# Patient Record
Sex: Female | Born: 1960
Health system: Southern US, Community
[De-identification: ages and names within clinical notes are randomized; demographics above are authoritative.]

## PROBLEM LIST (undated history)

## (undated) DIAGNOSIS — M1990 Unspecified osteoarthritis, unspecified site: Secondary | ICD-10-CM

## (undated) DIAGNOSIS — Z9889 Other specified postprocedural states: Secondary | ICD-10-CM

## (undated) DIAGNOSIS — F329 Major depressive disorder, single episode, unspecified: Secondary | ICD-10-CM

## (undated) DIAGNOSIS — T50912A Poisoning by multiple unspecified drugs, medicaments and biological substances, intentional self-harm, initial encounter: Secondary | ICD-10-CM

## (undated) DIAGNOSIS — R112 Nausea with vomiting, unspecified: Secondary | ICD-10-CM

## (undated) DIAGNOSIS — J449 Chronic obstructive pulmonary disease, unspecified: Secondary | ICD-10-CM

## (undated) DIAGNOSIS — F32A Depression, unspecified: Secondary | ICD-10-CM

## (undated) DIAGNOSIS — T4145XA Adverse effect of unspecified anesthetic, initial encounter: Secondary | ICD-10-CM

## (undated) DIAGNOSIS — T8859XA Other complications of anesthesia, initial encounter: Secondary | ICD-10-CM

## (undated) DIAGNOSIS — F1011 Alcohol abuse, in remission: Secondary | ICD-10-CM

## (undated) DIAGNOSIS — F419 Anxiety disorder, unspecified: Secondary | ICD-10-CM

## (undated) DIAGNOSIS — K219 Gastro-esophageal reflux disease without esophagitis: Secondary | ICD-10-CM

## (undated) DIAGNOSIS — IMO0001 Reserved for inherently not codable concepts without codable children: Secondary | ICD-10-CM

## (undated) DIAGNOSIS — G610 Guillain-Barre syndrome: Secondary | ICD-10-CM

## (undated) DIAGNOSIS — I10 Essential (primary) hypertension: Secondary | ICD-10-CM

## (undated) HISTORY — PX: TUBAL LIGATION: SHX77

## (undated) HISTORY — PX: TONSILLECTOMY: SUR1361

## (undated) HISTORY — PX: LAPAROSCOPY: SHX197

## (undated) HISTORY — DX: Essential (primary) hypertension: I10

## (undated) HISTORY — DX: Unspecified osteoarthritis, unspecified site: M19.90

## (undated) HISTORY — DX: Chronic obstructive pulmonary disease, unspecified: J44.9

## (undated) HISTORY — DX: Gastro-esophageal reflux disease without esophagitis: K21.9

## (undated) HISTORY — PX: OTHER SURGICAL HISTORY: SHX169

## (undated) HISTORY — DX: Reserved for inherently not codable concepts without codable children: IMO0001

---

## 1997-09-30 ENCOUNTER — Ambulatory Visit (HOSPITAL_BASED_OUTPATIENT_CLINIC_OR_DEPARTMENT_OTHER): Admission: RE | Admit: 1997-09-30 | Discharge: 1997-09-30 | Payer: Self-pay | Admitting: Surgery

## 1998-05-23 ENCOUNTER — Encounter: Admission: RE | Admit: 1998-05-23 | Discharge: 1998-06-19 | Payer: Self-pay | Admitting: Family Medicine

## 1999-02-02 ENCOUNTER — Emergency Department (HOSPITAL_COMMUNITY): Admission: EM | Admit: 1999-02-02 | Discharge: 1999-02-02 | Payer: Self-pay | Admitting: Emergency Medicine

## 2004-03-05 ENCOUNTER — Encounter: Admission: RE | Admit: 2004-03-05 | Discharge: 2004-03-05 | Payer: Self-pay | Admitting: Family Medicine

## 2008-03-06 ENCOUNTER — Encounter: Admission: RE | Admit: 2008-03-06 | Discharge: 2008-03-06 | Payer: Self-pay | Admitting: Internal Medicine

## 2009-02-24 ENCOUNTER — Emergency Department (HOSPITAL_COMMUNITY): Admission: EM | Admit: 2009-02-24 | Discharge: 2009-02-24 | Payer: Self-pay | Admitting: Family Medicine

## 2009-09-28 ENCOUNTER — Emergency Department (HOSPITAL_COMMUNITY): Admission: EM | Admit: 2009-09-28 | Discharge: 2009-09-29 | Payer: Self-pay | Admitting: Emergency Medicine

## 2009-09-29 ENCOUNTER — Ambulatory Visit: Payer: Self-pay | Admitting: Psychiatry

## 2009-09-29 ENCOUNTER — Inpatient Hospital Stay (HOSPITAL_COMMUNITY): Admission: RE | Admit: 2009-09-29 | Discharge: 2009-10-05 | Payer: Self-pay | Admitting: Psychiatry

## 2010-04-02 ENCOUNTER — Emergency Department (HOSPITAL_COMMUNITY): Admission: EM | Admit: 2010-04-02 | Discharge: 2010-04-02 | Payer: Self-pay | Admitting: Emergency Medicine

## 2010-06-07 ENCOUNTER — Emergency Department (HOSPITAL_COMMUNITY)
Admission: EM | Admit: 2010-06-07 | Discharge: 2010-06-07 | Payer: Self-pay | Source: Home / Self Care | Admitting: Emergency Medicine

## 2010-06-16 ENCOUNTER — Emergency Department (HOSPITAL_COMMUNITY)
Admission: EM | Admit: 2010-06-16 | Discharge: 2010-06-16 | Payer: Self-pay | Source: Home / Self Care | Admitting: Emergency Medicine

## 2010-06-20 ENCOUNTER — Emergency Department (HOSPITAL_COMMUNITY)
Admission: EM | Admit: 2010-06-20 | Discharge: 2010-06-20 | Payer: Self-pay | Source: Home / Self Care | Admitting: Emergency Medicine

## 2010-09-09 LAB — DIFFERENTIAL
Basophils Relative: 1 % (ref 0–1)
Lymphs Abs: 2 10*3/uL (ref 0.7–4.0)
Monocytes Absolute: 0.8 10*3/uL (ref 0.1–1.0)
Monocytes Relative: 11 % (ref 3–12)
Neutro Abs: 3.7 10*3/uL (ref 1.7–7.7)
Neutrophils Relative %: 56 % (ref 43–77)

## 2010-09-09 LAB — CBC
MCHC: 33.8 g/dL (ref 30.0–36.0)
Platelets: 247 10*3/uL (ref 150–400)
RDW: 12.5 % (ref 11.5–15.5)

## 2010-09-09 LAB — RAPID URINE DRUG SCREEN, HOSP PERFORMED
Amphetamines: NOT DETECTED
Benzodiazepines: POSITIVE — AB
Cocaine: POSITIVE — AB

## 2010-09-09 LAB — URINALYSIS, ROUTINE W REFLEX MICROSCOPIC
Glucose, UA: NEGATIVE mg/dL
Hgb urine dipstick: NEGATIVE
Protein, ur: NEGATIVE mg/dL
pH: 6 (ref 5.0–8.0)

## 2010-09-09 LAB — COMPREHENSIVE METABOLIC PANEL
ALT: 15 U/L (ref 0–35)
Albumin: 3.9 g/dL (ref 3.5–5.2)
Alkaline Phosphatase: 88 U/L (ref 39–117)
BUN: 3 mg/dL — ABNORMAL LOW (ref 6–23)
Calcium: 8.9 mg/dL (ref 8.4–10.5)
Potassium: 3.6 mEq/L (ref 3.5–5.1)
Sodium: 135 mEq/L (ref 135–145)
Total Protein: 7.3 g/dL (ref 6.0–8.3)

## 2010-09-09 LAB — ETHANOL: Alcohol, Ethyl (B): 140 mg/dL — ABNORMAL HIGH (ref 0–10)

## 2010-09-09 LAB — URINE MICROSCOPIC-ADD ON

## 2010-09-09 LAB — ACETAMINOPHEN LEVEL: Acetaminophen (Tylenol), Serum: <10 ug/mL — ABNORMAL LOW (ref 10–30)

## 2011-02-19 ENCOUNTER — Other Ambulatory Visit: Payer: Self-pay | Admitting: Obstetrics & Gynecology

## 2011-02-19 DIAGNOSIS — Z1231 Encounter for screening mammogram for malignant neoplasm of breast: Secondary | ICD-10-CM

## 2011-02-23 ENCOUNTER — Other Ambulatory Visit: Payer: Self-pay | Admitting: Obstetrics and Gynecology

## 2011-02-23 ENCOUNTER — Ambulatory Visit (INDEPENDENT_AMBULATORY_CARE_PROVIDER_SITE_OTHER): Payer: Self-pay | Admitting: *Deleted

## 2011-02-23 ENCOUNTER — Ambulatory Visit (HOSPITAL_COMMUNITY): Payer: Self-pay

## 2011-02-23 ENCOUNTER — Encounter (HOSPITAL_COMMUNITY): Payer: Self-pay

## 2011-02-23 VITALS — BP 130/80 | HR 92 | Temp 97.9°F | Resp 20 | Ht 67.0 in | Wt 194.1 lb

## 2011-02-23 DIAGNOSIS — N63 Unspecified lump in unspecified breast: Secondary | ICD-10-CM

## 2011-02-23 DIAGNOSIS — Z01419 Encounter for gynecological examination (general) (routine) without abnormal findings: Secondary | ICD-10-CM

## 2011-02-23 DIAGNOSIS — N6323 Unspecified lump in the left breast, lower outer quadrant: Secondary | ICD-10-CM

## 2011-02-23 DIAGNOSIS — N949 Unspecified condition associated with female genital organs and menstrual cycle: Secondary | ICD-10-CM

## 2011-02-23 DIAGNOSIS — R350 Frequency of micturition: Secondary | ICD-10-CM

## 2011-02-23 DIAGNOSIS — N644 Mastodynia: Secondary | ICD-10-CM

## 2011-02-23 DIAGNOSIS — R1031 Right lower quadrant pain: Secondary | ICD-10-CM

## 2011-02-23 DIAGNOSIS — R102 Pelvic and perineal pain: Secondary | ICD-10-CM

## 2011-02-23 DIAGNOSIS — Z8742 Personal history of other diseases of the female genital tract: Secondary | ICD-10-CM

## 2011-02-23 DIAGNOSIS — N6313 Unspecified lump in the right breast, lower outer quadrant: Secondary | ICD-10-CM

## 2011-02-23 LAB — POCT URINALYSIS DIP (DEVICE)
Bilirubin Urine: NEGATIVE
Glucose, UA: NEGATIVE mg/dL
Hgb urine dipstick: NEGATIVE
Nitrite: NEGATIVE
Specific Gravity, Urine: 1.02 (ref 1.005–1.030)

## 2011-02-23 NOTE — Progress Notes (Signed)
Patient c/o RLQ abdominal pain and tenderness. Patient c/o feeling lump in right breast and pain. Patient c/o of pain on urination and increase in frequency of urination. Patient c/o pain during intercourse and hasn't had intercourse in 1 year.  Pap Smear:    Pap Smear Completed today.  Last Pap in 2008. History of abnormal Pap Smear in past that required a repeat Pap Smear.   Physical exam: Breasts      Patients breast appeared to be symmetric. Left breast slightly larger which is normal per patient. No skin abnormalities and no nipple retraction seen on exam. No lymphadenopathy noted on exam. No nipple discharge on exam or per patient. Did palpate a small pea sized area around 4 o'clock in left breast and felt small slightly smaller than a pea in right breast around 7 o'clock. Pain and tenderness was stated when palpating right breast. Patient has a family history of breast cancer. Mother was diagnosed in her 42's and aunt in her 67's. [facility], [facility] for 3:20pm to follow up. Patient has never had a mammogram and only does BSE occasionally.      Pelvic/Bimanual   Ext Genitalia No lesions, No swelling and No discharge observed on exam.        Vagina      Vaginal color pink and of normal texture. No lesions or discharge observed on exam.    Cervix Cervix present and pink. Cervix normal texture.          Uterus Uterus present and within normal size on palpation.         Adnexae      Ovaries Present and tenderness noted on right side on palpation. Patient c/o history of right ovarian cysts. Patient states she has shooting pains on the RLQ. Ovaries not palpated. Ultrasound is scheduled for Friday, February 26, 2011 at 10:30am for follow up. Will schedule patient follow up appointment with one of the physicians from [facility] to follow up.    Rectovaginal  Noted hemorrhoids on exam. No rectal exam  completed.  U/A completed and trace of leukocytes and trace of ketones seen. Result shown to Dr. Penne Lash. No further follow up is needed for urinary complaints.

## 2011-02-23 NOTE — Patient Instructions (Signed)
Education material given on how to do self breast exam and about Pap results.  Patient scheduled for diagnostic mammogram at the [facility] for February 25, 2011 for 3:20pm related to small lump found in right and left breast. Ultrasound will be done if needed.   U/A completed on patient related to c/o burning when urinating and frequent urination. Patient given result WNL.  U/S scheduled for Friday, February 26, 2011 at 10:30am to follow up for c/o RLQ pain and history of ovarian cysts.  Patient given appointments and all above information and verbalized understanding.

## 2011-02-25 ENCOUNTER — Ambulatory Visit
Admission: RE | Admit: 2011-02-25 | Discharge: 2011-02-25 | Disposition: A | Discharge status: Home / Self Care | Payer: No Typology Code available for payment source | Source: Ambulatory Visit | Attending: Obstetrics and Gynecology | Admitting: Obstetrics and Gynecology

## 2011-02-25 DIAGNOSIS — N644 Mastodynia: Secondary | ICD-10-CM

## 2011-02-25 DIAGNOSIS — N63 Unspecified lump in unspecified breast: Secondary | ICD-10-CM

## 2011-02-26 ENCOUNTER — Ambulatory Visit (HOSPITAL_COMMUNITY): Admission: RE | Admit: 2011-02-26 | Payer: Self-pay | Source: Ambulatory Visit

## 2011-03-01 ENCOUNTER — Ambulatory Visit (HOSPITAL_COMMUNITY)
Admission: RE | Admit: 2011-03-01 | Discharge: 2011-03-01 | Disposition: A | Discharge status: Home / Self Care | Payer: Self-pay | Source: Ambulatory Visit | Attending: Obstetrics and Gynecology | Admitting: Obstetrics and Gynecology

## 2011-03-01 DIAGNOSIS — Z8742 Personal history of other diseases of the female genital tract: Secondary | ICD-10-CM

## 2011-03-01 DIAGNOSIS — R1031 Right lower quadrant pain: Secondary | ICD-10-CM | POA: Insufficient documentation

## 2011-03-03 ENCOUNTER — Telehealth: Payer: Self-pay | Admitting: *Deleted

## 2011-03-05 NOTE — Telephone Encounter (Signed)
Called patient 03/03/11 at 12:15pm and gave patient results from her ultrasound, mammogram and pap smear. Told patient both her mammogram and pap smear were normal. The lumps palpated on breasts were fatty areas and are normal per the diagnostic mammogram. Told her she will need her next mammogram in 1 year and her next pap smear in 3 years. Per the state guidelines she is good for 3 years. Told patient since she has had an abnormal pap smear in the past she can come to one of our free pap smear screenings to have a pap smear prior to 3 years if she would like to get a pap smear before 3 years. Told her we offer our free pap smear screenings throughout the year at different locations. Talked to patient about her ultrasound result. Her result was normal. Told her I had spoken with Dr. Jolayne Panther and that no further follow up is needed since nothing showed on the ultrasound. Told patient to follow up with her primary care doctor that something other than a GYN problem may be causing the pain. Patient stated she has a history of ovarian cysts and told her she could have had one that ruptured and resolved itself prior to the ultrasound. Advised her that if the pain gets worse or continues that she will need to follow up. If she doesn't have a physician or if the pain gets worse then she can go to the emergency room to follow up. Let patient know if she is still eligible for [facility] to call to schedule an appointment in 1 year. Told patient if she has any questions to feel free to call me. Patient verbalized understanding. Will send patient a letter with results.

## 2011-03-23 ENCOUNTER — Encounter: Payer: Self-pay | Admitting: *Deleted

## 2011-04-02 ENCOUNTER — Encounter: Payer: Self-pay | Admitting: Obstetrics and Gynecology

## 2011-10-31 ENCOUNTER — Emergency Department (HOSPITAL_COMMUNITY): Payer: Self-pay

## 2011-10-31 ENCOUNTER — Emergency Department (HOSPITAL_COMMUNITY)
Admission: EM | Admit: 2011-10-31 | Discharge: 2011-10-31 | Disposition: A | Discharge status: Home / Self Care | Payer: Self-pay | Attending: Emergency Medicine | Admitting: Emergency Medicine

## 2011-10-31 ENCOUNTER — Encounter (HOSPITAL_COMMUNITY): Payer: Self-pay

## 2011-10-31 DIAGNOSIS — F1010 Alcohol abuse, uncomplicated: Secondary | ICD-10-CM | POA: Insufficient documentation

## 2011-10-31 DIAGNOSIS — S8000XA Contusion of unspecified knee, initial encounter: Secondary | ICD-10-CM | POA: Insufficient documentation

## 2011-10-31 DIAGNOSIS — Z79899 Other long term (current) drug therapy: Secondary | ICD-10-CM | POA: Insufficient documentation

## 2011-10-31 DIAGNOSIS — S8002XA Contusion of left knee, initial encounter: Secondary | ICD-10-CM

## 2011-10-31 DIAGNOSIS — F341 Dysthymic disorder: Secondary | ICD-10-CM | POA: Insufficient documentation

## 2011-10-31 DIAGNOSIS — W19XXXA Unspecified fall, initial encounter: Secondary | ICD-10-CM | POA: Insufficient documentation

## 2011-10-31 HISTORY — DX: Depression, unspecified: F32.A

## 2011-10-31 HISTORY — DX: Major depressive disorder, single episode, unspecified: F32.9

## 2011-10-31 HISTORY — DX: Anxiety disorder, unspecified: F41.9

## 2011-10-31 NOTE — ED Notes (Signed)
Per PT[facility]- pt presents with ETOH-  Admits to falling and c/o of left knee pain- denies neck and back pain

## 2011-10-31 NOTE — Discharge Instructions (Signed)
Your x-rays of your knee did not show any broken bones or dislocations. Use rest, ice, compression and elevation to help with swelling and pain your knee. It is recommended that you reduce your alcohol intake. If you feel you need help with your alcohol abuse you may return at any time or use the resource guide below for additional help.   Alcohol Problems Most adults who drink alcohol drink in moderation (not a lot) are at low risk for developing problems related to their drinking. However, all drinkers, including low-risk drinkers, should know about the health risks connected with drinking alcohol. RECOMMENDATIONS FOR LOW-RISK DRINKING  Drink in moderation. Moderate drinking is defined as follows:   Men - no more than 2 drinks per day.   Nonpregnant women - no more than 1 drink per day.   Over age 30 - no more than 1 drink per day.  A standard drink is 12 grams of pure alcohol, which is equal to a 12 ounce bottle of beer or wine cooler, a 5 ounce glass of wine, or 1.5 ounces of distilled spirits (such as whiskey, brandy, vodka, or rum).  ABSTAIN FROM (DO NOT DRINK) ALCOHOL:  When pregnant or considering pregnancy.   When taking a medication that interacts with alcohol.   If you are alcohol dependent.   A medical condition that prohibits drinking alcohol (such as ulcer, liver disease, or heart disease).  DISCUSS WITH YOUR CAREGIVER:  If you are at risk for coronary heart disease, discuss the potential benefits and risks of alcohol use: Light to moderate drinking is associated with lower rates of coronary heart disease in certain populations (for example, men over age 75 and postmenopausal women). Infrequent or nondrinkers are advised not to begin light to moderate drinking to reduce the risk of coronary heart disease so as to avoid creating an alcohol-related problem. Similar protective effects can likely be gained through proper diet and exercise.   Women and the elderly have smaller  amounts of body water than men. As a result women and the elderly achieve a higher blood alcohol concentration after drinking the same amount of alcohol.   Exposing a fetus to alcohol can cause a broad range of birth defects referred to as Fetal Alcohol Syndrome (FAS) or Alcohol-Related Birth Defects (ARBD). Although FAS/ARBD is connected with excessive alcohol consumption during pregnancy, studies also have reported neurobehavioral problems in infants born to mothers reporting drinking an average of 1 drink per day during pregnancy.   Heavier drinking (the consumption of more than 4 drinks per occasion by men and more than 3 drinks per occasion by women) impairs learning (cognitive) and psychomotor functions and increases the risk of alcohol-related problems, including accidents and injuries.  CAGE QUESTIONS:   Have you ever felt that you should Cut down on your drinking?   Have people Annoyed you by criticizing your drinking?   Have you ever felt bad or Guilty about your drinking?   Have you ever had a drink first thing in the morning to steady your nerves or get rid of a hangover (Eye opener)?  If you answered positively to any of these questions: You may be at risk for alcohol-related problems if alcohol consumption is:   Men: Greater than 14 drinks per week or more than 4 drinks per occasion.   Women: Greater than 7 drinks per week or more than 3 drinks per occasion.  Do you or your family have a medical history of alcohol-related problems, such as:  Blackouts.   Sexual dysfunction.   Depression.   Trauma.   Liver dysfunction.   Sleep disorders.   Hypertension.   Chronic abdominal pain.   Has your drinking ever caused you problems, such as problems with your family, problems with your work (or school) performance, or accidents/injuries?   Do you have a compulsion to drink or a preoccupation with drinking?   Do you have poor control or are you unable to stop drinking once  you have started?   Do you have to drink to avoid withdrawal symptoms?   Do you have problems with withdrawal such as tremors, nausea, sweats, or mood disturbances?   Does it take more alcohol than in the past to get you high?   Do you feel a strong urge to drink?   Do you change your plans so that you can have a drink?   Do you ever drink in the morning to relieve the shakes or a hangover?  If you have answered a number of the previous questions positively, it may be time for you to talk to your caregivers, family, and friends and see if they think you have a problem. Alcoholism is a chemical dependency that keeps getting worse and will eventually destroy your health and relationships. Many alcoholics end up dead, impoverished, or in prison. This is often the end result of all chemical dependency.  Do not be discouraged if you are not ready to take action immediately.   Decisions to change behavior often involve up and down desires to change and feeling like you cannot decide.   Try to think more seriously about your drinking behavior.   Think of the reasons to quit.  WHERE TO GO FOR ADDITIONAL INFORMATION   The National Institute on Alcohol Abuse and Alcoholism (NIAAA)www.niaaa.nih.gov   [employer] on Alcoholism and Drug Dependence (NCADD)www.ncadd.org   American Society of Addiction Medicine (ASAM)www.https://anderson-johnson.com/  Document Released: 06/07/2005 Document Revised: 05/27/2011 Document Reviewed: 01/24/2008 [facility] Patient Information 2012 [place], [place].  Contusion A contusion is a deep bruise. Contusions are the result of an injury that caused bleeding under the skin. The contusion may turn blue, purple, or yellow. Minor injuries will give you a painless contusion, but more severe contusions may stay painful and swollen for a few weeks.  CAUSES  A contusion is usually caused by a blow, trauma, or direct force to an area of the body. SYMPTOMS   Swelling and redness of the  injured area.   Bruising of the injured area.   Tenderness and soreness of the injured area.   Pain.  DIAGNOSIS  The diagnosis can be made by taking a history and physical exam. An X-ray, CT scan, or MRI may be needed to determine if there were any associated injuries, such as fractures. TREATMENT  Specific treatment will depend on what area of the body was injured. In general, the best treatment for a contusion is resting, icing, elevating, and applying cold compresses to the injured area. Over-the-counter medicines may also be recommended for pain control. Ask your caregiver what the best treatment is for your contusion. HOME CARE INSTRUCTIONS   Put ice on the injured area.   Put ice in a plastic bag.   Place a towel between your skin and the bag.   Leave the ice on for 15 to 20 minutes, 3 to 4 times a day.   Only take over-the-counter or prescription medicines for pain, discomfort, or fever as directed by your caregiver. Your caregiver may  recommend avoiding anti-inflammatory medicines (aspirin, ibuprofen, and naproxen) for 48 hours because these medicines may increase bruising.   Rest the injured area.   If possible, elevate the injured area to reduce swelling.  SEEK IMMEDIATE MEDICAL CARE IF:   You have increased bruising or swelling.   You have pain that is getting worse.   Your swelling or pain is not relieved with medicines.  MAKE SURE YOU:   Understand these instructions.   Will watch your condition.   Will get help right away if you are not doing well or get worse.  Document Released: 03/17/2005 Document Revised: 05/27/2011 Document Reviewed: 04/12/2011 [facility] Patient Information 2012 [place], [place].     RESOURCE GUIDE  Dental Problems  Patients with Medicaid: [facility] [phone]                                           [phone] Phone:  [phone]                                                   Phone:  [phone]  If unable to pay or uninsured, contact:  [facility] or [facility]. to become qualified for the adult dental clinic.  Chronic Pain Problems Contact [facility]  [phone] Patients need to be referred by their primary care doctor.  Insufficient Money for Medicine Contact [employer] Way:  call "211" or [facility] [phone].  No Primary Care Doctor Call [facility]  [phone] Other agencies that provide inexpensive medical care    [facility]  [phone]    [facility]  [phone]    [facility]  [phone]    [facility]  [phone]    [facility]  [phone]    [facility]  [phone]  Psychological Services [facility]  [phone] [facility]  [phone] [facility] Mental Health   [phone] (emergency services ([phone])  Substance Abuse Resources [facility]  [phone] [facility] [phone] [facility] [phone] Floydene Flock [phone] Residential & Outpatient Substance Abuse Program  ([phone]  Abuse/Neglect [facility] Child Abuse Hotline [phone] [facility] Child Abuse Hotline (574)654-4790 (After Hours)  Emergency Shelter [facility] [phone]  M[facility] ([phone] [facility] [phone]  MRSA Hotline #:   (380)675-2256    [facility] Resources  [facility]     [employer]                          [facility] Dept. [address] [place]                       [address]      [address]  [address]  Cristobal Goldmann Phone:  [phone]                                   Phone:  [phone]                 Phone:  [phone]  [facility] Mental  Health Phone:  [phone]  [facility] Child Abuse Hotline 8596999291 [phone] (After Hours)

## 2011-10-31 NOTE — ED Provider Notes (Signed)
History     CSN: [id number]  Arrival date & time 10/31/11  0105   First MD Initiated Contact with Patient 10/31/11 0325      Chief Complaint  Patient presents with  . Alcohol Intoxication  . Fall  . Knee Pain    HPI  History provided by the patient. History is limited given alcohol intoxication. Patient is a 51 year old female with history of anxiety depression reports heavy alcohol use last evening. Patient presents complaining of persistent left knee pains. She reports falling 2-3 days ago directly on her knee. Since that time she complained of waxing and waning pain to the knee and swelling. Pain swelling is worse with increased use and movements. Patient denies having any additional injury. She denies any other aggravating or alleviating factors. She denies any associated symptoms.     Past Medical History  Diagnosis Date  . Depression   . Anxiety     No past surgical history on file.  No family history on file.  History  Substance Use Topics  . Smoking status: Current Everyday Smoker  . Smokeless tobacco: Not on file  . Alcohol Use: Yes    OB History    Grav Para Term Preterm Abortions TAB SAB Ect Mult Living                  Review of Systems  Unable to perform ROS: Other  HENT: Negative for neck pain.   Respiratory: Negative for cough and shortness of breath.   Musculoskeletal: Positive for joint swelling.  Neurological: Negative for weakness, numbness and headaches.    Allergies  Review of patient's allergies indicates no known allergies.  Home Medications   Current Outpatient Rx  Name Route Sig Dispense Refill  . AMLODIPINE BESYLATE 5 MG PO TABS Oral Take 5 mg by mouth daily.    . BUSPIRONE HCL 15 MG PO TABS Oral Take 15 mg by mouth 2 (two) times daily.    Marland Kitchen FLUOXETINE HCL 20 MG PO CAPS Oral Take 20 mg by mouth daily.    Marland Kitchen GABAPENTIN 400 MG PO CAPS Oral Take 400 mg by mouth 3 (three) times daily.    . TRAZODONE HCL 100 MG PO TABS Oral Take 250  mg by mouth at bedtime.    Marland Kitchen ZOLPIDEM TARTRATE 10 MG PO TABS Oral Take 10 mg by mouth at bedtime as needed.      BP 125/74  Pulse 87  Temp(Src) 97.5 F (36.4 C) (Oral)  Resp 22  Wt 190 lb (86.183 kg)  SpO2 100%  Physical Exam  Nursing note and vitals reviewed. Constitutional: She is oriented to person, place, and time. She appears well-developed and well-nourished. No distress.  HENT:  Head: Normocephalic and atraumatic.       Breath smells of alcohol  Cardiovascular: Normal rate and regular rhythm.   Pulmonary/Chest: Effort normal and breath sounds normal.  Abdominal: Soft. She exhibits no distension. There is no tenderness.  Musculoskeletal:       Mild swelling to left knee. Pain with range of motion but otherwise full. Normal distal sensations, dorsal pedal pulses and movement toes and ankle. Skin without erythema. Old and well healing abrasions over the surface of the knee.  Neurological: She is alert and oriented to person, place, and time.  Skin: Skin is warm and dry. No rash noted.  Psychiatric: She has a normal mood and affect. Her behavior is normal.    ED Course  Procedures  Dg Knee Complete  4 Views Left  10/31/2011  *RADIOLOGY REPORT*  Clinical Data: Anterior knee pain and abrasions after fall.  LEFT KNEE - COMPLETE 4+ VIEW  Comparison: None.  Findings: The left knee appears intact.  No evidence of acute fracture or subluxation.  No significant effusion.  Benign- appearing sclerosis in the proximal tibia.  No focal bone destruction.  Bone cortex and trabecular architecture appear intact.  IMPRESSION: No acute bony abnormalities.  Original Report Authenticated By: Marlon Pel, M.D.     1. Alcohol abuse   2. Contusion of left knee       MDM  Patient fell one to 2 days ago. Has old appearing abrasions to left knee. Complains of pain to left knee. Admits to heavy alcohol use. Has history of same.        Angus Seller, [place] 11/01/11 [phone]

## 2011-10-31 NOTE — ED Notes (Signed)
Pt states she fell on the floor at home, sustained abrasions to BLE, knee. She states she slipped from the moisture on flip flops. Alert and oriented x 4.

## 2011-10-31 NOTE — ED Notes (Signed)
[id number]<AV> Expected date:<BR> Expected time:<BR> Means of arrival:<BR> Comments:<BR> Ems, etoh

## 2011-11-01 NOTE — ED Provider Notes (Signed)
Medical screening examination/treatment/procedure(s) were performed by non-physician practitioner and as supervising physician I was immediately available for consultation/collaboration.   Hanley Seamen, MD 11/01/11 [phone]

## 2011-12-31 ENCOUNTER — Emergency Department (HOSPITAL_COMMUNITY)
Admission: EM | Admit: 2011-12-31 | Discharge: 2011-12-31 | Disposition: A | Discharge status: Home / Self Care | Payer: Self-pay | Attending: Emergency Medicine | Admitting: Emergency Medicine

## 2011-12-31 ENCOUNTER — Encounter (HOSPITAL_COMMUNITY): Payer: Self-pay

## 2011-12-31 ENCOUNTER — Emergency Department (HOSPITAL_COMMUNITY): Payer: Self-pay

## 2011-12-31 DIAGNOSIS — Z79899 Other long term (current) drug therapy: Secondary | ICD-10-CM | POA: Insufficient documentation

## 2011-12-31 DIAGNOSIS — J209 Acute bronchitis, unspecified: Secondary | ICD-10-CM | POA: Insufficient documentation

## 2011-12-31 DIAGNOSIS — R509 Fever, unspecified: Secondary | ICD-10-CM | POA: Insufficient documentation

## 2011-12-31 DIAGNOSIS — J440 Chronic obstructive pulmonary disease with acute lower respiratory infection: Secondary | ICD-10-CM | POA: Insufficient documentation

## 2011-12-31 DIAGNOSIS — F3289 Other specified depressive episodes: Secondary | ICD-10-CM | POA: Insufficient documentation

## 2011-12-31 DIAGNOSIS — F17200 Nicotine dependence, unspecified, uncomplicated: Secondary | ICD-10-CM | POA: Insufficient documentation

## 2011-12-31 DIAGNOSIS — R0602 Shortness of breath: Secondary | ICD-10-CM | POA: Insufficient documentation

## 2011-12-31 DIAGNOSIS — R05 Cough: Secondary | ICD-10-CM | POA: Insufficient documentation

## 2011-12-31 DIAGNOSIS — R5381 Other malaise: Secondary | ICD-10-CM | POA: Insufficient documentation

## 2011-12-31 DIAGNOSIS — F329 Major depressive disorder, single episode, unspecified: Secondary | ICD-10-CM | POA: Insufficient documentation

## 2011-12-31 DIAGNOSIS — R059 Cough, unspecified: Secondary | ICD-10-CM | POA: Insufficient documentation

## 2011-12-31 DIAGNOSIS — R062 Wheezing: Secondary | ICD-10-CM | POA: Insufficient documentation

## 2011-12-31 MED ORDER — BENZONATATE 200 MG PO CAPS: 200.0000 mg | ORAL_CAPSULE | Freq: Three times a day (TID) | ORAL | Status: AC | PRN

## 2011-12-31 MED ORDER — AZITHROMYCIN 250 MG PO TABS
500.0000 mg | ORAL_TABLET | Freq: Once | ORAL | Status: AC
  Administered 2011-12-31: 500 mg via ORAL
  Filled 2011-12-31: qty 2

## 2011-12-31 MED ORDER — PREDNISONE 20 MG PO TABS
60.0000 mg | ORAL_TABLET | Freq: Once | ORAL | Status: AC
  Administered 2011-12-31: 60 mg via ORAL
  Filled 2011-12-31: qty 3

## 2011-12-31 MED ORDER — PREDNISONE 20 MG PO TABS: 60.0000 mg | ORAL_TABLET | Freq: Every day | ORAL | Status: DC

## 2011-12-31 MED ORDER — AZITHROMYCIN 250 MG PO TABS: ORAL_TABLET | ORAL | Status: AC

## 2011-12-31 MED ORDER — ALBUTEROL SULFATE HFA 108 (90 BASE) MCG/ACT IN AERS
2.0000 | INHALATION_SPRAY | Freq: Once | RESPIRATORY_TRACT | Status: AC
  Administered 2011-12-31: 2 via RESPIRATORY_TRACT
  Filled 2011-12-31: qty 6.7

## 2011-12-31 NOTE — ED Notes (Signed)
Pt reports productive cough with "gray" colored sputum.  Reports has had low grade fever.

## 2011-12-31 NOTE — ED Notes (Signed)
Pt drowsy and appears a little pale.  PT says is drowsy from the neurontin she takes.

## 2012-01-01 NOTE — ED Provider Notes (Signed)
Medical screening examination/treatment/procedure(s) were performed by non-physician practitioner and as supervising physician I was immediately available for consultation/collaboration. Aletheia Tangredi, MD, FACEP   Latorie Montesano L Aedan Geimer, MD 01/01/12 0210 

## 2012-01-01 NOTE — ED Provider Notes (Signed)
History     CSN: [id number]  Arrival date & time 12/31/11  1516   First MD Initiated Contact with Patient 12/31/11 1558      Chief Complaint  Patient presents with  . Cough  . Fever    (Consider location/radiation/quality/duration/timing/severity/associated sxs/prior treatment) HPI Comments: Catherine Butler presents with a one week history of cough which is productive of gray colored sputum along with intermittent episodes of wheezing and fever to 100.5  She does have a history of chronic bronchitis and continues to smoke cigarettes 1 ppd.  She has used her albuterol inhaler,  Most recently just before arrival,  But believes it is empty and is not working.  She denies chest pain,  Peripheral edema,  Headache,  Nausea or vomiting.  She denies hemoptysis.  She reports she is fatigued as she was up all night last night coughing.  Patient is a 51 y.o. female presenting with fever. The history is provided by the patient.  Fever Primary symptoms of the febrile illness include fever, cough, wheezing and shortness of breath. Primary symptoms do not include headaches, abdominal pain, nausea, arthralgias or rash.    Past Medical History  Diagnosis Date  . Depression   . Anxiety     Past Surgical History  Procedure Date  . Tonsillectomy   . Laparoscopy     No family history on file.  History  Substance Use Topics  . Smoking status: Current Everyday Smoker  . Smokeless tobacco: Not on file  . Alcohol Use: No    OB History    Grav Para Term Preterm Abortions TAB SAB Ect Mult Living                  Review of Systems  Constitutional: Positive for fever.  HENT: Negative for congestion, sore throat and neck pain.   Eyes: Negative.   Respiratory: Positive for cough, shortness of breath and wheezing. Negative for chest tightness.   Cardiovascular: Negative for chest pain, palpitations and leg swelling.  Gastrointestinal: Negative for nausea and abdominal pain.  Genitourinary:  Negative.   Musculoskeletal: Negative for joint swelling and arthralgias.  Skin: Negative.  Negative for rash and wound.  Neurological: Negative for dizziness, weakness, light-headedness, numbness and headaches.  Hematological: Negative.   Psychiatric/Behavioral: Negative.     Allergies  Review of patient's allergies indicates no known allergies.  Home Medications   Current Outpatient Rx  Name Route Sig Dispense Refill  . AMLODIPINE BESYLATE 5 MG PO TABS Oral Take 5 mg by mouth daily.    . AZITHROMYCIN 250 MG PO TABS  One tablet daily for 4 days 4 each 0  . BENZONATATE 200 MG PO CAPS Oral Take 1 capsule (200 mg total) by mouth 3 (three) times daily as needed for cough. 20 capsule 0  . BUSPIRONE HCL 15 MG PO TABS Oral Take 15 mg by mouth 2 (two) times daily.    Marland Kitchen FLUOXETINE HCL 20 MG PO CAPS Oral Take 20 mg by mouth daily.    Marland Kitchen GABAPENTIN 400 MG PO CAPS Oral Take 400 mg by mouth 3 (three) times daily.    Marland Kitchen PREDNISONE 20 MG PO TABS Oral Take 3 tablets (60 mg total) by mouth daily. 12 tablet 0  . TRAZODONE HCL 100 MG PO TABS Oral Take 250 mg by mouth at bedtime.    Marland Kitchen ZOLPIDEM TARTRATE 10 MG PO TABS Oral Take 10 mg by mouth at bedtime as needed.      BP  119/76  Pulse 106  Temp 97.3 F (36.3 C) (Oral)  Resp 20  Ht 5\' 6"  (1.676 m)  Wt 200 lb (90.719 kg)  BMI 32.28 kg/m2  SpO2 100%  Physical Exam  Nursing note and vitals reviewed. Constitutional: She appears well-developed and well-nourished.  HENT:  Head: Normocephalic and atraumatic.  Eyes: Conjunctivae are normal.  Neck: Normal range of motion.  Cardiovascular: Normal rate, regular rhythm, normal heart sounds and intact distal pulses.   Pulmonary/Chest: Effort normal. She has wheezes.       Heard best upper anterior bilateral chest wall.  Abdominal: Soft. Bowel sounds are normal. There is no tenderness.  Musculoskeletal: Normal range of motion. She exhibits no edema and no tenderness.  Neurological: She is alert.  Skin:  Skin is warm and dry.  Psychiatric: She has a normal mood and affect.    ED Course  Procedures (including critical care time)  Labs Reviewed - No data to display Dg Chest 2 View  12/31/2011  *RADIOLOGY REPORT*  Clinical Data: Cough and fever.  CHEST - 2 VIEW  Comparison: PA and lateral chest 06/20/2010.  Findings: Lungs are clear.  No pneumothorax or pleural fluid. Heart size is normal.  No focal bony abnormality.  IMPRESSION: Negative chest.  Original Report Authenticated By: Bernadene Bell. D'ALESSIO, M.D.     1. COPD (chronic obstructive pulmonary disease) with acute bronchitis       MDM  Pt given albuterol mdi,  2 puffs in ed with improvement in wheezing.  Prednisone 60 mg po given, zithromax 500 mg po.  Prescription for remaining zithromax,  4 additional days of prednisone pulse dose, also prescribed tessalon for assistance with cough control. Pt encouraged to f/u with pcp for a recheck of sx this week (sees [facility]).  Return here for recheck sooner for any worsened sx.   Pt with no risk factors for PE,  Sx most c/w acute exacerbation of her known copd.    Date: 12/31/2011  Rate: 105  Rhythm: sinus tachycardia  QRS Axis: normal  Intervals: normal  ST/T Wave abnormalities: normal  Conduction Disutrbances:none  Narrative Interpretation:   Old EKG Reviewed: none available          Burgess Amor, PA 01/01/12 0150

## 2012-03-16 ENCOUNTER — Emergency Department (INDEPENDENT_AMBULATORY_CARE_PROVIDER_SITE_OTHER)
Admission: EM | Admit: 2012-03-16 | Discharge: 2012-03-16 | Disposition: A | Discharge status: Nursing Home (Includes ICF) | Payer: Self-pay | Source: Home / Self Care | Attending: Emergency Medicine | Admitting: Emergency Medicine

## 2012-03-16 ENCOUNTER — Encounter (HOSPITAL_COMMUNITY): Payer: Self-pay | Admitting: Neurology

## 2012-03-16 ENCOUNTER — Emergency Department (HOSPITAL_COMMUNITY)
Admission: EM | Admit: 2012-03-16 | Discharge: 2012-03-16 | Disposition: A | Discharge status: Home / Self Care | Payer: Self-pay | Attending: Emergency Medicine | Admitting: Emergency Medicine

## 2012-03-16 ENCOUNTER — Encounter (HOSPITAL_COMMUNITY): Payer: Self-pay | Admitting: Emergency Medicine

## 2012-03-16 ENCOUNTER — Emergency Department (HOSPITAL_COMMUNITY): Payer: Self-pay

## 2012-03-16 DIAGNOSIS — R42 Dizziness and giddiness: Secondary | ICD-10-CM | POA: Insufficient documentation

## 2012-03-16 DIAGNOSIS — R51 Headache: Secondary | ICD-10-CM | POA: Insufficient documentation

## 2012-03-16 DIAGNOSIS — R112 Nausea with vomiting, unspecified: Secondary | ICD-10-CM | POA: Insufficient documentation

## 2012-03-16 DIAGNOSIS — Z79899 Other long term (current) drug therapy: Secondary | ICD-10-CM | POA: Insufficient documentation

## 2012-03-16 DIAGNOSIS — M542 Cervicalgia: Secondary | ICD-10-CM | POA: Insufficient documentation

## 2012-03-16 DIAGNOSIS — R5381 Other malaise: Secondary | ICD-10-CM | POA: Insufficient documentation

## 2012-03-16 DIAGNOSIS — F341 Dysthymic disorder: Secondary | ICD-10-CM | POA: Insufficient documentation

## 2012-03-16 DIAGNOSIS — R5383 Other fatigue: Secondary | ICD-10-CM | POA: Insufficient documentation

## 2012-03-16 LAB — PROTIME-INR
INR: 0.9 (ref 0.00–1.49)
Prothrombin Time: 12.1 seconds (ref 11.6–15.2)

## 2012-03-16 LAB — CBC
Platelets: 252 10*3/uL (ref 150–400)
RBC: 4.14 MIL/uL (ref 3.87–5.11)
WBC: 6.1 10*3/uL (ref 4.0–10.5)

## 2012-03-16 LAB — POCT I-STAT, CHEM 8
BUN: 8 mg/dL (ref 6–23)
Chloride: 103 mEq/L (ref 96–112)
Potassium: 3.5 mEq/L (ref 3.5–5.1)
Sodium: 137 mEq/L (ref 135–145)

## 2012-03-16 LAB — C-REACTIVE PROTEIN: CRP: <0.5 mg/dL — ABNORMAL LOW (ref <0.60)

## 2012-03-16 LAB — SEDIMENTATION RATE: Sed Rate: 25 mm/hr — ABNORMAL HIGH (ref 0–22)

## 2012-03-16 MED ORDER — METOCLOPRAMIDE HCL 5 MG/ML IJ SOLN
10.0000 mg | INTRAMUSCULAR | Status: AC
  Administered 2012-03-16: 10 mg via INTRAVENOUS
  Filled 2012-03-16: qty 2

## 2012-03-16 MED ORDER — DIPHENHYDRAMINE HCL 50 MG/ML IJ SOLN
25.0000 mg | Freq: Once | INTRAMUSCULAR | Status: AC
  Administered 2012-03-16: 25 mg via INTRAVENOUS
  Filled 2012-03-16: qty 1

## 2012-03-16 MED ORDER — KETOROLAC TROMETHAMINE 30 MG/ML IJ SOLN
30.0000 mg | Freq: Once | INTRAMUSCULAR | Status: AC
  Administered 2012-03-16: 30 mg via INTRAVENOUS
  Filled 2012-03-16: qty 1

## 2012-03-16 MED ORDER — ALBUTEROL SULFATE (5 MG/ML) 0.5% IN NEBU
2.5000 mg | INHALATION_SOLUTION | Freq: Once | RESPIRATORY_TRACT | Status: AC
  Administered 2012-03-16: 2.5 mg via RESPIRATORY_TRACT

## 2012-03-16 MED ORDER — ALBUTEROL SULFATE (5 MG/ML) 0.5% IN NEBU
INHALATION_SOLUTION | RESPIRATORY_TRACT | Status: AC
  Filled 2012-03-16: qty 0.5

## 2012-03-16 MED ORDER — LORAZEPAM 2 MG/ML IJ SOLN
1.0000 mg | Freq: Once | INTRAMUSCULAR | Status: AC
  Administered 2012-03-16: 1 mg via INTRAVENOUS
  Filled 2012-03-16: qty 1

## 2012-03-16 NOTE — ED Notes (Signed)
Patient transported to CT 

## 2012-03-16 NOTE — ED Notes (Signed)
Pt sitting in bed drinking coffee. Reporting h/a has improved. No needs at this time. EDP reporting pt likely going home.

## 2012-03-16 NOTE — ED Provider Notes (Addendum)
History     CSN: [id number]  Arrival date & time 03/16/12  1026   First MD Initiated Contact with Patient 03/16/12 1032      Chief Complaint  Patient presents with  . Headache  . Dizziness    (Consider location/radiation/quality/duration/timing/severity/associated sxs/prior treatment) HPI Comments: Catherine Butler [facility].  She reports having a severe, throbbing headache for the last 4 days.  She states it was initially bitemporal, but is now only along the left temple.  She denies any hearing changes or ear pain.  She denies any trauma.  She reports she also has left eye pain/discomfort as well as nausea and vomiting.  She denies fever, anticoagulant use.  Patient is a 51 y.o. female presenting with headaches. The history is provided by the patient. No language interpreter was used.  Headache  This is a new problem. The current episode started more than 2 days ago. The problem occurs constantly. The headache is associated with activity. The pain is located in the temporal and left unilateral region. The quality of the pain is described as throbbing. The pain is at a severity of 10/10. The pain is severe. The pain does not radiate. Associated symptoms include nausea and vomiting. Pertinent negatives include no anorexia, no fever, no malaise/fatigue, no chest pressure, no near-syncope, no orthopnea, no palpitations, no syncope and no shortness of breath. She has tried NSAIDs for the symptoms. The treatment provided no relief.    Past Medical History  Diagnosis Date  . Depression   . Anxiety     Past Surgical History  Procedure Date  . Tonsillectomy   . Laparoscopy     No family history on file.  History  Substance Use Topics  . Smoking status: Current Every Day Smoker  . Smokeless tobacco: Not on file  . Alcohol Use: No    OB History    Grav Para Term Preterm Abortions TAB SAB Ect Mult Living                  Review of Systems    Constitutional: Positive for activity change, appetite change and fatigue. Negative for fever, malaise/fatigue and diaphoresis.  HENT: Positive for neck pain and voice change. Negative for hearing loss, ear pain, nosebleeds, congestion, sore throat, facial swelling, drooling, trouble swallowing, neck stiffness, postnasal drip, sinus pressure, tinnitus and ear discharge.        Neck pain is chronic and attributed to degenerative disc disease  Eyes: Positive for pain. Negative for photophobia, discharge, redness, itching and visual disturbance.  Respiratory: Negative for cough, chest tightness and shortness of breath.   Cardiovascular: Negative for chest pain, palpitations, orthopnea, leg swelling, syncope and near-syncope.  Gastrointestinal: Positive for nausea and vomiting. Negative for abdominal pain, diarrhea, constipation and anorexia.  Genitourinary: Negative for dysuria, hematuria and flank pain.  Musculoskeletal: Positive for back pain. Negative for myalgias, joint swelling, arthralgias and gait problem.  Skin: Negative.   Neurological: Positive for dizziness, weakness, light-headedness and headaches. Negative for tremors, seizures and syncope.  Hematological: Negative.   Psychiatric/Behavioral: Negative.     Allergies  Review of patient's allergies indicates no known allergies.  Home Medications   Current Outpatient Rx  Name Route Sig Dispense Refill  . AMLODIPINE BESYLATE 5 MG PO TABS Oral Take 5 mg by mouth daily.    . BUSPIRONE HCL 15 MG PO TABS Oral Take 15 mg by mouth 2 (two) times daily.    Marland Kitchen  FLUOXETINE HCL 20 MG PO CAPS Oral Take 20 mg by mouth daily.    Marland Kitchen GABAPENTIN 400 MG PO CAPS Oral Take 400 mg by mouth 3 (three) times daily.    Marland Kitchen PREDNISONE 20 MG PO TABS Oral Take 3 tablets (60 mg total) by mouth daily. 12 tablet 0  . QUETIAPINE FUMARATE 100 MG PO TABS Oral Take 100 mg by mouth at bedtime.    . TRAZODONE HCL 100 MG PO TABS Oral Take 250 mg by mouth at bedtime.    Marland Kitchen  ZOLPIDEM TARTRATE 10 MG PO TABS Oral Take 10 mg by mouth at bedtime as needed.      BP 104/77  Pulse 97  Temp 98.1 F (36.7 C) (Oral)  Resp 20  SpO2 98%  Physical Exam  Nursing note and vitals reviewed. Constitutional: She is oriented to person, place, and time. She appears well-developed and well-nourished. No distress.       Pt appears uncomfortable but not distressed  HENT:  Head: Normocephalic and atraumatic.  Right Ear: External ear normal.  Left Ear: External ear normal.  Nose: Nose normal.  Mouth/Throat: Oropharynx is clear and moist. No oropharyngeal exudate.  Eyes: Conjunctivae normal and EOM are normal. Pupils are equal, round, and reactive to light. Right eye exhibits no discharge, no exudate and no hordeolum. Left eye exhibits no discharge, no exudate and no hordeolum. Right conjunctiva is not injected. Right conjunctiva has no hemorrhage. Left conjunctiva is not injected. Left conjunctiva has no hemorrhage. No scleral icterus. Right eye exhibits normal extraocular motion and no nystagmus. Left eye exhibits normal extraocular motion and no nystagmus. Right pupil is round and reactive. Left pupil is round and reactive. Pupils are equal.  Fundoscopic exam:      The right eye shows no exudate, no hemorrhage and no papilledema.       The left eye shows no exudate, no hemorrhage and no papilledema.  Neck: Normal range of motion. Neck supple. No JVD present. No tracheal deviation present. No thyromegaly present.  Cardiovascular: Normal rate, regular rhythm, normal heart sounds and intact distal pulses.  Exam reveals no gallop and no friction rub.   No murmur heard. Pulmonary/Chest: Effort normal and breath sounds normal. No stridor. No respiratory distress. She has no wheezes. She has no rales. She exhibits no tenderness.  Abdominal: Soft. Bowel sounds are normal. She exhibits no distension. There is no tenderness. There is no rebound and no guarding.  Musculoskeletal: Normal range  of motion. She exhibits no edema and no tenderness.  Lymphadenopathy:    She has no cervical adenopathy.  Neurological: She is alert and oriented to person, place, and time. She has normal strength. She displays no atrophy and no tremor. No cranial nerve deficit. She exhibits normal muscle tone. She displays no seizure activity. GCS eye subscore is 4. GCS verbal subscore is 5. GCS motor subscore is 6. She displays no Babinski's sign on the left side.       No nystagmus, no photophobia, speech is clear and words are well formed  Skin: Skin is warm and dry. No rash noted. She is not diaphoretic. No erythema. No pallor.  Psychiatric: She has a normal mood and affect. Her behavior is normal.    ED Course  Procedures (including critical care time)  Labs Reviewed - No data to display No results found.   No diagnosis found.   Date: 03/16/2012  Rate: 92 bpm  Rhythm: sinus  QRS Axis: normal  Intervals: normal  ST/T Wave abnormalities: nonspecific ST changes inferior  Conduction Disutrbances:none  Narrative Interpretation: no STEMI.  Old EKG Reviewed: no significant changes      MDM  Pt presents as a transfer from the urgent care for evaluation of a severe left temporal headache.  It is associated with left eye pain, nausea, and vomiting.  Pt appears afebrile, no meningeal signs reproduced, NAD.  Pt has no temporal or left-sided facial tenderness on exam.   Will obtain an ESR, CRP, PT/INR, CT of head.  While labs are pending, will administer a headache cocktail.  Will reassess.  If necessary, will perform an LP for CSF analysis.  1305.  Headache is resolved s/p reglan, benadryl, and toradol.  Note mildly elevated ESR.  1530.  Pt stable, NAD.  No clinical evidence of CVA.  On repeat exam, no left-sided facial pain reproduced.  Pt ambulates with a steady gait.  Plan d/c home.    Tobin Chad, MD 03/16/12 1536  Tobin Chad, MD 03/16/12 1536

## 2012-03-16 NOTE — ED Notes (Signed)
Pt c/o headache x2 days and flare up of COPD... Sx include: vomiting, nausea, dizzy, dry cough... Denies: fevers, diarrhea... Takes albuterol w/no relief.

## 2012-03-16 NOTE — ED Notes (Signed)
Pt comes form [facility], at present c/o left sided headache , intermittent slurring speech, neck pain, dizziness. Reports these s/s x 3 days. Also reports n/v. Pt is a x 4. No facial droop or extremity weakness noted. Will continue to monitor.

## 2012-03-16 NOTE — ED Notes (Signed)
Pt reporting h/a x 3 days, with n/v. Pain to left side of head, describes as "sharp". Pt c/o dizziness at this time. Denying any cp or sob. No facial droop noted, moving all extremities. A x 4

## 2012-03-16 NOTE — ED Provider Notes (Signed)
History     CSN: [id number]  Arrival date & time 03/16/12  0847   First MD Initiated Contact with Patient 03/16/12 [phone]      Chief Complaint  Patient presents with  . Headache    (Consider location/radiation/quality/duration/timing/severity/associated sxs/prior treatment) HPI Comments: S. urgent care complaining of a severe headache for 3-4 days, she describes as forehead and bitemporal regions. Sharp and throbbing in character. Later she started vomiting and feeling nauseous and feeling dizzy, to the point that now she is sitting in a wheelchair unable to stand up as a result of the dizziness. Patient describes some severe dry cough, with no nasal congestion sore throat or fevers. She has been using some albuterol at home but with no improvement. Patient denies further symptoms such as numbness, tingling sensation weakness of upper or lower extremities, speech difficulties or changes of facial expression.  " I have had headaches before but not like this one this is the worst headache have felt".  The history is provided by the patient.    Past Medical History  Diagnosis Date  . Depression   . Anxiety     Past Surgical History  Procedure Date  . Tonsillectomy   . Laparoscopy     No family history on file.  History  Substance Use Topics  . Smoking status: Current Every Day Smoker  . Smokeless tobacco: Not on file  . Alcohol Use: No    OB History    Grav Para Term Preterm Abortions TAB SAB Ect Mult Living                  Review of Systems  Constitutional: Positive for activity change. Negative for fever, chills, diaphoresis and fatigue.  HENT: Negative for ear pain, trouble swallowing, neck pain, sinus pressure and tinnitus.   Eyes: Negative for visual disturbance.  Respiratory: Positive for cough, shortness of breath and wheezing.   Gastrointestinal: Positive for nausea and vomiting. Negative for abdominal pain and diarrhea.  Skin: Negative for rash.    Neurological: Positive for dizziness and headaches. Negative for tremors, seizures, syncope, facial asymmetry, speech difficulty, weakness, light-headedness and numbness.    Allergies  Review of patient's allergies indicates no known allergies.  Home Medications   Current Outpatient Rx  Name Route Sig Dispense Refill  . BUSPIRONE HCL 15 MG PO TABS Oral Take 15 mg by mouth 2 (two) times daily.    Marland Kitchen GABAPENTIN 400 MG PO CAPS Oral Take 400 mg by mouth 3 (three) times daily.    . QUETIAPINE FUMARATE 100 MG PO TABS Oral Take 100 mg by mouth at bedtime.    Marland Kitchen AMLODIPINE BESYLATE 5 MG PO TABS Oral Take 5 mg by mouth daily.    Marland Kitchen FLUOXETINE HCL 20 MG PO CAPS Oral Take 20 mg by mouth daily.    Marland Kitchen PREDNISONE 20 MG PO TABS Oral Take 3 tablets (60 mg total) by mouth daily. 12 tablet 0  . TRAZODONE HCL 100 MG PO TABS Oral Take 250 mg by mouth at bedtime.    Marland Kitchen ZOLPIDEM TARTRATE 10 MG PO TABS Oral Take 10 mg by mouth at bedtime as needed.      BP 111/78  Pulse 100  Temp 97.5 F (36.4 C) (Oral)  Resp 22  SpO2 97%  Physical Exam  Nursing note and vitals reviewed. Constitutional: Vital signs are normal. She appears well-developed and well-nourished.  Non-toxic appearance. She has a sickly appearance. She appears ill. No distress.  HENT:  Head: Normocephalic.  Eyes: Conjunctivae normal are normal. Right eye exhibits no discharge. Left eye exhibits no discharge.  Neck: Neck supple. No JVD present.  Cardiovascular: Normal rate and regular rhythm.   Pulmonary/Chest: No respiratory distress. She has wheezes. She has no rales. She exhibits no tenderness.  Musculoskeletal: Normal range of motion.  Lymphadenopathy:    She has no cervical adenopathy.  Neurological: She is alert. She has normal strength. She is not disoriented. She displays no tremor. No cranial nerve deficit or sensory deficit. She exhibits normal muscle tone. GCS eye subscore is 4. GCS verbal subscore is 5. GCS motor subscore is 6.        Unable to assess ambulation as patient is confined to wheelchair not willing to contribute with this part of the exam  Skin: No rash noted. No erythema.    ED Course  Procedures (including critical care time)  Labs Reviewed  POCT I-STAT, CHEM 8 - Abnormal; Notable for the following:    Creatinine, Ser 1.20 (*)     Glucose, Bld 104 (*)     Calcium, Ion 1.25 (*)     All other components within normal limits  CBC   No results found.   1. Headache       MDM  Severe headache, with no focal neurological findings. Patient describes the most severe headache that she has experienced. Patient is nonambulatory secondarily to pain and dizziness. Patient will be transferred to the emergency department for further observation monitoring to be consider for imaging of no improvement. While at urgent care patient had preliminary labs included i-STAT and a CBC with no remarkable findings except for a slightly elevated creatinine.     Jimmie Molly, MD 03/16/12 1019

## 2012-04-18 ENCOUNTER — Encounter (HOSPITAL_COMMUNITY): Payer: Self-pay | Admitting: Neurology

## 2012-04-18 ENCOUNTER — Other Ambulatory Visit: Payer: Self-pay | Admitting: Obstetrics & Gynecology

## 2012-04-18 DIAGNOSIS — Z1231 Encounter for screening mammogram for malignant neoplasm of breast: Secondary | ICD-10-CM

## 2012-05-09 ENCOUNTER — Ambulatory Visit (HOSPITAL_COMMUNITY): Payer: Self-pay

## 2012-05-12 ENCOUNTER — Ambulatory Visit (HOSPITAL_COMMUNITY): Payer: Self-pay

## 2012-05-21 ENCOUNTER — Encounter (HOSPITAL_COMMUNITY): Payer: Self-pay | Admitting: *Deleted

## 2012-05-21 ENCOUNTER — Emergency Department (HOSPITAL_COMMUNITY)
Admission: EM | Admit: 2012-05-21 | Discharge: 2012-05-21 | Disposition: A | Discharge status: Home / Self Care | Payer: Self-pay | Attending: Emergency Medicine | Admitting: Emergency Medicine

## 2012-05-21 DIAGNOSIS — J449 Chronic obstructive pulmonary disease, unspecified: Secondary | ICD-10-CM | POA: Insufficient documentation

## 2012-05-21 DIAGNOSIS — K047 Periapical abscess without sinus: Secondary | ICD-10-CM | POA: Insufficient documentation

## 2012-05-21 DIAGNOSIS — Z8739 Personal history of other diseases of the musculoskeletal system and connective tissue: Secondary | ICD-10-CM | POA: Insufficient documentation

## 2012-05-21 DIAGNOSIS — K219 Gastro-esophageal reflux disease without esophagitis: Secondary | ICD-10-CM | POA: Insufficient documentation

## 2012-05-21 DIAGNOSIS — F17200 Nicotine dependence, unspecified, uncomplicated: Secondary | ICD-10-CM | POA: Insufficient documentation

## 2012-05-21 DIAGNOSIS — J4489 Other specified chronic obstructive pulmonary disease: Secondary | ICD-10-CM | POA: Insufficient documentation

## 2012-05-21 DIAGNOSIS — F411 Generalized anxiety disorder: Secondary | ICD-10-CM | POA: Insufficient documentation

## 2012-05-21 DIAGNOSIS — F3289 Other specified depressive episodes: Secondary | ICD-10-CM | POA: Insufficient documentation

## 2012-05-21 DIAGNOSIS — I10 Essential (primary) hypertension: Secondary | ICD-10-CM | POA: Insufficient documentation

## 2012-05-21 DIAGNOSIS — Z79899 Other long term (current) drug therapy: Secondary | ICD-10-CM | POA: Insufficient documentation

## 2012-05-21 DIAGNOSIS — J45909 Unspecified asthma, uncomplicated: Secondary | ICD-10-CM | POA: Insufficient documentation

## 2012-05-21 DIAGNOSIS — F329 Major depressive disorder, single episode, unspecified: Secondary | ICD-10-CM | POA: Insufficient documentation

## 2012-05-21 MED ORDER — OXYCODONE HCL 5 MG PO TABS: 5.0000 mg | ORAL_TABLET | Freq: Once | ORAL | Status: DC

## 2012-05-21 MED ORDER — PENICILLIN V POTASSIUM 250 MG PO TABS
500.0000 mg | ORAL_TABLET | Freq: Once | ORAL | Status: AC
  Administered 2012-05-21: 500 mg via ORAL
  Filled 2012-05-21: qty 2

## 2012-05-21 MED ORDER — HYDROCODONE-ACETAMINOPHEN 5-325 MG PO TABS: 1.0000 | ORAL_TABLET | Freq: Once | ORAL | Status: DC

## 2012-05-21 MED ORDER — TRAMADOL HCL 50 MG PO TABS: 50.0000 mg | ORAL_TABLET | Freq: Four times a day (QID) | ORAL | Status: DC | PRN

## 2012-05-21 MED ORDER — PENICILLIN V POTASSIUM 500 MG PO TABS: 500.0000 mg | ORAL_TABLET | Freq: Three times a day (TID) | ORAL | Status: DC

## 2012-05-21 MED ORDER — TRAMADOL HCL 50 MG PO TABS
50.0000 mg | ORAL_TABLET | Freq: Once | ORAL | Status: AC
  Administered 2012-05-21: 50 mg via ORAL
  Filled 2012-05-21: qty 1

## 2012-05-21 NOTE — Discharge Instructions (Signed)
Abscessed Tooth A tooth abscess is a collection of infected fluid (pus) from a bacterial infection in the inner part of the tooth (pulp). It usually occurs at the end of the tooth's root.  CAUSES   A very bad cavity (extensive tooth decay).   Trauma to the tooth, such as a broken or chipped tooth, that allows bacteria to enter into the pulp.  SYMPTOMS  Severe pain in and around the infected tooth.   Swelling and redness around the abscessed tooth or in the mouth or face.   Tenderness.   Pus drainage.   Bad breath.   Bitter taste in the mouth.   Difficulty swallowing.   Difficulty opening the mouth.   Feeling sick to your stomach (nauseous).   Vomiting.   Chills.   Swollen neck glands.  DIAGNOSIS  A medical and dental history will be taken.   An examination will be performed by tapping on the abscessed tooth.   X-rays may be taken of the tooth to identify the abscess.  TREATMENT The goal of treatment is to eliminate the infection.   You may be prescribed antibiotic medicine to stop the infection from spreading.   A root canal may be performed to save the tooth. If the tooth cannot be saved, it may be pulled (extracted) and the abscess may be drained.  HOME CARE INSTRUCTIONS  Only take over-the-counter or prescription medicines for pain, fever, or discomfort as directed by your caregiver.   Do not drive after taking pain medicine (narcotics).   Rinse your mouth (gargle) often with salt water ( tsp salt in 8 oz of warm water) to relieve pain or swelling.   Do not apply heat to the outside of your face.   Return to your dentist for further treatment as directed.  SEEK IMMEDIATE DENTAL CARE IF:  You have a temperature by mouth above 102 F (38.9 C), not controlled by medicine.   You have chills or a very bad headache.   You have problems breathing or swallowing.   Your have trouble opening your mouth.   You develop swelling in the neck or around the eye.    Your pain is not helped by medicine.   Your pain is getting worse instead of better.  Document Released: 06/07/2005 Document Revised: 05/27/2011 Document Reviewed: 09/15/2010 [facility] Patient Information 2012 [place], [place].   Complete your entire course of antibiotics as prescribed.  You  may use the tramadol for pain relief but do not drive within 4 hours of taking as this will make you drowsy.  Avoid applying heat or ice to this abscess area which can worsen your symptoms.  You may use warm salt water swish and spit treatment or half peroxide and water swish and spit after meals to keep this area clean as discussed.  Call the dentist listed above for further management of your symptoms.

## 2012-05-21 NOTE — ED Notes (Signed)
Pt c/o pain to 2 teeth on the upper left side of her mouth. Pt also c/o some swelling. X 1 wk

## 2012-05-22 NOTE — ED Provider Notes (Signed)
Medical screening examination/treatment/procedure(s) were performed by non-physician practitioner and as supervising physician I was immediately available for consultation/collaboration.   Glynn Octave, MD 05/22/12 [phone]

## 2012-05-22 NOTE — ED Provider Notes (Signed)
History     CSN: [id number]  Arrival date & time 05/21/12  1958   First MD Initiated Contact with Patient 05/21/12 2142      Chief Complaint  Patient presents with  . Dental Pain    (Consider location/radiation/quality/duration/timing/severity/associated sxs/prior treatment) HPI Comments: Catherine Butler presents with a 7 day history of dental pain and gingival swelling.   The patient has a history of chronic decay of the teeth involved which has recently started to cause pain.  There has been no fevers,  Chills, nausea or vomiting, also no complaint of difficulty swallowing,  Although chewing makes pain worse.  The patient has tried tylenol without relief of symptoms.      The history is provided by the patient.    Past Medical History  Diagnosis Date  . Asthma   . Hypertension   . Arthritis   . Reflux   . COPD (chronic obstructive pulmonary disease)   . Depression   . Anxiety     Past Surgical History  Procedure Date  . Laporscopy surgery for ovarian cyst 20 years ago  . Tonsillectomy   . Laparoscopy     Family History  Problem Relation Age of Onset  . Allergic rhinitis Paternal Grandfather   . Diabetes Father   . Hypertension Father 50    heart attack  . Breast cancer Mother 30    lump removed, bile duct cancer  . Hypertension Brother     History  Substance Use Topics  . Smoking status: Current Every Day Smoker -- 1.0 packs/day for 30 years    Types: Cigarettes  . Smokeless tobacco: Not on file     Comment: states "might be interested in the smoking cessation program"  Pt given counseling related to smoking cessation  . Alcohol Use: No    OB History    Grav Para Term Preterm Abortions TAB SAB Ect Mult Living   3 1 1  1  1   1       Review of Systems  Constitutional: Negative for fever.  HENT: Positive for dental problem. Negative for sore throat, facial swelling, neck pain and neck stiffness.   Respiratory: Negative for shortness of breath.      Allergies  Acetaminophen  Home Medications   Current Outpatient Rx  Name  Route  Sig  Dispense  Refill  . BUSPIRONE HCL 15 MG PO TABS   Oral   Take 15 mg by mouth 3 (three) times daily.          Marland Kitchen FLUOXETINE HCL 20 MG PO CAPS   Oral   Take 20 mg by mouth daily.         Marland Kitchen GABAPENTIN 400 MG PO CAPS   Oral   Take 400 mg by mouth 3 (three) times daily.         . QUETIAPINE FUMARATE 100 MG PO TABS   Oral   Take 100 mg by mouth at bedtime.         Marland Kitchen PENICILLIN V POTASSIUM 500 MG PO TABS   Oral   Take 1 tablet (500 mg total) by mouth 3 (three) times daily.   30 tablet   0   . TRAMADOL HCL 50 MG PO TABS   Oral   Take 1 tablet (50 mg total) by mouth every 6 (six) hours as needed for pain.   15 tablet   0     BP 144/90  Pulse 96  Temp 97.9 F (36.6  C) (Oral)  Resp 20  SpO2 100%  LMP 02/22/2006  Physical Exam  Constitutional: She is oriented to person, place, and time. She appears well-developed and well-nourished. No distress.  HENT:  Head: Normocephalic and atraumatic.  Right Ear: Tympanic membrane and external ear normal.  Left Ear: Tympanic membrane and external ear normal.  Mouth/Throat: Oropharynx is clear and moist and mucous membranes are normal. No oral lesions. Dental abscesses present.    Eyes: Conjunctivae normal are normal.  Neck: Normal range of motion. Neck supple.  Cardiovascular: Normal rate and normal heart sounds.   Pulmonary/Chest: Effort normal.  Abdominal: She exhibits no distension.  Musculoskeletal: Normal range of motion.  Lymphadenopathy:    She has no cervical adenopathy.  Neurological: She is alert and oriented to person, place, and time.  Skin: Skin is warm and dry. No erythema.  Psychiatric: She has a normal mood and affect.    ED Course  Procedures (including critical care time)  Labs Reviewed - No data to display No results found.   1. Dental abscess       MDM  Pt prescribed pen v, tramadol.  Dental  referrals given.  The patient appears reasonably screened and/or stabilized for discharge and I doubt any other medical condition or other [facility] requiring further screening, evaluation, or treatment in the ED at this time prior to discharge.         Burgess Amor, [place] 05/22/12 1157

## 2012-05-31 ENCOUNTER — Other Ambulatory Visit: Payer: Self-pay | Admitting: Obstetrics & Gynecology

## 2012-05-31 DIAGNOSIS — Z1231 Encounter for screening mammogram for malignant neoplasm of breast: Secondary | ICD-10-CM

## 2012-06-21 DIAGNOSIS — T50912A Poisoning by multiple unspecified drugs, medicaments and biological substances, intentional self-harm, initial encounter: Secondary | ICD-10-CM

## 2012-06-21 HISTORY — DX: Poisoning by multiple unspecified drugs, medicaments and biological substances, intentional self-harm, initial encounter: T50.912A

## 2012-06-23 ENCOUNTER — Ambulatory Visit (HOSPITAL_COMMUNITY)
Admission: RE | Admit: 2012-06-23 | Discharge: 2012-06-23 | Disposition: A | Discharge status: Home / Self Care | Payer: Self-pay | Source: Ambulatory Visit | Attending: Obstetrics & Gynecology | Admitting: Obstetrics & Gynecology

## 2012-06-23 DIAGNOSIS — Z1231 Encounter for screening mammogram for malignant neoplasm of breast: Secondary | ICD-10-CM

## 2012-09-12 ENCOUNTER — Emergency Department (HOSPITAL_COMMUNITY)
Admission: EM | Admit: 2012-09-12 | Discharge: 2012-09-13 | Disposition: A | Discharge status: Home / Self Care | Payer: Self-pay | Attending: Emergency Medicine | Admitting: Emergency Medicine

## 2012-09-12 ENCOUNTER — Encounter (HOSPITAL_COMMUNITY): Payer: Self-pay | Admitting: Emergency Medicine

## 2012-09-12 DIAGNOSIS — F10929 Alcohol use, unspecified with intoxication, unspecified: Secondary | ICD-10-CM

## 2012-09-12 DIAGNOSIS — I10 Essential (primary) hypertension: Secondary | ICD-10-CM | POA: Insufficient documentation

## 2012-09-12 DIAGNOSIS — T438X2A Poisoning by other psychotropic drugs, intentional self-harm, initial encounter: Secondary | ICD-10-CM | POA: Insufficient documentation

## 2012-09-12 DIAGNOSIS — T424X4A Poisoning by benzodiazepines, undetermined, initial encounter: Secondary | ICD-10-CM | POA: Insufficient documentation

## 2012-09-12 DIAGNOSIS — F329 Major depressive disorder, single episode, unspecified: Secondary | ICD-10-CM | POA: Insufficient documentation

## 2012-09-12 DIAGNOSIS — R45851 Suicidal ideations: Secondary | ICD-10-CM | POA: Insufficient documentation

## 2012-09-12 DIAGNOSIS — J449 Chronic obstructive pulmonary disease, unspecified: Secondary | ICD-10-CM | POA: Insufficient documentation

## 2012-09-12 DIAGNOSIS — F3289 Other specified depressive episodes: Secondary | ICD-10-CM | POA: Insufficient documentation

## 2012-09-12 DIAGNOSIS — Z8739 Personal history of other diseases of the musculoskeletal system and connective tissue: Secondary | ICD-10-CM | POA: Insufficient documentation

## 2012-09-12 DIAGNOSIS — F411 Generalized anxiety disorder: Secondary | ICD-10-CM | POA: Insufficient documentation

## 2012-09-12 DIAGNOSIS — K219 Gastro-esophageal reflux disease without esophagitis: Secondary | ICD-10-CM | POA: Insufficient documentation

## 2012-09-12 DIAGNOSIS — T43502A Poisoning by unspecified antipsychotics and neuroleptics, intentional self-harm, initial encounter: Secondary | ICD-10-CM | POA: Insufficient documentation

## 2012-09-12 DIAGNOSIS — F1010 Alcohol abuse, uncomplicated: Secondary | ICD-10-CM | POA: Insufficient documentation

## 2012-09-12 DIAGNOSIS — J4489 Other specified chronic obstructive pulmonary disease: Secondary | ICD-10-CM | POA: Insufficient documentation

## 2012-09-12 DIAGNOSIS — F17200 Nicotine dependence, unspecified, uncomplicated: Secondary | ICD-10-CM | POA: Insufficient documentation

## 2012-09-12 DIAGNOSIS — Z79899 Other long term (current) drug therapy: Secondary | ICD-10-CM | POA: Insufficient documentation

## 2012-09-12 LAB — GLUCOSE, CAPILLARY: Glucose-Capillary: 122 mg/dL — ABNORMAL HIGH (ref 70–99)

## 2012-09-12 NOTE — ED Notes (Signed)
Pt arrived EMS with a complaint of overdose.  Pt is reported to have taken 27 1mg  clonazepam.  A fifth of vodka was found near the pt and the pt smells of etoh.  EMS had put in a NPA and had O2 on but it was removed by pt.  Pt is upset that she was not taken to [facility].

## 2012-09-12 NOTE — ED Provider Notes (Signed)
History     CSN: [id number]  Arrival date & time 09/12/12  2259   First MD Initiated Contact with Patient 09/12/12 2305      Chief Complaint  Patient presents with  . Drug Overdose    (Consider location/radiation/quality/duration/timing/severity/associated sxs/prior treatment) HPI Comments: Patient arrives via EMS after apparently ingesting 27 tablets of 1 mg clonazepam. She also has ingested some vodka. This was a suicide attempt. Per EMS she was somnolent on arrival and required nasal trumpet but then became agitated. Now she is awake and alert and protecting her airway. She denies any coingestants. She tells me she has a history of COPD, depression and anxiety. She is upset that she is not taking at [facility].   The history is provided by the patient and the EMS personnel. The history is limited by the condition of the patient.    Past Medical History  Diagnosis Date  . Asthma   . Hypertension   . Arthritis   . Reflux   . COPD (chronic obstructive pulmonary disease)   . Depression   . Anxiety     Past Surgical History  Procedure Laterality Date  . Laporscopy surgery for ovarian cyst  20 years ago  . Tonsillectomy    . Laparoscopy      Family History  Problem Relation Age of Onset  . Allergic rhinitis Paternal Grandfather   . Diabetes Father   . Hypertension Father 50    heart attack  . Breast cancer Mother 30    lump removed, bile duct cancer  . Hypertension Brother     History  Substance Use Topics  . Smoking status: Current Every Day Smoker -- 1.00 packs/day for 30 years    Types: Cigarettes  . Smokeless tobacco: Not on file     Comment: states "might be interested in the smoking cessation program"  Pt given counseling related to smoking cessation  . Alcohol Use: No    OB History   Grav Para Term Preterm Abortions TAB SAB Ect Mult Living   3 1 1  1  1   1       Review of Systems  Constitutional: Negative for fever, activity change and appetite  change.  Respiratory: Negative for cough, chest tightness and shortness of breath.   Cardiovascular: Negative for chest pain.  Gastrointestinal: Negative for nausea, vomiting and abdominal pain.  Genitourinary: Negative for dysuria and hematuria.  Musculoskeletal: Negative for back pain.  Psychiatric/Behavioral: Positive for suicidal ideas. The patient is nervous/anxious.   A complete 10 system review of systems was obtained and all systems are negative except as noted in the HPI and PMH.  \  Allergies  Acetaminophen  Home Medications   Current Outpatient Rx  Name  Route  Sig  Dispense  Refill  . albuterol (PROVENTIL HFA;VENTOLIN HFA) 108 (90 BASE) MCG/ACT inhaler   Inhalation   Inhale 2 puffs into the lungs every 6 (six) hours as needed for wheezing.         Marland Kitchen albuterol-ipratropium (COMBIVENT) 18-103 MCG/ACT inhaler   Inhalation   Inhale 2 puffs into the lungs every 6 (six) hours as needed for wheezing.         . busPIRone (BUSPAR) 15 MG tablet   Oral   Take 15 mg by mouth 3 (three) times daily.          . clonazePAM (KLONOPIN) 1 MG tablet   Oral   Take 1 mg by mouth daily.         Marland Kitchen  gabapentin (NEURONTIN) 400 MG capsule   Oral   Take 400 mg by mouth 3 (three) times daily.         . QUEtiapine (SEROQUEL) 400 MG tablet   Oral   Take 400 mg by mouth at bedtime.           BP 118/88  Pulse 96  Temp(Src) 98.1 F (36.7 C) (Oral)  Resp 18  SpO2 96%  LMP 02/22/2006  Physical Exam  Constitutional: She is oriented to person, place, and time. She appears well-developed and well-nourished. No distress.  Smells of alcohol, somnolent arousable  HENT:  Head: Normocephalic and atraumatic.  Mouth/Throat: Oropharynx is clear and moist. No oropharyngeal exudate.  Eyes: Conjunctivae and EOM are normal. Pupils are equal, round, and reactive to light.  Neck: Normal range of motion. Neck supple.  Cardiovascular: Normal rate, regular rhythm and normal heart sounds.    No murmur heard. Pulmonary/Chest: Effort normal and breath sounds normal. No respiratory distress.  Abdominal: Soft. There is no tenderness. There is no rebound and no guarding.  Musculoskeletal: Normal range of motion. She exhibits no edema and no tenderness.  Neurological: She is alert and oriented to person, place, and time. No cranial nerve deficit. She exhibits normal muscle tone. Coordination normal.  Skin: Skin is warm.    ED Course  Procedures (including critical care time)  Labs Reviewed  CBC WITH DIFFERENTIAL - Abnormal; Notable for the following:    RBC 3.73 (*)    Hemoglobin 10.5 (*)    HCT 31.7 (*)    All other components within normal limits  COMPREHENSIVE METABOLIC PANEL - Abnormal; Notable for the following:    Sodium 132 (*)    Potassium 3.3 (*)    Glucose, Bld 109 (*)    Albumin 3.3 (*)    Alkaline Phosphatase 121 (*)    Total Bilirubin 0.1 (*)    GFR calc non Af Amer 70 (*)    GFR calc Af Amer 81 (*)    All other components within normal limits  SALICYLATE LEVEL - Abnormal; Notable for the following:    Salicylate Lvl <2.0 (*)    All other components within normal limits  ETHANOL - Abnormal; Notable for the following:    Alcohol, Ethyl (B) 135 (*)    All other components within normal limits  GLUCOSE, CAPILLARY - Abnormal; Notable for the following:    Glucose-Capillary 122 (*)    All other components within normal limits  SALICYLATE LEVEL - Abnormal; Notable for the following:    Salicylate Lvl <2.0 (*)    All other components within normal limits  ACETAMINOPHEN LEVEL  URINE RAPID DRUG SCREEN (HOSP PERFORMED)  URINALYSIS, ROUTINE W REFLEX MICROSCOPIC  ACETAMINOPHEN LEVEL   No results found.   No diagnosis found.    MDM  Apparent Intentional overdose of alcohol and benzodiazepines. Vital stable, awake and alert, protecting airway.  Screening labs, sitter, d/w psychiatry.  Psychiatry consult complete. Patient demonstrating regret and denies  thoughts of hurting herself. She is contracting for safety and cleared for discharge.  On reassessment, patient is alert and oriented x3. She denies taking any pills in an overdose attempt tonight. She admits to drinking alcohol. Her drug screen is negative for benzodiazepines though seems unlikely that she did in fact take Klonopin. Patient denies any suicidal or homicidal ideation. She's tolerating by mouth and she is ambulatory without assistance.  She is stable for discharge.    Date: 09/12/2012  Rate: 91  Rhythm: normal sinus rhythm  QRS Axis: normal  Intervals: normal  ST/T Wave abnormalities: normal  Conduction Disutrbances:none  Narrative Interpretation:   Old EKG Reviewed: unchanged          Glynn Octave, MD 09/13/12 [id number]

## 2012-09-12 NOTE — ED Notes (Signed)
WJX:BJYN<WG> Expected date:09/12/12<BR> Expected time:10:38 PM<BR> Means of arrival:Ambulance<BR> Comments:<BR> Klonapin overdose

## 2012-09-13 LAB — SALICYLATE LEVEL
Salicylate Lvl: <2 mg/dL — ABNORMAL LOW (ref 2.8–20.0)
Salicylate Lvl: <2 mg/dL — ABNORMAL LOW (ref 2.8–20.0)

## 2012-09-13 LAB — URINALYSIS, ROUTINE W REFLEX MICROSCOPIC
Bilirubin Urine: NEGATIVE
Glucose, UA: NEGATIVE mg/dL
Hgb urine dipstick: NEGATIVE
Ketones, ur: NEGATIVE mg/dL
Leukocytes, UA: NEGATIVE
Nitrite: NEGATIVE
Protein, ur: NEGATIVE mg/dL
Specific Gravity, Urine: 1.011 (ref 1.005–1.030)
Urobilinogen, UA: 0.2 mg/dL (ref 0.0–1.0)
pH: 6.5 (ref 5.0–8.0)

## 2012-09-13 LAB — CBC WITH DIFFERENTIAL/PLATELET
Basophils Absolute: 0 K/uL (ref 0.0–0.1)
Basophils Relative: 1 % (ref 0–1)
Eosinophils Absolute: 0.1 K/uL (ref 0.0–0.7)
Eosinophils Relative: 2 % (ref 0–5)
HCT: 31.7 % — ABNORMAL LOW (ref 36.0–46.0)
Hemoglobin: 10.5 g/dL — ABNORMAL LOW (ref 12.0–15.0)
Lymphocytes Relative: 34 % (ref 12–46)
Lymphs Abs: 1.8 K/uL (ref 0.7–4.0)
MCH: 28.2 pg (ref 26.0–34.0)
MCHC: 33.1 g/dL (ref 30.0–36.0)
MCV: 85 fL (ref 78.0–100.0)
Monocytes Absolute: 0.4 K/uL (ref 0.1–1.0)
Monocytes Relative: 7 % (ref 3–12)
Neutro Abs: 3 K/uL (ref 1.7–7.7)
Neutrophils Relative %: 56 % (ref 43–77)
Platelets: 205 K/uL (ref 150–400)
RBC: 3.73 MIL/uL — ABNORMAL LOW (ref 3.87–5.11)
RDW: 13.9 % (ref 11.5–15.5)
WBC: 5.3 K/uL (ref 4.0–10.5)

## 2012-09-13 LAB — COMPREHENSIVE METABOLIC PANEL WITH GFR
ALT: 15 U/L (ref 0–35)
AST: 17 U/L (ref 0–37)
Albumin: 3.3 g/dL — ABNORMAL LOW (ref 3.5–5.2)
Alkaline Phosphatase: 121 U/L — ABNORMAL HIGH (ref 39–117)
BUN: 9 mg/dL (ref 6–23)
CO2: 19 meq/L (ref 19–32)
Calcium: 8.8 mg/dL (ref 8.4–10.5)
Chloride: 99 meq/L (ref 96–112)
Creatinine, Ser: 0.94 mg/dL (ref 0.50–1.10)
GFR calc Af Amer: 81 mL/min — ABNORMAL LOW
GFR calc non Af Amer: 70 mL/min — ABNORMAL LOW
Glucose, Bld: 109 mg/dL — ABNORMAL HIGH (ref 70–99)
Potassium: 3.3 meq/L — ABNORMAL LOW (ref 3.5–5.1)
Sodium: 132 meq/L — ABNORMAL LOW (ref 135–145)
Total Bilirubin: 0.1 mg/dL — ABNORMAL LOW (ref 0.3–1.2)
Total Protein: 7 g/dL (ref 6.0–8.3)

## 2012-09-13 LAB — ACETAMINOPHEN LEVEL
Acetaminophen (Tylenol), Serum: <15 ug/mL (ref 10–30)
Acetaminophen (Tylenol), Serum: <15 ug/mL (ref 10–30)

## 2012-09-13 LAB — RAPID URINE DRUG SCREEN, HOSP PERFORMED
Barbiturates: NOT DETECTED
Benzodiazepines: NOT DETECTED

## 2012-09-13 LAB — ETHANOL: Alcohol, Ethyl (B): 135 mg/dL — ABNORMAL HIGH (ref 0–11)

## 2012-09-13 MED ORDER — QUETIAPINE FUMARATE 300 MG PO TABS: 400.0000 mg | ORAL_TABLET | Freq: Every day | ORAL | Status: DC

## 2012-09-13 MED ORDER — BUSPIRONE HCL 10 MG PO TABS: 15.0000 mg | ORAL_TABLET | Freq: Three times a day (TID) | ORAL | Status: DC

## 2012-09-13 MED ORDER — NICOTINE 21 MG/24HR TD PT24: 21.0000 mg | MEDICATED_PATCH | Freq: Every day | TRANSDERMAL | Status: DC

## 2012-09-13 MED ORDER — IBUPROFEN 200 MG PO TABS: 600.0000 mg | ORAL_TABLET | Freq: Three times a day (TID) | ORAL | Status: DC | PRN

## 2012-09-13 MED ORDER — ALBUTEROL SULFATE HFA 108 (90 BASE) MCG/ACT IN AERS: 2.0000 | INHALATION_SPRAY | Freq: Four times a day (QID) | RESPIRATORY_TRACT | Status: DC | PRN

## 2012-09-13 MED ORDER — IPRATROPIUM-ALBUTEROL 20-100 MCG/ACT IN AERS
2.0000 | INHALATION_SPRAY | Freq: Four times a day (QID) | RESPIRATORY_TRACT | Status: DC | PRN
  Filled 2012-09-13: qty 4

## 2012-09-13 MED ORDER — ONDANSETRON HCL 4 MG PO TABS: 4.0000 mg | ORAL_TABLET | Freq: Three times a day (TID) | ORAL | Status: DC | PRN

## 2012-09-13 MED ORDER — GABAPENTIN 400 MG PO CAPS
400.0000 mg | ORAL_CAPSULE | Freq: Three times a day (TID) | ORAL | Status: DC
  Filled 2012-09-13 (×3): qty 1

## 2012-09-13 NOTE — ED Notes (Signed)
Pt ambulated to the bathroom with no assist from staff, she was able to ambulate from her room to the bathroom

## 2012-09-13 NOTE — ED Notes (Signed)
Pt was able to contact her mother but [place] and [place] car so it will take about 1 to 2 hrs to get to the hospital.

## 2012-09-13 NOTE — ED Notes (Signed)
Writer ambulated pt to the bathroom with stand by assist. Pt has a steady gait.  Pt was able to get up and down from the bed with no assist.

## 2013-02-08 ENCOUNTER — Telehealth (HOSPITAL_COMMUNITY): Payer: Self-pay | Admitting: Licensed Clinical Social Worker

## 2013-02-08 ENCOUNTER — Encounter (HOSPITAL_COMMUNITY): Payer: Self-pay | Admitting: Emergency Medicine

## 2013-02-08 ENCOUNTER — Emergency Department (HOSPITAL_COMMUNITY)
Admission: EM | Admit: 2013-02-08 | Discharge: 2013-02-09 | Disposition: A | Discharge status: Home / Self Care | Payer: Self-pay | Attending: Emergency Medicine | Admitting: Emergency Medicine

## 2013-02-08 ENCOUNTER — Encounter (HOSPITAL_COMMUNITY): Payer: Self-pay | Admitting: Licensed Clinical Social Worker

## 2013-02-08 ENCOUNTER — Inpatient Hospital Stay (HOSPITAL_COMMUNITY)
Admission: RE | Admit: 2013-02-08 | Discharge: 2013-02-08 | Disposition: A | Discharge status: Home / Self Care | Payer: Self-pay | Attending: Psychiatry | Admitting: Psychiatry

## 2013-02-08 DIAGNOSIS — Z8719 Personal history of other diseases of the digestive system: Secondary | ICD-10-CM | POA: Insufficient documentation

## 2013-02-08 DIAGNOSIS — I10 Essential (primary) hypertension: Secondary | ICD-10-CM | POA: Insufficient documentation

## 2013-02-08 DIAGNOSIS — F411 Generalized anxiety disorder: Secondary | ICD-10-CM | POA: Insufficient documentation

## 2013-02-08 DIAGNOSIS — K029 Dental caries, unspecified: Secondary | ICD-10-CM

## 2013-02-08 DIAGNOSIS — F17200 Nicotine dependence, unspecified, uncomplicated: Secondary | ICD-10-CM | POA: Insufficient documentation

## 2013-02-08 DIAGNOSIS — J4489 Other specified chronic obstructive pulmonary disease: Secondary | ICD-10-CM | POA: Insufficient documentation

## 2013-02-08 DIAGNOSIS — R45851 Suicidal ideations: Secondary | ICD-10-CM | POA: Insufficient documentation

## 2013-02-08 DIAGNOSIS — J449 Chronic obstructive pulmonary disease, unspecified: Secondary | ICD-10-CM | POA: Insufficient documentation

## 2013-02-08 DIAGNOSIS — Z79899 Other long term (current) drug therapy: Secondary | ICD-10-CM | POA: Insufficient documentation

## 2013-02-08 DIAGNOSIS — F332 Major depressive disorder, recurrent severe without psychotic features: Secondary | ICD-10-CM | POA: Insufficient documentation

## 2013-02-08 DIAGNOSIS — R45850 Homicidal ideations: Secondary | ICD-10-CM | POA: Insufficient documentation

## 2013-02-08 DIAGNOSIS — Z8739 Personal history of other diseases of the musculoskeletal system and connective tissue: Secondary | ICD-10-CM | POA: Insufficient documentation

## 2013-02-08 LAB — COMPREHENSIVE METABOLIC PANEL
ALT: 37 U/L — ABNORMAL HIGH (ref 0–35)
AST: 52 U/L — ABNORMAL HIGH (ref 0–37)
Alkaline Phosphatase: 123 U/L — ABNORMAL HIGH (ref 39–117)
CO2: 21 mEq/L (ref 19–32)
Calcium: 9.2 mg/dL (ref 8.4–10.5)
Chloride: 97 mEq/L (ref 96–112)
GFR calc non Af Amer: 64 mL/min — ABNORMAL LOW (ref >90)
Potassium: 3.8 mEq/L (ref 3.5–5.1)
Sodium: 133 mEq/L — ABNORMAL LOW (ref 135–145)
Total Bilirubin: 0.4 mg/dL (ref 0.3–1.2)

## 2013-02-08 LAB — CBC
Platelets: 289 10*3/uL (ref 150–400)
RBC: 4.3 MIL/uL (ref 3.87–5.11)
WBC: 7.2 10*3/uL (ref 4.0–10.5)

## 2013-02-08 LAB — RAPID URINE DRUG SCREEN, HOSP PERFORMED
Barbiturates: NOT DETECTED
Cocaine: NOT DETECTED
Tetrahydrocannabinol: NOT DETECTED

## 2013-02-08 MED ORDER — NICOTINE 21 MG/24HR TD PT24
21.0000 mg | MEDICATED_PATCH | Freq: Every day | TRANSDERMAL | Status: DC
  Administered 2013-02-08 – 2013-02-09 (×2): 21 mg via TRANSDERMAL
  Filled 2013-02-08: qty 1

## 2013-02-08 MED ORDER — ONDANSETRON HCL 4 MG PO TABS: 4.0000 mg | ORAL_TABLET | Freq: Three times a day (TID) | ORAL | Status: DC | PRN

## 2013-02-08 MED ORDER — CLONAZEPAM 1 MG PO TABS
1.0000 mg | ORAL_TABLET | Freq: Every day | ORAL | Status: DC | PRN
Start: 2013-02-08 — End: 2013-02-09

## 2013-02-08 MED ORDER — QUETIAPINE FUMARATE 300 MG PO TABS
400.0000 mg | ORAL_TABLET | Freq: Every day | ORAL | Status: DC
  Administered 2013-02-08: 400 mg via ORAL
  Filled 2013-02-08: qty 1

## 2013-02-08 MED ORDER — ALBUTEROL SULFATE HFA 108 (90 BASE) MCG/ACT IN AERS: 2.0000 | INHALATION_SPRAY | Freq: Four times a day (QID) | RESPIRATORY_TRACT | Status: DC | PRN

## 2013-02-08 MED ORDER — IBUPROFEN 200 MG PO TABS: 600.0000 mg | ORAL_TABLET | Freq: Three times a day (TID) | ORAL | Status: DC | PRN

## 2013-02-08 MED ORDER — LORAZEPAM 1 MG PO TABS
1.0000 mg | ORAL_TABLET | Freq: Three times a day (TID) | ORAL | Status: DC | PRN
  Filled 2013-02-08: qty 1

## 2013-02-08 MED ORDER — ALUM & MAG HYDROXIDE-SIMETH 200-200-20 MG/5ML PO SUSP
30.0000 mL | ORAL | Status: DC | PRN
  Administered 2013-02-09: 30 mL via ORAL
  Filled 2013-02-08: qty 30

## 2013-02-08 MED ORDER — GABAPENTIN 400 MG PO CAPS
400.0000 mg | ORAL_CAPSULE | Freq: Three times a day (TID) | ORAL | Status: DC
  Administered 2013-02-08 (×2): 400 mg via ORAL
  Filled 2013-02-08 (×2): qty 1

## 2013-02-08 MED ORDER — GABAPENTIN 400 MG PO CAPS
800.0000 mg | ORAL_CAPSULE | Freq: Three times a day (TID) | ORAL | Status: DC
  Administered 2013-02-09: 800 mg via ORAL
  Filled 2013-02-08 (×5): qty 2

## 2013-02-08 NOTE — Progress Notes (Signed)
[facility] provided patient with a [employer] Orange Card application, highlighting [facility].  °
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
## 2013-02-08 NOTE — BH Assessment (Signed)
Pt accepted to [facility] by Nanine Means, [place] pending a 500 hall bed.

## 2013-02-08 NOTE — Consult Note (Signed)
[facility] Psychiatry Consult   Reason for Consult:  Depressed with suicidal ideation Referring Physician:  ER MD  Catherine Butler is an 52 y.o. female.  Assessment: AXIS I:  Major Depression, Recurrent severe AXIS II:  Deferred AXIS III:   Past Medical History  Diagnosis Date  . Asthma   . Hypertension   . Arthritis   . Reflux   . COPD (chronic obstructive pulmonary disease)   . Depression   . Anxiety    AXIS IV:  other psychosocial or environmental problems, problems related to social environment and takes care of mother and father without relief AXIS V:  51-60 moderate symptoms  Plan:  Discussed crisis plan, support from social network, calling 911, coming to the Emergency Department, and calling Suicide Hotline. Meets criteria for admission but really does not want to be in the hospital.  Will hold overnight and reassess in the morning and she may be able to go home  Subjective:   Catherine Butler is a 52 y.o. female patient admitted with depression and suicidal ideation.  HPI:  Ms Gleed says she has been depressed for years.  She was taking an antidepressant and antianxiety medication but ran out a month or so ago and has not been able to get refills because of no money, she is followed by [employer].  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  Her son lives with them, has a job, but is no help around the house.  She feels isolated and has gotten more depressed.  Last night she felt she could not take it any more and expressed a wish to die.  Today she is not suicidal but is not sure how she would feel if she went home.  Her husband left ten years ago. HPI Elements:   Location:  ER. Quality:  having suicidal thoughts. Severity:  moderate. Timing:  worse since she ran out of meds. Duration:  above. Context:  sole caretaker of her disabled parents.  Past Psychiatric History: Past Medical History  Diagnosis Date  . Asthma   . Hypertension   .  Arthritis   . Reflux   . COPD (chronic obstructive pulmonary disease)   . Depression   . Anxiety     reports that she has been smoking Cigarettes.  She has a 30 pack-year smoking history. She does not have any smokeless tobacco history on file. She reports that  drinks alcohol. She reports that she does not use illicit drugs. Family History  Problem Relation Age of Onset  . Allergic rhinitis Paternal Grandfather   . Diabetes Father   . Hypertension Father 50    heart attack  . Breast cancer Mother 30    lump removed, bile duct cancer  . Hypertension Brother            Allergies:   Allergies  Allergen Reactions  . Acetaminophen Hives    Past Psychiatric History: Diagnosis:  Major depression, recurrent, severe, non psychotic  Hospitalizations:  Several, last several years ago  Outpatient Care:  [facility]  Substance Abuse Care:  none  Self-Mutilation:  none  Suicidal Attempts:  none  Violent Behaviors:  none   Objective: Blood pressure 133/90, pulse 100, temperature 97.9 F (36.6 C), temperature source Oral, resp. rate 20, last menstrual period 02/22/2006, SpO2 100.00%.There is no weight on file to calculate BMI. Results for orders placed during the hospital encounter of 02/08/13 (from the past 72  hour(s))  URINE RAPID DRUG SCREEN (HOSP PERFORMED)     Status: None   Collection Time    02/08/13 10:59 AM      Result Value Range   Opiates NONE DETECTED  NONE DETECTED   Cocaine NONE DETECTED  NONE DETECTED   Benzodiazepines NONE DETECTED  NONE DETECTED   Amphetamines NONE DETECTED  NONE DETECTED   Tetrahydrocannabinol NONE DETECTED  NONE DETECTED   Barbiturates NONE DETECTED  NONE DETECTED   Comment:            DRUG SCREEN FOR MEDICAL PURPOSES     ONLY.  IF CONFIRMATION IS NEEDED     FOR ANY PURPOSE, NOTIFY LAB     WITHIN 5 DAYS.                LOWEST DETECTABLE LIMITS     FOR URINE DRUG SCREEN     Drug Class       Cutoff (ng/mL)     Amphetamine      1000      Barbiturate      200     Benzodiazepine   200     Tricyclics       300     Opiates          300     Cocaine          300     THC              50  CBC     Status: None   Collection Time    02/08/13 11:15 AM      Result Value Range   WBC 7.2  4.0 - 10.5 K/uL   RBC 4.30  3.87 - 5.11 MIL/uL   Hemoglobin 12.5  12.0 - 15.0 g/dL   HCT 16.1  09.6 - 04.5 %   MCV 86.7  78.0 - 100.0 fL   MCH 29.1  26.0 - 34.0 pg   MCHC 33.5  30.0 - 36.0 g/dL   RDW 40.9  81.1 - 91.4 %   Platelets 289  150 - 400 K/uL  COMPREHENSIVE METABOLIC PANEL     Status: Abnormal   Collection Time    02/08/13 11:15 AM      Result Value Range   Sodium 133 (*) 135 - 145 mEq/L   Potassium 3.8  3.5 - 5.1 mEq/L   Chloride 97  96 - 112 mEq/L   CO2 21  19 - 32 mEq/L   Glucose, Bld 102 (*) 70 - 99 mg/dL   BUN 12  6 - 23 mg/dL   Creatinine, Ser 7.82  0.50 - 1.10 mg/dL   Calcium 9.2  8.4 - 95.6 mg/dL   Total Protein 7.4  6.0 - 8.3 g/dL   Albumin 3.8  3.5 - 5.2 g/dL   AST 52 (*) 0 - 37 U/L   ALT 37 (*) 0 - 35 U/L   Alkaline Phosphatase 123 (*) 39 - 117 U/L   Total Bilirubin 0.4  0.3 - 1.2 mg/dL   GFR calc non Af Amer 64 (*) >90 mL/min   GFR calc Af Amer 74 (*) >90 mL/min   Comment: (NOTE)     The eGFR has been calculated using the CKD EPI equation.     This calculation has not been validated in all clinical situations.     eGFR's persistently <90 mL/min signify possible Chronic Kidney     Disease.  ETHANOL     Status:  None   Collection Time    02/08/13 11:15 AM      Result Value Range   Alcohol, Ethyl (B) <11  0 - 11 mg/dL   Comment:            LOWEST DETECTABLE LIMIT FOR     SERUM ALCOHOL IS 11 mg/dL     FOR MEDICAL PURPOSES ONLY   Labs are reviewed and are pertinent for nothing significant.  Current Facility-Administered Medications  Medication Dose Route Frequency Provider Last Rate Last Dose  . alum & mag hydroxide-simeth (MAALOX/MYLANTA) 200-200-20 MG/5ML suspension 30 mL  30 mL Oral PRN Pascal Lux  Wingen, PA-C      . ibuprofen (ADVIL,MOTRIN) tablet 600 mg  600 mg Oral Q8H PRN Pascal Lux Wingen, PA-C      . LORazepam (ATIVAN) tablet 1 mg  1 mg Oral Q8H PRN Pascal Lux Wingen, PA-C      . nicotine (NICODERM CQ - dosed in mg/24 hours) patch 21 mg  21 mg Transdermal Daily Pascal Lux Wingen, PA-C   21 mg at 02/08/13 1250  . ondansetron (ZOFRAN) tablet 4 mg  4 mg Oral Q8H PRN Magnus Sinning, PA-C       Current Outpatient Prescriptions  Medication Sig Dispense Refill  . albuterol (PROVENTIL HFA;VENTOLIN HFA) 108 (90 BASE) MCG/ACT inhaler Inhale 2 puffs into the lungs every 6 (six) hours as needed for wheezing.      . clonazePAM (KLONOPIN) 1 MG tablet Take 1 mg by mouth daily as needed for anxiety.       . diphenhydrAMINE (BENADRYL) 25 MG tablet Take 25 mg by mouth every 6 (six) hours as needed for itching or allergies.      Marland Kitchen gabapentin (NEURONTIN) 400 MG capsule Take 400 mg by mouth 3 (three) times daily.      . Melatonin 3 MG CAPS Take 1 capsule by mouth at bedtime.      Marland Kitchen QUEtiapine (SEROQUEL) 400 MG tablet Take 400 mg by mouth at bedtime.      . Vortioxetine HBr (BRINTELLIX) 10 MG TABS Take 1 tablet by mouth daily.        Psychiatric Specialty Exam:     Blood pressure 133/90, pulse 100, temperature 97.9 F (36.6 C), temperature source Oral, resp. rate 20, last menstrual period 02/22/2006, SpO2 100.00%.There is no weight on file to calculate BMI.  General Appearance: Fairly Groomed  Patent attorney::  Good  Speech:  Clear and Coherent and Normal Rate  Volume:  Normal  Mood:  Depressed  Affect:  Appropriate and Depressed  Thought Process:  Negative  Orientation:  Full (Time, Place, and Person)  Thought Content:  Negative  Suicidal Thoughts:  Yes.  without intent/plan  Homicidal Thoughts:  No  Memory:  Immediate;   Good Recent;   Good Remote;   Good  Judgement:  Intact  Insight:  Fair  Psychomotor Activity:  Normal  Concentration:  Good  Recall:  Good  Akathisia:  Negative   Handed:  Right  AIMS (if indicated):     Assets:  Communication Skills Desire for Improvement Housing Physical Health Transportation  Sleep:      Treatment Plan Summary: Daily contact with patient to assess and evaluate symptoms and progress in treatment Medication management Will hold pt overnight and if she is able will likely discharge her home tomorrow  TAYLOR,GERALD D 02/08/2013 1:17 PM

## 2013-02-08 NOTE — ED Provider Notes (Signed)
Medical screening examination/treatment/procedure(s) were conducted as a shared visit with non-physician practitioner(s) and myself.  I personally evaluated the patient during the encounter  Patient seen by me originally seen by mid-level. Patient presented today with suicidal thoughts said that for weeks but now has a plan to cut her wrists. Patient has attempted suicide in the past. Patient also expressing some homicidal thoughts toward her brother but does not have a specific plan. Patient will need a psychiatric evaluation patient has been on antidepressants in the past and is now out of them patient has been followed by [facility] in the past.  Patient labs without a sitting abnormality she been medically cleared for psychiatric evaluation.  Results for orders placed during the hospital encounter of 02/08/13  CBC      Result Value Range   WBC 7.2  4.0 - 10.5 K/uL   RBC 4.30  3.87 - 5.11 MIL/uL   Hemoglobin 12.5  12.0 - 15.0 g/dL   HCT 40.9  81.1 - 91.4 %   MCV 86.7  78.0 - 100.0 fL   MCH 29.1  26.0 - 34.0 pg   MCHC 33.5  30.0 - 36.0 g/dL   RDW 78.2  95.6 - 21.3 %   Platelets 289  150 - 400 K/uL  COMPREHENSIVE METABOLIC PANEL      Result Value Range   Sodium 133 (*) 135 - 145 mEq/L   Potassium 3.8  3.5 - 5.1 mEq/L   Chloride 97  96 - 112 mEq/L   CO2 21  19 - 32 mEq/L   Glucose, Bld 102 (*) 70 - 99 mg/dL   BUN 12  6 - 23 mg/dL   Creatinine, Ser 0.86  0.50 - 1.10 mg/dL   Calcium 9.2  8.4 - 57.8 mg/dL   Total Protein 7.4  6.0 - 8.3 g/dL   Albumin 3.8  3.5 - 5.2 g/dL   AST 52 (*) 0 - 37 U/L   ALT 37 (*) 0 - 35 U/L   Alkaline Phosphatase 123 (*) 39 - 117 U/L   Total Bilirubin 0.4  0.3 - 1.2 mg/dL   GFR calc non Af Amer 64 (*) >90 mL/min   GFR calc Af Amer 74 (*) >90 mL/min  ETHANOL      Result Value Range   Alcohol, Ethyl (B) <11  0 - 11 mg/dL  URINE RAPID DRUG SCREEN (HOSP PERFORMED)      Result Value Range   Opiates NONE DETECTED  NONE DETECTED   Cocaine NONE DETECTED  NONE  DETECTED   Benzodiazepines NONE DETECTED  NONE DETECTED   Amphetamines NONE DETECTED  NONE DETECTED   Tetrahydrocannabinol NONE DETECTED  NONE DETECTED   Barbiturates NONE DETECTED  NONE DETECTED   Mild abnormalities in liver function test noted total bili is normal. Alkaline phosphatase elevated slightly at 123 mild elevation in AST ALT not consistent with hepatitis. Renal function is normal.  Shelda Jakes, MD 02/08/13 1356

## 2013-02-08 NOTE — ED Notes (Signed)
Pt presenting to ed with c/o wanting medical clearance pt denies SI/HI at this time pt states she has had thoughts this week

## 2013-02-08 NOTE — Progress Notes (Signed)
02/08/2013 A. Lakeasha Petion RNCM 1947pm EDCM spoke to patient at bedside.  As per patient, she currently has a month supply of all of her medications.  [facility] informed patient about the needymeds.org medication assistance applications and the [facility] program.  Instructed patient that she needs to find a pcp to help fill out the application.  Patient stated, "I don't have any insurance, that's why I don't have a doctor."  [facility] provided patient with a list of pcps who accept self pay patients and placed this information on the chart.  Patient is aware.  EDCM also provided patient a RX discount card and placed it on patient's chart.  Patient thankful for resources.  No further needs at this time.
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
## 2013-02-08 NOTE — Progress Notes (Signed)
As per physiciatry note, patient has difficulty purchasing medications due to financial issues.  Patient is a candidate for the [facility] program.  MATCH program not initiated yet due to unsure of discharge date.  [facility] printed applications for medication assistance from website needymeds.org.  Also provided list of pcps who accept self pay patients, information regarding Medicaid and Affordable Care Act for insurance, list of discount pharmacies and website needymeds.org for medication assistance, and a list of financial resources in the community.  EDCM attempted to speak to patient, but patient was sleeping soundly.  EDCM placed all of the above information in patient's chart.  EDCM spoke to ACT team members and informed them that [facility] can assist with medications, and if she is discharged tomorrow 08/22 to make [facility] aware so that we can initiate MATCH program.

## 2013-02-08 NOTE — BH Assessment (Signed)
BHH Assessment Progress Note       Accepted to [facility] bed, but was seen by Dr Ladona Ridgel today and thought she might be dc in AM without going to [facility], would prefer to wait to see him. Understands she'll lose her bed. Dr Lubertha Basque note was in agreement with this possible disposition.
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
## 2013-02-09 MED ORDER — PENICILLIN V POTASSIUM 250 MG PO TABS: 250.0000 mg | ORAL_TABLET | Freq: Four times a day (QID) | ORAL | Status: DC

## 2013-02-09 NOTE — ED Provider Notes (Signed)
T/C from ACT team: Dr. Ladona Ridgel has evaluated pt today and requests EDP to d/c pt, as she is no longer having SI.  Case Manager called and requested EDP to evaluate pt re: dental caries. Will rx PCN, f/u dentist. Will d/c.  Physical examination: Vital signs and O2 SAT: Reviewed; Constitutional: Well developed, Well nourished, Well hydrated, In no acute distress; Head and Face: Normocephalic, Atraumatic; Eyes: EOMI, PERRL, No scleral icterus; ENMT: Mouth and pharynx normal, Poor dentition, Widespread dental decay, Mucous membranes moist, +upper left 1st and 2nd molars with extensive dental decay.  No gingival erythema, edema, fluctuance, or drainage.  No intra-oral edema. No submandibular or sublingual edema. No hoarse voice, no drooling, no stridor.  ; Neck: Supple, Full range of motion, No lymphadenopathy; Cardiovascular: Regular rate and rhythm; Respiratory: Breath sounds clear bilat. Normal respiratory effort/excursion; Chest: No deformity. Movement normal; Extremities: Pulses normal, No deformity.; Neuro: AA&Ox3, Major CN grossly intact. Gait steady. No gross focal motor or sensory deficits in extremities.; Skin: Color normal, No rash, No petechiae, Warm, Dry   Laray Anger, DO 02/09/13 1048

## 2013-02-09 NOTE — BHH Counselor (Signed)
Writer consulted with the Dr. Ladona Ridgel regarding the patient not meeting criteria for inpatient hospitalization. Writer discussed the recommendations of Dr. Ladona Ridgel with the ER MD.   Ranae Plumber met with the patient and she continues to deny SI/HI and psychosis. Writer provided the patient with outpatient referrals.

## 2013-02-09 NOTE — Progress Notes (Signed)
Catherine Butler says she is not suicidal she is more optimistic  About her situation.  Does not want to be inpatient.  Will discharge her home today.

## 2013-02-09 NOTE — Progress Notes (Deleted)
                  Dear ________Kelly D Loman______________:  Bonita Quin have been approved to have your discharge prescriptions filled through our [facility] (Medication Assistance Through [facility]) program. This program allows for a one-time (no refills) 34-day supply of selected medications for a low copay amount.  The copay is $3.00 per prescription. For instance, if you have one prescription, you will pay $3.00; for two prescriptions, you pay $6.00; for three prescriptions, you pay $9.00; and so on.  Only certain pharmacies are participating in this program with [facility]. You will need to select one of the pharmacies from the attached list and take your prescriptions, this letter, and your photo ID to one of the participating pharmacies.   We are excited that you are able to use the [facility] program to get your medications. These prescriptions must be filled within 7 days of hospital discharge or they will no longer be valid for the [facility] program. Should you have any problems with your prescriptions please contact your case management team member at [phone].  Thank you,   [employer] [facility]   Participating [facility] Pharmacies  [facility]   [facility] [address] [place], [place]   [facility] [address] [place], [place]   [facility] [address] [place], [place]   [facility]   [address], [place], [place]   [address] [place], [place], [place]   [address] [place], [place]   [address] [place], [place]   [address] [place], [place]   [address], [place], [place]   [address], [place], [place]   [address] [place], [place]   [address], [place], [place]   [address] [place], [place], [place]  [facility]   [address], [place], [place]   [address] [place], [place]   [address] [place], [place], [place]   [address], [place], [place]   [address] [place], [place]   [address] [place] [address] [place], [place], [place] [facility]   [address], [place], [place]   [address] [employer] [address], [place], [place]   [address], [place], [place]   [address] [employer], [place], [place]   [address], [place], [place]   [address] [place], [place]   [address], [place], [place]   [address], [place], [place]   [address], [place], [place]   [address], [place], [place]   [address], [place], [place]   [address] [place], [place]   [address], [place], [place]   [address] [place], [place], [place]  Independent Pharmacies   [facility] [address] [place], [address] [place], [place]   [facility] [address] [place], [place]   [facility] [address] [place], [place]   For continued medication needs, please contact the [facility] Department at  [phone].

## 2013-02-09 NOTE — Progress Notes (Addendum)
[facility] ED Cm re reviewed $3 co pay for each Rx through [facility] program for prescriptions received prior to the expiration date only-does not include refills, 7 day expiration of MATCH letter and choice of pharmacies Pt had informed evening CM on 02/08/13 that she had medicines at home  She inquired if she can get someone to assess her tooth abscess and provide her with an antibiotic CM spoke with [facility] MD and EDP, Clarene Duke

## 2013-02-09 NOTE — Progress Notes (Signed)
CM spoke with pt again after presecribed [place] for tooth abscess Spoke with her about getting medicine from $4 medication program discounted pharmacies.  Pt decided not to have MATCH assistance for this medication and reports having assistance from [facility] for her other medications already.  Pt not entered into [facility] program and able to repeat to CM that the [facility] letter would be used with a [facility] rx for $3 co pay at a participating [facility] program pharmacy but not for refills. CM provided pt with a free dental services resources at [employer] on September 26 & 27 2014 CM signing off

## 2013-05-07 ENCOUNTER — Emergency Department (HOSPITAL_COMMUNITY)
Admission: EM | Admit: 2013-05-07 | Discharge: 2013-05-07 | Disposition: A | Discharge status: Home / Self Care | Payer: Self-pay | Attending: Emergency Medicine | Admitting: Emergency Medicine

## 2013-05-07 ENCOUNTER — Encounter (HOSPITAL_COMMUNITY): Payer: Self-pay | Admitting: Emergency Medicine

## 2013-05-07 DIAGNOSIS — Z8739 Personal history of other diseases of the musculoskeletal system and connective tissue: Secondary | ICD-10-CM | POA: Insufficient documentation

## 2013-05-07 DIAGNOSIS — F3289 Other specified depressive episodes: Secondary | ICD-10-CM | POA: Insufficient documentation

## 2013-05-07 DIAGNOSIS — K0889 Other specified disorders of teeth and supporting structures: Secondary | ICD-10-CM

## 2013-05-07 DIAGNOSIS — Z79899 Other long term (current) drug therapy: Secondary | ICD-10-CM | POA: Insufficient documentation

## 2013-05-07 DIAGNOSIS — F17200 Nicotine dependence, unspecified, uncomplicated: Secondary | ICD-10-CM | POA: Insufficient documentation

## 2013-05-07 DIAGNOSIS — Z8719 Personal history of other diseases of the digestive system: Secondary | ICD-10-CM | POA: Insufficient documentation

## 2013-05-07 DIAGNOSIS — K089 Disorder of teeth and supporting structures, unspecified: Secondary | ICD-10-CM | POA: Insufficient documentation

## 2013-05-07 DIAGNOSIS — J4489 Other specified chronic obstructive pulmonary disease: Secondary | ICD-10-CM | POA: Insufficient documentation

## 2013-05-07 DIAGNOSIS — F329 Major depressive disorder, single episode, unspecified: Secondary | ICD-10-CM | POA: Insufficient documentation

## 2013-05-07 DIAGNOSIS — F411 Generalized anxiety disorder: Secondary | ICD-10-CM | POA: Insufficient documentation

## 2013-05-07 DIAGNOSIS — K029 Dental caries, unspecified: Secondary | ICD-10-CM | POA: Insufficient documentation

## 2013-05-07 DIAGNOSIS — J449 Chronic obstructive pulmonary disease, unspecified: Secondary | ICD-10-CM | POA: Insufficient documentation

## 2013-05-07 MED ORDER — AMOXICILLIN 500 MG PO CAPS: 500.0000 mg | ORAL_CAPSULE | Freq: Three times a day (TID) | ORAL | Status: DC

## 2013-05-07 MED ORDER — TRAMADOL HCL 50 MG PO TABS: 50.0000 mg | ORAL_TABLET | Freq: Four times a day (QID) | ORAL | Status: DC | PRN

## 2013-05-07 NOTE — ED Provider Notes (Signed)
Medical screening examination/treatment/procedure(s) were performed by non-physician practitioner and as supervising physician I was immediately available for consultation/collaboration.  EKG Interpretation   None         Jayden Kratochvil W Vester Titsworth, MD 05/07/13 1616 

## 2013-05-07 NOTE — ED Provider Notes (Signed)
CSN: [id number]     Arrival date & time 05/07/13  1036 History   First MD Initiated Contact with Patient 05/07/13 1043     Chief Complaint  Patient presents with  . Dental Pain   (Consider location/radiation/quality/duration/timing/severity/associated sxs/prior Treatment) Patient is a 52 y.o. female presenting with tooth pain. The history is provided by the patient.  Dental Pain Location:  Upper Upper teeth location:  14/LU 1st molar and 9/LU central incisor Quality:  Aching and throbbing Severity:  Mild Onset quality:  Gradual Duration:  2 weeks Timing:  Constant Progression:  Worsening Chronicity:  Chronic Context: dental caries and poor dentition   Context: not recent dental surgery and not trauma   Relieved by:  Nothing Worsened by:  Hot food/drink and cold food/drink Ineffective treatments:  Acetaminophen Associated symptoms: facial pain and gum swelling   Associated symptoms: no congestion, no difficulty swallowing, no facial swelling, no fever, no headaches, no neck pain, no oral bleeding, no oral lesions and no trismus   Risk factors: lack of dental care, periodontal disease and smoking   Risk factors: no diabetes     Past Medical History  Diagnosis Date  . Asthma   . Hypertension   . Arthritis   . Reflux   . COPD (chronic obstructive pulmonary disease)   . Depression   . Anxiety    Past Surgical History  Procedure Laterality Date  . Laporscopy surgery for ovarian cyst  20 years ago  . Tonsillectomy    . Laparoscopy     Family History  Problem Relation Age of Onset  . Allergic rhinitis Paternal Grandfather   . Diabetes Father   . Hypertension Father 50    heart attack  . Breast cancer Mother 30    lump removed, bile duct cancer  . Hypertension Brother    History  Substance Use Topics  . Smoking status: Current Every Day Smoker -- 1.00 packs/day for 30 years    Types: Cigarettes  . Smokeless tobacco: Not on file     Comment: states "might be  interested in the smoking cessation program"  Pt given counseling related to smoking cessation  . Alcohol Use: Yes     Comment: couple x a week   OB History   Grav Para Term Preterm Abortions TAB SAB Ect Mult Living   3 1 1  1  1   1      Review of Systems  Constitutional: Negative for fever and appetite change.  HENT: Positive for dental problem. Negative for congestion, facial swelling, mouth sores, sore throat and trouble swallowing.   Eyes: Negative for pain and visual disturbance.  Respiratory: Negative for shortness of breath.   Musculoskeletal: Negative for neck pain and neck stiffness.  Neurological: Negative for dizziness, facial asymmetry and headaches.  Hematological: Negative for adenopathy.  All other systems reviewed and are negative.    Allergies  Acetaminophen  Home Medications   Current Outpatient Rx  Name  Route  Sig  Dispense  Refill  . albuterol (PROVENTIL HFA;VENTOLIN HFA) 108 (90 BASE) MCG/ACT inhaler   Inhalation   Inhale 2 puffs into the lungs every 6 (six) hours as needed for wheezing.         . clonazePAM (KLONOPIN) 1 MG tablet   Oral   Take 1 mg by mouth daily as needed for anxiety.          . diphenhydrAMINE (BENADRYL) 25 MG tablet   Oral   Take 25 mg by mouth  every 6 (six) hours as needed for itching or allergies.         Marland Kitchen gabapentin (NEURONTIN) 400 MG capsule   Oral   Take 400 mg by mouth 3 (three) times daily.         . Melatonin 3 MG CAPS   Oral   Take 1 capsule by mouth at bedtime.         . penicillin v potassium (VEETID) 250 MG tablet   Oral   Take 1 tablet (250 mg total) by mouth 4 (four) times daily.   20 tablet   0   . QUEtiapine (SEROQUEL) 400 MG tablet   Oral   Take 400 mg by mouth at bedtime.         . Vortioxetine HBr (BRINTELLIX) 10 MG TABS   Oral   Take 1 tablet by mouth daily.          BP 133/82  Pulse 86  Temp(Src) 97.8 F (36.6 C) (Oral)  Resp 18  Ht 5\' 6"  (1.676 m)  Wt 200 lb (90.719 kg)   BMI 32.30 kg/m2  SpO2 98%  LMP 02/22/2006 Physical Exam  Nursing note and vitals reviewed. Constitutional: She is oriented to person, place, and time. She appears well-developed and well-nourished. No distress.  HENT:  Head: Normocephalic and atraumatic.  Right Ear: Tympanic membrane and ear canal normal.  Left Ear: Tympanic membrane and ear canal normal.  Mouth/Throat: Uvula is midline, oropharynx is clear and moist and mucous membranes are normal. No trismus in the jaw. Dental caries present. No dental abscesses or uvula swelling.  Dental caries of the left upper cental incisor and left first upper molar.  Widespread dental dz.  No facial swelling, obvious dental abscess, trismus, or sublingual abnml.    Neck: Normal range of motion. Neck supple.  Cardiovascular: Normal rate, regular rhythm and normal heart sounds.   No murmur heard. Pulmonary/Chest: Effort normal and breath sounds normal. No respiratory distress.  Musculoskeletal: Normal range of motion.  Lymphadenopathy:    She has no cervical adenopathy.  Neurological: She is alert and oriented to person, place, and time. She exhibits normal muscle tone. Coordination normal.  Skin: Skin is warm and dry.    ED Course  Procedures (including critical care time) Labs Review Labs Reviewed - No data to display Imaging Review No results found.  EKG Interpretation   None       MDM   Multiple dental caries and discoloration of most of the teeth.  Localized ttp of the upper left central incisor and left upper first molar.  No definite dental abscess, no facial swelling, or trismus.    Pt appears stable for discharge, VSS.  Plan includes Amoxil, ultram and dental referral   Caydin Yeatts L. Kenneith Stief, PA-C 05/07/13 1105

## 2013-05-07 NOTE — ED Notes (Signed)
Pt states dental pain x 1.5 weeks. Pain to two different teeth.

## 2013-06-11 ENCOUNTER — Emergency Department (HOSPITAL_COMMUNITY)
Admission: EM | Admit: 2013-06-11 | Discharge: 2013-06-11 | Disposition: A | Discharge status: Home / Self Care | Payer: Self-pay | Attending: Emergency Medicine | Admitting: Emergency Medicine

## 2013-06-11 ENCOUNTER — Emergency Department (HOSPITAL_COMMUNITY): Payer: Self-pay

## 2013-06-11 ENCOUNTER — Encounter (HOSPITAL_COMMUNITY): Payer: Self-pay | Admitting: Emergency Medicine

## 2013-06-11 DIAGNOSIS — W010XXA Fall on same level from slipping, tripping and stumbling without subsequent striking against object, initial encounter: Secondary | ICD-10-CM | POA: Insufficient documentation

## 2013-06-11 DIAGNOSIS — Y939 Activity, unspecified: Secondary | ICD-10-CM | POA: Insufficient documentation

## 2013-06-11 DIAGNOSIS — F3289 Other specified depressive episodes: Secondary | ICD-10-CM | POA: Insufficient documentation

## 2013-06-11 DIAGNOSIS — Z792 Long term (current) use of antibiotics: Secondary | ICD-10-CM | POA: Insufficient documentation

## 2013-06-11 DIAGNOSIS — Z8719 Personal history of other diseases of the digestive system: Secondary | ICD-10-CM | POA: Insufficient documentation

## 2013-06-11 DIAGNOSIS — F411 Generalized anxiety disorder: Secondary | ICD-10-CM | POA: Insufficient documentation

## 2013-06-11 DIAGNOSIS — S2249XA Multiple fractures of ribs, unspecified side, initial encounter for closed fracture: Secondary | ICD-10-CM | POA: Insufficient documentation

## 2013-06-11 DIAGNOSIS — I10 Essential (primary) hypertension: Secondary | ICD-10-CM | POA: Insufficient documentation

## 2013-06-11 DIAGNOSIS — Z79899 Other long term (current) drug therapy: Secondary | ICD-10-CM | POA: Insufficient documentation

## 2013-06-11 DIAGNOSIS — IMO0002 Reserved for concepts with insufficient information to code with codable children: Secondary | ICD-10-CM | POA: Insufficient documentation

## 2013-06-11 DIAGNOSIS — M129 Arthropathy, unspecified: Secondary | ICD-10-CM | POA: Insufficient documentation

## 2013-06-11 DIAGNOSIS — S2232XA Fracture of one rib, left side, initial encounter for closed fracture: Secondary | ICD-10-CM

## 2013-06-11 DIAGNOSIS — Y9289 Other specified places as the place of occurrence of the external cause: Secondary | ICD-10-CM | POA: Insufficient documentation

## 2013-06-11 DIAGNOSIS — F329 Major depressive disorder, single episode, unspecified: Secondary | ICD-10-CM | POA: Insufficient documentation

## 2013-06-11 DIAGNOSIS — F17200 Nicotine dependence, unspecified, uncomplicated: Secondary | ICD-10-CM | POA: Insufficient documentation

## 2013-06-11 DIAGNOSIS — J441 Chronic obstructive pulmonary disease with (acute) exacerbation: Secondary | ICD-10-CM | POA: Insufficient documentation

## 2013-06-11 MED ORDER — IBUPROFEN 800 MG PO TABS: 800.0000 mg | ORAL_TABLET | Freq: Three times a day (TID) | ORAL | Status: DC | PRN

## 2013-06-11 MED ORDER — OXYCODONE HCL 5 MG PO TABS
5.0000 mg | ORAL_TABLET | Freq: Once | ORAL | Status: AC
  Administered 2013-06-11: 5 mg via ORAL
  Filled 2013-06-11: qty 1

## 2013-06-11 MED ORDER — TRAMADOL HCL 50 MG PO TABS: 100.0000 mg | ORAL_TABLET | Freq: Once | ORAL | Status: DC

## 2013-06-11 MED ORDER — OXYCODONE HCL 5 MG PO TABS: 5.0000 mg | ORAL_TABLET | Freq: Four times a day (QID) | ORAL | Status: DC | PRN

## 2013-06-11 MED ORDER — ONDANSETRON HCL 4 MG PO TABS: 4.0000 mg | ORAL_TABLET | Freq: Once | ORAL | Status: DC

## 2013-06-11 MED ORDER — DIAZEPAM 5 MG PO TABS
5.0000 mg | ORAL_TABLET | Freq: Once | ORAL | Status: AC
  Administered 2013-06-11: 5 mg via ORAL
  Filled 2013-06-11: qty 1

## 2013-06-11 MED ORDER — METHOCARBAMOL 500 MG PO TABS: 1000.0000 mg | ORAL_TABLET | Freq: Once | ORAL | Status: DC

## 2013-06-11 MED ORDER — METHOCARBAMOL 500 MG PO TABS: 1000.0000 mg | ORAL_TABLET | Freq: Three times a day (TID) | ORAL | Status: DC

## 2013-06-11 MED ORDER — IBUPROFEN 800 MG PO TABS
800.0000 mg | ORAL_TABLET | Freq: Once | ORAL | Status: AC
  Administered 2013-06-11: 800 mg via ORAL
  Filled 2013-06-11: qty 1

## 2013-06-11 MED ORDER — KETOROLAC TROMETHAMINE 10 MG PO TABS: 10.0000 mg | ORAL_TABLET | Freq: Once | ORAL | Status: DC

## 2013-06-11 NOTE — ED Notes (Addendum)
Pt reports slipped and fell in the shower Wednesday.  C/O pain to left rib area.  Denies any SOB, c/o worsening pain with deep breaths or coughing.

## 2013-06-11 NOTE — ED Provider Notes (Signed)
CSN: [id number]     Arrival date & time 06/11/13  1135 History   First MD Initiated Contact with Patient 06/11/13 1318     Chief Complaint  Patient presents with  . Fall   (Consider location/radiation/quality/duration/timing/severity/associated sxs/prior Treatment) HPI Comments: The patient is a 52 year old female who states that on Wednesday decent the 17th she slipped in her tub and hit the safety rail. The patient states she's been having pain in her left rib area since that time. She has pain turning over she has pain sitting up. Patient states the pain is particularly bad when she coughs or takes a deep breath. Patient denies any hemoptysis. She's not had any blood in her urine. And she's not seen any blood in her stools.  Patient is a 52 y.o. female presenting with fall. The history is provided by the patient.  Fall Associated symptoms include arthralgias. Pertinent negatives include no abdominal pain, chest pain, coughing or neck pain.    Past Medical History  Diagnosis Date  . Asthma   . Hypertension   . Arthritis   . Reflux   . COPD (chronic obstructive pulmonary disease)   . Depression   . Anxiety    Past Surgical History  Procedure Laterality Date  . Laporscopy surgery for ovarian cyst  20 years ago  . Tonsillectomy    . Laparoscopy     Family History  Problem Relation Age of Onset  . Allergic rhinitis Paternal Grandfather   . Diabetes Father   . Hypertension Father 50    heart attack  . Breast cancer Mother 30    lump removed, bile duct cancer  . Hypertension Brother    History  Substance Use Topics  . Smoking status: Current Every Day Smoker -- 1.00 packs/day for 30 years    Types: Cigarettes  . Smokeless tobacco: Not on file     Comment: states "might be interested in the smoking cessation program"  Pt given counseling related to smoking cessation  . Alcohol Use: Yes     Comment: couple x a week   OB History   Grav Para Term Preterm Abortions TAB SAB  Ect Mult Living   3 1 1  1  1   1      Review of Systems  Constitutional: Negative for activity change.       All ROS Neg except as noted in HPI  HENT: Negative for nosebleeds.   Eyes: Negative for photophobia and discharge.  Respiratory: Positive for shortness of breath and wheezing. Negative for cough.   Cardiovascular: Negative for chest pain and palpitations.  Gastrointestinal: Negative for abdominal pain and blood in stool.  Genitourinary: Negative for dysuria, frequency and hematuria.  Musculoskeletal: Positive for arthralgias and back pain. Negative for neck pain.  Skin: Negative.   Neurological: Negative for dizziness, seizures and speech difficulty.  Psychiatric/Behavioral: Negative for hallucinations and confusion. The patient is nervous/anxious.     Allergies  Acetaminophen  Home Medications   Current Outpatient Rx  Name  Route  Sig  Dispense  Refill  . albuterol (PROVENTIL HFA;VENTOLIN HFA) 108 (90 BASE) MCG/ACT inhaler   Inhalation   Inhale 2 puffs into the lungs every 6 (six) hours as needed for wheezing.         Marland Kitchen amoxicillin (AMOXIL) 500 MG capsule   Oral   Take 1 capsule (500 mg total) by mouth 3 (three) times daily.   30 capsule   0   . clonazePAM (KLONOPIN) 1 MG  tablet   Oral   Take 1 mg by mouth daily as needed for anxiety.          . diphenhydrAMINE (BENADRYL) 25 MG tablet   Oral   Take 25 mg by mouth every 6 (six) hours as needed for itching or allergies.         Marland Kitchen gabapentin (NEURONTIN) 400 MG capsule   Oral   Take 400 mg by mouth 3 (three) times daily.         . Melatonin 3 MG CAPS   Oral   Take 1 capsule by mouth at bedtime.         . penicillin v potassium (VEETID) 250 MG tablet   Oral   Take 1 tablet (250 mg total) by mouth 4 (four) times daily.   20 tablet   0   . QUEtiapine (SEROQUEL) 400 MG tablet   Oral   Take 400 mg by mouth at bedtime.         . traMADol (ULTRAM) 50 MG tablet   Oral   Take 1 tablet (50 mg  total) by mouth every 6 (six) hours as needed.   15 tablet   0   . Vortioxetine HBr (BRINTELLIX) 10 MG TABS   Oral   Take 1 tablet by mouth daily.          BP 126/74  Pulse 90  Temp(Src) 97.3 F (36.3 C) (Oral)  Resp 18  Ht 5\' 6"  (1.676 m)  Wt 200 lb (90.719 kg)  BMI 32.30 kg/m2  SpO2 100%  LMP 02/22/2006 Physical Exam  Nursing note and vitals reviewed. Constitutional: She is oriented to person, place, and time. She appears well-developed and well-nourished.  Non-toxic appearance.  HENT:  Head: Normocephalic.  Right Ear: Tympanic membrane and external ear normal.  Left Ear: Tympanic membrane and external ear normal.  Eyes: EOM and lids are normal. Pupils are equal, round, and reactive to light.  Neck: Normal range of motion. Neck supple. Carotid bruit is not present.  Cardiovascular: Normal rate, regular rhythm, normal heart sounds, intact distal pulses and normal pulses.   Pulmonary/Chest: Breath sounds normal. No respiratory distress.  There is a large bruise area to the left flank area. There is tenderness to palpation along the chest wall lateral and anterior lateral region. There is no crepitus appreciated.  The patient takes very shallow breaths because of discomfort with breathing or taking a deep breath.  Abdominal: Soft. Bowel sounds are normal. There is no tenderness. There is no guarding.  Musculoskeletal: Normal range of motion.  Lymphadenopathy:       Head (right side): No submandibular adenopathy present.       Head (left side): No submandibular adenopathy present.    She has no cervical adenopathy.  Neurological: She is alert and oriented to person, place, and time. She has normal strength. No cranial nerve deficit or sensory deficit.  Skin: Skin is warm and dry.  Psychiatric: She has a normal mood and affect. Her speech is normal.    ED Course  Procedures (including critical care time) Labs Review Labs Reviewed - No data to display Imaging Review Dg  Ribs Unilateral W/chest Left  06/11/2013   CLINICAL DATA:  Pain post trauma  EXAM: LEFT RIBS AND CHEST - 3+ VIEW  COMPARISON:  Chest radiograph March 02, 2012  FINDINGS: Frontal chest as well as bilateral oblique and cone-down lower rib images were obtained. There are recent fractures of the anterior left 6th, 7th,  and 8th ribs. There is an older fracture of the left posterior 5th rib.  There is no edema or consolidation. Heart size and pulmonary vascularity are normal. No adenopathy.  No effusion or pneumothorax.  There is a hiatal type hernia present.  IMPRESSION: Recent fractures of the anterior left 6th, 7th, and 8th ribs. No pneumothorax. Lungs clear. Small hiatal hernia.   Electronically Signed   By: Bretta Bang M.D.   On: 06/11/2013 12:10    EKG Interpretation   None       MDM  No diagnosis found. **I have reviewed nursing notes, vital signs, and all appropriate lab and imaging results for this patient.*  X-ray of the left ribs revealed recent anterior fractures involving the left sixth, seventh, and eighth rib. The pulse oximetry is 100% on room air. Within normal dictation.  The patient is advised of the findings on the x-ray. She will be treated treated with Roxicodone, ibuprofen, and Robaxin patient is advised to see her primary physician for recheck. Patient is also given an incentive spirometer to use.  Kathie Dike, PA-C 06/11/13 [phone]

## 2013-06-11 NOTE — ED Provider Notes (Signed)
Medical screening examination/treatment/procedure(s) were performed by non-physician practitioner and as supervising physician I was immediately available for consultation/collaboration.  EKG Interpretation   None         Nilah Belcourt L Abri Vacca, MD 06/11/13 1524 

## 2013-06-18 ENCOUNTER — Encounter (HOSPITAL_COMMUNITY): Payer: Self-pay | Admitting: Emergency Medicine

## 2013-06-18 ENCOUNTER — Emergency Department (HOSPITAL_COMMUNITY)
Admission: EM | Admit: 2013-06-18 | Discharge: 2013-06-18 | Discharge status: Against Medical Advice | Payer: Self-pay | Attending: Emergency Medicine | Admitting: Emergency Medicine

## 2013-06-18 DIAGNOSIS — J449 Chronic obstructive pulmonary disease, unspecified: Secondary | ICD-10-CM | POA: Insufficient documentation

## 2013-06-18 DIAGNOSIS — R079 Chest pain, unspecified: Secondary | ICD-10-CM | POA: Insufficient documentation

## 2013-06-18 DIAGNOSIS — R05 Cough: Secondary | ICD-10-CM | POA: Insufficient documentation

## 2013-06-18 DIAGNOSIS — R059 Cough, unspecified: Secondary | ICD-10-CM | POA: Insufficient documentation

## 2013-06-18 DIAGNOSIS — F17200 Nicotine dependence, unspecified, uncomplicated: Secondary | ICD-10-CM | POA: Insufficient documentation

## 2013-06-18 DIAGNOSIS — I10 Essential (primary) hypertension: Secondary | ICD-10-CM | POA: Insufficient documentation

## 2013-06-18 DIAGNOSIS — J4489 Other specified chronic obstructive pulmonary disease: Secondary | ICD-10-CM | POA: Insufficient documentation

## 2013-06-18 DIAGNOSIS — J45909 Unspecified asthma, uncomplicated: Secondary | ICD-10-CM | POA: Insufficient documentation

## 2013-06-18 NOTE — ED Notes (Signed)
Cough and pain lt ribs. Seen here 12/22 for rib pain.

## 2013-06-18 NOTE — ED Notes (Signed)
Called to room from waiting room and outside, no answer.

## 2013-06-18 NOTE — ED Notes (Signed)
Called from waiting room to go to room with no answer.

## 2013-10-06 ENCOUNTER — Encounter (HOSPITAL_COMMUNITY): Payer: Self-pay | Admitting: Emergency Medicine

## 2013-10-06 ENCOUNTER — Emergency Department (INDEPENDENT_AMBULATORY_CARE_PROVIDER_SITE_OTHER)
Admission: EM | Admit: 2013-10-06 | Discharge: 2013-10-06 | Disposition: A | Discharge status: Home / Self Care | Payer: Self-pay | Source: Home / Self Care

## 2013-10-06 ENCOUNTER — Emergency Department (INDEPENDENT_AMBULATORY_CARE_PROVIDER_SITE_OTHER): Payer: Self-pay

## 2013-10-06 DIAGNOSIS — IMO0001 Reserved for inherently not codable concepts without codable children: Secondary | ICD-10-CM

## 2013-10-06 DIAGNOSIS — S20219A Contusion of unspecified front wall of thorax, initial encounter: Secondary | ICD-10-CM

## 2013-10-06 DIAGNOSIS — Z9181 History of falling: Secondary | ICD-10-CM

## 2013-10-06 DIAGNOSIS — S20212A Contusion of left front wall of thorax, initial encounter: Secondary | ICD-10-CM

## 2013-10-06 DIAGNOSIS — S2249XD Multiple fractures of ribs, unspecified side, subsequent encounter for fracture with routine healing: Secondary | ICD-10-CM

## 2013-10-06 DIAGNOSIS — R296 Repeated falls: Secondary | ICD-10-CM

## 2013-10-06 DIAGNOSIS — K047 Periapical abscess without sinus: Secondary | ICD-10-CM

## 2013-10-06 DIAGNOSIS — W1809XA Striking against other object with subsequent fall, initial encounter: Secondary | ICD-10-CM

## 2013-10-06 MED ORDER — AMOXICILLIN 500 MG PO CAPS: 500.0000 mg | ORAL_CAPSULE | Freq: Two times a day (BID) | ORAL | Status: AC

## 2013-10-06 MED ORDER — OXYCODONE HCL 5 MG PO TABS: 5.0000 mg | ORAL_TABLET | Freq: Four times a day (QID) | ORAL | Status: DC | PRN

## 2013-10-06 NOTE — ED Provider Notes (Signed)
CSN: [id number]     Arrival date & time 10/06/13  1157 History   None    Chief Complaint  Patient presents with  . Rib Injury  . Dental Pain   (Consider location/radiation/quality/duration/timing/severity/associated sxs/prior Treatment) HPI Comments: Patient presents with left sided rib pain; posterior > anterior. Fell on Monday and "hit a table". Pain has worsened with inability to sleep. Fell and fractured ribs in December from "being clumsy". No SOB is noted. No abdominal pain.  Patient also request antibiotic for new gum abscess. She reports that she has no intention of going to a dentist; she cannot afford.   Patient is a 53 y.o. female presenting with tooth pain. The history is provided by the patient.  Dental Pain   Past Medical History  Diagnosis Date  . Asthma   . Hypertension   . Arthritis   . Reflux   . COPD (chronic obstructive pulmonary disease)   . Depression   . Anxiety    Past Surgical History  Procedure Laterality Date  . Laporscopy surgery for ovarian cyst  20 years ago  . Tonsillectomy    . Laparoscopy     Family History  Problem Relation Age of Onset  . Allergic rhinitis Paternal Grandfather   . Diabetes Father   . Hypertension Father 50    heart attack  . Breast cancer Mother 30    lump removed, bile duct cancer  . Hypertension Brother    History  Substance Use Topics  . Smoking status: Current Every Day Smoker -- 1.00 packs/day for 30 years    Types: Cigarettes  . Smokeless tobacco: Not on file     Comment: states "might be interested in the smoking cessation program"  Pt given counseling related to smoking cessation  . Alcohol Use: Yes     Comment: couple x a week   OB History   Grav Para Term Preterm Abortions TAB SAB Ect Mult Living   3 1 1  1  1   1      Review of Systems  All other systems reviewed and are negative.   Allergies  Acetaminophen  Home Medications   Prior to Admission medications   Medication Sig Start Date End  Date Taking? Authorizing Provider  albuterol (PROVENTIL HFA;VENTOLIN HFA) 108 (90 BASE) MCG/ACT inhaler Inhale 2 puffs into the lungs every 6 (six) hours as needed for wheezing.    Historical Provider, MD  amoxicillin (AMOXIL) 500 MG capsule Take 1 capsule (500 mg total) by mouth 3 (three) times daily. 05/07/13   Tammy L. Triplett, PA-C  clonazePAM (KLONOPIN) 1 MG tablet Take 1 mg by mouth daily as needed for anxiety.     Historical Provider, MD  diphenhydrAMINE (BENADRYL) 25 MG tablet Take 25 mg by mouth every 6 (six) hours as needed for itching or allergies.    Historical Provider, MD  gabapentin (NEURONTIN) 400 MG capsule Take 400 mg by mouth 3 (three) times daily.    Historical Provider, MD  ibuprofen (ADVIL,MOTRIN) 800 MG tablet Take 1 tablet (800 mg total) by mouth every 8 (eight) hours as needed. 06/11/13   Kathie DikeHobson M Bryant, PA-C  Melatonin 3 MG CAPS Take 1 capsule by mouth at bedtime.    Historical Provider, MD  methocarbamol (ROBAXIN) 500 MG tablet Take 2 tablets (1,000 mg total) by mouth 3 (three) times daily. 06/11/13   Kathie DikeHobson M Bryant, PA-C  oxyCODONE (ROXICODONE) 5 MG immediate release tablet Take 1 tablet (5 mg total) by mouth every  6 (six) hours as needed for severe pain. 06/11/13   Kathie DikeHobson M Bryant, PA-C  penicillin v potassium (VEETID) 250 MG tablet Take 1 tablet (250 mg total) by mouth 4 (four) times daily. 02/09/13   Laray AngerKathleen M McManus, DO  QUEtiapine (SEROQUEL) 400 MG tablet Take 400 mg by mouth at bedtime.    Historical Provider, MD  traMADol (ULTRAM) 50 MG tablet Take 1 tablet (50 mg total) by mouth every 6 (six) hours as needed. 05/07/13   Tammy L. Triplett, PA-C  Vortioxetine HBr (BRINTELLIX) 10 MG TABS Take 1 tablet by mouth daily.    Historical Provider, MD   BP 92/70  Pulse 116  Temp(Src) 98.1 F (36.7 C) (Oral)  Resp 18  SpO2 98%  LMP 02/22/2006 Physical Exam  Nursing note and vitals reviewed. Constitutional: She is oriented to person, place, and time. She appears  well-developed and well-nourished. No distress.  Smell of alcohol in the room  HENT:  Head: Normocephalic and atraumatic.  Dental carries and poor denitician  Eyes: Pupils are equal, round, and reactive to light.  Cardiovascular: Normal rate and regular rhythm.   Pulmonary/Chest: Effort normal and breath sounds normal. No respiratory distress. She has no wheezes. She has no rales. She exhibits tenderness.  Small ecchymosis on the lower left abdomen and posterior ribs. Pain with palpation along the lower below diaphragm ribs.   Neurological: She is alert and oriented to person, place, and time.  Skin: Skin is warm and dry. She is not diaphoretic.  Psychiatric: Her behavior is normal.    ED Course  Procedures (including critical care time) Labs Review Labs Reviewed - No data to display  Results for orders placed during the hospital encounter of 02/08/13  CBC      Result Value Ref Range   WBC 7.2  4.0 - 10.5 K/uL   RBC 4.30  3.87 - 5.11 MIL/uL   Hemoglobin 12.5  12.0 - 15.0 g/dL   HCT 11.937.3  14.736.0 - 82.946.0 %   MCV 86.7  78.0 - 100.0 fL   MCH 29.1  26.0 - 34.0 pg   MCHC 33.5  30.0 - 36.0 g/dL   RDW 56.214.8  13.011.5 - 86.515.5 %   Platelets 289  150 - 400 K/uL  COMPREHENSIVE METABOLIC PANEL      Result Value Ref Range   Sodium 133 (*) 135 - 145 mEq/L   Potassium 3.8  3.5 - 5.1 mEq/L   Chloride 97  96 - 112 mEq/L   CO2 21  19 - 32 mEq/L   Glucose, Bld 102 (*) 70 - 99 mg/dL   BUN 12  6 - 23 mg/dL   Creatinine, Ser 7.841.01  0.50 - 1.10 mg/dL   Calcium 9.2  8.4 - 69.610.5 mg/dL   Total Protein 7.4  6.0 - 8.3 g/dL   Albumin 3.8  3.5 - 5.2 g/dL   AST 52 (*) 0 - 37 U/L   ALT 37 (*) 0 - 35 U/L   Alkaline Phosphatase 123 (*) 39 - 117 U/L   Total Bilirubin 0.4  0.3 - 1.2 mg/dL   GFR calc non Af Amer 64 (*) >90 mL/min   GFR calc Af Amer 74 (*) >90 mL/min  ETHANOL      Result Value Ref Range   Alcohol, Ethyl (B) <11  0 - 11 mg/dL  URINE RAPID DRUG SCREEN (HOSP PERFORMED)      Result Value Ref Range    Opiates NONE DETECTED  NONE DETECTED  Cocaine NONE DETECTED  NONE DETECTED   Benzodiazepines NONE DETECTED  NONE DETECTED   Amphetamines NONE DETECTED  NONE DETECTED   Tetrahydrocannabinol NONE DETECTED  NONE DETECTED   Barbiturates NONE DETECTED  NONE DETECTED   Imaging Review Dg Ribs Unilateral W/chest Left  10/06/2013   CLINICAL DATA:  Posterior left lower rib pain for the past 6 days.  EXAM: LEFT RIBS AND CHEST - 3+ VIEW  COMPARISON:  Chest x-ray 06/11/2013.  FINDINGS: Lung volumes are markedly low. Bibasilar opacities favored to reflect subsegmental atelectasis. No definite pleural effusions. No pneumothorax. No evidence of pulmonary edema. Heart size is borderline enlarged. Mediastinal contours are distorted by patient positioning.  Old healed fracture of the posterolateral aspect of the left sixth rib. There also appear to be healing nondisplaced mildly angulated fractures of the lateral aspects of the left eighth, ninth and tenth ribs.  IMPRESSION: 1. Multiple healing nondisplaced fractures of the lateral aspects of the left eighth, ninth and tenth ribs. No associated pneumothorax at this time. 2. Old healed fracture of the posterolateral aspect of the left sixth rib. 3. Low lung volumes with probable bibasilar subsegmental atelectasis.   Electronically Signed   By: Trudie Reed M.D.   On: 10/06/2013 14:08     MDM   1. Contusion of rib on left side   2. Multiple fractures of ribs with routine healing   3. Falls frequently   4. Dental abscess   1. No new fractures-Treat with rest/ice/heat/prn pain medications (use sparingly) 2. Fall safety 3. Cover with Amox, advise Dentist though she admits to not going to do this.    Riki Sheer, PA-C 10/06/13 [phone]

## 2013-10-06 NOTE — ED Notes (Signed)
Pt c/o pain on left side/rib cage onset monday Reports she fell onto wooden table Pain increases w/activity  Also c/o upper dental pain onset 2 weeks Alert w/no signs of acute distress.

## 2013-10-06 NOTE — Discharge Instructions (Signed)
Abscessed Tooth An abscessed tooth is an infection around your tooth. It may be caused by holes or damage to the tooth (cavity) or a dental disease. An abscessed tooth causes mild to very bad pain in and around the tooth. See your dentist right away if you have tooth or gum pain. HOME CARE  Take your medicine as told. Finish it even if you start to feel better.  Do not drive after taking pain medicine.  Rinse your mouth (gargle) often with salt water ( teaspoon salt in 8 ounces of warm water).  Do not apply heat to the outside of your face. GET HELP RIGHT AWAY IF:   You have a temperature by mouth above 102 F (38.9 C), not controlled by medicine.  You have chills and a very bad headache.  You have problems breathing or swallowing.  Your mouth will not open.  You develop puffiness (swelling) on the neck or around the eye.  Your pain is not helped by medicine.  Your pain is getting worse instead of better. MAKE SURE YOU:   Understand these instructions.  Will watch your condition.  Will get help right away if you are not doing well or get worse. Document Released: 11/24/2007 Document Revised: 08/30/2011 Document Reviewed: 09/15/2010 [facility] Patient Information 2014 [place], [place].  Fall Prevention and Home Safety Falls cause injuries and can affect all age groups. It is possible to use preventive measures to significantly decrease the likelihood of falls. There are many simple measures which can make your home safer and prevent falls. OUTDOORS  Repair cracks and edges of walkways and driveways.  Remove high doorway thresholds.  Trim shrubbery on the main path into your home.  Have good outside lighting.  Clear walkways of tools, rocks, debris, and clutter.  Check that handrails are not broken and are securely fastened. Both sides of steps should have handrails.  Have leaves, snow, and ice cleared regularly.  Use sand or salt on walkways during winter  months.  In the garage, clean up grease or oil spills. BATHROOM  Install night lights.  Install grab bars by the toilet and in the tub and shower.  Use non-skid mats or decals in the tub or shower.  Place a plastic non-slip stool in the shower to sit on, if needed.  Keep floors dry and clean up all water on the floor immediately.  Remove soap buildup in the tub or shower on a regular basis.  Secure bath mats with non-slip, double-sided rug tape.  Remove throw rugs and tripping hazards from the floors. BEDROOMS  Install night lights.  Make sure a bedside light is easy to reach.  Do not use oversized bedding.  Keep a telephone by your bedside.  Have a firm chair with side arms to use for getting dressed.  Remove throw rugs and tripping hazards from the floor. KITCHEN  Keep handles on pots and pans turned toward the center of the stove. Use back burners when possible.  Clean up spills quickly and allow time for drying.  Avoid walking on wet floors.  Avoid hot utensils and knives.  Position shelves so they are not too high or low.  Place commonly used objects within easy reach.  If necessary, use a sturdy step stool with a grab bar when reaching.  Keep electrical cables out of the way.  Do not use floor polish or wax that makes floors slippery. If you must use wax, use non-skid floor wax.  Remove throw rugs and  tripping hazards from the floor. STAIRWAYS  Never leave objects on stairs.  Place handrails on both sides of stairways and use them. Fix any loose handrails. Make sure handrails on both sides of the stairways are as long as the stairs.  Check carpeting to make sure it is firmly attached along stairs. Make repairs to worn or loose carpet promptly.  Avoid placing throw rugs at the top or bottom of stairways, or properly secure the rug with carpet tape to prevent slippage. Get rid of throw rugs, if possible.  Have an electrician put in a light switch at  the top and bottom of the stairs. OTHER FALL PREVENTION TIPS  Wear low-heel or rubber-soled shoes that are supportive and fit well. Wear closed toe shoes.  When using a stepladder, make sure it is fully opened and both spreaders are firmly locked. Do not climb a closed stepladder.  Add color or contrast paint or tape to grab bars and handrails in your home. Place contrasting color strips on first and last steps.  Learn and use mobility aids as needed. Install an electrical emergency response system.  Turn on lights to avoid dark areas. Replace light bulbs that burn out immediately. Get light switches that glow.  Arrange furniture to create clear pathways. Keep furniture in the same place.  Firmly attach carpet with non-skid or double-sided tape.  Eliminate uneven floor surfaces.  Select a carpet pattern that does not visually hide the edge of steps.  Be aware of all pets. OTHER HOME SAFETY TIPS  Set the water temperature for 120 F (48.8 C).  Keep emergency numbers on or near the telephone.  Keep smoke detectors on every level of the home and near sleeping areas. Document Released: 05/28/2002 Document Revised: 12/07/2011 Document Reviewed: 08/27/2011         [facility]Exit[facility] Patient Information 2014 [place]ExitCare, [place].

## 2013-10-06 NOTE — ED Provider Notes (Signed)
Medical screening examination/treatment/procedure(s) were performed by a resident physician or non-physician practitioner and as the supervising physician I was immediately available for consultation/collaboration.  Tuck Dulworth, MD    Karissa Meenan S Ricki Vanhandel, MD 10/06/13 2001 

## 2013-11-03 ENCOUNTER — Emergency Department (INDEPENDENT_AMBULATORY_CARE_PROVIDER_SITE_OTHER)
Admission: EM | Admit: 2013-11-03 | Discharge: 2013-11-03 | Disposition: A | Discharge status: Home / Self Care | Payer: Self-pay | Source: Home / Self Care | Attending: Emergency Medicine | Admitting: Emergency Medicine

## 2013-11-03 ENCOUNTER — Encounter (HOSPITAL_COMMUNITY): Payer: Self-pay | Admitting: Emergency Medicine

## 2013-11-03 DIAGNOSIS — K089 Disorder of teeth and supporting structures, unspecified: Secondary | ICD-10-CM

## 2013-11-03 DIAGNOSIS — K029 Dental caries, unspecified: Secondary | ICD-10-CM

## 2013-11-03 DIAGNOSIS — K0889 Other specified disorders of teeth and supporting structures: Secondary | ICD-10-CM

## 2013-11-03 MED ORDER — HYDROCODONE-ACETAMINOPHEN 5-325 MG PO TABS: 1.0000 | ORAL_TABLET | Freq: Four times a day (QID) | ORAL | Status: DC | PRN

## 2013-11-03 MED ORDER — IBUPROFEN 800 MG PO TABS: 800.0000 mg | ORAL_TABLET | Freq: Three times a day (TID) | ORAL | Status: DC

## 2013-11-03 MED ORDER — AMOXICILLIN 875 MG PO TABS
875.0000 mg | ORAL_TABLET | Freq: Two times a day (BID) | ORAL | Status: DC
Start: 2013-11-03 — End: 2015-06-16

## 2013-11-03 NOTE — Discharge Instructions (Signed)
Dental Caries  °Dental caries (also called tooth decay) is the most common oral disease. It can occur at any age, but is more common in children and young adults.  °HOW DENTAL CARIES DEVELOPS  °The process of decay begins when bacteria and foods (particularly sugars and starches) combine in your mouth to produce plaque. Plaque is a substance that sticks to the hard, outer surface of a tooth (enamel). The bacteria in plaque produce acids that attack enamel. These acids may also attack the root surface of a tooth (cementum) if it is exposed. Repeated attacks dissolve these surfaces and create holes in the tooth (cavities). If left untreated, the acids destroy the other layers of the tooth.  °RISK FACTORS °· Frequent sipping of sugary beverages.   °· Frequent snacking on sugary and starchy foods, especially those that easily get stuck in the teeth.   °· Poor oral hygiene.   °· Dry mouth.   °· Substance abuse such as methamphetamine abuse.   °· Broken or poor-fitting dental restorations.   °· Eating disorders.   °· Gastroesophageal reflux disease (GERD).   °· Certain radiation treatments to the head and neck. °SYMPTOMS °In the early stages of dental caries, symptoms are seldom present. Sometimes white, chalky areas may be seen on the enamel or other tooth layers. In later stages, symptoms may include: °· Pits and holes on the enamel. °· Toothache after sweet, hot, or cold foods or drinks are consumed. °· Pain around the tooth. °· Swelling around the tooth. °DIAGNOSIS  °Most of the time, dental caries is detected during a regular dental checkup. A diagnosis is made after a thorough medical and dental history is taken and the surfaces of your teeth are checked for signs of dental caries. Sometimes special instruments, such as lasers, are used to check for dental caries. Dental X-ray exams may be taken so that areas not visible to the eye (such as between the contact areas of the teeth) can be checked for cavities.    °TREATMENT  °If dental caries is in its early stages, it may be reversed with a fluoride treatment or an application of a remineralizing agent at the dental office. Thorough brushing and flossing at home is needed to aid these treatments. If it is in its later stages, treatment depends on the location and extent of tooth destruction:  °· If a small area of the tooth has been destroyed, the destroyed area will be removed and cavities will be filled with a material such as gold, silver amalgam, or composite resin.   °· If a large area of the tooth has been destroyed, the destroyed area will be removed and a cap (crown) will be fitted over the remaining tooth structure.   °· If the center part of the tooth (pulp) is affected, a procedure called a root canal will be needed before a filling or crown can be placed.   °· If most of the tooth has been destroyed, the tooth may need to be pulled (extracted). °HOME CARE INSTRUCTIONS °You can prevent, stop, or reverse dental caries at home by practicing good oral hygiene. Good oral hygiene includes: °· Thoroughly cleaning your teeth at least twice a day with a toothbrush and dental floss.   °· Using a fluoride toothpaste. A fluoride mouth rinse may also be used if recommended by your dentist or health care provider.   °· Restricting the amount of sugary and starchy foods and sugary liquids you consume.   °· Avoiding frequent snacking on these foods and sipping of these liquids.   °· Keeping regular visits   with a dentist for checkups and cleanings. °PREVENTION  °· Practice good oral hygiene. °· Consider a dental sealant. A dental sealant is a coating material that is applied by your dentist to the pits and grooves of teeth. The sealant prevents food from being trapped in them. It may protect the teeth for several years. °· Ask about fluoride supplements if you live in a community without fluorinated water or with water that has a low fluoride content. Use fluoride supplements  as directed by your dentist or health care provider. °· Allow fluoride varnish applications to teeth if directed by your dentist or health care provider. °Document Released: 02/27/2002 Document Revised: 02/07/2013 Document Reviewed: 06/09/2012 °ExitCare® Patient Information ©2014 [employer]. ° °Dental Pain °A tooth ache may be caused by cavities (tooth decay). Cavities expose the nerve of the tooth to air and hot or cold temperatures. It may come from an infection or abscess (also called a boil or furuncle) around your tooth. It is also often caused by dental caries (tooth decay). This causes the pain you are having. °DIAGNOSIS  °Your caregiver can diagnose this problem by exam. °TREATMENT  °· If caused by an infection, it may be treated with medications which kill germs (antibiotics) and pain medications as prescribed by your caregiver. Take medications as directed. °· Only take over-the-counter or prescription medicines for pain, discomfort, or fever as directed by your caregiver. °· Whether the tooth ache today is caused by infection or dental disease, you should see your dentist as soon as possible for further care. °SEEK MEDICAL CARE IF: °The exam and treatment you received today has been provided on an emergency basis only. This is not a substitute for complete medical or dental care. If your problem worsens or new problems (symptoms) appear, and you are unable to meet with your dentist, call or return to this location. °SEEK IMMEDIATE MEDICAL CARE IF:  °· You have a fever. °· You develop redness and swelling of your face, jaw, or neck. °· You are unable to open your mouth. °· You have severe pain uncontrolled by pain medicine. °MAKE SURE YOU:  °· Understand these instructions. °· Will watch your condition. °· Will get help right away if you are not doing well or get worse. °Document Released: 06/07/2005 Document Revised: 08/30/2011 Document Reviewed: 01/24/2008 °ExitCare® Patient Information ©2014  [employer], [employer]. ° °

## 2013-11-03 NOTE — ED Provider Notes (Signed)
CSN: [id number]     Arrival date & time 11/03/13  1726 History   First MD Initiated Contact with Patient 11/03/13 1828     Chief Complaint  Patient presents with  . Dental Pain   (Consider location/radiation/quality/duration/timing/severity/associated sxs/prior Treatment) HPI Comments: 53 year old female presents complaining of dental pain. She has dental caries and she feels like she has gotten an abscess. It has already partially drained. She is still having severe pain for the past 3 days. She's taking Tylenol and ibuprofen but it is not helping. She has tried Orajel which is also not helpful. She has had some purulent drainage into her mouth she thinks yesterday. No fever, chills. She is able to open and close her mouth completely without pain. She has had this problem before. She does not have a dentist.  Patient is a 53 y.o. female presenting with tooth pain.  Dental Pain   Past Medical History  Diagnosis Date  . Asthma   . Hypertension   . Arthritis   . Reflux   . COPD (chronic obstructive pulmonary disease)   . Depression   . Anxiety    Past Surgical History  Procedure Laterality Date  . Laporscopy surgery for ovarian cyst  20 years ago  . Tonsillectomy    . Laparoscopy     Family History  Problem Relation Age of Onset  . Allergic rhinitis Paternal Grandfather   . Diabetes Father   . Hypertension Father 50    heart attack  . Breast cancer Mother 30    lump removed, bile duct cancer  . Hypertension Brother    History  Substance Use Topics  . Smoking status: Current Every Day Smoker -- 1.00 packs/day for 30 years    Types: Cigarettes  . Smokeless tobacco: Not on file     Comment: states "might be interested in the smoking cessation program"  Pt given counseling related to smoking cessation  . Alcohol Use: Yes     Comment: couple x a week   OB History   Grav Para Term Preterm Abortions TAB SAB Ect Mult Living   3 1 1  1  1   1      Review of Systems  HENT:  Positive for dental problem.   All other systems reviewed and are negative.   Allergies  Acetaminophen  Home Medications   Prior to Admission medications   Medication Sig Start Date End Date Taking? Authorizing Provider  gabapentin (NEURONTIN) 400 MG capsule Take 400 mg by mouth 3 (three) times daily.   Yes Historical Provider, MD  QUEtiapine (SEROQUEL) 400 MG tablet Take 400 mg by mouth at bedtime.   Yes Historical Provider, MD  albuterol (PROVENTIL HFA;VENTOLIN HFA) 108 (90 BASE) MCG/ACT inhaler Inhale 2 puffs into the lungs every 6 (six) hours as needed for wheezing.    Historical Provider, MD  clonazePAM (KLONOPIN) 1 MG tablet Take 1 mg by mouth daily as needed for anxiety.     Historical Provider, MD  diphenhydrAMINE (BENADRYL) 25 MG tablet Take 25 mg by mouth every 6 (six) hours as needed for itching or allergies.    Historical Provider, MD  ibuprofen (ADVIL,MOTRIN) 800 MG tablet Take 1 tablet (800 mg total) by mouth every 8 (eight) hours as needed. 06/11/13   Kathie DikeHobson M Bryant, PA-C  Melatonin 3 MG CAPS Take 1 capsule by mouth at bedtime.    Historical Provider, MD  methocarbamol (ROBAXIN) 500 MG tablet Take 2 tablets (1,000 mg total) by mouth 3 (  three) times daily. 06/11/13   Kathie DikeHobson M Bryant, PA-C  oxyCODONE (ROXICODONE) 5 MG immediate release tablet Take 1 tablet (5 mg total) by mouth every 6 (six) hours as needed for severe pain. 10/06/13   Riki SheerMichelle G Young, PA-C  penicillin v potassium (VEETID) 250 MG tablet Take 1 tablet (250 mg total) by mouth 4 (four) times daily. 02/09/13   Laray AngerKathleen M McManus, DO  traMADol (ULTRAM) 50 MG tablet Take 1 tablet (50 mg total) by mouth every 6 (six) hours as needed. 05/07/13   Tammy L. Triplett, PA-C  Vortioxetine HBr (BRINTELLIX) 10 MG TABS Take 1 tablet by mouth daily.    Historical Provider, MD   BP 113/72  Pulse 107  Temp(Src) 98.7 F (37.1 C) (Oral)  Resp 16  SpO2 97%  LMP 02/22/2006 Physical Exam  Nursing note and vitals  reviewed. Constitutional: She is oriented to person, place, and time. Vital signs are normal. She appears well-developed and well-nourished. No distress.  HENT:  Head: Normocephalic and atraumatic.  Mouth/Throat: Uvula is midline, oropharynx is clear and moist and mucous membranes are normal. No trismus in the jaw. Abnormal dentition. Dental caries (severe) present. No dental abscesses.  Pulmonary/Chest: Effort normal. No respiratory distress.  Neurological: She is alert and oriented to person, place, and time. She has normal strength. Coordination normal.  Skin: Skin is warm and dry. No rash noted. She is not diaphoretic.  Psychiatric: She has a normal mood and affect. Judgment normal.    ED Course  Procedures (including critical care time) Labs Review Labs Reviewed - No data to display  Imaging Review No results found.   MDM   1. Dental caries   2. Toothache    Severe dental caries with toothache, she needs to see a dentist. Will prescribe amoxicillin, and ibuprofen/Norco for pain control. Followup with a dentist.  Meds ordered this encounter  Medications  . amoxicillin (AMOXIL) 875 MG tablet    Sig: Take 1 tablet (875 mg total) by mouth 2 (two) times daily.    Dispense:  20 tablet    Refill:  0    Order Specific Question:  Supervising Provider    Answer:  Lorenz CoasterKELLER, DAVID C [id number]  . HYDROcodone-acetaminophen (NORCO) 5-325 MG per tablet    Sig: Take 1 tablet by mouth every 6 (six) hours as needed for moderate pain.    Dispense:  15 tablet    Refill:  0    Order Specific Question:  Supervising Provider    Answer:  Lorenz CoasterKELLER, DAVID C [id number]  . ibuprofen (ADVIL,MOTRIN) 800 MG tablet    Sig: Take 1 tablet (800 mg total) by mouth 3 (three) times daily.    Dispense:  60 tablet    Refill:  0    Order Specific Question:  Supervising Provider    Answer:  Lorenz CoasterKELLER, DAVID C [[id number]]      Graylon GoodZachary H Rolla Kedzierski, PA-C 11/03/13 1919

## 2013-11-03 NOTE — ED Notes (Signed)
Pt c/o dental pain onset 2 weeks Seen here on 10/06/13 for similar sx Taking OTC pain meds w/no relief Alert w/no signs of acute distress.

## 2013-11-05 NOTE — ED Provider Notes (Signed)
Medical screening examination/treatment/procedure(s) were performed by non-physician practitioner and as supervising physician I was immediately available for consultation/collaboration.  Leslee Homeavid Royale Lennartz, M.D.  Reuben Likesavid C Keimora Swartout, MD 11/05/13 [phone]

## 2014-01-04 ENCOUNTER — Emergency Department (HOSPITAL_COMMUNITY)
Admission: EM | Admit: 2014-01-04 | Discharge: 2014-01-04 | Disposition: A | Discharge status: Home / Self Care | Payer: Self-pay | Attending: Emergency Medicine | Admitting: Emergency Medicine

## 2014-01-04 ENCOUNTER — Encounter (HOSPITAL_COMMUNITY): Payer: Self-pay | Admitting: Emergency Medicine

## 2014-01-04 DIAGNOSIS — F17200 Nicotine dependence, unspecified, uncomplicated: Secondary | ICD-10-CM | POA: Insufficient documentation

## 2014-01-04 DIAGNOSIS — F329 Major depressive disorder, single episode, unspecified: Secondary | ICD-10-CM | POA: Insufficient documentation

## 2014-01-04 DIAGNOSIS — Z791 Long term (current) use of non-steroidal anti-inflammatories (NSAID): Secondary | ICD-10-CM | POA: Insufficient documentation

## 2014-01-04 DIAGNOSIS — J45909 Unspecified asthma, uncomplicated: Secondary | ICD-10-CM | POA: Insufficient documentation

## 2014-01-04 DIAGNOSIS — K029 Dental caries, unspecified: Secondary | ICD-10-CM | POA: Insufficient documentation

## 2014-01-04 DIAGNOSIS — F3289 Other specified depressive episodes: Secondary | ICD-10-CM | POA: Insufficient documentation

## 2014-01-04 DIAGNOSIS — J4489 Other specified chronic obstructive pulmonary disease: Secondary | ICD-10-CM | POA: Insufficient documentation

## 2014-01-04 DIAGNOSIS — F411 Generalized anxiety disorder: Secondary | ICD-10-CM | POA: Insufficient documentation

## 2014-01-04 DIAGNOSIS — J449 Chronic obstructive pulmonary disease, unspecified: Secondary | ICD-10-CM | POA: Insufficient documentation

## 2014-01-04 DIAGNOSIS — Z79899 Other long term (current) drug therapy: Secondary | ICD-10-CM | POA: Insufficient documentation

## 2014-01-04 DIAGNOSIS — Z792 Long term (current) use of antibiotics: Secondary | ICD-10-CM | POA: Insufficient documentation

## 2014-01-04 DIAGNOSIS — M129 Arthropathy, unspecified: Secondary | ICD-10-CM | POA: Insufficient documentation

## 2014-01-04 DIAGNOSIS — K047 Periapical abscess without sinus: Secondary | ICD-10-CM | POA: Insufficient documentation

## 2014-01-04 DIAGNOSIS — I10 Essential (primary) hypertension: Secondary | ICD-10-CM | POA: Insufficient documentation

## 2014-01-04 MED ORDER — HYDROCODONE-ACETAMINOPHEN 5-325 MG PO TABS
1.0000 | ORAL_TABLET | ORAL | Status: DC | PRN
  Administered 2014-01-04: 1 via ORAL
  Filled 2014-01-04: qty 1

## 2014-01-04 MED ORDER — AMOXICILLIN 500 MG PO CAPS: 500.0000 mg | ORAL_CAPSULE | Freq: Three times a day (TID) | ORAL | Status: AC

## 2014-01-04 MED ORDER — HYDROCODONE-ACETAMINOPHEN 5-325 MG PO TABS: 1.0000 | ORAL_TABLET | ORAL | Status: DC | PRN

## 2014-01-04 MED ORDER — AMOXICILLIN 250 MG PO CAPS
500.0000 mg | ORAL_CAPSULE | Freq: Once | ORAL | Status: AC
  Administered 2014-01-04: 500 mg via ORAL
  Filled 2014-01-04: qty 2

## 2014-01-04 NOTE — Discharge Instructions (Signed)
Abscessed Tooth °An abscessed tooth is an infection around your tooth. It may be caused by holes or damage to the tooth (cavity) or a dental disease. An abscessed tooth causes mild to very bad pain in and around the tooth. See your dentist right away if you have tooth or gum pain. °HOME CARE °· Take your medicine as told. Finish it even if you start to feel better. °· Do not drive after taking pain medicine. °· Rinse your mouth (gargle) often with salt water (¼ teaspoon salt in 8 ounces of warm water). °· Do not apply heat to the outside of your face. °GET HELP RIGHT AWAY IF:  °· You have a temperature by mouth above 102° F (38.9° C), not controlled by medicine. °· You have chills and a very bad headache. °· You have problems breathing or swallowing. °· Your mouth will not open. °· You develop puffiness (swelling) on the neck or around the eye. °· Your pain is not helped by medicine. °· Your pain is getting worse instead of better. °MAKE SURE YOU:  °· Understand these instructions. °· Will watch your condition. °· Will get help right away if you are not doing well or get worse. °Document Released: 11/24/2007 Document Revised: 08/30/2011 Document Reviewed: 09/15/2010 °ExitCare® Patient Information ©2015 [employer]. This information is not intended to replace advice given to you by your health care provider. Make sure you discuss any questions you have with your health care provider. ° ° ° °Complete your entire course of antibiotics as prescribed.  You  may use the hydrocodone for pain relief but do not drive within 4 hours of taking as this will make you drowsy.  Avoid applying heat or ice to this abscess area which can worsen your symptoms.  You may use warm salt water swish and spit treatment or half peroxide and water swish and spit after meals to keep this area clean as discussed.  Call the dentist listed above for further management of your symptoms. ° ° °

## 2014-01-04 NOTE — ED Notes (Signed)
Patient complaining of dental abscess x 2 days.

## 2014-01-05 NOTE — ED Provider Notes (Signed)
CSN: [id number]     Arrival date & time 01/04/14  1934 History   First MD Initiated Contact with Patient 01/04/14 2131     Chief Complaint  Patient presents with  . Dental Pain     (Consider location/radiation/quality/duration/timing/severity/associated sxs/prior Treatment) Patient is a 53 y.o. female presenting with tooth pain. The history is provided by the patient.  Dental Pain Location:  Upper Upper teeth location:  8/RU central incisor, 9/LU central incisor, 10/LU lateral incisor and 7/RU lateral incisor Quality:  Shooting and sharp Severity:  Severe Onset quality:  Gradual Duration:  2 days Timing:  Constant Progression:  Worsening Chronicity:  Recurrent Context: abscess, dental caries, dental fracture and poor dentition   Context: not trauma   Prior workup: none.  she is arranging a visit to [facility] in [place] for consideration of upper denture. Relieved by:  Nothing Worsened by:  Pressure and touching Ineffective treatments:  Acetaminophen Associated symptoms: no facial swelling, no fever and no neck pain   Risk factors: lack of dental care and smoking     Past Medical History  Diagnosis Date  . Asthma   . Hypertension   . Arthritis   . Reflux   . COPD (chronic obstructive pulmonary disease)   . Depression   . Anxiety    Past Surgical History  Procedure Laterality Date  . Laporscopy surgery for ovarian cyst  20 years ago  . Tonsillectomy    . Laparoscopy     Family History  Problem Relation Age of Onset  . Allergic rhinitis Paternal Grandfather   . Diabetes Father   . Hypertension Father 50    heart attack  . Breast cancer Mother 30    lump removed, bile duct cancer  . Hypertension Brother    History  Substance Use Topics  . Smoking status: Current Every Day Smoker -- 1.00 packs/day for 30 years    Types: Cigarettes  . Smokeless tobacco: Not on file     Comment: states "might be interested in the smoking cessation program"  Pt given  counseling related to smoking cessation  . Alcohol Use: Yes     Comment: couple x a week   OB History   Grav Para Term Preterm Abortions TAB SAB Ect Mult Living   3 1 1  1  1   1      Review of Systems  Constitutional: Negative for fever.  HENT: Positive for dental problem. Negative for facial swelling and sore throat.   Respiratory: Negative for shortness of breath.   Musculoskeletal: Negative for neck pain and neck stiffness.      Allergies  Review of patient's allergies indicates no known allergies.  Home Medications   Prior to Admission medications   Medication Sig Start Date End Date Taking? Authorizing Provider  albuterol (PROVENTIL HFA;VENTOLIN HFA) 108 (90 BASE) MCG/ACT inhaler Inhale 2 puffs into the lungs every 6 (six) hours as needed for wheezing.    Historical Provider, MD  amoxicillin (AMOXIL) 500 MG capsule Take 1 capsule (500 mg total) by mouth 3 (three) times daily. 01/04/14 01/14/14  Burgess AmorJulie Chou Busler, PA-C  amoxicillin (AMOXIL) 875 MG tablet Take 1 tablet (875 mg total) by mouth 2 (two) times daily. 11/03/13   Adrian BlackwaterZachary H Baker, PA-C  clonazePAM (KLONOPIN) 1 MG tablet Take 1 mg by mouth daily as needed for anxiety.     Historical Provider, MD  diphenhydrAMINE (BENADRYL) 25 MG tablet Take 25 mg by mouth every 6 (six) hours as needed for  itching or allergies.    Historical Provider, MD  gabapentin (NEURONTIN) 400 MG capsule Take 400 mg by mouth 3 (three) times daily.    Historical Provider, MD  HYDROcodone-acetaminophen (NORCO) 5-325 MG per tablet Take 1 tablet by mouth every 6 (six) hours as needed for moderate pain. 11/03/13   Graylon Good, PA-C  HYDROcodone-acetaminophen (NORCO/VICODIN) 5-325 MG per tablet Take 1 tablet by mouth every 4 (four) hours as needed. 01/04/14   Burgess Amor, PA-C  ibuprofen (ADVIL,MOTRIN) 800 MG tablet Take 1 tablet (800 mg total) by mouth every 8 (eight) hours as needed. 06/11/13   Kathie Dike, PA-C  ibuprofen (ADVIL,MOTRIN) 800 MG tablet Take 1  tablet (800 mg total) by mouth 3 (three) times daily. 11/03/13   Graylon Good, PA-C  Melatonin 3 MG CAPS Take 1 capsule by mouth at bedtime.    Historical Provider, MD  methocarbamol (ROBAXIN) 500 MG tablet Take 2 tablets (1,000 mg total) by mouth 3 (three) times daily. 06/11/13   Kathie Dike, PA-C  oxyCODONE (ROXICODONE) 5 MG immediate release tablet Take 1 tablet (5 mg total) by mouth every 6 (six) hours as needed for severe pain. 10/06/13   Riki Sheer, PA-C  penicillin v potassium (VEETID) 250 MG tablet Take 1 tablet (250 mg total) by mouth 4 (four) times daily. 02/09/13   Laray Anger, DO  QUEtiapine (SEROQUEL) 400 MG tablet Take 400 mg by mouth at bedtime.    Historical Provider, MD  traMADol (ULTRAM) 50 MG tablet Take 1 tablet (50 mg total) by mouth every 6 (six) hours as needed. 05/07/13   Tammy L. Triplett, PA-C  Vortioxetine HBr (BRINTELLIX) 10 MG TABS Take 1 tablet by mouth daily.    Historical Provider, MD   BP 114/83  Pulse 91  Temp(Src) 97.4 F (36.3 C) (Oral)  Resp 16  Ht 5\' 6"  (1.676 m)  Wt 190 lb (86.183 kg)  BMI 30.68 kg/m2  SpO2 100%  LMP 02/22/2006 Physical Exam  Constitutional: She is oriented to person, place, and time. She appears well-developed and well-nourished. No distress.  HENT:  Head: Normocephalic and atraumatic.  Right Ear: Tympanic membrane and external ear normal.  Left Ear: Tympanic membrane and external ear normal.  Mouth/Throat: Oropharynx is clear and moist and mucous membranes are normal. No oral lesions. No trismus in the jaw. Dental abscesses present.  Poor dentition.  Upper central and lateral incisors with advanced decay and fracture.  Edema and erythema of surrounding gingiva.  No fluctuance.  No facial edema or erythema.    Eyes: Conjunctivae are normal.  Neck: Normal range of motion. Neck supple.  Cardiovascular: Normal rate and normal heart sounds.   Pulmonary/Chest: Effort normal.  Abdominal: She exhibits no distension.   Musculoskeletal: Normal range of motion.  Lymphadenopathy:    She has no cervical adenopathy.  Neurological: She is alert and oriented to person, place, and time.  Skin: Skin is warm and dry. No erythema.  Psychiatric: She has a normal mood and affect.    ED Course  Procedures (including critical care time) Labs Review Labs Reviewed - No data to display  Imaging Review No results found.   EKG Interpretation None      MDM   Final diagnoses:  Dental abscess    Prescribed amoxil and hydrocodone.  Pt to f[facility]u with [facility],  Stating she plans to have an upper partial within the next few weeks.   Advised recheck in the interim for any  worsened pain/ swelling, etc.    Burgess Amor, PA-C 01/05/14 1627

## 2014-01-14 NOTE — ED Provider Notes (Signed)
Medical screening examination/treatment/procedure(s) were performed by non-physician practitioner and as supervising physician I was immediately available for consultation/collaboration.   EKG Interpretation None        Delorise Hunkele L Shirleyann Montero, MD 01/14/14 0707 

## 2014-04-22 ENCOUNTER — Encounter (HOSPITAL_COMMUNITY): Payer: Self-pay | Admitting: Emergency Medicine

## 2015-03-18 ENCOUNTER — Other Ambulatory Visit (HOSPITAL_COMMUNITY): Payer: Self-pay | Admitting: *Deleted

## 2015-03-18 DIAGNOSIS — N644 Mastodynia: Secondary | ICD-10-CM

## 2015-03-28 ENCOUNTER — Ambulatory Visit (HOSPITAL_COMMUNITY): Payer: Self-pay

## 2015-03-28 ENCOUNTER — Other Ambulatory Visit: Payer: Self-pay

## 2015-04-17 ENCOUNTER — Ambulatory Visit (HOSPITAL_COMMUNITY): Payer: Self-pay

## 2015-04-18 ENCOUNTER — Other Ambulatory Visit: Payer: Self-pay

## 2015-06-16 ENCOUNTER — Emergency Department (HOSPITAL_COMMUNITY): Payer: Self-pay

## 2015-06-16 ENCOUNTER — Encounter (HOSPITAL_COMMUNITY): Payer: Self-pay | Admitting: *Deleted

## 2015-06-16 ENCOUNTER — Inpatient Hospital Stay (HOSPITAL_COMMUNITY)
Admission: EM | Admit: 2015-06-16 | Discharge: 2015-06-18 | DRG: 190 | Disposition: A | Discharge status: Home / Self Care | Payer: Self-pay | Attending: Family Medicine | Admitting: Family Medicine

## 2015-06-16 DIAGNOSIS — F419 Anxiety disorder, unspecified: Secondary | ICD-10-CM | POA: Diagnosis present

## 2015-06-16 DIAGNOSIS — Z23 Encounter for immunization: Secondary | ICD-10-CM

## 2015-06-16 DIAGNOSIS — F329 Major depressive disorder, single episode, unspecified: Secondary | ICD-10-CM | POA: Diagnosis present

## 2015-06-16 DIAGNOSIS — F17210 Nicotine dependence, cigarettes, uncomplicated: Secondary | ICD-10-CM | POA: Diagnosis present

## 2015-06-16 DIAGNOSIS — I10 Essential (primary) hypertension: Secondary | ICD-10-CM | POA: Diagnosis present

## 2015-06-16 DIAGNOSIS — Z800 Family history of malignant neoplasm of digestive organs: Secondary | ICD-10-CM

## 2015-06-16 DIAGNOSIS — Z803 Family history of malignant neoplasm of breast: Secondary | ICD-10-CM

## 2015-06-16 DIAGNOSIS — J449 Chronic obstructive pulmonary disease, unspecified: Secondary | ICD-10-CM

## 2015-06-16 DIAGNOSIS — N179 Acute kidney failure, unspecified: Secondary | ICD-10-CM | POA: Diagnosis present

## 2015-06-16 DIAGNOSIS — Z791 Long term (current) use of non-steroidal anti-inflammatories (NSAID): Secondary | ICD-10-CM

## 2015-06-16 DIAGNOSIS — J9601 Acute respiratory failure with hypoxia: Secondary | ICD-10-CM | POA: Diagnosis present

## 2015-06-16 DIAGNOSIS — Z833 Family history of diabetes mellitus: Secondary | ICD-10-CM

## 2015-06-16 DIAGNOSIS — E876 Hypokalemia: Secondary | ICD-10-CM | POA: Diagnosis present

## 2015-06-16 DIAGNOSIS — J440 Chronic obstructive pulmonary disease with acute lower respiratory infection: Principal | ICD-10-CM | POA: Diagnosis present

## 2015-06-16 DIAGNOSIS — Z79891 Long term (current) use of opiate analgesic: Secondary | ICD-10-CM

## 2015-06-16 DIAGNOSIS — R918 Other nonspecific abnormal finding of lung field: Secondary | ICD-10-CM

## 2015-06-16 DIAGNOSIS — M1990 Unspecified osteoarthritis, unspecified site: Secondary | ICD-10-CM | POA: Diagnosis present

## 2015-06-16 DIAGNOSIS — K219 Gastro-esophageal reflux disease without esophagitis: Secondary | ICD-10-CM | POA: Diagnosis present

## 2015-06-16 DIAGNOSIS — Z8249 Family history of ischemic heart disease and other diseases of the circulatory system: Secondary | ICD-10-CM

## 2015-06-16 DIAGNOSIS — E860 Dehydration: Secondary | ICD-10-CM | POA: Diagnosis present

## 2015-06-16 DIAGNOSIS — J189 Pneumonia, unspecified organism: Secondary | ICD-10-CM | POA: Diagnosis present

## 2015-06-16 DIAGNOSIS — E871 Hypo-osmolality and hyponatremia: Secondary | ICD-10-CM | POA: Diagnosis present

## 2015-06-16 DIAGNOSIS — D649 Anemia, unspecified: Secondary | ICD-10-CM | POA: Diagnosis present

## 2015-06-16 DIAGNOSIS — J45909 Unspecified asthma, uncomplicated: Secondary | ICD-10-CM | POA: Diagnosis present

## 2015-06-16 DIAGNOSIS — E861 Hypovolemia: Secondary | ICD-10-CM | POA: Diagnosis present

## 2015-06-16 DIAGNOSIS — Z79899 Other long term (current) drug therapy: Secondary | ICD-10-CM

## 2015-06-16 LAB — COMPREHENSIVE METABOLIC PANEL
ALBUMIN: 3.2 g/dL — AB (ref 3.5–5.0)
ALK PHOS: 113 U/L (ref 38–126)
ALT: 15 U/L (ref 14–54)
AST: 16 U/L (ref 15–41)
Anion gap: 12 (ref 5–15)
BILIRUBIN TOTAL: 0.4 mg/dL (ref 0.3–1.2)
BUN: 28 mg/dL — AB (ref 6–20)
CALCIUM: 8.9 mg/dL (ref 8.9–10.3)
CO2: 26 mmol/L (ref 22–32)
Chloride: 91 mmol/L — ABNORMAL LOW (ref 101–111)
Creatinine, Ser: 2.28 mg/dL — ABNORMAL HIGH (ref 0.44–1.00)
GFR calc Af Amer: 27 mL/min — ABNORMAL LOW (ref >60)
GFR, EST NON AFRICAN AMERICAN: 23 mL/min — AB (ref >60)
GLUCOSE: 112 mg/dL — AB (ref 65–99)
POTASSIUM: 3 mmol/L — AB (ref 3.5–5.1)
Sodium: 129 mmol/L — ABNORMAL LOW (ref 135–145)
TOTAL PROTEIN: 7 g/dL (ref 6.5–8.1)

## 2015-06-16 LAB — CBC
HCT: 31.8 % — ABNORMAL LOW (ref 36.0–46.0)
Hemoglobin: 10.9 g/dL — ABNORMAL LOW (ref 12.0–15.0)
MCH: 30.1 pg (ref 26.0–34.0)
MCHC: 34.3 g/dL (ref 30.0–36.0)
MCV: 87.8 fL (ref 78.0–100.0)
PLATELETS: 209 10*3/uL (ref 150–400)
RBC: 3.62 MIL/uL — ABNORMAL LOW (ref 3.87–5.11)
RDW: 14.7 % (ref 11.5–15.5)
WBC: 17 10*3/uL — AB (ref 4.0–10.5)

## 2015-06-16 LAB — LIPASE, BLOOD: Lipase: 15 U/L (ref 11–51)

## 2015-06-16 MED ORDER — ENOXAPARIN SODIUM 30 MG/0.3ML ~~LOC~~ SOLN
30.0000 mg | SUBCUTANEOUS | Status: DC
  Administered 2015-06-16: 30 mg via SUBCUTANEOUS
  Filled 2015-06-16: qty 0.3

## 2015-06-16 MED ORDER — SODIUM CHLORIDE 0.9 % IV SOLN
INTRAVENOUS | Status: DC
  Administered 2015-06-16 – 2015-06-17 (×2): via INTRAVENOUS

## 2015-06-16 MED ORDER — ONDANSETRON HCL 4 MG/2ML IJ SOLN
4.0000 mg | Freq: Four times a day (QID) | INTRAMUSCULAR | Status: DC | PRN
  Administered 2015-06-17 (×2): 4 mg via INTRAVENOUS
  Filled 2015-06-16 (×2): qty 2

## 2015-06-16 MED ORDER — DEXTROSE 5 % IV SOLN
1.0000 g | INTRAVENOUS | Status: DC
  Administered 2015-06-17: 1 g via INTRAVENOUS
  Filled 2015-06-16: qty 10

## 2015-06-16 MED ORDER — SODIUM CHLORIDE 0.9 % IV BOLUS (SEPSIS)
1000.0000 mL | Freq: Once | INTRAVENOUS | Status: AC
  Administered 2015-06-16: 1000 mL via INTRAVENOUS

## 2015-06-16 MED ORDER — HYDROCODONE-ACETAMINOPHEN 5-325 MG PO TABS
1.0000 | ORAL_TABLET | Freq: Four times a day (QID) | ORAL | Status: DC | PRN
  Administered 2015-06-16 – 2015-06-18 (×4): 1 via ORAL
  Filled 2015-06-16 (×5): qty 1

## 2015-06-16 MED ORDER — METHOCARBAMOL 500 MG PO TABS: 1000.0000 mg | ORAL_TABLET | Freq: Three times a day (TID) | ORAL | Status: DC

## 2015-06-16 MED ORDER — FAMOTIDINE 20 MG PO TABS
20.0000 mg | ORAL_TABLET | Freq: Two times a day (BID) | ORAL | Status: DC | PRN
  Administered 2015-06-16 – 2015-06-17 (×2): 20 mg via ORAL
  Filled 2015-06-16 (×2): qty 1

## 2015-06-16 MED ORDER — GABAPENTIN 400 MG PO CAPS
400.0000 mg | ORAL_CAPSULE | Freq: Three times a day (TID) | ORAL | Status: DC
  Administered 2015-06-16 – 2015-06-18 (×6): 400 mg via ORAL
  Filled 2015-06-16 (×6): qty 1

## 2015-06-16 MED ORDER — DEXTROSE 5 % IV SOLN
1.0000 g | Freq: Once | INTRAVENOUS | Status: AC
  Administered 2015-06-16: 1 g via INTRAVENOUS
  Filled 2015-06-16: qty 10

## 2015-06-16 MED ORDER — AZITHROMYCIN 500 MG IV SOLR
500.0000 mg | INTRAVENOUS | Status: DC
  Administered 2015-06-17: 500 mg via INTRAVENOUS
  Filled 2015-06-16: qty 500

## 2015-06-16 MED ORDER — CLONAZEPAM 0.5 MG PO TABS
1.0000 mg | ORAL_TABLET | Freq: Every day | ORAL | Status: DC | PRN
  Administered 2015-06-16: 1 mg via ORAL
  Filled 2015-06-16: qty 2

## 2015-06-16 MED ORDER — ALBUTEROL SULFATE (2.5 MG/3ML) 0.083% IN NEBU
2.5000 mg | INHALATION_SOLUTION | RESPIRATORY_TRACT | Status: DC | PRN
  Administered 2015-06-17: 2.5 mg via RESPIRATORY_TRACT
  Filled 2015-06-16: qty 3

## 2015-06-16 MED ORDER — VORTIOXETINE HBR 10 MG PO TABS: 1.0000 | ORAL_TABLET | Freq: Every day | ORAL | Status: DC

## 2015-06-16 MED ORDER — QUETIAPINE FUMARATE 100 MG PO TABS
400.0000 mg | ORAL_TABLET | Freq: Every day | ORAL | Status: DC
  Administered 2015-06-16 – 2015-06-17 (×2): 400 mg via ORAL
  Filled 2015-06-16 (×2): qty 4

## 2015-06-16 MED ORDER — AZITHROMYCIN 500 MG IV SOLR: 500.0000 mg | INTRAVENOUS | Status: DC

## 2015-06-16 MED ORDER — AZITHROMYCIN 250 MG PO TABS
500.0000 mg | ORAL_TABLET | Freq: Once | ORAL | Status: AC
  Administered 2015-06-16: 500 mg via ORAL
  Filled 2015-06-16: qty 2

## 2015-06-16 NOTE — Progress Notes (Signed)
Notified NP, pt vomiting. Awaiting response.

## 2015-06-16 NOTE — H&P (Signed)
[facility] History and Physical  PARMINDER TRAPANI ZOX:[id number] DOB: 04/11/1961 DOA: 06/16/2015  Referring physician: ER PCP: No PCP Per Patient   Chief Complaint: Cough, dyspnea, poor urine output  HPI: Catherine Butler is a 54 y.o. female  This is a 54 year old lady, smoker, who gives a one-month history of cough productive of green/brown sputum associated with increasing dyspnea. She also tells me that she has not been passing much urine and feels thirsty. She came now to the emergency room because she also had nausea and vomiting today. She has had a fever of 103.5. She denies any chest pain, palpitations or limb weakness. Evaluation in the emergency room shows her to have a right-sided pneumonia with acute renal failure. She is now being admitted for further management.   Review of Systems:  Apart from symptoms above, all systems are negative.  Past Medical History  Diagnosis Date  . Asthma   . Hypertension   . Arthritis   . Reflux   . COPD (chronic obstructive pulmonary disease) (HCC)   . Depression   . Anxiety    Past Surgical History  Procedure Laterality Date  . Laporscopy surgery for ovarian cyst  20 years ago  . Tonsillectomy    . Laparoscopy     Social History:  reports that she has been smoking Cigarettes.  She has a 30 pack-year smoking history. She does not have any smokeless tobacco history on file. She reports that she drinks alcohol. She reports that she does not use illicit drugs.  No Known Allergies  Family History  Problem Relation Age of Onset  . Allergic rhinitis Paternal Grandfather   . Diabetes Father   . Hypertension Father 50    heart attack  . Breast cancer Mother 30    lump removed, bile duct cancer  . Hypertension Brother      Prior to Admission medications   Medication Sig Start Date End Date Taking? Authorizing Provider  albuterol (PROVENTIL HFA;VENTOLIN HFA) 108 (90 BASE) MCG/ACT inhaler Inhale 2 puffs into the lungs every 6 (six)  hours as needed for wheezing.    Historical Provider, MD  amoxicillin (AMOXIL) 875 MG tablet Take 1 tablet (875 mg total) by mouth 2 (two) times daily. 11/03/13   Adrian Blackwater Baker, PA-C  clonazePAM (KLONOPIN) 1 MG tablet Take 1 mg by mouth daily as needed for anxiety.     Historical Provider, MD  diphenhydrAMINE (BENADRYL) 25 MG tablet Take 25 mg by mouth every 6 (six) hours as needed for itching or allergies.    Historical Provider, MD  gabapentin (NEURONTIN) 400 MG capsule Take 400 mg by mouth 3 (three) times daily.    Historical Provider, MD  HYDROcodone-acetaminophen (NORCO) 5-325 MG per tablet Take 1 tablet by mouth every 6 (six) hours as needed for moderate pain. 11/03/13   Graylon Good, PA-C  HYDROcodone-acetaminophen (NORCO/VICODIN) 5-325 MG per tablet Take 1 tablet by mouth every 4 (four) hours as needed. 01/04/14   Burgess Amor, PA-C  ibuprofen (ADVIL,MOTRIN) 800 MG tablet Take 1 tablet (800 mg total) by mouth every 8 (eight) hours as needed. 06/11/13   Ivery Quale, PA-C  ibuprofen (ADVIL,MOTRIN) 800 MG tablet Take 1 tablet (800 mg total) by mouth 3 (three) times daily. 11/03/13   Graylon Good, PA-C  Melatonin 3 MG CAPS Take 1 capsule by mouth at bedtime.    Historical Provider, MD  methocarbamol (ROBAXIN) 500 MG tablet Take 2 tablets (1,000 mg total) by mouth 3 (three)  times daily. 06/11/13   Ivery Quale, PA-C  oxyCODONE (ROXICODONE) 5 MG immediate release tablet Take 1 tablet (5 mg total) by mouth every 6 (six) hours as needed for severe pain. 10/06/13   Riki Sheer, PA-C  penicillin v potassium (VEETID) 250 MG tablet Take 1 tablet (250 mg total) by mouth 4 (four) times daily. 02/09/13   Samuel Jester, DO  QUEtiapine (SEROQUEL) 400 MG tablet Take 400 mg by mouth at bedtime.    Historical Provider, MD  traMADol (ULTRAM) 50 MG tablet Take 1 tablet (50 mg total) by mouth every 6 (six) hours as needed. 05/07/13   Tammy Triplett, PA-C  Vortioxetine HBr (BRINTELLIX) 10 MG TABS Take 1  tablet by mouth daily.    Historical Provider, MD   Physical Exam: Filed Vitals:   06/16/15 1548 06/16/15 1828  BP: 93/56 120/76  Pulse: 95 65  Temp: 98 F (36.7 C) 98 F (36.7 C)  TempSrc: Oral Oral  Resp: 18 14  Height:  (1.676 m)   Weight: 83.915 kg (185 lb)   SpO2: 100% 100%    Wt Readings from Last 3 Encounters:  06/16/15 83.915 kg (185 lb)  01/04/14 86.183 kg (190 lb)  06/18/13 90.719 kg (200 lb)    General:  Appears clinically dehydrated. She does not appear toxic or septic clinically. Eyes: PERRL, normal lids, irises & conjunctiva ENT: grossly normal hearing, lips & tongue Neck: no LAD, masses or thyromegaly Cardiovascular: RRR, no m/r/g. No LE edema. Telemetry: SR, no arrhythmias  Respiratory: Occasional scattered wheezes with some crackles in the right mid zone. There is no bronchial breathing. Abdomen: soft, ntnd Skin: no rash or induration seen on limited exam Musculoskeletal: grossly normal tone BUE/BLE Psychiatric: grossly normal mood and affect, speech fluent and appropriate Neurologic: grossly non-focal.          Labs on Admission:  Basic Metabolic Panel:  Recent Labs Lab 06/16/15 1613  NA 129*  K 3.0*  CL 91*  CO2 26  GLUCOSE 112*  BUN 28*  CREATININE 2.28*  CALCIUM 8.9   Liver Function Tests:  Recent Labs Lab 06/16/15 1613  AST 16  ALT 15  ALKPHOS 113  BILITOT 0.4  PROT 7.0  ALBUMIN 3.2*    Recent Labs Lab 06/16/15 1613  LIPASE 15   No results for input(s): AMMONIA in the last 168 hours. CBC:  Recent Labs Lab 06/16/15 1613  WBC 17.0*  HGB 10.9*  HCT 31.8*  MCV 87.8  PLT 209   Cardiac Enzymes: No results for input(s): CKTOTAL, CKMB, CKMBINDEX, TROPONINI in the last 168 hours.  BNP (last 3 results) No results for input(s): BNP in the last 8760 hours.  ProBNP (last 3 results) No results for input(s): PROBNP in the last 8760 hours.  CBG: No results for input(s): GLUCAP in the last 168  hours.  Radiological Exams on Admission: Dg Chest 2 View  06/16/2015  CLINICAL DATA:  Productive cough for 1 month PE.  Fever. EXAM: CHEST  2 VIEW COMPARISON:  Radiograph 10/06/2013 FINDINGS: Normal cardiac silhouette. There is a 5.5 cm rounded mass density in the RIGHT lower lobe which is new from prior. No pleural fluid.  No osseous abnormality.  No adenopathy evident. IMPRESSION: Rounded mass within the RIGHT lower lobe with differential including malignancy versus rounded pneumonia. Favor rounded pneumonia. Recommend CT thorax with contrast for evaluation. Electronically Signed   By: Genevive Bi M.D.   On: 06/16/2015 16:37      Assessment/Plan  1. Community-acquired pneumonia, right lower lobe. The differential based on appearance on checks her x-ray is also malignancy. She will be treated with intravenous antibiotics. When her renal function is improved, a CT scan with contrast should be done. 2. Acute renal failure. This is secondary to hypovolemia/dehydration. She will be given IV fluids fairly aggressively and renal function will be monitored closely. 3. COPD. This appears to be stable. 4. Tobacco abuse. Long-standing.  She will be admitted to the medical floor. Further recommendations will depend on patient's hospital progress.  Code Status: Full code.  DVT Prophylaxis: Lovenox.  Family Communication: I discussed the plan with the patient at the bedside.   Disposition Plan: Home when medically stable.   Time spent: 60 minutes.  Wilson SingerGOSRANI,Mandeep Kiser C [facility] Pager [phone].

## 2015-06-16 NOTE — Progress Notes (Signed)
Notified MD, pt complaining of heartburn. Awaiting response

## 2015-06-16 NOTE — ED Notes (Signed)
Pt comes in with cough and emesis lasting 3 days. Pt denies any diarrhea. Pt verbalizes she has had fevers, no fever upon triage.

## 2015-06-16 NOTE — ED Provider Notes (Signed)
CSN: [id number]     Arrival date & time 06/16/15  1518 History   First MD Initiated Contact with Patient 06/16/15 1818     Chief Complaint  Patient presents with  . Cough  . Emesis     Patient is a 54 y.o. female presenting with cough and vomiting. The history is provided by the patient.  Cough Cough characteristics:  Productive Sputum characteristics:  Green Severity:  Moderate Onset quality:  Gradual Duration:  3 days Timing:  Intermittent Progression:  Worsening Chronicity:  New Smoker: yes   Relieved by:  Nothing Worsened by:  Nothing tried Associated symptoms: fever   Associated symptoms: no chest pain   Risk factors: no recent travel   Emesis pt reports she has had intermittent cough for one month (she is a smoker) Over past 3 days her cough has worsened with fever up to 103.5 and also vomiting No CP is reported No significant SOB is reported   Past Medical History  Diagnosis Date  . Asthma   . Hypertension   . Arthritis   . Reflux   . COPD (chronic obstructive pulmonary disease) (HCC)   . Depression   . Anxiety    Past Surgical History  Procedure Laterality Date  . Laporscopy surgery for ovarian cyst  20 years ago  . Tonsillectomy    . Laparoscopy     Family History  Problem Relation Age of Onset  . Allergic rhinitis Paternal Grandfather   . Diabetes Father   . Hypertension Father 50    heart attack  . Breast cancer Mother 30    lump removed, bile duct cancer  . Hypertension Brother    Social History  Substance Use Topics  . Smoking status: Current Every Day Smoker -- 1.00 packs/day for 30 years    Types: Cigarettes  . Smokeless tobacco: None     Comment: states "might be interested in the smoking cessation program"  Pt given counseling related to smoking cessation  . Alcohol Use: Yes     Comment: couple x a week   OB History    Gravida Para Term Preterm AB TAB SAB Ectopic Multiple Living   Review of Systems   Constitutional: Positive for fever.  Respiratory: Positive for cough.   Cardiovascular: Negative for chest pain.  Gastrointestinal: Positive for vomiting.  Genitourinary:       Decreased urine output   All other systems reviewed and are negative.     Allergies  Review of patient's allergies indicates no known allergies.  Home Medications   Prior to Admission medications   Medication Sig Start Date End Date Taking? Authorizing Provider  albuterol (PROVENTIL HFA;VENTOLIN HFA) 108 (90 BASE) MCG/ACT inhaler Inhale 2 puffs into the lungs every 6 (six) hours as needed for wheezing.    Historical Provider, MD  amoxicillin (AMOXIL) 875 MG tablet Take 1 tablet (875 mg total) by mouth 2 (two) times daily. 11/03/13   Adrian Blackwater Baker, PA-C  clonazePAM (KLONOPIN) 1 MG tablet Take 1 mg by mouth daily as needed for anxiety.     Historical Provider, MD  diphenhydrAMINE (BENADRYL) 25 MG tablet Take 25 mg by mouth every 6 (six) hours as needed for itching or allergies.    Historical Provider, MD  gabapentin (NEURONTIN) 400 MG capsule Take 400 mg by mouth 3 (three) times daily.    Historical Provider, MD  HYDROcodone-acetaminophen (NORCO) 5-325 MG per tablet  Take 1 tablet by mouth every 6 (six) hours as needed for moderate pain. 11/03/13   Graylon GoodZachary H Baker, PA-C  HYDROcodone-acetaminophen (NORCO/VICODIN) 5-325 MG per tablet Take 1 tablet by mouth every 4 (four) hours as needed. 01/04/14   Burgess AmorJulie Idol, PA-C  ibuprofen (ADVIL,MOTRIN) 800 MG tablet Take 1 tablet (800 mg total) by mouth every 8 (eight) hours as needed. 06/11/13   Ivery QualeHobson Bryant, PA-C  ibuprofen (ADVIL,MOTRIN) 800 MG tablet Take 1 tablet (800 mg total) by mouth 3 (three) times daily. 11/03/13   Graylon GoodZachary H Baker, PA-C  Melatonin 3 MG CAPS Take 1 capsule by mouth at bedtime.    Historical Provider, MD  methocarbamol (ROBAXIN) 500 MG tablet Take 2 tablets (1,000 mg total) by mouth 3 (three) times daily. 06/11/13   Ivery QualeHobson Bryant, PA-C  oxyCODONE  (ROXICODONE) 5 MG immediate release tablet Take 1 tablet (5 mg total) by mouth every 6 (six) hours as needed for severe pain. 10/06/13   Riki SheerMichelle G Young, PA-C  penicillin v potassium (VEETID) 250 MG tablet Take 1 tablet (250 mg total) by mouth 4 (four) times daily. 02/09/13   Samuel JesterKathleen McManus, DO  QUEtiapine (SEROQUEL) 400 MG tablet Take 400 mg by mouth at bedtime.    Historical Provider, MD  traMADol (ULTRAM) 50 MG tablet Take 1 tablet (50 mg total) by mouth every 6 (six) hours as needed. 05/07/13   Tammy Triplett, PA-C  Vortioxetine HBr (BRINTELLIX) 10 MG TABS Take 1 tablet by mouth daily.    Historical Provider, MD   BP 120/76 mmHg  Pulse 65  Temp(Src) 98 F (36.7 C) (Oral)  Resp 14  Ht 5\' 6"  (1.676 m)  Wt 83.915 kg  BMI 29.87 kg/m2  SpO2 100%  LMP 02/22/2006 Physical Exam CONSTITUTIONAL: Well developed/well nourished HEAD: Normocephalic/atraumatic EYES: EOMI ENMT: Mucous membranes moist NECK: supple no meningeal signs SPINE/BACK:entire spine nontender CV: S1/S2 noted, no murmurs/rubs/gallops noted LUNGS: crackles in right base no apparent distress ABDOMEN: soft, nontender GU:no cva tenderness NEURO: Pt is awake/alert/appropriate, moves all extremitiesx4.  No facial droop.   EXTREMITIES: pulses normal/equal, full ROM SKIN: warm, color normal PSYCH: no abnormalities of mood noted, alert and oriented to situation  ED Course  Procedures  7:05 PM Pt with CAP ?mass on cxr, but will need repeat imaging once antibiotics are complete and I suspect this is all due to infection She is also in acute renal failure Will need admission for IV fluids/antibiotics D/w dr Karilyn Cotagosrani will admit Pt agreeable  Labs Review Labs Reviewed  COMPREHENSIVE METABOLIC PANEL - Abnormal; Notable for the following:    Sodium 129 (*)    Potassium 3.0 (*)    Chloride 91 (*)    Glucose, Bld 112 (*)    BUN 28 (*)    Creatinine, Ser 2.28 (*)    Albumin 3.2 (*)    GFR calc non Af Amer 23 (*)    GFR  calc Af Amer 27 (*)    All other components within normal limits  CBC - Abnormal; Notable for the following:    WBC 17.0 (*)    RBC 3.62 (*)    Hemoglobin 10.9 (*)    HCT 31.8 (*)    All other components within normal limits  LIPASE, BLOOD  URINALYSIS, ROUTINE W REFLEX MICROSCOPIC (NOT [facility])    Imaging Review Dg Chest 2 View  06/16/2015  CLINICAL DATA:  Productive cough for 1 month PE.  Fever. EXAM: CHEST  2 VIEW COMPARISON:  Radiograph 10/06/2013 FINDINGS:  Normal cardiac silhouette. There is a 5.5 cm rounded mass density in the RIGHT lower lobe which is new from prior. No pleural fluid.  No osseous abnormality.  No adenopathy evident. IMPRESSION: Rounded mass within the RIGHT lower lobe with differential including malignancy versus rounded pneumonia. Favor rounded pneumonia. Recommend CT thorax with contrast for evaluation. Electronically Signed   By: Genevive Bi M.D.   On: 06/16/2015 16:37   I have personally reviewed and evaluated these images and lab results as part of my medical decision-making.  Medications  sodium chloride 0.9 % bolus 1,000 mL (not administered)  cefTRIAXone (ROCEPHIN) 1 g in dextrose 5 % 50 mL IVPB (not administered)  azithromycin (ZITHROMAX) tablet 500 mg (500 mg Oral Given 06/16/15 1902)     MDM   Final diagnoses:  CAP (community acquired pneumonia)  AKI (acute kidney injury) [facility])    Nursing notes including past medical history and social history reviewed and considered in documentation Labs/vital reviewed myself and considered during evaluation xrays/imaging reviewed by myself and considered during evaluation     Zadie Rhine, MD 06/16/15 1906

## 2015-06-16 NOTE — Progress Notes (Signed)
Vortioxitene pt states she does not take this medication anymore for depression.

## 2015-06-17 DIAGNOSIS — J9601 Acute respiratory failure with hypoxia: Secondary | ICD-10-CM

## 2015-06-17 LAB — CBC
HEMATOCRIT: 35.7 % — AB (ref 36.0–46.0)
Hemoglobin: 11.9 g/dL — ABNORMAL LOW (ref 12.0–15.0)
MCH: 29.5 pg (ref 26.0–34.0)
MCHC: 33.3 g/dL (ref 30.0–36.0)
MCV: 88.4 fL (ref 78.0–100.0)
Platelets: 223 10*3/uL (ref 150–400)
RBC: 4.04 MIL/uL (ref 3.87–5.11)
RDW: 14.8 % (ref 11.5–15.5)
WBC: 10.1 10*3/uL (ref 4.0–10.5)

## 2015-06-17 LAB — COMPREHENSIVE METABOLIC PANEL
ALBUMIN: 3.5 g/dL (ref 3.5–5.0)
ALT: 14 U/L (ref 14–54)
ANION GAP: 14 (ref 5–15)
AST: 17 U/L (ref 15–41)
Alkaline Phosphatase: 125 U/L (ref 38–126)
BUN: 20 mg/dL (ref 6–20)
CHLORIDE: 95 mmol/L — AB (ref 101–111)
CO2: 27 mmol/L (ref 22–32)
Calcium: 8.9 mg/dL (ref 8.9–10.3)
Creatinine, Ser: 1.36 mg/dL — ABNORMAL HIGH (ref 0.44–1.00)
GFR calc non Af Amer: 44 mL/min — ABNORMAL LOW (ref >60)
GFR, EST AFRICAN AMERICAN: 51 mL/min — AB (ref >60)
GLUCOSE: 105 mg/dL — AB (ref 65–99)
Potassium: 2.9 mmol/L — ABNORMAL LOW (ref 3.5–5.1)
SODIUM: 136 mmol/L (ref 135–145)
Total Bilirubin: 0.5 mg/dL (ref 0.3–1.2)
Total Protein: 7.8 g/dL (ref 6.5–8.1)

## 2015-06-17 LAB — STREP PNEUMONIAE URINARY ANTIGEN: Strep Pneumo Urinary Antigen: NEGATIVE

## 2015-06-17 MED ORDER — IPRATROPIUM-ALBUTEROL 0.5-2.5 (3) MG/3ML IN SOLN
3.0000 mL | Freq: Four times a day (QID) | RESPIRATORY_TRACT | Status: DC
  Administered 2015-06-17 – 2015-06-18 (×4): 3 mL via RESPIRATORY_TRACT
  Filled 2015-06-17 (×4): qty 3

## 2015-06-17 MED ORDER — PNEUMOCOCCAL VAC POLYVALENT 25 MCG/0.5ML IJ INJ
0.5000 mL | INJECTION | INTRAMUSCULAR | Status: AC
  Administered 2015-06-18: 0.5 mL via INTRAMUSCULAR
  Filled 2015-06-17: qty 0.5

## 2015-06-17 MED ORDER — ENOXAPARIN SODIUM 40 MG/0.4ML ~~LOC~~ SOLN
40.0000 mg | SUBCUTANEOUS | Status: DC
  Administered 2015-06-17: 40 mg via SUBCUTANEOUS
  Filled 2015-06-17: qty 0.4

## 2015-06-17 MED ORDER — POTASSIUM CHLORIDE CRYS ER 20 MEQ PO TBCR
40.0000 meq | EXTENDED_RELEASE_TABLET | ORAL | Status: AC
  Administered 2015-06-17 (×3): 40 meq via ORAL
  Filled 2015-06-17 (×3): qty 2

## 2015-06-17 MED ORDER — GUAIFENESIN ER 600 MG PO TB12
1200.0000 mg | ORAL_TABLET | Freq: Two times a day (BID) | ORAL | Status: DC
  Administered 2015-06-17 – 2015-06-18 (×3): 1200 mg via ORAL
  Filled 2015-06-17 (×3): qty 2

## 2015-06-17 MED ORDER — INFLUENZA VAC SPLIT QUAD 0.5 ML IM SUSY
0.5000 mL | PREFILLED_SYRINGE | INTRAMUSCULAR | Status: AC
  Administered 2015-06-18: 0.5 mL via INTRAMUSCULAR
  Filled 2015-06-17: qty 0.5

## 2015-06-17 NOTE — Care Management Note (Signed)
Case Management Note  Patient Details  Name: Patriciaann ClanKelly D Heigl MRN: [id number] Date of Birth: 11/07/1960  Subjective/Objective:                  Pt is from home, lives with her parents for whom she cares for. Pt is ind with ADL's. Pt has no insurance and no PCP. Pt has chosen [facility] for f/u care.   Action/Plan: Pt may need MATCH voucher at [place]. Pt will need eligibility appointment made at the [facility] clinic at [place]. [facility]. Will cont to follow.   Expected Discharge Date:     06/18/2015             Expected Discharge Plan:  Home/Self Care  In-House Referral:  Financial Counselor  Discharge planning Services  CM Consult, Follow-up appt scheduled[facility]  Post Acute Care Choice:  NA Choice offered to:  NA  DME Arranged:    DME Agency:     HH Arranged:    HH Agency:     Status of Service:  In process, will continue to follow  Medicare Important Message Given:    Date Medicare IM Given:    Medicare IM give by:    Date Additional Medicare IM Given:    Additional Medicare Important Message give by:     If discussed at Long Length of Stay Meetings, dates discussed:    Additional Comments:  Malcolm MetroChildress, Millisa Giarrusso Demske, RN 06/17/2015, 1:52 PM

## 2015-06-17 NOTE — Progress Notes (Signed)
[facility] PROGRESS NOTE  Catherine ClanKelly D Butler ZOX:[id number] DOB: 01/11/1961 DOA: 06/16/2015 PCP: No PCP Per Patient Summary [age] yow with a history of smoking presented with complaints of persistent productive cough and associated SOB, nausea, vomiting, and fever.  Admitted for right-sided pneumonia and acute renal failure. CXR could not rule out underlying malignancy she will need CT scan of chest with contrast when her renal function improves. She has been started on broad spectrum abx. Anticipate discharge within 1-2 days.   Assessment/Plan: 1. CAP, Right-lower lobe. Started on broad spectrum abx. CXR could not rule out underlying malignancy she will need CT scan of chest with contrast when her renal function improves. WBC WNL. Currently afebrile.  2. Acute respiratory failure, not chronically on O2. Will try to wean off oxygen. Patient reports chronic SOB and uses her mother's inhaler with mild relief.  3. Dehydration, resolved with IVF.  4. Acute renal failure, secondary to hypovolemia/dehydration. Is improving with aggressive IVF. Will continue to rehydrate and monitor.   5. Hypokalemia, will replete.  6. COPD, stable.  7. Tobacco abuse. Long standing.   Code Status: Full DVT prophylaxis: Lovenox Family Communication: Discussed with patient who understands and has no concerns at this time. Disposition Plan: Anticipate discharge in 1-2 days.   Consultants:    Procedures:    Antibiotics:  Azithromycin 12/27>>  Ceftriaxone 12/27>>  HPI/Subjective: Feeling a little better. Still has mild productive cough. Vomiting and nausea resolved. SOB is improving.   Objective: Filed Vitals:   06/17/15 0609 06/17/15 0726  BP: 126/74   Pulse: 105 111  Temp: 99.1 F (37.3 C)   Resp: 20 1    Intake/Output Summary (Last 24 hours) at 06/17/15 0752 Last data filed at 06/17/15 0500  Gross per 24 hour  Intake  997.5 ml  Output    400 ml  Net  597.5 ml   Filed Weights   06/16/15  1548 06/16/15 2047  Weight: 83.915 kg (185 lb) 92.625 kg (204 lb 3.2 oz)    Exam: General: NAD, looks comfortable Cardiovascular: Tachycardiac  Respiratory: clear bilaterally, No wheezing, rales or rhonchi Abdomen: soft, non tender, no distention , bowel sounds normal Musculoskeletal: No edema b/l  Data Reviewed: Basic Metabolic Panel:  Recent Labs Lab 06/16/15 1613  NA 129*  K 3.0*  CL 91*  CO2 26  GLUCOSE 112*  BUN 28*  CREATININE 2.28*  CALCIUM 8.9   Liver Function Tests:  Recent Labs Lab 06/16/15 1613  AST 16  ALT 15  ALKPHOS 113  BILITOT 0.4  PROT 7.0  ALBUMIN 3.2*    Recent Labs Lab 06/16/15 1613  LIPASE 15   CBC:  Recent Labs Lab 06/16/15 1613 06/17/15 0714  WBC 17.0* 10.1  HGB 10.9* 11.9*  HCT 31.8* 35.7*  MCV 87.8 88.4  PLT 209 223    Studies: Dg Chest 2 View  06/16/2015  CLINICAL DATA:  Productive cough for 1 month PE.  Fever. EXAM: CHEST  2 VIEW COMPARISON:  Radiograph 10/06/2013 FINDINGS: Normal cardiac silhouette. There is a 5.5 cm rounded mass density in the RIGHT lower lobe which is new from prior. No pleural fluid.  No osseous abnormality.  No adenopathy evident. IMPRESSION: Rounded mass within the RIGHT lower lobe with differential including malignancy versus rounded pneumonia. Favor rounded pneumonia. Recommend CT thorax with contrast for evaluation. Electronically Signed   By: Genevive BiStewart  Edmunds M.D.   On: 06/16/2015 16:37    Scheduled Meds: . azithromycin  500 mg Intravenous Q24H  .  cefTRIAXone (ROCEPHIN)  IV  1 g Intravenous Q24H  . enoxaparin (LOVENOX) injection  30 mg Subcutaneous Q24H  . gabapentin  400 mg Oral TID  . [START ON 06/18/2015] Influenza vac split quadrivalent PF  0.5 mL Intramuscular Tomorrow-1000  . [START ON 06/18/2015] pneumococcal 23 valent vaccine  0.5 mL Intramuscular Tomorrow-1000  . QUEtiapine  400 mg Oral QHS  . Vortioxetine HBr  1 tablet Oral Daily   Continuous Infusions: . sodium chloride 150  mL/hr at 06/16/15 2221    Active Problems:   CAP (community acquired pneumonia)   Acute renal failure (ARF) (HCC)   COPD (chronic obstructive pulmonary disease) (HCC)   Community acquired pneumonia    Time spent: 25 minutes     Erick Blinks, MD  [facility] Pager [phone]. If 7PM-7AM, please contact night-coverage at [email or web address], password [facility] 06/17/2015, 7:52 AM  LOS: 1 day     By signing my name below, I, Zadie Cleverly, attest that this documentation has been prepared under the direction and in the presence of Erick Blinks, MD. Electronically signed: Zadie Cleverly, Scribe. 06/17/2015 10:22am  I, Dr. Erick Blinks, personally performed the services described in this documentaiton. All medical record entries made by the scribe were at my direction and in my presence. I have reviewed the chart and agree that the record reflects my personal performance and is accurate and complete  Erick Blinks, MD, 06/17/2015 10:31 AM

## 2015-06-17 NOTE — Clinical Documentation Improvement (Signed)
Internal Medicine  Abnormal Lab/Test Results:  06/16/15: Na= 129  Possible Clinical Conditions associated with below indicators  Hyponatremia  Other Condition  Cannot Clinically Determine   Treatment Provided: NS 150 cc/hr 06/17/15: Na= 136   Please exercise your independent, professional judgment when responding. A specific answer is not anticipated or expected.   Thank You,  Cherylann Ratelonna J Onedia Vargus, RN, BSN [facility] [phone]

## 2015-06-18 ENCOUNTER — Inpatient Hospital Stay (HOSPITAL_COMMUNITY): Payer: Self-pay

## 2015-06-18 DIAGNOSIS — N179 Acute kidney failure, unspecified: Secondary | ICD-10-CM

## 2015-06-18 DIAGNOSIS — J189 Pneumonia, unspecified organism: Secondary | ICD-10-CM

## 2015-06-18 DIAGNOSIS — J449 Chronic obstructive pulmonary disease, unspecified: Secondary | ICD-10-CM

## 2015-06-18 LAB — BASIC METABOLIC PANEL
Anion gap: 6 (ref 5–15)
BUN: 8 mg/dL (ref 6–20)
CALCIUM: 8.2 mg/dL — AB (ref 8.9–10.3)
CO2: 24 mmol/L (ref 22–32)
CREATININE: 0.89 mg/dL (ref 0.44–1.00)
Chloride: 107 mmol/L (ref 101–111)
GFR calc Af Amer: >60 mL/min (ref >60)
Glucose, Bld: 91 mg/dL (ref 65–99)
Potassium: 3.7 mmol/L (ref 3.5–5.1)
SODIUM: 137 mmol/L (ref 135–145)

## 2015-06-18 LAB — CBC
HCT: 28.4 % — ABNORMAL LOW (ref 36.0–46.0)
Hemoglobin: 9.4 g/dL — ABNORMAL LOW (ref 12.0–15.0)
MCH: 29.8 pg (ref 26.0–34.0)
MCHC: 33.1 g/dL (ref 30.0–36.0)
MCV: 90.2 fL (ref 78.0–100.0)
PLATELETS: 240 10*3/uL (ref 150–400)
RBC: 3.15 MIL/uL — ABNORMAL LOW (ref 3.87–5.11)
RDW: 15 % (ref 11.5–15.5)
WBC: 5.8 10*3/uL (ref 4.0–10.5)

## 2015-06-18 LAB — LEGIONELLA ANTIGEN, URINE

## 2015-06-18 LAB — MAGNESIUM: MAGNESIUM: 1.8 mg/dL (ref 1.7–2.4)

## 2015-06-18 LAB — HIV ANTIBODY (ROUTINE TESTING W REFLEX): HIV Screen 4th Generation wRfx: NONREACTIVE

## 2015-06-18 MED ORDER — CEFUROXIME AXETIL 500 MG PO TABS: 500.0000 mg | ORAL_TABLET | Freq: Two times a day (BID) | ORAL | Status: DC

## 2015-06-18 MED ORDER — SODIUM CHLORIDE 0.9 % IJ SOLN: 3.0000 mL | INTRAMUSCULAR | Status: DC | PRN

## 2015-06-18 MED ORDER — IOHEXOL 300 MG/ML  SOLN
60.0000 mL | Freq: Once | INTRAMUSCULAR | Status: AC | PRN
  Administered 2015-06-18: 60 mL via INTRAVENOUS

## 2015-06-18 MED ORDER — SODIUM CHLORIDE 0.9 % IV SOLN: 250.0000 mL | INTRAVENOUS | Status: DC | PRN

## 2015-06-18 MED ORDER — SODIUM CHLORIDE 0.9 % IV SOLN
INTRAVENOUS | Status: DC
  Administered 2015-06-18: 15:00:00 via INTRAVENOUS

## 2015-06-18 MED ORDER — AZITHROMYCIN 500 MG PO TABS: 500.0000 mg | ORAL_TABLET | Freq: Every day | ORAL | Status: DC

## 2015-06-18 MED ORDER — ALBUTEROL SULFATE HFA 108 (90 BASE) MCG/ACT IN AERS: 2.0000 | INHALATION_SPRAY | Freq: Four times a day (QID) | RESPIRATORY_TRACT | Status: DC | PRN

## 2015-06-18 MED ORDER — CEFUROXIME AXETIL 250 MG PO TABS
500.0000 mg | ORAL_TABLET | Freq: Two times a day (BID) | ORAL | Status: DC
  Administered 2015-06-18: 500 mg via ORAL
  Filled 2015-06-18: qty 2

## 2015-06-18 MED ORDER — SODIUM CHLORIDE 0.9 % IJ SOLN
3.0000 mL | Freq: Two times a day (BID) | INTRAMUSCULAR | Status: DC
  Administered 2015-06-18: 3 mL via INTRAVENOUS

## 2015-06-18 MED ORDER — AZITHROMYCIN 250 MG PO TABS: 500.0000 mg | ORAL_TABLET | Freq: Every day | ORAL | Status: DC

## 2015-06-18 NOTE — Discharge Summary (Signed)
Physician Discharge Summary  Catherine Butler ZOX:[id number] DOB: 1961-01-12 DOA: 06/16/2015  PCP: No PCP Per Patient  [facility] for f/u care  Admit date: 06/16/2015 Discharge date: 06/18/2015  Recommendations for Outpatient Follow-up:  1. Follow up CXR in 3-4 weeks for resolution of PNA. 2. Follow up anemia  Follow-up Information    Follow up with [facility] On 06/27/2015.   Why:  @ 1030am for eligibility appointment. Bring completed application and requested documents   Contact information:   [address] [place] [phone]       Discharge Diagnoses:  1. CAP, right-lower lobe  2. Acute hypoxic respiratory failure  3. AKI, secondary to hypovolemia/dehydration 4. Hypokalemia  5. Normocytic anemia  6. Hyponatremia 7. COPD, asthma .  8. Tobacco use disorder 9. Depression, anxiety  Discharge Condition: Improved Disposition: Home  Diet recommendation: Heart healthy  Filed Weights   06/16/15 1548 06/16/15 2047  Weight: 83.915 kg (185 lb) 92.625 kg (204 lb 3.2 oz)    History of present illness:  6 yow smoker presented with productive cough and associated SOB, nausea, vomiting, and fever.   Hospital Course:  Patient presented with SOB and was found to have a right lower lobe CAP. She was started on supplemental O2 and broad spectrum IV abx with significant improvement. She is afebrile with a normal WBC count. CXR could not rule out underlying malignancy therefore a CTA scan of chest was ordered. It revealed pneumonia. Acute hypoxic respiratory failure resolved with supplemental O2. Patient was weaned off oxygen and remained stable on RA. AKI and hyponatremia, likely due to dehydration, resolved with IVF. Hypokalemia was repleted.    Individual issues as below:  1. CAP, right-lower lobe. Appears clinically resolved. CT scan reassuring WBC WNL. Currently afebrile.  2. Acute hypoxic respiratory failure,  resolved. Stable on RA.  3. AKI, secondary to hypovolemia/dehydration. Dehydration, resolved with IVF.  4. Hypokalemia, repleted. 5. Normocytic anemia. Stable, a little lower today from IVF/rehydration. Follow-up as outpatient. 6. Hyponatremia, likely dehydration (also with low chloride). Resolved.  7. COPD, asthma. Stable.  8. Tobacco use disorder. Encourage cessation. 9. Depression, anxiety  Consultants:  none  Procedures:  none  Antibiotics:  Azithromycin 12/26>> 12/30  Ceftriaxone 12/26>> 12/27  Ceftin 12/28 >> 1/1  Discharge Instructions Discharge Instructions    Diet general    Complete by:  As directed      Discharge instructions    Complete by:  As directed   Call your physician or seek immediate medical attention for shortness of breath, fever or worsening of condition.     Increase activity slowly    Complete by:  As directed             Current Discharge Medication List    START taking these medications   Details  azithromycin (ZITHROMAX) 500 MG tablet Take 1 tablet (500 mg total) by mouth at bedtime. Qty: 3 tablet, Refills: 0    cefUROXime (CEFTIN) 500 MG tablet Take 1 tablet (500 mg total) by mouth 2 (two) times daily with a meal. Qty: 10 tablet, Refills: 0      CONTINUE these medications which have CHANGED   Details  albuterol (PROVENTIL HFA;VENTOLIN HFA) 108 (90 Base) MCG/ACT inhaler Inhale 2 puffs into the lungs every 6 (six) hours as needed for wheezing. Qty: 1 Inhaler, Refills: 0      CONTINUE these medications which have NOT CHANGED   Details  gabapentin (NEURONTIN) 400  MG capsule Take 400 mg by mouth 3 (three) times daily.    QUEtiapine (SEROQUEL) 400 MG tablet Take 400 mg by mouth at bedtime.      STOP taking these medications     clonazePAM (KLONOPIN) 1 MG tablet      HYDROcodone-acetaminophen (NORCO) 5-325 MG per tablet        No Known Allergies  The results of significant diagnostics from this hospitalization  (including imaging, microbiology, ancillary and laboratory) are listed below for reference.    Significant Diagnostic Studies: Dg Chest 2 View  06/16/2015  CLINICAL DATA:  Productive cough for 1 month PE.  Fever. EXAM: CHEST  2 VIEW COMPARISON:  Radiograph 10/06/2013 FINDINGS: Normal cardiac silhouette. There is a 5.5 cm rounded mass density in the RIGHT lower lobe which is new from prior. No pleural fluid.  No osseous abnormality.  No adenopathy evident. IMPRESSION: Rounded mass within the RIGHT lower lobe with differential including malignancy versus rounded pneumonia. Favor rounded pneumonia. Recommend CT thorax with contrast for evaluation. Electronically Signed   By: Genevive BiStewart  Edmunds M.D.   On: 06/16/2015 16:37    Microbiology: Recent Results (from the past 240 hour(s))  Culture, blood (routine x 2) Call MD if unable to obtain prior to antibiotics being given     Status: None (Preliminary result)   Collection Time: 06/16/15  7:58 PM  Result Value Ref Range Status   Specimen Description BLOOD LEFT HAND  Final   Special Requests   Final    BOTTLES DRAWN AEROBIC AND ANAEROBIC AEB 10CC ANA 8CC   Culture NO GROWTH 2 DAYS  Final   Report Status PENDING  Incomplete  Culture, blood (routine x 2) Call MD if unable to obtain prior to antibiotics being given     Status: None (Preliminary result)   Collection Time: 06/16/15  8:05 PM  Result Value Ref Range Status   Specimen Description BLOOD LEFT ARM  Final   Special Requests BOTTLES DRAWN AEROBIC AND ANAEROBIC 8CC EACH  Final   Culture NO GROWTH 2 DAYS  Final   Report Status PENDING  Incomplete     Labs: Basic Metabolic Panel:  Recent Labs Lab 06/16/15 1613 06/17/15 0714 06/18/15 0547  NA 129* 136 137  K 3.0* 2.9* 3.7  CL 91* 95* 107  CO2 26 27 24   GLUCOSE 112* 105* 91  BUN 28* 20 8  CREATININE 2.28* 1.36* 0.89  CALCIUM 8.9 8.9 8.2*  MG  --   --  1.8   Liver Function Tests:  Recent Labs Lab 06/16/15 1613 06/17/15 0714    AST 16 17  ALT 15 14  ALKPHOS 113 125  BILITOT 0.4 0.5  PROT 7.0 7.8  ALBUMIN 3.2* 3.5    Recent Labs Lab 06/16/15 1613  LIPASE 15   CBC:  Recent Labs Lab 06/16/15 1613 06/17/15 0714 06/18/15 0547  WBC 17.0* 10.1 5.8  HGB 10.9* 11.9* 9.4*  HCT 31.8* 35.7* 28.4*  MCV 87.8 88.4 90.2  PLT 209 223 240     Principal Problem:   CAP (community acquired pneumonia) Active Problems:   Acute renal failure (ARF) (HCC)   COPD (chronic obstructive pulmonary disease) (HCC)   Community acquired pneumonia   Time coordinating discharge: 35 minutes  Signed:  Brendia Sacksaniel Scott Fix, MD [facility] 06/18/2015, 11:14 AM  By signing my name below, I, Burnett HarryJennifer Gregorio attest that this documentation has been prepared under the direction and in the presence of Brendia Sacksaniel Gia Lusher, MD Electronically signed: Burnett HarryJennifer Gregorio,  Scribe. 06/18/2015  I personally performed the services described in this documentation. All medical record entries made by the scribe were at my direction. I have reviewed the chart and agree that the record reflects my personal performance and is accurate and complete. Brendia Sacks, MD

## 2015-06-18 NOTE — Progress Notes (Signed)
Discharge instructions and prescriptions given, verbalized understanding, out in stable condition ambulatory with staff. 

## 2015-06-18 NOTE — Progress Notes (Signed)
PROGRESS NOTE  Catherine Butler ZOX:[id number] DOB: 10/12/1960 DOA: 06/16/2015 PCP: No PCP Per Patient [facility] for f/u care  Summary: [age] yow with a history of smoking presented with complaints of persistent productive cough and associated SOB, nausea, vomiting, and fever. Admitted for right-sided pneumonia and acute renal failure. CXR could not rule out underlying malignancy she will need CT scan of chest with contrast when her renal function improves. She has been started on broad spectrum abx.   Assessment/Plan: 1. CAP, right-lower lobe. Appears clinically resolved. CT scan reassuring WBC WNL. Currently afebrile.  2. Acute hypoxic respiratory failure, resolved. Stable on RA.   3. AKI, secondary to hypovolemia/dehydration. Dehydration, resolved with IVF.  4. Hypokalemia, repleted. 5. Normocytic anemia. Stable, a little lower today from IVF/rehydration. Follow-up as outpatient. 6. Hyponatremia, likely dehydration (also with low chloride). Resolved.  7. COPD, asthma. Stable.  8. Tobacco use disorder. Encourage cessation. 9. Depression, anxiety    Overall improved.   Discharge later today on oral abx.     Brendia Sacksaniel Gracelynn Bircher, MD  [facility]  Pager [phone] If 7PM-7AM, please contact night-coverage at [email or web address], password [email or web address] 06/18/2015, 7:29 AM  LOS: 2 days   Consultants:    Procedures:    Antibiotics:  Azithromycin 12/26>> 12/30  Ceftriaxone 12/26>> 12/27  Ceftin 12/28 >> 1/1  HPI/Subjective: Feels okay. Still has a mild cough but SOB is much improved. Feels ready to go home.  Objective: Filed Vitals:   06/17/15 1443 06/17/15 2022 06/17/15 2047 06/18/15 0508  BP:   100/48 110/72  Pulse:   99 105  Temp:   98.6 F (37 C) 98.3 F (36.8 C)  TempSrc:   Oral Oral  Resp:   20 20  Height:      Weight:      SpO2: 91% 94% 95% 95%    Intake/Output Summary (Last 24 hours) at 06/18/15 0729 Last data filed at 06/18/15 [id number]  Gross per 24 hour  Intake   2420 ml  Output      0 ml  Net   2420 ml     Filed Weights   06/16/15 1548 06/16/15 2047  Weight: 83.915 kg (185 lb) 92.625 kg (204 lb 3.2 oz)    Exam:    VSS, afebrile General:  Appears comfortable, calm. Cardiovascular: Regular rate and rhythm, no murmur, rub or gallop. No lower extremity edema. Respiratory: Clear anteriorally, right posterior base crackles.  Normal respiratory effort. Skin: no rash or induration  Musculoskeletal: grossly normal tone bilateral upper and lower extremities Psychiatric: grossly normal mood and affect, speech fluent and appropriate Neurologic: grossly non-focal.   New data reviewed:  Potassium wnl, creatinine 0.89, BUN 24  Hgb 9.4, CBC otherwise unremarkable  Pertinent data since admission:  Sodium 129, Potassium 3.0, BUN 28, Creatinine 2.28  WBC 17, Hgb 10.9  Pending data:  BC  Scheduled Meds: . azithromycin  500 mg Intravenous Q24H  . cefTRIAXone (ROCEPHIN)  IV  1 g Intravenous Q24H  . enoxaparin (LOVENOX) injection  40 mg Subcutaneous Q24H  . gabapentin  400 mg Oral TID  . guaiFENesin  1,200 mg Oral BID  . Influenza vac split quadrivalent PF  0.5 mL Intramuscular Tomorrow-1000  . ipratropium-albuterol  3 mL Nebulization Q6H  . pneumococcal 23 valent vaccine  0.5 mL Intramuscular Tomorrow-1000  . QUEtiapine  400 mg Oral QHS   Continuous Infusions: . sodium chloride 150 mL/hr at 06/17/15 1954    Active Problems:   CAP (community acquired  pneumonia)   Acute renal failure (ARF) (HCC)   COPD (chronic obstructive pulmonary disease) (HCC)   Community acquired pneumonia     By signing my name below, I, Burnett Harry attest that this documentation has been prepared under the direction and in the presence of Brendia Sacks, MD Electronically signed: Burnett Harry, Scribe.  06/18/2015 11:01am  I personally performed the services described in this documentation. All medical record entries  made by the scribe were at my direction. I have reviewed the chart and agree that the record reflects my personal performance and is accurate and complete. Brendia Sacks, MD

## 2015-06-18 NOTE — Care Management Note (Signed)
Case Management Note  Patient Details  Name: Catherine Butler MRN: [id number] Date of Birth: 09/16/1960  Expected Discharge Date:                  Expected Discharge Plan:  Home/Self Care  In-House Referral:  Financial Counselor  Discharge planning Services  CM Consult, [facility]t [facility]  Post Acute Care Choice:  NA Choice offered to:  NA  DME Arranged:    DME Agency:     HH Arranged:    HH Agency:     Status of Service:  Completed, signed off  Medicare Important Message Given:    Date Medicare IM Given:    Medicare IM give by:    Date Additional Medicare IM Given:    Additional Medicare Important Message give by:     If discussed at Long Length of Stay Meetings, dates discussed:    Additional Comments: Pt discharging home with elf care today. Pt [place] f[place] [facility] [place] Pt given application and list of items to bring to eligibility appointment. Pt given MATCH voucher and [facility] assist application. Pt has no further questions or concerns about DC.   Malcolm Metrohildress, Shaye Elling Demske, RN 06/18/2015, 4:16 PM

## 2015-06-21 LAB — CULTURE, BLOOD (ROUTINE X 2)
CULTURE: NO GROWTH
CULTURE: NO GROWTH

## 2015-08-07 ENCOUNTER — Ambulatory Visit (HOSPITAL_COMMUNITY): Payer: Self-pay

## 2015-08-07 ENCOUNTER — Other Ambulatory Visit: Payer: Self-pay

## 2015-08-15 ENCOUNTER — Ambulatory Visit (HOSPITAL_COMMUNITY): Payer: Self-pay

## 2015-08-15 ENCOUNTER — Other Ambulatory Visit: Payer: Self-pay

## 2016-06-21 DIAGNOSIS — G610 Guillain-Barre syndrome: Secondary | ICD-10-CM

## 2016-06-21 HISTORY — DX: Guillain-Barre syndrome: G61.0

## 2017-01-01 ENCOUNTER — Emergency Department (HOSPITAL_COMMUNITY): Payer: Self-pay

## 2017-01-01 ENCOUNTER — Inpatient Hospital Stay (HOSPITAL_COMMUNITY)
Admission: EM | Admit: 2017-01-01 | Discharge: 2017-01-03 | DRG: 872 | Disposition: A | Discharge status: Home / Self Care | Payer: Self-pay | Attending: Internal Medicine | Admitting: Internal Medicine

## 2017-01-01 ENCOUNTER — Encounter (HOSPITAL_COMMUNITY): Payer: Self-pay | Admitting: Emergency Medicine

## 2017-01-01 DIAGNOSIS — F32A Depression, unspecified: Secondary | ICD-10-CM | POA: Diagnosis present

## 2017-01-01 DIAGNOSIS — F17210 Nicotine dependence, cigarettes, uncomplicated: Secondary | ICD-10-CM | POA: Diagnosis present

## 2017-01-01 DIAGNOSIS — J9811 Atelectasis: Secondary | ICD-10-CM | POA: Diagnosis present

## 2017-01-01 DIAGNOSIS — R1031 Right lower quadrant pain: Secondary | ICD-10-CM

## 2017-01-01 DIAGNOSIS — K529 Noninfective gastroenteritis and colitis, unspecified: Secondary | ICD-10-CM | POA: Diagnosis present

## 2017-01-01 DIAGNOSIS — Z8249 Family history of ischemic heart disease and other diseases of the circulatory system: Secondary | ICD-10-CM

## 2017-01-01 DIAGNOSIS — K219 Gastro-esophageal reflux disease without esophagitis: Secondary | ICD-10-CM | POA: Diagnosis present

## 2017-01-01 DIAGNOSIS — R945 Abnormal results of liver function studies: Secondary | ICD-10-CM | POA: Diagnosis present

## 2017-01-01 DIAGNOSIS — Z720 Tobacco use: Secondary | ICD-10-CM | POA: Diagnosis present

## 2017-01-01 DIAGNOSIS — I10 Essential (primary) hypertension: Secondary | ICD-10-CM | POA: Diagnosis present

## 2017-01-01 DIAGNOSIS — J449 Chronic obstructive pulmonary disease, unspecified: Secondary | ICD-10-CM | POA: Diagnosis present

## 2017-01-01 DIAGNOSIS — A419 Sepsis, unspecified organism: Principal | ICD-10-CM | POA: Diagnosis present

## 2017-01-01 DIAGNOSIS — Z79899 Other long term (current) drug therapy: Secondary | ICD-10-CM

## 2017-01-01 DIAGNOSIS — F329 Major depressive disorder, single episode, unspecified: Secondary | ICD-10-CM | POA: Diagnosis present

## 2017-01-01 DIAGNOSIS — R7989 Other specified abnormal findings of blood chemistry: Secondary | ICD-10-CM | POA: Diagnosis present

## 2017-01-01 DIAGNOSIS — E876 Hypokalemia: Secondary | ICD-10-CM | POA: Diagnosis present

## 2017-01-01 DIAGNOSIS — R109 Unspecified abdominal pain: Secondary | ICD-10-CM | POA: Diagnosis present

## 2017-01-01 DIAGNOSIS — E860 Dehydration: Secondary | ICD-10-CM | POA: Diagnosis present

## 2017-01-01 LAB — URINALYSIS, ROUTINE W REFLEX MICROSCOPIC
BILIRUBIN URINE: NEGATIVE
Glucose, UA: NEGATIVE mg/dL
KETONES UR: NEGATIVE mg/dL
LEUKOCYTES UA: NEGATIVE
NITRITE: NEGATIVE
PROTEIN: NEGATIVE mg/dL
Specific Gravity, Urine: 1.004 — ABNORMAL LOW (ref 1.005–1.030)
pH: 6 (ref 5.0–8.0)

## 2017-01-01 LAB — COMPREHENSIVE METABOLIC PANEL
ALK PHOS: 143 U/L — AB (ref 38–126)
ALT: 66 U/L — ABNORMAL HIGH (ref 14–54)
ANION GAP: 11 (ref 5–15)
AST: 99 U/L — ABNORMAL HIGH (ref 15–41)
Albumin: 3.1 g/dL — ABNORMAL LOW (ref 3.5–5.0)
BUN: 5 mg/dL — ABNORMAL LOW (ref 6–20)
CALCIUM: 8.3 mg/dL — AB (ref 8.9–10.3)
CHLORIDE: 99 mmol/L — AB (ref 101–111)
CO2: 22 mmol/L (ref 22–32)
Creatinine, Ser: 0.94 mg/dL (ref 0.44–1.00)
GFR calc non Af Amer: >60 mL/min (ref >60)
Glucose, Bld: 132 mg/dL — ABNORMAL HIGH (ref 65–99)
Potassium: 3.6 mmol/L (ref 3.5–5.1)
SODIUM: 132 mmol/L — AB (ref 135–145)
Total Bilirubin: 1.2 mg/dL (ref 0.3–1.2)
Total Protein: 6.6 g/dL (ref 6.5–8.1)

## 2017-01-01 LAB — CBC WITH DIFFERENTIAL/PLATELET
BASOS PCT: 0 %
Basophils Absolute: 0 10*3/uL (ref 0.0–0.1)
Eosinophils Absolute: 0 10*3/uL (ref 0.0–0.7)
Eosinophils Relative: 0 %
HEMATOCRIT: 38.3 % (ref 36.0–46.0)
HEMOGLOBIN: 13.4 g/dL (ref 12.0–15.0)
Lymphocytes Relative: 19 %
Lymphs Abs: 2.4 10*3/uL (ref 0.7–4.0)
MCH: 36.1 pg — ABNORMAL HIGH (ref 26.0–34.0)
MCHC: 35 g/dL (ref 30.0–36.0)
MCV: 103.2 fL — ABNORMAL HIGH (ref 78.0–100.0)
Monocytes Absolute: 1 10*3/uL (ref 0.1–1.0)
Monocytes Relative: 8 %
NEUTROS ABS: 8.9 10*3/uL — AB (ref 1.7–7.7)
NEUTROS PCT: 73 %
Platelets: 204 10*3/uL (ref 150–400)
RBC: 3.71 MIL/uL — AB (ref 3.87–5.11)
RDW: 14.1 % (ref 11.5–15.5)
WBC: 12.3 10*3/uL — AB (ref 4.0–10.5)

## 2017-01-01 LAB — I-STAT CG4 LACTIC ACID, ED
LACTIC ACID, VENOUS: 0.44 mmol/L — AB (ref 0.5–1.9)
Lactic Acid, Venous: 4.17 mmol/L (ref 0.5–1.9)

## 2017-01-01 MED ORDER — PIPERACILLIN-TAZOBACTAM 3.375 G IVPB
3.3750 g | Freq: Three times a day (TID) | INTRAVENOUS | Status: DC
  Administered 2017-01-02: 3.375 g via INTRAVENOUS
  Filled 2017-01-01 (×2): qty 50

## 2017-01-01 MED ORDER — IOPAMIDOL (ISOVUE-300) INJECTION 61%
INTRAVENOUS | Status: AC
  Filled 2017-01-01: qty 100

## 2017-01-01 MED ORDER — HYDROXYZINE HCL 10 MG PO TABS
10.0000 mg | ORAL_TABLET | Freq: Three times a day (TID) | ORAL | Status: DC | PRN
  Filled 2017-01-01: qty 1

## 2017-01-01 MED ORDER — SODIUM CHLORIDE 0.9% FLUSH
3.0000 mL | Freq: Two times a day (BID) | INTRAVENOUS | Status: DC
  Administered 2017-01-02 – 2017-01-03 (×3): 3 mL via INTRAVENOUS

## 2017-01-01 MED ORDER — IOPAMIDOL (ISOVUE-300) INJECTION 61%
30.0000 mL | Freq: Once | INTRAVENOUS | Status: AC | PRN
  Administered 2017-01-01: 30 mL via ORAL

## 2017-01-01 MED ORDER — LEVALBUTEROL HCL 1.25 MG/0.5ML IN NEBU: 1.2500 mg | INHALATION_SOLUTION | Freq: Four times a day (QID) | RESPIRATORY_TRACT | Status: DC

## 2017-01-01 MED ORDER — NICOTINE 21 MG/24HR TD PT24
21.0000 mg | MEDICATED_PATCH | Freq: Every day | TRANSDERMAL | Status: DC
  Administered 2017-01-01 – 2017-01-03 (×3): 21 mg via TRANSDERMAL
  Filled 2017-01-01 (×3): qty 1

## 2017-01-01 MED ORDER — PANTOPRAZOLE SODIUM 40 MG PO TBEC
40.0000 mg | DELAYED_RELEASE_TABLET | Freq: Every day | ORAL | Status: DC
  Administered 2017-01-01 – 2017-01-03 (×3): 40 mg via ORAL
  Filled 2017-01-01 (×3): qty 1

## 2017-01-01 MED ORDER — SODIUM CHLORIDE 0.9 % IV BOLUS (SEPSIS)
1000.0000 mL | Freq: Once | INTRAVENOUS | Status: AC
  Administered 2017-01-01: 1000 mL via INTRAVENOUS

## 2017-01-01 MED ORDER — ONDANSETRON HCL 4 MG/2ML IJ SOLN
4.0000 mg | Freq: Once | INTRAMUSCULAR | Status: AC
  Administered 2017-01-01: 4 mg via INTRAVENOUS
  Filled 2017-01-01: qty 2

## 2017-01-01 MED ORDER — GABAPENTIN 400 MG PO CAPS
400.0000 mg | ORAL_CAPSULE | Freq: Four times a day (QID) | ORAL | Status: DC
  Administered 2017-01-01 – 2017-01-03 (×7): 400 mg via ORAL
  Filled 2017-01-01 (×7): qty 1

## 2017-01-01 MED ORDER — ZOLPIDEM TARTRATE 5 MG PO TABS: 5.0000 mg | ORAL_TABLET | Freq: Every evening | ORAL | Status: DC | PRN

## 2017-01-01 MED ORDER — ENOXAPARIN SODIUM 40 MG/0.4ML ~~LOC~~ SOLN
40.0000 mg | Freq: Every day | SUBCUTANEOUS | Status: DC
  Administered 2017-01-01 – 2017-01-02 (×2): 40 mg via SUBCUTANEOUS
  Filled 2017-01-01 (×2): qty 0.4

## 2017-01-01 MED ORDER — MORPHINE SULFATE (PF) 4 MG/ML IV SOLN
4.0000 mg | Freq: Once | INTRAVENOUS | Status: AC
  Administered 2017-01-01: 4 mg via INTRAVENOUS
  Filled 2017-01-01: qty 1

## 2017-01-01 MED ORDER — VANCOMYCIN HCL IN DEXTROSE 1-5 GM/200ML-% IV SOLN
1000.0000 mg | Freq: Once | INTRAVENOUS | Status: AC
  Administered 2017-01-01: 1000 mg via INTRAVENOUS
  Filled 2017-01-01: qty 200

## 2017-01-01 MED ORDER — IOPAMIDOL (ISOVUE-300) INJECTION 61%
100.0000 mL | Freq: Once | INTRAVENOUS | Status: AC | PRN
  Administered 2017-01-01: 100 mL via INTRAVENOUS

## 2017-01-01 MED ORDER — MORPHINE SULFATE (PF) 4 MG/ML IV SOLN
2.0000 mg | INTRAVENOUS | Status: DC | PRN
  Administered 2017-01-01 – 2017-01-03 (×5): 2 mg via INTRAVENOUS
  Filled 2017-01-01 (×5): qty 1

## 2017-01-01 MED ORDER — SODIUM CHLORIDE 0.9 % IV SOLN
INTRAVENOUS | Status: DC
  Administered 2017-01-01 – 2017-01-03 (×3): via INTRAVENOUS

## 2017-01-01 MED ORDER — IOPAMIDOL (ISOVUE-300) INJECTION 61%
INTRAVENOUS | Status: AC
  Filled 2017-01-01: qty 30

## 2017-01-01 MED ORDER — PIPERACILLIN-TAZOBACTAM 3.375 G IVPB 30 MIN
3.3750 g | Freq: Once | INTRAVENOUS | Status: DC
  Filled 2017-01-01: qty 50

## 2017-01-01 MED ORDER — FLUTICASONE PROPIONATE 50 MCG/ACT NA SUSP: 1.0000 | Freq: Every day | NASAL | Status: DC | PRN

## 2017-01-01 MED ORDER — MIRTAZAPINE 15 MG PO TABS
15.0000 mg | ORAL_TABLET | Freq: Every day | ORAL | Status: DC
  Administered 2017-01-01 – 2017-01-02 (×2): 15 mg via ORAL
  Filled 2017-01-01 (×2): qty 1

## 2017-01-01 MED ORDER — PIPERACILLIN-TAZOBACTAM 3.375 G IVPB 30 MIN
3.3750 g | Freq: Once | INTRAVENOUS | Status: DC
  Administered 2017-01-01: 3.375 g via INTRAVENOUS

## 2017-01-01 MED ORDER — VANCOMYCIN HCL IN DEXTROSE 1-5 GM/200ML-% IV SOLN
1000.0000 mg | Freq: Two times a day (BID) | INTRAVENOUS | Status: DC
  Administered 2017-01-02: 1000 mg via INTRAVENOUS
  Filled 2017-01-01: qty 200

## 2017-01-01 MED ORDER — DM-GUAIFENESIN ER 30-600 MG PO TB12: 1.0000 | ORAL_TABLET | Freq: Two times a day (BID) | ORAL | Status: DC | PRN

## 2017-01-01 MED ORDER — ALBUTEROL SULFATE (2.5 MG/3ML) 0.083% IN NEBU: 2.5000 mg | INHALATION_SOLUTION | RESPIRATORY_TRACT | Status: DC | PRN

## 2017-01-01 MED ORDER — QUETIAPINE FUMARATE 300 MG PO TABS
400.0000 mg | ORAL_TABLET | Freq: Every day | ORAL | Status: DC
  Administered 2017-01-01 – 2017-01-02 (×2): 400 mg via ORAL
  Filled 2017-01-01 (×2): qty 1

## 2017-01-01 NOTE — ED Provider Notes (Signed)
[facility]-EMERGENCY DEPT Provider Note   CSN: [id number] Arrival date & time: 01/01/17  1748     History   Chief Complaint Chief Complaint  Patient presents with  . Abdominal Pain  . Fever    HPI Catherine Butler is a 56 y.o. female.  56 year old female presents with one-day history of right lower quadrant abdominal pain with associated fever. Pain is been persistent and sharp and worse with any movement. Had emesis yesterday but not today. Denies any dysuria or hematuria. No vaginal bleeding or discharge. No prior history of same. Denies any black or bloody stools. No tumor use prior to arrival nothing makes her symptoms better      Past Medical History:  Diagnosis Date  . Anxiety   . Arthritis   . Asthma   . COPD (chronic obstructive pulmonary disease) (HCC)   . Depression   . Hypertension   . Reflux     Patient Active Problem List   Diagnosis Date Noted  . CAP (community acquired pneumonia) 06/16/2015  . Acute renal failure (ARF) (HCC) 06/16/2015  . COPD (chronic obstructive pulmonary disease) (HCC) 06/16/2015  . Community acquired pneumonia 06/16/2015    Past Surgical History:  Procedure Laterality Date  . LAPAROSCOPY    . laporscopy surgery for ovarian cyst  20 years ago  . TONSILLECTOMY      OB History    Gravida Para Term Preterm AB Living   3 1 1   1 1    SAB TAB Ectopic Multiple Live Births   1               Home Medications    Prior to Admission medications   Medication Sig Start Date End Date Taking? Authorizing Provider  albuterol (PROVENTIL HFA;VENTOLIN HFA) 108 (90 Base) MCG/ACT inhaler Inhale 2 puffs into the lungs every 6 (six) hours as needed for wheezing. 06/18/15   Standley Brooking, MD  azithromycin (ZITHROMAX) 500 MG tablet Take 1 tablet (500 mg total) by mouth at bedtime. 06/18/15   Standley Brooking, MD  cefUROXime (CEFTIN) 500 MG tablet Take 1 tablet (500 mg total) by mouth 2 (two) times daily with a meal. 06/18/15   Standley Brooking, MD  gabapentin (NEURONTIN) 400 MG capsule Take 400 mg by mouth 3 (three) times daily.    [provider]  QUEtiapine (SEROQUEL) 400 MG tablet Take 400 mg by mouth at bedtime.    [provider]    Family History Family History  Problem Relation Age of Onset  . Allergic rhinitis Paternal Grandfather   . Diabetes Father   . Hypertension Father 50       heart attack  . Breast cancer Mother 30       lump removed, bile duct cancer  . Hypertension Brother     Social History Social History  Substance Use Topics  . Smoking status: Current Every Day Smoker    Packs/day: 1.00    Years: 30.00    Types: Cigarettes  . Smokeless tobacco: Not on file     Comment: states "might be interested in the smoking cessation program"  Pt given counseling related to smoking cessation  . Alcohol use Yes     Comment: couple x a week     Allergies   Patient has no known allergies.   Review of Systems Review of Systems  All other systems reviewed and are negative.    Physical Exam Updated Vital Signs BP 123/87 (BP Location: Left  Arm)   Pulse (!) 126   Temp 99.1 F (37.3 C) (Oral)   Resp 18   Wt 86.2 kg (190 lb)   LMP 02/22/2006   SpO2 98%   BMI 30.67 kg/m   Physical Exam  Constitutional: She is oriented to person, place, and time. She appears well-developed and well-nourished.  Non-toxic appearance. No distress.  HENT:  Head: Normocephalic and atraumatic.  Eyes: Pupils are equal, round, and reactive to light. Conjunctivae, EOM and lids are normal.  Neck: Normal range of motion. Neck supple. No tracheal deviation present. No thyroid mass present.  Cardiovascular: Regular rhythm and normal heart sounds.  Tachycardia present.  Exam reveals no gallop.   No murmur heard. Pulmonary/Chest: Effort normal and breath sounds normal. No stridor. No respiratory distress. She has no decreased breath sounds. She has no wheezes. She has no rhonchi. She has no rales.  Abdominal:  Soft. Normal appearance and bowel sounds are normal. She exhibits no distension. There is no tenderness. There is no rebound and no CVA tenderness.    Musculoskeletal: Normal range of motion. She exhibits no edema or tenderness.  Neurological: She is alert and oriented to person, place, and time. She has normal strength. No cranial nerve deficit or sensory deficit. GCS eye subscore is 4. GCS verbal subscore is 5. GCS motor subscore is 6.  Skin: Skin is warm and dry. No abrasion and no rash noted.  Psychiatric: She has a normal mood and affect. Her speech is normal and behavior is normal.  Nursing note and vitals reviewed.    ED Treatments / Results  Labs (all labs ordered are listed, but only abnormal results are displayed) Labs Reviewed  CBC WITH DIFFERENTIAL/PLATELET - Abnormal; Notable for the following:       Result Value   WBC 12.3 (*)    RBC 3.71 (*)    MCV 103.2 (*)    MCH 36.1 (*)    Neutro Abs 8.9 (*)    All other components within normal limits  I-STAT CG4 LACTIC ACID, ED - Abnormal; Notable for the following:    Lactic Acid, Venous 4.17 (*)    All other components within normal limits  COMPREHENSIVE METABOLIC PANEL  URINALYSIS, ROUTINE W REFLEX MICROSCOPIC  I-STAT CG4 LACTIC ACID, ED    EKG  EKG Interpretation None       Radiology Dg Chest 2 View  Result Date: 01/01/2017 CLINICAL DATA:  RIGHT lower quadrant pain and fever upon awakening this morning, shortness of breath, COPD, asthma, hypertension, smoker EXAM: CHEST  2 VIEW COMPARISON:  06/16/2015 FINDINGS: Normal heart size and pulmonary vascularity. Moderate-sized hiatal hernia. LEFT basilar opacity question scarring with mild increased atelectasis or superimposed infiltrate. Remaining lungs clear. No pleural effusion or pneumothorax. Old LEFT rib fractures. Old superior endplate compression fracture of T12. IMPRESSION: Moderate-sized hiatal hernia. Increased LEFT basilar opacity likely representing scarring  with mild superimposed atelectasis or infiltrate. Electronically Signed   By: Ulyses Southward M.D.   On: 01/01/2017 18:30    Procedures Procedures (including critical care time)  Medications Ordered in ED Medications  sodium chloride 0.9 % bolus 1,000 mL (not administered)  ondansetron (ZOFRAN) injection 4 mg (not administered)  morphine 4 MG/ML injection 4 mg (not administered)  piperacillin-tazobactam (ZOSYN) IVPB 3.375 g (not administered)  sodium chloride 0.9 % bolus 1,000 mL (not administered)    And  sodium chloride 0.9 % bolus 1,000 mL (not administered)    And  sodium chloride 0.9 % bolus 1,000  mL (not administered)     Initial Impression / Assessment and Plan / ED Course  I have reviewed the triage vital signs and the nursing notes.  Pertinent labs & imaging results that were available during my care of the patient were reviewed by me and considered in my medical decision making (see chart for details).     High suspicion for perforated appendix based on the patient's physical exam and story.. Patient empirically started on antibiotics and does have elevated lactate. Given fluid bolus. Abdominal CT without findings for acute appendicitis. Scans discussed with the radiologist Dr. Pia MauBowles. Patient's urinalysis is negative. Chest x-ray without acute findings. She does not have any peritoneal signs at this time but does have tenderness in the right lower quadrant. Will admit to the medicine service  Final Clinical Impressions(s) / ED Diagnoses   Final diagnoses:  None    New Prescriptions New Prescriptions   No medications on file     Lorre NickAllen, Juliene Kirsh, MD 01/01/17 2018

## 2017-01-01 NOTE — Progress Notes (Signed)
Pharmacy Antibiotic Note  Catherine Butler is a 56 y.o. female admitted on 01/01/2017 with intra-abdominal infection.  Pharmacy has been consulted for Zosyn/Vancomycin dosing.  Plan: Zosyn 3.375g IV q8h (4 hour infusion).  Vancomycin 1 Gm IV q12h VT=15-20 mg/L Daily Scr Monitor clinical course, renal function, cultures as available     Height: 5\' 6"  (167.6 cm) Weight: 190 lb (86.2 kg) IBW/kg (Calculated) : 59.3  Temp (24hrs), Avg:98.6 F (37 C), Min:98.1 F (36.7 C), Max:99.1 F (37.3 C)   Recent Labs Lab 01/01/17 1804 01/01/17 1817 01/01/17 2035  WBC 12.3*  --   --   CREATININE 0.94  --   --   LATICACIDVEN  --  4.17* 0.44*    Estimated Creatinine Clearance: 75.7 mL/min (by C-G formula based on SCr of 0.94 mg/dL).    No Known Allergies  Antimicrobials this admission: 7/14 Zosyn >> 7/14 Vancomycin >>    Dose adjustments this admission: ---  Microbiology results: ---  Thank you for allowing pharmacy to be a part of this patient's care.   Lorenza EvangelistGreen, Sanjeev Main R 01/01/2017 9:17 PM

## 2017-01-01 NOTE — H&P (Signed)
History and Physical    Catherine Butler ZOX:[id number] DOB: Sep 11, 1960 DOA: 01/01/2017  Referring MD/NP/PA:   PCP: Patient, No Pcp Per   Patient coming from:  The patient is coming from home.  At baseline, pt is independent for most of ADL.      Chief Complaint: Abdominal pain or fever  HPI: Catherine Butler is a 56 y.o. female with medical history significant of COPD, asthma, GERD, depression, anxiety, tobacco abuse, who presents with abdominal pain and fever.  Patient states that her abdominal pain started him this morning. The abdominal pain is located right lower quadrant, constant, sharp, 7 out of 10 in severity, nonradiating. It is not aggravated or alleviated by any factors. She has nausea, no vomiting or diarrhea. She has fever and chills. Patient denies symptoms of UTI. No hematuria, hematemesis or hematochezia. Patient does not have cough, chest pain. She has mild shortness of breath due to COPD, which is at her baseline. No unilateral weakness.  ED Course: pt was found to have WBC 12.3, lactate of 4.17, AST 99, ALT 66, total bilirubin 1.2 and ALP 143, negative urinalysis, sodium 132, creatinine normal, temperature 99.1, tachycardia, oxygen saturation 98% on room air. Patient is admitted to telemetry bed as inpatient.  # CXR showed Increased LEFT basilar opacity likely representing scarring with mild superimposed atelectasis or infiltrate.  # CT-abd/pelvis: showed fatty infiltration of liver, otherwise, not impressive.   Review of Systems:   General: has fevers, chills, no changes in body weight, has poor appetite, has fatigue HEENT: no blurry vision, hearing changes or sore throat Respiratory: has mild dyspnea, no coughing, wheezing CV: no chest pain, no palpitations GI: has nausea, abdominal pain, no vomiting, diarrhea, constipation GU: no dysuria, burning on urination, increased urinary frequency, hematuria  Ext: no leg edema Neuro: no unilateral weakness, numbness, or  tingling, no vision change or hearing loss Skin: no rash, no skin tear. MSK: No muscle spasm, no deformity, no limitation of range of movement in spin Heme: No easy bruising.  Travel history: No recent long distant travel.  Allergy: No Known Allergies  Past Medical History:  Diagnosis Date  . Anxiety   . Arthritis   . Asthma   . COPD (chronic obstructive pulmonary disease) (HCC)   . Depression   . Hypertension   . Reflux     Past Surgical History:  Procedure Laterality Date  . LAPAROSCOPY    . laporscopy surgery for ovarian cyst  20 years ago  . TONSILLECTOMY      Social History:  reports that she has been smoking Cigarettes.  She has a 30.00 pack-year smoking history. She does not have any smokeless tobacco history on file. She reports that she drinks alcohol. She reports that she does not use drugs.  Family History:  Family History  Problem Relation Age of Onset  . Allergic rhinitis Paternal Grandfather   . Diabetes Father   . Hypertension Father 50       heart attack  . Breast cancer Mother 30       lump removed, bile duct cancer  . Hypertension Brother      Prior to Admission medications   Medication Sig Start Date End Date Taking? Authorizing Provider  albuterol (PROVENTIL HFA;VENTOLIN HFA) 108 (90 Base) MCG/ACT inhaler Inhale 2 puffs into the lungs every 6 (six) hours as needed for wheezing. 06/18/15   Standley Brooking, MD  azithromycin (ZITHROMAX) 500 MG tablet Take 1 tablet (500 mg total) by mouth  at bedtime. 06/18/15   Standley BrookingGoodrich, Daniel P, MD  cefUROXime (CEFTIN) 500 MG tablet Take 1 tablet (500 mg total) by mouth 2 (two) times daily with a meal. 06/18/15   Standley BrookingGoodrich, Daniel P, MD  gabapentin (NEURONTIN) 400 MG capsule Take 400 mg by mouth 3 (three) times daily.    [provider]  QUEtiapine (SEROQUEL) 400 MG tablet Take 400 mg by mouth at bedtime.    [provider]    Physical Exam: Vitals:   01/01/17 1757 01/01/17 1923 01/01/17 2011    BP: 123/87  136/90  Pulse: (!) 126  (!) 115  Resp: 18  (!) 25  Temp: 99.1 F (37.3 C)  98.1 F (36.7 C)  TempSrc: Oral  Oral  SpO2: 98%  99%  Weight: 86.2 kg (190 lb) 86.2 kg (190 lb)   Height:  5\' 6"  (1.676 m)    General: Not in acute distress HEENT:       Eyes: PERRL, EOMI, no scleral icterus.       ENT: No discharge from the ears and nose, no pharynx injection, no tonsillar enlargement.        Neck: No JVD, no bruit, no mass felt. Heme: No neck lymph node enlargement. Cardiac: S1/S2, RRR, No murmurs, No gallops or rubs. Respiratory:  No rales, wheezing, rhonchi or rubs. GI: Soft, nondistended, has tenderness in RLQ, no rebound pain, no organomegaly, BS present. GU: No hematuria Ext: No pitting leg edema bilaterally. 2+DP/PT pulse bilaterally. Musculoskeletal: No joint deformities, No joint redness or warmth, no limitation of ROM in spin. Skin: No rashes.  Neuro: Alert, oriented X3, cranial nerves II-XII grossly intact, moves all extremities normally.  Psych: Patient is not psychotic, no suicidal or hemocidal ideation.  Labs on Admission: I have personally reviewed following labs and imaging studies  CBC:  Recent Labs Lab 01/01/17 1804  WBC 12.3*  NEUTROABS 8.9*  HGB 13.4  HCT 38.3  MCV 103.2*  PLT 204   Basic Metabolic Panel:  Recent Labs Lab 01/01/17 1804  NA 132*  K 3.6  CL 99*  CO2 22  GLUCOSE 132*  BUN 5*  CREATININE 0.94  CALCIUM 8.3*   GFR: Estimated Creatinine Clearance: 75.7 mL/min (by C-G formula based on SCr of 0.94 mg/dL). Liver Function Tests:  Recent Labs Lab 01/01/17 1804  AST 99*  ALT 66*  ALKPHOS 143*  BILITOT 1.2  PROT 6.6  ALBUMIN 3.1*   No results for input(s): LIPASE, AMYLASE in the last 168 hours. No results for input(s): AMMONIA in the last 168 hours. Coagulation Profile: No results for input(s): INR, PROTIME in the last 168 hours. Cardiac Enzymes: No results for input(s): CKTOTAL, CKMB, CKMBINDEX, TROPONINI in the  last 168 hours. BNP (last 3 results) No results for input(s): PROBNP in the last 8760 hours. HbA1C: No results for input(s): HGBA1C in the last 72 hours. CBG: No results for input(s): GLUCAP in the last 168 hours. Lipid Profile: No results for input(s): CHOL, HDL, LDLCALC, TRIG, CHOLHDL, LDLDIRECT in the last 72 hours. Thyroid Function Tests: No results for input(s): TSH, T4TOTAL, FREET4, T3FREE, THYROIDAB in the last 72 hours. Anemia Panel: No results for input(s): VITAMINB12, FOLATE, FERRITIN, TIBC, IRON, RETICCTPCT in the last 72 hours. Urine analysis:    Component Value Date/Time   COLORURINE YELLOW 01/01/2017 1759   APPEARANCEUR CLEAR 01/01/2017 1759   LABSPEC 1.004 (L) 01/01/2017 1759   PHURINE 6.0 01/01/2017 1759   GLUCOSEU NEGATIVE 01/01/2017 1759   HGBUR SMALL (A) 01/01/2017  1759   BILIRUBINUR NEGATIVE 01/01/2017 1759   KETONESUR NEGATIVE 01/01/2017 1759   PROTEINUR NEGATIVE 01/01/2017 1759   UROBILINOGEN 0.2 09/13/2012 0124   NITRITE NEGATIVE 01/01/2017 1759   LEUKOCYTESUR NEGATIVE 01/01/2017 1759   Sepsis Labs: @LABRCNTIP (procalcitonin:4,lacticidven:4) )No results found for this or any previous visit (from the past 240 hour(s)).   Radiological Exams on Admission: Dg Chest 2 View  Result Date: 01/01/2017 CLINICAL DATA:  RIGHT lower quadrant pain and fever upon awakening this morning, shortness of breath, COPD, asthma, hypertension, smoker EXAM: CHEST  2 VIEW COMPARISON:  06/16/2015 FINDINGS: Normal heart size and pulmonary vascularity. Moderate-sized hiatal hernia. LEFT basilar opacity question scarring with mild increased atelectasis or superimposed infiltrate. Remaining lungs clear. No pleural effusion or pneumothorax. Old LEFT rib fractures. Old superior endplate compression fracture of T12. IMPRESSION: Moderate-sized hiatal hernia. Increased LEFT basilar opacity likely representing scarring with mild superimposed atelectasis or infiltrate. Electronically Signed   By:  Ulyses Southward M.D.   On: 01/01/2017 18:30   Ct Abdomen Pelvis W Contrast  Result Date: 01/01/2017 CLINICAL DATA:  RIGHT lower quadrant abdominal pain and fever since this morning, nausea yesterday, history hypertension, asthma, COPD EXAM: CT ABDOMEN AND PELVIS WITH CONTRAST TECHNIQUE: Multidetector CT imaging of the abdomen and pelvis was performed using the standard protocol following bolus administration of intravenous contrast. Sagittal and coronal MPR images reconstructed from axial data set. CONTRAST:  30mL ISOVUE-300 IOPAMIDOL (ISOVUE-300) INJECTION 61% PO, ISOVUE-300 IOPAMIDOL (ISOVUE-300) INJECTION 61% IV COMPARISON:  None ; correlation CT chest 06/18/2015 FINDINGS: Lower chest: LEFT lower lobe infiltrate question pneumonia Hepatobiliary: Diffuse fatty infiltration of liver. Gallbladder unremarkable. No focal hepatic mass lesion or biliary dilatation. Pancreas: Few parenchymal calcifications in pancreas without mass or ductal dilatation Spleen: Normal appearance Adrenals/Urinary Tract: Adrenal glands normal appearance. Kidneys, ureters, and bladder normal appearance Stomach/Bowel: Normal appendix. Stomach and bowel loops normal appearance. Vascular/Lymphatic: Atherosclerotic calcification aorta. Aorta normal caliber. No adenopathy. Reproductive: Normal appearing uterus and ovaries Other: No free air or free fluid. No hernia or inflammatory process. Musculoskeletal: Chronic superior endplate compression fracture of a lower thoracic vertebra, labeled T12 on prior CT chest unchanged. IMPRESSION: Fatty infiltration of liver. Normal appendix. No acute intra-abdominal or intrapelvic abnormalities. Aortic Atherosclerosis (ICD10-I70.0). Electronically Signed   By: Ulyses Southward M.D.   On: 01/01/2017 19:48     EKG: Independently reviewed.  Sinus rhythm, QTC 503, tachycardia, early R-wave progression  Assessment/Plan Principal Problem:   Abdominal pain Active Problems:   COPD (chronic obstructive  pulmonary disease) (HCC)   Depression   Sepsis (HCC)   Tobacco abuse   Abnormal LFTs   Abdominal pain and sepsis: Etiology is not clear for abdominal pain. CT scan of abdomen/pelvis is negative for acute findings. X-ray showed possible left lower lobe opacity, but patient does not have respiratory symptoms, less likely to have pneumonia. Patient meets criteria for sepsis with leukocytosis, fever, elevated lactate and tachycardia. Currently hemodynamically stable.  -will admit to tele bed as inpt -start IV vanco and zosyn -f/u Bx  -will get Procalcitonin and trend lactic acid levels per sepsis protocol. -IVF: 4L of NS bolus in ED, followed by 75 cc/h  - prn morphine for pain and hydroxyzine for nausea -Check lipase  Abnormal liver function tests: AST 99, ALT 66, total bilirubin 1.2 and ALP 143. Etiology is not clear, most likely due to sepsis. -Check hepatitis panel -Took a Tylenol level  COPD (chronic obstructive pulmonary disease) (HCC): stable -prn albuterol nebulizer  Depression: Stable, no  suicidal or homicidal ideations. -Continue home medications: Seroquel and Remeron  GERD: -Protonix  Tobacco abuse: -Did counseling about importance of quitting smoking -Nicotine patch  DVT ppx: SQ Lovenox Code Status: Full code Family Communication: None at bed side.   Disposition Plan:  Anticipate discharge back to previous home environment Consults called:  none Admission status:   Inpatient/tele    Date of Service 01/01/2017    Lorretta Harp [facility] Pager [phone]  If 7PM-7AM, please contact night-coverage [email or web address] Password [facility] 01/01/2017, 9:25 PM

## 2017-01-01 NOTE — ED Triage Notes (Addendum)
Pt reports she has been having RLQ abd pain and a fever of 102F since this am. Took tylenol this am. Still has appendix. Nauseated yesterday. No diarrhea.

## 2017-01-01 NOTE — Progress Notes (Signed)
Pharmacy Antibiotic Note  Catherine Butler is a 56 y.o. female admitted on 01/01/2017 with intra-abdominal infection.  Pharmacy has been consulted for Zosyn dosing.  Plan: Zosyn 3.375g IV q8h (4 hour infusion).  Monitor clinical course, renal function, cultures as available    Dosage will likely remain stable at above dosage and need for further dosage adjustment appears unlikely at present.    Will sign off at this time.  Please reconsult if a change in clinical status warrants re-evaluation of dosage.      Weight: 190 lb (86.2 kg)  Temp (24hrs), Avg:99.1 F (37.3 C), Min:99.1 F (37.3 C), Max:99.1 F (37.3 C)   Recent Labs Lab 01/01/17 1804 01/01/17 1817  WBC 12.3*  --   CREATININE 0.94  --   LATICACIDVEN  --  4.17*    CrCl cannot be calculated (Unknown ideal weight.).    No Known Allergies  Antimicrobials this admission: 7/14 Zosyn >>    Dose adjustments this admission: ---  Microbiology results: ---  Thank you for allowing pharmacy to be a part of this patient's care.  Adalberto ColeNikola Ameirah Khatoon, PharmD, BCPS Pager [phone] 01/01/2017 6:55 PM

## 2017-01-01 NOTE — ED Notes (Signed)
Hospitalist at bedside to evaluate pt.

## 2017-01-01 NOTE — ED Notes (Signed)
Pt assisted to restroom via steady.  

## 2017-01-02 ENCOUNTER — Encounter (HOSPITAL_COMMUNITY): Payer: Self-pay

## 2017-01-02 DIAGNOSIS — A419 Sepsis, unspecified organism: Principal | ICD-10-CM

## 2017-01-02 DIAGNOSIS — Z720 Tobacco use: Secondary | ICD-10-CM

## 2017-01-02 DIAGNOSIS — J449 Chronic obstructive pulmonary disease, unspecified: Secondary | ICD-10-CM

## 2017-01-02 DIAGNOSIS — R1031 Right lower quadrant pain: Secondary | ICD-10-CM

## 2017-01-02 DIAGNOSIS — F329 Major depressive disorder, single episode, unspecified: Secondary | ICD-10-CM

## 2017-01-02 DIAGNOSIS — R945 Abnormal results of liver function studies: Secondary | ICD-10-CM

## 2017-01-02 LAB — CBC
HCT: 33.6 % — ABNORMAL LOW (ref 36.0–46.0)
HEMOGLOBIN: 11.4 g/dL — AB (ref 12.0–15.0)
MCH: 35.2 pg — AB (ref 26.0–34.0)
MCHC: 33.9 g/dL (ref 30.0–36.0)
MCV: 103.7 fL — ABNORMAL HIGH (ref 78.0–100.0)
Platelets: 161 10*3/uL (ref 150–400)
RBC: 3.24 MIL/uL — ABNORMAL LOW (ref 3.87–5.11)
RDW: 14 % (ref 11.5–15.5)
WBC: 9.4 10*3/uL (ref 4.0–10.5)

## 2017-01-02 LAB — COMPREHENSIVE METABOLIC PANEL
ALBUMIN: 2.5 g/dL — AB (ref 3.5–5.0)
ALK PHOS: 118 U/L (ref 38–126)
ALT: 52 U/L (ref 14–54)
ANION GAP: 9 (ref 5–15)
AST: 65 U/L — AB (ref 15–41)
BUN: 6 mg/dL (ref 6–20)
CALCIUM: 7.2 mg/dL — AB (ref 8.9–10.3)
CO2: 21 mmol/L — ABNORMAL LOW (ref 22–32)
Chloride: 109 mmol/L (ref 101–111)
Creatinine, Ser: 0.69 mg/dL (ref 0.44–1.00)
GFR calc Af Amer: >60 mL/min (ref >60)
GFR calc non Af Amer: >60 mL/min (ref >60)
GLUCOSE: 106 mg/dL — AB (ref 65–99)
POTASSIUM: 3.1 mmol/L — AB (ref 3.5–5.1)
SODIUM: 139 mmol/L (ref 135–145)
Total Bilirubin: 0.8 mg/dL (ref 0.3–1.2)
Total Protein: 5.4 g/dL — ABNORMAL LOW (ref 6.5–8.1)

## 2017-01-02 LAB — PROTIME-INR
INR: 1
Prothrombin Time: 13.2 seconds (ref 11.4–15.2)

## 2017-01-02 LAB — LACTIC ACID, PLASMA: Lactic Acid, Venous: 1.3 mmol/L (ref 0.5–1.9)

## 2017-01-02 LAB — ACETAMINOPHEN LEVEL: Acetaminophen (Tylenol), Serum: <10 ug/mL — ABNORMAL LOW (ref 10–30)

## 2017-01-02 LAB — PROCALCITONIN: Procalcitonin: 0.87 ng/mL

## 2017-01-02 LAB — LIPASE, BLOOD: Lipase: 14 U/L (ref 11–51)

## 2017-01-02 LAB — GLUCOSE, CAPILLARY: Glucose-Capillary: 113 mg/dL — ABNORMAL HIGH (ref 65–99)

## 2017-01-02 MED ORDER — CHLORHEXIDINE GLUCONATE 0.12 % MT SOLN
15.0000 mL | Freq: Two times a day (BID) | OROMUCOSAL | Status: DC
  Administered 2017-01-02 – 2017-01-03 (×3): 15 mL via OROMUCOSAL
  Filled 2017-01-02 (×3): qty 15

## 2017-01-02 MED ORDER — PNEUMOCOCCAL VAC POLYVALENT 25 MCG/0.5ML IJ INJ
0.5000 mL | INJECTION | INTRAMUSCULAR | Status: AC
Start: 2017-01-03 — End: 2017-01-03
  Administered 2017-01-03: 0.5 mL via INTRAMUSCULAR
  Filled 2017-01-02: qty 0.5

## 2017-01-02 MED ORDER — CIPROFLOXACIN IN D5W 400 MG/200ML IV SOLN
400.0000 mg | Freq: Two times a day (BID) | INTRAVENOUS | Status: DC
  Administered 2017-01-02 – 2017-01-03 (×3): 400 mg via INTRAVENOUS
  Filled 2017-01-02 (×3): qty 200

## 2017-01-02 MED ORDER — ORAL CARE MOUTH RINSE
15.0000 mL | Freq: Two times a day (BID) | OROMUCOSAL | Status: DC
  Administered 2017-01-02 – 2017-01-03 (×2): 15 mL via OROMUCOSAL

## 2017-01-02 MED ORDER — METRONIDAZOLE IN NACL 5-0.79 MG/ML-% IV SOLN
500.0000 mg | Freq: Three times a day (TID) | INTRAVENOUS | Status: DC
  Administered 2017-01-02 – 2017-01-03 (×3): 500 mg via INTRAVENOUS
  Filled 2017-01-02 (×4): qty 100

## 2017-01-02 NOTE — Progress Notes (Signed)
IV ABT given in ED before blood cultures were drawn. MD Clyde LundborgNiu paged to be made aware. Lab aware and will draw labs soon.

## 2017-01-02 NOTE — Progress Notes (Signed)
[facility] PROGRESS NOTE  Catherine Butler ZOX:[id number] DOB: 11/19/1960 DOA: 01/01/2017 PCP: Catherine Butler, No Pcp Per  Interim summary and history of present illness 56 y.o. female with medical history significant of COPD, asthma, GERD, depression, anxiety, tobacco abuse, who presents with abdominal pain and fever. Catherine Butler with sepsis on admission presumed to be due to acute enteritis.  Assessment/Plan: 1-sepsis: Secondary to presumed enteritis -CT abdomen and pelvis negative for acute findings -Septic features resolved by now -Catherine Butler is afebrile and with normal WBCs -Will narrow antibiotics and continue supportive care/IV fluids -Will advance diet  2-abnormal liver function test -Most likely associated with dehydration -LFTs improving/resolved with IV fluids -Will follow trend -Hepatitis panel negative  3-COPD: -Stable and currently no wheezing. -Continue as needed albuterol  4-hypokalemia -Due to GI losses prior to admission. -Will replete as needed -Will check magnesium level  5-depression -stable mood, no suicidal ideation or hallucinations. -Continue Seroquel and Remeron  6-GERD: -Continue PPI  7-tobacco abuse -Cessation counseling has been provided -Nicotine patch will be continue.  Code Status: Full code Family Communication: No family at bedside Disposition Plan: Catherine Butler improve and essentially with resolution septic features; will continue supportive care, IV fluids, as needed antiemetics and analgesics; given no growth or ongoing active infection. Her antibiotics will be narrowed to Cipro and Flagyl and will start advancing diet.   Consultants:  None  Procedures:  See below for x-ray reports  Antibiotics:  Vancomycin and Zosyn  7/14>>>7/15  Ciprofloxacin and Flagyl 7/15  HPI/Subjective: Afebrile, no chest pain, no shortness of breath did Catherine Butler reports no vomiting or nausea currently and even she is still have some abdominal pain will like to  try something to eat.  Objective: Vitals:   01/02/17 0453 01/02/17 1437  BP: (!) 140/91 113/87  Pulse: 71 97  Resp: 18 20  Temp: 98.4 F (36.9 C) 98.9 F (37.2 C)    Intake/Output Summary (Last 24 hours) at 01/02/17 1933 Last data filed at 01/02/17 1854  Gross per 24 hour  Intake             1680 ml  Output             1900 ml  Net             -220 ml   Filed Weights   01/01/17 1757 01/01/17 1923 01/01/17 2147  Weight: 86.2 kg (190 lb) 86.2 kg (190 lb) 89 kg (196 lb 3.4 oz)    Exam:   General:  Afebrile, denies chest pain, no shortness of breath, currently still with some abdominal pain but would like to try something by mouth.  Cardiovascular: S1 and S2, no rubs, no gallops, no JVD  Respiratory: Good air movement bilaterally, no wheezing, no crackles  Abdomen: Soft, no guarding, positive tenderness to palpation on her right lower quadrant; mild rebound appreciated, positive bowel sounds.  Musculoskeletal: No edema, no cyanosis, no clubbing.  Data Reviewed: Basic Metabolic Panel:  Recent Labs Lab 01/01/17 1804 01/02/17 0041  NA 132* 139  K 3.6 3.1*  CL 99* 109  CO2 22 21*  GLUCOSE 132* 106*  BUN 5* 6  CREATININE 0.94 0.69  CALCIUM 8.3* 7.2*   Liver Function Tests:  Recent Labs Lab 01/01/17 1804 01/02/17 0041  AST 99* 65*  ALT 66* 52  ALKPHOS 143* 118  BILITOT 1.2 0.8  PROT 6.6 5.4*  ALBUMIN 3.1* 2.5*    Recent Labs Lab 01/02/17 0041  LIPASE 14   CBC:  Recent Labs  Lab 01/01/17 1804 01/02/17 0041  WBC 12.3* 9.4  NEUTROABS 8.9*  --   HGB 13.4 11.4*  HCT 38.3 33.6*  MCV 103.2* 103.7*  PLT 204 161   CBG:  Recent Labs Lab 01/02/17 0805  GLUCAP 113*    No results found for this or any previous visit (from the past 240 hour(s)).   Studies: Dg Chest 2 View  Result Date: 01/01/2017 CLINICAL DATA:  RIGHT lower quadrant pain and fever upon awakening this morning, shortness of breath, COPD, asthma, hypertension, smoker EXAM: CHEST   2 VIEW COMPARISON:  06/16/2015 FINDINGS: Normal heart size and pulmonary vascularity. Moderate-sized hiatal hernia. LEFT basilar opacity question scarring with mild increased atelectasis or superimposed infiltrate. Remaining lungs clear. No pleural effusion or pneumothorax. Old LEFT rib fractures. Old superior endplate compression fracture of T12. IMPRESSION: Moderate-sized hiatal hernia. Increased LEFT basilar opacity likely representing scarring with mild superimposed atelectasis or infiltrate. Electronically Signed   By: Ulyses Southward M.D.   On: 01/01/2017 18:30   Ct Abdomen Pelvis W Contrast  Result Date: 01/01/2017 CLINICAL DATA:  RIGHT lower quadrant abdominal pain and fever since this morning, nausea yesterday, history hypertension, asthma, COPD EXAM: CT ABDOMEN AND PELVIS WITH CONTRAST TECHNIQUE: Multidetector CT imaging of the abdomen and pelvis was performed using the standard protocol following bolus administration of intravenous contrast. Sagittal and coronal MPR images reconstructed from axial data set. CONTRAST:  30mL ISOVUE-300 IOPAMIDOL (ISOVUE-300) INJECTION 61% PO, ISOVUE-300 IOPAMIDOL (ISOVUE-300) INJECTION 61% IV COMPARISON:  None ; correlation CT chest 06/18/2015 FINDINGS: Lower chest: LEFT lower lobe infiltrate question pneumonia Hepatobiliary: Diffuse fatty infiltration of liver. Gallbladder unremarkable. No focal hepatic mass lesion or biliary dilatation. Pancreas: Few parenchymal calcifications in pancreas without mass or ductal dilatation Spleen: Normal appearance Adrenals/Urinary Tract: Adrenal glands normal appearance. Kidneys, ureters, and bladder normal appearance Stomach/Bowel: Normal appendix. Stomach and bowel loops normal appearance. Vascular/Lymphatic: Atherosclerotic calcification aorta. Aorta normal caliber. No adenopathy. Reproductive: Normal appearing uterus and ovaries Other: No free air or free fluid. No hernia or inflammatory process. Musculoskeletal: Chronic  superior endplate compression fracture of a lower thoracic vertebra, labeled T12 on prior CT chest unchanged. IMPRESSION: Fatty infiltration of liver. Normal appendix. No acute intra-abdominal or intrapelvic abnormalities. Aortic Atherosclerosis (ICD10-I70.0). Electronically Signed   By: Ulyses Southward M.D.   On: 01/01/2017 19:48    Scheduled Meds: . chlorhexidine  15 mL Mouth Rinse BID  . enoxaparin (LOVENOX) injection  40 mg Subcutaneous QHS  . gabapentin  400 mg Oral QID  . mouth rinse  15 mL Mouth Rinse q12n4p  . mirtazapine  15 mg Oral QHS  . nicotine  21 mg Transdermal Daily  . pantoprazole  40 mg Oral Daily  . [START ON 01/03/2017] pneumococcal 23 valent vaccine  0.5 mL Intramuscular Tomorrow-1000  . QUEtiapine  400 mg Oral QHS  . sodium chloride flush  3 mL Intravenous Q12H   Continuous Infusions: . sodium chloride 75 mL/hr at 01/02/17 1120  . ciprofloxacin Stopped (01/02/17 1221)  . metronidazole Stopped (01/02/17 1335)  . piperacillin-tazobactam      Principal Problem:   Abdominal pain Active Problems:   COPD (chronic obstructive pulmonary disease) (HCC)   Depression   Sepsis (HCC)   Tobacco abuse   Abnormal LFTs    Time spent: 30 minutes    Vassie Loll  [facility] Pager [phone]. If 7PM-7AM, please contact night-coverage at [email or web address], password [facility] 01/02/2017, 7:33 PM  LOS: 1 day

## 2017-01-03 ENCOUNTER — Telehealth: Payer: Self-pay

## 2017-01-03 LAB — BASIC METABOLIC PANEL
ANION GAP: 7 (ref 5–15)
BUN: <5 mg/dL — ABNORMAL LOW (ref 6–20)
CALCIUM: 7.4 mg/dL — AB (ref 8.9–10.3)
CO2: 21 mmol/L — AB (ref 22–32)
Chloride: 111 mmol/L (ref 101–111)
Creatinine, Ser: 0.63 mg/dL (ref 0.44–1.00)
GLUCOSE: 106 mg/dL — AB (ref 65–99)
POTASSIUM: 3.2 mmol/L — AB (ref 3.5–5.1)
Sodium: 139 mmol/L (ref 135–145)

## 2017-01-03 LAB — CBC WITH DIFFERENTIAL/PLATELET
BASOS ABS: 0 10*3/uL (ref 0.0–0.1)
BASOS PCT: 0 %
Eosinophils Absolute: 0.1 10*3/uL (ref 0.0–0.7)
Eosinophils Relative: 2 %
HEMATOCRIT: 30.7 % — AB (ref 36.0–46.0)
HEMOGLOBIN: 10.4 g/dL — AB (ref 12.0–15.0)
LYMPHS PCT: 17 %
Lymphs Abs: 1.1 10*3/uL (ref 0.7–4.0)
MCH: 35.6 pg — ABNORMAL HIGH (ref 26.0–34.0)
MCHC: 33.9 g/dL (ref 30.0–36.0)
MCV: 105.1 fL — AB (ref 78.0–100.0)
Monocytes Absolute: 0.6 10*3/uL (ref 0.1–1.0)
Monocytes Relative: 9 %
NEUTROS ABS: 4.8 10*3/uL (ref 1.7–7.7)
NEUTROS PCT: 72 %
Platelets: 136 10*3/uL — ABNORMAL LOW (ref 150–400)
RBC: 2.92 MIL/uL — AB (ref 3.87–5.11)
RDW: 14 % (ref 11.5–15.5)
WBC: 6.6 10*3/uL (ref 4.0–10.5)

## 2017-01-03 LAB — MAGNESIUM: Magnesium: 1.1 mg/dL — ABNORMAL LOW (ref 1.7–2.4)

## 2017-01-03 LAB — HIV ANTIBODY (ROUTINE TESTING W REFLEX): HIV SCREEN 4TH GENERATION: NONREACTIVE

## 2017-01-03 LAB — GLUCOSE, CAPILLARY: GLUCOSE-CAPILLARY: 106 mg/dL — AB (ref 65–99)

## 2017-01-03 MED ORDER — OMEPRAZOLE 40 MG PO CPDR: 40.0000 mg | DELAYED_RELEASE_CAPSULE | Freq: Two times a day (BID) | ORAL | 1 refills | Status: DC

## 2017-01-03 MED ORDER — AMOXICILLIN-POT CLAVULANATE 875-125 MG PO TABS: 1.0000 | ORAL_TABLET | Freq: Two times a day (BID) | ORAL | 0 refills | Status: DC

## 2017-01-03 MED ORDER — NICOTINE 21 MG/24HR TD PT24: 21.0000 mg | MEDICATED_PATCH | Freq: Every day | TRANSDERMAL | 0 refills | Status: DC

## 2017-01-03 MED ORDER — POTASSIUM CHLORIDE CRYS ER 20 MEQ PO TBCR
40.0000 meq | EXTENDED_RELEASE_TABLET | ORAL | Status: DC
  Administered 2017-01-03: 40 meq via ORAL
  Filled 2017-01-03 (×2): qty 2

## 2017-01-03 MED ORDER — AMOXICILLIN-POT CLAVULANATE 875-125 MG PO TABS
1.0000 | ORAL_TABLET | Freq: Two times a day (BID) | ORAL | Status: DC
  Administered 2017-01-03: 1 via ORAL
  Filled 2017-01-03: qty 1

## 2017-01-03 MED ORDER — ONDANSETRON 4 MG PO TBDP: 4.0000 mg | ORAL_TABLET | Freq: Four times a day (QID) | ORAL | Status: DC | PRN

## 2017-01-03 MED ORDER — ONDANSETRON HCL 4 MG/2ML IJ SOLN
4.0000 mg | Freq: Four times a day (QID) | INTRAMUSCULAR | Status: DC | PRN
  Filled 2017-01-03: qty 2

## 2017-01-03 MED ORDER — ONDANSETRON 8 MG PO TBDP: 8.0000 mg | ORAL_TABLET | Freq: Three times a day (TID) | ORAL | 0 refills | Status: DC | PRN

## 2017-01-03 NOTE — Telephone Encounter (Signed)
Message received from Lanier ClamKathy Mahabir, RN CM requesting a hospital follow up [facility]. [facility]18[facility]17 @ 1330 [facility] and the information was placed on the AVS.  Update provided to K. Mahabir, RN CM

## 2017-01-03 NOTE — Care Management Note (Signed)
Case Management Note  Patient Details  Name: Catherine Butler MRN: [id number] Date of Birth: 05/30/1961  Subjective/Objective: 56 y/o f admitted w/Abdominal pain. From home. No pcp, or health insurance. Provided w/pcp f/u appt through [facility] liason-appt set-patient voiced understanding. No further CM needs.                   Action/Plan:d/c home.   Expected Discharge Date:                  Expected Discharge Plan:  Home/Self Care  In-House Referral:     Discharge planning Services  CM Consult, [facility], Follow-up appt scheduled  Post Acute Care Choice:    Choice offered to:     DME Arranged:    DME Agency:     HH Arranged:    HH Agency:     Status of Service:  Completed, signed off  If discussed at [employer] Meetings, dates discussed:    Additional Comments:  Lanier ClamMahabir, Magie Ciampa, RN 01/03/2017, 12:20 PM

## 2017-01-03 NOTE — Progress Notes (Signed)
Physician Discharge Summary  Catherine Butler ZOX:[id number] DOB: August 07, 1960 DOA: 01/01/2017  PCP: Patient, No Pcp Per  Admit date: 01/01/2017 Discharge date: 01/03/2017  Time spent: 35 minutes  Recommendations for Outpatient Follow-up:  1. Repeat CMET to follow electrolytes, LFT's and renal failure 2. Assist patient with tobacco quitting  3. Reassess BP; BP was intermittently elevated, most likely from pain. 4. Repeat CXR in 4-6 weeks to assure resolution of LLL infiltrates.   Discharge Diagnoses:  Principal Problem:   Abdominal pain Active Problems:   COPD (chronic obstructive pulmonary disease) (HCC)   Depression   Sepsis (HCC)   Tobacco abuse   Abnormal LFTs   Discharge Condition: stable and improved. Discharge home with instructions to follow up with [facility] to establish care and to have hospital follow up.  Diet recommendation: heart healthy diet   Filed Weights   01/01/17 1757 01/01/17 1923 01/01/17 2147  Weight: 86.2 kg (190 lb) 86.2 kg (190 lb) 89 kg (196 lb 3.4 oz)    History of present illness:  56 y.o.femalewith medical history significant of COPD, asthma, GERD, depression, anxiety, tobacco abuse, who presents with abdominal pain and fever. Patient with sepsis on admission presumed to be due to acute enteritis.  Hospital Course:  1-sepsis: Secondary to presumed enteritis -CT abdomen and pelvis negative for acute findings -Septic features resolved by now; Patient is afebrile and with normal WBCs at discharge -Will narrow antibiotics and discharge on augmentin for 7 more days -diet advanced and tolerated   2-abnormal liver function test -Most likely associated with dehydration and sepsis presentation  -LFTs improving/resolved with IV fluids -Will recommend to repeat LFT's at follow up to reassess trend  -Hepatitis panel negative  3-COPD: -Stable and currently no wheezing. -Continue as needed albuterol -patient encouraged to stop smoking    4-hypokalemia -Due to GI losses prior to admission. -repleted -will recommend BMET to follow electrolytes at follow up visit   5-depression -stable mood, no suicidal ideation or hallucinations. -Continue Seroquel and Remeron  6-GERD: -Continue PPI  7-tobacco abuse -Cessation counseling has been provided -Nicotine patch will be order at discharge.  8-LLL infiltrates -no symptoms of PNA -current antibiotics will cover anyway; in case of atypical presentation -given hx of smoking, will recommend repeat CXR in 4-6 weeks to assure complete resolution Vs need for further imaging to r/o underlying malignancy   Procedures:  See below for x-ray reports   Consultations:  None   Discharge Exam: Vitals:   01/03/17 0536 01/03/17 1357  BP: 122/86 128/85  Pulse: (!) 106 (!) 101  Resp: 17 13  Temp: 98.6 F (37 C) 98.4 F (36.9 C)    General:  Afebrile, denies chest pain, no shortness of breath, currently still with some abdominal pain but would like to try something by mouth.  Cardiovascular: S1 and S2, no rubs, no gallops, no JVD  Respiratory: Good air movement bilaterally, no wheezing, no crackles  Abdomen: Soft, no guarding, positive tenderness to palpation on her right lower quadrant, but much improved in comparison to admission; no rebound appreciated today, positive bowel sounds.  Musculoskeletal: No edema, no cyanosis, no clubbing.   Discharge Instructions   Discharge Instructions    Discharge instructions    Complete by:  As directed    Stop smoking Take medications as prescribed Please follow up to establish care with PCP as instructed  Follow a low calorie diet Maintain adequate hydration     Current Discharge Medication List    START  taking these medications   Details  amoxicillin-clavulanate (AUGMENTIN) 875-125 MG tablet Take 1 tablet by mouth every 12 (twelve) hours. Qty: 14 tablet, Refills: 0    nicotine (NICODERM CQ - DOSED IN MG/24 HOURS)  21 mg/24hr patch Place 1 patch (21 mg total) onto the skin daily. Qty: 28 patch, Refills: 0    ondansetron (ZOFRAN-ODT) 8 MG disintegrating tablet Take 1 tablet (8 mg total) by mouth every 8 (eight) hours as needed for nausea or vomiting. Qty: 30 tablet, Refills: 0      CONTINUE these medications which have CHANGED   Details  omeprazole (PRILOSEC) 40 MG capsule Take 1 capsule (40 mg total) by mouth 2 (two) times daily. Qty: 60 capsule, Refills: 1      CONTINUE these medications which have NOT CHANGED   Details  acetaminophen (TYLENOL) 500 MG tablet Take 1,000 mg by mouth 2 (two) times daily as needed for headache.    albuterol (PROVENTIL HFA;VENTOLIN HFA) 108 (90 Base) MCG/ACT inhaler Inhale 2 puffs into the lungs every 6 (six) hours as needed for wheezing. Qty: 1 Inhaler, Refills: 0    fluticasone (FLONASE) 50 MCG/ACT nasal spray Place 1 spray into both nostrils daily as needed for allergies or rhinitis.    gabapentin (NEURONTIN) 400 MG capsule Take 400 mg by mouth 4 (four) times daily.     ibuprofen (ADVIL,MOTRIN) 200 MG tablet Take 400 mg by mouth 2 (two) times daily as needed for headache.    mirtazapine (REMERON) 15 MG tablet Take 15 mg by mouth at bedtime.    QUEtiapine (SEROQUEL) 400 MG tablet Take 400 mg by mouth at bedtime.       No Known Allergies Follow-up Information    [facility]. Go on 01/18/2017.   Why:  at 1:30pm for a hospital follow up appointment. Please bring all of your medications with you to the clinic.  Contact information: [address] [place] [phone]           The results of significant diagnostics from this hospitalization (including imaging, microbiology, ancillary and laboratory) are listed below for reference.    Significant Diagnostic Studies: Dg Chest 2 View  Result Date: 01/01/2017 CLINICAL DATA:  RIGHT lower quadrant pain and fever upon awakening this morning,  shortness of breath, COPD, asthma, hypertension, smoker EXAM: CHEST  2 VIEW COMPARISON:  06/16/2015 FINDINGS: Normal heart size and pulmonary vascularity. Moderate-sized hiatal hernia. LEFT basilar opacity question scarring with mild increased atelectasis or superimposed infiltrate. Remaining lungs clear. No pleural effusion or pneumothorax. Old LEFT rib fractures. Old superior endplate compression fracture of T12. IMPRESSION: Moderate-sized hiatal hernia. Increased LEFT basilar opacity likely representing scarring with mild superimposed atelectasis or infiltrate. Electronically Signed   By: Ulyses SouthwardMark  Boles M.D.   On: 01/01/2017 18:30   Ct Abdomen Pelvis W Contrast  Result Date: 01/01/2017 CLINICAL DATA:  RIGHT lower quadrant abdominal pain and fever since this morning, nausea yesterday, history hypertension, asthma, COPD EXAM: CT ABDOMEN AND PELVIS WITH CONTRAST TECHNIQUE: Multidetector CT imaging of the abdomen and pelvis was performed using the standard protocol following bolus administration of intravenous contrast. Sagittal and coronal MPR images reconstructed from axial data set. CONTRAST:  30mL ISOVUE-300 IOPAMIDOL (ISOVUE-300) INJECTION 61% PO, 100mL ISOVUE-300 IOPAMIDOL (ISOVUE-300) INJECTION 61% IV COMPARISON:  None ; correlation CT chest 06/18/2015 FINDINGS: Lower chest: LEFT lower lobe infiltrate question pneumonia Hepatobiliary: Diffuse fatty infiltration of liver. Gallbladder unremarkable. No focal hepatic mass lesion or biliary dilatation.  Pancreas: Few parenchymal calcifications in pancreas without mass or ductal dilatation Spleen: Normal appearance Adrenals/Urinary Tract: Adrenal glands normal appearance. Kidneys, ureters, and bladder normal appearance Stomach/Bowel: Normal appendix. Stomach and bowel loops normal appearance. Vascular/Lymphatic: Atherosclerotic calcification aorta. Aorta normal caliber. No adenopathy. Reproductive: Normal appearing uterus and ovaries Other: No free air or free  fluid. No hernia or inflammatory process. Musculoskeletal: Chronic superior endplate compression fracture of a lower thoracic vertebra, labeled T12 on prior CT chest unchanged. IMPRESSION: Fatty infiltration of liver. Normal appendix. No acute intra-abdominal or intrapelvic abnormalities. Aortic Atherosclerosis (ICD10-I70.0). Electronically Signed   By: Ulyses Southward M.D.   On: 01/01/2017 19:48    Microbiology: Recent Results (from the past 240 hour(s))  Culture, blood (x 2)     Status: None (Preliminary result)   Collection Time: 01/02/17 12:41 AM  Result Value Ref Range Status   Specimen Description BLOOD BLOOD RIGHT FOREARM  Final   Special Requests IN PEDIATRIC BOTTLE Blood Culture adequate volume  Final   Culture   Final    NO GROWTH 1 DAY Performed at [facility], [address] [place], [place] [address]    Report Status PENDING  Incomplete  Culture, blood (x 2)     Status: None (Preliminary result)   Collection Time: 01/02/17 12:41 AM  Result Value Ref Range Status   Specimen Description BLOOD RIGHT WRIST  Final   Special Requests IN PEDIATRIC BOTTLE Blood Culture adequate volume  Final   Culture   Final    NO GROWTH 1 DAY Performed at [facility], [address], [place], [place] [address]    Report Status PENDING  Incomplete     Labs: Basic Metabolic Panel:  Recent Labs Lab 01/01/17 1804 01/02/17 0041 01/03/17 0805  NA 132* 139 139  K 3.6 3.1* 3.2*  CL 99* 109 111  CO2 22 21* 21*  GLUCOSE 132* 106* 106*  BUN 5* 6 <5*  CREATININE 0.94 0.69 0.63  CALCIUM 8.3* 7.2* 7.4*  MG  --   --  1.1*   Liver Function Tests:  Recent Labs Lab 01/01/17 1804 01/02/17 0041  AST 99* 65*  ALT 66* 52  ALKPHOS 143* 118  BILITOT 1.2 0.8  PROT 6.6 5.4*  ALBUMIN 3.1* 2.5*    Recent Labs Lab 01/02/17 0041  LIPASE 14   No results for input(s): AMMONIA in the last 168 hours. CBC:  Recent Labs Lab 01/01/17 1804 01/02/17 0041 01/03/17 0805  WBC 12.3* 9.4  6.6  NEUTROABS 8.9*  --  4.8  HGB 13.4 11.4* 10.4*  HCT 38.3 33.6* 30.7*  MCV 103.2* 103.7* 105.1*  PLT 204 161 136*    CBG:  Recent Labs Lab 01/02/17 0805 01/03/17 0745  GLUCAP 113* 106*    Signed:  Vassie Loll MD.  [facility] 01/03/2017, 3:08 PM

## 2017-01-04 LAB — HEPATITIS PANEL, ACUTE
HEP A IGM: NEGATIVE
Hep B C IgM: NEGATIVE
Hepatitis B Surface Ag: NEGATIVE

## 2017-01-04 NOTE — Discharge Summary (Signed)
Catherine Butler WGN:[id number] DOB: 09/23/1960 DOA: 01/01/2017  PCP: Patient, No Pcp Per  Admit date: 01/01/2017 Discharge date: 01/03/2017  Time spent: 35 minutes  Recommendations for Outpatient Follow-up:  1. Repeat CMET to follow electrolytes, LFT's and renal failure 2. Assist patient with tobacco quitting  3. Reassess BP; BP was intermittently elevated, most likely from pain. 4. Repeat CXR in 4-6 weeks to assure resolution of LLL infiltrates.   Discharge Diagnoses:  Principal Problem:   Abdominal pain Active Problems:   COPD (chronic obstructive pulmonary disease) (HCC)   Depression   Sepsis (HCC)   Tobacco abuse   Abnormal LFTs   Discharge Condition: stable and improved. Discharge home with instructions to follow up with [facility] to establish care and to have hospital follow up.  Diet recommendation: heart healthy diet        Filed Weights   01/01/17 1757 01/01/17 1923 01/01/17 2147  Weight: 86.2 kg (190 lb) 86.2 kg (190 lb) 89 kg (196 lb 3.4 oz)    History of present illness:  56 y.o.femalewith medical history significant of COPD, asthma, GERD, depression, anxiety, tobacco abuse, who presents with abdominal pain and fever.Patient with sepsis on admission presumed to be due to acute enteritis.  Hospital Course:  1-sepsis: Secondary to presumed enteritis -CT abdomen and pelvis negative for acute findings -Septic features resolvedby now; Patient is afebrile and with normal WBCs at discharge -Will narrow antibiotics and discharge on augmentin for 7 more days -diet advanced and tolerated   2-abnormal liver function test -Most likely associated with dehydration and sepsis presentation  -LFTs improving/resolved with IV fluids -Will recommend to repeat LFT's at follow up to reassess trend  -Hepatitis panel negative  3-COPD: -Stable and currently no wheezing. -Continue as needed albuterol -patient encouraged to stop smoking    4-hypokalemia -Due to GI losses prior to admission. -repleted -will recommend BMET to follow electrolytes at follow up visit   5-depression -stable mood, no suicidal ideation or hallucinations. -Continue Seroquel and Remeron  6-GERD: -Continue PPI  7-tobacco abuse -Cessation counseling has been provided -Nicotine patch will be order at discharge.  8-LLL infiltrates -no symptoms of PNA -current antibiotics will cover anyway; in case of atypical presentation -given hx of smoking, will recommend repeat CXR in 4-6 weeks to assure complete resolution Vs need for further imaging to r/o underlying malignancy   Procedures:  See below for x-ray reports   Consultations:  None   Discharge Exam:     Vitals:   01/03/17 0536 01/03/17 1357  BP: 122/86 128/85  Pulse: (!) 106 (!) 101  Resp: 17 13  Temp: 98.6 F (37 C) 98.4 F (36.9 C)    General:Afebrile, denies chest pain, no shortness of breath, currently still with some abdominal pain but would like to try something by mouth.  Cardiovascular:S1 and S2, no rubs, no gallops, no JVD  Respiratory:Good air movement bilaterally, no wheezing, no crackles  Abdomen:Soft, no guarding, positive tenderness to palpation on her right lower quadrant, but much improved in comparison to admission; no rebound appreciated today, positive bowel sounds.  Musculoskeletal: No edema, no cyanosis, no clubbing.   Discharge Instructions       Discharge Instructions    Discharge instructions    Complete by:  As directed    Stop smoking Take medications as prescribed Please follow up to establish care with PCP as instructed  Follow a low calorie diet Maintain adequate hydration        Current Discharge Medication  List       START taking these medications   Details  amoxicillin-clavulanate (AUGMENTIN) 875-125 MG tablet Take 1 tablet by mouth every 12 (twelve) hours. Qty: 14 tablet, Refills: 0    nicotine  (NICODERM CQ - DOSED IN MG/24 HOURS) 21 mg/24hr patch Place 1 patch (21 mg total) onto the skin daily. Qty: 28 patch, Refills: 0    ondansetron (ZOFRAN-ODT) 8 MG disintegrating tablet Take 1 tablet (8 mg total) by mouth every 8 (eight) hours as needed for nausea or vomiting. Qty: 30 tablet, Refills: 0         CONTINUE these medications which have CHANGED   Details  omeprazole (PRILOSEC) 40 MG capsule Take 1 capsule (40 mg total) by mouth 2 (two) times daily. Qty: 60 capsule, Refills: 1         CONTINUE these medications which have NOT CHANGED   Details  acetaminophen (TYLENOL) 500 MG tablet Take 1,000 mg by mouth 2 (two) times daily as needed for headache.    albuterol (PROVENTIL HFA;VENTOLIN HFA) 108 (90 Base) MCG/ACT inhaler Inhale 2 puffs into the lungs every 6 (six) hours as needed for wheezing. Qty: 1 Inhaler, Refills: 0    fluticasone (FLONASE) 50 MCG/ACT nasal spray Place 1 spray into both nostrils daily as needed for allergies or rhinitis.    gabapentin (NEURONTIN) 400 MG capsule Take 400 mg by mouth 4 (four) times daily.     ibuprofen (ADVIL,MOTRIN) 200 MG tablet Take 400 mg by mouth 2 (two) times daily as needed for headache.    mirtazapine (REMERON) 15 MG tablet Take 15 mg by mouth at bedtime.    QUEtiapine (SEROQUEL) 400 MG tablet Take 400 mg by mouth at bedtime.       No Known Allergies    Follow-up Information    [facility]. Go on 01/18/2017.   Why:  at 1:30pm for a hospital follow up appointment. Please bring all of your medications with you to the clinic.  Contact information: [address] [place] [phone]            The results of significant diagnostics from this hospitalization (including imaging, microbiology, ancillary and laboratory) are listed below for reference.    Significant Diagnostic Studies:  Imaging Results  Dg Chest 2 View  Result  Date: 01/01/2017 CLINICAL DATA:  RIGHT lower quadrant pain and fever upon awakening this morning, shortness of breath, COPD, asthma, hypertension, smoker EXAM: CHEST  2 VIEW COMPARISON:  06/16/2015 FINDINGS: Normal heart size and pulmonary vascularity. Moderate-sized hiatal hernia. LEFT basilar opacity question scarring with mild increased atelectasis or superimposed infiltrate. Remaining lungs clear. No pleural effusion or pneumothorax. Old LEFT rib fractures. Old superior endplate compression fracture of T12. IMPRESSION: Moderate-sized hiatal hernia. Increased LEFT basilar opacity likely representing scarring with mild superimposed atelectasis or infiltrate. Electronically Signed   By: Ulyses SouthwardMark  Boles M.D.   On: 01/01/2017 18:30   Ct Abdomen Pelvis W Contrast  Result Date: 01/01/2017 CLINICAL DATA:  RIGHT lower quadrant abdominal pain and fever since this morning, nausea yesterday, history hypertension, asthma, COPD EXAM: CT ABDOMEN AND PELVIS WITH CONTRAST TECHNIQUE: Multidetector CT imaging of the abdomen and pelvis was performed using the standard protocol following bolus administration of intravenous contrast. Sagittal and coronal MPR images reconstructed from axial data set. CONTRAST:  30mL ISOVUE-300 IOPAMIDOL (ISOVUE-300) INJECTION 61% PO, 100mL ISOVUE-300 IOPAMIDOL (ISOVUE-300) INJECTION 61% IV COMPARISON:  None ; correlation CT chest 06/18/2015 FINDINGS: Lower chest:  LEFT lower lobe infiltrate question pneumonia Hepatobiliary: Diffuse fatty infiltration of liver. Gallbladder unremarkable. No focal hepatic mass lesion or biliary dilatation. Pancreas: Few parenchymal calcifications in pancreas without mass or ductal dilatation Spleen: Normal appearance Adrenals/Urinary Tract: Adrenal glands normal appearance. Kidneys, ureters, and bladder normal appearance Stomach/Bowel: Normal appendix. Stomach and bowel loops normal appearance. Vascular/Lymphatic: Atherosclerotic calcification aorta. Aorta normal  caliber. No adenopathy. Reproductive: Normal appearing uterus and ovaries Other: No free air or free fluid. No hernia or inflammatory process. Musculoskeletal: Chronic superior endplate compression fracture of a lower thoracic vertebra, labeled T12 on prior CT chest unchanged. IMPRESSION: Fatty infiltration of liver. Normal appendix. No acute intra-abdominal or intrapelvic abnormalities. Aortic Atherosclerosis (ICD10-I70.0). Electronically Signed   By: Ulyses Southward M.D.   On: 01/01/2017 19:48     Microbiology:        Recent Results (from the past 240 hour(s))  Culture, blood (x 2)     Status: None (Preliminary result)   Collection Time: 01/02/17 12:41 AM  Result Value Ref Range Status   Specimen Description BLOOD BLOOD RIGHT FOREARM  Final   Special Requests IN PEDIATRIC BOTTLE Blood Culture adequate volume  Final   Culture   Final    NO GROWTH 1 DAY Performed at [facility], [address] [place], [place] [address]    Report Status PENDING  Incomplete  Culture, blood (x 2)     Status: None (Preliminary result)   Collection Time: 01/02/17 12:41 AM  Result Value Ref Range Status   Specimen Description BLOOD RIGHT WRIST  Final   Special Requests IN PEDIATRIC BOTTLE Blood Culture adequate volume  Final   Culture   Final    NO GROWTH 1 DAY Performed at [facility], [address] [place], [place] [address]    Report Status PENDING  Incomplete     Labs: Basic Metabolic Panel:  Last Labs    Recent Labs Lab 01/01/17 1804 01/02/17 0041 01/03/17 0805  NA 132* 139 139  K 3.6 3.1* 3.2*  CL 99* 109 111  CO2 22 21* 21*  GLUCOSE 132* 106* 106*  BUN 5* 6 <5*  CREATININE 0.94 0.69 0.63  CALCIUM 8.3* 7.2* 7.4*  MG  --   --  1.1*     Liver Function Tests:  Last Labs    Recent Labs Lab 01/01/17 1804 01/02/17 0041  AST 99* 65*  ALT 66* 52  ALKPHOS 143* 118  BILITOT 1.2 0.8  PROT 6.6 5.4*  ALBUMIN 3.1* 2.5*      Last Labs     Recent Labs Lab 01/02/17 0041  LIPASE 14     Last Labs   No results for input(s): AMMONIA in the last 168 hours.   CBC:  Last Labs    Recent Labs Lab 01/01/17 1804 01/02/17 0041 01/03/17 0805  WBC 12.3* 9.4 6.6  NEUTROABS 8.9*  --  4.8  HGB 13.4 11.4* 10.4*  HCT 38.3 33.6* 30.7*  MCV 103.2* 103.7* 105.1*  PLT 204 161 136*      CBG:  Last Labs    Recent Labs Lab 01/02/17 0805 01/03/17 0745  GLUCAP 113* 106*      Signed:  Vassie Loll MD.  [facility] 01/03/2017, 3:08 PM

## 2017-01-07 LAB — CULTURE, BLOOD (ROUTINE X 2)
CULTURE: NO GROWTH
Culture: NO GROWTH
SPECIAL REQUESTS: ADEQUATE
SPECIAL REQUESTS: ADEQUATE

## 2017-01-18 ENCOUNTER — Ambulatory Visit (INDEPENDENT_AMBULATORY_CARE_PROVIDER_SITE_OTHER): Payer: Self-pay | Admitting: Physician Assistant

## 2017-01-18 ENCOUNTER — Encounter (INDEPENDENT_AMBULATORY_CARE_PROVIDER_SITE_OTHER): Payer: Self-pay | Admitting: Physician Assistant

## 2017-01-18 VITALS — BP 125/88 | HR 110 | Temp 98.3°F | Ht 64.57 in | Wt 186.0 lb

## 2017-01-18 DIAGNOSIS — G4700 Insomnia, unspecified: Secondary | ICD-10-CM

## 2017-01-18 DIAGNOSIS — R918 Other nonspecific abnormal finding of lung field: Secondary | ICD-10-CM

## 2017-01-18 DIAGNOSIS — R1031 Right lower quadrant pain: Secondary | ICD-10-CM

## 2017-01-18 DIAGNOSIS — D649 Anemia, unspecified: Secondary | ICD-10-CM

## 2017-01-18 DIAGNOSIS — F329 Major depressive disorder, single episode, unspecified: Secondary | ICD-10-CM

## 2017-01-18 DIAGNOSIS — F419 Anxiety disorder, unspecified: Secondary | ICD-10-CM

## 2017-01-18 DIAGNOSIS — R000 Tachycardia, unspecified: Secondary | ICD-10-CM

## 2017-01-18 DIAGNOSIS — R748 Abnormal levels of other serum enzymes: Secondary | ICD-10-CM

## 2017-01-18 DIAGNOSIS — R202 Paresthesia of skin: Secondary | ICD-10-CM

## 2017-01-18 MED ORDER — CLONAZEPAM 1 MG PO TABS: 1.0000 mg | ORAL_TABLET | Freq: Every day | ORAL | 0 refills | Status: DC

## 2017-01-18 NOTE — Patient Instructions (Signed)

## 2017-01-18 NOTE — Progress Notes (Signed)
Subjective:  Patient ID: Catherine ClanKelly D Luhmann, female    DOB: 05/02/1961  Age: 56 y.o. MRN: [id number]  CC: hospital f/u  HPI Catherine Butler is a 56 y.o. female with a PMH of anxiety, depression, insomnia, asthma, COPD, depression, HTN, and acid reflux presents on hospital f/u. She was admitted on 01/01/17 to 01/03/17. Was diagnosed with abdominal pain, sepsis 2/2 presumed enteritis, hypokalemia, abnormal LFTs, and LLL infiltrates (no PNA sxs).  CT abdomen and pelvis negative. Hepatitis panel negative. Blood cx negative.  States she if feeling better in regards to abdominal pain. Only complaint is of tingling and numbness of the arms, legs, and abdomen since leaving the hospital. Denies weakness, paralysis, visual/auditory sxs, GI/GU sxs, nuchal rigidity, HA,  or backpain outside of baseline. Has been having waxing and waning anxiety since leaving the hospital.     Outpatient Medications Prior to Visit  Medication Sig Dispense Refill  . acetaminophen (TYLENOL) 500 MG tablet Take 1,000 mg by mouth 2 (two) times daily as needed for headache.    . albuterol (PROVENTIL HFA;VENTOLIN HFA) 108 (90 Base) MCG/ACT inhaler Inhale 2 puffs into the lungs every 6 (six) hours as needed for wheezing. 1 Inhaler 0  . amoxicillin-clavulanate (AUGMENTIN) 875-125 MG tablet Take 1 tablet by mouth every 12 (twelve) hours. 14 tablet 0  . fluticasone (FLONASE) 50 MCG/ACT nasal spray Place 1 spray into both nostrils daily as needed for allergies or rhinitis.    Marland Kitchen. gabapentin (NEURONTIN) 400 MG capsule Take 400 mg by mouth 4 (four) times daily.     Marland Kitchen. ibuprofen (ADVIL,MOTRIN) 200 MG tablet Take 400 mg by mouth 2 (two) times daily as needed for headache.    . mirtazapine (REMERON) 15 MG tablet Take 15 mg by mouth at bedtime.    . nicotine (NICODERM CQ - DOSED IN MG/24 HOURS) 21 mg/24hr patch Place 1 patch (21 mg total) onto the skin daily. 28 patch 0  . omeprazole (PRILOSEC) 40 MG capsule Take 1 capsule (40 mg total) by mouth 2 (two)  times daily. 60 capsule 1  . ondansetron (ZOFRAN-ODT) 8 MG disintegrating tablet Take 1 tablet (8 mg total) by mouth every 8 (eight) hours as needed for nausea or vomiting. 30 tablet 0  . QUEtiapine (SEROQUEL) 400 MG tablet Take 400 mg by mouth at bedtime.     No facility-administered medications prior to visit.      ROS Review of Systems  Constitutional: Negative for chills, fever and malaise/fatigue.  Eyes: Negative for blurred vision.  Respiratory: Negative for shortness of breath.   Cardiovascular: Negative for chest pain and palpitations.  Gastrointestinal: Positive for abdominal pain (almost resolved). Negative for nausea.  Genitourinary: Negative for dysuria and hematuria.  Musculoskeletal: Positive for back pain. Negative for joint pain and myalgias.  Skin: Negative for rash.  Neurological: Negative for tingling and headaches.  Psychiatric/Behavioral: Positive for depression. The patient is nervous/anxious and has insomnia.     Objective:  LMP 02/22/2006   BP/Weight 01/03/2017 01/01/2017 06/18/2015  Systolic BP 128 - 134  Diastolic BP 85 - 87  Wt. (Lbs) - 196.21 -  BMI - 31.67 -      Physical Exam  Constitutional: She is oriented to person, place, and time.  Well developed, obese, NAD, polite  HENT:  Head: Normocephalic and atraumatic.  Eyes: Conjunctivae are normal. No scleral icterus.  Neck: Normal range of motion. Neck supple. No thyromegaly present.  Cardiovascular: Normal rate, regular rhythm and normal heart sounds.   No  carotid bruit bilaterally  Pulmonary/Chest: Effort normal and breath sounds normal.  Abdominal: Soft. Bowel sounds are normal. There is no tenderness.  Musculoskeletal: She exhibits no edema.  Neurological: She is alert and oriented to person, place, and time. No cranial nerve deficit. Coordination normal.  Skin: Skin is warm and dry. No rash noted. No erythema. No pallor.  Psychiatric: She has a normal mood and affect. Her behavior is  normal. Thought content normal.  Vitals reviewed.    Assessment & Plan:    1. Right lower quadrant abdominal pain - Nearly resolved since discharge from hospital  2. Anemia, unspecified type - Last hospital Hgb 10.4 g/dL - Iron, TIBC and Ferritin Panel - CBC with Differential  3. Elevated liver enzymes - Comprehensive metabolic panel  4. Lung infiltrate - DG Chest 2 View; Future. Advised patient to have CXR done in two weeks. [facility] in.  5. Tachycardia - 110 BPM in clinic today. Etiology unknown at this point. There is a hx of anxiety, insomnia, and anemia that may be contributing - Comprehensive metabolic panel - TSH  6. Insomnia, unspecified type - TSH - Begin clonazePAM (KLONOPIN) 1 MG tablet; Take 1 tablet (1 mg total) by mouth at bedtime.  Dispense: 30 tablet; Refill: 0. I have warned patient on the addictive potential of this drug. I will taper down at the coming appointments.  7. Paresthesia - Atypical presentation affecting all extremities and abdomen. Suspect possible anxiety reaction. - Comprehensive metabolic panel - TSH  8. Anxiety and depression - Begin clonazePAM (KLONOPIN) 1 MG tablet; Take 1 tablet (1 mg total) by mouth at bedtime.  Dispense: 30 tablet; Refill: 0   Meds ordered this encounter  Medications  . clonazePAM (KLONOPIN) 1 MG tablet    Sig: Take 1 tablet (1 mg total) by mouth at bedtime.    Dispense:  30 tablet    Refill:  0    Order Specific Question:   Supervising Provider    Answer:   Quentin AngstJEGEDE, OLUGBEMIGA E [id number]]    Follow-up: Return in about 4 weeks (around 02/15/2017) for full physical.   Loletta Specteroger David Gomez PA

## 2017-01-19 LAB — TSH: TSH: 1.29 u[IU]/mL (ref 0.450–4.500)

## 2017-01-19 LAB — COMPREHENSIVE METABOLIC PANEL
ALT: 59 IU/L — AB (ref 0–32)
AST: 69 IU/L — ABNORMAL HIGH (ref 0–40)
Albumin/Globulin Ratio: 1.2 (ref 1.2–2.2)
Albumin: 3.6 g/dL (ref 3.5–5.5)
Alkaline Phosphatase: 206 IU/L — ABNORMAL HIGH (ref 39–117)
BUN/Creatinine Ratio: 7 — ABNORMAL LOW (ref 9–23)
BUN: 5 mg/dL — AB (ref 6–24)
Bilirubin Total: 0.4 mg/dL (ref 0.0–1.2)
CALCIUM: 8.9 mg/dL (ref 8.7–10.2)
CO2: 22 mmol/L (ref 20–29)
CREATININE: 0.69 mg/dL (ref 0.57–1.00)
Chloride: 98 mmol/L (ref 96–106)
GFR, EST AFRICAN AMERICAN: 114 mL/min/{1.73_m2} (ref >59)
GFR, EST NON AFRICAN AMERICAN: 99 mL/min/{1.73_m2} (ref >59)
GLUCOSE: 108 mg/dL — AB (ref 65–99)
Globulin, Total: 3.1 g/dL (ref 1.5–4.5)
Potassium: 4.1 mmol/L (ref 3.5–5.2)
Sodium: 138 mmol/L (ref 134–144)
TOTAL PROTEIN: 6.7 g/dL (ref 6.0–8.5)

## 2017-01-19 LAB — CBC WITH DIFFERENTIAL/PLATELET
BASOS: 1 %
Basophils Absolute: 0.1 10*3/uL (ref 0.0–0.2)
EOS (ABSOLUTE): 0.1 10*3/uL (ref 0.0–0.4)
Eos: 1 %
HEMOGLOBIN: 13.5 g/dL (ref 11.1–15.9)
Hematocrit: 40.3 % (ref 34.0–46.6)
IMMATURE GRANS (ABS): 0 10*3/uL (ref 0.0–0.1)
IMMATURE GRANULOCYTES: 0 %
Lymphocytes Absolute: 2.6 10*3/uL (ref 0.7–3.1)
Lymphs: 39 %
MCH: 34.8 pg — AB (ref 26.6–33.0)
MCHC: 33.5 g/dL (ref 31.5–35.7)
MCV: 104 fL — AB (ref 79–97)
MONOCYTES: 11 %
Monocytes Absolute: 0.7 10*3/uL (ref 0.1–0.9)
NEUTROS ABS: 3.1 10*3/uL (ref 1.4–7.0)
Neutrophils: 48 %
Platelets: 332 10*3/uL (ref 150–379)
RBC: 3.88 x10E6/uL (ref 3.77–5.28)
RDW: 13.7 % (ref 12.3–15.4)
WBC: 6.5 10*3/uL (ref 3.4–10.8)

## 2017-01-19 LAB — IRON,TIBC AND FERRITIN PANEL
FERRITIN: 60 ng/mL (ref 15–150)
Iron Saturation: 27 % (ref 15–55)
Iron: 92 ug/dL (ref 27–159)
TIBC: 338 ug/dL (ref 250–450)
UIBC: 246 ug/dL (ref 131–425)

## 2017-01-29 ENCOUNTER — Ambulatory Visit
Admission: RE | Admit: 2017-01-29 | Discharge: 2017-01-29 | Disposition: A | Discharge status: Home / Self Care | Payer: Self-pay | Source: Ambulatory Visit | Attending: Physician Assistant | Admitting: Physician Assistant

## 2017-01-29 DIAGNOSIS — R918 Other nonspecific abnormal finding of lung field: Secondary | ICD-10-CM | POA: Insufficient documentation

## 2017-01-31 ENCOUNTER — Other Ambulatory Visit (INDEPENDENT_AMBULATORY_CARE_PROVIDER_SITE_OTHER): Payer: Self-pay | Admitting: Physician Assistant

## 2017-01-31 DIAGNOSIS — R9389 Abnormal findings on diagnostic imaging of other specified body structures: Secondary | ICD-10-CM

## 2017-01-31 MED ORDER — AMOXICILLIN-POT CLAVULANATE 875-125 MG PO TABS: 1.0000 | ORAL_TABLET | Freq: Two times a day (BID) | ORAL | 0 refills | Status: DC

## 2017-01-31 NOTE — Progress Notes (Signed)
Could not rx Levaquin due to pt taking Seroquel which can prolong QT.

## 2017-02-01 ENCOUNTER — Other Ambulatory Visit (INDEPENDENT_AMBULATORY_CARE_PROVIDER_SITE_OTHER): Payer: Self-pay | Admitting: Physician Assistant

## 2017-02-01 ENCOUNTER — Ambulatory Visit (INDEPENDENT_AMBULATORY_CARE_PROVIDER_SITE_OTHER): Payer: Self-pay | Admitting: Physician Assistant

## 2017-02-01 ENCOUNTER — Encounter (INDEPENDENT_AMBULATORY_CARE_PROVIDER_SITE_OTHER): Payer: Self-pay | Admitting: Physician Assistant

## 2017-02-01 VITALS — BP 120/90 | HR 112 | Temp 97.7°F | Wt 185.8 lb

## 2017-02-01 DIAGNOSIS — R748 Abnormal levels of other serum enzymes: Secondary | ICD-10-CM

## 2017-02-01 DIAGNOSIS — J9811 Atelectasis: Secondary | ICD-10-CM

## 2017-02-01 DIAGNOSIS — R9389 Abnormal findings on diagnostic imaging of other specified body structures: Secondary | ICD-10-CM

## 2017-02-01 DIAGNOSIS — R05 Cough: Secondary | ICD-10-CM

## 2017-02-01 DIAGNOSIS — R059 Cough, unspecified: Secondary | ICD-10-CM

## 2017-02-01 MED ORDER — BENZONATATE 100 MG PO CAPS: 200.0000 mg | ORAL_CAPSULE | Freq: Three times a day (TID) | ORAL | 0 refills | Status: DC | PRN

## 2017-02-01 MED ORDER — AMOXICILLIN-POT CLAVULANATE 875-125 MG PO TABS: 1.0000 | ORAL_TABLET | Freq: Two times a day (BID) | ORAL | 0 refills | Status: DC

## 2017-02-01 MED ORDER — SPIROMETER KIT: 1.0000 "application " | PACK | 0 refills | Status: AC

## 2017-02-01 NOTE — Patient Instructions (Signed)
Atelectasis, Adult Atelectasis is a collapse of air sacs in the lungs (alveoli). The condition causes all or part of a lung to collapse. Atelectasis is a common problem after surgery. Its severity depends on the size of lung tissue area involved and the underlying cause. When severe, it can lead to shortness of breath and heart problems. Atelectasis can develop suddenly or over a long period of time. Atelectasis that develops over a long period of time (chronic atelectasis) often leads to infection, scarring, and other problems. What are the causes? This condition may be caused by:  Shallow breathing.  Medicines that make breathing more shallow.  A blockage in an airway. Blockages can result from: ? A buildup of mucus. ? A tumor. ? An inhaled object (foreign body). ? Enlarged lymph nodes. ? Fluid in the lungs (pleural effusion). ? A blood clot in the lungs.  Outside pressure on the lung. Pressure can be due to: ? A tumor. ? Fluid in the lungs (pleural effusion). ? Air leaking between the lung and rib cage (pneumothorax). ? Enlarged lymph nodes.  Improper expansion of the lungs. This may occur in newborns because of: ? Prematurity. ? Low oxygen levels. ? Secretions at birth that block the airway. ? Amniotic fluid that goes into the lungs (aspiration).  What increases the risk? This condition is more likely to develop in people who:  Have an injury or health problem that makes taking deep breaths difficult or painful.  Have certain infections or diseases, such as pneumonia or cystic fibrosis.  Have had surgery on the chest or abdomen.  Have broken ribs.  Have a tight bandage around their chest.  Have a collapsed lung due to pneumothorax.  Take medicines that decrease the rate of their breathing or how deeply they breathe, like sedatives.  Lie flat for long periods of time.  What are the signs or symptoms? Often, there are no symptoms for this condition. When symptoms  do appear, they may include:  Shortness of breath.  Bluish color to the nails, lips, or mouth (cyanosis).  A cough.  How is this diagnosed? This condition may be diagnosed based on:  Symptoms.  A physical exam.  A chest X-ray.  Sometimes specialized imaging tests are needed to diagnose the condition. How is this treated? Treatment for this condition depends on what caused the condition. Treatment may involve:  Coughing. Coughing helps loosen mucus in the airway.  Chest physiotherapy. This is a treatment to help loosen and clear mucus from the airways. It is done by clapping the chest.  Postural drainage techniques. This treatment involves positioning your body so your head is lower than your chest. It helps mucus drain from your airways.  An incentive spirometer. This is a device that is used to help with taking deeper breaths.  Positive pressure breathing. This is a form of breathing assistance in which air is forced into the lungs when you breathe in (inhale). You may have this treatment if your condition is severe.  Treatment of the underlying condition.  Follow these instructions at home:  Take over-the-counter and prescription medicines only as told by your health care provider.  Practice taking relaxed and deep breaths when you are sitting. A good time to practice is when you are watching TV. Take a few deep breaths during each commercial break.  Make sure to lie on your unaffected side when you are lying down. For example, if you have atelectasis in your left lung, lie on your right  side. This will help mucus drain from your airway.  Cough several times a day as told by your health care provider.  Perform chest physiotherapy or postural drainage techniques as told by your health care provider. If necessary, have someone help you.  If you were given a device to help with breathing, use it as told by your health care provider.  Stay as active as possible. Get help  right away if:  Your breathing problems get worse.  You have severe chest pain.  You develop severe coughing.  You cough up blood.  You have a fever.  You have persistent symptoms for more than 2-3 days.  Your symptoms suddenly get worse. This information is not intended to replace advice given to you by your health care provider. Make sure you discuss any questions you have with your health care provider. Document Released: 06/07/2005 Document Revised: 12/26/2015 Document Reviewed: 11/10/2015 Elsevier Interactive Patient Education  [employer]2018 [employer]

## 2017-02-01 NOTE — Progress Notes (Signed)
Subjective:  Patient ID: Catherine Butler, female    DOB: May 12, 1961  Age: 56 y.o. MRN: [id number]  CC:   HPI Catherine Butler is a 56 y.o. female with a PMH of anxiety, asthma, arthritis, COPD, depression, HTN, and acid reflux presents to discuss elevated liver enzymes. Feeling well, no abdominal pain, fever, chills, nausea, vomiting, jaundice, itching. Drinks a beer everyday with her son.     Cough remains. She was found to have atelectasis in the left lung base. PNA could not be excluded. She was prescribed Augmentin but she did not pick up because she wanted abx sent elsewhere. Abx was resent this morning. She would like a cough suppressant. Still smoking a pack a day. No other symptoms or complaints.      Outpatient Medications Prior to Visit  Medication Sig Dispense Refill  . acetaminophen (TYLENOL) 500 MG tablet Take 1,000 mg by mouth 2 (two) times daily as needed for headache.    . albuterol (PROVENTIL HFA;VENTOLIN HFA) 108 (90 Base) MCG/ACT inhaler Inhale 2 puffs into the lungs every 6 (six) hours as needed for wheezing. 1 Inhaler 0  . amoxicillin-clavulanate (AUGMENTIN) 875-125 MG tablet Take 1 tablet by mouth 2 (two) times daily. 20 tablet 0  . clonazePAM (KLONOPIN) 1 MG tablet Take 1 tablet (1 mg total) by mouth at bedtime. 30 tablet 0  . fluticasone (FLONASE) 50 MCG/ACT nasal spray Place 1 spray into both nostrils daily as needed for allergies or rhinitis.    Marland Kitchen gabapentin (NEURONTIN) 400 MG capsule Take 400 mg by mouth 4 (four) times daily.     Marland Kitchen ibuprofen (ADVIL,MOTRIN) 200 MG tablet Take 400 mg by mouth 2 (two) times daily as needed for headache.    . mirtazapine (REMERON) 15 MG tablet Take 15 mg by mouth at bedtime.    Marland Kitchen omeprazole (PRILOSEC) 40 MG capsule Take 1 capsule (40 mg total) by mouth 2 (two) times daily. 60 capsule 1  . ondansetron (ZOFRAN-ODT) 8 MG disintegrating tablet Take 1 tablet (8 mg total) by mouth every 8 (eight) hours as needed for nausea or vomiting. 30 tablet 0   . QUEtiapine (SEROQUEL) 400 MG tablet Take 400 mg by mouth at bedtime.     No facility-administered medications prior to visit.      ROS Review of Systems  Constitutional: Negative for chills, fever and malaise/fatigue.  Eyes: Negative for blurred vision.  Respiratory: Positive for cough. Negative for shortness of breath.   Cardiovascular: Negative for chest pain and palpitations.  Gastrointestinal: Negative for abdominal pain and nausea.  Genitourinary: Negative for dysuria and hematuria.  Musculoskeletal: Negative for joint pain and myalgias.  Skin: Negative for rash.  Neurological: Negative for tingling and headaches.  Psychiatric/Behavioral: Negative for depression. The patient is not nervous/anxious.     Objective:  BP 120/90 (BP Location: Left Arm, Patient Position: Sitting, Cuff Size: Normal)   Pulse (!) 112   Temp 97.7 F (36.5 C) (Oral)   Wt 185 lb 12.8 oz (84.3 kg)   LMP 02/22/2006   SpO2 97%   BMI 31.33 kg/m   BP/Weight 02/01/2017 01/18/2017 7/41/2878  Systolic BP 676 720 947  Diastolic BP 90 88 85  Wt. (Lbs) 185.8 186 -  BMI 31.33 31.37 -      Physical Exam  Constitutional: She is oriented to person, place, and time.  Well developed, obese, NAD, polite  HENT:  Head: Normocephalic and atraumatic.  Eyes: No scleral icterus.  Cardiovascular: Normal rate, regular rhythm  and normal heart sounds.   Pulmonary/Chest: Effort normal and breath sounds normal. No respiratory distress. She has no wheezes. She has no rales.  Abdominal: Soft. Bowel sounds are normal.  Unable to assess for hepatomegaly due to abdominal obesity. Mild pain elicited on palpation of the RUQ.  Musculoskeletal: She exhibits no edema.  Neurological: She is alert and oriented to person, place, and time. No cranial nerve deficit. Coordination normal.  Skin: Skin is warm and dry. No rash noted. No erythema. No pallor.  Psychiatric: She has a normal mood and affect. Her behavior is normal.  Thought content normal.  Vitals reviewed.    Assessment & Plan:   1. Cough - Begin benzonatate (TESSALON PERLES) 100 MG capsule; Take 2 capsules (200 mg total) by mouth 3 (three) times daily as needed for cough.  Dispense: 42 capsule; Refill: 0  2. Elevated liver enzymes - Lipid Panel - Hepatitis panel, acute - US Abdomen Limited RUQ; Future  3. Atelectasis - Begin Respiratory Therapy Supplies (SPIROMETER) KIT; 1 application by Does not apply route every hour.  Dispense: 1 kit; Refill: 0   Meds ordered this encounter  Medications  . benzonatate (TESSALON PERLES) 100 MG capsule    Sig: Take 2 capsules (200 mg total) by mouth 3 (three) times daily as needed for cough.    Dispense:  42 capsule    Refill:  0    Order Specific Question:   Supervising Provider    Answer:   Tresa Garter [id number]  . Respiratory Therapy Supplies (SPIROMETER) KIT    Sig: 1 application by Does not apply route every hour.    Dispense:  1 kit    Refill:  0    Order Specific Question:   Supervising Provider    Answer:   Tresa Garter [id number]    Follow-up: Return for keep previous f/u appt.   Clent Demark PA

## 2017-02-01 NOTE — Progress Notes (Signed)
Pt would like something for cough, states she coughs all night long

## 2017-02-02 ENCOUNTER — Other Ambulatory Visit (INDEPENDENT_AMBULATORY_CARE_PROVIDER_SITE_OTHER): Payer: Self-pay | Admitting: Physician Assistant

## 2017-02-02 DIAGNOSIS — E785 Hyperlipidemia, unspecified: Secondary | ICD-10-CM

## 2017-02-02 LAB — HEPATITIS PANEL, ACUTE
HEP A IGM: NEGATIVE
Hep B C IgM: NEGATIVE
Hepatitis B Surface Ag: NEGATIVE

## 2017-02-02 LAB — LIPID PANEL
Chol/HDL Ratio: 6.4 ratio — ABNORMAL HIGH (ref 0.0–4.4)
Cholesterol, Total: 277 mg/dL — ABNORMAL HIGH (ref 100–199)
HDL: 43 mg/dL (ref >39)
LDL Calculated: 155 mg/dL — ABNORMAL HIGH (ref 0–99)
Triglycerides: 397 mg/dL — ABNORMAL HIGH (ref 0–149)
VLDL CHOLESTEROL CAL: 79 mg/dL — AB (ref 5–40)

## 2017-02-02 MED ORDER — ATORVASTATIN CALCIUM 40 MG PO TABS
40.0000 mg | ORAL_TABLET | Freq: Every day | ORAL | 3 refills | Status: DC
Start: 2017-02-02 — End: 2017-02-07

## 2017-02-07 ENCOUNTER — Ambulatory Visit (HOSPITAL_COMMUNITY)
Admission: RE | Admit: 2017-02-07 | Discharge: 2017-02-07 | Disposition: A | Discharge status: Home / Self Care | Payer: Self-pay | Source: Ambulatory Visit | Attending: Physician Assistant | Admitting: Physician Assistant

## 2017-02-07 ENCOUNTER — Telehealth (INDEPENDENT_AMBULATORY_CARE_PROVIDER_SITE_OTHER): Payer: Self-pay | Admitting: *Deleted

## 2017-02-07 DIAGNOSIS — R748 Abnormal levels of other serum enzymes: Secondary | ICD-10-CM | POA: Insufficient documentation

## 2017-02-07 MED ORDER — ATORVASTATIN CALCIUM 40 MG PO TABS
40.0000 mg | ORAL_TABLET | Freq: Every day | ORAL | 3 refills | Status: DC
Start: 2017-02-07 — End: 2017-06-20

## 2017-02-07 NOTE — Telephone Encounter (Signed)
-----   Message from Loletta Specter, PA-C sent at 02/07/2017  1:39 PM EDT ----- RUQ US shows fatty liver. Normal gallbladder and common bile duct.

## 2017-02-07 NOTE — Telephone Encounter (Signed)
Sorry, Clonazepam can not be prescribed anymore for chronic issues due to policy.

## 2017-02-07 NOTE — Telephone Encounter (Signed)
Patient verified DOB  Patient is aware of liver being fatty and gallbladder and bile duct being normal. Patient expressed her understanding.

## 2017-02-15 ENCOUNTER — Telehealth (HOSPITAL_COMMUNITY): Payer: Self-pay

## 2017-02-15 ENCOUNTER — Encounter (INDEPENDENT_AMBULATORY_CARE_PROVIDER_SITE_OTHER): Payer: Self-pay | Admitting: Physician Assistant

## 2017-02-15 ENCOUNTER — Ambulatory Visit (INDEPENDENT_AMBULATORY_CARE_PROVIDER_SITE_OTHER): Payer: Self-pay | Admitting: Physician Assistant

## 2017-02-15 VITALS — BP 113/84 | HR 118 | Temp 97.5°F | Wt 186.8 lb

## 2017-02-15 DIAGNOSIS — F32A Depression, unspecified: Secondary | ICD-10-CM

## 2017-02-15 DIAGNOSIS — M79605 Pain in left leg: Secondary | ICD-10-CM

## 2017-02-15 DIAGNOSIS — M79604 Pain in right leg: Secondary | ICD-10-CM

## 2017-02-15 DIAGNOSIS — F329 Major depressive disorder, single episode, unspecified: Secondary | ICD-10-CM

## 2017-02-15 DIAGNOSIS — E785 Hyperlipidemia, unspecified: Secondary | ICD-10-CM

## 2017-02-15 DIAGNOSIS — F17200 Nicotine dependence, unspecified, uncomplicated: Secondary | ICD-10-CM

## 2017-02-15 NOTE — Patient Instructions (Addendum)
Peripheral Vascular Disease Peripheral vascular disease (PVD) is a disease of the blood vessels that are not part of your heart and brain. A simple term for PVD is poor circulation. In most cases, PVD narrows the blood vessels that carry blood from your heart to the rest of your body. This can result in a decreased supply of blood to your arms, legs, and internal organs, like your stomach or kidneys. However, it most often affects a person's lower legs and feet. There are two types of PVD.  Organic PVD. This is the more common type. It is caused by damage to the structure of blood vessels.  Functional PVD. This is caused by conditions that make blood vessels contract and tighten (spasm).  Without treatment, PVD tends to get worse over time. PVD can also lead to acute ischemic limb. This is when an arm or limb suddenly has trouble getting enough blood. This is a medical emergency. What are the causes? Each type of PVD has many different causes. The most common cause of PVD is buildup of a fatty material (plaque) inside of your arteries (atherosclerosis). Small amounts of plaque can break off from the walls of the blood vessels and become lodged in a smaller artery. This blocks blood flow and can cause acute ischemic limb. Other common causes of PVD include:  Blood clots that form inside of blood vessels.  Injuries to blood vessels.  Diseases that cause inflammation of blood vessels or cause blood vessel spasms.  Health behaviors and health history that increase your risk of developing PVD.  What increases the risk? You may have a greater risk of PVD if you:  Have a family history of PVD.  Have certain medical conditions, including: ? High cholesterol. ? Diabetes. ? High blood pressure (hypertension). ? Coronary heart disease. ? Past problems with blood clots. ? Past injury, such as burns or a broken bone. These may have damaged blood vessels in your limbs. ? Buerger disease. This is  caused by inflamed blood vessels in your hands and feet. ? Some forms of arthritis. ? Rare birth defects that affect the arteries in your legs.  Use tobacco.  Do not get enough exercise.  Are obese.  Are age 50 or older.  What are the signs or symptoms? PVD may cause many different symptoms. Your symptoms depend on what part of your body is not getting enough blood. Some common signs and symptoms include:  Cramps in your lower legs. This may be a symptom of poor leg circulation (claudication).  Pain and weakness in your legs while you are physically active that goes away when you rest (intermittent claudication).  Leg pain when at rest.  Leg numbness, tingling, or weakness.  Coldness in a leg or foot, especially when compared with the other leg.  Skin or hair changes. These can include: ? Hair loss. ? Shiny skin. ? Pale or bluish skin. ? Thick toenails.  Inability to get or maintain an erection (erectile dysfunction).  People with PVD are more prone to developing ulcers and sores on their toes, feet, or legs. These may take longer than normal to heal. How is this diagnosed? Your health care provider may diagnose PVD from your signs and symptoms. The health care provider will also do a physical exam. You may have tests to find out what is causing your PVD and determine its severity. Tests may include:  Blood pressure recordings from your arms and legs and measurements of the strength of your pulses (  pulse volume recordings).  Imaging studies using sound waves to take pictures of the blood flow through your blood vessels (Doppler ultrasound).  Injecting a dye into your blood vessels before having imaging studies using: ? X-rays (angiogram or arteriogram). ? Computer-generated X-rays (CT angiogram). ? A powerful electromagnetic field and a computer (magnetic resonance angiogram or MRA).  How is this treated? Treatment for PVD depends on the cause of your condition and the  severity of your symptoms. It also depends on your age. Underlying causes need to be treated and controlled. These include long-lasting (chronic) conditions, such as diabetes, high cholesterol, and high blood pressure. You may need to first try making lifestyle changes and taking medicines. Surgery may be needed if these do not work. Lifestyle changes may include:  Quitting smoking.  Exercising regularly.  Following a low-fat, low-cholesterol diet.  Medicines may include:  Blood thinners to prevent blood clots.  Medicines to improve blood flow.  Medicines to improve your blood cholesterol levels.  Surgical procedures may include:  A procedure that uses an inflated balloon to open a blocked artery and improve blood flow (angioplasty).  A procedure to put in a tube (stent) to keep a blocked artery open (stent implant).  Surgery to reroute blood flow around a blocked artery (peripheral bypass surgery).  Surgery to remove dead tissue from an infected wound on the affected limb.  Amputation. This is surgical removal of the affected limb. This may be necessary in cases of acute ischemic limb that are not improved through medical or surgical treatments.  Follow these instructions at home:  Take medicines only as directed by your health care provider.  Do not use any tobacco products, including cigarettes, chewing tobacco, or electronic cigarettes. If you need help quitting, ask your health care provider.  Lose weight if you are overweight, and maintain a healthy weight as directed by your health care provider.  Eat a diet that is low in fat and cholesterol. If you need help, ask your health care provider.  Exercise regularly. Ask your health care provider to suggest some good activities for you.  Use compression stockings or other mechanical devices as directed by your health care provider.  Take good care of your feet. ? Wear comfortable shoes that fit well. ? Check your feet  often for any cuts or sores. Contact a health care provider if:  You have cramps in your legs while walking.  You have leg pain when you are at rest.  You have coldness in a leg or foot.  Your skin changes.  You have erectile dysfunction.  You have cuts or sores on your feet that are not healing. Get help right away if:  Your arm or leg turns cold and blue.  Your arms or legs become red, warm, swollen, painful, or numb.  You have chest pain or trouble breathing.  You suddenly have weakness in your face, arm, or leg.  You become very confused or lose the ability to speak.  You suddenly have a very bad headache or lose your vision. This information is not intended to replace advice given to you by your health care provider. Make sure you discuss any questions you have with your health care provider. Document Released: 07/15/2004 Document Revised: 11/13/2015 Document Reviewed: 11/15/2013 Elsevier Interactive Patient Education  2017 [employer]  

## 2017-02-15 NOTE — Progress Notes (Signed)
Subjective:  Patient ID: Catherine Butler, female    DOB: [birth year]  Age: 56 y.o. MRN: [id number]  CC: bilateral leg pain  HPI DHANVI BOESEN is a 56 y.o. female with a PMH of anxiety, asthma, arthritis, COPD, depression, HTN, and acid reflux presents with bilateral leg pain from the knee down. Pain described as aching. Worse with walking, feels cramps, and varicose veins "pop up".  Cramping sensation not alleviated with sitting/rest.  Smokes a pack a day which is less than she smoked before. Smoking for approximately 40 years. Denies trauma, CP, palpitations, SOB, HA, abdominal pain, f/c/n/v, rash, or GI/GU sxs.   Outpatient Medications Prior to Visit  Medication Sig Dispense Refill  . atorvastatin (LIPITOR) 40 MG tablet Take 1 tablet (40 mg total) by mouth daily. 90 tablet 3  . benzonatate (TESSALON PERLES) 100 MG capsule Take 2 capsules (200 mg total) by mouth 3 (three) times daily as needed for cough. 42 capsule 0  . gabapentin (NEURONTIN) 400 MG capsule Take 400 mg by mouth 4 (four) times daily.     Marland Kitchen ibuprofen (ADVIL,MOTRIN) 200 MG tablet Take 400 mg by mouth 2 (two) times daily as needed for headache.    . mirtazapine (REMERON) 15 MG tablet Take 15 mg by mouth at bedtime.    Marland Kitchen omeprazole (PRILOSEC) 40 MG capsule Take 1 capsule (40 mg total) by mouth 2 (two) times daily. 60 capsule 1  . ondansetron (ZOFRAN-ODT) 8 MG disintegrating tablet Take 1 tablet (8 mg total) by mouth every 8 (eight) hours as needed for nausea or vomiting. 30 tablet 0  . QUEtiapine (SEROQUEL) 400 MG tablet Take 400 mg by mouth at bedtime.    Marland Kitchen acetaminophen (TYLENOL) 500 MG tablet Take 1,000 mg by mouth 2 (two) times daily as needed for headache.    . albuterol (PROVENTIL HFA;VENTOLIN HFA) 108 (90 Base) MCG/ACT inhaler Inhale 2 puffs into the lungs every 6 (six) hours as needed for wheezing. 1 Inhaler 0  . amoxicillin-clavulanate (AUGMENTIN) 875-125 MG tablet Take 1 tablet by mouth 2 (two) times daily. 20 tablet 0  .  clonazePAM (KLONOPIN) 1 MG tablet Take 1 tablet (1 mg total) by mouth at bedtime. (Patient not taking: Reported on 02/15/2017) 30 tablet 0  . fluticasone (FLONASE) 50 MCG/ACT nasal spray Place 1 spray into both nostrils daily as needed for allergies or rhinitis.    Marland Kitchen Respiratory Therapy Supplies (SPIROMETER) KIT 1 application by Does not apply route every hour. (Patient not taking: Reported on 02/15/2017) 1 kit 0   No facility-administered medications prior to visit.      ROS Review of Systems  Constitutional: Negative for chills, fever and malaise/fatigue.  Eyes: Negative for blurred vision.  Respiratory: Negative for shortness of breath.   Cardiovascular: Negative for chest pain and palpitations.  Gastrointestinal: Negative for abdominal pain and nausea.  Genitourinary: Negative for dysuria and hematuria.  Musculoskeletal: Negative for joint pain and myalgias.       Bilateral LE pain  Skin: Negative for rash.  Neurological: Negative for tingling and headaches.  Psychiatric/Behavioral: Negative for depression. The patient is not nervous/anxious.     Objective:  BP 113/84 (BP Location: Left Arm, Patient Position: Sitting, Cuff Size: Normal)   Pulse (!) 118   Temp (!) 97.5 F (36.4 C) (Oral)   Wt 186 lb 12.8 oz (84.7 kg)   LMP 02/22/2006   SpO2 93%   BMI 31.50 kg/m   BP/Weight 02/15/2017 02/01/2017 02/06/5908  Systolic BP 311  563 893  Diastolic BP 84 90 88  Wt. (Lbs) 186.8 185.8 186  BMI 31.5 31.33 31.37      Physical Exam  Constitutional: She is oriented to person, place, and time.  Well developed, obese, NAD, polite  HENT:  Head: Normocephalic and atraumatic.  Eyes: No scleral icterus.  Neck: Normal range of motion. Neck supple. No thyromegaly present.  Cardiovascular: Normal rate, regular rhythm and normal heart sounds.   PT 1+ of the LE bilaterally  Pulmonary/Chest: Effort normal and breath sounds normal.  Musculoskeletal:  No edema, deformity, or tenderness of the  LE bilaterally. Small varicosities noted bilaterally.  Neurological: She is alert and oriented to person, place, and time. No cranial nerve deficit. Coordination normal.  Skin: Skin is warm and dry. No rash noted. No erythema. No pallor.  Somewhat mottled appearance of the skin of LE bilaterally  Psychiatric: She has a normal mood and affect. Her behavior is normal. Thought content normal.  Vitals reviewed.    Assessment & Plan:   1. Bilateral leg pain - Suspected early PVD - Ambulatory referral to Vascular Surgery for ABI  2. Tobacco use disorder - Likely contributing to leg pain  3. Hyperlipidemia, unspecified hyperlipidemia type - Continue on atorvastatin  4. Depression, unspecified depression type -- Ambulatory referral to Psychiatry   Follow-up: Return in about 4 weeks (around 03/15/2017) for Bilateral leg pain, smoking.   Clent Demark PA

## 2017-02-22 ENCOUNTER — Other Ambulatory Visit: Payer: Self-pay

## 2017-02-22 DIAGNOSIS — M79605 Pain in left leg: Principal | ICD-10-CM

## 2017-02-22 DIAGNOSIS — M79604 Pain in right leg: Secondary | ICD-10-CM

## 2017-03-26 ENCOUNTER — Encounter (HOSPITAL_COMMUNITY): Payer: Self-pay | Admitting: Nurse Practitioner

## 2017-03-26 ENCOUNTER — Emergency Department (HOSPITAL_COMMUNITY)
Admission: EM | Admit: 2017-03-26 | Discharge: 2017-03-26 | Disposition: A | Discharge status: Home / Self Care | Payer: Medicaid Other | Attending: Emergency Medicine | Admitting: Emergency Medicine

## 2017-03-26 ENCOUNTER — Emergency Department (HOSPITAL_COMMUNITY): Payer: Medicaid Other

## 2017-03-26 DIAGNOSIS — R945 Abnormal results of liver function studies: Secondary | ICD-10-CM | POA: Insufficient documentation

## 2017-03-26 DIAGNOSIS — F17210 Nicotine dependence, cigarettes, uncomplicated: Secondary | ICD-10-CM | POA: Insufficient documentation

## 2017-03-26 DIAGNOSIS — K5900 Constipation, unspecified: Secondary | ICD-10-CM | POA: Insufficient documentation

## 2017-03-26 DIAGNOSIS — R7989 Other specified abnormal findings of blood chemistry: Secondary | ICD-10-CM

## 2017-03-26 DIAGNOSIS — J449 Chronic obstructive pulmonary disease, unspecified: Secondary | ICD-10-CM | POA: Diagnosis not present

## 2017-03-26 LAB — COMPREHENSIVE METABOLIC PANEL
ALT: 61 U/L — ABNORMAL HIGH (ref 14–54)
ANION GAP: 14 (ref 5–15)
AST: 140 U/L — AB (ref 15–41)
Albumin: 2.8 g/dL — ABNORMAL LOW (ref 3.5–5.0)
Alkaline Phosphatase: 250 U/L — ABNORMAL HIGH (ref 38–126)
BUN: 6 mg/dL (ref 6–20)
CHLORIDE: 97 mmol/L — AB (ref 101–111)
CO2: 25 mmol/L (ref 22–32)
Calcium: 8.7 mg/dL — ABNORMAL LOW (ref 8.9–10.3)
Creatinine, Ser: 0.84 mg/dL (ref 0.44–1.00)
GFR calc Af Amer: >60 mL/min (ref >60)
Glucose, Bld: 121 mg/dL — ABNORMAL HIGH (ref 65–99)
POTASSIUM: 4.3 mmol/L (ref 3.5–5.1)
Sodium: 136 mmol/L (ref 135–145)
Total Bilirubin: 1.6 mg/dL — ABNORMAL HIGH (ref 0.3–1.2)
Total Protein: 6.5 g/dL (ref 6.5–8.1)

## 2017-03-26 LAB — URINALYSIS, ROUTINE W REFLEX MICROSCOPIC
Bilirubin Urine: NEGATIVE
GLUCOSE, UA: NEGATIVE mg/dL
Hgb urine dipstick: NEGATIVE
Ketones, ur: NEGATIVE mg/dL
LEUKOCYTES UA: NEGATIVE
Nitrite: NEGATIVE
PH: 7 (ref 5.0–8.0)
Protein, ur: NEGATIVE mg/dL
Specific Gravity, Urine: 1.029 (ref 1.005–1.030)

## 2017-03-26 LAB — CBC
HEMATOCRIT: 38.8 % (ref 36.0–46.0)
Hemoglobin: 13.2 g/dL (ref 12.0–15.0)
MCH: 36.3 pg — ABNORMAL HIGH (ref 26.0–34.0)
MCHC: 34 g/dL (ref 30.0–36.0)
MCV: 106.6 fL — AB (ref 78.0–100.0)
Platelets: 126 10*3/uL — ABNORMAL LOW (ref 150–400)
RBC: 3.64 MIL/uL — AB (ref 3.87–5.11)
RDW: 14.8 % (ref 11.5–15.5)
WBC: 5 10*3/uL (ref 4.0–10.5)

## 2017-03-26 LAB — LIPASE, BLOOD: LIPASE: 23 U/L (ref 11–51)

## 2017-03-26 MED ORDER — IOPAMIDOL (ISOVUE-300) INJECTION 61%
INTRAVENOUS | Status: AC
  Administered 2017-03-26: 100 mL via INTRAVENOUS
  Filled 2017-03-26: qty 100

## 2017-03-26 MED ORDER — MORPHINE SULFATE (PF) 4 MG/ML IV SOLN
4.0000 mg | Freq: Once | INTRAVENOUS | Status: AC
  Administered 2017-03-26: 4 mg via INTRAVENOUS
  Filled 2017-03-26: qty 1

## 2017-03-26 MED ORDER — POLYETHYLENE GLYCOL 3350 17 G PO PACK: 17.0000 g | PACK | Freq: Two times a day (BID) | ORAL | 0 refills | Status: DC

## 2017-03-26 MED ORDER — OXYCODONE-ACETAMINOPHEN 5-325 MG PO TABS
1.0000 | ORAL_TABLET | Freq: Once | ORAL | Status: AC
  Administered 2017-03-26: 1 via ORAL
  Filled 2017-03-26: qty 1

## 2017-03-26 NOTE — ED Notes (Signed)
Pt had drawn for labs:  Gold Blue Lavender Lt green Dark green x2 

## 2017-03-26 NOTE — Discharge Instructions (Signed)
Please read attached information. If you experience any new or worsening signs or symptoms please return to the emergency room for evaluation. Please follow-up with your primary care provider or specialist as discussed. Please use medication prescribed only as directed and discontinue taking if you have any concerning signs or symptoms.   °

## 2017-03-26 NOTE — ED Provider Notes (Signed)
[facility]-EMERGENCY DEPT Provider Note   CSN: [id number] Arrival date & time: 03/26/17  1619     History   Chief Complaint Chief Complaint  Patient presents with  . Abdominal Pain  . Constipation    HPI Catherine Butler is a 56 y.o. female.  HPI   56 year old female presents today with complaints of constipation and abdominal pain.  Patient reports for the last 5 weeks she has not had a bowel movement.  She notes pain in her right lower quadrant sharp in nature coming and going.  Patient notes she continues to pass gas, but no bowel movement.  She reports nausea vomiting, little p.o. intake recently.  Denies any fever.  Patient reports a history of the same.  Patient notes using laxatives, stool softeners, enema without significant improvement in symptoms.    Past Medical History:  Diagnosis Date  . Anxiety   . Arthritis   . Asthma   . COPD (chronic obstructive pulmonary disease) (HCC)   . Depression   . Hypertension   . Reflux     Patient Active Problem List   Diagnosis Date Noted  . Abdominal pain 01/01/2017  . Sepsis (HCC) 01/01/2017  . Tobacco abuse 01/01/2017  . Abnormal LFTs 01/01/2017  . Depression   . COPD (chronic obstructive pulmonary disease) (HCC) 06/16/2015    Past Surgical History:  Procedure Laterality Date  . LAPAROSCOPY    . laporscopy surgery for ovarian cyst  20 years ago  . TONSILLECTOMY      OB History    Gravida Para Term Preterm AB Living   SAB TAB Ectopic Multiple Live Births   1               Home Medications    Prior to Admission medications   Medication Sig Start Date End Date Taking? Authorizing Provider  acetaminophen (TYLENOL) 500 MG tablet Take 1,000 mg by mouth 2 (two) times daily as needed for headache.    [provider]  albuterol (PROVENTIL HFA;VENTOLIN HFA) 108 (90 Base) MCG/ACT inhaler Inhale 2 puffs into the lungs every 6 (six) hours as needed for wheezing. 06/18/15   Standley Brooking, MD    amoxicillin-clavulanate (AUGMENTIN) 875-125 MG tablet Take 1 tablet by mouth 2 (two) times daily. 02/01/17   Loletta Specter, PA-C  atorvastatin (LIPITOR) 40 MG tablet Take 1 tablet (40 mg total) by mouth daily. 02/07/17   Loletta Specter, PA-C  benzonatate (TESSALON PERLES) 100 MG capsule Take 2 capsules (200 mg total) by mouth 3 (three) times daily as needed for cough. 02/01/17   Loletta Specter, PA-C  fluticasone [facility]) 50 MCG/ACT nasal spray Place 1 spray into both nostrils daily as needed for allergies or rhinitis.    [provider]  gabapentin (NEURONTIN) 400 MG capsule Take 400 mg by mouth 4 (four) times daily.     [provider]  ibuprofen (ADVIL,MOTRIN) 200 MG tablet Take 400 mg by mouth 2 (two) times daily as needed for headache.    [provider]  mirtazapine (REMERON) 15 MG tablet Take 15 mg by mouth at bedtime.    [provider]  omeprazole (PRILOSEC) 40 MG capsule Take 1 capsule (40 mg total) by mouth 2 (two) times daily. 01/03/17   Vassie Loll, MD  ondansetron (ZOFRAN-ODT) 8 MG disintegrating tablet Take 1 tablet (8 mg total) by mouth every 8 (eight) hours as needed for nausea or vomiting. 01/03/17  Vassie Loll, MD  polyethylene glycol [facility]) packet Take 17 g by mouth 2 (two) times daily. 03/26/17   Ilyaas Musto, Tinnie Gens, PA-C  QUEtiapine (SEROQUEL) 400 MG tablet Take 400 mg by mouth at bedtime.    [provider]    Family History Family History  Problem Relation Age of Onset  . Diabetes Father   . Hypertension Father 50       heart attack  . Breast cancer Mother 30       lump removed, bile duct cancer  . Hypertension Brother   . Allergic rhinitis Paternal Grandfather     Social History Social History  Substance Use Topics  . Smoking status: Current Every Day Smoker    Packs/day: 1.00    Years: 30.00    Types: Cigarettes  . Smokeless tobacco: Never Used     Comment: states "might be interested in the smoking  cessation program"  Pt given counseling related to smoking cessation  . Alcohol use Yes     Comment: couple x a week     Allergies   Patient has no known allergies.   Review of Systems Review of Systems  All other systems reviewed and are negative.    Physical Exam Updated Vital Signs BP 119/85   Pulse (!) 106   Temp 98.1 F (36.7 C) (Oral)   Resp 16   LMP 02/22/2006   SpO2 96%   Physical Exam  Constitutional: She is oriented to person, place, and time. She appears well-developed and well-nourished.  HENT:  Head: Normocephalic and atraumatic.  Eyes: Pupils are equal, round, and reactive to light. Conjunctivae are normal. Right eye exhibits no discharge. Left eye exhibits no discharge. No scleral icterus.  Neck: Normal range of motion. No JVD present. No tracheal deviation present.  Pulmonary/Chest: Effort normal. No stridor.  Abdominal: Soft. Bowel sounds are normal. She exhibits distension. She exhibits no mass. There is tenderness. There is no rebound and no guarding. No hernia.  TTP or RLQ/LLL, and LUQ  Neurological: She is alert and oriented to person, place, and time. Coordination normal.  Psychiatric: She has a normal mood and affect. Her behavior is normal. Judgment and thought content normal.  Nursing note and vitals reviewed.   ED Treatments / Results  Labs (all labs ordered are listed, but only abnormal results are displayed) Labs Reviewed  COMPREHENSIVE METABOLIC PANEL - Abnormal; Notable for the following:       Result Value   Chloride 97 (*)    Glucose, Bld 121 (*)    Calcium 8.7 (*)    Albumin 2.8 (*)    AST 140 (*)    ALT 61 (*)    Alkaline Phosphatase 250 (*)    Total Bilirubin 1.6 (*)    All other components within normal limits  CBC - Abnormal; Notable for the following:    RBC 3.64 (*)    MCV 106.6 (*)    MCH 36.3 (*)    Platelets 126 (*)    All other components within normal limits  LIPASE, BLOOD  URINALYSIS, ROUTINE W REFLEX  MICROSCOPIC    EKG  EKG Interpretation None       Radiology Ct Abdomen Pelvis W Contrast  Result Date: 03/26/2017 CLINICAL DATA:  RIGHT lower quadrant abdominal pain, constipation, last bowel movement 5 weeks ago, nausea, vomiting, hypertension, asthma, COPD, smoker EXAM: CT ABDOMEN AND PELVIS WITH CONTRAST TECHNIQUE: Multidetector CT imaging of the abdomen and pelvis was performed using the standard protocol following  bolus administration of intravenous contrast. CONTRAST:  <See Chart> ISOVUE-300 IOPAMIDOL (ISOVUE-300) INJECTION 61% COMPARISON:  01/01/2017 FINDINGS: Lower chest: Subsegmental atelectasis LEFT lower lobe, improved. Minimal dependent atelectasis RIGHT lower lobe. Hepatobiliary: Diffuse fatty infiltration of liver. Gallbladder unremarkable. No discrete hepatic mass. 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 No obvious pancreatic mass or ductal dilatation. Spleen: Normal appearance Adrenals/Urinary Tract: Adrenal glands, kidneys, ureters, and bladder normal appearance Stomach/Bowel: Normal appendix. Increased stool throughout colon. Moderate-sized hiatal hernia. Stomach and bowel loops otherwise unremarkable. Vascular/Lymphatic: Atherosclerotic calcification aorta without aneurysm. No adenopathy. Reproductive: Atrophic uterus with unremarkable adnexa Other: No free air or free fluid. No hernia or acute inflammatory process. Musculoskeletal: Chronic superior endplate compression fracture of T11. No acute osseous findings. IMPRESSION: Marked fatty infiltration of liver. Scattered parenchymal calcifications within pancreas question chronic calcific pancreatitis. Increased stool in colon consistent with history of constipation. Moderate-sized hiatal hernia. Aortic Atherosclerosis (ICD10-I70.0). Electronically Signed   By: Ulyses Southward M.D.   On: 03/26/2017 19:30    Procedures Procedures (including critical care time)  Medications Ordered in  ED Medications  oxyCODONE-acetaminophen (PERCOCET/ROXICET) 5-325 MG per tablet 1 tablet (not administered)  morphine 4 MG/ML injection 4 mg (4 mg Intravenous Given 03/26/17 1722)  iopamidol (ISOVUE-300) 61 % injection (100 mLs Intravenous Contrast Given 03/26/17 1910)     Initial Impression / Assessment and Plan / ED Course  I have reviewed the triage vital signs and the nursing notes.  Pertinent labs & imaging results that were available during my care of the patient were reviewed by me and considered in my medical decision making (see chart for details).      Final Clinical Impressions(s) / ED Diagnoses   Final diagnoses:  Constipation, unspecified constipation type  Elevated liver function tests   Labs: cbc,cmp, lipase   Imaging: ct abd pelvis w contrast  Consults:  Therapeutics: percocet   Discharge Meds:   Assessment/Plan: 56 year old female presents today with complaints of abdominal pain and constipation.  Her CT scan here today is consistent with constipation.  No signs of fecal impaction that would be amenable to manual disimpaction.  She will be instructed to use MiraLAX for this, follow-up with her primary care.  Again patient was noted to have elevation in her LFTs slightly elevated over her presentation in August of this year.  At that time she was admitted to the hospital, her LFTs improved with fluid hydration, she had hepatitis panel which was negative, right upper quadrant ultrasound showing no stones or obstructive pathology.  I have very low suspicion for obstruction here today, no TTP of RUQ,  patient will follow-up as an outpatient with her primary care provider for repeat evaluation of elevation in her liver function tests.  Patient is well-appearing in no acute distress discharged with strict return precautions.  No further questions or concerns at time of discharge.    New Prescriptions New Prescriptions   POLYETHYLENE GLYCOL (MIRALAX) PACKET    Take 17 g  by mouth 2 (two) times daily.     Eyvonne Mechanic, PA-C 03/26/17 2036    Tegeler, Canary Brim, MD 03/26/17 2238

## 2017-03-26 NOTE — ED Notes (Signed)
Lab contacted due to still waiting for results. Lab tech states they were not aware of ordered. This RN requested to have labs ran. PA notified.

## 2017-03-26 NOTE — ED Triage Notes (Signed)
Pt is c/o RLQabdominal pain that she is attributing to constipation. States last BM was 5 weeks ago. Typically has a BM every week. Adds that she has been also been having N/V, denying urinary symptoms.

## 2017-03-26 NOTE — ED Notes (Signed)
Patient transported to CT 

## 2017-03-26 NOTE — ED Notes (Signed)
Pain medication given in discahrge. Patient advised about side effects of medications and  to avoid driving for a minimum of 4 hours. Patient reports friend driving her home.

## 2017-03-30 ENCOUNTER — Inpatient Hospital Stay (HOSPITAL_COMMUNITY): Admission: RE | Admit: 2017-03-30 | Payer: Self-pay | Source: Ambulatory Visit

## 2017-03-30 ENCOUNTER — Encounter: Payer: Self-pay | Admitting: Vascular Surgery

## 2017-04-05 ENCOUNTER — Encounter (INDEPENDENT_AMBULATORY_CARE_PROVIDER_SITE_OTHER): Payer: Self-pay | Admitting: Physician Assistant

## 2017-04-05 ENCOUNTER — Ambulatory Visit (INDEPENDENT_AMBULATORY_CARE_PROVIDER_SITE_OTHER): Payer: Self-pay | Admitting: Physician Assistant

## 2017-04-05 VITALS — BP 129/89 | HR 110 | Temp 97.8°F | Wt 180.8 lb

## 2017-04-05 DIAGNOSIS — J029 Acute pharyngitis, unspecified: Secondary | ICD-10-CM

## 2017-04-05 DIAGNOSIS — K130 Diseases of lips: Secondary | ICD-10-CM

## 2017-04-05 MED ORDER — AMOXICILLIN-POT CLAVULANATE 875-125 MG PO TABS: 1.0000 | ORAL_TABLET | Freq: Two times a day (BID) | ORAL | 0 refills | Status: DC

## 2017-04-05 MED ORDER — PREDNISONE 20 MG PO TABS: 60.0000 mg | ORAL_TABLET | Freq: Every day | ORAL | 0 refills | Status: DC

## 2017-04-05 NOTE — Patient Instructions (Signed)
Please call and ambulance/go to the Emergency Room if you have persistent drooling, sensation of throat closure, increased pain, difficulty speaking, or fever.  Sore Throat A sore throat is pain, burning, irritation, or scratchiness in the throat. When you have a sore throat, you may feel pain or tenderness in your throat when you swallow or talk. Many things can cause a sore throat, including:  An infection.  Seasonal allergies.  Dryness in the air.  Irritants, such as smoke or pollution.  Gastroesophageal reflux disease (GERD).  A tumor.  A sore throat is often the first sign of another sickness. It may happen with other symptoms, such as coughing, sneezing, fever, and swollen neck glands. Most sore throats go away without medical treatment. Follow these instructions at home:  Take over-the-counter medicines only as told by your health care provider.  Drink enough fluids to keep your urine clear or pale yellow.  Rest as needed.  To help with pain, try: ? Sipping warm liquids, such as broth, herbal tea, or warm water. ? Eating or drinking cold or frozen liquids, such as frozen ice pops. ? Gargling with a salt-water mixture 3-4 times a day or as needed. To make a salt-water mixture, completely dissolve -1 tsp of salt in 1 cup of warm water. ? Sucking on hard candy or throat lozenges. ? Putting a cool-mist humidifier in your bedroom at night to moisten the air. ? Sitting in the bathroom with the door closed for 5-10 minutes while you run hot water in the shower.  Do not use any tobacco products, such as cigarettes, chewing tobacco, and e-cigarettes. If you need help quitting, ask your health care provider. Contact a health care provider if:  You have a fever for more than 2-3 days.  You have symptoms that last (are persistent) for more than 2-3 days.  Your throat does not get better within 7 days.  You have a fever and your symptoms suddenly get worse. Get help right away  if:  You have difficulty breathing.  You cannot swallow fluids, soft foods, or your saliva.  You have increased swelling in your throat or neck.  You have persistent nausea and vomiting. This information is not intended to replace advice given to you by your health care provider. Make sure you discuss any questions you have with your health care provider. Document Released: 07/15/2004 Document Revised: 02/01/2016 Document Reviewed: 03/28/2015 Elsevier Interactive Patient Education  [employer].

## 2017-04-05 NOTE — Progress Notes (Signed)
Subjective:  Patient ID: Catherine Butler, female    DOB: 08-03-1960  Age: 56 y.o. MRN: [id number]  CC: sore throat  HPI  [name] a 56 y.o.femalewith a PMH of anxiety, asthma, arthritis, COPD, depression, HTN, and acid reflux presents with sore throat since three weeks ago. There is associated erythema of the gums and palate. Intense gum pain when brushing teeth. Associated mild drooling and an episode of sensation of throat swelling. She has also developed "cracking" of the skin at the corners of the mouth. Has not taken anything for relief. Denies fever, chills, neck pain, neck stiffness, edema of the jaw/face/neck, rash, speech impairment, headache, back pain, cough, CP, palpitations, wheezing, or diaphoresis.       Outpatient Medications Prior to Visit  Medication Sig Dispense Refill  . acetaminophen (TYLENOL) 500 MG tablet Take 1,000 mg by mouth 2 (two) times daily as needed for headache.    . albuterol (PROVENTIL HFA;VENTOLIN HFA) 108 (90 Base) MCG/ACT inhaler Inhale 2 puffs into the lungs every 6 (six) hours as needed for wheezing. 1 Inhaler 0  . amoxicillin-clavulanate (AUGMENTIN) 875-125 MG tablet Take 1 tablet by mouth 2 (two) times daily. 20 tablet 0  . atorvastatin (LIPITOR) 40 MG tablet Take 1 tablet (40 mg total) by mouth daily. 90 tablet 3  . benzonatate (TESSALON PERLES) 100 MG capsule Take 2 capsules (200 mg total) by mouth 3 (three) times daily as needed for cough. 42 capsule 0  . fluticasone (FLONASE) 50 MCG/ACT nasal spray Place 1 spray into both nostrils daily as needed for allergies or rhinitis.    Marland Kitchen gabapentin (NEURONTIN) 400 MG capsule Take 400 mg by mouth 4 (four) times daily.     Marland Kitchen ibuprofen (ADVIL,MOTRIN) 200 MG tablet Take 400 mg by mouth 2 (two) times daily as needed for headache.    . mirtazapine (REMERON) 15 MG tablet Take 15 mg by mouth at bedtime.    Marland Kitchen omeprazole (PRILOSEC) 40 MG capsule Take 1 capsule (40 mg total) by mouth 2 (two) times daily. 60  capsule 1  . ondansetron (ZOFRAN-ODT) 8 MG disintegrating tablet Take 1 tablet (8 mg total) by mouth every 8 (eight) hours as needed for nausea or vomiting. 30 tablet 0  . polyethylene glycol (MIRALAX) packet Take 17 g by mouth 2 (two) times daily. 14 each 0  . QUEtiapine (SEROQUEL) 400 MG tablet Take 400 mg by mouth at bedtime.     No facility-administered medications prior to visit.      ROS Review of Systems  Constitutional: Negative for chills, fever and malaise/fatigue.  HENT: Positive for sore throat. Negative for congestion and ear pain.   Eyes: Negative for blurred vision and pain.  Respiratory: Negative for cough and shortness of breath.   Cardiovascular: Negative for chest pain and palpitations.  Gastrointestinal: Negative for abdominal pain and nausea.  Genitourinary: Negative for dysuria and hematuria.  Musculoskeletal: Negative for back pain, joint pain, myalgias and neck pain.  Skin: Negative for rash.  Neurological: Negative for tingling and headaches.  Psychiatric/Behavioral: Negative for depression. The patient is not nervous/anxious.     Objective:  BP 129/89 (BP Location: Left Arm, Patient Position: Sitting, Cuff Size: Normal)   Pulse (!) 110   Temp 97.8 F (36.6 C) (Oral)   Wt 180 lb 12.8 oz (82 kg)   LMP 02/22/2006   SpO2 99%   BMI 30.49 kg/m   BP/Weight 04/05/2017 03/26/2017 02/15/2017  Systolic BP 129 113 113  Diastolic BP  89 86 84  Wt. (Lbs) 180.8 - 186.8  BMI 30.49 - 31.5      Physical Exam  Constitutional: She is oriented to person, place, and time.  Well developed, overweight, NAD, polite  HENT:  Head: Normocephalic and atraumatic.  TMs pearly gray, not retracted/bulging. Oropharynx with generalized erythema but no edema or exudates. Patient occasional wiping corners of mouth. Angular cheilitis present.   Eyes: No scleral icterus.  Neck: Normal range of motion. Neck supple. No thyromegaly present.  Cardiovascular: Normal rate, regular  rhythm and normal heart sounds.   Pulmonary/Chest: Effort normal and breath sounds normal.  Abdominal: Soft. Bowel sounds are normal. There is no tenderness.  Musculoskeletal: She exhibits no edema.  Lymphadenopathy:    She has cervical adenopathy (mild nontender enlargment of anterior cervical chaing lymph nodes.).  Neurological: She is alert and oriented to person, place, and time. No cranial nerve deficit. Coordination normal.  Skin: Skin is warm and dry. No rash noted. No erythema. No pallor.  Psychiatric: She has a normal mood and affect. Her behavior is normal. Thought content normal.  Vitals reviewed.    Assessment & Plan:      1. Sore throat - Hib vs other infectious etiology? Does not appear to be emergent, no edema of oropharynx/tongue, no Ludwig's angina symptoms besides very mild drooling. No sign of PTA. Patient advised on close monitoring of symptoms. Educated not to irritate back of throat/epiglottis. Instructed to keep close contact with me should any worsening or new symptoms arise.    - Begin Augmentin 875/125 mg one tablet po BID x10 days #20, zero refills - Begin Prednisone 60 mg qday x5 days, zero refills   2. Angular Cheilitis - Begin Augmentin 875/125 mg one tablet po BID x10 days #20, zero refills   Meds ordered this encounter  Medications  . predniSONE (DELTASONE) 20 MG tablet    Sig: Take 3 tablets (60 mg total) by mouth daily with breakfast.    Dispense:  15 tablet    Refill:  0    Order Specific Question:   Supervising Provider    Answer:   Quentin Angst [id number]  . amoxicillin-clavulanate (AUGMENTIN) 875-125 MG tablet    Sig: Take 1 tablet by mouth 2 (two) times daily.    Dispense:  20 tablet    Refill:  0    Order Specific Question:   Supervising Provider    Answer:   Quentin Angst [id number]    Follow-up: Return if symptoms worsen or fail to improve.   Loletta Specter PA

## 2017-04-05 NOTE — Progress Notes (Signed)
Pt states that she has pain swallowing, her mouth is raw, bruns when she eats or drinks.

## 2017-04-11 ENCOUNTER — Inpatient Hospital Stay (HOSPITAL_COMMUNITY): Payer: Medicaid Other

## 2017-04-11 ENCOUNTER — Inpatient Hospital Stay (HOSPITAL_COMMUNITY)
Admission: EM | Admit: 2017-04-11 | Discharge: 2017-04-20 | DRG: 096 | Disposition: A | Discharge status: Skilled Nursing Facility | Payer: Medicaid Other | Attending: Family Medicine | Admitting: Family Medicine

## 2017-04-11 ENCOUNTER — Emergency Department (HOSPITAL_COMMUNITY): Payer: Medicaid Other

## 2017-04-11 ENCOUNTER — Encounter (HOSPITAL_COMMUNITY): Payer: Self-pay | Admitting: Internal Medicine

## 2017-04-11 DIAGNOSIS — R296 Repeated falls: Secondary | ICD-10-CM | POA: Diagnosis present

## 2017-04-11 DIAGNOSIS — E876 Hypokalemia: Secondary | ICD-10-CM | POA: Diagnosis present

## 2017-04-11 DIAGNOSIS — F419 Anxiety disorder, unspecified: Secondary | ICD-10-CM | POA: Diagnosis present

## 2017-04-11 DIAGNOSIS — Z8249 Family history of ischemic heart disease and other diseases of the circulatory system: Secondary | ICD-10-CM

## 2017-04-11 DIAGNOSIS — Z833 Family history of diabetes mellitus: Secondary | ICD-10-CM

## 2017-04-11 DIAGNOSIS — J449 Chronic obstructive pulmonary disease, unspecified: Secondary | ICD-10-CM | POA: Diagnosis present

## 2017-04-11 DIAGNOSIS — Z803 Family history of malignant neoplasm of breast: Secondary | ICD-10-CM | POA: Diagnosis not present

## 2017-04-11 DIAGNOSIS — Z792 Long term (current) use of antibiotics: Secondary | ICD-10-CM | POA: Diagnosis not present

## 2017-04-11 DIAGNOSIS — G610 Guillain-Barre syndrome: Secondary | ICD-10-CM | POA: Diagnosis present

## 2017-04-11 DIAGNOSIS — R202 Paresthesia of skin: Secondary | ICD-10-CM

## 2017-04-11 DIAGNOSIS — Z800 Family history of malignant neoplasm of digestive organs: Secondary | ICD-10-CM

## 2017-04-11 DIAGNOSIS — M5124 Other intervertebral disc displacement, thoracic region: Secondary | ICD-10-CM | POA: Diagnosis present

## 2017-04-11 DIAGNOSIS — K219 Gastro-esophageal reflux disease without esophagitis: Secondary | ICD-10-CM | POA: Diagnosis present

## 2017-04-11 DIAGNOSIS — E785 Hyperlipidemia, unspecified: Secondary | ICD-10-CM | POA: Diagnosis present

## 2017-04-11 DIAGNOSIS — R29898 Other symptoms and signs involving the musculoskeletal system: Secondary | ICD-10-CM | POA: Diagnosis present

## 2017-04-11 DIAGNOSIS — R531 Weakness: Secondary | ICD-10-CM | POA: Diagnosis present

## 2017-04-11 DIAGNOSIS — R159 Full incontinence of feces: Secondary | ICD-10-CM | POA: Diagnosis present

## 2017-04-11 DIAGNOSIS — F17210 Nicotine dependence, cigarettes, uncomplicated: Secondary | ICD-10-CM | POA: Diagnosis present

## 2017-04-11 DIAGNOSIS — N3289 Other specified disorders of bladder: Secondary | ICD-10-CM | POA: Diagnosis present

## 2017-04-11 DIAGNOSIS — R339 Retention of urine, unspecified: Secondary | ICD-10-CM | POA: Diagnosis present

## 2017-04-11 DIAGNOSIS — Z791 Long term (current) use of non-steroidal anti-inflammatories (NSAID): Secondary | ICD-10-CM

## 2017-04-11 DIAGNOSIS — M47892 Other spondylosis, cervical region: Secondary | ICD-10-CM | POA: Diagnosis present

## 2017-04-11 DIAGNOSIS — F329 Major depressive disorder, single episode, unspecified: Secondary | ICD-10-CM | POA: Diagnosis present

## 2017-04-11 DIAGNOSIS — Z888 Allergy status to other drugs, medicaments and biological substances status: Secondary | ICD-10-CM

## 2017-04-11 DIAGNOSIS — R32 Unspecified urinary incontinence: Secondary | ICD-10-CM | POA: Diagnosis present

## 2017-04-11 DIAGNOSIS — Z79899 Other long term (current) drug therapy: Secondary | ICD-10-CM | POA: Diagnosis not present

## 2017-04-11 DIAGNOSIS — I10 Essential (primary) hypertension: Secondary | ICD-10-CM

## 2017-04-11 DIAGNOSIS — M1990 Unspecified osteoarthritis, unspecified site: Secondary | ICD-10-CM | POA: Diagnosis present

## 2017-04-11 LAB — URINALYSIS, ROUTINE W REFLEX MICROSCOPIC
BILIRUBIN URINE: NEGATIVE
GLUCOSE, UA: NEGATIVE mg/dL
Hgb urine dipstick: NEGATIVE
Ketones, ur: NEGATIVE mg/dL
Leukocytes, UA: NEGATIVE
NITRITE: NEGATIVE
PH: 7 (ref 5.0–8.0)
Protein, ur: NEGATIVE mg/dL
SPECIFIC GRAVITY, URINE: 1.004 — AB (ref 1.005–1.030)

## 2017-04-11 LAB — COMPREHENSIVE METABOLIC PANEL
ALBUMIN: 2.7 g/dL — AB (ref 3.5–5.0)
ALK PHOS: 240 U/L — AB (ref 38–126)
ALT: 43 U/L (ref 14–54)
ANION GAP: 16 — AB (ref 5–15)
AST: 70 U/L — AB (ref 15–41)
BILIRUBIN TOTAL: 1.3 mg/dL — AB (ref 0.3–1.2)
BUN: 6 mg/dL (ref 6–20)
CALCIUM: 8.4 mg/dL — AB (ref 8.9–10.3)
CO2: 21 mmol/L — AB (ref 22–32)
CREATININE: 0.67 mg/dL (ref 0.44–1.00)
Chloride: 97 mmol/L — ABNORMAL LOW (ref 101–111)
GFR calc Af Amer: >60 mL/min (ref >60)
GFR calc non Af Amer: >60 mL/min (ref >60)
GLUCOSE: 109 mg/dL — AB (ref 65–99)
Potassium: 3.5 mmol/L (ref 3.5–5.1)
SODIUM: 134 mmol/L — AB (ref 135–145)
TOTAL PROTEIN: 6.2 g/dL — AB (ref 6.5–8.1)

## 2017-04-11 LAB — CBC WITH DIFFERENTIAL/PLATELET
BASOS ABS: 0 10*3/uL (ref 0.0–0.1)
BASOS PCT: 0 %
EOS ABS: 0 10*3/uL (ref 0.0–0.7)
Eosinophils Relative: 0 %
HCT: 34.8 % — ABNORMAL LOW (ref 36.0–46.0)
HEMOGLOBIN: 11.8 g/dL — AB (ref 12.0–15.0)
LYMPHS ABS: 1.5 10*3/uL (ref 0.7–4.0)
Lymphocytes Relative: 17 %
MCH: 36.4 pg — ABNORMAL HIGH (ref 26.0–34.0)
MCHC: 33.9 g/dL (ref 30.0–36.0)
MCV: 107.4 fL — ABNORMAL HIGH (ref 78.0–100.0)
MONO ABS: 1.2 10*3/uL — AB (ref 0.1–1.0)
Monocytes Relative: 13 %
NEUTROS ABS: 6 10*3/uL (ref 1.7–7.7)
NEUTROS PCT: 70 %
Platelets: 239 10*3/uL (ref 150–400)
RBC: 3.24 MIL/uL — AB (ref 3.87–5.11)
RDW: 14.1 % (ref 11.5–15.5)
WBC: 8.7 10*3/uL (ref 4.0–10.5)

## 2017-04-11 LAB — VITAMIN B12: Vitamin B-12: 830 pg/mL (ref 180–914)

## 2017-04-11 MED ORDER — MIRTAZAPINE 30 MG PO TABS
15.0000 mg | ORAL_TABLET | Freq: Every day | ORAL | Status: DC
  Administered 2017-04-11 – 2017-04-13 (×3): 15 mg via ORAL
  Administered 2017-04-14 – 2017-04-15 (×2): 30 mg via ORAL
  Administered 2017-04-16 – 2017-04-19 (×4): 15 mg via ORAL
  Filled 2017-04-11 (×9): qty 1

## 2017-04-11 MED ORDER — SODIUM CHLORIDE 0.9% FLUSH: 3.0000 mL | INTRAVENOUS | Status: DC | PRN

## 2017-04-11 MED ORDER — CHLORHEXIDINE GLUCONATE 0.12 % MT SOLN
15.0000 mL | Freq: Two times a day (BID) | OROMUCOSAL | Status: DC
Start: 2017-04-11 — End: 2017-04-14
  Administered 2017-04-11 – 2017-04-14 (×6): 15 mL via OROMUCOSAL
  Filled 2017-04-11 (×5): qty 15

## 2017-04-11 MED ORDER — ENSURE ENLIVE PO LIQD
237.0000 mL | Freq: Two times a day (BID) | ORAL | Status: DC
  Administered 2017-04-13 – 2017-04-20 (×13): 237 mL via ORAL

## 2017-04-11 MED ORDER — SODIUM CHLORIDE 0.9% FLUSH
3.0000 mL | Freq: Two times a day (BID) | INTRAVENOUS | Status: DC
  Administered 2017-04-12 – 2017-04-13 (×3): 3 mL via INTRAVENOUS

## 2017-04-11 MED ORDER — GADOBENATE DIMEGLUMINE 529 MG/ML IV SOLN
15.0000 mL | Freq: Once | INTRAVENOUS | Status: AC | PRN
  Administered 2017-04-11: 15 mL via INTRAVENOUS

## 2017-04-11 MED ORDER — SODIUM CHLORIDE 0.9 % IV SOLN
250.0000 mL | INTRAVENOUS | Status: DC | PRN
Start: 2017-04-11 — End: 2017-04-13

## 2017-04-11 MED ORDER — HEPARIN SODIUM (PORCINE) 5000 UNIT/ML IJ SOLN
5000.0000 [IU] | Freq: Three times a day (TID) | INTRAMUSCULAR | Status: DC
  Administered 2017-04-11: 5000 [IU] via SUBCUTANEOUS
  Filled 2017-04-11: qty 1

## 2017-04-11 MED ORDER — ONDANSETRON HCL 4 MG PO TABS
4.0000 mg | ORAL_TABLET | Freq: Four times a day (QID) | ORAL | Status: DC | PRN
  Administered 2017-04-16 – 2017-04-18 (×2): 4 mg via ORAL
  Filled 2017-04-11 (×4): qty 1

## 2017-04-11 MED ORDER — ACETAMINOPHEN 650 MG RE SUPP
650.0000 mg | Freq: Four times a day (QID) | RECTAL | Status: DC | PRN
Start: 2017-04-11 — End: 2017-04-18

## 2017-04-11 MED ORDER — ACETAMINOPHEN 500 MG PO TABS: 1000.0000 mg | ORAL_TABLET | Freq: Two times a day (BID) | ORAL | Status: DC | PRN

## 2017-04-11 MED ORDER — ZOLPIDEM TARTRATE 5 MG PO TABS: 5.0000 mg | ORAL_TABLET | Freq: Every evening | ORAL | Status: DC | PRN

## 2017-04-11 MED ORDER — PANTOPRAZOLE SODIUM 40 MG PO TBEC
40.0000 mg | DELAYED_RELEASE_TABLET | Freq: Every day | ORAL | Status: DC
  Administered 2017-04-11 – 2017-04-20 (×10): 40 mg via ORAL
  Filled 2017-04-11 (×9): qty 1

## 2017-04-11 MED ORDER — ACETAMINOPHEN 325 MG PO TABS
650.0000 mg | ORAL_TABLET | Freq: Four times a day (QID) | ORAL | Status: DC | PRN
  Administered 2017-04-12 – 2017-04-20 (×13): 650 mg via ORAL
  Filled 2017-04-11 (×13): qty 2

## 2017-04-11 MED ORDER — ONDANSETRON HCL 4 MG/2ML IJ SOLN
4.0000 mg | Freq: Four times a day (QID) | INTRAMUSCULAR | Status: DC | PRN
  Administered 2017-04-11 – 2017-04-19 (×9): 4 mg via INTRAVENOUS
  Filled 2017-04-11 (×10): qty 2

## 2017-04-11 MED ORDER — ONDANSETRON 8 MG PO TBDP
8.0000 mg | ORAL_TABLET | Freq: Three times a day (TID) | ORAL | Status: DC | PRN
  Filled 2017-04-11: qty 1

## 2017-04-11 MED ORDER — ALBUTEROL SULFATE (2.5 MG/3ML) 0.083% IN NEBU
3.0000 mL | INHALATION_SOLUTION | Freq: Four times a day (QID) | RESPIRATORY_TRACT | Status: DC | PRN
  Administered 2017-04-18 – 2017-04-19 (×3): 3 mL via RESPIRATORY_TRACT
  Filled 2017-04-11 (×3): qty 3

## 2017-04-11 MED ORDER — LORAZEPAM 2 MG/ML IJ SOLN
1.0000 mg | Freq: Once | INTRAMUSCULAR | Status: AC
  Administered 2017-04-11: 1 mg via INTRAVENOUS
  Filled 2017-04-11: qty 1

## 2017-04-11 MED ORDER — QUETIAPINE FUMARATE 300 MG PO TABS
400.0000 mg | ORAL_TABLET | Freq: Every day | ORAL | Status: DC
  Administered 2017-04-11 – 2017-04-19 (×9): 400 mg via ORAL
  Filled 2017-04-11 (×9): qty 1

## 2017-04-11 MED ORDER — GABAPENTIN 400 MG PO CAPS
400.0000 mg | ORAL_CAPSULE | Freq: Four times a day (QID) | ORAL | Status: DC
  Administered 2017-04-11 – 2017-04-16 (×21): 400 mg via ORAL
  Filled 2017-04-11 (×21): qty 1

## 2017-04-11 MED ORDER — ORAL CARE MOUTH RINSE
15.0000 mL | Freq: Two times a day (BID) | OROMUCOSAL | Status: DC
Start: 2017-04-12 — End: 2017-04-18
  Administered 2017-04-12 – 2017-04-17 (×11): 15 mL via OROMUCOSAL

## 2017-04-11 MED ORDER — HYDROCODONE-ACETAMINOPHEN 5-325 MG PO TABS
1.0000 | ORAL_TABLET | Freq: Four times a day (QID) | ORAL | Status: DC | PRN
  Administered 2017-04-11 – 2017-04-14 (×9): 1 via ORAL
  Filled 2017-04-11 (×9): qty 1

## 2017-04-11 NOTE — Consult Note (Signed)
Neurology Consultation Reason for Consult: Urinary retention Referring Physician: Anitra LauthPlunkett, W  CC: Lower extremity weakness/numbness  History is obtained from: Patient  HPI: Catherine Butler is a 56 y.o. female with a history of 3 weeks progressive paresthesia affecting predominantly the legs, but also affecting the arms as well. She states that it has been getting progressively worse, presenting mostly as pins and needles, but also consisting of decreased sensation on the medial aspects of her thighs as well as bilaterally in the arms. She states that it is symmetric, affecting both arms and both legs equally and in the same distributions. Over the past couple of days, she has had more difficulty and noticed that she had not urinated, on arrival, she had a bladder scan showing 957.   She endorses some weight loss over the past few months. She denies diploplia, GI illness, fevers, pain.   ROS: A 14 point ROS was performed and is negative except as noted in the HPI.   Past Medical History:  Diagnosis Date  . Anxiety   . Arthritis   . Asthma   . COPD (chronic obstructive pulmonary disease) (HCC)   . Depression   . Hypertension   . Reflux      Family History  Problem Relation Age of Onset  . Diabetes Father   . Hypertension Father 50       heart attack  . Breast cancer Mother 30       lump removed, bile duct cancer  . Hypertension Brother   . Allergic rhinitis Paternal Grandfather      Social History:  reports that she has been smoking Cigarettes.  She has a 30.00 pack-year smoking history. She has never used smokeless tobacco. She reports that she drinks alcohol. She reports that she does not use drugs.   Exam: Current vital signs: BP 109/83 (BP Location: Left Arm)   Pulse (!) 116   Temp 98 F (36.7 C) (Oral)   Resp 19   Ht 5\' 6"  (1.676 m)   Wt 79.4 kg (175 lb)   LMP 02/22/2006   SpO2 99%   BMI 28.25 kg/m  Vital signs in last 24 hours: Temp:  [97.6 F (36.4 C)-98 F  (36.7 C)] 98 F (36.7 C) (10/22 1202) Pulse Rate:  [63-117] 116 (10/22 1531) Resp:  [18-20] 19 (10/22 1531) BP: (109-118)/(68-93) 109/83 (10/22 1531) SpO2:  [99 %-100 %] 99 % (10/22 1531) Weight:  [79.4 kg (175 lb)] 79.4 kg (175 lb) (10/22 1002)   Physical Exam  Constitutional: Appears well-developed and well-nourished.  Psych: Affect appropriate to situation Eyes: No scleral injection HENT: No OP obstrucion Head: Normocephalic.  Cardiovascular: Normal rate and regular rhythm.  Respiratory: Effort normal and breath sounds normal to anterior ascultation GI: Soft.  No distension. There is no tenderness.  Skin: WDI  Neuro: Mental Status: Patient is awake, alert, oriented to person, place, month, year, and situation. Patient is able to give a clear and coherent history. No signs of aphasia or neglect Cranial Nerves: II: Visual Fields are full. Pupils are equal, round, and reactive to light.   III,IV, VI: EOMI without ptosis or diploplia.  V: Facial sensation is symmetric to temperature VII: Facial movement is symmetric.  VIII: hearing is intact to voice X: Uvula elevates symmetrically XI: Shoulder shrug is symmetric. XII: tongue is midline without atrophy or fasciculations.  Motor: Tone is normal. Bulk is normal. She has at least 4+/5 strength throughout UE, no drift, but does appear to have  some proximal 4/5 weakness.  Sensory: Sensation is diminished in the medial thighs, as well as paresthetic circumferentially in the arms. She does not have a clear level to temp on her back.  Deep Tendon Reflexes: 2+ and symmetric in the biceps. She has decreased reflexes at bilateral knees, 2+ left ankle, 1+ right ankle.  Cerebellar: FNF intact bilaterally  I have reviewed labs in epic and the results pertinent to this consultation are: Low protein, borderline LFTs, AST > ALT   I have reviewed the images obtained:MRI T and L-spine - negative  Impression: 56 year old female with  progressive paresthesias/weakness. This could localize to a central cord lesion, and I do feel that cervical spine imaging is needed. Inflammatory polyradiculopathy (e.g. AIDP) would also be a possibility, but the presence of reflexes at the ankles and biceps would argue against this.  Another possibility would be a combined myeloneuropathy such as B12 or copper deficiency can cause.   Recommendations: 1) B12, serum copper 2) MRI cervical spine and lumbar spine with contrast 3) PT 4) neurology will follow   Ritta Slot, MD [facility] [phone]  If 7pm- 7am, please page neurology on call as listed in [facility].

## 2017-04-11 NOTE — ED Notes (Signed)
Call son with update:   Catherine CoffinJosh Butler [phone]

## 2017-04-11 NOTE — ED Notes (Signed)
Pt requested medication for her leg spasms/pain. Made Dr. Sharyon MedicusHijazi aware of pt's request. No new orders at this time.

## 2017-04-11 NOTE — ED Triage Notes (Addendum)
[facility] c/o urinary retention and bladder pain. Pt reports numbness from waist down for which she has been referred to a neurologist. Pt reports falling twice trying to get to the bathroom last night. Legs feel like "limp noodles."  Pt is currently on antibiotics for a bacterial infection in the mouth.

## 2017-04-11 NOTE — ED Notes (Signed)
Unable to draw labs from IV. Will straight stick to draw labs.

## 2017-04-11 NOTE — H&P (Signed)
[facility]                                                                                    Patient Demographics  Catherine Butler, is a 56 y.o. female  CSN: [id number]  MRN: [id number]  DOB - [birth year]  Admit Date - 04/11/2017  Outpatient Primary MD for the patient is Loletta Specter, PA-C   With History of -  Past Medical History:  Diagnosis Date  . Anxiety   . Arthritis   . Asthma   . COPD (chronic obstructive pulmonary disease) (HCC)   . Depression   . Hypertension   . Reflux       Past Surgical History:  Procedure Laterality Date  . LAPAROSCOPY    . laporscopy surgery for ovarian cyst  20 years ago  . TONSILLECTOMY      in for   Chief Complaint  Patient presents with  . Urinary Retention     HPI  Catherine Butler  is a 56 y.o. female, with past medical history significant for COPD, hypertension, anxiety and depression presenting today with 2 days history of urinary retention and stool incontinence. Patient complains of back pain with numbness in her lower extremities in addition to weakness and had multiple falls lately. Patient denies any burning urination, blood in the urine. No fever or chills. No history of flu or flu shot, lately. Patient reports history of upper extremity numbness in addition to lower extremity numbness for the last 1 year. In the emergency room she was also noted to have urinary retention with 1 L of urine catheterized.    Review of Systems    In addition to the HPI above, No Fever-chills, No Headache, No changes with Vision or hearing, No problems swallowing food or Liquids, No Chest pain, Cough or Shortness of Breath, No Abdominal pain, No Nausea or Vommitting, Bowel movements are regular, No Blood in stool or Urine, No dysuria, No new skin rashes or bruises, No new joints pains-aches,  No recent weight gain or loss, No polyuria, polydypsia or polyphagia, No significant Mental Stressors.   Social  History Social History  Substance Use Topics  . Smoking status: Current Every Day Smoker    Packs/day: 1.00    Years: 30.00    Types: Cigarettes  . Smokeless tobacco: Never Used     Comment: states "might be interested in the smoking cessation program"  Pt given counseling related to smoking cessation  . Alcohol use Yes     Comment: couple x a week     Family History Family History  Problem Relation Age of Onset  . Diabetes Father   . Hypertension Father 50       heart attack  . Breast cancer Mother 30       lump removed, bile duct cancer  . Hypertension Brother   . Allergic rhinitis Paternal Grandfather      Prior to Admission medications   Medication Sig Start Date End Date Taking? Authorizing Provider  acetaminophen (TYLENOL) 500 MG tablet Take 1,000 mg by mouth 2 (two) times daily as needed for headache.   Yes [provider]  albuterol (  PROVENTIL HFA;VENTOLIN HFA) 108 (90 Base) MCG/ACT inhaler Inhale 2 puffs into the lungs every 6 (six) hours as needed for wheezing. 06/18/15  Yes Standley Brooking, MD  amoxicillin-clavulanate (AUGMENTIN) 875-125 MG tablet Take 1 tablet by mouth 2 (two) times daily. 04/05/17  Yes Loletta Specter, PA-C  gabapentin (NEURONTIN) 400 MG capsule Take 400 mg by mouth 4 (four) times daily.    Yes [provider]  ibuprofen (ADVIL,MOTRIN) 200 MG tablet Take 400 mg by mouth 2 (two) times daily as needed for headache.   Yes [provider]  mirtazapine (REMERON) 15 MG tablet Take 15 mg by mouth at bedtime.   Yes [provider]  omeprazole (PRILOSEC) 40 MG capsule Take 1 capsule (40 mg total) by mouth 2 (two) times daily. 01/03/17  Yes Vassie Loll, MD  ondansetron (ZOFRAN-ODT) 8 MG disintegrating tablet Take 1 tablet (8 mg total) by mouth every 8 (eight) hours as needed for nausea or vomiting. 01/03/17  Yes Vassie Loll, MD  polyethylene glycol [facility]) packet Take 17 g by mouth 2 (two) times daily. Patient  taking differently: Take 17 g by mouth daily as needed for moderate constipation.  03/26/17  Yes Hedges, Tinnie Gens, PA-C  QUEtiapine (SEROQUEL) 400 MG tablet Take 400 mg by mouth at bedtime.   Yes [provider]  atorvastatin (LIPITOR) 40 MG tablet Take 1 tablet (40 mg total) by mouth daily. Patient not taking: Reported on 04/11/2017 02/07/17   Loletta Specter, PA-C  benzonatate (TESSALON PERLES) 100 MG capsule Take 2 capsules (200 mg total) by mouth 3 (three) times daily as needed for cough. Patient not taking: Reported on 04/11/2017 02/01/17   Loletta Specter, PA-C  predniSONE (DELTASONE) 20 MG tablet Take 3 tablets (60 mg total) by mouth daily with breakfast. Patient not taking: Reported on 04/11/2017 04/05/17   Loletta Specter, PA-C    Allergies  Allergen Reactions  . Lipitor [Atorvastatin] Other (See Comments)    "really bad leg pain"    Physical Exam  Vitals  Blood pressure 109/83, pulse (!) 116, temperature 98 F (36.7 C), temperature source Oral, resp. rate 19, height 5\' 6"  (1.676 m), weight 79.4 kg (175 lb), last menstrual period 02/22/2006, SpO2 99 %.   1. General well-developed, well-nourished female in no acute distress  2. Depressed affect and insight, Not Suicidal or Homicidal, Awake Alert, Oriented X 3.  3. No F.N deficits, grossly.  4. Ears and Eyes appear Normal, Conjunctivae clear, PERRLA. Moist Oral Mucosa.  5. Supple Neck, No JVD, No cervical lymphadenopathy appriciated, No Carotid Bruits.  6. Symmetrical Chest wall movement, Good air movement bilaterally, CTAB.  7. RRR, No Gallops, Rubs or Murmurs, No Parasternal Heave.  8. Positive Bowel Sounds, Abdomen Soft, Non tender, No organomegaly appriciated,No rebound -guarding or rigidity.  9.  No Cyanosis, Normal Skin Turgor, No Skin Rash or Bruise.  10. Lower extremity 4+ over 5 bilaterally. Babinski normal bilaterally no hyperreflexia, mild decrease in sensations bilaterally.    Data  Review  CBC  Recent Labs Lab 04/11/17 1055  WBC 8.7  HGB 11.8*  HCT 34.8*  PLT 239  MCV 107.4*  MCH 36.4*  MCHC 33.9  RDW 14.1  LYMPHSABS 1.5  MONOABS 1.2*  EOSABS 0.0  BASOSABS 0.0   ------------------------------------------------------------------------------------------------------------------  Chemistries   Recent Labs Lab 04/11/17 1055  NA 134*  K 3.5  CL 97*  CO2 21*  GLUCOSE 109*  BUN 6  CREATININE 0.67  CALCIUM 8.4*  AST 70*  ALT 43  ALKPHOS 240*  BILITOT 1.3*   ------------------------------------------------------------------------------------------------------------------ estimated creatinine clearance is 85.4 mL/min (by C-G formula based on SCr of 0.67 mg/dL). ------------------------------------------------------------------------------------------------------------------ No results for input(s): TSH, T4TOTAL, T3FREE, THYROIDAB in the last 72 hours.  Invalid input(s): FREET3   Coagulation profile No results for input(s): INR, PROTIME in the last 168 hours. ------------------------------------------------------------------------------------------------------------------- No results for input(s): DDIMER in the last 72 hours. -------------------------------------------------------------------------------------------------------------------  Cardiac Enzymes No results for input(s): CKMB, TROPONINI, MYOGLOBIN in the last 168 hours.  Invalid input(s): CK ------------------------------------------------------------------------------------------------------------------ Invalid input(s): POCBNP   ---------------------------------------------------------------------------------------------------------------  Urinalysis    Component Value Date/Time   COLORURINE YELLOW 04/11/2017 0930   APPEARANCEUR CLEAR 04/11/2017 0930   LABSPEC 1.004 (L) 04/11/2017 0930   PHURINE 7.0 04/11/2017 0930   GLUCOSEU NEGATIVE 04/11/2017 0930   HGBUR NEGATIVE  04/11/2017 0930   BILIRUBINUR NEGATIVE 04/11/2017 0930   KETONESUR NEGATIVE 04/11/2017 0930   PROTEINUR NEGATIVE 04/11/2017 0930   UROBILINOGEN 0.2 09/13/2012 0124   NITRITE NEGATIVE 04/11/2017 0930   LEUKOCYTESUR NEGATIVE 04/11/2017 0930    ----------------------------------------------------------------------------------------------------------------   Imaging results:   Mr Thoracic Spine Wo Contrast  Result Date: 04/11/2017 CLINICAL DATA:  Urinary retention and bladder pain. Numbness from the waist down. The patient suffered 2 falls last night. Initial encounter. EXAM: MRI THORACIC SPINE WITHOUT CONTRAST TECHNIQUE: Multiplanar, multisequence MR imaging of the thoracic spine was performed. No intravenous contrast was administered. COMPARISON:  CT chest 06/18/2015. FINDINGS: Alignment: There is mild exaggeration of the normal thoracic kyphosis. Vertebrae: Remote anterior, superior endplate compression fracture of T4 with vertebral body height loss of approximately 25% is unchanged. Prominent Schmorl's node in the superior endplate of T11 is also unchanged. There is no acute fracture or worrisome marrow lesion. Cord:  Normal signal throughout.  Epidural lipomatosis noted. Paraspinal and other soft tissues: Hiatal hernia is noted. Disc levels: T4-5: Minimal disc bulge. No bony retropulsion off the superior endplate of T4. The central canal and foramina are widely patent. T7-8: Very shallow left paracentral protrusion. The central canal and foramina are widely patent. T8-9: Very shallow right paracentral protrusion with some cephalad extension. The disc indents the ventral thecal sac but the central canal and foramina are widely patent. Intervertebral disc spaces are otherwise unremarkable. IMPRESSION: No acute abnormality or finding to explain the patient's symptoms. Mild degenerative disc disease most notable at T7-8 and T8-9 where there are small disc protrusions. The central canal and foramina are  widely patent at all levels. Epidural lipomatosis. Very mild, remote superior endplate compression fracture of T4 without bony retropulsion. Electronically Signed   By: Drusilla Kanner M.D.   On: 04/11/2017 14:55   Mr Lumbar Spine Wo Contrast  Result Date: 04/11/2017 CLINICAL DATA:  Muscle weakness. EXAM: MRI LUMBAR SPINE WITHOUT CONTRAST TECHNIQUE: Multiplanar, multisequence MR imaging of the lumbar spine was performed. No intravenous contrast was administered. COMPARISON:  None. FINDINGS: Segmentation:  Standard. Alignment:  Physiologic. Vertebrae:  No fracture, evidence of discitis, or bone lesion. Conus medullaris: Extends to the L1 level and appears normal. Paraspinal and other soft tissues: Negative. Disc levels: T12-L1:  Normal. L1-2:  Disc desiccation.  Otherwise normal. L2-3: Tiny disc bulge into the left neural foramen. Otherwise normal. L3-4: Normal disc. Minimal degenerative changes of the facet joints. L4-5:  Normal. L5-S1:  Normal. IMPRESSION: No significant abnormality of the lumbar spine. No spinal or foraminal stenosis. No significant facet joint disease. Distal spinal cord appears normal. Electronically Signed   By: Francene Boyers M.D.   On:  04/11/2017 14:09   Ct Abdomen Pelvis W Contrast  Result Date: 03/26/2017 CLINICAL DATA:  RIGHT lower quadrant abdominal pain, constipation, last bowel movement 5 weeks ago, nausea, vomiting, hypertension, asthma, COPD, smoker EXAM: CT ABDOMEN AND PELVIS WITH CONTRAST TECHNIQUE: Multidetector CT imaging of the abdomen and pelvis was performed using the standard protocol following bolus administration of intravenous contrast. CONTRAST:  <See Chart> ISOVUE-300 IOPAMIDOL (ISOVUE-300) INJECTION 61% COMPARISON:  01/01/2017 FINDINGS: Lower chest: Subsegmental atelectasis LEFT lower lobe, improved. Minimal dependent atelectasis RIGHT lower lobe. Hepatobiliary: Diffuse fatty infiltration of liver. Gallbladder unremarkable. No discrete hepatic mass. Pancreas:  Scattered parenchymal calcifications question chronic calcific pancreatitis. No obvious pancreatic mass or ductal dilatation. Spleen: Normal appearance Adrenals/Urinary Tract: Adrenal glands, kidneys, ureters, and bladder normal appearance Stomach/Bowel: Normal appendix. Increased stool throughout colon. Moderate-sized hiatal hernia. Stomach and bowel loops otherwise unremarkable. Vascular/Lymphatic: Atherosclerotic calcification aorta without aneurysm. No adenopathy. Reproductive: Atrophic uterus with unremarkable adnexa Other: No free air or free fluid. No hernia or acute inflammatory process. Musculoskeletal: Chronic superior endplate compression fracture of T11. No acute osseous findings. IMPRESSION: Marked fatty infiltration of liver. Scattered parenchymal calcifications within pancreas question chronic calcific pancreatitis. Increased stool in colon consistent with history of constipation. Moderate-sized hiatal hernia. Aortic Atherosclerosis (ICD10-I70.0). Electronically Signed   By: Ulyses SouthwardMark  Boles M.D.   On: 03/26/2017 19:30      Assessment & Plan  1. Lower extremity weakness and numbness 2. Urinary retention 3. Stool incontinence  Plan MedSurg Place Foley catheter MRI of the cervical spine  neurology following   DVT Prophylaxis Heparin   AM Labs Ordered, also please review Full Orders    Code Status full  Disposition Plan: Undetermined  Time spent in minutes : 42 minutes  Condition GUARDED   @SIGNATURE @

## 2017-04-11 NOTE — Progress Notes (Signed)
Report called by Marcelino DusterMichelle, RN.

## 2017-04-11 NOTE — ED Notes (Signed)
Patient transported to MRI 

## 2017-04-11 NOTE — ED Notes (Signed)
Bed: [id number] Expected date:  Expected time:  Means of arrival:  Comments: EMS-urinary retention

## 2017-04-11 NOTE — ED Notes (Signed)
Patient returned from MRI.

## 2017-04-11 NOTE — ED Notes (Signed)
I stuck patient x1, was unsuccessful x1. Another NT is going to try again to obtain lab work

## 2017-04-11 NOTE — ED Notes (Signed)
MRI called stating they will be ready for pt in about an hour and a half (1810). Pt made aware.

## 2017-04-11 NOTE — ED Notes (Signed)
Bladder scan volume: 957

## 2017-04-11 NOTE — ED Provider Notes (Addendum)
[facility]-EMERGENCY DEPT Provider Note   CSN: [id number] Arrival date & time: 04/11/17  0915     History   Chief Complaint Chief Complaint  Patient presents with  . Urinary Retention    HPI Catherine Butler is a 56 y.o. female.  Patient is a 56 year old female with a history of COPD, depression, hypertension, anxiety presenting today by EMS for worsening in urinary retention.  Patient states for the last 2 months she has had progressive lower extremity weakness numbness in her lower back and weakness in her legs which got abruptly worse yesterday.  She states yesterday she did not even know when she was having bowel movements and had a BM on herself multiple times and has been unable to urinate.  In the last 24 hours she has had dribbling but no normal stream.  She denies any abdominal pain but states her abdomen feels numb to the bellybutton as well as in the back.  She has numbness in her upper legs but does have sensation in the lower portion of her legs.  She states her legs have just become so weak she has fallen multiple times when trying to walk.  She denies any back pain, fever, abdominal pain, nausea or vomiting.  She denies any recent medication changes.  She had seen her doctor about this and was scheduled to see a neurologist in 1 month but has not had any imaging.  She denies any drug use but does smoke cigarettes.  She denies any prior injury to the back or back surgeries.  Symptoms did not start after a trauma.   The history is provided by the patient and the EMS personnel.    Past Medical History:  Diagnosis Date  . Anxiety   . Arthritis   . Asthma   . COPD (chronic obstructive pulmonary disease) (HCC)   . Depression   . Hypertension   . Reflux     Patient Active Problem List   Diagnosis Date Noted  . Abdominal pain 01/01/2017  . Sepsis (HCC) 01/01/2017  . Tobacco abuse 01/01/2017  . Abnormal LFTs 01/01/2017  . Depression   . COPD  (chronic obstructive pulmonary disease) (HCC) 06/16/2015    Past Surgical History:  Procedure Laterality Date  . LAPAROSCOPY    . laporscopy surgery for ovarian cyst  20 years ago  . TONSILLECTOMY      OB History    Gravida Para Term Preterm AB Living   3 1 1   1 1    SAB TAB Ectopic Multiple Live Births   1               Home Medications    Prior to Admission medications   Medication Sig Start Date End Date Taking? Authorizing Provider  acetaminophen (TYLENOL) 500 MG tablet Take 1,000 mg by mouth 2 (two) times daily as needed for headache.    [provider]  albuterol (PROVENTIL HFA;VENTOLIN HFA) 108 (90 Base) MCG/ACT inhaler Inhale 2 puffs into the lungs every 6 (six) hours as needed for wheezing. 06/18/15   Standley Brooking, MD  amoxicillin-clavulanate (AUGMENTIN) 875-125 MG tablet Take 1 tablet by mouth 2 (two) times daily. 04/05/17   Loletta Specter, PA-C  atorvastatin (LIPITOR) 40 MG tablet Take 1 tablet (40 mg total) by mouth daily. 02/07/17   Loletta Specter, PA-C  benzonatate (TESSALON PERLES) 100 MG capsule Take 2 capsules (200 mg total) by mouth 3 (three) times daily as needed for cough.  02/01/17   Loletta SpecterGomez, Roger David, PA-C  fluticasone [facility](FLONASE) 50 MCG/ACT nasal spray Place 1 spray into both nostrils daily as needed for allergies or rhinitis.    [provider]  gabapentin (NEURONTIN) 400 MG capsule Take 400 mg by mouth 4 (four) times daily.     [provider]  ibuprofen (ADVIL,MOTRIN) 200 MG tablet Take 400 mg by mouth 2 (two) times daily as needed for headache.    [provider]  mirtazapine (REMERON) 15 MG tablet Take 15 mg by mouth at bedtime.    [provider]  omeprazole (PRILOSEC) 40 MG capsule Take 1 capsule (40 mg total) by mouth 2 (two) times daily. 01/03/17   Vassie LollMadera, Carlos, MD  ondansetron (ZOFRAN-ODT) 8 MG disintegrating tablet Take 1 tablet (8 mg total) by mouth every 8 (eight) hours as needed for nausea or  vomiting. 01/03/17   Vassie LollMadera, Carlos, MD  polyethylene glycol [facility](MIRALAX) packet Take 17 g by mouth 2 (two) times daily. 03/26/17   Hedges, Tinnie GensJeffrey, PA-C  predniSONE (DELTASONE) 20 MG tablet Take 3 tablets (60 mg total) by mouth daily with breakfast. 04/05/17   Loletta SpecterGomez, Roger David, PA-C  QUEtiapine (SEROQUEL) 400 MG tablet Take 400 mg by mouth at bedtime.    [provider]    Family History Family History  Problem Relation Age of Onset  . Diabetes Father   . Hypertension Father 50       heart attack  . Breast cancer Mother 30       lump removed, bile duct cancer  . Hypertension Brother   . Allergic rhinitis Paternal Grandfather     Social History Social History  Substance Use Topics  . Smoking status: Current Every Day Smoker    Packs/day: 1.00    Years: 30.00    Types: Cigarettes  . Smokeless tobacco: Never Used     Comment: states "might be interested in the smoking cessation program"  Pt given counseling related to smoking cessation  . Alcohol use Yes     Comment: couple x a week     Allergies   Patient has no known allergies.   Review of Systems Review of Systems  All other systems reviewed and are negative.    Physical Exam Updated Vital Signs BP (!) 111/93 (BP Location: Left Arm)   Pulse (!) 117   Temp 97.6 F (36.4 C) (Oral)   Resp 20   LMP 02/22/2006   SpO2 100%   Physical Exam  Constitutional: She is oriented to person, place, and time. She appears well-developed and well-nourished. No distress.  HENT:  Head: Normocephalic and atraumatic.  Cracking of the lips, irritation and erythema to the gums  Eyes: Pupils are equal, round, and reactive to light. Conjunctivae and EOM are normal.  Neck: Normal range of motion. Neck supple.  Cardiovascular: Regular rhythm and intact distal pulses.  Tachycardia present.   No murmur heard. Pulmonary/Chest: Effort normal and breath sounds normal. No respiratory distress. She has no wheezes. She has no rales.    Abdominal: Soft. She exhibits no distension. There is no tenderness. There is no rebound and no guarding.  Genitourinary:  Genitourinary Comments: Mildly decreased rectal tone but tone is present.  Decreased sensation but can feel the finger on digital rectal exam  Musculoskeletal: Normal range of motion. She exhibits no edema or tenderness.  Neurological: She is alert and oriented to person, place, and time. A sensory deficit is present. No cranial nerve deficit. She displays no Babinski's  sign on the right side. She displays no Babinski's sign on the left side.  Reflex Scores:      Patellar reflexes are 1+ on the right side and 1+ on the left side. 4 out of 5 strength in bilateral lower extremities.  5 out of 5 strength in bilateral upper extremities.  Decreased sensation over T10 dermatome and below to approximately the knee.  Patient has decreased sensation over the bilateral medial thighs but normal sensation over the bilateral lateral portion of the lower legs.  No clonus.  Patient not ambulated due to generalized weakness  Skin: Skin is warm and dry. No rash noted. No erythema.  Psychiatric: She has a normal mood and affect. Her behavior is normal.  Nursing note and vitals reviewed.    ED Treatments / Results  Labs (all labs ordered are listed, but only abnormal results are displayed) Labs Reviewed  URINALYSIS, ROUTINE W REFLEX MICROSCOPIC - Abnormal; Notable for the following:       Result Value   Specific Gravity, Urine 1.004 (*)    All other components within normal limits  CBC WITH DIFFERENTIAL/PLATELET - Abnormal; Notable for the following:    RBC 3.24 (*)    Hemoglobin 11.8 (*)    HCT 34.8 (*)    MCV 107.4 (*)    MCH 36.4 (*)    Monocytes Absolute 1.2 (*)    All other components within normal limits  COMPREHENSIVE METABOLIC PANEL - Abnormal; Notable for the following:    Sodium 134 (*)    Chloride 97 (*)    CO2 21 (*)    Glucose, Bld 109 (*)    Calcium 8.4 (*)     Total Protein 6.2 (*)    Albumin 2.7 (*)    AST 70 (*)    Alkaline Phosphatase 240 (*)    Total Bilirubin 1.3 (*)    Anion gap 16 (*)    All other components within normal limits    EKG  EKG Interpretation None       Radiology Mr Thoracic Spine Wo Contrast  Result Date: 04/11/2017 CLINICAL DATA:  Urinary retention and bladder pain. Numbness from the waist down. The patient suffered 2 falls last night. Initial encounter. EXAM: MRI THORACIC SPINE WITHOUT CONTRAST TECHNIQUE: Multiplanar, multisequence MR imaging of the thoracic spine was performed. No intravenous contrast was administered. COMPARISON:  CT chest 06/18/2015. FINDINGS: Alignment: There is mild exaggeration of the normal thoracic kyphosis. Vertebrae: Remote anterior, superior endplate compression fracture of T4 with vertebral body height loss of approximately 25% is unchanged. Prominent Schmorl's node in the superior endplate of T11 is also unchanged. There is no acute fracture or worrisome marrow lesion. Cord:  Normal signal throughout.  Epidural lipomatosis noted. Paraspinal and other soft tissues: Hiatal hernia is noted. Disc levels: T4-5: Minimal disc bulge. No bony retropulsion off the superior endplate of T4. The central canal and foramina are widely patent. T7-8: Very shallow left paracentral protrusion. The central canal and foramina are widely patent. T8-9: Very shallow right paracentral protrusion with some cephalad extension. The disc indents the ventral thecal sac but the central canal and foramina are widely patent. Intervertebral disc spaces are otherwise unremarkable. IMPRESSION: No acute abnormality or finding to explain the patient's symptoms. Mild degenerative disc disease most notable at T7-8 and T8-9 where there are small disc protrusions. The central canal and foramina are widely patent at all levels. Epidural lipomatosis. Very mild, remote superior endplate compression fracture of T4 without bony retropulsion.  Electronically Signed   By: Drusilla Kanner M.D.   On: 04/11/2017 14:55   Mr Lumbar Spine Wo Contrast  Result Date: 04/11/2017 CLINICAL DATA:  Muscle weakness. EXAM: MRI LUMBAR SPINE WITHOUT CONTRAST TECHNIQUE: Multiplanar, multisequence MR imaging of the lumbar spine was performed. No intravenous contrast was administered. COMPARISON:  None. FINDINGS: Segmentation:  Standard. Alignment:  Physiologic. Vertebrae:  No fracture, evidence of discitis, or bone lesion. Conus medullaris: Extends to the L1 level and appears normal. Paraspinal and other soft tissues: Negative. Disc levels: T12-L1:  Normal. L1-2:  Disc desiccation.  Otherwise normal. L2-3: Tiny disc bulge into the left neural foramen. Otherwise normal. L3-4: Normal disc. Minimal degenerative changes of the facet joints. L4-5:  Normal. L5-S1:  Normal. IMPRESSION: No significant abnormality of the lumbar spine. No spinal or foraminal stenosis. No significant facet joint disease. Distal spinal cord appears normal. Electronically Signed   By: Francene Boyers M.D.   On: 04/11/2017 14:09    Procedures Procedures (including critical care time)  Medications Ordered in ED Medications  LORazepam (ATIVAN) injection 1 mg (not administered)     Initial Impression / Assessment and Plan / ED Course  I have reviewed the triage vital signs and the nursing notes.  Pertinent labs & imaging results that were available during my care of the patient were reviewed by me and considered in my medical decision making (see chart for details).     Patient presenting today with urinary retention, fecal incontinence and numbness from the waist down.  Patient has incomplete sensory numbness on exam and decreased strength.  She is able to move bilateral legs and denies any pain in her back.  Patient has urinary retention of over 900 mL's.  On exam however she does have some rectal tone but it is decreased.  Concern for potential spinal cord compression, tumor or  anomaly causing her symptoms today.  She denies any infectious symptoms and has no history of IV drug use. Foley catheter was placed.  MRI of the thoracic and lumbar spine pending for further evaluation of her symptoms.  3:14 PM MR neg for acute pathology as the cause of pt's sx.  Foley placed and 1L of urine removed.  Discussed with Dr. Amada Jupiter who recommended doing an MR of the cervical spine and he will come to evaluate the pt.  Pt does smoke but no hx of alcohol abuse but states since Jan has lost 35-40 lbs due to not eating well.  States when she eats she becomes nauseated.  Denies headache or vision changes.  Final Clinical Impressions(s) / ED Diagnoses   Final diagnoses:  Urinary retention  Weakness of both lower extremities    New Prescriptions New Prescriptions   No medications on file     Gwyneth Sprout, MD 04/11/17 1514    Gwyneth Sprout, MD 04/11/17 1516    Gwyneth Sprout, MD 04/11/17 1516

## 2017-04-11 NOTE — Progress Notes (Signed)
Admitted to room 1337 from ED.  Denies pain.

## 2017-04-11 NOTE — ED Notes (Signed)
ED Provider at bedside. 

## 2017-04-12 LAB — BASIC METABOLIC PANEL
Anion gap: 12 (ref 5–15)
BUN: 5 mg/dL — AB (ref 6–20)
CALCIUM: 8.3 mg/dL — AB (ref 8.9–10.3)
CO2: 23 mmol/L (ref 22–32)
Chloride: 103 mmol/L (ref 101–111)
Creatinine, Ser: 0.68 mg/dL (ref 0.44–1.00)
GFR calc Af Amer: >60 mL/min (ref >60)
Glucose, Bld: 95 mg/dL (ref 65–99)
POTASSIUM: 3 mmol/L — AB (ref 3.5–5.1)
Sodium: 138 mmol/L (ref 135–145)

## 2017-04-12 LAB — RAPID URINE DRUG SCREEN, HOSP PERFORMED
Amphetamines: NOT DETECTED
BENZODIAZEPINES: NOT DETECTED
Barbiturates: NOT DETECTED
Cocaine: NOT DETECTED
OPIATES: NOT DETECTED
Tetrahydrocannabinol: NOT DETECTED

## 2017-04-12 LAB — VITAMIN B12: VITAMIN B 12: 696 pg/mL (ref 180–914)

## 2017-04-12 LAB — SEDIMENTATION RATE: Sed Rate: 12 mm/hr (ref 0–22)

## 2017-04-12 LAB — C-REACTIVE PROTEIN: CRP: 2.6 mg/dL — ABNORMAL HIGH (ref <1.0)

## 2017-04-12 LAB — COPPER, SERUM: COPPER: 96 ug/dL (ref 72–166)

## 2017-04-12 LAB — ETHANOL

## 2017-04-12 MED ORDER — ADULT MULTIVITAMIN W/MINERALS CH
1.0000 | ORAL_TABLET | Freq: Every day | ORAL | Status: DC
  Administered 2017-04-12 – 2017-04-20 (×9): 1 via ORAL
  Filled 2017-04-12 (×9): qty 1

## 2017-04-12 MED ORDER — NICOTINE 21 MG/24HR TD PT24
21.0000 mg | MEDICATED_PATCH | Freq: Every day | TRANSDERMAL | Status: DC
  Administered 2017-04-12 – 2017-04-20 (×9): 21 mg via TRANSDERMAL
  Filled 2017-04-12 (×8): qty 1

## 2017-04-12 MED ORDER — HEPARIN SODIUM (PORCINE) 5000 UNIT/ML IJ SOLN
5000.0000 [IU] | Freq: Three times a day (TID) | INTRAMUSCULAR | Status: DC
  Administered 2017-04-12 – 2017-04-20 (×23): 5000 [IU] via SUBCUTANEOUS
  Filled 2017-04-12 (×23): qty 1

## 2017-04-12 NOTE — Progress Notes (Signed)
PROGRESS NOTE    Catherine Butler  WUJ:[id number] DOB: 1960-11-21 DOA: 04/11/2017 PCP: Loletta Specter, PA-C  Brief Narrative:   56 y.o. female, with past medical history significant for COPD, hypertension, anxiety and depression presenting today with 2 days history of urinary retention and stool incontinence. Patient complains of back pain with numbness in her lower extremities in addition to weakness and had multiple falls lately. Patient denies any burning urination, blood in the urine. No fever or chills. No history of flu or flu shot, lately. Patient reports history of upper extremity numbness in addition to lower extremity numbness for the last 1 year. In the emergency room she was also noted to have urinary retention with 1 L of urine catheterized.  MRI of the cervical spine showed no acute abnormality, mild degenerative spondylosis C3-C4-C5 and 6 without significant stenosis.  MRI of the lumbar spine no abnormal enhancement or acute abnormality.  MRI of the thoracic spine shows small disc protrusions at T7-T8 and 9 and old T4 compression fracture without bony retropulsion.  dw dr Melinda Crutch who spoke to IR to a LP to asses protein,cell count,differential and glucose.  Patient has a Foley catheter in place this was placed due to bladder distention.  Patient also has new onset urinary and bowel incontinence.  Assessment & Plan:   Active Problems:   Lower extremity weakness   New onset bilateral lower extremity weakness with tingling and numbness with urinary and bowel incontinence workup pending so far MRI of the entire spine is unremarkable.  Lumbar puncture ordered to be done by IR today or tomorrow.  Anxiety/depression-patient on high dose Seroquel 400 mg nightly??     DVT prophylaxis:heparin Code Status:full Family Communication:  none Disposition Plan: tbd Consultants: neuro  Procedures: none Antimicrobials: none Subjective:co weakness lower  extremity  Objective: Vitals:   04/11/17 1800 04/11/17 2026 04/12/17 0448 04/12/17 1517  BP: 111/77 103/65 98/64 (!) 129/92  Pulse:  (!) 116 (!) 103   Resp:  18 18 17   Temp:  98.5 F (36.9 C) 98 F (36.7 C) 98.2 F (36.8 C)  TempSrc:  Oral Oral Oral  SpO2:  98% 100% 100%  Weight:  82.1 kg (181 lb)    Height:  5\' 6"  (1.676 m)      Intake/Output Summary (Last 24 hours) at 04/12/17 1531 Last data filed at 04/12/17 1400  Gross per 24 hour  Intake              360 ml  Output             2450 ml  Net            -2090 ml   Filed Weights   04/11/17 1002 04/11/17 2026  Weight: 79.4 kg (175 lb) 82.1 kg (181 lb)    Examination:  General exam: Appears calm and comfortable  Respiratory system: Clear to auscultation. Respiratory effort normal. Cardiovascular system: S1 & S2 heard, RRR. No JVD, murmurs, rubs, gallops or clicks. No pedal edema. Gastrointestinal system: Abdomen is nondistended, soft and nontender. No organomegaly or masses felt. Normal bowel sounds heard. Central nervous system: Alert and oriented. No focal neurological deficits. Extremities: Symmetric 5 x 5 power. Skin: No rashes, lesions or ulcers Psychiatry: Judgement and insight appear normal. Mood & affect appropriate.     Data Reviewed: I have personally reviewed following labs and imaging studies  CBC:  Recent Labs Lab 04/11/17 1055  WBC 8.7  NEUTROABS 6.0  HGB 11.8*  HCT  34.8*  MCV 107.4*  PLT 239   Basic Metabolic Panel:  Recent Labs Lab 04/11/17 1055 04/12/17 0342  NA 134* 138  K 3.5 3.0*  CL 97* 103  CO2 21* 23  GLUCOSE 109* 95  BUN 6 5*  CREATININE 0.67 0.68  CALCIUM 8.4* 8.3*   GFR: Estimated Creatinine Clearance: 86.8 mL/min (by C-G formula based on SCr of 0.68 mg/dL). Liver Function Tests:  Recent Labs Lab 04/11/17 1055  AST 70*  ALT 43  ALKPHOS 240*  BILITOT 1.3*  PROT 6.2*  ALBUMIN 2.7*   No results for input(s): LIPASE, AMYLASE in the last 168 hours. No results  for input(s): AMMONIA in the last 168 hours. Coagulation Profile: No results for input(s): INR, PROTIME in the last 168 hours. Cardiac Enzymes: No results for input(s): CKTOTAL, CKMB, CKMBINDEX, TROPONINI in the last 168 hours. BNP (last 3 results) No results for input(s): PROBNP in the last 8760 hours. HbA1C: No results for input(s): HGBA1C in the last 72 hours. CBG: No results for input(s): GLUCAP in the last 168 hours. Lipid Profile: No results for input(s): CHOL, HDL, LDLCALC, TRIG, CHOLHDL, LDLDIRECT in the last 72 hours. Thyroid Function Tests: No results for input(s): TSH, T4TOTAL, FREET4, T3FREE, THYROIDAB in the last 72 hours. Anemia Panel:  Recent Labs  04/11/17 1750  VITAMINB12 830   Sepsis Labs: No results for input(s): PROCALCITON, LATICACIDVEN in the last 168 hours.  No results found for this or any previous visit (from the past 240 hour(s)).       Radiology Studies: Mr Thoracic Spine Wo Contrast  Result Date: 04/11/2017 CLINICAL DATA:  Urinary retention and bladder pain. Numbness from the waist down. The patient suffered 2 falls last night. Initial encounter. EXAM: MRI THORACIC SPINE WITHOUT CONTRAST TECHNIQUE: Multiplanar, multisequence MR imaging of the thoracic spine was performed. No intravenous contrast was administered. COMPARISON:  CT chest 06/18/2015. FINDINGS: Alignment: There is mild exaggeration of the normal thoracic kyphosis. Vertebrae: Remote anterior, superior endplate compression fracture of T4 with vertebral body height loss of approximately 25% is unchanged. Prominent Schmorl's node in the superior endplate of T11 is also unchanged. There is no acute fracture or worrisome marrow lesion. Cord:  Normal signal throughout.  Epidural lipomatosis noted. Paraspinal and other soft tissues: Hiatal hernia is noted. Disc levels: T4-5: Minimal disc bulge. No bony retropulsion off the superior endplate of T4. The central canal and foramina are widely patent.  T7-8: Very shallow left paracentral protrusion. The central canal and foramina are widely patent. T8-9: Very shallow right paracentral protrusion with some cephalad extension. The disc indents the ventral thecal sac but the central canal and foramina are widely patent. Intervertebral disc spaces are otherwise unremarkable. IMPRESSION: No acute abnormality or finding to explain the patient's symptoms. Mild degenerative disc disease most notable at T7-8 and T8-9 where there are small disc protrusions. The central canal and foramina are widely patent at all levels. Epidural lipomatosis. Very mild, remote superior endplate compression fracture of T4 without bony retropulsion. Electronically Signed   By: Drusilla Kanner M.D.   On: 04/11/2017 14:55   Mr Lumbar Spine Wo Contrast  Result Date: 04/11/2017 CLINICAL DATA:  Muscle weakness. EXAM: MRI LUMBAR SPINE WITHOUT CONTRAST TECHNIQUE: Multiplanar, multisequence MR imaging of the lumbar spine was performed. No intravenous contrast was administered. COMPARISON:  None. FINDINGS: Segmentation:  Standard. Alignment:  Physiologic. Vertebrae:  No fracture, evidence of discitis, or bone lesion. Conus medullaris: Extends to the L1 level and appears normal. Paraspinal and  other soft tissues: Negative. Disc levels: T12-L1:  Normal. L1-2:  Disc desiccation.  Otherwise normal. L2-3: Tiny disc bulge into the left neural foramen. Otherwise normal. L3-4: Normal disc. Minimal degenerative changes of the facet joints. L4-5:  Normal. L5-S1:  Normal. IMPRESSION: No significant abnormality of the lumbar spine. No spinal or foraminal stenosis. No significant facet joint disease. Distal spinal cord appears normal. Electronically Signed   By: Francene BoyersJames  Maxwell M.D.   On: 04/11/2017 14:09   Mr Lumbar Spine W Contrast  Result Date: 04/11/2017 CLINICAL DATA:  Initial evaluation for generalized muscle weakness. EXAM: MRI CERVICAL AND LUMBAR SPINE WITHOUT AND WITH CONTRAST TECHNIQUE:  Multiplanar and multiecho pulse sequences of the cervical spine, to include the craniocervical junction and cervicothoracic junction, and lumbar spine, were obtained without and with intravenous contrast. CONTRAST:  15mL MULTIHANCE GADOBENATE DIMEGLUMINE 529 MG/ML IV SOLN COMPARISON:  Prior noncontrast MRIs of the thoracic and lumbar spine performed earlier the same day. FINDINGS: MRI CERVICAL SPINE FINDINGS Alignment: Study degraded by motion artifact. Vertebral bodies normally aligned with preservation of the normal cervical lordosis. No listhesis or subluxation. Vertebrae: Height loss at the superior endplate of T4 again noted. Vertebral body heights otherwise maintained. No evidence for acute cervical spine fracture. Bone marrow signal intensity within normal limits. No discrete or worrisome osseous lesions. No abnormal enhancement. Cord: Signal intensity within the cervical spinal cord is normal. Cord caliber within normal limits. No abnormal enhancement. Posterior Fossa, vertebral arteries, paraspinal tissues: Paraspinal soft tissues demonstrate no acute abnormality. No abnormal prevertebral edema. Normal vascular flow voids noted within the vertebral arteries bilaterally. Disc levels: C2-C3: Unremarkable. C3-C4: Mild uncovertebral and facet hypertrophy. No canal stenosis. Mild bilateral C4 foraminal narrowing. C4-C5: Mild disc bulge. Right-sided uncovertebral hypertrophy. Resultant moderate right C5 foraminal stenosis. No canal narrowing. C5-C6: Mild disc bulge with uncovertebral hypertrophy. No canal stenosis. Mild right C6 foraminal narrowing. C6-C7:  Unremarkable. C7-T1:  Unremarkable. No made of a tiny right foraminal disc protrusion at T2-3 without stenosis. Remainder the visualized upper thoracic spine grossly unremarkable. MRI LUMBAR SPINE FINDINGS Segmentation: Limited post-contrast imaging of the lumbar spine was performed. Normal segmentation. Lowest well-formed disc labeled the L5-S1 level.  Alignment: Normal alignment with preservation of the normal lumbar lordosis. Vertebrae: Vertebral body heights maintained. No evidence for acute or chronic fracture. Bone marrow signal intensity within normal limits. No discrete osseous lesions. No abnormal enhancement. Conus medullaris: Extends to the L1 level and appears normal. No abnormal enhancement. Paraspinal and other soft tissues: Paraspinous soft tissues within normal limits. No abnormal enhancement. Visualized visceral structures within normal limits. Disc levels: Mild multilevel degenerative changes again noted, described on prior MRI from earlier same day. No significant stenosis or evidence for neural impingement. IMPRESSION: MRI CERVICAL SPINE IMPRESSION: 1. No acute abnormality within the cervical spine. No findings to explain patient's symptoms identified. 2. Mild degenerative spondylolysis at C3-4 through C5-6 without significant canal stenosis. Mild right greater than left foraminal narrowing at these levels as above. MRI LUMBAR SPINE IMPRESSION: No abnormal enhancement or other acute abnormality within the lumbar spine. Electronically Signed   By: Rise MuBenjamin  McClintock M.D.   On: 04/11/2017 20:33   Mr Cervical Spine W Or Wo Contrast  Result Date: 04/11/2017 CLINICAL DATA:  Initial evaluation for generalized muscle weakness. EXAM: MRI CERVICAL AND LUMBAR SPINE WITHOUT AND WITH CONTRAST TECHNIQUE: Multiplanar and multiecho pulse sequences of the cervical spine, to include the craniocervical junction and cervicothoracic junction, and lumbar spine, were obtained without and with intravenous  contrast. CONTRAST:  15mL MULTIHANCE GADOBENATE DIMEGLUMINE 529 MG/ML IV SOLN COMPARISON:  Prior noncontrast MRIs of the thoracic and lumbar spine performed earlier the same day. FINDINGS: MRI CERVICAL SPINE FINDINGS Alignment: Study degraded by motion artifact. Vertebral bodies normally aligned with preservation of the normal cervical lordosis. No listhesis  or subluxation. Vertebrae: Height loss at the superior endplate of T4 again noted. Vertebral body heights otherwise maintained. No evidence for acute cervical spine fracture. Bone marrow signal intensity within normal limits. No discrete or worrisome osseous lesions. No abnormal enhancement. Cord: Signal intensity within the cervical spinal cord is normal. Cord caliber within normal limits. No abnormal enhancement. Posterior Fossa, vertebral arteries, paraspinal tissues: Paraspinal soft tissues demonstrate no acute abnormality. No abnormal prevertebral edema. Normal vascular flow voids noted within the vertebral arteries bilaterally. Disc levels: C2-C3: Unremarkable. C3-C4: Mild uncovertebral and facet hypertrophy. No canal stenosis. Mild bilateral C4 foraminal narrowing. C4-C5: Mild disc bulge. Right-sided uncovertebral hypertrophy. Resultant moderate right C5 foraminal stenosis. No canal narrowing. C5-C6: Mild disc bulge with uncovertebral hypertrophy. No canal stenosis. Mild right C6 foraminal narrowing. C6-C7:  Unremarkable. C7-T1:  Unremarkable. No made of a tiny right foraminal disc protrusion at T2-3 without stenosis. Remainder the visualized upper thoracic spine grossly unremarkable. MRI LUMBAR SPINE FINDINGS Segmentation: Limited post-contrast imaging of the lumbar spine was performed. Normal segmentation. Lowest well-formed disc labeled the L5-S1 level. Alignment: Normal alignment with preservation of the normal lumbar lordosis. Vertebrae: Vertebral body heights maintained. No evidence for acute or chronic fracture. Bone marrow signal intensity within normal limits. No discrete osseous lesions. No abnormal enhancement. Conus medullaris: Extends to the L1 level and appears normal. No abnormal enhancement. Paraspinal and other soft tissues: Paraspinous soft tissues within normal limits. No abnormal enhancement. Visualized visceral structures within normal limits. Disc levels: Mild multilevel degenerative  changes again noted, described on prior MRI from earlier same day. No significant stenosis or evidence for neural impingement. IMPRESSION: MRI CERVICAL SPINE IMPRESSION: 1. No acute abnormality within the cervical spine. No findings to explain patient's symptoms identified. 2. Mild degenerative spondylolysis at C3-4 through C5-6 without significant canal stenosis. Mild right greater than left foraminal narrowing at these levels as above. MRI LUMBAR SPINE IMPRESSION: No abnormal enhancement or other acute abnormality within the lumbar spine. Electronically Signed   By: Rise Mu M.D.   On: 04/11/2017 20:33        Scheduled Meds: . chlorhexidine  15 mL Mouth Rinse BID  . feeding supplement (ENSURE ENLIVE)  237 mL Oral BID BM  . gabapentin  400 mg Oral QID  . heparin  5,000 Units Subcutaneous Q8H  . mouth rinse  15 mL Mouth Rinse q12n4p  . mirtazapine  15 mg Oral QHS  . multivitamin with minerals  1 tablet Oral Daily  . pantoprazole  40 mg Oral Daily  . QUEtiapine  400 mg Oral QHS  . sodium chloride flush  3 mL Intravenous Q12H   Continuous Infusions: . sodium chloride       LOS: 1 day        Alwyn Ren, MD [facility]   If 7PM-7AM, please contact night-coverage [email or web address] Password [email or web address] 04/12/2017, 3:31 PM

## 2017-04-12 NOTE — Progress Notes (Signed)
Pt nauseated- emesis of 150cc clear orange/brown liquid.  Had eaten [place] food that son had brought in.

## 2017-04-12 NOTE — Progress Notes (Signed)
Initial Nutrition Assessment  DOCUMENTATION CODES:   Non-severe (moderate) malnutrition in context of chronic illness  INTERVENTION:   -Continue Ensure Enlive po BID, each supplement provides 350 kcal and 20 grams of protein -Provide Multivitamin with minerals daily -Provided "GERD Nutrition Therapy" handout from [employer]  RD will continue to monitor  NUTRITION DIAGNOSIS:   Malnutrition (moderate) related to chronic illness, nausea (GERD) as evidenced by percent weight loss, energy intake < or equal to 75% for > or equal to 1 month.  GOAL:   Patient will meet greater than or equal to 90% of their needs  MONITOR:   PO intake, Supplement acceptance, Labs, Weight trends, I & O's  REASON FOR ASSESSMENT:   Malnutrition Screening Tool    ASSESSMENT:   56 y.o. female, with past medical history significant for COPD, hypertension, anxiety and depression presenting today with 2 days history of urinary retention and stool incontinence. Patient complains of back pain with numbness in her lower extremities in addition to weakness and had multiple falls lately. Patient denies any burning urination, blood in the urine. No fever or chills. No history of flu or flu shot, lately. Patient reports history of upper extremity numbness in addition to lower extremity numbness for the last 1 year. In the emergency room she was also noted to have urinary retention with 1 L of urine catheterized.  Pt in room eating lunch meal of fruit and cheese plate with a side salad. Pt reports poor appetite for years associated with chronic nausea from severe GERD symptoms. Pt states she  "picks" throughout the day. Pt states she did not eat any breakfast this morning but recorded PO intakes indicate 100% completion of a meal. Pt had an episode of emesis last night after consuming chinese food.  Provided and reviewed GERD handout with patient. Encouraged small ,frequent meals, exercise and  avoiding caffeine and fried, fatty foods.  Pt willing to try Ensure supplements.  Per chart review, pt has lost 15 lb since 7/14 (8% wt loss x 3 months, significant for time frame). Nutrition focused physical exam shows no sign of depletion of muscle mass or body fat.  Medications: Remeron tablet daily, IV Zofran PRN Labs reviewed: Low K Vitamin B-12 WNL Copper WNL  Diet Order:  Diet regular Room service appropriate? Yes; Fluid consistency: Thin  Skin:  Reviewed, no issues  Last BM:   PTA  Height:   Ht Readings from Last 1 Encounters:  04/11/17 5\' 6"  (1.676 m)    Weight:   Wt Readings from Last 1 Encounters:  04/11/17 181 lb (82.1 kg)    Ideal Body Weight:  59.1 kg  BMI:  Body mass index is 29.21 kg/m.  Estimated Nutritional Needs:   Kcal:  1900-2100  Protein:  80-90g  Fluid:  2L/day  EDUCATION NEEDS:   Education needs addressed (Provided GERD handout)  Tilda FrancoLindsey Mackenzy Eisenberg, MS, RD, LDN [facility] Clinical Dietitian Pager: [phone] After Hours Pager: [phone]

## 2017-04-12 NOTE — Progress Notes (Signed)
Resumed care of patient at 1500. Agree with previous RNs assessment.  Will continue to monitor.
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
## 2017-04-12 NOTE — Progress Notes (Signed)
Subjective: Patient reports relatively unchanged  Exam: Vitals:   04/12/17 0448 04/12/17 1517  BP: 98/64 (!) 129/92  Pulse: (!) 103   Resp: 18 17  Temp: 98 F (36.7 C) 98.2 F (36.8 C)  SpO2: 100% 100%   Gen: In bed, NAD Resp: non-labored breathing, no acute distress Abd: soft, nt  Neuro: MS: Awake, alert, interactive and appropriate CN: Pupils equal round reactive, extraocular movements intact Motor: Mild proximal weakness of lower extremities Sensory: Decreased in the medial aspects of the thighs bilaterally as well as circumferentially in the arms. DTR: 2+ and symmetric at the biceps, depressed at the knees, 1+ at the ankles  Pertinent Labs: B12, copper-normal  Impression: 56 year old female with progressive paresthesias/weakness.  Her exam continues to be confusing, possible etiology would be a central cord lesion not visible on MRI versus inflammatory polyradiculopathy.  If there was an increased protein without pleocytosis, I would consider adding P, though the present reflexes at the ankles and biceps would be most unusual.  One other consideration would be diabetic amyotrophy, though she does not have a history of diabetes.  An EMG would be most helpful, however unfortunately this is not available.  Recommendations: 1) A1c, ESR, CRP 2) LP for cells, glucose, protein 3) neurology will continue to follow  Roland Rack, MD [facility] [phone]  If 7pm- 7am, please page neurology on call as listed in [place].

## 2017-04-13 ENCOUNTER — Inpatient Hospital Stay (HOSPITAL_COMMUNITY): Payer: Medicaid Other

## 2017-04-13 DIAGNOSIS — R202 Paresthesia of skin: Secondary | ICD-10-CM

## 2017-04-13 LAB — CK: CK TOTAL: 21 U/L — AB (ref 38–234)

## 2017-04-13 LAB — CSF CELL COUNT WITH DIFFERENTIAL
RBC Count, CSF: 252 /mm3 — ABNORMAL HIGH
Tube #: 1
WBC CSF: 3 /mm3 (ref 0–5)

## 2017-04-13 LAB — HEMOGLOBIN A1C
Hgb A1c MFr Bld: 4.8 % (ref 4.8–5.6)
MEAN PLASMA GLUCOSE: 91.06 mg/dL

## 2017-04-13 LAB — GLUCOSE, CSF: Glucose, CSF: 74 mg/dL — ABNORMAL HIGH (ref 40–70)

## 2017-04-13 LAB — PROTEIN, CSF: TOTAL PROTEIN, CSF: 34 mg/dL (ref 15–45)

## 2017-04-13 LAB — GRAM STAIN

## 2017-04-13 LAB — TSH: TSH: 0.862 u[IU]/mL (ref 0.350–4.500)

## 2017-04-13 LAB — MAGNESIUM: MAGNESIUM: 1.5 mg/dL — AB (ref 1.7–2.4)

## 2017-04-13 MED ORDER — MAGNESIUM SULFATE 2 GM/50ML IV SOLN
2.0000 g | Freq: Once | INTRAVENOUS | Status: AC
  Administered 2017-04-13: 2 g via INTRAVENOUS
  Filled 2017-04-13 (×2): qty 50

## 2017-04-13 MED ORDER — LIDOCAINE HCL 1 % IJ SOLN
INTRAMUSCULAR | Status: AC
  Filled 2017-04-13: qty 20

## 2017-04-13 MED ORDER — POTASSIUM CHLORIDE IN NACL 40-0.9 MEQ/L-% IV SOLN
INTRAVENOUS | Status: DC
  Administered 2017-04-13 – 2017-04-16 (×3): 75 mL/h via INTRAVENOUS
  Filled 2017-04-13 (×5): qty 1000

## 2017-04-13 MED ORDER — POTASSIUM CHLORIDE CRYS ER 20 MEQ PO TBCR
40.0000 meq | EXTENDED_RELEASE_TABLET | Freq: Two times a day (BID) | ORAL | Status: AC
  Administered 2017-04-13 (×2): 40 meq via ORAL
  Filled 2017-04-13 (×2): qty 2

## 2017-04-13 MED ORDER — IMMUNE GLOBULIN (HUMAN) 5 GM/50ML IV SOLN
400.0000 mg/kg | INTRAVENOUS | Status: DC
  Administered 2017-04-13: 35 g via INTRAVENOUS
  Filled 2017-04-13: qty 50

## 2017-04-13 NOTE — Progress Notes (Signed)
PROGRESS NOTE    Catherine Butler  JKK:[id number] DOB: 11-05-1960 DOA: 04/11/2017 PCP: Clent Demark, PA-C  Brief Narrative:   56 y.o. female, with past medical history significant for COPD, hypertension, anxiety and depression presenting today with 2 days history of urinary retention and stool incontinence. Patient complains of back pain with numbness in her lower extremities in addition to weakness and had multiple falls lately. Patient denies any burning urination, blood in the urine. No fever or chills. No history of flu or flu shot, lately. Patient reports history of upper extremity numbness in addition to lower extremity numbness for the last 1 year. In the emergency room she was also noted to have urinary retention with 1 L of urine catheterized.  MRI of the cervical spine showed no acute abnormality, mild degenerative spondylosis C3-C4-C5 and 6 without significant stenosis.  MRI of the lumbar spine no abnormal enhancement or acute abnormality.  MRI of the thoracic spine shows small disc protrusions at T7-T8 and 9 and old T4 compression fracture without bony retropulsion.   Neurology Dr. Roque Cash was consulted who recommended IR to perform LP to asses protein,cell count,differential and glucose.  Patient has a Foley catheter in place this was placed due to bladder distention.  Patient also has new onset urinary and bowel incontinence.  Assessment & Plan:   Active Problems:   Lower extremity weakness   Paresthesia  Paresthesias/weakness Continue to endorse weakness of B/L LE   with tingling and numbness with urinary and bowel incontinence workup pending so far MRI of the entire spine is unremarkable.  Lumbar puncture   done by IR CSF protein within normal limits    , may also need EMG, CRP 2.6, ESR 12,   vitamin B-12 696 , TSH,   CK 21  COPD-appears to be stable, continue as needed Ventolin  Hypertension- currently on no medications at home  Gastroesophageal reflux  disease  Asthma-stable  Dyslipidemia-continue Lipitor, most recent  lipid panel showed triglycerides to be 397, will repeat  Anxiety/depression- on Seroquel which will be continued  Hypokalemia-replete,  Magnesium 1.5, will replete      DVT prophylaxis:heparin Code Status:full Family Communication:  none Disposition Plan: 1-2 days  Consultants: neuro  Procedures: none Antimicrobials: none    Objective: Vitals:   04/12/17 0448 04/12/17 1517 04/12/17 2039 04/13/17 0445  BP: 98/64 (!) 129/92 (!) 134/98 (!) 145/82  Pulse: (!) 103  (!) 110 (!) 112  Resp: _0 Temp: 98 F (36.7 C) 98.2 F (36.8 C) 98.7 F (37.1 C) 98.4 F (36.9 C)  TempSrc: Oral Oral Oral Oral  SpO2: 100% 100% 100% 100%  Weight:      Height:        Intake/Output Summary (Last 24 hours) at 04/13/17 1303 Last data filed at 04/13/17 1000  Gross per 24 hour  Intake              960 ml  Output             3450 ml  Net            -2490 ml   Filed Weights   04/11/17 1002 04/11/17 2026  Weight: 79.4 kg (175 lb) 82.1 kg (181 lb)    Examination:  General exam: Appears calm and comfortable  Respiratory system: Clear to auscultation. Respiratory effort normal. Cardiovascular system: S1 & S2 heard, RRR. No JVD, murmurs, rubs, gallops or clicks. No pedal edema. Gastrointestinal system: Abdomen is nondistended, soft  and nontender. No organomegaly or masses felt. Normal bowel sounds heard. Central nervous system: Alert and oriented. No focal neurological deficits. Extremities: Symmetric 5 x 5 power. Skin: No rashes, lesions or ulcers Psychiatry: Judgement and insight appear normal. Mood & affect appropriate.     Data Reviewed: I have personally reviewed following labs and imaging studies  CBC:  Recent Labs Lab 04/11/17 1055  WBC 8.7  NEUTROABS 6.0  HGB 11.8*  HCT 34.8*  MCV 107.4*  PLT 341   Basic Metabolic Panel:  Recent Labs Lab 04/11/17 1055 04/12/17 0342  NA 134* 138  K 3.5  3.0*  CL 97* 103  CO2 21* 23  GLUCOSE 109* 95  BUN 6 5*  CREATININE 0.67 0.68  CALCIUM 8.4* 8.3*   GFR: Estimated Creatinine Clearance: 86.8 mL/min (by C-G formula based on SCr of 0.68 mg/dL). Liver Function Tests:  Recent Labs Lab 04/11/17 1055  AST 70*  ALT 43  ALKPHOS 240*  BILITOT 1.3*  PROT 6.2*  ALBUMIN 2.7*   No results for input(s): LIPASE, AMYLASE in the last 168 hours. No results for input(s): AMMONIA in the last 168 hours. Coagulation Profile: No results for input(s): INR, PROTIME in the last 168 hours. Cardiac Enzymes: No results for input(s): CKTOTAL, CKMB, CKMBINDEX, TROPONINI in the last 168 hours. BNP (last 3 results) No results for input(s): PROBNP in the last 8760 hours. HbA1C: No results for input(s): HGBA1C in the last 72 hours. CBG: No results for input(s): GLUCAP in the last 168 hours. Lipid Profile: No results for input(s): CHOL, HDL, LDLCALC, TRIG, CHOLHDL, LDLDIRECT in the last 72 hours. Thyroid Function Tests: No results for input(s): TSH, T4TOTAL, FREET4, T3FREE, THYROIDAB in the last 72 hours. Anemia Panel:  Recent Labs  04/11/17 1750 04/12/17 1624  VITAMINB12 830 696   Sepsis Labs: No results for input(s): PROCALCITON, LATICACIDVEN in the last 168 hours.  No results found for this or any previous visit (from the past 240 hour(s)).       Radiology Studies: Mr Thoracic Spine Wo Contrast  Result Date: 04/11/2017 CLINICAL DATA:  Urinary retention and bladder pain. Numbness from the waist down. The patient suffered 2 falls last night. Initial encounter. EXAM: MRI THORACIC SPINE WITHOUT CONTRAST TECHNIQUE: Multiplanar, multisequence MR imaging of the thoracic spine was performed. No intravenous contrast was administered. COMPARISON:  CT chest 06/18/2015. FINDINGS: Alignment: There is mild exaggeration of the normal thoracic kyphosis. Vertebrae: Remote anterior, superior endplate compression fracture of T4 with vertebral body height  loss of approximately 25% is unchanged. Prominent Schmorl's node in the superior endplate of P37 is also unchanged. There is no acute fracture or worrisome marrow lesion. Cord:  Normal signal throughout.  Epidural lipomatosis noted. Paraspinal and other soft tissues: Hiatal hernia is noted. Disc levels: T4-5: Minimal disc bulge. No bony retropulsion off the superior endplate of T4. The central canal and foramina are widely patent. T7-8: Very shallow left paracentral protrusion. The central canal and foramina are widely patent. T8-9: Very shallow right paracentral protrusion with some cephalad extension. The disc indents the ventral thecal sac but the central canal and foramina are widely patent. Intervertebral disc spaces are otherwise unremarkable. IMPRESSION: No acute abnormality or finding to explain the patient's symptoms. Mild degenerative disc disease most notable at T7-8 and T8-9 where there are small disc protrusions. The central canal and foramina are widely patent at all levels. Epidural lipomatosis. Very mild, remote superior endplate compression fracture of T4 without bony retropulsion. Electronically Signed  By: Inge Rise M.D.   On: 04/11/2017 14:55   Mr Lumbar Spine Wo Contrast  Result Date: 04/11/2017 CLINICAL DATA:  Muscle weakness. EXAM: MRI LUMBAR SPINE WITHOUT CONTRAST TECHNIQUE: Multiplanar, multisequence MR imaging of the lumbar spine was performed. No intravenous contrast was administered. COMPARISON:  None. FINDINGS: Segmentation:  Standard. Alignment:  Physiologic. Vertebrae:  No fracture, evidence of discitis, or bone lesion. Conus medullaris: Extends to the L1 level and appears normal. Paraspinal and other soft tissues: Negative. Disc levels: T12-L1:  Normal. L1-2:  Disc desiccation.  Otherwise normal. L2-3: Tiny disc bulge into the left neural foramen. Otherwise normal. L3-4: Normal disc. Minimal degenerative changes of the facet joints. L4-5:  Normal. L5-S1:  Normal.  IMPRESSION: No significant abnormality of the lumbar spine. No spinal or foraminal stenosis. No significant facet joint disease. Distal spinal cord appears normal. Electronically Signed   By: Lorriane Shire M.D.   On: 04/11/2017 14:09   Mr Lumbar Spine W Contrast  Result Date: 04/11/2017 CLINICAL DATA:  Initial evaluation for generalized muscle weakness. EXAM: MRI CERVICAL AND LUMBAR SPINE WITHOUT AND WITH CONTRAST TECHNIQUE: Multiplanar and multiecho pulse sequences of the cervical spine, to include the craniocervical junction and cervicothoracic junction, and lumbar spine, were obtained without and with intravenous contrast. CONTRAST:  37m MULTIHANCE GADOBENATE DIMEGLUMINE 529 MG/ML IV SOLN COMPARISON:  Prior noncontrast MRIs of the thoracic and lumbar spine performed earlier the same day. FINDINGS: MRI CERVICAL SPINE FINDINGS Alignment: Study degraded by motion artifact. Vertebral bodies normally aligned with preservation of the normal cervical lordosis. No listhesis or subluxation. Vertebrae: Height loss at the superior endplate of T4 again noted. Vertebral body heights otherwise maintained. No evidence for acute cervical spine fracture. Bone marrow signal intensity within normal limits. No discrete or worrisome osseous lesions. No abnormal enhancement. Cord: Signal intensity within the cervical spinal cord is normal. Cord caliber within normal limits. No abnormal enhancement. Posterior Fossa, vertebral arteries, paraspinal tissues: Paraspinal soft tissues demonstrate no acute abnormality. No abnormal prevertebral edema. Normal vascular flow voids noted within the vertebral arteries bilaterally. Disc levels: C2-C3: Unremarkable. C3-C4: Mild uncovertebral and facet hypertrophy. No canal stenosis. Mild bilateral C4 foraminal narrowing. C4-C5: Mild disc bulge. Right-sided uncovertebral hypertrophy. Resultant moderate right C5 foraminal stenosis. No canal narrowing. C5-C6: Mild disc bulge with uncovertebral  hypertrophy. No canal stenosis. Mild right C6 foraminal narrowing. C6-C7:  Unremarkable. C7-T1:  Unremarkable. No made of a tiny right foraminal disc protrusion at T2-3 without stenosis. Remainder the visualized upper thoracic spine grossly unremarkable. MRI LUMBAR SPINE FINDINGS Segmentation: Limited post-contrast imaging of the lumbar spine was performed. Normal segmentation. Lowest well-formed disc labeled the L5-S1 level. Alignment: Normal alignment with preservation of the normal lumbar lordosis. Vertebrae: Vertebral body heights maintained. No evidence for acute or chronic fracture. Bone marrow signal intensity within normal limits. No discrete osseous lesions. No abnormal enhancement. Conus medullaris: Extends to the L1 level and appears normal. No abnormal enhancement. Paraspinal and other soft tissues: Paraspinous soft tissues within normal limits. No abnormal enhancement. Visualized visceral structures within normal limits. Disc levels: Mild multilevel degenerative changes again noted, described on prior MRI from earlier same day. No significant stenosis or evidence for neural impingement. IMPRESSION: MRI CERVICAL SPINE IMPRESSION: 1. No acute abnormality within the cervical spine. No findings to explain patient's symptoms identified. 2. Mild degenerative spondylolysis at C3-4 through C5-6 without significant canal stenosis. Mild right greater than left foraminal narrowing at these levels as above. MRI LUMBAR SPINE IMPRESSION: No abnormal enhancement or  other acute abnormality within the lumbar spine. Electronically Signed   By: Jeannine Boga M.D.   On: 04/11/2017 20:33   Mr Cervical Spine W Or Wo Contrast  Result Date: 04/11/2017 CLINICAL DATA:  Initial evaluation for generalized muscle weakness. EXAM: MRI CERVICAL AND LUMBAR SPINE WITHOUT AND WITH CONTRAST TECHNIQUE: Multiplanar and multiecho pulse sequences of the cervical spine, to include the craniocervical junction and cervicothoracic  junction, and lumbar spine, were obtained without and with intravenous contrast. CONTRAST:  68m MULTIHANCE GADOBENATE DIMEGLUMINE 529 MG/ML IV SOLN COMPARISON:  Prior noncontrast MRIs of the thoracic and lumbar spine performed earlier the same day. FINDINGS: MRI CERVICAL SPINE FINDINGS Alignment: Study degraded by motion artifact. Vertebral bodies normally aligned with preservation of the normal cervical lordosis. No listhesis or subluxation. Vertebrae: Height loss at the superior endplate of T4 again noted. Vertebral body heights otherwise maintained. No evidence for acute cervical spine fracture. Bone marrow signal intensity within normal limits. No discrete or worrisome osseous lesions. No abnormal enhancement. Cord: Signal intensity within the cervical spinal cord is normal. Cord caliber within normal limits. No abnormal enhancement. Posterior Fossa, vertebral arteries, paraspinal tissues: Paraspinal soft tissues demonstrate no acute abnormality. No abnormal prevertebral edema. Normal vascular flow voids noted within the vertebral arteries bilaterally. Disc levels: C2-C3: Unremarkable. C3-C4: Mild uncovertebral and facet hypertrophy. No canal stenosis. Mild bilateral C4 foraminal narrowing. C4-C5: Mild disc bulge. Right-sided uncovertebral hypertrophy. Resultant moderate right C5 foraminal stenosis. No canal narrowing. C5-C6: Mild disc bulge with uncovertebral hypertrophy. No canal stenosis. Mild right C6 foraminal narrowing. C6-C7:  Unremarkable. C7-T1:  Unremarkable. No made of a tiny right foraminal disc protrusion at T2-3 without stenosis. Remainder the visualized upper thoracic spine grossly unremarkable. MRI LUMBAR SPINE FINDINGS Segmentation: Limited post-contrast imaging of the lumbar spine was performed. Normal segmentation. Lowest well-formed disc labeled the L5-S1 level. Alignment: Normal alignment with preservation of the normal lumbar lordosis. Vertebrae: Vertebral body heights maintained. No  evidence for acute or chronic fracture. Bone marrow signal intensity within normal limits. No discrete osseous lesions. No abnormal enhancement. Conus medullaris: Extends to the L1 level and appears normal. No abnormal enhancement. Paraspinal and other soft tissues: Paraspinous soft tissues within normal limits. No abnormal enhancement. Visualized visceral structures within normal limits. Disc levels: Mild multilevel degenerative changes again noted, described on prior MRI from earlier same day. No significant stenosis or evidence for neural impingement. IMPRESSION: MRI CERVICAL SPINE IMPRESSION: 1. No acute abnormality within the cervical spine. No findings to explain patient's symptoms identified. 2. Mild degenerative spondylolysis at C3-4 through C5-6 without significant canal stenosis. Mild right greater than left foraminal narrowing at these levels as above. MRI LUMBAR SPINE IMPRESSION: No abnormal enhancement or other acute abnormality within the lumbar spine. Electronically Signed   By: BJeannine BogaM.D.   On: 04/11/2017 20:33   Dg Fluoro Guide Lumbar Puncture  Result Date: 04/13/2017 CLINICAL DATA:  Inpatient. Unexplained paresthesias and lower extremity weakness. EXAM: DIAGNOSTIC LUMBAR PUNCTURE UNDER FLUOROSCOPIC GUIDANCE FLUOROSCOPY TIME:  Fluoroscopy Time:  0 minutes 20 seconds Radiation Exposure Index (if provided by the fluoroscopic device): 8.8 mGy Number of Acquired Spot Images: 0 PROCEDURE: Informed consent was obtained from the patient prior to the procedure, including potential complications of headache, allergy, and pain. With the patient prone, the lower back was prepped with Betadine. 1% Lidocaine was used for local anesthesia. Lumbar puncture was performed at the left L2-3 level via interlaminar approach using a 20 gauge needle with return of clear CSF with  an opening pressure of 11 cm water. 8.5 ml of CSF were obtained for laboratory studies. The patient tolerated the procedure  well and there were no apparent complications. IMPRESSION: Technically successful diagnostic lumbar puncture under fluoroscopic guidance. Electronically Signed   By: Ilona Sorrel M.D.   On: 04/13/2017 12:30        Scheduled Meds: . chlorhexidine  15 mL Mouth Rinse BID  . feeding supplement (ENSURE ENLIVE)  237 mL Oral BID BM  . gabapentin  400 mg Oral QID  . heparin  5,000 Units Subcutaneous Q8H  . lidocaine      . mouth rinse  15 mL Mouth Rinse q12n4p  . mirtazapine  15 mg Oral QHS  . multivitamin with minerals  1 tablet Oral Daily  . nicotine  21 mg Transdermal Daily  . pantoprazole  40 mg Oral Daily  . potassium chloride  40 mEq Oral BID  . QUEtiapine  400 mg Oral QHS  . sodium chloride flush  3 mL Intravenous Q12H   Continuous Infusions: . sodium chloride       LOS: 2 days        Reyne Dumas, MD [facility]   If 7PM-7AM, please contact night-coverage [email or web address] Password [email or web address] 04/13/2017, 1:03 PM

## 2017-04-13 NOTE — Progress Notes (Signed)
PT Cancellation Note  Patient Details Name: Patriciaann ClanKelly D Sortino MRN: [id number] DOB: 04/07/1961   Cancelled Treatment:    Reason Eval/Treat Not Completed: Medical issues which prohibited therapy (pt is s/p lumbar puncture, is on bedrest. Will follow. )   Tamala SerUhlenberg, Lourdes Manning Kistler 04/13/2017, 12:38 PM  [phone]

## 2017-04-13 NOTE — Procedures (Signed)
DIAGNOSTIC LUMBAR PUNCTURE UNDER FLUOROSCOPIC GUIDANCE  Informed consent was obtained from the patient prior to the procedure, including potential complications of headache, allergy, and pain. With the patient prone, the lower back was prepped with Betadine. 1% Lidocaine was used for local anesthesia. Lumbar puncture was performed at the [facility][left L2-3] level via interlaminar approach using a [20] gauge needle with return of [clear] CSF with an opening pressure of [11] cm water. [8.5] ml of CSF were obtained for laboratory studies. The patient tolerated the procedure well and there were no apparent complications.

## 2017-04-13 NOTE — Progress Notes (Signed)
Subjective: Patient reports slightly worse.   Exam: Vitals:   04/13/17 0445 04/13/17 1313  BP: (!) 145/82 (!) 132/94  Pulse: (!) 112 (!) 113  Resp: 18 18  Temp: 98.4 F (36.9 C) 97.8 F (36.6 C)  SpO2: 100% 96%   Gen: In bed, NAD Resp: non-labored breathing, no acute distress Abd: soft, nt  Neuro: MS: Awake, alert, interactive and appropriate CN: Pupils equal round reactive, extraocular movements intact Motor: Mild proximal weakness of lower extremities and  4/5 upper extremities.  Sensory: Decreased in the medial aspects of the thighs bilaterally as well as circumferentially in the arms. DTR: 2+ and symmetric at the biceps, absent at the knees, 1+ at the ankles  Pertinent Labs: B12, copper-normal ESR-12  Impression: 56 year old female with progressive paresthesias/weakness.  Overall, with absent reflexes,  and distribution of numbness, I think that a peripheral process is more likely than a central one.  There is no evidence of pleocytosis on LP.  Though the protein is not elevated, I do not think that this excludes Guillain-Barr syndrome.  Diabetic lumbosacral plexopathy would be another consideration, but this would not explain her upper extremity sensory change.  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  With absent reflexes and her urinary retention, I do think there is objective evidence of deficit and with other etiologies ruled out, I would favor treating for Guyon Barr at this time.  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

## 2017-04-14 DIAGNOSIS — R339 Retention of urine, unspecified: Secondary | ICD-10-CM

## 2017-04-14 DIAGNOSIS — J449 Chronic obstructive pulmonary disease, unspecified: Secondary | ICD-10-CM

## 2017-04-14 DIAGNOSIS — I10 Essential (primary) hypertension: Secondary | ICD-10-CM

## 2017-04-14 LAB — COMPREHENSIVE METABOLIC PANEL
ALBUMIN: 2.2 g/dL — AB (ref 3.5–5.0)
ALT: 39 U/L (ref 14–54)
ANION GAP: 11 (ref 5–15)
AST: 77 U/L — ABNORMAL HIGH (ref 15–41)
Alkaline Phosphatase: 211 U/L — ABNORMAL HIGH (ref 38–126)
BILIRUBIN TOTAL: 1.3 mg/dL — AB (ref 0.3–1.2)
BUN: 6 mg/dL (ref 6–20)
CHLORIDE: 105 mmol/L (ref 101–111)
CO2: 21 mmol/L — AB (ref 22–32)
Calcium: 8.2 mg/dL — ABNORMAL LOW (ref 8.9–10.3)
Creatinine, Ser: 0.65 mg/dL (ref 0.44–1.00)
GFR calc non Af Amer: >60 mL/min (ref >60)
GLUCOSE: 105 mg/dL — AB (ref 65–99)
POTASSIUM: 4.3 mmol/L (ref 3.5–5.1)
SODIUM: 137 mmol/L (ref 135–145)
Total Protein: 6.3 g/dL — ABNORMAL LOW (ref 6.5–8.1)

## 2017-04-14 LAB — CBC
HEMATOCRIT: 29 % — AB (ref 36.0–46.0)
Hemoglobin: 10 g/dL — ABNORMAL LOW (ref 12.0–15.0)
MCH: 37.5 pg — AB (ref 26.0–34.0)
MCHC: 34.5 g/dL (ref 30.0–36.0)
MCV: 108.6 fL — AB (ref 78.0–100.0)
Platelets: 172 10*3/uL (ref 150–400)
RBC: 2.67 MIL/uL — ABNORMAL LOW (ref 3.87–5.11)
RDW: 14.3 % (ref 11.5–15.5)
WBC: 7.1 10*3/uL (ref 4.0–10.5)

## 2017-04-14 LAB — HEMOGLOBIN A1C
Hgb A1c MFr Bld: 4.8 % (ref 4.8–5.6)
Mean Plasma Glucose: 91 mg/dL

## 2017-04-14 LAB — LIPID PANEL
CHOL/HDL RATIO: 13.4 ratio
CHOLESTEROL: 227 mg/dL — AB (ref 0–200)
HDL: 17 mg/dL — ABNORMAL LOW (ref >40)
LDL Cholesterol: UNDETERMINED mg/dL (ref 0–99)
Triglycerides: 600 mg/dL — ABNORMAL HIGH (ref <150)
VLDL: UNDETERMINED mg/dL (ref 0–40)

## 2017-04-14 LAB — COPPER, SERUM: Copper: 79 ug/dL (ref 72–166)

## 2017-04-14 MED ORDER — OXYCODONE HCL 5 MG PO TABS
5.0000 mg | ORAL_TABLET | ORAL | Status: DC | PRN
  Administered 2017-04-14 – 2017-04-16 (×13): 5 mg via ORAL
  Filled 2017-04-14 (×13): qty 1

## 2017-04-14 MED ORDER — OXYCODONE HCL 5 MG PO TABS: 5.0000 mg | ORAL_TABLET | ORAL | Status: DC | PRN

## 2017-04-14 MED ORDER — ATORVASTATIN CALCIUM 40 MG PO TABS
40.0000 mg | ORAL_TABLET | Freq: Every day | ORAL | Status: DC
  Administered 2017-04-14 – 2017-04-18 (×5): 40 mg via ORAL
  Filled 2017-04-14 (×6): qty 1

## 2017-04-14 MED ORDER — IMMUNE GLOBULIN (HUMAN) 5 GM/50ML IV SOLN
400.0000 mg/kg | INTRAVENOUS | Status: AC
  Administered 2017-04-14 – 2017-04-17 (×4): 35 g via INTRAVENOUS
  Filled 2017-04-14 (×4): qty 50

## 2017-04-14 NOTE — Progress Notes (Signed)
[facility] PROGRESS NOTE    Progress Note  Catherine Butler  OZY:[id number] DOB: August 13, 1960 DOA: 04/11/2017 PCP: Clent Demark, PA-C     Brief Narrative:   Catherine Butler is an 56 y.o. female past medical history of significant COPD, hypertension presents with 2 days of urinary retention and stool incontinence, patient has been complaining of back pain with numbness in her lower extremity in addition to weakness and multiple falls, in the ED a Foley was placed and 1 L of urine came out, and MRI of the cervical spine show no acute abnormality, an MRI of the lumbar no acute abnormalities spine and thoracic spine show small disc protrusion T7-T8 with an old compression fracture without retropulsion, neurology was consulted who recommended a lumbar puncture.  Assessment/Plan:   Lower extremity weakness/Paresthesia Multiple MRIs without a definite conclusion. 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 CRP was 2.6, ESR was 12, B-12 was 696, CK was 21, A1c was 4.8. TSH of 0.8.  COPD: No changes made to her medication.  Essential hypertension: continue current medication.  Anxiety and depression: Continue cervical.  Hyperlipidemia: Continue Lipitor.  DVT prophylaxis: Lovenox Family Communication:none Disposition Plan/Barrier to D/C: home in 4-5 days Code Status:     Code Status Orders        Start     Ordered   04/11/17 1758  Full code  Continuous     04/11/17 1757    Code Status History    Date Active Date Inactive Code Status Order ID Comments User Context   01/01/2017  9:01 PM 01/03/2017  6:25 PM Full Code [id number]  Ivor Costa, MD ED   06/16/2015  7:34 PM 06/18/2015  8:38 PM Full Code [id number]  Doree Albee, MD ED   02/08/2013 11:42 AM 02/09/2013  2:56 PM Full Code [id number]  Hyman Bible, PA-C ED   09/13/2012  2:22 AM 09/13/2012 10:48 AM Full Code [id number]  Ezequiel Essex, MD ED         IV Access:    Peripheral IV   Procedures and diagnostic studies:   Dg Fluoro Guide Lumbar Puncture  Result Date: 04/13/2017 CLINICAL DATA:  Inpatient. Unexplained paresthesias and lower extremity weakness. EXAM: DIAGNOSTIC LUMBAR PUNCTURE UNDER FLUOROSCOPIC GUIDANCE FLUOROSCOPY TIME:  Fluoroscopy Time:  0 minutes 20 seconds Radiation Exposure Index (if provided by the fluoroscopic device): 8.8 mGy Number of Acquired Spot Images: 0 PROCEDURE: Informed consent was obtained from the patient prior to the procedure, including potential complications of headache, allergy, and pain. With the patient prone, the lower back was prepped with Betadine. 1% Lidocaine was used for local anesthesia. Lumbar puncture was performed at the left L2-3 level via interlaminar approach using a 20 gauge needle with return of clear CSF with an opening pressure of 11 cm water. 8.5 ml of CSF were obtained for laboratory studies. The patient tolerated the procedure well and there were no apparent complications. IMPRESSION: Technically successful diagnostic lumbar puncture under fluoroscopic guidance. Electronically Signed   By: Ilona Sorrel M.D.   On: 04/13/2017 12:30     Medical Consultants:    None.  Anti-Infectives:   None  Subjective:    Catherine Butler she relates her pain is not controlled.  Objective:    Vitals:   04/13/17 2240 04/13/17 2255 04/14/17 0014 04/14/17 0442  BP: 113/82 114/81 125/89 129/77  Pulse: (!) 120 (!) 119 (!) 111 (!) 107  Resp: _0 20  Temp: 98.6 F (37 C) 98 F (36.7 C) 97.8 F (36.6 C) 98.6 F (37 C)  TempSrc: Oral Oral Oral Oral  SpO2:  98% 98% 97%  Weight:      Height:        Intake/Output Summary (Last 24 hours) at 04/14/17 0814 Last data filed at 04/14/17 0600  Gross per 24 hour  Intake           1197.5 ml  Output             2200 ml  Net          -1002.5 ml   Filed Weights   04/11/17 1002 04/11/17 2026  Weight: 79.4 kg (175 lb) 82.1 kg (181  lb)    Exam: General exam: In no acute distress Respiratory system: Good air movement clear to auscultation. Cardiovascular system: S1 & S2 heard, RRR. Gastrointestinal system: Abdomen is nondistended, soft and nontender.  Central nervous system: Awake alert and oriented 3, with one of the 5 muscle strength of the lower extremities 0 deep tendon reflexes Extremities: No pedal edema. Skin: No rashes, lesions or ulcers   Data Reviewed:    Labs: Basic Metabolic Panel:  Recent Labs Lab 04/11/17 1055 04/12/17 0342 04/13/17 1340 04/14/17 0422  NA 134* 138  --  137  K 3.5 3.0*  --  4.3  CL 97* 103  --  105  CO2 21* 23  --  21*  GLUCOSE 109* 95  --  105*  BUN 6 5*  --  6  CREATININE 0.67 0.68  --  0.65  CALCIUM 8.4* 8.3*  --  8.2*  MG  --   --  1.5*  --    GFR Estimated Creatinine Clearance: 86.8 mL/min (by C-G formula based on SCr of 0.65 mg/dL). Liver Function Tests:  Recent Labs Lab 04/11/17 1055 04/14/17 0422  AST 70* 77*  ALT 43 39  ALKPHOS 240* 211*  BILITOT 1.3* 1.3*  PROT 6.2* 6.3*  ALBUMIN 2.7* 2.2*   No results for input(s): LIPASE, AMYLASE in the last 168 hours. No results for input(s): AMMONIA in the last 168 hours. Coagulation profile No results for input(s): INR, PROTIME in the last 168 hours.  CBC:  Recent Labs Lab 04/11/17 1055 04/14/17 0422  WBC 8.7 7.1  NEUTROABS 6.0  --   HGB 11.8* 10.0*  HCT 34.8* 29.0*  MCV 107.4* 108.6*  PLT 239 172   Cardiac Enzymes:  Recent Labs Lab 04/13/17 1340  CKTOTAL 21*   BNP (last 3 results) No results for input(s): PROBNP in the last 8760 hours. CBG: No results for input(s): GLUCAP in the last 168 hours. D-Dimer: No results for input(s): DDIMER in the last 72 hours. Hgb A1c:  Recent Labs  04/12/17 1949 04/13/17 1340  HGBA1C 4.8 4.8   Lipid Profile: No results for input(s): CHOL, HDL, LDLCALC, TRIG, CHOLHDL, LDLDIRECT in the last 72 hours. Thyroid function studies:  Recent Labs   04/13/17 1340  TSH 0.862   Anemia work up:  Recent Labs  04/11/17 1750 04/12/17 1624  VITAMINB12 830 696   Sepsis Labs:  Recent Labs Lab 04/11/17 1055 04/14/17 0422  WBC 8.7 7.1   Microbiology Recent Results (from the past 240 hour(s))  Gram stain     Status: None   Collection Time: 04/13/17 11:50 AM  Result Value Ref Range Status   Specimen Description CSF  Final   Special Requests NONE  Final   Gram Stain   Final  WBC PRESENT,BOTH PMN AND MONONUCLEAR NO ORGANISMS SEEN CYTOSPIN Gram Stain Report Called to,Read Back By and Verified With: BODKIN,K. RN _0  10.24.18 BY NMCCOY    Report Status 04/13/2017 FINAL  Final     Medications:   . chlorhexidine  15 mL Mouth Rinse BID  . feeding supplement (ENSURE ENLIVE)  237 mL Oral BID BM  . gabapentin  400 mg Oral QID  . heparin  5,000 Units Subcutaneous Q8H  . Immune Globulin 10%  400 mg/kg Intravenous Q24 Hr x 5  . mouth rinse  15 mL Mouth Rinse q12n4p  . mirtazapine  15 mg Oral QHS  . multivitamin with minerals  1 tablet Oral Daily  . nicotine  21 mg Transdermal Daily  . pantoprazole  40 mg Oral Daily  . QUEtiapine  400 mg Oral QHS   Continuous Infusions: . 0.9 % NaCl with KCl 40 mEq / L 75 mL/hr (04/13/17 1506)      LOS: 3 days   Charlynne Cousins  [facility] Pager [phone]  *Please refer to [email or web address], password [email or web address] to get updated schedule on who will round on this patient, as hospitalists switch teams weekly. If 7PM-7AM, please contact night-coverage at [email or web address], password [email or web address] for any overnight needs.  04/14/2017, 8:14 AM

## 2017-04-14 NOTE — Progress Notes (Signed)
Subjective: Patient notes that she feels her grip has become weaker and the paresthesias in bilateral wrists and hands and forearms have worsened. She's having difficulty with fine motor skills such as picking up a fork and feeding herself. She also describes that her left thigh has worsened as far as decreased sensation compared to yesterday.  Exam: Vitals:   04/14/17 0014 04/14/17 0442  BP: 125/89 129/77  Pulse: (!) 111 (!) 107  Resp: 18 20  Temp: 97.8 F (36.6 C) 98.6 F (37 C)  SpO2: 98% 97%    HEENT-  Normocephalic, no lesions, without obvious abnormality.  Normal external eye and conjunctiva.  Normal TM's bilaterally.  Normal auditory canals and external ears. Normal external nose, mucus membranes and septum.  Normal pharynx. Cardiovascular- S1, S2 normal, pulses palpable throughout   Lungs- chest clear, no wheezing, rales, normal symmetric air entry, Heart exam - S1, S2 normal, no murmur, no gallop, rate regular Abdomen- normal findings: bowel sounds normal Extremities- no edema Lymph-no adenopathy palpable Musculoskeletal-no joint tenderness, deformity or swelling Skin-warm and dry, no hyperpigmentation, vitiligo, or suspicious lesions   Mental Status: Alert, oriented, thought content appropriate.  Speech fluent without evidence of aphasia.  Able to follow 3 step commands without difficulty. Cranial Nerves: II: visual fields grossly normal, pupils equal, round, reactive to light and accommodation III,IV, VI: ptosis not present, extraocular muscles extra-ocular motions intact bilaterally V,VII: smile symmetric, facial light touch sensation normal bilaterally VIII: hearing normal bilaterally IX,X: gag reflex present XI: trapezius strength/neck flexion strength normal bilaterally XII: tongue strength normal  Motor: Right : Upper extremity    Left:     Upper extremity 5/5 deltoid       5/5 deltoid 4-/5 tricep      4-/5 tricep 5/5 biceps      5/5 biceps  5/5wrist  flexion     5/5 wrist flexion 4/5 wrist extension     4-/5 wrist extension 4-/5 hand grip      4-/5 hand grip  Lower extremity     Lower extremity 5/5 hip flexor      5/5 hip flexor 5/5 hip adductors     5/5 hip adductors 5/5 hip abductors     5/5 hip abductors 5/5 quadricep      5/5 quadriceps  5/5 hamstrings     5/5 hamstrings 5/5 plantar flexion       5/5 plantar flexion 5/5 plantar extension     5/5 plantar extension Tone and bulk:normal tone throughout; no atrophy noted Sensory: Patient describes paresthesia from bilateral elbows to hands with burning sensation on the left wrist and hand greater than the right. Deep Tendon Reflexes:  Right: Upper Extremity   Left: Upper extremity   biceps (C-5 to C-6) 2/4   biceps (C-5 to C-6) 2/4 tricep (C7) 2/4    triceps (C7) 2/4 Brachioradialis (C6) 2/4  Brachioradialis (C6) 2/4  Lower Extremity Lower Extremity  quadriceps (L-2 to L-4) 0/4   quadriceps (L-2 to L-4) 0/4 Achilles (S1) 1/4   Achilles (S1) 1/4 Plantars: Right: downgoing   Left: downgoing Cerebellar: normal finger-to-nose,    Pertinent Labs/Diagnostics: TSH 0.62 CK 21  Felicie MornDavid Ulas Zuercher PA-C [facility] [phone]  Impression:  56 year old female with progressive paresthesias/weakness.  Overall, with absent reflexes,  and distribution of numbness, I think that a peripheral process is more likely than a central one.  There is no evidence of pleocytosis on LP.  Though the protein is not elevated, I do not think that  this excludes Guillain-Barr syndrome.  Diabetic lumbosacral plexopathy would be another consideration, but this would not explain her upper extremity sensory change.  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  With absent reflexes and her urinary retention, I do think there is objective evidence of deficit and with other etiologies ruled out, I would favor treating for Lorelee Cover at this  time.  Recommendations: 1) continue IVIG for total #5. Patient has received 1/5 today will be 2/5. patient has had no adverse effects 2) continue with PT and OT    04/14/2017, 9:15 AM

## 2017-04-14 NOTE — Evaluation (Signed)
Physical Therapy Evaluation Patient Details Name: Catherine Butler MRN: [id number] DOB: 12/30/1960 Today's Date: 04/14/2017   History of Present Illness  56 y.o. female, with past medical history significant for COPD, hypertension, anxiety and depression admitted with 2 days history of urinary retention and stool incontinence. Patient complains of back pain with numbness in her lower extremities in addition to weakness and had multiple falls lately. Imaging of spine unremarkable.  Pt reporting increasing weakness, decreased motor coordination and sensory changes all 4s since admission..  Neurology recommending IVIG for presummed [name]  Clinical Impression  Pt admitted as above and presenting with functional limitations 2* balance deficits, generalized weakness most notably pelvic/trunk, sensory changes, and decreased coordination all 4s.  Pt currently requires significant assist of 2 to manage all mobility tasks and would benefit from follow up rehab at CIR level.    Follow Up Recommendations CIR    Equipment Recommendations  None recommended by PT    Recommendations for Other Services OT consult     Precautions / Restrictions Precautions Precautions: Fall Precaution Comments: history of falls Restrictions Weight Bearing Restrictions: No      Mobility  Bed Mobility Overal bed mobility: Needs Assistance Bed Mobility: Supine to Sit;Sit to Supine     Supine to sit: Mod assist Sit to supine: Min assist;Mod assist   General bed mobility comments: Cues for sequence with assist to bring trunk to upright and to control descent on return to bed  Transfers Overall transfer level: Needs assistance Equipment used: Rolling walker (2 wheeled) Transfers: Sit to/from Stand Sit to Stand: Min assist;Mod assist;+2 physical assistance;+2 safety/equipment;From elevated surface         General transfer comment: cues for transition position and use of UEs to self assist.  Physical  assist to being wt up and fwd and to stabilize in standing.  Pt with noted poor pelvic/trunk control markely affecting balance in standing  Ambulation/Gait Ambulation/Gait assistance: Mod assist;+2 physical assistance;+2 safety/equipment Ambulation Distance (Feet): 3 Feet Assistive device: Rolling walker (2 wheeled) Gait Pattern/deviations: Step-to pattern;Decreased step length - right;Decreased step length - left;Shuffle;Trunk flexed Gait velocity: decr   General Gait Details: cues for sequence, posture and position from RW.  Physical assist for support, balance and to manage RW.  Pt side shuffled up to top of bed only  Stairs            Wheelchair Mobility    Modified Rankin (Stroke Patients Only)       Balance Overall balance assessment: Needs assistance Sitting-balance support: No upper extremity supported;Feet supported Sitting balance-Leahy Scale: Fair Sitting balance - Comments: Pt able to maintain balance once assisted to position and to tolerate mild challenges   Standing balance support: Bilateral upper extremity supported Standing balance-Leahy Scale: Zero                               Pertinent Vitals/Pain Pain Assessment: No/denies pain    Home Living Family/patient expects to be discharged to:: Private residence Living Arrangements: Parent               Additional Comments: Pt is care giver for elderly parents    Prior Function Level of Independence: Independent               Hand Dominance   Dominant Hand: Right    Extremity/Trunk Assessment   Upper Extremity Assessment Upper Extremity Assessment: RUE deficits/detail;LUE deficits/detail;Generalized weakness RUE Sensation: decreased  proprioception RUE Coordination: decreased fine motor;decreased gross motor LUE Sensation: decreased proprioception LUE Coordination: decreased fine motor;decreased gross motor    Lower Extremity Assessment Lower Extremity Assessment:  Generalized weakness;RLE deficits/detail;LLE deficits/detail RLE Sensation: decreased proprioception RLE Coordination: decreased fine motor;decreased gross motor LLE Sensation: decreased proprioception LLE Coordination: decreased fine motor;decreased gross motor    Cervical / Trunk Assessment Cervical / Trunk Assessment: Other exceptions Cervical / Trunk Exceptions: Pt with notable weakness trunk and pelvic stabilizers  Communication   Communication: No difficulties  Cognition Arousal/Alertness: Lethargic (but rousable - pt states has had little sleep) Behavior During Therapy: Flat affect Overall Cognitive Status: Within Functional Limits for tasks assessed                                        General Comments      Exercises     Assessment/Plan    PT Assessment Patient needs continued PT services  PT Problem List Decreased strength;Decreased activity tolerance;Decreased balance;Decreased mobility;Decreased coordination;Decreased knowledge of use of DME;Impaired sensation       PT Treatment Interventions DME instruction;Gait training;Functional mobility training;Therapeutic activities;Therapeutic exercise;Balance training;Patient/family education    PT Goals (Current goals can be found in the Care Plan section)  Acute Rehab PT Goals Patient Stated Goal: Regain IND PT Goal Formulation: With patient Time For Goal Achievement: 04/28/17 Potential to Achieve Goals: Fair    Frequency Min 3X/week   Barriers to discharge Decreased caregiver support Pt is care giver for elderly parents    Co-evaluation               AM-PAC PT "6 Clicks" Daily Activity  Outcome Measure Difficulty turning over in bed (including adjusting bedclothes, sheets and blankets)?: A Lot Difficulty moving from lying on back to sitting on the side of the bed? : Unable Difficulty sitting down on and standing up from a chair with arms (e.g., wheelchair, bedside commode, etc,.)?:  Unable Help needed moving to and from a bed to chair (including a wheelchair)?: A Lot Help needed walking in hospital room?: A Lot Help needed climbing 3-5 steps with a railing? : Total 6 Click Score: 9    End of Session Equipment Utilized During Treatment: Gait belt Activity Tolerance: Patient limited by fatigue Patient left: in bed;with call bell/phone within reach;with bed alarm set Nurse Communication: Mobility status PT Visit Diagnosis: Unsteadiness on feet (R26.81);Repeated falls (R29.6);Muscle weakness (generalized) (M62.81);Difficulty in walking, not elsewhere classified (R26.2)    Time: 1610-9604 PT Time Calculation (min) (ACUTE ONLY): 21 min   Charges:   PT Evaluation $PT Eval Moderate Complexity: 1 Mod     PT G Codes:        Pg [phone]   Kandise Riehle 04/14/2017, 1:38 PM

## 2017-04-14 NOTE — Progress Notes (Signed)
Rehab Admissions Coordinator Note:  Patient was screened by Trish MageLogue, Coral Timme M for appropriateness for an Inpatient Acute Rehab Consult. Noted PT recommending possible need for CIR.  Noted plans for IVIG for 5 days.  I will follow for progress and needs at this time.  Call me for questions.  Lelon FrohlichLogue, Elanna Bert M 04/14/2017, 2:25 PM  I can be reached at [phone].

## 2017-04-15 DIAGNOSIS — J410 Simple chronic bronchitis: Secondary | ICD-10-CM

## 2017-04-15 NOTE — Progress Notes (Signed)
Spoke with Roderic PalauGenie Logue from CIR about dc plan. She will re-evaluate pt on Monday. If unable to go to CIR, will need backup plan for home or SNF. Will see how pt progresses with IVIG and PT/OT. CSW and CM to follow along and assist as needed. Sandford Crazeora Makayia Duplessis RN,BSN,NCM [phone]

## 2017-04-15 NOTE — Progress Notes (Signed)
PROGRESS NOTE    ALYSSAH ALGEO  GYB:[id number] DOB: [birth year] DOA: 04/11/2017 PCP: Clent Demark, PA-C  Brief Narrative:   56 y.o. female, with past medical history significant for COPD, hypertension, anxiety and depression presenting today with 2 days history of urinary retention and stool incontinence. Patient complains of back pain with numbness in her lower extremities in addition to weakness and had multiple falls lately. Patient denies any burning urination, blood in the urine. No fever or chills. No history of flu or flu shot, lately. Patient reports history of upper extremity numbness in addition to lower extremity numbness for the last 1 year. In the emergency room she was also noted to have urinary retention with 1 L of urine catheterized.  MRI of the cervical spine showed no acute abnormality, mild degenerative spondylosis C3-C4-C5 and 6 without significant stenosis.  MRI of the lumbar spine no abnormal enhancement or acute abnormality.  MRI of the thoracic spine shows small disc protrusions at T7-T8 and 9 and old T4 compression fracture without bony retropulsion.   Neurology Dr. Roque Cash was consulted who recommended IR to perform LP to asses protein,cell count,differential and glucose.  Patient has a Foley catheter in place this was placed due to bladder distention.  Patient also has new onset urinary and bowel incontinence.  Assessment & Plan:   Active Problems:   COPD (chronic obstructive pulmonary disease) (HCC)   Lower extremity weakness   Paresthesia   Essential hypertension  Paresthesias/weakness Continue to endorse weakness of B/L LE   with tingling and numbness with urinary and bowel incontinence workup pending so far MRI of the entire spine is unremarkable.  Lumbar puncture   done by IR CSF protein within normal limits    , may also need EMG, CRP 2.6, ESR 12,   vitamin B-12 696 , TSH,   CK 21. 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 According to physical therapy patient's legs are extremely weak, patient unable to stand  COPD-appears to be stable, continue as needed Ventolin  Hypertension- currently on no medications at home  Gastroesophageal reflux disease  Asthma-stable  Dyslipidemia-continue Lipitor, most recent  lipid panel showed triglycerides to be 397, will repeat  Anxiety/depression- on Seroquel which will be continued  Hypokalemia-replete,  Magnesium 1.5, repleted     DVT prophylaxis:heparin Code Status:full Family Communication:  none Disposition Plan: As per neurology Consultants: neuro  Procedures: none Antimicrobials: none    Objective: Vitals:   04/14/17 1643 04/14/17 1658 04/14/17 2009 04/15/17 0629  BP: 107/85 117/86 119/83 134/70  Pulse: 99 99 (!) 109 62  Resp: _0 Temp: 98.1 F (36.7 C) 98.4 F (36.9 C) 98.3 F (36.8 C) 98 F (36.7 C)  TempSrc: Oral Oral Oral Oral  SpO2: 100% 99% 99% 99%  Weight:      Height:        Intake/Output Summary (Last 24 hours) at 04/15/17 1200 Last data filed at 04/15/17 1005  Gross per 24 hour  Intake             1800 ml  Output             2100 ml  Net             -300 ml   Filed Weights   04/11/17 1002 04/11/17 2026  Weight: 79.4 kg (175 lb) 82.1 kg (181 lb)    Examination:  General exam: Appears calm and comfortable  Respiratory system: Clear to auscultation. Respiratory effort normal. Cardiovascular system: S1 & S2 heard, RRR. No JVD, murmurs, rubs, gallops or clicks. No pedal edema. Gastrointestinal system: Abdomen is nondistended, soft and nontender. No organomegaly or masses felt. Normal bowel sounds heard. Central nervous system: Alert and oriented. No focal neurological deficits. Extremities: Hyporeflexic and bilateral lower extremities  Skin: No rashes, lesions or ulcers Psychiatry: Judgement and insight appear normal. Mood & affect appropriate.     Data Reviewed: I have  personally reviewed following labs and imaging studies  CBC:  Recent Labs Lab 04/11/17 1055 04/14/17 0422  WBC 8.7 7.1  NEUTROABS 6.0  --   HGB 11.8* 10.0*  HCT 34.8* 29.0*  MCV 107.4* 108.6*  PLT 239 017   Basic Metabolic Panel:  Recent Labs Lab 04/11/17 1055 04/12/17 0342 04/13/17 1340 04/14/17 0422  NA 134* 138  --  137  K 3.5 3.0*  --  4.3  CL 97* 103  --  105  CO2 21* 23  --  21*  GLUCOSE 109* 95  --  105*  BUN 6 5*  --  6  CREATININE 0.67 0.68  --  0.65  CALCIUM 8.4* 8.3*  --  8.2*  MG  --   --  1.5*  --    GFR: Estimated Creatinine Clearance: 86.8 mL/min (by C-G formula based on SCr of 0.65 mg/dL). Liver Function Tests:  Recent Labs Lab 04/11/17 1055 04/14/17 0422  AST 70* 77*  ALT 43 39  ALKPHOS 240* 211*  BILITOT 1.3* 1.3*  PROT 6.2* 6.3*  ALBUMIN 2.7* 2.2*   No results for input(s): LIPASE, AMYLASE in the last 168 hours. No results for input(s): AMMONIA in the last 168 hours. Coagulation Profile: No results for input(s): INR, PROTIME in the last 168 hours. Cardiac Enzymes:  Recent Labs Lab 04/13/17 1340  CKTOTAL 21*   BNP (last 3 results) No results for input(s): PROBNP in the last 8760 hours. HbA1C:  Recent Labs  04/12/17 1949 04/13/17 1340  HGBA1C 4.8 4.8   CBG: No results for input(s): GLUCAP in the last 168 hours. Lipid Profile:  Recent Labs  04/14/17 0422  CHOL 227*  HDL 17*  LDLCALC UNABLE TO CALCULATE IF TRIGLYCERIDE OVER 400 mg/dL  TRIG 600*  CHOLHDL 13.4   Thyroid Function Tests:  Recent Labs  04/13/17 1340  TSH 0.862   Anemia Panel:  Recent Labs  04/12/17 1624  VITAMINB12 696   Sepsis Labs: No results for input(s): PROCALCITON, LATICACIDVEN in the last 168 hours.  Recent Results (from the past 240 hour(s))  Gram stain     Status: None   Collection Time: 04/13/17 11:50 AM  Result Value Ref Range Status   Specimen Description CSF  Final   Special Requests NONE  Final   Gram Stain   Final    WBC  PRESENT,BOTH PMN AND MONONUCLEAR NO ORGANISMS SEEN CYTOSPIN Gram Stain Report Called to,Read Back By and Verified With: BODKIN,K. RN _0  10.24.18 BY [email or web address]    Report Status 04/13/2017 FINAL  Final         Radiology Studies: Dg Fluoro Guide Lumbar Puncture  Result Date: 04/13/2017 CLINICAL DATA:  Inpatient. Unexplained paresthesias and lower extremity weakness. EXAM: DIAGNOSTIC LUMBAR PUNCTURE UNDER FLUOROSCOPIC GUIDANCE FLUOROSCOPY TIME:  Fluoroscopy Time:  0 minutes 20 seconds Radiation Exposure Index (if provided by the fluoroscopic device): 8.8 mGy Number of Acquired Spot Images: 0 PROCEDURE: Informed consent was obtained from the patient prior to the procedure, including potential complications  of headache, allergy, and pain. With the patient prone, the lower back was prepped with Betadine. 1% Lidocaine was used for local anesthesia. Lumbar puncture was performed at the left L2-3 level via interlaminar approach using a 20 gauge needle with return of clear CSF with an opening pressure of 11 cm water. 8.5 ml of CSF were obtained for laboratory studies. The patient tolerated the procedure well and there were no apparent complications. IMPRESSION: Technically successful diagnostic lumbar puncture under fluoroscopic guidance. Electronically Signed   By: Ilona Sorrel M.D.   On: 04/13/2017 12:30        Scheduled Meds: . atorvastatin  40 mg Oral Daily  . feeding supplement (ENSURE ENLIVE)  237 mL Oral BID BM  . gabapentin  400 mg Oral QID  . heparin  5,000 Units Subcutaneous Q8H  . Immune Globulin 10%  400 mg/kg Intravenous Q24 Hr x 5  . mouth rinse  15 mL Mouth Rinse q12n4p  . mirtazapine  15 mg Oral QHS  . multivitamin with minerals  1 tablet Oral Daily  . nicotine  21 mg Transdermal Daily  . pantoprazole  40 mg Oral Daily  . QUEtiapine  400 mg Oral QHS   Continuous Infusions: . 0.9 % NaCl with KCl 40 mEq / L 75 mL/hr (04/13/17 1506)     LOS: 4 days        Reyne Dumas, MD [facility]   If 7PM-7AM, please contact night-coverage [email or web address] Password [email or web address] 04/15/2017, 12:00 PM

## 2017-04-15 NOTE — Progress Notes (Signed)
Subjective: No significant changes overnight patient still states that she is having burning in her upper extremities and decreased sensation in her lower extremity's.  She states that her left arm feels that there is more of a burning sensation today.  No other complaints  Exam: Vitals:   04/14/17 2009 04/15/17 0629  BP: 119/83 134/70  Pulse: (!) 109 62  Resp: 16 14  Temp: 98.3 F (36.8 C) 98 F (36.7 C)  SpO2: 99% 99%    HEENT-  Normocephalic, no lesions, without obvious abnormality.  Normal external eye and conjunctiva.  Normal TM's bilaterally.  Normal auditory canals and external ears. Normal external nose, mucus membranes and septum.  Normal pharynx. Cardiovascular- S1, S2 normal, pulses palpable throughout   Lungs- chest clear, no wheezing, rales, normal symmetric air entry, Heart exam - S1, S2 normal, no murmur, no gallop, rate regular   Mental Status: Alert, oriented to place time and year.  No aphasia or dysarthria.  Slight flat affect but also states she did not sleep well last night cranial Nerves: II: visual fields grossly normal, pupils equal, round, reactive to light and accommodation III,IV, VI: ptosis not present, extraocular muscles extra-ocular motions intact bilaterally V,VII: smile symmetric, facial light touch sensation normal bilaterally VIII: hearing normal bilaterally IX,X: gag reflex present XI: trapezius strength/neck flexion strength normal bilaterally XII: tongue strength normal  Motor: Right :  Upper extremity                                              Left:     Upper extremity 5/5 deltoid                                                                   5/5 deltoid 4-/5 tricep                                                                    4-/5 tricep 5/5 biceps                                                                    5/5 biceps  5/5wrist flexion                                                            5/5 wrist flexion 4/5 wrist  extension  4-/5 wrist extension 4-/5 hand grip                                                              4-/5 hand grip             Lower extremity                                                          Lower extremity 5/5 hip flexor                                                               5/5 hip flexor 5/5 hip adductors                                                        5/5 hip adductors 5/5 hip abductors                                                        5/5 hip abductors 5/5 quadricep                                                              5/5 quadriceps  5/5 hamstrings                                                            5/5 hamstrings 5/5 plantar flexion                                                       5/5 plantar flexion 5/5 plantar extension                                                  5/5 plantar extension Tone and bulk:normal tone throughout; no atrophy noted Sensory: Patient describes paresthesia from bilateral elbows to hands with burning sensation on the left  wrist and hand greater than the right. Deep Tendon Reflexes:  Right: Upper Extremity                       Left: Upper extremity   biceps (C-5 to C-6) 2/4                       biceps (C-5 to C-6) 2/4 tricep (C7) 2/4                                     triceps (C7) 2/4 Brachioradialis (C6) 2/4                      Brachioradialis (C6) 2/4  Lower Extremity Lower Extremity  quadriceps (L-2 to L-4) 0/4                 quadriceps (L-2 to L-4) 0/4 Achilles (S1) 1/4                                  Achilles (S1) 1/4 Plantars: Right: downgoing                                Left: downgoing Cerebellar: normal finger-to-nose,   Medications:  Scheduled: . atorvastatin  40 mg Oral Daily  . feeding supplement (ENSURE ENLIVE)  237 mL Oral BID BM  . gabapentin  400 mg Oral QID  . heparin  5,000 Units Subcutaneous Q8H  . Immune Globulin 10%   400 mg/kg Intravenous Q24 Hr x 5  . mouth rinse  15 mL Mouth Rinse q12n4p  . mirtazapine  15 mg Oral QHS  . multivitamin with minerals  1 tablet Oral Daily  . nicotine  21 mg Transdermal Daily  . pantoprazole  40 mg Oral Daily  . QUEtiapine  400 mg Oral QHS    Pertinent Labs/Diagnostics:  Basic Metabolic Panel:  Recent Labs Lab 04/11/17 1055 04/12/17 0342 04/13/17 1340 04/14/17 0422  NA 134* 138  --  137  K 3.5 3.0*  --  4.3  CL 97* 103  --  105  CO2 21* 23  --  21*  GLUCOSE 109* 95  --  105*  BUN 6 5*  --  6  CREATININE 0.67 0.68  --  0.65  CALCIUM 8.4* 8.3*  --  8.2*  MG  --   --  1.5*  --     CBC:  Recent Labs Lab 04/11/17 1055 04/14/17 0422  WBC 8.7 7.1  NEUTROABS 6.0  --   HGB 11.8* 10.0*  HCT 34.8* 29.0*  MCV 107.4* 108.6*  PLT 239 172    Dg Fluoro Guide Lumbar Puncture  Result Date: 04/13/2017 CLINICAL DATA:  Inpatient. Unexplained paresthesias and lower extremity weakness. EXAM: DIAGNOSTIC LUMBAR PUNCTURE UNDER FLUOROSCOPIC GUIDANCE FLUOROSCOPY TIME:  Fluoroscopy Time:  0 minutes 20 seconds Radiation Exposure Index (if provided by the fluoroscopic device): 8.8 mGy Number of Acquired Spot Images: 0 PROCEDURE: Informed consent was obtained from the patient prior to the procedure, including potential complications of headache, allergy, and pain. With the patient prone, the lower back was prepped with Betadine. 1% Lidocaine was used for local anesthesia. Lumbar puncture was performed at the left L2-3 level via interlaminar approach using  a 20 gauge needle with return of clear CSF with an opening pressure of 11 cm water. 8.5 ml of CSF were obtained for laboratory studies. The patient tolerated the procedure well and there were no apparent complications. IMPRESSION: Technically successful diagnostic lumbar puncture under fluoroscopic guidance. Electronically Signed   By: Delbert Phenix M.D.   On: 04/13/2017 12:30     Felicie Morn PA-C [facility] [phone]  Impression: 56 year old female with progressive paresthesias/weakness. Overall, with absent reflexes,  and distribution of numbness, a peripheral process is more likely than a central one. There is no evidence of pleocytosis on LP. 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   Recommendations: 1) continue IVIG for total #5. Patient has received 2/5 today will be 3/5. patient has had no adverse effects--after 2 doses patient shows no significant improvement. 2) continue with PT and OT    04/15/2017, 8:49 AM

## 2017-04-15 NOTE — Evaluation (Signed)
Occupational Therapy Evaluation Patient Details Name: Catherine Butler MRN: [id number] DOB: December 14, 1960 Today's Date: 04/15/2017    History of Present Illness 56 y.o. female, with past medical history significant for COPD, hypertension, anxiety and depression admitted with 2 days history of urinary retention and stool incontinence. Patient complains of back pain with numbness in her lower extremities in addition to weakness and had multiple falls lately. Imaging of spine unremarkable.  Pt reporting increasing weakness, decreased motor coordination and sensory changes all 4s since admission..  Neurology recommending IVIG for presummed [name]   Clinical Impression   Pt was admitted for the above. Evaluated from sitting in chair:  Per PT notes need +2 to stand.  Issued AE for self feeding and educated on compensation for decreased motor control. Pt is very independent natured and was able to open the majority of containers on her tray.  Will follow in acute setting with set up goals for UB/grooming and mod +2 for LB adls and transfers.  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 BUE abnormal sensation and decreased motor control especially on L Restrictions Weight Bearing Restrictions: No      Mobility Bed Mobility Overal bed mobility: Needs Assistance Bed Mobility: Supine to Sit     Supine to sit: Mod assist;Max assist     General bed mobility comments: oob by PT.  Decreased motor control with weight shifting in chair and also when reaching to feet  Transfers Overall transfer level: Needs assistance Equipment used: Rolling walker (2 wheeled);2 person hand held assist Transfers:  Sit to/from [employer] Transfers Sit to Stand: Max assist Stand pivot transfers: Max assist;Total assist       General transfer comment: Not performed by PT:  noted max to total +2 by PT    Balance       Sitting balance - Comments: decreased motor control when reaching forward towards feet                                   ADL either performed or assessed with clinical judgement   ADL Overall ADL's : Needs assistance/impaired Eating/Feeding: Minimal assistance;Sitting Eating/Feeding Details (indicate cue type and reason): for set up. Pt tries to open everything herself Grooming: Minimal assistance;Sitting   Upper Body Bathing: Minimal assistance;Sitting   Lower Body Bathing: Maximal assistance;+2 for physical assistance;Sit to/from stand   Upper Body Dressing : Moderate assistance;Sitting   Lower Body Dressing: Total assistance;Sit to/from stand;+2 for physical assistance                 General ADL Comments: pt assessed up in chair. Brought lidded mug and built up foam for utensils. Educated to either keep arms on tray table or tuck arms into side to help control movement.  Pt tries very hard to be as independent as possible.  Pt wanted to sit up in chair to eat. Did not transfer this session     Vision         Perception     Praxis      Pertinent Vitals/Pain Pain Assessment: Faces Faces  Pain Scale: Hurts little more Pain Location: BUEs Pain Descriptors / Indicators: Pins and needles Pain Intervention(s): Limited activity within patient's tolerance;Monitored during session     Hand Dominance Right   Extremity/Trunk Assessment Upper Extremity Assessment Upper Extremity Assessment: RUE deficits/detail RUE Deficits / Details: pt able to move RUE, mild decreased motor control; decreased sensation. She was able to open most meal containers with extra effort.  Pt able to hold utensil with built up foam provided.  Noted, pt was unable to  keep bil UEs on hand grips of walker with PT RUE Coordination: decreased fine motor;decreased gross motor LUE Deficits / Details: while pt can move through ROM, she has impaired motor control; arm does not track in straight line.  This is non-dominant hand. She uses it as an assist LUE Coordination: decreased fine motor;decreased gross motor           Communication Communication Communication: No difficulties   Cognition Arousal/Alertness: Awake/alert Behavior During Therapy: WFL for tasks assessed/performed Overall Cognitive Status: Within Functional Limits for tasks assessed                                     General Comments       Exercises     Shoulder Instructions      Home Living Family/patient expects to be discharged to:: Private residence Living Arrangements: Parent                               Additional Comments: Pt is care giver for elderly parents      Prior Functioning/Environment Level of Independence: Independent                 OT Problem List: Decreased strength;Decreased activity tolerance;Impaired balance (sitting and/or standing);Decreased coordination;Decreased knowledge of use of DME or AE;Impaired sensation;Impaired tone;Impaired UE functional use;Pain      OT Treatment/Interventions: Self-care/ADL training;Therapeutic exercise;DME and/or AE instruction;Therapeutic activities;Patient/family education;Balance training    OT Goals(Current goals can be found in the care plan section) Acute Rehab OT Goals Patient Stated Goal: Regain IND OT Goal Formulation: With patient Time For Goal Achievement: 04/29/17 Potential to Achieve Goals: Good ADL Goals Pt Will Perform Grooming: with set-up;sitting Pt Will Perform Upper Body Bathing: with set-up;with adaptive equipment;sitting (wash mitt) Pt Will Perform Lower Body Bathing: with adaptive equipment;sit to/from stand;with mod assist (+2) Pt Will Perform Upper Body  Dressing: with min assist;sitting Pt Will Perform Lower Body Dressing: with mod assist;sit to/from stand (+2) Pt Will Transfer to Toilet: with mod assist;bedside commode;stand pivot transfer;with +2 assist Pt Will Perform Toileting - Clothing Manipulation and hygiene: with max assist;with 2+ total assist;sitting/lateral leans;sit to/from stand (vs)  OT Frequency: Min 2X/week   Barriers to D/C:            Co-evaluation              AM-PAC PT "6 Clicks" Daily Activity     Outcome Measure Help from another person eating meals?: A Little Help from another person taking care of personal grooming?: A Little Help from another person toileting, which includes using toliet, bedpan, or urinal?: A Lot Help from another person bathing (including washing, rinsing, drying)?: A Lot Help from another person to put on and taking off regular upper body clothing?: A Lot Help from another person to put on and taking off regular lower  body clothing?: Total 6 Click Score: 13   End of Session    Activity Tolerance: Patient tolerated treatment well Patient left: in chair;with call bell/phone within reach;with chair alarm set  OT Visit Diagnosis: Muscle weakness (generalized) (M62.81);Other abnormalities of gait and mobility (R26.89);Pain Pain - Right/Left:  (bil) Pain - part of body: Arm                Time: [id number] OT Time Calculation (min): 13 min Charges:  OT General Charges $OT Visit: 1 Visit OT Evaluation $OT Eval Moderate Complexity: 1 Mod G-Codes:     [place]Maryellen Abid Bolla, OTR/L [phone] 04/15/2017  Breauna Mazzeo 04/15/2017, 1:37 PM

## 2017-04-15 NOTE — Progress Notes (Signed)
Physical Therapy Treatment Patient Details Name: Catherine Butler MRN: [id number] DOB: 11/15/1960 Today's Date: 04/15/2017    History of Present Illness [age] y.o. female, with past medical history significant for COPD, hypertension, anxiety and depression admitted with 2 days history of urinary retention and stool incontinence. Patient complains of back pain with numbness in her lower extremities in addition to weakness and had multiple falls lately. Imaging of spine unremarkable.  Pt reporting increasing weakness, decreased motor coordination and sensory changes all 4s since admission..  Neurology recommending IVIG for presummed [name]    PT Comments    Assisted OOB to recliner + 2 assist.  See mobility details below.   Follow Up Recommendations  CIR     Equipment Recommendations  None recommended by PT    Recommendations for Other Services       Precautions / Restrictions Precautions Precautions: Fall Precaution Comments: history of falls  B LE parethesis/weakness Restrictions Weight Bearing Restrictions: No    Mobility  Bed Mobility Overal bed mobility: Needs Assistance Bed Mobility: Supine to Sit     Supine to sit: Mod assist;Max assist     General bed mobility comments: required increased time, elevated HOB and use of bed pad to complete scooting to EOB.  Once upright, pt required Supervision to prevent sitting LOB all planes.    Transfers Overall transfer level: Needs assistance Equipment used: Rolling walker (2 wheeled);2 person hand held assist Transfers: Sit to/from [employer] CorporationStand;Stand Pivot Transfers Sit to Stand: Max assist Stand pivot transfers: Max assist;Total assist       General transfer comment: + 2 side by side assist, pt was unable to self rise and extend knees to attempt upright postue.  Therapist had to block knees as pt stood to prevent buckle.  Once upright, pt required Mod Assist to prevent LOB.  Demonstarted poor trunk control and ataxic mvts.  pt  was unable to fully grip walker handles and keep placement.    Ambulation/Gait Ambulation/Gait assistance: Mod assist;Max assist;+2 physical assistance;+2 safety/equipment Ambulation Distance (Feet): 3 Feet (side stepping R and L along bed only) Assistive device: Rolling walker (2 wheeled)       General Gait Details: physical assist to achieve upright postue and + 2 side by side assist as pt was only able to take a few side steps along bed with poor motor control and continued B LE weakness.  Unable to progress gait forward due severe gait instability.     Stairs            Wheelchair Mobility    Modified Rankin (Stroke Patients Only)       Balance                                            Cognition Arousal/Alertness: Awake/alert (groggy/slow) Behavior During Therapy: WFL for tasks assessed/performed Overall Cognitive Status: Within Functional Limits for tasks assessed                                        Exercises      General Comments        Pertinent Vitals/Pain Pain Assessment: Faces Faces Pain Scale: Hurts a little bit Pain Location: B arms "feels like needles"  Pain Intervention(s): Monitored during session    Home Living  Prior Function            PT Goals (current goals can now be found in the care plan section) Progress towards PT goals: Progressing toward goals    Frequency    Min 3X/week      PT Plan Current plan remains appropriate    Co-evaluation              AM-PAC PT "6 Clicks" Daily Activity  Outcome Measure  Difficulty turning over in bed (including adjusting bedclothes, sheets and blankets)?: Unable Difficulty moving from lying on back to sitting on the side of the bed? : Unable Difficulty sitting down on and standing up from a chair with arms (e.g., wheelchair, bedside commode, etc,.)?: Unable Help needed moving to and from a bed to chair (including  a wheelchair)?: Total Help needed walking in hospital room?: Total Help needed climbing 3-5 steps with a railing? : Total 6 Click Score: 6    End of Session Equipment Utilized During Treatment: Gait belt Activity Tolerance: Other (comment) Patient left: in chair;with chair alarm set Nurse Communication: Mobility status PT Visit Diagnosis: Unsteadiness on feet (R26.81);Repeated falls (R29.6);Muscle weakness (generalized) (M62.81);Difficulty in walking, not elsewhere classified (R26.2)     Time: 1027-2536 PT Time Calculation (min) (ACUTE ONLY): 26 min  Charges:  $Therapeutic Activity: 23-37 mins                    G Codes:     Felecia Shelling  PTA [facility]  Acute  Rehab Pager      [phone]

## 2017-04-16 DIAGNOSIS — R202 Paresthesia of skin: Secondary | ICD-10-CM

## 2017-04-16 LAB — COMPREHENSIVE METABOLIC PANEL
ALBUMIN: 1.9 g/dL — AB (ref 3.5–5.0)
ALT: 39 U/L (ref 14–54)
ANION GAP: 8 (ref 5–15)
AST: 99 U/L — AB (ref 15–41)
Alkaline Phosphatase: 223 U/L — ABNORMAL HIGH (ref 38–126)
BILIRUBIN TOTAL: 1.3 mg/dL — AB (ref 0.3–1.2)
BUN: 5 mg/dL — AB (ref 6–20)
CALCIUM: 8.2 mg/dL — AB (ref 8.9–10.3)
CO2: 22 mmol/L (ref 22–32)
Chloride: 106 mmol/L (ref 101–111)
Creatinine, Ser: 0.65 mg/dL (ref 0.44–1.00)
GFR calc Af Amer: >60 mL/min (ref >60)
GLUCOSE: 98 mg/dL (ref 65–99)
POTASSIUM: 5.1 mmol/L (ref 3.5–5.1)
Sodium: 136 mmol/L (ref 135–145)
Total Protein: 7.1 g/dL (ref 6.5–8.1)

## 2017-04-16 LAB — CBC
HCT: 29.8 % — ABNORMAL LOW (ref 36.0–46.0)
HEMOGLOBIN: 9.8 g/dL — AB (ref 12.0–15.0)
MCH: 36.8 pg — AB (ref 26.0–34.0)
MCHC: 32.9 g/dL (ref 30.0–36.0)
MCV: 112 fL — ABNORMAL HIGH (ref 78.0–100.0)
Platelets: 191 10*3/uL (ref 150–400)
RBC: 2.66 MIL/uL — ABNORMAL LOW (ref 3.87–5.11)
RDW: 14.6 % (ref 11.5–15.5)
WBC: 5.7 10*3/uL (ref 4.0–10.5)

## 2017-04-16 MED ORDER — OXYCODONE HCL 5 MG PO TABS
10.0000 mg | ORAL_TABLET | ORAL | Status: DC | PRN
  Administered 2017-04-16 – 2017-04-18 (×10): 10 mg via ORAL
  Filled 2017-04-16 (×10): qty 2

## 2017-04-16 MED ORDER — GABAPENTIN 300 MG PO CAPS
600.0000 mg | ORAL_CAPSULE | Freq: Four times a day (QID) | ORAL | Status: DC
  Administered 2017-04-16 – 2017-04-20 (×13): 600 mg via ORAL
  Filled 2017-04-16 (×14): qty 2

## 2017-04-16 MED ORDER — LACTATED RINGERS IV SOLN
INTRAVENOUS | Status: AC
  Administered 2017-04-16 (×2): via INTRAVENOUS

## 2017-04-16 MED ORDER — FUROSEMIDE 10 MG/ML IJ SOLN
20.0000 mg | Freq: Once | INTRAMUSCULAR | Status: AC
  Administered 2017-04-16: 20 mg via INTRAVENOUS
  Filled 2017-04-16: qty 2

## 2017-04-16 MED ORDER — DEXTROSE 5 % IV BOLUS
50.0000 mL | Freq: Once | INTRAVENOUS | Status: AC
  Administered 2017-04-16: 50 mL via INTRAVENOUS

## 2017-04-16 MED ORDER — MAGNESIUM SULFATE IN D5W 1-5 GM/100ML-% IV SOLN
1.0000 g | Freq: Once | INTRAVENOUS | Status: AC
  Administered 2017-04-16: 1 g via INTRAVENOUS
  Filled 2017-04-16 (×2): qty 100

## 2017-04-16 NOTE — Progress Notes (Signed)
Subjective: Continues to complain of significant painful paresthesias  Exam: Vitals:   04/16/17 1734 04/16/17 1750  BP: 112/88 115/89  Pulse: 100 100  Resp: 18 18  Temp: 98.8 F (37.1 C) 98.9 F (37.2 C)  SpO2: 98% 99%   Gen: In bed, NAD Resp: non-labored breathing, no acute distress Abd: soft, nt  Neuro: MS: Awake, alert, interactive and appropriate CN: Pupils equal round and reactive to light, sharp movements intact Motor:  Right :Upper extremityLeft: Upper extremity 5/5 deltoid5/5 deltoid 4-/5 tricep4-/5 tricep 4/5 biceps5/5 biceps  4/5wrist flexion4/5 wrist flexion 4/5 wrist extension4-/5 wrist extension 4-/5 interossei4-/5 interosei Lower extremityLower extremity 4/5 hip flexor4/5 hip flexor 5/5 hip adductors5/5 hip adductors 5/5 hip abductors5/5 hip abductors 5/5 quadricep5/5 quadriceps  5/5 hamstrings5/5 hamstrings 5/5 plantar flexion 5/5 plantar flexion 5/5 plantar extension5/5 plantar extension DTR: She has good ankle and biceps reflexes, depressed brachioradialis and knee  reflexes  Impression: 56 year old female with progressive paresthesias/weakness.  Overall, with absent reflexes, and distribution of numbness, I think that a peripheral process is more likely than a central one.  There is no evidence of pleocytosis on LP.  Though the protein is not elevated, I do not think that this excludes Guillain-Barr syndrome.  Diabetic lumbosacral plexopathy would be another consideration, but this would not explain her upper extremity sensory change.  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  With absent reflexes and her urinary retention, I do think there is objective evidence of deficit and with other etiologies ruled out, I favored treating for GBS.  An EMG would be most helpful, and if she continues to not have any response to IVIG, prior to other therapies I think she would need to be transferred to another institution which could perform this.  Recommendations: 1) continue IVIG  Ritta SlotMcNeill Ceaser Ebeling, MD [facility] [phone]  If 7pm- 7am, please page neurology on call as listed in AMION.

## 2017-04-16 NOTE — Progress Notes (Signed)
PROGRESS NOTE    Catherine Butler  XBJ:[id number] DOB: 12/30/1960 DOA: 04/11/2017 PCP: Loletta SpecterGomez, Roger David, PA-C  Brief Narrative:   56 y.o. female, with past medical history significant for COPD, hypertension, anxiety and depression presenting today with 2 days history of urinary retention and stool incontinence. Patient complains of back pain with numbness in her lower extremities in addition to weakness and had multiple falls lately. Patient denies any burning urination, blood in the urine. No fever or chills. No history of flu or flu shot, lately. Patient reports history of upper extremity numbness in addition to lower extremity numbness for the last 1 year. In the emergency room she was also noted to have urinary retention with 1 L of urine catheterized.  MRI of the cervical spine showed no acute abnormality, mild degenerative spondylosis C3-C4-C5 and 6 without significant stenosis.  MRI of the lumbar spine no abnormal enhancement or acute abnormality.  MRI of the thoracic spine shows small disc protrusions at T7-T8 and 9 and old T4 compression fracture without bony retropulsion.   Neurology Dr. Melinda Crutchkirkpatrik was consulted who recommended IR to perform LP to asses protein,cell count,differential and glucose.  Patient has a Foley catheter in place this was placed due to bladder distention.  Patient also has new onset urinary and bowel incontinence.   Subjective  - Patient in bed, appears comfortable, denies any headache, no fever, no chest pain or pressure, no shortness of breath , no abdominal pain. No focal weakness.  Does have pain in all 4 extremities legs more than arms.   Assessment & Plan:   Paresthesias/weakness  - weakness motor in lower extremities and upper, some neuropathic pain as well, did develop bowel and bladder incontinence.  MRI of the spine unremarkable, CSF does not show any pleocytosis, GBS suspected by neurology who have initiated the patient on 5 days of IVIG which will be  continued last dose 04/17/2017.  Continue pain control.  Further management per neurology.   COPD-appears to be stable, continue as needed Ventolin  Hypertension- currently on no medications at home  Gastroesophageal reflux disease - on PPI  Asthma- stable  Dyslipidemia - continue Lipitor, most recent  lipid panel showed triglycerides to be 397, will repeat  Anxiety/depression - on Seroquel which will be continued        DVT prophylaxis: Heparin Code Status:full Family Communication:  none Disposition Plan: As per neurology Consultants: neuro  Procedures: none Antimicrobials: none    Objective: Vitals:   04/15/17 1715 04/15/17 1730 04/15/17 2021 04/16/17 0605  BP: 130/90 (!) 120/92 (!) 128/95 (!) 130/94  Pulse: 98 100 (!) 115 (!) 124  Resp: 18 18 16 16   Temp: 98.3 F (36.8 C) 98.3 F (36.8 C) 98 F (36.7 C) 98.1 F (36.7 C)  TempSrc: Oral Oral Oral Oral  SpO2: 98% 99% 99% 99%  Weight:      Height:        Intake/Output Summary (Last 24 hours) at 04/16/17 1302 Last data filed at 04/16/17 0610  Gross per 24 hour  Intake              480 ml  Output             2900 ml  Net            -2420 ml   Filed Weights   04/11/17 1002 04/11/17 2026  Weight: 79.4 kg (175 lb) 82.1 kg (181 lb)    Examination:  Awake Alert, Oriented X 3,  No new F.N deficits, strength 4/5 in the legs, Normal affect [place].AT,PERRAL Supple Neck,No JVD, No cervical lymphadenopathy appriciated.  Symmetrical Chest wall movement, Good air movement bilaterally, CTAB RRR,No Gallops,Rubs or new Murmurs, No Parasternal Heave +ve B.Sounds, Abd Soft, No tenderness, No organomegaly appriciated, No rebound - guarding or rigidity. No Cyanosis, Clubbing or edema, No new Rash or bruise  Data Reviewed: I have personally reviewed following labs and imaging studies  CBC:  Recent Labs Lab 04/11/17 1055 04/14/17 0422 04/16/17 0356  WBC 8.7 7.1 5.7  NEUTROABS 6.0  --   --   HGB 11.8* 10.0* 9.8*  HCT  34.8* 29.0* 29.8*  MCV 107.4* 108.6* 112.0*  PLT 239 172 191   Basic Metabolic Panel:  Recent Labs Lab 04/11/17 1055 04/12/17 0342 04/13/17 1340 04/14/17 0422 04/16/17 0356  NA 134* 138  --  137 136  K 3.5 3.0*  --  4.3 5.1  CL 97* 103  --  105 106  CO2 21* 23  --  21* 22  GLUCOSE 109* 95  --  105* 98  BUN 6 5*  --  6 5*  CREATININE 0.67 0.68  --  0.65 0.65  CALCIUM 8.4* 8.3*  --  8.2* 8.2*  MG  --   --  1.5*  --   --    GFR: Estimated Creatinine Clearance: 86.8 mL/min (by C-G formula based on SCr of 0.65 mg/dL). Liver Function Tests:  Recent Labs Lab 04/11/17 1055 04/14/17 0422 04/16/17 0356  AST 70* 77* 99*  ALT 43 39 39  ALKPHOS 240* 211* 223*  BILITOT 1.3* 1.3* 1.3*  PROT 6.2* 6.3* 7.1  ALBUMIN 2.7* 2.2* 1.9*   No results for input(s): LIPASE, AMYLASE in the last 168 hours. No results for input(s): AMMONIA in the last 168 hours. Coagulation Profile: No results for input(s): INR, PROTIME in the last 168 hours. Cardiac Enzymes:  Recent Labs Lab 04/13/17 1340  CKTOTAL 21*   BNP (last 3 results) No results for input(s): PROBNP in the last 8760 hours. HbA1C:  Recent Labs  04/13/17 1340  HGBA1C 4.8   CBG: No results for input(s): GLUCAP in the last 168 hours. Lipid Profile:  Recent Labs  04/14/17 0422  CHOL 227*  HDL 17*  LDLCALC UNABLE TO CALCULATE IF TRIGLYCERIDE OVER 400 mg/dL  TRIG 657*  CHOLHDL 84.6   Thyroid Function Tests:  Recent Labs  04/13/17 1340  TSH 0.862   Anemia Panel: No results for input(s): VITAMINB12, FOLATE, FERRITIN, TIBC, IRON, RETICCTPCT in the last 72 hours. Sepsis Labs: No results for input(s): PROCALCITON, LATICACIDVEN in the last 168 hours.  Recent Results (from the past 240 hour(s))  Gram stain     Status: None   Collection Time: 04/13/17 11:50 AM  Result Value Ref Range Status   Specimen Description CSF  Final   Special Requests NONE  Final   Gram Stain   Final    WBC PRESENT,BOTH PMN AND  MONONUCLEAR NO ORGANISMS SEEN CYTOSPIN Gram Stain Report Called to,Read Back By and Verified With: BODKIN,K. RN @1403  10.24.18 BY NMCCOY    Report Status 04/13/2017 FINAL  Final     Radiology Studies: No results found.   Scheduled Meds: . atorvastatin  40 mg Oral Daily  . feeding supplement (ENSURE ENLIVE)  237 mL Oral BID BM  . gabapentin  400 mg Oral QID  . heparin  5,000 Units Subcutaneous Q8H  . Immune Globulin 10%  400 mg/kg Intravenous Q24 Hr x 5  .  mouth rinse  15 mL Mouth Rinse q12n4p  . mirtazapine  15 mg Oral QHS  . multivitamin with minerals  1 tablet Oral Daily  . nicotine  21 mg Transdermal Daily  . pantoprazole  40 mg Oral Daily  . QUEtiapine  400 mg Oral QHS   Continuous Infusions: . 0.9 % NaCl with KCl 40 mEq / L 75 mL/hr (04/16/17 1610)     LOS: 5 days    Signature  Susa Raring M.D on 04/16/2017 at 1:02 PM  Between 7am to 7pm - Pager - [phone] ( page via [email or web address], text pages only, please mention full 10 digit call back number).  After 7pm go to [email or web address] - password [facility]

## 2017-04-17 LAB — BASIC METABOLIC PANEL
ANION GAP: 11 (ref 5–15)
BUN: 7 mg/dL (ref 6–20)
CO2: 24 mmol/L (ref 22–32)
Calcium: 8.3 mg/dL — ABNORMAL LOW (ref 8.9–10.3)
Chloride: 97 mmol/L — ABNORMAL LOW (ref 101–111)
Creatinine, Ser: 0.62 mg/dL (ref 0.44–1.00)
GFR calc non Af Amer: >60 mL/min (ref >60)
Glucose, Bld: 126 mg/dL — ABNORMAL HIGH (ref 65–99)
POTASSIUM: 3.8 mmol/L (ref 3.5–5.1)
SODIUM: 132 mmol/L — AB (ref 135–145)

## 2017-04-17 LAB — CBC
HEMATOCRIT: 32.5 % — AB (ref 36.0–46.0)
HEMOGLOBIN: 10.9 g/dL — AB (ref 12.0–15.0)
MCH: 36.8 pg — ABNORMAL HIGH (ref 26.0–34.0)
MCHC: 33.5 g/dL (ref 30.0–36.0)
MCV: 109.8 fL — ABNORMAL HIGH (ref 78.0–100.0)
Platelets: 140 10*3/uL — ABNORMAL LOW (ref 150–400)
RBC: 2.96 MIL/uL — AB (ref 3.87–5.11)
RDW: 14.4 % (ref 11.5–15.5)
WBC: 7.4 10*3/uL (ref 4.0–10.5)

## 2017-04-17 LAB — MAGNESIUM: MAGNESIUM: 1.6 mg/dL — AB (ref 1.7–2.4)

## 2017-04-17 MED ORDER — METOPROLOL TARTRATE 50 MG PO TABS
50.0000 mg | ORAL_TABLET | Freq: Two times a day (BID) | ORAL | Status: DC
  Administered 2017-04-17 – 2017-04-20 (×7): 50 mg via ORAL
  Filled 2017-04-17 (×7): qty 1

## 2017-04-17 MED ORDER — PROMETHAZINE HCL 25 MG/ML IJ SOLN
12.5000 mg | Freq: Once | INTRAMUSCULAR | Status: AC
  Administered 2017-04-17: 12.5 mg via INTRAVENOUS
  Filled 2017-04-17: qty 1

## 2017-04-17 MED ORDER — MAGNESIUM SULFATE 2 GM/50ML IV SOLN
2.0000 g | Freq: Once | INTRAVENOUS | Status: AC
  Administered 2017-04-17: 2 g via INTRAVENOUS
  Filled 2017-04-17: qty 50

## 2017-04-17 MED ORDER — FENOFIBRATE 160 MG PO TABS
160.0000 mg | ORAL_TABLET | Freq: Every day | ORAL | Status: DC
  Administered 2017-04-17 – 2017-04-20 (×4): 160 mg via ORAL
  Filled 2017-04-17 (×4): qty 1

## 2017-04-17 NOTE — Progress Notes (Addendum)
PROGRESS NOTE    Catherine Butler  WUJ:[id number] DOB: 1961/03/09 DOA: 04/11/2017 PCP: Loletta Specter, PA-C  Brief Narrative:    56 y.o. female, with past medical history significant for COPD, hypertension, anxiety and depression presenting today with 2 days history of urinary retention and stool incontinence. Patient complains of back pain with numbness in her lower extremities in addition to weakness and had multiple falls lately. Patient denies any burning urination, blood in the urine. No fever or chills. No history of flu or flu shot, lately. Patient reports history of upper extremity numbness in addition to lower extremity numbness for the last 1 year. In the emergency room she was also noted to have urinary retention with 1 L of urine catheterized.  MRI of the cervical spine showed no acute abnormality, mild degenerative spondylosis C3-C4-C5 and 6 without significant stenosis.  MRI of the lumbar spine no abnormal enhancement or acute abnormality.  MRI of the thoracic spine shows small disc protrusions at T7-T8 and 9 and old T4 compression fracture without bony retropulsion.  Neurology Dr. Melinda Crutch was consulted who recommended IR to perform LP to asses protein,cell count,differential and glucose.  Patient has a Foley catheter in place this was placed due to bladder distention.  Patient also has new onset urinary and bowel incontinence.   Subjective  - Patient in bed, appears comfortable, denies any headache, no fever, no chest pain or pressure, no shortness of breath , no abdominal pain. No focal weakness.    Assessment & Plan:   Paresthesias/weakness  - weakness motor in lower extremities and upper, some neuropathic pain as well, did develop bowel and bladder incontinence.  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  Continue supportive care  and pain control, continue PT and increase activity, defer further management to neurology, likely will require SNF upon discharge. NIF - 36.  Plan DW Dr Amada Jupiter on 04-17-17, called [facility] and DW Dr Alphonzo [facility] on 04-17-17 @ 12.18pm who declined transfer stating management now is to observe only.    COPD-appears to be stable, continue as needed Ventolin  Hypertension-start on Lopressor for good control.  Gastroesophageal reflux disease - on PPI  Asthma- stable  Dyslipidemia -on Lipitor, triglycerides extremely high, will add TriCor as well.  Anxiety/depression - on Seroquel which will be continued    DVT prophylaxis: Heparin Code Status:full Family Communication:  none Disposition Plan: As per neurology Consultants: neuro  Procedures: none Antimicrobials: none    Objective: Vitals:   04/16/17 1734 04/16/17 1750 04/16/17 2014 04/17/17 0518  BP: 112/88 115/89 (!) 115/96 (!) 137/106  Pulse: 100 100 (!) 118 (!) 123  Resp: 18 18 16 20   Temp: 98.8 F (37.1 C) 98.9 F (37.2 C) 98.7 F (37.1 C) 98.7 F (37.1 C)  TempSrc: Oral Oral Oral Oral  SpO2: 98% 99% 98% 98%  Weight:      Height:        Intake/Output Summary (Last 24 hours) at 04/17/17 1109 Last data filed at 04/17/17 9562  Gross per 24 hour  Intake             2500 ml  Output             2700 ml  Net             -200 ml   [employer]  04/11/17 1002 04/11/17 2026  Weight: 79.4 kg (175 lb) 82.1 kg (181 lb)    Examination:  Awake Alert, Oriented X 3, No new F.N deficits, Normal affect [place].AT,PERRAL Supple Neck,No JVD, No cervical lymphadenopathy appriciated.  Symmetrical Chest wall movement, Good air movement bilaterally, CTAB RRR,No Gallops, Rubs or new Murmurs, No Parasternal Heave +ve B.Sounds, Abd Soft, No tenderness, No organomegaly appriciated, No rebound - guarding or rigidity. No Cyanosis, Clubbing or edema, No new Rash or bruise   Data Reviewed: I have personally reviewed following labs  and imaging studies  CBC:  Recent Labs Lab 04/11/17 1055 04/14/17 0422 04/16/17 0356 04/17/17 0416  WBC 8.7 7.1 5.7 7.4  NEUTROABS 6.0  --   --   --   HGB 11.8* 10.0* 9.8* 10.9*  HCT 34.8* 29.0* 29.8* 32.5*  MCV 107.4* 108.6* 112.0* 109.8*  PLT 239 172 191 140*   Basic Metabolic Panel:  Recent Labs Lab 04/11/17 1055 04/12/17 0342 04/13/17 1340 04/14/17 0422 04/16/17 0356 04/17/17 0416  NA 134* 138  --  137 136 132*  K 3.5 3.0*  --  4.3 5.1 3.8  CL 97* 103  --  105 106 97*  CO2 21* 23  --  21* 22 24  GLUCOSE 109* 95  --  105* 98 126*  BUN 6 5*  --  6 5* 7  CREATININE 0.67 0.68  --  0.65 0.65 0.62  CALCIUM 8.4* 8.3*  --  8.2* 8.2* 8.3*  MG  --   --  1.5*  --   --  1.6*   GFR: Estimated Creatinine Clearance: 86.8 mL/min (by C-G formula based on SCr of 0.62 mg/dL). Liver Function Tests:  Recent Labs Lab 04/11/17 1055 04/14/17 0422 04/16/17 0356  AST 70* 77* 99*  ALT 43 39 39  ALKPHOS 240* 211* 223*  BILITOT 1.3* 1.3* 1.3*  PROT 6.2* 6.3* 7.1  ALBUMIN 2.7* 2.2* 1.9*   No results for input(s): LIPASE, AMYLASE in the last 168 hours. No results for input(s): AMMONIA in the last 168 hours. Coagulation Profile: No results for input(s): INR, PROTIME in the last 168 hours. Cardiac Enzymes:  Recent Labs Lab 04/13/17 1340  CKTOTAL 21*   BNP (last 3 results) No results for input(s): PROBNP in the last 8760 hours. HbA1C: No results for input(s): HGBA1C in the last 72 hours. CBG: No results for input(s): GLUCAP in the last 168 hours. Lipid Profile: No results for input(s): CHOL, HDL, LDLCALC, TRIG, CHOLHDL, LDLDIRECT in the last 72 hours. Thyroid Function Tests: No results for input(s): TSH, T4TOTAL, FREET4, T3FREE, THYROIDAB in the last 72 hours. Anemia Panel: No results for input(s): VITAMINB12, FOLATE, FERRITIN, TIBC, IRON, RETICCTPCT in the last 72 hours. Sepsis Labs: No results for input(s): PROCALCITON, LATICACIDVEN in the last 168 hours.  Recent  Results (from the past 240 hour(s))  Gram stain     Status: None   Collection Time: 04/13/17 11:50 AM  Result Value Ref Range Status   Specimen Description CSF  Final   Special Requests NONE  Final   Gram Stain   Final    WBC PRESENT,BOTH PMN AND MONONUCLEAR NO ORGANISMS SEEN CYTOSPIN Gram Stain Report Called to,Read Back By and Verified With: BODKIN,K. RN @1403  10.24.18 BY NMCCOY    Report Status 04/13/2017 FINAL  Final     Radiology Studies: No results found.   Scheduled Meds: . atorvastatin  40 mg Oral Daily  . feeding supplement (ENSURE ENLIVE)  237 mL Oral BID BM  .  gabapentin  600 mg Oral QID  . heparin  5,000 Units Subcutaneous Q8H  . Immune Globulin 10%  400 mg/kg Intravenous Q24 Hr x 5  . mouth rinse  15 mL Mouth Rinse q12n4p  . metoprolol tartrate  50 mg Oral BID  . mirtazapine  15 mg Oral QHS  . multivitamin with minerals  1 tablet Oral Daily  . nicotine  21 mg Transdermal Daily  . pantoprazole  40 mg Oral Daily  . QUEtiapine  400 mg Oral QHS   Continuous Infusions: . lactated ringers 75 mL/hr at 04/16/17 2022     LOS: 6 days    Signature  Susa Raring M.D on 04/17/2017 at 11:09 AM  Between 7am to 7pm - Pager - [phone] ( page via [email or web address], text pages only, please mention full 10 digit call back number).  After 7pm go to [email or web address] - password [facility]

## 2017-04-17 NOTE — Progress Notes (Signed)
Subjective: At this point patient states that she does not feel as though she has made any improvements.  The paresthesias and hyperesthesias that she is feeling have not changed and are on the same distribution.  Her last IVIG is scheduled for today in the afternoon.  Exam: Vitals:   04/16/17 2014 04/17/17 0518  BP: (!) 115/96 (!) 137/106  Pulse: (!) 118 (!) 123  Resp: 16 20  Temp: 98.7 F (37.1 C) 98.7 F (37.1 C)  SpO2: 98% 98%    HEENT-  Normocephalic, no lesions, without obvious abnormality.  Normal external eye and conjunctiva.  Normal TM's bilaterally.  Normal auditory canals and external ears. Normal external nose, mucus membranes and septum.  Normal pharynx. Cardiovascular- S1, S2 normal, pulses palpable throughout   Lungs- chest clear, no wheezing, rales, normal symmetric air entry, Heart exam - S1, S2 normal, no murmur, no gallop, rate regular Abdomen- normal findings: bowel sounds normal    Neuro: MS: Awake, alert, interactive and appropriate CN: Pupils equal round and reactive to light, sharp movements intact Motor:  Right :Upper extremityLeft: Upper extremity 5/5 deltoid5/5 deltoid 4-/5 tricep4-/5 tricep 4/5 biceps5/5 biceps  4/5wrist flexion4/5 wrist flexion 4/5 wrist extension4-/5 wrist extension 4-/5 interossei4-/5 interosei Lower extremityLower extremity 4/5 hip flexor4/5 hip flexor 5/5 hip  adductors5/5 hip adductors 5/5 hip abductors5/5 hip abductors 5/5 quadricep5/5 quadriceps  5/5 hamstrings5/5 hamstrings 5/5 plantar flexion 5/5 plantar flexion 5/5 plantar extension5/5 plantar extension DTR: She has minimal to no biceps or ankle reflexes noted today on my exam, intact brachioradialis and no knee reflexes   Medications:  Scheduled: . atorvastatin  40 mg Oral Daily  . feeding supplement (ENSURE ENLIVE)  237 mL Oral BID BM  . fenofibrate  160 mg Oral Daily  . gabapentin  600 mg Oral QID  . heparin  5,000 Units Subcutaneous Q8H  . Immune Globulin 10%  400 mg/kg Intravenous Q24 Hr x 5  . mouth rinse  15 mL Mouth Rinse q12n4p  . metoprolol tartrate  50 mg Oral BID  . mirtazapine  15 mg Oral QHS  . multivitamin with minerals  1 tablet Oral Daily  . nicotine  21 mg Transdermal Daily  . pantoprazole  40 mg Oral Daily  . QUEtiapine  400 mg Oral QHS    Pertinent Labs/Diagnostics: None  No results found.   Felicie Morn PA-C [facility] [phone]  Impression: 56 year old female with progressive paresthesias/weakness. Overall, with absent reflexes, and distribution of numbness, likely that a peripheral process is more likely than a central one. There is no evidence of pleocytosis on LP.  Though the protein is not elevated, I do not think that this excludes Guillain-Barr syndrome. Some type of inflammatory lumbosacral plexopathy would be another consideration, but this would not explain her upper extremity sensory change. 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  With  absent knee reflexes(otherwise preserved on admission) and her urinary retention, I do think there is objective evidence of deficit and with other etiologies ruled out, I favored treating for GBS.  An EMG would be most helpful, and if she continues to not have any response to IVIG --with last dose being this afternoon, prior to other therapies I think she would need to be transferred to another institution which could perform this.  Recommendations: 1) With continued worsening, I do think that she may benefit from transfer to an institution where EMG could help guide further therapy. She may  yet have a response to IVIG, but her reflexes are diminished even compared to yesterday. Certainly, I would not favor PLEX or other treatment without more ancillary data.    Ritta SlotMcNeill Arasely Akkerman, MD [facility] [phone]  If 7pm- 7am, please page neurology on call as listed in AMION.  04/17/2017, 11:32 AM

## 2017-04-17 NOTE — Progress Notes (Signed)
NIF was -36 good pt effort

## 2017-04-17 NOTE — Progress Notes (Signed)
NIF -38 with good patient effort.

## 2017-04-18 ENCOUNTER — Inpatient Hospital Stay (HOSPITAL_COMMUNITY): Payer: Medicaid Other

## 2017-04-18 LAB — CBC
HCT: 32.7 % — ABNORMAL LOW (ref 36.0–46.0)
Hemoglobin: 10.7 g/dL — ABNORMAL LOW (ref 12.0–15.0)
MCH: 36 pg — ABNORMAL HIGH (ref 26.0–34.0)
MCHC: 32.7 g/dL (ref 30.0–36.0)
MCV: 110.1 fL — ABNORMAL HIGH (ref 78.0–100.0)
PLATELETS: 152 10*3/uL (ref 150–400)
RBC: 2.97 MIL/uL — ABNORMAL LOW (ref 3.87–5.11)
RDW: 14 % (ref 11.5–15.5)
WBC: 4.4 10*3/uL (ref 4.0–10.5)

## 2017-04-18 LAB — BASIC METABOLIC PANEL
Anion gap: 8 (ref 5–15)
BUN: 8 mg/dL (ref 6–20)
CO2: 26 mmol/L (ref 22–32)
CREATININE: 0.61 mg/dL (ref 0.44–1.00)
Calcium: 8.2 mg/dL — ABNORMAL LOW (ref 8.9–10.3)
Chloride: 99 mmol/L — ABNORMAL LOW (ref 101–111)
GFR calc Af Amer: >60 mL/min (ref >60)
Glucose, Bld: 106 mg/dL — ABNORMAL HIGH (ref 65–99)
POTASSIUM: 3.2 mmol/L — AB (ref 3.5–5.1)
SODIUM: 133 mmol/L — AB (ref 135–145)

## 2017-04-18 LAB — MAGNESIUM: MAGNESIUM: 1.7 mg/dL (ref 1.7–2.4)

## 2017-04-18 MED ORDER — DOCUSATE SODIUM 100 MG PO CAPS
200.0000 mg | ORAL_CAPSULE | Freq: Two times a day (BID) | ORAL | Status: DC
  Administered 2017-04-18 – 2017-04-20 (×4): 200 mg via ORAL
  Filled 2017-04-18 (×5): qty 2

## 2017-04-18 MED ORDER — POTASSIUM CHLORIDE CRYS ER 20 MEQ PO TBCR
40.0000 meq | EXTENDED_RELEASE_TABLET | Freq: Four times a day (QID) | ORAL | Status: AC
  Administered 2017-04-18 (×2): 40 meq via ORAL
  Filled 2017-04-18 (×3): qty 2

## 2017-04-18 MED ORDER — OXYCODONE HCL 5 MG PO TABS
10.0000 mg | ORAL_TABLET | Freq: Four times a day (QID) | ORAL | Status: DC | PRN
  Administered 2017-04-18 – 2017-04-20 (×6): 10 mg via ORAL
  Filled 2017-04-18 (×6): qty 2

## 2017-04-18 MED ORDER — MAGNESIUM SULFATE IN D5W 1-5 GM/100ML-% IV SOLN
1.0000 g | Freq: Once | INTRAVENOUS | Status: AC
Start: 2017-04-18 — End: 2017-04-18
  Administered 2017-04-18: 1 g via INTRAVENOUS
  Filled 2017-04-18: qty 100

## 2017-04-18 MED ORDER — BISACODYL 5 MG PO TBEC
10.0000 mg | DELAYED_RELEASE_TABLET | Freq: Every day | ORAL | Status: DC
  Administered 2017-04-18: 10 mg via ORAL
  Filled 2017-04-18: qty 2

## 2017-04-18 NOTE — Progress Notes (Signed)
PROGRESS NOTE    Catherine Butler  ZOX:[id number] DOB: Apr 29, 1961 DOA: 04/11/2017 PCP: Loletta Specter, PA-C  Brief Narrative:    56 y.o. female, with past medical history significant for COPD, hypertension, anxiety and depression presenting today with 2 days history of urinary retention and stool incontinence. Patient complains of back pain with numbness in her lower extremities in addition to weakness and had multiple falls lately. Patient denies any burning urination, blood in the urine. No fever or chills. No history of flu or flu shot, lately. Patient reports history of upper extremity numbness in addition to lower extremity numbness for the last 1 year. In the emergency room she was also noted to have urinary retention with 1 L of urine catheterized.  MRI of the cervical spine showed no acute abnormality, mild degenerative spondylosis C3-C4-C5 and 6 without significant stenosis.  MRI of the lumbar spine no abnormal enhancement or acute abnormality.  MRI of the thoracic spine shows small disc protrusions at T7-T8 and 9 and old T4 compression fracture without bony retropulsion.  Neurology Dr. Melinda Crutch was consulted who recommended IR to perform LP to asses protein,cell count,differential and glucose.  Patient has a Foley catheter in place this was placed due to bladder distention.  Patient also has new onset urinary and bowel incontinence.  Plan DW Dr Amada Jupiter on 04-17-17, called [facility] and DW Dr Alphonzo Dublin Neuro on 04-17-17 @ 12.18pm who declined transfer stating management now is to observe only.    Subjective  - patient in bed resting and sleeping comfortably in no distress, denies any headache chest or abdominal pain, weakness is slightly improved, currently in no pain.    Assessment & Plan:   Paresthesias/weakness  - weakness motor in lower extremities and upper, some neuropathic pain as well, did develop bowel and bladder incontinence.  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  Continue supportive care and pain control, continue PT and increase activity, defer further management to neurology, likely will require SNF upon discharge. NIF - 40 which is better than before.    Initially neurology has recommended transfer to [facility] neurologist had detailed discussions with me and Dr. Amada Jupiter, after which [facility] neurologist Dr. Alphonzo Dublin declined transfer as according to him management at this point is just observation.  Dr. Amada Jupiter is satisfied with keeping patient here continuing PT and supportive care.  Likely will require SNF.   COPD-appears to be stable, continue as needed Ventolin  Hypertension-has been started on Lopressor with good control.  Gastroesophageal reflux disease - on PPI  Asthma- stable  Dyslipidemia -on Lipitor, triglycerides extremely high, have added TriCor as well.  Anxiety/depression - on Seroquel which will be continued    DVT prophylaxis: Heparin Code Status:full Family Communication:  none Disposition Plan: As per neurology Consultants: neuro  Procedures: none Antimicrobials: none    Objective: Vitals:   04/17/17 1717 04/17/17 1732 04/17/17 2200 04/18/17 0629  BP: 125/90 (!) 118/102 (!) 130/107 133/89  Pulse: 97 98 (!) 103 90  Resp: 19 20 18 20   Temp: 98.2 F (36.8 C) 98.2 F (36.8 C) 98.2 F (36.8 C) 98 F (36.7 C)  TempSrc: Oral Oral Oral Oral  SpO2: 97% 96% 96% 97%  Weight:      Height:        Intake/Output Summary (Last 24 hours) at 04/18/17 1133 Last data filed  at 04/18/17 [id number]  Gross per 24 hour  Intake                0 ml  Output             1300 ml  Net            -1300 ml   Filed Weights   04/11/17 1002 04/11/17 2026  Weight: 79.4 kg (175 lb) 82.1 kg (181 lb)    Examination:  Awake Alert, Oriented X 3, No new F.N deficits, Normal  affect [place].AT,PERRAL Supple Neck,No JVD, No cervical lymphadenopathy appriciated.  Symmetrical Chest wall movement, Good air movement bilaterally, CTAB RRR,No Gallops, Rubs or new Murmurs, No Parasternal Heave +ve B.Sounds, Abd Soft, No tenderness, No organomegaly appriciated, No rebound - guarding or rigidity. No Cyanosis, Clubbing or edema, No new Rash or bruise   Data Reviewed: I have personally reviewed following labs and imaging studies  CBC:  Recent Labs Lab 04/14/17 0422 04/16/17 0356 04/17/17 0416 04/18/17 0425  WBC 7.1 5.7 7.4 4.4  HGB 10.0* 9.8* 10.9* 10.7*  HCT 29.0* 29.8* 32.5* 32.7*  MCV 108.6* 112.0* 109.8* 110.1*  PLT 172 191 140* 152   Basic Metabolic Panel:  Recent Labs Lab 04/12/17 0342 04/13/17 1340 04/14/17 0422 04/16/17 0356 04/17/17 0416 04/18/17 0425  NA 138  --  137 136 132* 133*  K 3.0*  --  4.3 5.1 3.8 3.2*  CL 103  --  105 106 97* 99*  CO2 23  --  21* 22 24 26   GLUCOSE 95  --  105* 98 126* 106*  BUN 5*  --  6 5* 7 8  CREATININE 0.68  --  0.65 0.65 0.62 0.61  CALCIUM 8.3*  --  8.2* 8.2* 8.3* 8.2*  MG  --  1.5*  --   --  1.6* 1.7   GFR: Estimated Creatinine Clearance: 86.8 mL/min (by C-G formula based on SCr of 0.61 mg/dL). Liver Function Tests:  Recent Labs Lab 04/14/17 0422 04/16/17 0356  AST 77* 99*  ALT 39 39  ALKPHOS 211* 223*  BILITOT 1.3* 1.3*  PROT 6.3* 7.1  ALBUMIN 2.2* 1.9*   No results for input(s): LIPASE, AMYLASE in the last 168 hours. No results for input(s): AMMONIA in the last 168 hours. Coagulation Profile: No results for input(s): INR, PROTIME in the last 168 hours. Cardiac Enzymes:  Recent Labs Lab 04/13/17 1340  CKTOTAL 21*   BNP (last 3 results) No results for input(s): PROBNP in the last 8760 hours. HbA1C: No results for input(s): HGBA1C in the last 72 hours. CBG: No results for input(s): GLUCAP in the last 168 hours. Lipid Profile: No results for input(s): CHOL, HDL, LDLCALC, TRIG, CHOLHDL,  LDLDIRECT in the last 72 hours. Thyroid Function Tests: No results for input(s): TSH, T4TOTAL, FREET4, T3FREE, THYROIDAB in the last 72 hours. Anemia Panel: No results for input(s): VITAMINB12, FOLATE, FERRITIN, TIBC, IRON, RETICCTPCT in the last 72 hours. Sepsis Labs: No results for input(s): PROCALCITON, LATICACIDVEN in the last 168 hours.  Recent Results (from the past 240 hour(s))  Gram stain     Status: None   Collection Time: 04/13/17 11:50 AM  Result Value Ref Range Status   Specimen Description CSF  Final   Special Requests NONE  Final   Gram Stain   Final    WBC PRESENT,BOTH PMN AND MONONUCLEAR NO ORGANISMS SEEN CYTOSPIN Gram Stain Report Called to,Read Back By and Verified With: BODKIN,K. RN @1403  10.24.18 BY NMCCOY  Report Status 04/13/2017 FINAL  Final     Radiology Studies: No results found.   Scheduled Meds: . atorvastatin  40 mg Oral Daily  . feeding supplement (ENSURE ENLIVE)  237 mL Oral BID BM  . fenofibrate  160 mg Oral Daily  . gabapentin  600 mg Oral QID  . heparin  5,000 Units Subcutaneous Q8H  . metoprolol tartrate  50 mg Oral BID  . mirtazapine  15 mg Oral QHS  . multivitamin with minerals  1 tablet Oral Daily  . nicotine  21 mg Transdermal Daily  . pantoprazole  40 mg Oral Daily  . potassium chloride  40 mEq Oral Q6H  . QUEtiapine  400 mg Oral QHS   Continuous Infusions:    LOS: 7 days    Signature  Susa Raring M.D on 04/18/2017 at 11:33 AM  Between 7am to 7pm - Pager - [phone] ( page via [email or web address], text pages only, please mention full 10 digit call back number).  After 7pm go to [email or web address] - password [email or web address]

## 2017-04-18 NOTE — Progress Notes (Signed)
NIF -35 with good patient effort

## 2017-04-18 NOTE — Progress Notes (Signed)
NIF >-40, good pt effort 

## 2017-04-18 NOTE — Progress Notes (Signed)
Neurology Progress Note   S:// No acute overnight events. Noted to have decline in function requiring max to total assist to ambulate 4 feet with rolling walker per rehab admissions. Skilled nursing admission recommended.  O:// Current vital signs: BP 133/89 (BP Location: Right Arm)   Pulse 90   Temp 98 F (36.7 C) (Oral)   Resp 20   Ht 5\' 6"  (1.676 m)   Wt 82.1 kg (181 lb)   LMP 02/22/2006   SpO2 94%   BMI 29.21 kg/m  Vital signs in last 24 hours: Temp:  [98 F (36.7 C)-98.5 F (36.9 C)] 98 F (36.7 C) (10/29 0629) Pulse Rate:  [90-104] 90 (10/29 1146) Resp:  [18-20] 20 (10/29 0629) BP: (109-133)/(81-107) 133/89 (10/29 0629) SpO2:  [94 %-97 %] 94 % (10/29 1146) HEENT-  Normocephalic, no lesions, without obvious abnormality.  Normal external eye and conjunctiva.  Normal TM's bilaterally.  Normal auditory canals and external ears. Normal external nose, mucus membranes and septum.  Normal pharynx. Cardiovascular- S1, S2 normal, pulses palpable throughout   Lungs- chest clear, no wheezing, rales, normal symmetric air entry, Heart exam - S1, S2 normal, no murmur, no gallop, rate regular Abdomen- normal findings: bowel sounds normal Neuro: MS: Awake, alert, interactive and appropriate ZO:XWRUEA equal round and reactive to light, sharp movements intact Motor:  Right :Upper extremityLeft: Upper extremity 5/5 deltoid5/5 deltoid 4-/5 tricep4-/5 tricep 4/5 biceps5/5 biceps  4/5wrist flexion4/5 wrist flexion 4/5 wrist extension4-/5 wrist extension 4-/5  interossei4-/5 interosei Lower extremityLower extremity 4/5 hip flexor4/5 hip flexor 5/5 hip adductors5/5 hip adductors 5/5 hip abductors5/5 hip abductors 5/5 quadricep5/5 quadriceps  5/5 hamstrings5/5 hamstrings 5/5 plantar flexion 5/5 plantar flexion 5/5 plantar extension5/5 plantar extension Sensory: parestheisas in glove stocking pattern. Coordination-intact finger nose finger laterally DTR:0 all over  Medications  Current Facility-Administered Medications:  .  acetaminophen (TYLENOL) tablet 650 mg, 650 mg, Oral, Q6H PRN, 650 mg at 04/18/17 1337 **OR** [DISCONTINUED] acetaminophen (TYLENOL) suppository 650 mg, 650 mg, Rectal, Q6H PRN, Carron Curie, MD .  albuterol (PROVENTIL) (2.5 MG/3ML) 0.083% nebulizer solution 3 mL, 3 mL, Inhalation, Q6H PRN, Carron Curie, MD .  atorvastatin (LIPITOR) tablet 40 mg, 40 mg, Oral, Daily, Abrol, Nayana, MD, 40 mg at 04/18/17 1100 .  bisacodyl (DULCOLAX) EC tablet 10 mg, 10 mg, Oral, Daily, Thedore Mins, Prashant K, MD .  docusate sodium (COLACE) capsule 200 mg, 200 mg, Oral, BID, Singh, Prashant K, MD .  feeding supplement (ENSURE ENLIVE) (ENSURE ENLIVE) liquid 237 mL, 237 mL, Oral, BID BM, Carron Curie, MD, 237 mL at 04/17/17 0932 .  fenofibrate tablet 160 mg, 160 mg, Oral, Daily, Leroy Sea, MD, 160 mg at 04/18/17 1100 .  gabapentin (NEURONTIN) capsule 600 mg, 600 mg, Oral, QID, Rejeana Brock, MD, 600 mg at 04/18/17 1100 .   heparin injection 5,000 Units, 5,000 Units, Subcutaneous, Q8H, Rejeana Brock, MD, 5,000 Units at 04/18/17 0612 .  metoprolol tartrate (LOPRESSOR) tablet 50 mg, 50 mg, Oral, BID, Leroy Sea, MD, 50 mg at 04/18/17 1100 .  mirtazapine (REMERON) tablet 15 mg, 15 mg, Oral, QHS, Carron Curie, MD, 15 mg at 04/17/17 2139 .  multivitamin with minerals tablet 1 tablet, 1 tablet, Oral, Daily, Alwyn Ren, MD, 1 tablet at 04/18/17 1100 .  nicotine (NICODERM CQ - dosed in mg/24 hours) patch 21 mg, 21 mg, Transdermal, Daily, Bodenheimer, Charles A, NP, 21 mg at 04/18/17 1100 .  [DISCONTINUED] ondansetron (ZOFRAN) tablet 4 mg, 4 mg, Oral, Q6H PRN, 4  mg at 04/18/17 0312 **OR** ondansetron (ZOFRAN) injection 4 mg, 4 mg, Intravenous, Q6H PRN, Carron CurieHijazi, Ali, MD, 4 mg at 04/18/17 1336 .  oxyCODONE (Oxy IR/ROXICODONE) immediate release tablet 10 mg, 10 mg, Oral, Q6H PRN, Leroy SeaSingh, Prashant K, MD, 10 mg at 04/18/17 1336 .  pantoprazole (PROTONIX) EC tablet 40 mg, 40 mg, Oral, Daily, Hijazi, Ali, MD, 40 mg at 04/18/17 1100 .  potassium chloride SA (K-DUR,KLOR-CON) CR tablet 40 mEq, 40 mEq, Oral, Q6H, Leroy SeaSingh, Prashant K, MD, 40 mEq at 04/18/17 0756 .  QUEtiapine (SEROQUEL) tablet 400 mg, 400 mg, Oral, QHS, Hijazi, Karie MainlandAli, MD, 400 mg at 04/17/17 2138 Labs CBC    Component Value Date/Time   WBC 4.4 04/18/2017 0425   RBC 2.97 (L) 04/18/2017 0425   HGB 10.7 (L) 04/18/2017 0425   HGB 13.5 01/18/2017 1411   HCT 32.7 (L) 04/18/2017 0425   HCT 40.3 01/18/2017 1411   PLT 152 04/18/2017 0425   PLT 332 01/18/2017 1411   MCV 110.1 (H) 04/18/2017 0425   MCV 104 (H) 01/18/2017 1411   MCH 36.0 (H) 04/18/2017 0425   MCHC 32.7 04/18/2017 0425   RDW 14.0 04/18/2017 0425   RDW 13.7 01/18/2017 1411   LYMPHSABS 1.5 04/11/2017 1055   LYMPHSABS 2.6 01/18/2017 1411   MONOABS 1.2 (H) 04/11/2017 1055   EOSABS 0.0 04/11/2017 1055   EOSABS 0.1 01/18/2017 1411   BASOSABS 0.0 04/11/2017 1055   BASOSABS 0.1 01/18/2017  1411    CMP     Component Value Date/Time   NA 133 (L) 04/18/2017 0425   NA 138 01/18/2017 1411   K 3.2 (L) 04/18/2017 0425   CL 99 (L) 04/18/2017 0425   CO2 26 04/18/2017 0425   GLUCOSE 106 (H) 04/18/2017 0425   BUN 8 04/18/2017 0425   BUN 5 (L) 01/18/2017 1411   CREATININE 0.61 04/18/2017 0425   CALCIUM 8.2 (L) 04/18/2017 0425   PROT 7.1 04/16/2017 0356   PROT 6.7 01/18/2017 1411   ALBUMIN 1.9 (L) 04/16/2017 0356   ALBUMIN 3.6 01/18/2017 1411   AST 99 (H) 04/16/2017 0356   ALT 39 04/16/2017 0356   ALKPHOS 223 (H) 04/16/2017 0356   BILITOT 1.3 (H) 04/16/2017 0356   BILITOT 0.4 01/18/2017 1411   GFRNONAA >60 04/18/2017 0425   GFRAA >60 04/18/2017 0425    glycosylated hemoglobin  Lipid Panel     Component Value Date/Time   CHOL 227 (H) 04/14/2017 0422   CHOL 277 (H) 02/01/2017 1000   TRIG 600 (H) 04/14/2017 0422   HDL 17 (L) 04/14/2017 0422   HDL 43 02/01/2017 1000   CHOLHDL 13.4 04/14/2017 0422   VLDL UNABLE TO CALCULATE IF TRIGLYCERIDE OVER 400 mg/dL 45/40/981110/25/2018 91470422   LDLCALC UNABLE TO CALCULATE IF TRIGLYCERIDE OVER 400 mg/dL 82/95/621310/25/2018 [id number]   LDLCALC 155 (H) 02/01/2017 1000   CSF results 04/13/2017 Glucose 74, RBC 252, WBC 3, protein 34.  Imaging I have reviewed images in epic and the results pertinent to this consultation are: MRI of the cervical and lumbar spine No acute abnormalities Mild degenerative disc disease at C3-4 C5-6 levels No acute abnormality in the lumbar spine noted MRI thoracic spine no acute abnormality Mild DJD at T7-8 and T8-9 Epidural lipomatosis Mild remote superior endplate compression fracture of T4 without bony retropulsion  CT chest with contrast was done on 04/18/2015 that revealed a 6 cm geographic area of airspace disease in the posterior right lower lobe containing air bronchograms which most likely was thought to  represent acute pneumonia.  However given some asymmetry of density in the region as well as size of the overall  abnormality, follow-up chest x-rays are recommended after appropriate treatment for pneumonia to determine resolution.  Assessment:  56 year old woman with progressive paresthesias and weakness with a glove and stocking pattern of paresthesias as well as proximal hip flexors and proximal upper extremity weakness along with weakness of the small muscles of the hand with a normal spinal tap and cervical thoracic and lumbar imaging unrevealing of any etiology. On my exam, she does have some hip flexor weakness but her distal lower extremity muscles are all 5/5. An EMG would be really very helpful at this time.  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 She has received 5 days of IVIG with minimal improvement I would expect the IVIG to help over the next days to weeks  Recommendations -I would recommend aggressive PT and OT -I would recommend outpatient follow-up with Dr. Nita Sickle (Neuromuscular neurologist) at [facility] neurology for an outpatient evaluation and EMG nerve conduction studies -which might be of most yield in terms of directing [place] towards the diagnosis -At this point I do not think there is any merit to transfer her as an inpatient to another hospital -If she requires further IVIG treatments or immunomodulatory/immunosuppressive therapy, that decision can be made after an outpatient evaluation by neuromuscular specialist -Can consider doing a CT of the chest to evaluate for any possible malignancy and associated paraneoplastic phenomenon that could be potential etiology for this presentation -I have discussed this plan in person with the primary hospitalist Dr. Thedore Mins. I will also attempt to communicate with Dr. Allena Katz  to coordinate a referral. Neurology will be available as needed.  Please call with questions.   Milon Dikes, MD [facility] [phone] If 7pm to 7am, please call on call as listed on AMION.

## 2017-04-18 NOTE — Progress Notes (Signed)
Physical Therapy Treatment Patient Details Name: Catherine Butler MRN: [id number] DOB: 11/27/1960 Today's Date: 04/18/2017    History of Present Illness 56 y.o. female, with past medical history significant for COPD, hypertension, anxiety and depression admitted with 2 days history of urinary retention and stool incontinence. Patient complains of back pain with numbness in her lower extremities in addition to weakness and had multiple falls lately. Imaging of spine unremarkable.  Pt reporting increasing weakness, decreased motor coordination and sensory changes all 4s since admission..  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  Still requires + 2 assist and present with MAX ataxia with inability to functionally amb.  See mobility details below.    Follow Up Recommendations  CIR     Equipment Recommendations  None recommended by PT    Recommendations for Other Services OT consult     Precautions / Restrictions Precautions Precautions: Fall Precaution Comments: history of falls  B LE parethesis/weakness. BUE abnormal sensation and decreased motor control especially on L Restrictions Weight Bearing Restrictions: No    Mobility  Bed Mobility Overal bed mobility: Needs Assistance Bed Mobility: Supine to Sit     Supine to sit: Supervision;Min guard     General bed mobility comments: difficulty functionally useing B UE's to control movement and grasp bed rails but with increased time was able to perform at Supervision/MinGuard   Transfers Overall transfer level: Needs assistance Equipment used: 2 person hand held assist;Rolling walker (2 wheeled) Transfers: Sit to/from Stand Sit to Stand: Max assist;+2 physical assistance;+2 safety/equipment Stand pivot transfers: Max assist;Total assist;+2 physical assistance;+2 safety/equipment       General transfer comment: great difficulty self rising  and required + 2 side by side assist while blocking B knees to prevent forward scooting to far and slipping off bed.  Once upright, pt had great difficulty maintaining a still staic standing posture.  Pt present with Mod ataxia and involuntary trunk stability.  Required + 2 assist to prevent LOB all planes.  Pt with poor ability to self coorect.    Ambulation/Gait Ambulation/Gait assistance: Max assist;Total assist;+2 physical assistance;+2 safety/equipment Ambulation Distance (Feet): 4 Feet Assistive device: Rolling walker (2 wheeled) Gait Pattern/deviations: Step-to pattern;Ataxic;Shuffle Gait velocity: decreased    General Gait Details: very limiteed amb distance due to severe ataxia with poor motor control.  LOB all planes as well as poor trunk control.  Pt still reports "needles" pain and MAX c/o fatigue.  HIGH FALL RISK.     Stairs            Wheelchair Mobility    Modified Rankin (Stroke Patients Only)       Balance                                            Cognition Arousal/Alertness: Awake/alert Behavior During Therapy: WFL for tasks assessed/performed Overall Cognitive Status: Within Functional Limits for tasks assessed                                 General Comments: "tired" poor sleep last nigh       Exercises      General Comments        Pertinent Vitals/Pain Pain Assessment: Faces Faces Pain  Scale: Hurts a little bit Pain Location: B LE and B UE "feels like needles" Pain Descriptors / Indicators: Pins and needles Pain Intervention(s): Monitored during session;Repositioned    Home Living                      Prior Function            PT Goals (current goals can now be found in the care plan section) Progress towards PT goals: Progressing toward goals    Frequency    Min 3X/week      PT Plan Current plan remains appropriate    Co-evaluation              AM-PAC PT "6 Clicks" Daily  Activity  Outcome Measure  Difficulty turning over in bed (including adjusting bedclothes, sheets and blankets)?: Unable Difficulty moving from lying on back to sitting on the side of the bed? : Unable Difficulty sitting down on and standing up from a chair with arms (e.g., wheelchair, bedside commode, etc,.)?: Unable Help needed moving to and from a bed to chair (including a wheelchair)?: Total Help needed walking in hospital room?: Total Help needed climbing 3-5 steps with a railing? : Total 6 Click Score: 6    End of Session Equipment Utilized During Treatment: Gait belt Activity Tolerance: Patient limited by fatigue;Patient limited by pain;Other (comment) Patient left: in chair;with chair alarm set Nurse Communication: Mobility status PT Visit Diagnosis: Unsteadiness on feet (R26.81);Repeated falls (R29.6);Muscle weakness (generalized) (M62.81);Difficulty in walking, not elsewhere classified (R26.2)     Time: 1010-1029 PT Time Calculation (min) (ACUTE ONLY): 19 min  Charges:  $Gait Training: 8-22 mins                    G Codes:       {Brylie Sneath  PTA [facility]  Acute  Rehab Pager      [phone]

## 2017-04-18 NOTE — Progress Notes (Signed)
Rehab admissions - Noted decline in function and requiring max to total assist to ambulate 4 feet RW.  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  Therefore, likely will need SNF placement for continued therapies.  Call me for questions.  #[phone]

## 2017-04-19 LAB — MAGNESIUM: MAGNESIUM: 1.6 mg/dL — AB (ref 1.7–2.4)

## 2017-04-19 LAB — BASIC METABOLIC PANEL
ANION GAP: 11 (ref 5–15)
BUN: 11 mg/dL (ref 6–20)
CO2: 26 mmol/L (ref 22–32)
CREATININE: 0.73 mg/dL (ref 0.44–1.00)
Calcium: 8.5 mg/dL — ABNORMAL LOW (ref 8.9–10.3)
Chloride: 95 mmol/L — ABNORMAL LOW (ref 101–111)
GFR calc non Af Amer: >60 mL/min (ref >60)
Glucose, Bld: 112 mg/dL — ABNORMAL HIGH (ref 65–99)
POTASSIUM: 3.8 mmol/L (ref 3.5–5.1)
SODIUM: 132 mmol/L — AB (ref 135–145)

## 2017-04-19 MED ORDER — PROMETHAZINE HCL 25 MG/ML IJ SOLN: 12.5000 mg | Freq: Four times a day (QID) | INTRAMUSCULAR | Status: DC | PRN

## 2017-04-19 NOTE — Progress Notes (Signed)
Nutrition Follow-up  DOCUMENTATION CODES:   Non-severe (moderate) malnutrition in context of chronic illness  INTERVENTION:   Continue Ensure Enlive po BID, each supplement provides 350 kcal and 20 grams of protein Continue Multivitamin with minerals daily  RD will continue to monitor plan  NUTRITION DIAGNOSIS:   Malnutrition (moderate) related to chronic illness, nausea (GERD) as evidenced by percent weight loss, energy intake < or equal to 75% for > or equal to 1 month.  Ongoing.  GOAL:   Patient will meet greater than or equal to 90% of their needs  Not meeting.  MONITOR:   PO intake, Supplement acceptance, Labs, Weight trends, I & O's  ASSESSMENT:   56 y.o. female, with past medical history significant for COPD, hypertension, anxiety and depression presenting today with 2 days history of urinary retention and stool incontinence. Patient complains of back pain with numbness in her lower extremities in addition to weakness and had multiple falls lately. Patient denies any burning urination, blood in the urine. No fever or chills. No history of flu or flu shot, lately. Patient reports history of upper extremity numbness in addition to lower extremity numbness for the last 1 year. In the emergency room she was also noted to have urinary retention with 1 L of urine catheterized.  Patient continues to eat poorly. Drinking ~1 Ensure daily. Pt continues to decline mobility-wise per neurology note. 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  Pt needs another weight recorded to monitor weight trends. Last recorded 10/22.  Medications: dulcolax tablet daily, colace capsule BID, Remeron tablet daily, Multivitamin with minerals daily, Protonix tablet daily, IV Zofran PRN Labs reviewed: Low Na, Mg TG: 600 mg/dL   Diet Order:  Diet regular Room service appropriate? Yes; Fluid consistency: Thin  EDUCATION NEEDS:   Education needs addressed (Provided GERD  handout)  Skin:  Skin Assessment: Reviewed, no issues  Last BM:     Height:   Ht Readings from Last 1 Encounters:  04/11/17 5\' 6"  (1.676 m)    Weight:   Wt Readings from Last 1 Encounters:  04/11/17 181 lb (82.1 kg)    Ideal Body Weight:  59.1 kg  BMI:  Body mass index is 29.21 kg/m.  Estimated Nutritional Needs:   Kcal:  1900-2100  Protein:  80-90g  Fluid:  2L/day  Tilda FrancoLindsey Leticia Mcdiarmid, MS, RD, LDN [facility] Clinical Dietitian Pager: [phone] After Hours Pager: [phone]

## 2017-04-19 NOTE — Progress Notes (Addendum)
PROGRESS NOTE    Catherine Butler  ZOX:[id number] DOB: 1960-09-25 DOA: 04/11/2017 PCP: Loletta Specter, PA-C  Brief Narrative:    56 y.o. female, with past medical history significant for COPD, hypertension, anxiety and depression presenting today with 2 days history of urinary retention and stool incontinence. Patient complains of back pain with numbness in her lower extremities in addition to weakness and had multiple falls lately. Patient denies any burning urination, blood in the urine. No fever or chills. No history of flu or flu shot, lately. Patient reports history of upper extremity numbness in addition to lower extremity numbness for the last 1 year. In the emergency room she was also noted to have urinary retention with 1 L of urine catheterized.  MRI of the cervical spine showed no acute abnormality, mild degenerative spondylosis C3-C4-C5 and 6 without significant stenosis.  MRI of the lumbar spine no abnormal enhancement or acute abnormality.  MRI of the thoracic spine shows small disc protrusions at T7-T8 and 9 and old T4 compression fracture without bony retropulsion.  Neurology Dr. Melinda Crutch was consulted who recommended IR to perform LP to asses protein,cell count,differential and glucose.  Patient has a Foley catheter in place this was placed due to bladder distention.  Patient also has new onset urinary and bowel incontinence.  Plan DW Dr Amada Jupiter on 04-17-17, called [facility] and DW Dr Alphonzo Dublin Neuro on 04-17-17 @ 12.18pm who declined transfer stating management now is to observe only.  Plan again discussed with neurologist Dr. Wilford Corner on 04/18/2017, continue observation, SNF, outpatient follow-up with [facility] neurologist Dr. Allena Katz for EMG.   Subjective  - Patient in bed, appears comfortable, denies any headache, no fever, no chest pain or pressure, no shortness of breath , no abdominal pain. No focal weakness.  Assessment & Plan:   Paresthesias/weakness  - weakness motor in lower  extremities and upper, some neuropathic pain as well, did develop bowel and bladder incontinence.  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  Continue supportive care and pain control, continue PT and increase activity, defer further management to neurology, likely will require SNF upon discharge. NIF between -35 and- 40 and staying stable.    Initially neurology has recommended transfer to [facility] neurologist had detailed discussions with me and Dr. Amada Jupiter, after which [facility] neurologist Dr. Alphonzo Dublin declined transfer as according to him management at this point is just observation.  Dr. Amada Jupiter is satisfied with keeping patient here continuing PT and supportive care.    Plan again discussed with neurologist Dr. Wilford Corner on 04/18/2017, continue observation, SNF, outpatient follow-up with [facility] neurologist Dr. Allena Katz for EMG.   Urinary retention.  Due to #1 above.  Foley in place likely will go to SNF with Foley and outpatient urology follow-up.   COPD-appears to be stable, continue as needed Ventolin  Hypertension-has been started on Lopressor with good control.  Gastroesophageal reflux disease - on PPI  Asthma- stable  Dyslipidemia -on Lipitor, triglycerides extremely high, have added TriCor as well.  Anxiety/depression - on Seroquel which will be continued    DVT prophylaxis: Heparin Code Status:full Family Communication:  none Disposition Plan: Significant bed elevated Consultants: neuro  Procedures: LP, EEG, MRI, CT head Antimicrobials: none    Objective: Vitals:   04/18/17 2008 04/19/17 0453 04/19/17 0745 04/19/17 1113  BP:  115/83  93/72  Pulse:  76  87  Resp:  19    Temp:  97.9 F (36.6 C)    TempSrc:  Oral    SpO2: 97% 98% 91% 96%  Weight:      Height:        Intake/Output Summary (Last 24 hours) at 04/19/17  1249 Last data filed at 04/19/17 1231  Gross per 24 hour  Intake          4217.26 ml  Output              650 ml  Net          3567.26 ml   Filed Weights   04/11/17 1002 04/11/17 2026  Weight: 79.4 kg (175 lb) 82.1 kg (181 lb)    Examination:  Awake Alert, Oriented X 3, No new F.N deficits, Normal affect, strength 4/5 in all 4 extremities with minimal to no deep tendon reflexes [place].AT,PERRAL Supple Neck,No JVD, No cervical lymphadenopathy appriciated.  Symmetrical Chest wall movement, Good air movement bilaterally, CTAB RRR,No Gallops, Rubs or new Murmurs, No Parasternal Heave +ve B.Sounds, Abd Soft, No tenderness, No organomegaly appriciated, No rebound - guarding or rigidity. No Cyanosis, Clubbing or edema, No new Rash or bruise   Data Reviewed: I have personally reviewed following labs and imaging studies  CBC:  Recent Labs Lab 04/14/17 0422 04/16/17 0356 04/17/17 0416 04/18/17 0425  WBC 7.1 5.7 7.4 4.4  HGB 10.0* 9.8* 10.9* 10.7*  HCT 29.0* 29.8* 32.5* 32.7*  MCV 108.6* 112.0* 109.8* 110.1*  PLT 172 191 140* 152   Basic Metabolic Panel:  Recent Labs Lab 04/13/17 1340 04/14/17 0422 04/16/17 0356 04/17/17 0416 04/18/17 0425 04/19/17 0415  NA  --  137 136 132* 133* 132*  K  --  4.3 5.1 3.8 3.2* 3.8  CL  --  105 106 97* 99* 95*  CO2  --  21* 22 24 26 26   GLUCOSE  --  105* 98 126* 106* 112*  BUN  --  6 5* 7 8 11   CREATININE  --  0.65 0.65 0.62 0.61 0.73  CALCIUM  --  8.2* 8.2* 8.3* 8.2* 8.5*  MG 1.5*  --   --  1.6* 1.7 1.6*   GFR: Estimated Creatinine Clearance: 86.8 mL/min (by C-G formula based on SCr of 0.73 mg/dL). Liver Function Tests:  Recent Labs Lab 04/14/17 0422 04/16/17 0356  AST 77* 99*  ALT 39 39  ALKPHOS 211* 223*  BILITOT 1.3* 1.3*  PROT 6.3* 7.1  ALBUMIN 2.2* 1.9*   No results for input(s): LIPASE, AMYLASE in the last 168 hours. No results for input(s): AMMONIA in the last 168 hours. Coagulation Profile: No results for input(s):  INR, PROTIME in the last 168 hours. Cardiac Enzymes:  Recent Labs Lab 04/13/17 1340  CKTOTAL 21*   BNP (last 3 results) No results for input(s): PROBNP in the last 8760 hours. HbA1C: No results for input(s): HGBA1C in the last 72 hours. CBG: No results for input(s): GLUCAP in the last 168 hours. Lipid Profile: No results for input(s): CHOL, HDL, LDLCALC, TRIG, CHOLHDL, LDLDIRECT in the last 72 hours. Thyroid Function Tests: No results for input(s): TSH, T4TOTAL, FREET4, T3FREE, THYROIDAB in the last 72 hours. Anemia Panel: No results for input(s): VITAMINB12, FOLATE, FERRITIN, TIBC, IRON, RETICCTPCT in the last 72 hours. Sepsis Labs: No results for input(s): PROCALCITON, LATICACIDVEN in the last 168 hours.  Recent Results (from the past 240 hour(s))  Gram stain     Status: None   Collection Time:  04/13/17 11:50 AM  Result Value Ref Range Status   Specimen Description CSF  Final   Special Requests NONE  Final   Gram Stain   Final    WBC PRESENT,BOTH PMN AND MONONUCLEAR NO ORGANISMS SEEN CYTOSPIN Gram Stain Report Called to,Read Back By and Verified With: BODKIN,K. RN @1403  10.24.18 BY [email or web address]    Report Status 04/13/2017 FINAL  Final     Radiology Studies: Ct Chest Wo Contrast  Result Date: 04/18/2017 CLINICAL DATA:  Smoker, COPD. Paresthesias. Evaluate for paraneoplastic syndrome etiology EXAM: CT CHEST WITHOUT CONTRAST TECHNIQUE: Multidetector CT imaging of the chest was performed following the standard protocol without IV contrast. COMPARISON:  Chest CT 12/16 FINDINGS: Cardiovascular: No significant vascular findings. Normal heart size. No pericardial effusion. Mediastinum/Nodes: No axillary supraclavicular adenopathy. No mediastinal adenopathy. Large hiatal hernia. Lungs/Pleura: Mild atelectasis at the LEFT lung base. No pulmonary mass. No suspicious nodules. Upper Abdomen: Limited view of the liver, kidneys, pancreas are unremarkable. Diffuse low-attenuation liver  consistent with hepatic steatosis. This finding is strikingly. Adrenal glands are normal. Musculoskeletal:  Healed rib fractures posterior LEFT. IMPRESSION: 1. No suspicious pulmonary nodule or mass. 2. No mediastinal adenopathy. 3. Large hiatal hernia. 4. Marked hepatic steatosis. Electronically Signed   By: Genevive Bi M.D.   On: 04/18/2017 20:04     Scheduled Meds: . atorvastatin  40 mg Oral Daily  . bisacodyl  10 mg Oral Daily  . docusate sodium  200 mg Oral BID  . feeding supplement (ENSURE ENLIVE)  237 mL Oral BID BM  . fenofibrate  160 mg Oral Daily  . gabapentin  600 mg Oral QID  . heparin  5,000 Units Subcutaneous Q8H  . metoprolol tartrate  50 mg Oral BID  . mirtazapine  15 mg Oral QHS  . multivitamin with minerals  1 tablet Oral Daily  . nicotine  21 mg Transdermal Daily  . pantoprazole  40 mg Oral Daily  . QUEtiapine  400 mg Oral QHS   Continuous Infusions:    LOS: 8 days    Signature  Susa Raring M.D on 04/19/2017 at 12:49 PM  Between 7am to 7pm - Pager - [phone] ( page via [email or web address], text pages only, please mention full 10 digit call back number).  After 7pm go to [email or web address] - password [facility]

## 2017-04-19 NOTE — Clinical Social Work Note (Signed)
Clinical Social Work Assessment  Patient Details  Name: Catherine Butler MRN: [id number] Date of Birth: 05-Jan-1961  Date of referral:  04/19/17               Reason for consult:  Discharge Planning                Permission sought to share information with:    Permission granted to share information::     Name::        Agency::     Relationship::     Contact Information:     Housing/Transportation Living arrangements for the past 2 months:  Single Family Home Source of Information:  Patient, Medical Team Patient Interpreter Needed:  None Criminal Activity/Legal Involvement Pertinent to Current Situation/Hospitalization:  No - Comment as needed Significant Relationships:  Parents Lives with:  Parents Do you feel safe going back to the place where you live?  Yes Need for family participation in patient care:  No (Coment)  Care giving concerns:  Pt from home where she resides with her elderly parents. Pt was independent with all ADLs/ambulation until this admission. Currently requiring max assist. Neurology following for presumed Jossie Ng. 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 She reports her mother would not physically be able to handle pt's own care too.    Social Worker assessment / plan:  CSW consulted to assist with DC planning- SNF. Met with pt at bedside. She is engaged and pleasant, if somewhat flat. Reports she feels depressed re: her situation (see above).  Pt has outpatient [place] [facility] (reports feeling stable on current medication regimen- had an inpatient hospitalization related to depression "7 or 8 years ago" but "doing well" since). Pt has not worked in 71 years- has no income and no insurance. Reports she is told she does not qualify for Medicaid or disability at this time due to not having a 12 month disability (last applied 2 months ago). Is supported financially by her parents who receive social security/retirement, are on  fixed income and cannot contribute to her care needs financially. Discussed pt's care needs and situation with director- pursuing SNF bed with 30 day LOG. Pt understanding that SNF bed may be out of area- she is agreeable. Requested Passr- currently in manual review. Completed FL2 and made referrals.   Plan: Attempting to locate SNF bed able to accept pt with no insurance coverage (30 day LOG). Will follow up for bed offers and with Passr.    Employment status:  Unemployed Forensic scientist:  Self Pay (Medicaid Pending) PT Recommendations:  [place] / Referral to community resources:  [place]  Patient/Family's Response to care:  appreciative  Patient/Family's Understanding of and Emotional Response to Diagnosis, Current Treatment, and Prognosis:  Pt demonstrates adequate understanding of her prognosis, plan, and barriers. Seems depressed and confirms her mood is sad. States, "I'll do whatever I have to do- it's the best thing for me."  Emotional Assessment Appearance:  Appears stated age Attitude/Demeanor/Rapport:   (engaged but drowsy) Affect (typically observed):  Flat, Calm Orientation:  Oriented to Self, Oriented to Place, Oriented to  Time, Oriented to Situation Alcohol / Substance use:  Not Applicable Psych involvement (Current and /or in the community):  No (Comment)  Discharge Needs  Concerns to be addressed:  Discharge Planning Concerns Readmission within the last 30 days:  No Current discharge risk:  Dependent with Mobility, Lack of support system Barriers  to Discharge:  Inadequate or no insurance, pending Passr   Nila Nephew, LCSW 04/19/2017, 9:51 AM  [phone]

## 2017-04-19 NOTE — [place] FL2 (Signed)
[place] MEDICAID FL2 LEVEL OF CARE SCREENING TOOL     IDENTIFICATION  Patient Name: Catherine Butler Birthdate: [birth year] Sex: female Admission Date (Current Location): 04/11/2017  [facility] and [place] Number:  Producer, television/film/video and Address:  [facility],  [address], [place] [address]      Provider Number: [id number]  Attending Physician Name and Address:  Leroy Sea, MD  Relative Name and Phone Number:       Current Level of Care: Hospital Recommended Level of Care: Skilled Nursing Facility Prior Approval Number:    Date Approved/Denied:   PASRR Number:    Discharge Plan: SNF    Current Diagnoses: Patient Active Problem List   Diagnosis Date Noted  . Essential hypertension 04/14/2017  . Paresthesia   . Lower extremity weakness 04/11/2017  . Abdominal pain 01/01/2017  . Sepsis (HCC) 01/01/2017  . Tobacco abuse 01/01/2017  . Abnormal LFTs 01/01/2017  . Depression   . COPD (chronic obstructive pulmonary disease) (HCC) 06/16/2015    Orientation RESPIRATION BLADDER Height & Weight     Self, Time, Situation, Place  Normal Incontinent, External catheter Weight: 181 lb (82.1 kg) Height:  5\' 6"  (167.6 cm)  BEHAVIORAL SYMPTOMS/MOOD NEUROLOGICAL BOWEL NUTRITION STATUS      Continent Diet (regular diet )  AMBULATORY STATUS COMMUNICATION OF NEEDS Skin   Extensive Assist Verbally Normal                       Personal Care Assistance Level of Assistance  Bathing, Feeding, Dressing Bathing Assistance: Limited assistance Feeding assistance: Independent Dressing Assistance: Independent     Functional Limitations Info  Sight, Hearing, Speech Sight Info: Adequate Hearing Info: Adequate Speech Info: Adequate    SPECIAL CARE FACTORS FREQUENCY  PT (By licensed PT), OT (By licensed OT)     PT Frequency: 5x OT Frequency: 5x            Contractures Contractures Info: Not present    Additional Factors Info  Code Status,  Allergies Code Status Info: full code Allergies Info: lipitor           Current Medications (04/19/2017):  This is the current hospital active medication list Current Facility-Administered Medications  Medication Dose Route Frequency Provider Last Rate Last Dose  . acetaminophen (TYLENOL) tablet 650 mg  650 mg Oral Q6H PRN Carron Curie, MD   650 mg at 04/18/17 1337  . albuterol (PROVENTIL) (2.5 MG/3ML) 0.083% nebulizer solution 3 mL  3 mL Inhalation Q6H PRN Carron Curie, MD   3 mL at 04/19/17 0745  . atorvastatin (LIPITOR) tablet 40 mg  40 mg Oral Daily Richarda Overlie, MD   40 mg at 04/18/17 1100  . bisacodyl (DULCOLAX) EC tablet 10 mg  10 mg Oral Daily Leroy Sea, MD   10 mg at 04/18/17 1300  . docusate sodium (COLACE) capsule 200 mg  200 mg Oral BID Leroy Sea, MD   200 mg at 04/18/17 2105  . feeding supplement (ENSURE ENLIVE) (ENSURE ENLIVE) liquid 237 mL  237 mL Oral BID BM Carron Curie, MD   237 mL at 04/18/17 1725  . fenofibrate tablet 160 mg  160 mg Oral Daily Leroy Sea, MD   160 mg at 04/18/17 1100  . gabapentin (NEURONTIN) capsule 600 mg  600 mg Oral QID Rejeana Brock, MD   600 mg at 04/18/17 2114  . heparin injection 5,000 Units  5,000 Units Subcutaneous Q8H Kirkpatrick,  McNeill P, MD   5,000 Units at 04/19/17 [id number]  . metoprolol tartrate (LOPRESSOR) tablet 50 mg  50 mg OrHardin Negusal BID Leroy SeaSingh, Prashant K, MD   50 mg at 04/18/17 2106  . mirtazapine (REMERON) tablet 15 mg  15 mg Oral QHS Carron CurieHijazi, Ali, MD   15 mg at 04/18/17 2118  . multivitamin with minerals tablet 1 tablet  1 tablet Oral Daily Alwyn RenMathews, Elizabeth G, MD   1 tablet at 04/18/17 1100  . nicotine (NICODERM CQ - dosed in mg/24 hours) patch 21 mg  21 mg Transdermal Daily Bodenheimer, Charles A, NP   21 mg at 04/18/17 1100  . ondansetron (ZOFRAN) injection 4 mg  4 mg Intravenous Q6H PRN Carron CurieHijazi, Ali, MD   4 mg at 04/19/17 0843  . oxyCODONE (Oxy IR/ROXICODONE) immediate release tablet 10 mg  10 mg Oral Q6H PRN  Leroy SeaSingh, Prashant K, MD   10 mg at 04/18/17 2105  . pantoprazole (PROTONIX) EC tablet 40 mg  40 mg Oral Daily Carron CurieHijazi, Ali, MD   40 mg at 04/18/17 1100  . QUEtiapine (SEROQUEL) tablet 400 mg  400 mg Oral QHS Carron CurieHijazi, Ali, MD   400 mg at 04/18/17 2106     Discharge Medications: Please see discharge summary for a list of discharge medications.  Relevant Imaging Results:  Relevant Lab Results:   Additional Information SS# [id number]  Nelwyn SalisburyMeghan R Ariyel Jeangilles, LCSW

## 2017-04-19 NOTE — Progress Notes (Signed)
Occupational Therapy Treatment Patient Details Name: Catherine Butler MRN: [id number] DOB: 04/06/1961 Today's Date: 04/19/2017    History of present illness 56 y.o. female, with past medical history significant for COPD, hypertension, anxiety and depression admitted with 2 days history of urinary retention and stool incontinence. Patient complains of back pain with numbness in her lower extremities in addition to weakness and had multiple falls lately. Imaging of spine unremarkable.  Pt reporting increasing weakness, decreased motor coordination and sensory changes all 4s since admission..  Neurology recommending IVIG for presummed Catherine Butler   OT comments  Worked on adl from chair today.  Used washmitt for face and able to hold regular washcloth for rest of body, only dropping one once. Built up foam was helpful for toothbrush  Follow Up Recommendations  SNF    Equipment Recommendations  3 in 1 bedside commode;Hospital bed (mechanical lift)    Recommendations for Other Services      Precautions / Restrictions Precautions Precautions: Fall Precaution Comments: history of falls  B LE parethesis/weakness. BUE abnormal sensation and decreased motor control especially on L Restrictions Weight Bearing Restrictions: No       Mobility Bed Mobility              Transfers                Balance       Sitting balance - Comments: pt had difficulty keeping RLE on leg rest--leg fell over side. Placed chair next to recliner with some success.                                   ADL either performed or assessed with clinical judgement   ADL       Grooming: Oral care;Minimal assistance;Sitting   Upper Body Bathing: Minimal assistance;Sitting   Lower Body Bathing: Maximal assistance;Sitting/lateral leans   Upper Body Dressing : Minimal assistance;Moderate assistance;Sitting                     General ADL Comments: performed ADL in chair.  Pt  was able to hold wash cloth; also gave her a washmitt, but she does need help to rinse and wring this out.  Brushed teeth with full set up and used built up foam, which helped her hold onto it.  Changed gown x 2 as pt got toothpaste on gown.  Needed assistance to manage cup to rinse with.  Spoke to NT about cleaning peri area again once in bed.  Pt states she is having difficulty with controlling lidded mug. Will bring weighted mug for her to try. Also issued more built up foam     Vision       Perception     Praxis      Cognition Arousal/Alertness: Awake/alert Behavior During Therapy: WFL for tasks assessed/performed Overall Cognitive Status: Within Functional Limits for tasks assessed                                 General Comments: "tired" poor sleep last nigh         Exercises     Shoulder Instructions       General Comments      Pertinent Vitals/ Pain       Pain Assessment: Faces Faces Pain Scale: Hurts a little bit Pain Location: B LE and  B UE "feels like needles" Pain Descriptors / Indicators: Pins and needles Pain Intervention(s): Limited activity within patient's tolerance;Monitored during session;Repositioned  Home Living                                          Prior Functioning/Environment              Frequency           Progress Toward Goals  OT Goals(current goals can now be found in the care plan section)  Progress towards OT goals: Progressing toward goals (slowly)     Plan      Co-evaluation                 AM-PAC PT "6 Clicks" Daily Activity     Outcome Measure   Help from another person eating meals?: A Little Help from another person taking care of personal grooming?: A Little Help from another person toileting, which includes using toliet, bedpan, or urinal?: A Lot Help from another person bathing (including washing, rinsing, drying)?: A Lot Help from another person to put on and taking  off regular upper body clothing?: A Lot Help from another person to put on and taking off regular lower body clothing?: Total 6 Click Score: 13    End of Session    OT Visit Diagnosis: Muscle weakness (generalized) (M62.81);Other abnormalities of gait and mobility (R26.89);Pain Pain - Right/Left:  (bil) Pain - part of body: Arm   Activity Tolerance Patient tolerated treatment well   Patient Left in bed;with call bell/phone within reach;with nursing/sitter in room   Nurse Communication          Time: 5366-4403 OT Time Calculation (min): 33 min  Charges: OT General Charges $OT Visit: 1 Visit OT Treatments $Self Care/Home Management : 23-37 mins  Marica Otter, OTR/L [phone] 04/19/2017 Catherine Butler 04/19/2017, 2:50 PM

## 2017-04-19 NOTE — Progress Notes (Signed)
Physical Therapy Treatment Patient Details Name: Catherine Butler MRN: [id number] DOB: 1960/08/18 Today's Date: 04/19/2017    History of Present Illness 56 y.o. female, with past medical history significant for COPD, hypertension, anxiety and depression admitted with 2 days history of urinary retention and stool incontinence. Patient complains of back pain with numbness in her lower extremities in addition to weakness and had multiple falls lately. Imaging of spine unremarkable.  Pt reporting increasing weakness, decreased motor coordination and sensory changes all 4s since admission..  Neurology recommending IVIG for presummed [name]    PT Comments    Pt progressing slowly.  Still requires + 2 assist and still present with poor motor control.  Follow Up Recommendations  SNF (per chart review, pt now plans to D/C to SNF.)     Equipment Recommendations  None recommended by PT    Recommendations for Other Services       Precautions / Restrictions Precautions Precautions: Fall Precaution Comments: history of falls  B LE parethesis/weakness. BUE abnormal sensation and decreased motor control especially on L Restrictions Weight Bearing Restrictions: No    Mobility  Bed Mobility Overal bed mobility: Needs Assistance Bed Mobility: Supine to Sit     Supine to sit: Supervision;Min guard     General bed mobility comments: difficulty functionally useing B UE's to control movement and grasp bed rails but with increased time was able to perform at Supervision/MinGuard   Transfers Overall transfer level: Needs assistance Equipment used: 2 person hand held assist;Rolling walker (2 wheeled) Transfers: Sit to/from Stand Sit to Stand: Max assist;+2 physical assistance;+2 safety/equipment Stand pivot transfers: Max assist;Total assist;+2 physical assistance;+2 safety/equipment       General transfer comment: great difficulty self rising and required + 2 side by side assist while  blocking B knees to prevent forward scooting to far and slipping off bed.  Once upright, pt had great difficulty maintaining a still staic standing posture.  Pt present with Mod ataxia and involuntary trunk stability.  Required + 2 assist to prevent LOB all planes.  Pt with poor ability to self coorect.    Ambulation/Gait Ambulation/Gait assistance: Max assist;Total assist;+2 physical assistance;+2 safety/equipment Ambulation Distance (Feet): 10 Feet Assistive device: Rolling walker (2 wheeled) Gait Pattern/deviations: Step-to pattern;Ataxic;Shuffle Gait velocity: decreased    General Gait Details: very limiteed amb distance due to severe ataxia with poor motor control.  LOB all planes as well as poor trunk control.  Pt still reports "needles" pain and MAX c/o fatigue.  HIGH FALL RISK.  #rd assist following with recliner.  Pt instructed to "slow down" with her steppage to attempt to increase control.     Stairs            Wheelchair Mobility    Modified Rankin (Stroke Patients Only)       Balance                                            Cognition Arousal/Alertness: Awake/alert Behavior During Therapy: WFL for tasks assessed/performed Overall Cognitive Status: Within Functional Limits for tasks assessed                                 General Comments: "tired" poor sleep last nigh       Exercises      General  Comments        Pertinent Vitals/Pain Pain Assessment: Faces Faces Pain Scale: Hurts a little bit Pain Location: B LE and B UE "feels like needles" Pain Descriptors / Indicators: Pins and needles Pain Intervention(s): Monitored during session    Home Living                      Prior Function            PT Goals (current goals can now be found in the care plan section) Progress towards PT goals: Progressing toward goals    Frequency    Min 3X/week      PT Plan Current plan remains appropriate     Co-evaluation              AM-PAC PT "6 Clicks" Daily Activity  Outcome Measure  Difficulty turning over in bed (including adjusting bedclothes, sheets and blankets)?: Unable Difficulty moving from lying on back to sitting on the side of the bed? : Unable Difficulty sitting down on and standing up from a chair with arms (e.g., wheelchair, bedside commode, etc,.)?: Unable Help needed moving to and from a bed to chair (including a wheelchair)?: Total Help needed walking in hospital room?: Total Help needed climbing 3-5 steps with a railing? : Total 6 Click Score: 6    End of Session Equipment Utilized During Treatment: Gait belt Activity Tolerance: Patient limited by fatigue;Patient limited by pain;Other (comment) Patient left: in chair;with chair alarm set Nurse Communication: Mobility status PT Visit Diagnosis: Unsteadiness on feet (R26.81);Repeated falls (R29.6);Muscle weakness (generalized) (M62.81);Difficulty in walking, not elsewhere classified (R26.2)     Time: 4098-11911043-1120 PT Time Calculation (min) (ACUTE ONLY): 37 min  Charges:  $Gait Training: 8-22 mins $Therapeutic Activity: 8-22 mins                    G Codes:       Felecia ShellingLori Glenora Morocho  PTA [facility]  Acute  Rehab Pager      [phone]

## 2017-04-19 NOTE — Progress Notes (Signed)
NIF -30 with good patient effort 

## 2017-04-19 NOTE — Progress Notes (Signed)
NIF -40 

## 2017-04-20 MED ORDER — METOPROLOL TARTRATE 50 MG PO TABS: 50.0000 mg | ORAL_TABLET | Freq: Two times a day (BID) | ORAL | 2 refills | Status: DC

## 2017-04-20 MED ORDER — OXYCODONE HCL 10 MG PO TABS: 10.0000 mg | ORAL_TABLET | Freq: Four times a day (QID) | ORAL | 0 refills | Status: DC | PRN

## 2017-04-20 MED ORDER — FENOFIBRATE 160 MG PO TABS: 160.0000 mg | ORAL_TABLET | Freq: Every day | ORAL | 2 refills | Status: DC

## 2017-04-20 NOTE — Clinical Social Work Placement (Signed)
Pt discharging today and will admit to [facility] report [phone].  Passr #[phone] E All information provided to facility- admission staff confirm receipt. Pt being placed with 30 day LOG approved by leadership.   Pt advised CSW she has informed her mother and son of her placement and son will be available to transport her back home at end of rehab.  See below for placement details.   CLINICAL SOCIAL WORK PLACEMENT  NOTE  Date:  04/20/2017  Patient Details  Name: Catherine Butler MRN: [id number] Date of Birth: 08/22/1960  Clinical Social Work is seeking post-discharge placement for this patient at the Skilled  Nursing Facility level of care (*CSW will initial, date and re-position this form in  chart as items are completed):  Yes   Patient/family provided with [facility] Clinical Social Work Department's list of facilities offering this level of care within the geographic area requested by the patient (or if unable, by the patient's family).  Yes   Patient/family informed of their freedom to choose among providers that offer the needed level of care, that participate in Medicare, Medicaid or managed care program needed by the patient, have an available bed and are willing to accept the patient.  Yes   Patient/family informed of [facility]'s ownership interest in [facility] and [facility], as well as of the fact that they are under no obligation to receive care at these facilities.  PASRR submitted to EDS on 04/18/17     PASRR number received on 04/19/17     Existing PASRR number confirmed on       FL2 transmitted to all facilities in geographic area requested by pt/family on 04/19/17     FL2 transmitted to all facilities within larger geographic area on       Patient informed that his/her managed care company has contracts with or will negotiate with certain facilities, including the following:        Yes   Patient/family informed of bed  offers received.  Patient chooses bed at [facility]     Physician recommends and patient chooses bed at [facility]    Patient to be transferred to [facility] on 04/20/17.  Patient to be transferred to facility by PTAR     Patient family notified on 04/20/17 of transfer.  Name of family member notified:  pt notified mother and son     PHYSICIAN       Additional Comment:    _______________________________________________ Nelwyn SalisburyMeghan R Doryan Bahl, LCSW 04/20/2017, 12:37 PM  [phone]

## 2017-04-20 NOTE — Progress Notes (Signed)
Occupational Therapy Treatment Patient Details Name: Catherine Butler MRN: [id number] DOB: 09-10-1960 Today's Date: 04/20/2017    History of present illness 56 y.o. female, with past medical history significant for COPD, hypertension, anxiety and depression admitted with 2 days history of urinary retention and stool incontinence. Patient complains of back pain with numbness in her lower extremities in addition to weakness and had multiple falls lately. Imaging of spine unremarkable.  Pt reporting increasing weakness, decreased motor coordination and sensory changes all 4s since admission..  Neurology recommending IVIG for presummed Catherine Butler   OT comments  Pt progressing slowly. Remains very motivated  Follow Up Recommendations  SNF    Equipment Recommendations  3 in 1 bedside commode;Hospital bed (and mechanical lift)    Recommendations for Other Services      Precautions / Restrictions Precautions Precautions: Fall Precaution Comments: history of falls  B LE parethesis/weakness. BUE abnormal sensation and decreased motor control especially on L Restrictions Weight Bearing Restrictions: No       Mobility Bed Mobility           Sit to supine: Min assist   General bed mobility comments: light assistance for supine to EOB.  Used rails.  S for rolling to change chux pad and for hygiene  Transfers                      Balance   Sitting-balance support:  (variable arm support) Sitting balance-Leahy Scale: Fair Sitting balance - Comments: worked on weight shifting and leaning to bil sides for hygiene; worked on trunk extension from posterior pelvic tilt.pt sat eob x 15 minutes                                   ADL either performed or assessed with clinical judgement   ADL   Eating/Feeding: Set up;Bed level               Upper Body Dressing : Minimal assistance;Sitting (EOB)                     General ADL Comments: pt reports  she was able to set herself up for brushing her teeth today     Vision       Perception     Praxis      Cognition Arousal/Alertness: Awake/alert Behavior During Therapy: WFL for tasks assessed/performed Overall Cognitive Status: Within Functional Limits for tasks assessed                                          Exercises Exercises: Other exercises Other Exercises Other Exercises: worked on AROM for bil UEs; R stronger with better control; L recruited biceps. Both to 90; x 10 reps. Worked on scapula retraction in sitting.  Pt had difficulty with L and tended to elevate Other Exercises: educated on desensitization:  used washcloth today   Shoulder Instructions       General Comments      Pertinent Vitals/ Pain       Pain Assessment: Faces Faces Pain Scale: Hurts a little bit Pain Location: B LE and B UE "feels like needles" Pain Descriptors / Indicators: Pins and needles Pain Intervention(s): Limited activity within patient's tolerance;Monitored during session;Repositioned  Home Living  Prior Functioning/Environment              Frequency  Min 2X/week        Progress Toward Goals  OT Goals(current goals can now be found in the care plan section)  Progress towards OT goals: Progressing toward goals     Plan      Co-evaluation                 AM-PAC PT "6 Clicks" Daily Activity     Outcome Measure   Help from another person eating meals?: A Little Help from another person taking care of personal grooming?: A Little Help from another person toileting, which includes using toliet, bedpan, or urinal?: A Lot Help from another person bathing (including washing, rinsing, drying)?: A Lot Help from another person to put on and taking off regular upper body clothing?: A Little Help from another person to put on and taking off regular lower body clothing?: Total 6 Click Score:  14    End of Session    OT Visit Diagnosis: Muscle weakness (generalized) (M62.81);Other abnormalities of gait and mobility (R26.89);Pain   Activity Tolerance Patient tolerated treatment well   Patient Left in bed;with call bell/phone within reach;with nursing/sitter in room   Nurse Communication          Time: [id number] OT Time Calculation (min): 24 min  Charges: OT General Charges $OT Visit: 1 Visit OT Treatments $Self Care/Home Management : 8-22 mins $Therapeutic Activity: 8-22 mins  Catherine Butler, OTR/L [phone] 04/20/2017   Catherine Butler 04/20/2017, 12:13 PM

## 2017-04-20 NOTE — Progress Notes (Signed)
NIF-35 pt had good effort.

## 2017-04-20 NOTE — Progress Notes (Signed)
PTAR transferring patient to rehab. Tried to call 600 floor at number [phone] twice and left one voicemail to give hand-off report. No return phone call has been received. Patient has left unit and headed to rehab

## 2017-04-20 NOTE — Discharge Summary (Signed)
Physician Discharge Summary  DOT SPLINTER ZOX:[id number] DOB: 12-Apr-1961 DOA: 04/11/2017  PCP: Loletta Specter, PA-C  Admit date: 04/11/2017 Discharge date: 04/20/2017  Time spent: 35 minutes  Recommendations for Outpatient Follow-up:  1. Follow-up PCP in 2 weeks follow-up 2. Neurology in 2 weeks for EMG as outpatient 3. Patient will be discharged with Foley catheter, voiding trial at skilled facility in the next 3 days, if continues to have urinary retention reinsert Foley catheter and will need outpatient urology evaluation     Discharge Diagnoses:  Active Problems:   COPD (chronic obstructive pulmonary disease) (HCC)   Lower extremity weakness   Paresthesia   Essential hypertension   Discharge Condition: Stable  Diet recommendation: Regular diet  Filed Weights   04/11/17 1002 04/11/17 2026 04/19/17 1205  Weight: 79.4 kg (175 lb) 82.1 kg (181 lb) 77 kg (169 lb 12.1 oz)    History of present illness:   56 y.o.female,with past medical history significant for COPD, hypertension, anxiety and depression presenting today with 2 days history of urinary retention and stool incontinence. Patient complains of back pain with numbness in her lower extremities in addition to weakness and had multiple falls lately. Patient denies any burning urination, blood in the urine. No fever or chills. No history of flu or flu shot,lately. Patient reports history of upper extremity numbness in addition to lower extremity numbness for the last 1 year. In the emergency room she was also noted to have urinary retention with 1 L of urine catheterized.  MRI of the cervical spine showed no acute abnormality, mild degenerative spondylosis C3-C4-C5 and 6 without significant stenosis.  MRI of the lumbar spine no abnormal enhancement or acute abnormality.  MRI of the thoracic spine shows small disc protrusions at T7-T8 and 9 and old T4 compression fracture without bony retropulsion.  Neurology Dr.  Melinda Crutch was consulted who recommended IR to perform LP to asses protein,cell count,differential and glucose.  Patient has a Foley catheter in place this was placed due to bladder distention.  Patient also has new onset urinary and bowel incontinence.  Plan DW Dr Amada Jupiter on 04-17-17, called [facility] and DW Dr Alphonzo Dublin Neuro on 04-17-17 @ 12.18pm who declined transfer stating management now is to observe only.  Plan again discussed with neurologist Dr. Wilford Corner on 04/18/2017, continue observation, SNF, outpatient follow-up with [facility] neurologist Dr. Allena Katz for EMG.  Hospital Course:   Paresthesias/weakness  -patient presented with motor weakness of both upper and lower extremities , also developed   neuropathic pain as well, did develop bowel and bladder incontinence.  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   NIF between -35 and- 40 and staying stable.    Initially neurology had recommended transfer to [facility] neurologist had detailed discussions with Dr Thedore Mins and Dr. Amada Jupiter, after which [facility] neurologist Dr. Alphonzo Dublin declined transfer as according to him management at this point is just observation.  Dr. Amada Jupiter was satisfied with keeping patient here continuing PT and supportive care.    Dr Thedore Mins discussed Plan  with neurologist Dr. Wilford Corner on 04/18/2017, continue observation, SNF, outpatient follow-up with [facility] neurologist Dr. Allena Katz for EMG.   Urinary retention.  Due to #1 above.  Foley in place likely will go to SNF with Foley and outpatient urology follow-up.   COPD-appears to be stable, continue as needed Ventolin  Hypertension-has been started  on Lopressor with good control.  Gastroesophageal reflux disease - on PPI  Asthma- stable  Dyslipidemia -on Lipitor, triglycerides extremely high,  added TriCor as  well.  Anxiety/depression - on Seroquel which will be continued    Procedures:  None   Consultations:  Neurology  Discharge Exam: Vitals:   04/19/17 1957 04/20/17 0401  BP: 110/83 115/79  Pulse: 92 87  Resp: 18 20  Temp: 98.3 F (36.8 C) 97.9 F (36.6 C)  SpO2: 99% 98%    General: Appears in no acute distress Cardiovascular: S1-S2, regular Respiratory: Clear to auscultation bilaterally Neurological-motor strength 5/5 in both upper and lower extremities, sensations intact bilaterally.  No focal neurological deficit noted.  Discharge Instructions   Discharge Instructions    Diet - low sodium heart healthy    Complete by:  As directed    Increase activity slowly    Complete by:  As directed      Current Discharge Medication List    START taking these medications   Details  fenofibrate 160 MG tablet Take 1 tablet (160 mg total) by mouth daily. Qty: 30 tablet, Refills: 2    metoprolol tartrate (LOPRESSOR) 50 MG tablet Take 1 tablet (50 mg total) by mouth 2 (two) times daily. Qty: 60 tablet, Refills: 2    oxyCODONE 10 MG TABS Take 1 tablet (10 mg total) by mouth every 6 (six) hours as needed for severe pain. Qty: 30 tablet, Refills: 0      CONTINUE these medications which have NOT CHANGED   Details  acetaminophen (TYLENOL) 500 MG tablet Take 1,000 mg by mouth 2 (two) times daily as needed for headache.    albuterol (PROVENTIL HFA;VENTOLIN HFA) 108 (90 Base) MCG/ACT inhaler Inhale 2 puffs into the lungs every 6 (six) hours as needed for wheezing. Qty: 1 Inhaler, Refills: 0    gabapentin (NEURONTIN) 400 MG capsule Take 400 mg by mouth 4 (four) times daily.     mirtazapine (REMERON) 15 MG tablet Take 15 mg by mouth at bedtime.    omeprazole (PRILOSEC) 40 MG capsule Take 1 capsule (40 mg total) by mouth 2 (two) times daily. Qty: 60 capsule, Refills: 1    ondansetron (ZOFRAN-ODT) 8 MG disintegrating tablet Take 1 tablet (8 mg total) by mouth every 8 (eight)  hours as needed for nausea or vomiting. Qty: 30 tablet, Refills: 0    polyethylene glycol (MIRALAX) packet Take 17 g by mouth 2 (two) times daily. Qty: 14 each, Refills: 0    QUEtiapine (SEROQUEL) 400 MG tablet Take 400 mg by mouth at bedtime.    atorvastatin (LIPITOR) 40 MG tablet Take 1 tablet (40 mg total) by mouth daily. Qty: 90 tablet, Refills: 3    benzonatate (TESSALON PERLES) 100 MG capsule Take 2 capsules (200 mg total) by mouth 3 (three) times daily as needed for cough. Qty: 42 capsule, Refills: 0   Associated Diagnoses: Cough      STOP taking these medications     amoxicillin-clavulanate (AUGMENTIN) 875-125 MG tablet      ibuprofen (ADVIL,MOTRIN) 200 MG tablet      predniSONE (DELTASONE) 20 MG tablet        Allergies  Allergen Reactions  . Lipitor [Atorvastatin] Other (See Comments)    "really bad leg pain"   Follow-up Information    Loletta Specter, PA-C. Schedule an appointment as soon as possible for a visit.   Specialty:  Physician Assistant Why:  Please follow up with your PCP. Contact information: [address] [place] [address] [phone]        [facility]. Schedule an appointment as soon as possible for a visit.   Why:  Please make an appointment for FINANCIAL COUNSELING if you haven't already.  Please use this locaiton for your PHARMACY needs. Contact information: [address] [place] [phone]           The results of significant diagnostics from this hospitalization (including imaging, microbiology, ancillary and laboratory) are listed below for reference.    Significant Diagnostic Studies: Ct Chest Wo Contrast  Result Date: 04/18/2017 CLINICAL DATA:  Smoker, COPD. Paresthesias. Evaluate for paraneoplastic syndrome etiology EXAM: CT CHEST WITHOUT CONTRAST TECHNIQUE: Multidetector CT imaging of the chest was performed following the standard protocol  without IV contrast. COMPARISON:  Chest CT 12/16 FINDINGS: Cardiovascular: No significant vascular findings. Normal heart size. No pericardial effusion. Mediastinum/Nodes: No axillary supraclavicular adenopathy. No mediastinal adenopathy. Large hiatal hernia. Lungs/Pleura: Mild atelectasis at the LEFT lung base. No pulmonary mass. No suspicious nodules. Upper Abdomen: Limited view of the liver, kidneys, pancreas are unremarkable. Diffuse low-attenuation liver consistent with hepatic steatosis. This finding is strikingly. Adrenal glands are normal. Musculoskeletal:  Healed rib fractures posterior LEFT. IMPRESSION: 1. No suspicious pulmonary nodule or mass. 2. No mediastinal adenopathy. 3. Large hiatal hernia. 4. Marked hepatic steatosis. Electronically Signed   By: Genevive BiStewart  Edmunds M.D.   On: 04/18/2017 20:04   Mr Thoracic Spine Wo Contrast  Result Date: 04/11/2017 CLINICAL DATA:  Urinary retention and bladder pain. Numbness from the waist down. The patient suffered 2 falls last night. Initial encounter. EXAM: MRI THORACIC SPINE WITHOUT CONTRAST TECHNIQUE: Multiplanar, multisequence MR imaging of the thoracic spine was performed. No intravenous contrast was administered. COMPARISON:  CT chest 06/18/2015. FINDINGS: Alignment: There is mild exaggeration of the normal thoracic kyphosis. Vertebrae: Remote anterior, superior endplate compression fracture of T4 with vertebral body height loss of approximately 25% is unchanged. Prominent Schmorl's node in the superior endplate of T11 is also unchanged. There is no acute fracture or worrisome marrow lesion. Cord:  Normal signal throughout.  Epidural lipomatosis noted. Paraspinal and other soft tissues: Hiatal hernia is noted. Disc levels: T4-5: Minimal disc bulge. No bony retropulsion off the superior endplate of T4. The central canal and foramina are widely patent. T7-8: Very shallow left paracentral protrusion. The central canal and foramina are widely patent. T8-9:  Very shallow right paracentral protrusion with some cephalad extension. The disc indents the ventral thecal sac but the central canal and foramina are widely patent. Intervertebral disc spaces are otherwise unremarkable. IMPRESSION: No acute abnormality or finding to explain the patient's symptoms. Mild degenerative disc disease most notable at T7-8 and T8-9 where there are small disc protrusions. The central canal and foramina are widely patent at all levels. Epidural lipomatosis. Very mild, remote superior endplate compression fracture of T4 without bony retropulsion. Electronically Signed   By: Drusilla Kannerhomas  Dalessio M.D.   On: 04/11/2017 14:55   Mr Lumbar Spine Wo Contrast  Result Date: 04/11/2017 CLINICAL DATA:  Muscle weakness. EXAM: MRI LUMBAR SPINE WITHOUT CONTRAST TECHNIQUE: Multiplanar, multisequence MR imaging of the lumbar spine was performed. No intravenous contrast was administered. COMPARISON:  None. FINDINGS: Segmentation:  Standard. Alignment:  Physiologic. Vertebrae:  No fracture, evidence of discitis, or bone lesion. Conus medullaris: Extends to the L1 level and appears normal. Paraspinal and other soft tissues: Negative. Disc levels: T12-L1:  Normal. L1-2:  Disc desiccation.  Otherwise  normal. L2-3: Tiny disc bulge into the left neural foramen. Otherwise normal. L3-4: Normal disc. Minimal degenerative changes of the facet joints. L4-5:  Normal. L5-S1:  Normal. IMPRESSION: No significant abnormality of the lumbar spine. No spinal or foraminal stenosis. No significant facet joint disease. Distal spinal cord appears normal. Electronically Signed   By: Francene Boyers M.D.   On: 04/11/2017 14:09   Mr Lumbar Spine W Contrast  Result Date: 04/11/2017 CLINICAL DATA:  Initial evaluation for generalized muscle weakness. EXAM: MRI CERVICAL AND LUMBAR SPINE WITHOUT AND WITH CONTRAST TECHNIQUE: Multiplanar and multiecho pulse sequences of the cervical spine, to include the craniocervical junction and  cervicothoracic junction, and lumbar spine, were obtained without and with intravenous contrast. CONTRAST:  15mL MULTIHANCE GADOBENATE DIMEGLUMINE 529 MG/ML IV SOLN COMPARISON:  Prior noncontrast MRIs of the thoracic and lumbar spine performed earlier the same day. FINDINGS: MRI CERVICAL SPINE FINDINGS Alignment: Study degraded by motion artifact. Vertebral bodies normally aligned with preservation of the normal cervical lordosis. No listhesis or subluxation. Vertebrae: Height loss at the superior endplate of T4 again noted. Vertebral body heights otherwise maintained. No evidence for acute cervical spine fracture. Bone marrow signal intensity within normal limits. No discrete or worrisome osseous lesions. No abnormal enhancement. Cord: Signal intensity within the cervical spinal cord is normal. Cord caliber within normal limits. No abnormal enhancement. Posterior Fossa, vertebral arteries, paraspinal tissues: Paraspinal soft tissues demonstrate no acute abnormality. No abnormal prevertebral edema. Normal vascular flow voids noted within the vertebral arteries bilaterally. Disc levels: C2-C3: Unremarkable. C3-C4: Mild uncovertebral and facet hypertrophy. No canal stenosis. Mild bilateral C4 foraminal narrowing. C4-C5: Mild disc bulge. Right-sided uncovertebral hypertrophy. Resultant moderate right C5 foraminal stenosis. No canal narrowing. C5-C6: Mild disc bulge with uncovertebral hypertrophy. No canal stenosis. Mild right C6 foraminal narrowing. C6-C7:  Unremarkable. C7-T1:  Unremarkable. No made of a tiny right foraminal disc protrusion at T2-3 without stenosis. Remainder the visualized upper thoracic spine grossly unremarkable. MRI LUMBAR SPINE FINDINGS Segmentation: Limited post-contrast imaging of the lumbar spine was performed. Normal segmentation. Lowest well-formed disc labeled the L5-S1 level. Alignment: Normal alignment with preservation of the normal lumbar lordosis. Vertebrae: Vertebral body heights  maintained. No evidence for acute or chronic fracture. Bone marrow signal intensity within normal limits. No discrete osseous lesions. No abnormal enhancement. Conus medullaris: Extends to the L1 level and appears normal. No abnormal enhancement. Paraspinal and other soft tissues: Paraspinous soft tissues within normal limits. No abnormal enhancement. Visualized visceral structures within normal limits. Disc levels: Mild multilevel degenerative changes again noted, described on prior MRI from earlier same day. No significant stenosis or evidence for neural impingement. IMPRESSION: MRI CERVICAL SPINE IMPRESSION: 1. No acute abnormality within the cervical spine. No findings to explain patient's symptoms identified. 2. Mild degenerative spondylolysis at C3-4 through C5-6 without significant canal stenosis. Mild right greater than left foraminal narrowing at these levels as above. MRI LUMBAR SPINE IMPRESSION: No abnormal enhancement or other acute abnormality within the lumbar spine. Electronically Signed   By: Rise Mu M.D.   On: 04/11/2017 20:33   Mr Cervical Spine W Or Wo Contrast  Result Date: 04/11/2017 CLINICAL DATA:  Initial evaluation for generalized muscle weakness. EXAM: MRI CERVICAL AND LUMBAR SPINE WITHOUT AND WITH CONTRAST TECHNIQUE: Multiplanar and multiecho pulse sequences of the cervical spine, to include the craniocervical junction and cervicothoracic junction, and lumbar spine, were obtained without and with intravenous contrast. CONTRAST:  15mL MULTIHANCE GADOBENATE DIMEGLUMINE 529 MG/ML IV SOLN COMPARISON:  Prior noncontrast  MRIs of the thoracic and lumbar spine performed earlier the same day. FINDINGS: MRI CERVICAL SPINE FINDINGS Alignment: Study degraded by motion artifact. Vertebral bodies normally aligned with preservation of the normal cervical lordosis. No listhesis or subluxation. Vertebrae: Height loss at the superior endplate of T4 again noted. Vertebral body heights  otherwise maintained. No evidence for acute cervical spine fracture. Bone marrow signal intensity within normal limits. No discrete or worrisome osseous lesions. No abnormal enhancement. Cord: Signal intensity within the cervical spinal cord is normal. Cord caliber within normal limits. No abnormal enhancement. Posterior Fossa, vertebral arteries, paraspinal tissues: Paraspinal soft tissues demonstrate no acute abnormality. No abnormal prevertebral edema. Normal vascular flow voids noted within the vertebral arteries bilaterally. Disc levels: C2-C3: Unremarkable. C3-C4: Mild uncovertebral and facet hypertrophy. No canal stenosis. Mild bilateral C4 foraminal narrowing. C4-C5: Mild disc bulge. Right-sided uncovertebral hypertrophy. Resultant moderate right C5 foraminal stenosis. No canal narrowing. C5-C6: Mild disc bulge with uncovertebral hypertrophy. No canal stenosis. Mild right C6 foraminal narrowing. C6-C7:  Unremarkable. C7-T1:  Unremarkable. No made of a tiny right foraminal disc protrusion at T2-3 without stenosis. Remainder the visualized upper thoracic spine grossly unremarkable. MRI LUMBAR SPINE FINDINGS Segmentation: Limited post-contrast imaging of the lumbar spine was performed. Normal segmentation. Lowest well-formed disc labeled the L5-S1 level. Alignment: Normal alignment with preservation of the normal lumbar lordosis. Vertebrae: Vertebral body heights maintained. No evidence for acute or chronic fracture. Bone marrow signal intensity within normal limits. No discrete osseous lesions. No abnormal enhancement. Conus medullaris: Extends to the L1 level and appears normal. No abnormal enhancement. Paraspinal and other soft tissues: Paraspinous soft tissues within normal limits. No abnormal enhancement. Visualized visceral structures within normal limits. Disc levels: Mild multilevel degenerative changes again noted, described on prior MRI from earlier same day. No significant stenosis or evidence for  neural impingement. IMPRESSION: MRI CERVICAL SPINE IMPRESSION: 1. No acute abnormality within the cervical spine. No findings to explain patient's symptoms identified. 2. Mild degenerative spondylolysis at C3-4 through C5-6 without significant canal stenosis. Mild right greater than left foraminal narrowing at these levels as above. MRI LUMBAR SPINE IMPRESSION: No abnormal enhancement or other acute abnormality within the lumbar spine. Electronically Signed   By: Rise Mu M.D.   On: 04/11/2017 20:33   Ct Abdomen Pelvis W Contrast  Result Date: 03/26/2017 CLINICAL DATA:  RIGHT lower quadrant abdominal pain, constipation, last bowel movement 5 weeks ago, nausea, vomiting, hypertension, asthma, COPD, smoker EXAM: CT ABDOMEN AND PELVIS WITH CONTRAST TECHNIQUE: Multidetector CT imaging of the abdomen and pelvis was performed using the standard protocol following bolus administration of intravenous contrast. CONTRAST:  <See Chart> ISOVUE-300 IOPAMIDOL (ISOVUE-300) INJECTION 61% COMPARISON:  01/01/2017 FINDINGS: Lower chest: Subsegmental atelectasis LEFT lower lobe, improved. Minimal dependent atelectasis RIGHT lower lobe. Hepatobiliary: Diffuse fatty infiltration of liver. Gallbladder unremarkable. No discrete hepatic mass. 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 No obvious pancreatic mass or ductal dilatation. Spleen: Normal appearance Adrenals/Urinary Tract: Adrenal glands, kidneys, ureters, and bladder normal appearance Stomach/Bowel: Normal appendix. Increased stool throughout colon. Moderate-sized hiatal hernia. Stomach and bowel loops otherwise unremarkable. Vascular/Lymphatic: Atherosclerotic calcification aorta without aneurysm. No adenopathy. Reproductive: Atrophic uterus with unremarkable adnexa Other: No free air or free fluid. No hernia or acute inflammatory process. Musculoskeletal: Chronic superior endplate compression fracture of T11. No acute  osseous findings. IMPRESSION: Marked fatty infiltration of liver. Scattered parenchymal calcifications within pancreas question chronic calcific pancreatitis. Increased stool in colon consistent with history of constipation. Moderate-sized hiatal hernia. Aortic  Atherosclerosis (ICD10-I70.0). Electronically Signed   By: Ulyses Southward M.D.   On: 03/26/2017 19:30   Dg Fluoro Guide Lumbar Puncture  Result Date: 04/13/2017 CLINICAL DATA:  Inpatient. Unexplained paresthesias and lower extremity weakness. EXAM: DIAGNOSTIC LUMBAR PUNCTURE UNDER FLUOROSCOPIC GUIDANCE FLUOROSCOPY TIME:  Fluoroscopy Time:  0 minutes 20 seconds Radiation Exposure Index (if provided by the fluoroscopic device): 8.8 mGy Number of Acquired Spot Images: 0 PROCEDURE: Informed consent was obtained from the patient prior to the procedure, including potential complications of headache, allergy, and pain. With the patient prone, the lower back was prepped with Betadine. 1% Lidocaine was used for local anesthesia. Lumbar puncture was performed at the left L2-3 level via interlaminar approach using a 20 gauge needle with return of clear CSF with an opening pressure of 11 cm water. 8.5 ml of CSF were obtained for laboratory studies. The patient tolerated the procedure well and there were no apparent complications. IMPRESSION: Technically successful diagnostic lumbar puncture under fluoroscopic guidance. Electronically Signed   By: Delbert Phenix M.D.   On: 04/13/2017 12:30    Microbiology: Recent Results (from the past 240 hour(s))  Gram stain     Status: None   Collection Time: 04/13/17 11:50 AM  Result Value Ref Range Status   Specimen Description CSF  Final   Special Requests NONE  Final   Gram Stain   Final    WBC PRESENT,BOTH PMN AND MONONUCLEAR NO ORGANISMS SEEN CYTOSPIN Gram Stain Report Called to,Read Back By and Verified With: BODKIN,K. RN @1403  10.24.18 BY [email or web address]    Report Status 04/13/2017 FINAL  Final     Labs: Basic  Metabolic Panel:  Recent Labs Lab 04/13/17 1340 04/14/17 0422 04/16/17 0356 04/17/17 0416 04/18/17 0425 04/19/17 0415  NA  --  137 136 132* 133* 132*  K  --  4.3 5.1 3.8 3.2* 3.8  CL  --  105 106 97* 99* 95*  CO2  --  21* 22 24 26 26   GLUCOSE  --  105* 98 126* 106* 112*  BUN  --  6 5* 7 8 11   CREATININE  --  0.65 0.65 0.62 0.61 0.73  CALCIUM  --  8.2* 8.2* 8.3* 8.2* 8.5*  MG 1.5*  --   --  1.6* 1.7 1.6*   Liver Function Tests:  Recent Labs Lab 04/14/17 0422 04/16/17 0356  AST 77* 99*  ALT 39 39  ALKPHOS 211* 223*  BILITOT 1.3* 1.3*  PROT 6.3* 7.1  ALBUMIN 2.2* 1.9*   No results for input(s): LIPASE, AMYLASE in the last 168 hours. No results for input(s): AMMONIA in the last 168 hours. CBC:  Recent Labs Lab 04/14/17 0422 04/16/17 0356 04/17/17 0416 04/18/17 0425  WBC 7.1 5.7 7.4 4.4  HGB 10.0* 9.8* 10.9* 10.7*  HCT 29.0* 29.8* 32.5* 32.7*  MCV 108.6* 112.0* 109.8* 110.1*  PLT 172 191 140* 152   Cardiac Enzymes:  Recent Labs Lab 04/13/17 1340  CKTOTAL 21*   BNP: BNP (last 3 results) No results for input(s): BNP in the last 8760 hours.  ProBNP (last 3 results) No results for input(s): PROBNP in the last 8760 hours.  CBG: No results for input(s): GLUCAP in the last 168 hours.     SignedMeredeth Ide MD.  [facility] 04/20/2017, 11:45 AM

## 2017-05-19 ENCOUNTER — Encounter (HOSPITAL_COMMUNITY): Payer: Self-pay

## 2017-05-19 ENCOUNTER — Encounter: Payer: Self-pay | Admitting: Vascular Surgery

## 2017-06-20 ENCOUNTER — Emergency Department (HOSPITAL_COMMUNITY): Payer: Medicaid Other

## 2017-06-20 ENCOUNTER — Inpatient Hospital Stay (HOSPITAL_COMMUNITY)
Admission: EM | Admit: 2017-06-20 | Discharge: 2017-06-23 | DRG: 641 | Disposition: A | Discharge status: Rehabilitation Facility | Payer: Medicaid Other | Attending: Family Medicine | Admitting: Family Medicine

## 2017-06-20 ENCOUNTER — Encounter (HOSPITAL_COMMUNITY): Payer: Self-pay | Admitting: *Deleted

## 2017-06-20 DIAGNOSIS — R112 Nausea with vomiting, unspecified: Secondary | ICD-10-CM | POA: Diagnosis present

## 2017-06-20 DIAGNOSIS — Z8669 Personal history of other diseases of the nervous system and sense organs: Secondary | ICD-10-CM

## 2017-06-20 DIAGNOSIS — I10 Essential (primary) hypertension: Secondary | ICD-10-CM | POA: Diagnosis present

## 2017-06-20 DIAGNOSIS — E860 Dehydration: Secondary | ICD-10-CM | POA: Diagnosis not present

## 2017-06-20 DIAGNOSIS — B349 Viral infection, unspecified: Secondary | ICD-10-CM | POA: Diagnosis present

## 2017-06-20 DIAGNOSIS — Z888 Allergy status to other drugs, medicaments and biological substances status: Secondary | ICD-10-CM

## 2017-06-20 DIAGNOSIS — R29898 Other symptoms and signs involving the musculoskeletal system: Secondary | ICD-10-CM | POA: Diagnosis present

## 2017-06-20 DIAGNOSIS — F329 Major depressive disorder, single episode, unspecified: Secondary | ICD-10-CM | POA: Diagnosis present

## 2017-06-20 DIAGNOSIS — E869 Volume depletion, unspecified: Secondary | ICD-10-CM | POA: Diagnosis not present

## 2017-06-20 DIAGNOSIS — J449 Chronic obstructive pulmonary disease, unspecified: Secondary | ICD-10-CM | POA: Diagnosis present

## 2017-06-20 DIAGNOSIS — R000 Tachycardia, unspecified: Secondary | ICD-10-CM | POA: Diagnosis present

## 2017-06-20 DIAGNOSIS — F32A Depression, unspecified: Secondary | ICD-10-CM | POA: Diagnosis present

## 2017-06-20 DIAGNOSIS — Z79899 Other long term (current) drug therapy: Secondary | ICD-10-CM

## 2017-06-20 DIAGNOSIS — Z915 Personal history of self-harm: Secondary | ICD-10-CM

## 2017-06-20 DIAGNOSIS — K219 Gastro-esophageal reflux disease without esophagitis: Secondary | ICD-10-CM | POA: Diagnosis present

## 2017-06-20 DIAGNOSIS — F17210 Nicotine dependence, cigarettes, uncomplicated: Secondary | ICD-10-CM | POA: Diagnosis present

## 2017-06-20 DIAGNOSIS — E876 Hypokalemia: Secondary | ICD-10-CM | POA: Diagnosis present

## 2017-06-20 DIAGNOSIS — G8929 Other chronic pain: Secondary | ICD-10-CM | POA: Diagnosis present

## 2017-06-20 HISTORY — DX: Alcohol abuse, in remission: F10.11

## 2017-06-20 HISTORY — DX: Poisoning by multiple unspecified drugs, medicaments and biological substances, intentional self-harm, initial encounter: T50.912A

## 2017-06-20 LAB — COMPREHENSIVE METABOLIC PANEL
ALK PHOS: 143 U/L — AB (ref 38–126)
ALT: 16 U/L (ref 14–54)
ANION GAP: 14 (ref 5–15)
AST: 28 U/L (ref 15–41)
Albumin: 2.8 g/dL — ABNORMAL LOW (ref 3.5–5.0)
BILIRUBIN TOTAL: 1.8 mg/dL — AB (ref 0.3–1.2)
BUN: 9 mg/dL (ref 6–20)
CALCIUM: 8.6 mg/dL — AB (ref 8.9–10.3)
CO2: 21 mmol/L — AB (ref 22–32)
CREATININE: 0.49 mg/dL (ref 0.44–1.00)
Chloride: 94 mmol/L — ABNORMAL LOW (ref 101–111)
GFR calc non Af Amer: >60 mL/min (ref >60)
GLUCOSE: 110 mg/dL — AB (ref 65–99)
Potassium: 2.7 mmol/L — CL (ref 3.5–5.1)
Sodium: 129 mmol/L — ABNORMAL LOW (ref 135–145)
TOTAL PROTEIN: 7.1 g/dL (ref 6.5–8.1)

## 2017-06-20 LAB — BLOOD GAS, VENOUS
Acid-base deficit: 1.6 mmol/L (ref 0.0–2.0)
Bicarbonate: 21.1 mmol/L (ref 20.0–28.0)
Drawn by: 257881
O2 Saturation: 71.7 %
PATIENT TEMPERATURE: 98.6
PH VEN: 7.442 — AB (ref 7.250–7.430)
pCO2, Ven: 31.4 mmHg — ABNORMAL LOW (ref 44.0–60.0)
pO2, Ven: 39.9 mmHg (ref 32.0–45.0)

## 2017-06-20 LAB — I-STAT TROPONIN, ED: Troponin i, poc: 0 ng/mL (ref 0.00–0.08)

## 2017-06-20 LAB — URINALYSIS, ROUTINE W REFLEX MICROSCOPIC
BACTERIA UA: NONE SEEN
Glucose, UA: NEGATIVE mg/dL
HGB URINE DIPSTICK: NEGATIVE
KETONES UR: 80 mg/dL — AB
LEUKOCYTES UA: NEGATIVE
Nitrite: NEGATIVE
PROTEIN: 30 mg/dL — AB
Specific Gravity, Urine: 1.02 (ref 1.005–1.030)
pH: 6 (ref 5.0–8.0)

## 2017-06-20 LAB — PROTIME-INR
INR: 0.93
PROTHROMBIN TIME: 12.4 s (ref 11.4–15.2)

## 2017-06-20 LAB — CBC WITH DIFFERENTIAL/PLATELET
BASOS PCT: 1 %
Basophils Absolute: 0.1 10*3/uL (ref 0.0–0.1)
EOS ABS: 0 10*3/uL (ref 0.0–0.7)
EOS PCT: 0 %
HCT: 39.2 % (ref 36.0–46.0)
HEMOGLOBIN: 14.3 g/dL (ref 12.0–15.0)
LYMPHS ABS: 2.6 10*3/uL (ref 0.7–4.0)
Lymphocytes Relative: 39 %
MCH: 32.2 pg (ref 26.0–34.0)
MCHC: 36.5 g/dL — AB (ref 30.0–36.0)
MCV: 88.3 fL (ref 78.0–100.0)
MONOS PCT: 14 %
Monocytes Absolute: 0.9 10*3/uL (ref 0.1–1.0)
NEUTROS PCT: 46 %
Neutro Abs: 3 10*3/uL (ref 1.7–7.7)
PLATELETS: 267 10*3/uL (ref 150–400)
RBC: 4.44 MIL/uL (ref 3.87–5.11)
RDW: 12.5 % (ref 11.5–15.5)
WBC: 6.5 10*3/uL (ref 4.0–10.5)

## 2017-06-20 LAB — LIPASE, BLOOD: Lipase: 58 U/L — ABNORMAL HIGH (ref 11–51)

## 2017-06-20 LAB — I-STAT CG4 LACTIC ACID, ED: Lactic Acid, Venous: 0.86 mmol/L (ref 0.5–1.9)

## 2017-06-20 MED ORDER — ONDANSETRON HCL 4 MG/2ML IJ SOLN
4.0000 mg | Freq: Once | INTRAMUSCULAR | Status: AC
  Administered 2017-06-20: 4 mg via INTRAVENOUS
  Filled 2017-06-20: qty 2

## 2017-06-20 MED ORDER — IOPAMIDOL (ISOVUE-300) INJECTION 61%
INTRAVENOUS | Status: AC
  Administered 2017-06-20: 100 mL via INTRAVENOUS
  Filled 2017-06-20: qty 100

## 2017-06-20 MED ORDER — HYDROMORPHONE HCL 1 MG/ML IJ SOLN
1.0000 mg | Freq: Once | INTRAMUSCULAR | Status: AC
  Administered 2017-06-21: 1 mg via INTRAVENOUS
  Filled 2017-06-20: qty 1

## 2017-06-20 MED ORDER — POTASSIUM CHLORIDE 10 MEQ/100ML IV SOLN
10.0000 meq | Freq: Once | INTRAVENOUS | Status: AC
  Administered 2017-06-20: 10 meq via INTRAVENOUS
  Filled 2017-06-20: qty 100

## 2017-06-20 MED ORDER — PROMETHAZINE HCL 25 MG/ML IJ SOLN
12.5000 mg | Freq: Once | INTRAMUSCULAR | Status: AC
  Administered 2017-06-20: 12.5 mg via INTRAVENOUS
  Filled 2017-06-20: qty 1

## 2017-06-20 MED ORDER — SODIUM CHLORIDE 0.9 % IV BOLUS (SEPSIS)
30.0000 mL/kg | Freq: Once | INTRAVENOUS | Status: AC
  Administered 2017-06-20: 1974 mL via INTRAVENOUS

## 2017-06-20 MED ORDER — FAMOTIDINE IN NACL 20-0.9 MG/50ML-% IV SOLN
20.0000 mg | Freq: Once | INTRAVENOUS | Status: AC
  Administered 2017-06-20: 20 mg via INTRAVENOUS
  Filled 2017-06-20: qty 50

## 2017-06-20 MED ORDER — POTASSIUM CHLORIDE CRYS ER 20 MEQ PO TBCR
40.0000 meq | EXTENDED_RELEASE_TABLET | Freq: Once | ORAL | Status: AC
  Administered 2017-06-20: 40 meq via ORAL
  Filled 2017-06-20: qty 2

## 2017-06-20 MED ORDER — POTASSIUM CHLORIDE IN NACL 20-0.9 MEQ/L-% IV SOLN
Freq: Once | INTRAVENOUS | Status: AC
  Administered 2017-06-20: 23:00:00 via INTRAVENOUS
  Filled 2017-06-20: qty 1000

## 2017-06-20 NOTE — H&P (Addendum)
[facility] History and Physical  Catherine Butler WUJ:[id number] DOB: 11/26/1960 DOA: 06/20/2017  Referring physician: Dr Donnald GarrePfeiffer PCP: Loletta SpecterGomez, Roger David, PA-C   Chief Complaint: Nausea, vomiting, tachycardia  HPI: Catherine ClanKelly D Percle is a 56 y.o. 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 Now living w/ her parents and is nonambulatory, and has chronic pain in the L leg.  Comes to ED today w/ 4 day hx of nausea and vomiting, subj fever and hard shaking chills. No diarrhea.  In ED HR 130's and low K+ on labs.  ASked to see for admission.   [place] in [place] w/ her parents and has one son.  She is unable to walk and has chronic pain in L leg after GBS.  Finished 12th grade and worked as a Conservation officer, naturecashier.    She denies any CP, prod cough, abd pain, joint pain, back pain.   Chart: -dec 2016 > RLL CAP, acute resp failure, AKI resolved, hypokalemia treated, anemia normocytic, COPD/ asthma, tobacco use, depression -jul 2018 > abd pain and fever, CT abd negative, presumed enteritis, rx'd w/ some abx, dcd' on augmentin po.  LFT"s up , improved w time . Hepatitis panel neg.  COPD, depression, hypokalemia.  -oct 2018 > bilat UE/ LE weakness w/ urinary retention and fecal incontinence, suspected Guillian-Barre syndrome per neurology. Received IVIG for 5 days and was to f/u w/ [facility] in OP setting.   ROS  denies CP  no joint pain   + HA  no blurry vision  no rash  no diarrhea  no dysuria  no difficulty voiding  no change in urine color    Past Medical History  Past Medical History:  Diagnosis Date  . Anxiety   . Arthritis   . Asthma   . COPD (chronic obstructive pulmonary disease) (HCC)   . Depression   . Hypertension   . Reflux    Past Surgical History  Past Surgical History:  Procedure Laterality Date  . LAPAROSCOPY    . laporscopy surgery for ovarian cyst  20 years ago  . TONSILLECTOMY     Family History  Family  History  Problem Relation Age of Onset  . Diabetes Father   . Hypertension Father 50       heart attack  . Breast cancer Mother 30       lump removed, bile duct cancer  . Hypertension Brother   . Allergic rhinitis Paternal Grandfather    Social History  reports that she has been smoking cigarettes.  She has a 30.00 pack-year smoking history. she has never used smokeless tobacco. She reports that she drinks alcohol. She reports that she does not use drugs. Allergies  Allergies  Allergen Reactions  . Lipitor [Atorvastatin] Other (See Comments)    "really bad leg pain"   Home medications Prior to Admission medications   Medication Sig Start Date End Date Taking? Authorizing Provider  acetaminophen (TYLENOL) 500 MG tablet Take 1,000 mg by mouth 2 (two) times daily as needed for headache.   Yes [provider]  albuterol (PROVENTIL HFA;VENTOLIN HFA) 108 (90 Base) MCG/ACT inhaler Inhale 2 puffs into the lungs every 6 (six) hours as needed for wheezing. 06/18/15  Yes Standley BrookingGoodrich, Daniel P, MD  dimenhyDRINATE (DRAMAMINE) 50 MG tablet Take 50 mg by mouth every 8 (eight) hours as needed for nausea.   Yes [provider]  gabapentin (NEURONTIN) 400 MG capsule Take 400 mg by  mouth 4 (four) times daily.    Yes [provider]  hydrOXYzine (ATARAX/VISTARIL) 25 MG tablet Take 25 mg by mouth every 6 (six) hours as needed for anxiety.   Yes [provider]  mirtazapine (REMERON) 15 MG tablet Take 15 mg by mouth at bedtime.   Yes [provider]  omeprazole (PRILOSEC) 40 MG capsule Take 1 capsule (40 mg total) by mouth 2 (two) times daily. 01/03/17  Yes Vassie LollMadera, Carlos, MD  QUEtiapine (SEROQUEL) 400 MG tablet Take 400 mg by mouth at bedtime.   Yes [provider]   Liver Function Tests Recent Labs  Lab 06/20/17 1730  AST 28  ALT 16  ALKPHOS 143*  BILITOT 1.8*  PROT 7.1  ALBUMIN 2.8*   Recent Labs  Lab 06/20/17 1730  LIPASE 58*   CBC Recent  Labs  Lab 06/20/17 1730  WBC 6.5  NEUTROABS 3.0  HGB 14.3  HCT 39.2  MCV 88.3  PLT 267   Basic Metabolic Panel Recent Labs  Lab 06/20/17 1730  NA 129*  K 2.7*  CL 94*  CO2 21*  GLUCOSE 110*  BUN 9  CREATININE 0.49  CALCIUM 8.6*     Vitals:   06/20/17 1750 06/20/17 1829 06/20/17 1900 06/20/17 2153  BP:  121/90 (!) 123/91 (!) 137/92  Pulse:  (!) 119 (!) 116 (!) 115  Resp:  14 18 19   Temp:      TempSrc:      SpO2:  100% 100% 99%  Weight: 65.8 kg (145 lb)     Height: 5\' 3"  (1.6 m)      Exam: Gen chron ill appearing, dry mouth and lips No rash, cyanosis or gangrene Sclera anicteric, throat dry  No jvd or bruits Chest clear bilat RRR no MRG Abd soft ntnd no mass or ascites +bs GU defer MS no joint effusions or deformity Ext no LE or UE edema / no wounds or ulcers Neuro is alert, Ox 3 , nonfocal    Home meds: -albuterol HFA prn/ prilosec 40 bid -remeron 15 mg hs/ seroquel 400 hs/ vistaril 25 mg qid prn anxiety/ neurontin 400 qid -tylenol prn/ dramamine prn   Na 129  K 2.7   CO2 21  BUN 9  Cr 0.49    Alb 2.8  LFT's ok  Lipase 58  Tbili 1.8  Glu 110  Ca 8.6 WBC 6k  Hb 14  plt 267   Lact acid 0.86  Trop 0.00 UA negative  CXR (indep reviewed) > no acute disease CT abd/ pelvis >  1. Increased fecal retention, query constipation. 2. Hepatic steatosis. 3. Stable moderate-sized hiatal hernia. 4. Stigmata of chronic pancreatitis with scattered pancreatic gland calcifications. 5. Aortic atherosclerosis.   Assessment: 1. Nausea and vomiting - suspect viral syndrome vs food poisoning.  2. Vol depletion - w/ sig tachycardia 3. Hypokalemia - check Mg, on PPI 4. Chronic LE weakness 5. Hx of Guillian-Barre syndrome - oct 2018 6. Depression   Plan - admit OBS, po KCl, IVF's isotonic, pain meds, antiemetics, supp care       Jadan Rouillard D [facility] Pager [phone]   If 7PM-7AM, please contact night-coverage [email or web address] Password  [email or web address] 06/20/2017, 10:00 PM

## 2017-06-20 NOTE — ED Notes (Signed)
Bladder Scan 600ml 

## 2017-06-20 NOTE — ED Notes (Signed)
Ivin BootyJoshua- son [phone]

## 2017-06-20 NOTE — ED Triage Notes (Signed)
Per EMS, pt complains of n/v x 4 days. Pt denies pain or diarrhea. Pt was given 4mg  ODT zofran, ~300cc NS en route to hospital.

## 2017-06-20 NOTE — ED Provider Notes (Signed)
[facility] Provider Note   CSN: [id number] Arrival date & time: 06/20/17  1606     History   Chief Complaint Chief Complaint  Patient presents with  . Emesis    HPI Catherine Butler is a 56 y.o. female.  HPI Patient is brought in by EMS.  She reports she has had vomiting for 4 days.  She reports multiple episodes per day.  She denies she is having diarrhea.  She does not endorse significant or localizing abdominal pain.  Patient reports she just feels bad all over.  Denies fever.  Denies pain or burning with urination.  Patient reports she is out of all of her regular medications. Past Medical History:  Diagnosis Date  . Anxiety   . Arthritis   . Asthma   . COPD (chronic obstructive pulmonary disease) (HCC)   . Depression   . H/O alcohol abuse   . Hypertension   . Reflux   . Suicide attempt by multiple drug overdose [facility]) 2014    Patient Active Problem List   Diagnosis Date Noted  . Debility 06/23/2017  . Constipation   . Quadriplegia and quadriparesis (HCC)   . Guillain Barr syndrome (HCC)   . Nausea & vomiting 06/21/2017  . Hypokalemia 06/21/2017  . Volume depletion 06/21/2017  . Sinus tachycardia 06/21/2017  . Nausea and vomiting 06/21/2017  . Essential hypertension 04/14/2017  . Paresthesia   . 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 Abdominal pain 01/01/2017  . Sepsis (HCC) 01/01/2017  . Tobacco abuse 01/01/2017  . Abnormal LFTs 01/01/2017  . Depression   . COPD (chronic obstructive pulmonary disease) (HCC) 06/16/2015    Past Surgical History:  Procedure Laterality Date  . LAPAROSCOPY    . laporscopy surgery for ovarian cyst  20 years ago  . TONSILLECTOMY      OB History    Gravida Para Term Preterm AB Living   3 1 1   1 1    SAB TAB Ectopic Multiple Live Births   1               Home Medications    Prior to Admission medications   Medication Sig Start Date End  Date Taking? Authorizing Provider  acetaminophen (TYLENOL) 500 MG tablet Take 1,000 mg by mouth 2 (two) times daily as needed for headache.   Yes [provider]  albuterol (PROVENTIL HFA;VENTOLIN HFA) 108 (90 Base) MCG/ACT inhaler Inhale 2 puffs into the lungs every 6 (six) hours as needed for wheezing. 06/18/15  Yes Standley Brooking, MD  dimenhyDRINATE (DRAMAMINE) 50 MG tablet Take 50 mg by mouth every 8 (eight) hours as needed for nausea.   Yes [provider]  gabapentin (NEURONTIN) 400 MG capsule Take 400 mg by mouth 4 (four) times daily.    Yes [provider]  hydrOXYzine (ATARAX/VISTARIL) 25 MG tablet Take 25 mg by mouth every 6 (six) hours as needed for anxiety.   Yes [provider]  mirtazapine (REMERON) 15 MG tablet Take 15 mg by mouth at bedtime.   Yes [provider]  omeprazole (PRILOSEC) 40 MG capsule Take 1 capsule (40 mg total) by mouth 2 (two) times daily. 01/03/17  Yes Vassie Loll, MD  QUEtiapine (SEROQUEL) 400 MG tablet Take 400 mg by mouth at bedtime.   Yes [provider]  HYDROcodone-acetaminophen (NORCO/VICODIN) 5-325 MG tablet Take 1 tablet by mouth every 4 (four) hours as needed  for moderate pain. 06/23/17   Shon HaleEmokpae, Courage, MD  metoprolol tartrate (LOPRESSOR) 25 MG tablet Take 0.5 tablets (12.5 mg total) by mouth 2 (two) times daily. 06/23/17 06/23/18  Shon HaleEmokpae, Courage, MD  ondansetron (ZOFRAN) 4 MG tablet Take 1 tablet (4 mg total) by mouth every 6 (six) hours as needed for nausea. 06/23/17   Shon HaleEmokpae, Courage, MD    Family History Family History  Problem Relation Age of Onset  . Diabetes Father   . Hypertension Father 50       heart attack  . Breast cancer Mother 30       lump removed, bile duct cancer  . Hypertension Brother   . Allergic rhinitis Paternal Grandfather     Social History Social History   Tobacco Use  . Smoking status: Current Every Day Smoker    Packs/day: 1.00    Years: 30.00    Pack  years: 30.00    Types: Cigarettes  . Smokeless tobacco: Never Used  . Tobacco comment: states "might be interested in the smoking cessation program"  Pt given counseling related to smoking cessation  Substance Use Topics  . Alcohol use: Yes    Comment: couple x a week  . Drug use: No     Allergies   Lipitor [atorvastatin]   Review of Systems Review of Systems 10 Systems reviewed and are negative for acute change except as noted in the HPI.   Physical Exam Updated Vital Signs BP (!) 137/102   Pulse (!) 125   Temp 99.2 F (37.3 C) (Oral)   Resp 19   Ht 5\' 3"  (1.6 m)   Wt 68.9 kg (151 lb 14.4 oz)   LMP 02/22/2006   SpO2 100%   BMI 26.91 kg/m   Physical Exam  Constitutional:  Patient is weak and ill in appearance.  He appears mildly confused.  No respiratory distress.  HENT:  Head: Normocephalic and atraumatic.  Mucous membranes dry.  Eyes: EOM are normal. Pupils are equal, round, and reactive to light.  Neck: Neck supple.  Cardiovascular:  Tachycardia.  Distant heart sounds.  No gross rub murmur gallop.  Pulmonary/Chest: Effort normal and breath sounds normal.  Abdominal: Soft. Bowel sounds are normal. She exhibits no distension. There is no tenderness. There is no guarding.  Musculoskeletal: Normal range of motion. She exhibits no edema or tenderness.  Condition of lower extremities is good.  There is no peripheral edema.  No wounds or rashes to the lower extremities.  Neurological:  Patient is awake and alert but seems mildly confused.  When I began interview she was picking at her blood pressure cuff tubing wire to her oxygen saturation monitor.  He is using all 4 extremities purposely.  No focal motor deficit.  Mild confusion but speech is clear.  Skin: Skin is warm and dry.     ED Treatments / Results  Labs (all labs ordered are listed, but only abnormal results are displayed) Labs Reviewed  COMPREHENSIVE METABOLIC PANEL - Abnormal; Notable for the  following components:      Result Value   Sodium 129 (*)    Potassium 2.7 (*)    Chloride 94 (*)    CO2 21 (*)    Glucose, Bld 110 (*)    Calcium 8.6 (*)    Albumin 2.8 (*)    Alkaline Phosphatase 143 (*)    Total Bilirubin 1.8 (*)    All other components within normal limits  LIPASE, BLOOD - Abnormal; Notable for the  following components:   Lipase 58 (*)    All other components within normal limits  CBC WITH DIFFERENTIAL/PLATELET - Abnormal; Notable for the following components:   MCHC 36.5 (*)    All other components within normal limits  URINALYSIS, ROUTINE W REFLEX MICROSCOPIC - Abnormal; Notable for the following components:   Color, Urine AMBER (*)    Bilirubin Urine SMALL (*)    Ketones, ur 80 (*)    Protein, ur 30 (*)    Squamous Epithelial / LPF 0-5 (*)    All other components within normal limits  BLOOD GAS, VENOUS - Abnormal; Notable for the following components:   pH, Ven 7.442 (*)    pCO2, Ven 31.4 (*)    All other components within normal limits  MAGNESIUM - Abnormal; Notable for the following components:   Magnesium 1.2 (*)    All other components within normal limits  COMPREHENSIVE METABOLIC PANEL - Abnormal; Notable for the following components:   Sodium 134 (*)    Potassium 3.4 (*)    CO2 18 (*)    Creatinine, Ser 0.40 (*)    Calcium 7.2 (*)    Total Protein 4.9 (*)    Albumin 1.9 (*)    Total Bilirubin 1.3 (*)    All other components within normal limits  CBC - Abnormal; Notable for the following components:   RBC 3.36 (*)    Hemoglobin 10.3 (*)    HCT 30.5 (*)    All other components within normal limits  BASIC METABOLIC PANEL - Abnormal; Notable for the following components:   CO2 18 (*)    BUN <5 (*)    Creatinine, Ser 0.41 (*)    Calcium 7.5 (*)    All other components within normal limits  MAGNESIUM - Abnormal; Notable for the following components:   Magnesium 1.5 (*)    All other components within normal limits  URINE CULTURE  CULTURE,  BLOOD (ROUTINE X 2)  CULTURE, BLOOD (ROUTINE X 2)  PROTIME-INR  I-STAT CG4 LACTIC ACID, ED  I-STAT TROPONIN, ED    EKG  EKG Interpretation  Date/Time:  Monday June 20 2017 17:50:31 EST Ventricular Rate:  127 PR Interval:    QRS Duration: 90 QT Interval:  421 QTC Calculation: 613 R Axis:   35 Text Interpretation:  Sinus tachycardia Low voltage, extremity leads Prolonged QT interval QT longer Confirmed by Marily Memos [id number]) on 06/21/2017 10:58:02 AM       Radiology Dg Abd Portable 1v  Result Date: 06/23/2017 CLINICAL DATA:  Constipation. EXAM: PORTABLE ABDOMEN - 1 VIEW COMPARISON:  06/20/2017 CT FINDINGS: A moderate to large amount of stool within the ascending and descending colon noted. Small amount of stool in the sigmoid colon and rectum noted. No dilated small bowel loops are present. No suspicious bony lesions are identified. IMPRESSION: Moderate to large amount of stool within the ascending and descending colon which can be seen with constipation. No evidence of bowel obstruction. Electronically Signed   By: Harmon Pier M.D.   On: 06/23/2017 20:24    Procedures Procedures (including critical care time)  Medications Ordered in ED Medications  potassium chloride 20 MEQ/15ML (10%) solution 40 mEq (40 mEq Oral Given 06/21/17 1807)  sodium chloride 0.9 % bolus 1,974 mL (0 mL/kg  65.8 kg Intravenous Stopped 06/20/17 1939)  ondansetron (ZOFRAN) injection 4 mg (4 mg Intravenous Given 06/20/17 1744)  potassium chloride 10 mEq in 100 mL IVPB (0 mEq Intravenous Stopped 06/20/17 1959)  potassium  chloride SA (K-DUR,KLOR-CON) CR tablet 40 mEq (40 mEq Oral Given 06/20/17 1856)  famotidine (PEPCID) IVPB 20 mg premix (0 mg Intravenous Stopped 06/20/17 1930)  iopamidol (ISOVUE-300) 61 % injection (100 mLs Intravenous Contrast Given 06/20/17 1913)  promethazine (PHENERGAN) injection 12.5 mg (12.5 mg Intravenous Given 06/20/17 2151)  0.9 % NaCl with KCl 20 mEq/ L  infusion ( Intravenous  New Bag/Given 06/20/17 2233)  HYDROmorphone (DILAUDID) injection 1 mg (1 mg Intravenous Given 06/21/17 0007)  sodium chloride 0.9 % bolus 2,000 mL (0 mLs Intravenous Stopped 06/21/17 0536)  magnesium sulfate IVPB 2 g 50 mL (0 g Intravenous Stopped 06/21/17 1802)  magnesium sulfate IVPB 2 g 50 mL (0 g Intravenous Stopped 06/22/17 1319)  metoprolol tartrate (LOPRESSOR) tablet 25 mg (25 mg Oral Given 06/23/17 1443)     Initial Impression / Assessment and Plan / ED Course  I have reviewed the triage vital signs and the nursing notes.  Pertinent labs & imaging results that were available during my care of the patient were reviewed by me and considered in my medical decision making (see chart for details).    Consult hospitalist for admission  Final Clinical Impressions(s) / ED Diagnoses   Final diagnoses:  Dehydration  Hypokalemia  Intractable vomiting with nausea, unspecified vomiting type   Patient presents with recurrent vomiting.  She shows signs of dehydration.  She is hypokalemic.  Rehydration and potassium administration initiated in the emergency department.  Patient's abdominal examination is nonsurgical.  After hydration patient remained tachycardic.  Plan will be for admission observation for dehydration and hypokalemia with intractable vomiting.     Arby BarrettePfeiffer, Bronislaw Switzer, MD 06/24/17 1701

## 2017-06-20 NOTE — ED Notes (Signed)
Critical lab value: Potassium 2.7 Time received: 1813

## 2017-06-21 ENCOUNTER — Other Ambulatory Visit: Payer: Self-pay

## 2017-06-21 DIAGNOSIS — K861 Other chronic pancreatitis: Secondary | ICD-10-CM | POA: Diagnosis not present

## 2017-06-21 DIAGNOSIS — R000 Tachycardia, unspecified: Secondary | ICD-10-CM | POA: Diagnosis present

## 2017-06-21 DIAGNOSIS — G610 Guillain-Barre syndrome: Secondary | ICD-10-CM | POA: Diagnosis not present

## 2017-06-21 DIAGNOSIS — G8250 Quadriplegia, unspecified: Secondary | ICD-10-CM | POA: Diagnosis not present

## 2017-06-21 DIAGNOSIS — F17210 Nicotine dependence, cigarettes, uncomplicated: Secondary | ICD-10-CM | POA: Diagnosis not present

## 2017-06-21 DIAGNOSIS — E876 Hypokalemia: Secondary | ICD-10-CM | POA: Diagnosis present

## 2017-06-21 DIAGNOSIS — N390 Urinary tract infection, site not specified: Secondary | ICD-10-CM | POA: Diagnosis not present

## 2017-06-21 DIAGNOSIS — F329 Major depressive disorder, single episode, unspecified: Secondary | ICD-10-CM | POA: Diagnosis present

## 2017-06-21 DIAGNOSIS — I10 Essential (primary) hypertension: Secondary | ICD-10-CM | POA: Diagnosis present

## 2017-06-21 DIAGNOSIS — E869 Volume depletion, unspecified: Secondary | ICD-10-CM | POA: Diagnosis present

## 2017-06-21 DIAGNOSIS — Z915 Personal history of self-harm: Secondary | ICD-10-CM | POA: Diagnosis not present

## 2017-06-21 DIAGNOSIS — M6281 Muscle weakness (generalized): Secondary | ICD-10-CM | POA: Diagnosis not present

## 2017-06-21 DIAGNOSIS — E871 Hypo-osmolality and hyponatremia: Secondary | ICD-10-CM | POA: Diagnosis not present

## 2017-06-21 DIAGNOSIS — G650 Sequelae of Guillain-Barre syndrome: Secondary | ICD-10-CM | POA: Diagnosis not present

## 2017-06-21 DIAGNOSIS — F419 Anxiety disorder, unspecified: Secondary | ICD-10-CM | POA: Diagnosis not present

## 2017-06-21 DIAGNOSIS — D62 Acute posthemorrhagic anemia: Secondary | ICD-10-CM | POA: Diagnosis not present

## 2017-06-21 DIAGNOSIS — F411 Generalized anxiety disorder: Secondary | ICD-10-CM | POA: Diagnosis not present

## 2017-06-21 DIAGNOSIS — Z888 Allergy status to other drugs, medicaments and biological substances status: Secondary | ICD-10-CM | POA: Diagnosis not present

## 2017-06-21 DIAGNOSIS — K5900 Constipation, unspecified: Secondary | ICD-10-CM | POA: Diagnosis not present

## 2017-06-21 DIAGNOSIS — R112 Nausea with vomiting, unspecified: Secondary | ICD-10-CM | POA: Diagnosis not present

## 2017-06-21 DIAGNOSIS — G8929 Other chronic pain: Secondary | ICD-10-CM | POA: Diagnosis present

## 2017-06-21 DIAGNOSIS — E46 Unspecified protein-calorie malnutrition: Secondary | ICD-10-CM | POA: Diagnosis not present

## 2017-06-21 DIAGNOSIS — E860 Dehydration: Secondary | ICD-10-CM | POA: Diagnosis not present

## 2017-06-21 DIAGNOSIS — G990 Autonomic neuropathy in diseases classified elsewhere: Secondary | ICD-10-CM | POA: Diagnosis not present

## 2017-06-21 DIAGNOSIS — B349 Viral infection, unspecified: Secondary | ICD-10-CM | POA: Diagnosis present

## 2017-06-21 DIAGNOSIS — Z79899 Other long term (current) drug therapy: Secondary | ICD-10-CM | POA: Diagnosis not present

## 2017-06-21 DIAGNOSIS — R270 Ataxia, unspecified: Secondary | ICD-10-CM | POA: Diagnosis not present

## 2017-06-21 DIAGNOSIS — R5381 Other malaise: Secondary | ICD-10-CM | POA: Diagnosis not present

## 2017-06-21 DIAGNOSIS — J449 Chronic obstructive pulmonary disease, unspecified: Secondary | ICD-10-CM | POA: Diagnosis present

## 2017-06-21 DIAGNOSIS — K5901 Slow transit constipation: Secondary | ICD-10-CM | POA: Diagnosis not present

## 2017-06-21 DIAGNOSIS — K219 Gastro-esophageal reflux disease without esophagitis: Secondary | ICD-10-CM | POA: Diagnosis present

## 2017-06-21 LAB — CBC
HEMATOCRIT: 30.5 % — AB (ref 36.0–46.0)
HEMOGLOBIN: 10.3 g/dL — AB (ref 12.0–15.0)
MCH: 30.7 pg (ref 26.0–34.0)
MCHC: 33.8 g/dL (ref 30.0–36.0)
MCV: 90.8 fL (ref 78.0–100.0)
Platelets: 192 10*3/uL (ref 150–400)
RBC: 3.36 MIL/uL — ABNORMAL LOW (ref 3.87–5.11)
RDW: 12.8 % (ref 11.5–15.5)
WBC: 4.7 10*3/uL (ref 4.0–10.5)

## 2017-06-21 LAB — COMPREHENSIVE METABOLIC PANEL
ALT: 14 U/L (ref 14–54)
ANION GAP: 6 (ref 5–15)
AST: 20 U/L (ref 15–41)
Albumin: 1.9 g/dL — ABNORMAL LOW (ref 3.5–5.0)
Alkaline Phosphatase: 97 U/L (ref 38–126)
BUN: 6 mg/dL (ref 6–20)
CHLORIDE: 110 mmol/L (ref 101–111)
CO2: 18 mmol/L — ABNORMAL LOW (ref 22–32)
Calcium: 7.2 mg/dL — ABNORMAL LOW (ref 8.9–10.3)
Creatinine, Ser: 0.4 mg/dL — ABNORMAL LOW (ref 0.44–1.00)
Glucose, Bld: 88 mg/dL (ref 65–99)
POTASSIUM: 3.4 mmol/L — AB (ref 3.5–5.1)
Sodium: 134 mmol/L — ABNORMAL LOW (ref 135–145)
TOTAL PROTEIN: 4.9 g/dL — AB (ref 6.5–8.1)
Total Bilirubin: 1.3 mg/dL — ABNORMAL HIGH (ref 0.3–1.2)

## 2017-06-21 LAB — MAGNESIUM: MAGNESIUM: 1.2 mg/dL — AB (ref 1.7–2.4)

## 2017-06-21 MED ORDER — HYDROMORPHONE HCL 1 MG/ML IJ SOLN: 1.0000 mg | INTRAMUSCULAR | Status: DC | PRN

## 2017-06-21 MED ORDER — HYDROXYZINE HCL 25 MG PO TABS
25.0000 mg | ORAL_TABLET | Freq: Four times a day (QID) | ORAL | Status: DC | PRN
  Administered 2017-06-21 – 2017-06-23 (×7): 25 mg via ORAL
  Filled 2017-06-21 (×7): qty 1

## 2017-06-21 MED ORDER — MIRTAZAPINE 15 MG PO TABS
15.0000 mg | ORAL_TABLET | Freq: Every day | ORAL | Status: DC
  Administered 2017-06-21 – 2017-06-22 (×2): 15 mg via ORAL
  Filled 2017-06-21 (×2): qty 1

## 2017-06-21 MED ORDER — ACETAMINOPHEN 325 MG PO TABS
650.0000 mg | ORAL_TABLET | Freq: Four times a day (QID) | ORAL | Status: DC | PRN
  Administered 2017-06-21 – 2017-06-22 (×4): 650 mg via ORAL
  Filled 2017-06-21 (×4): qty 2

## 2017-06-21 MED ORDER — HYDROMORPHONE HCL 1 MG/ML IJ SOLN
1.0000 mg | INTRAMUSCULAR | Status: DC | PRN
  Administered 2017-06-21 – 2017-06-23 (×9): 1 mg via INTRAVENOUS
  Filled 2017-06-21 (×9): qty 1

## 2017-06-21 MED ORDER — ENOXAPARIN SODIUM 40 MG/0.4ML ~~LOC~~ SOLN
40.0000 mg | SUBCUTANEOUS | Status: DC
  Administered 2017-06-21 – 2017-06-23 (×3): 40 mg via SUBCUTANEOUS
  Filled 2017-06-21 (×3): qty 0.4

## 2017-06-21 MED ORDER — ONDANSETRON HCL 4 MG PO TABS: 4.0000 mg | ORAL_TABLET | Freq: Four times a day (QID) | ORAL | Status: DC | PRN

## 2017-06-21 MED ORDER — POTASSIUM CHLORIDE 20 MEQ/15ML (10%) PO SOLN
40.0000 meq | ORAL | Status: AC
  Administered 2017-06-21 (×4): 40 meq via ORAL
  Filled 2017-06-21 (×7): qty 30

## 2017-06-21 MED ORDER — MAGNESIUM SULFATE 2 GM/50ML IV SOLN
2.0000 g | Freq: Once | INTRAVENOUS | Status: AC
  Administered 2017-06-21: 2 g via INTRAVENOUS
  Filled 2017-06-21: qty 50

## 2017-06-21 MED ORDER — POTASSIUM CHLORIDE IN NACL 20-0.9 MEQ/L-% IV SOLN
INTRAVENOUS | Status: DC
  Administered 2017-06-21 – 2017-06-23 (×5): via INTRAVENOUS
  Filled 2017-06-21 (×8): qty 1000

## 2017-06-21 MED ORDER — SODIUM CHLORIDE 0.9 % IV BOLUS (SEPSIS)
2000.0000 mL | Freq: Once | INTRAVENOUS | Status: AC
  Administered 2017-06-21: 2000 mL via INTRAVENOUS

## 2017-06-21 MED ORDER — PANTOPRAZOLE SODIUM 40 MG PO TBEC
80.0000 mg | DELAYED_RELEASE_TABLET | Freq: Every day | ORAL | Status: DC
  Administered 2017-06-21 – 2017-06-23 (×3): 80 mg via ORAL
  Filled 2017-06-21 (×3): qty 2

## 2017-06-21 MED ORDER — ACETAMINOPHEN 650 MG RE SUPP: 650.0000 mg | Freq: Four times a day (QID) | RECTAL | Status: DC | PRN

## 2017-06-21 MED ORDER — QUETIAPINE FUMARATE 100 MG PO TABS
400.0000 mg | ORAL_TABLET | Freq: Every day | ORAL | Status: DC
  Administered 2017-06-21 – 2017-06-22 (×2): 400 mg via ORAL
  Filled 2017-06-21 (×2): qty 4

## 2017-06-21 MED ORDER — ALBUTEROL SULFATE (2.5 MG/3ML) 0.083% IN NEBU: 2.5000 mg | INHALATION_SOLUTION | Freq: Four times a day (QID) | RESPIRATORY_TRACT | Status: DC | PRN

## 2017-06-21 MED ORDER — ONDANSETRON HCL 4 MG/2ML IJ SOLN
4.0000 mg | Freq: Four times a day (QID) | INTRAMUSCULAR | Status: DC | PRN
  Administered 2017-06-21 – 2017-06-22 (×4): 4 mg via INTRAVENOUS
  Filled 2017-06-21 (×4): qty 2

## 2017-06-21 MED ORDER — GABAPENTIN 400 MG PO CAPS
400.0000 mg | ORAL_CAPSULE | Freq: Four times a day (QID) | ORAL | Status: DC
  Administered 2017-06-21 – 2017-06-23 (×10): 400 mg via ORAL
  Filled 2017-06-21 (×10): qty 1

## 2017-06-21 NOTE — Progress Notes (Addendum)
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  Patient was found to be tachycardic and hypokalemic on presentation.  Patient is currently nonambulatory , has chronic lower extremity pain.  She currently lives at home with her parents. Patient was admitted for the management of dehydration, increased generalized weakness and hypokalemia.  Assessment & Plan:   Principal Problem:   Nausea & vomiting Active Problems:   Depression   Bilateral leg weakness - chronic, after Guillian Barre episode   Essential hypertension   Hypokalemia   Volume depletion   Sinus tachycardia   Nausea and vomiting  Nausea and vomiting: Secondary to viral syndrome versus food poisoning.  Much improved this morning.  Continue IV fluids.  Continue antiemetics as necessary. She was on clear liquid diet.  Will advance the diet as tolerated.  Volume depletion: Continue IV fluids.  Presented with sinus tachycardia which has slightly  Improved but still tachy.  Severe hypokalemia/hypomagnesemia: Present on admission.  Patient getting fluid with potassium.  We will check the levels tomorrow.  Magnesium level was also found to be low and supplemented .  Chronic lower extremity weakness/Guillain-Barr syndrome: Patient has been subsequently disabled after Guillain-Barr syndrome which was diagnosed.  She was treated with IV hemoglobin at that time.  She follows with neurology as an outpatient. Patient is currently living at home with the family.  She was being followed by physical therapy sometime earlier but not now.  Patient requesting for physical therapy here.   Consult placed  Depression: Continue her home meds.    DVT prophylaxis: Lovenox Code Status: Full Family Communication: None present on the bedside Disposition Plan: Home versus skilled nursing facility   Consultants: None  Procedures:None  Antimicrobials:None  Subjective: Patient seen and examined the bedside this afternoon.  Her overall status has improved since yesterday.  Still feels weak.  Nausea and vomiting has improved.  Denies any bdominal pain  Objective: Vitals:   06/21/17 0100 06/21/17 0146 06/21/17 0512 06/21/17 1345  BP: 127/89 127/86 (!) 143/96 (!) 142/99  Pulse: (!) 116 (!) 117 (!) 118 (!) 114  Resp: 17  20   Temp:  98.2 F (36.8 C) 97.9 F (36.6 C) (!) 97.4 F (36.3 C)  TempSrc:  Oral Oral Oral  SpO2: 100% 99% 98% 100%  Weight:   66.9 kg (147 lb 7.8 oz)   Height:        Intake/Output Summary (Last 24 hours) at 06/21/2017 1449 Last data filed at 06/21/2017 1345 Gross per 24 hour  Intake 5392.33 ml  Output 840 ml  Net 4552.33 ml   Filed Weights   06/20/17 1750 06/21/17 0512  Weight: 65.8 kg (145 lb) 66.9 kg (147 lb 7.8 oz)    Examination:  General exam: Appears calm ,Not in distress,average built Respiratory system: Bilateral equal air entry, normal vesicular breath sounds, no wheezes or crackles  Cardiovascular system: Sinus tachycardia, S1 & S2 heard, RRR. No JVD, murmurs, rubs, gallops or clicks. No pedal edema. Gastrointestinal system: Abdomen is nondistended, soft and nontender. No organomegaly or masses felt. Normal bowel sounds heard. Central nervous system: Alert and oriented. No focal neurological deficits. Extremities: No edema, no clubbing ,no cyanosis, distal  peripheral pulses palpable. Bilateral lower extremity weakness Skin: No rashes, lesions or ulcers,no icterus ,no pallor Psychiatry: Judgement and insight appear normal. Mood & affect appropriate.     Data Reviewed: I have personally reviewed following labs and imaging  studies  CBC: Recent Labs  Lab 06/20/17 1730 06/21/17 0627  WBC 6.5 4.7  NEUTROABS 3.0  --   HGB 14.3 10.3*  HCT 39.2 30.5*  MCV 88.3 90.8  PLT 267 192   Basic Metabolic Panel: Recent Labs  Lab 06/20/17 1730 06/20/17 1734 06/21/17 0627  NA 129*  --  134*  K 2.7*  --  3.4*  CL 94*  --  110  CO2 21*  --  18*  GLUCOSE 110*  --  88  BUN 9  --  6  CREATININE 0.49  --  0.40*  CALCIUM 8.6*  --  7.2*  MG  --  1.2*  --    GFR: Estimated Creatinine Clearance: 73.9 mL/min (A) (by C-G formula based on SCr of 0.4 mg/dL (L)). Liver Function Tests: Recent Labs  Lab 06/20/17 1730 06/21/17 0627  AST 28 20  ALT 16 14  ALKPHOS 143* 97  BILITOT 1.8* 1.3*  PROT 7.1 4.9*  ALBUMIN 2.8* 1.9*   Recent Labs  Lab 06/20/17 1730  LIPASE 58*   No results for input(s): AMMONIA in the last 168 hours. Coagulation Profile: Recent Labs  Lab 06/20/17 1730  INR 0.93   Cardiac Enzymes: No results for input(s): CKTOTAL, CKMB, CKMBINDEX, TROPONINI in the last 168 hours. BNP (last 3 results) No results for input(s): PROBNP in the last 8760 hours. HbA1C: No results for input(s): HGBA1C in the last 72 hours. CBG: No results for input(s): GLUCAP in the last 168 hours. Lipid Profile: No results for input(s): CHOL, HDL, LDLCALC, TRIG, CHOLHDL, LDLDIRECT in the last 72 hours. Thyroid Function Tests: No results for input(s): TSH, T4TOTAL, FREET4, T3FREE, THYROIDAB in the last 72 hours. Anemia Panel: No results for input(s): VITAMINB12, FOLATE, FERRITIN, TIBC, IRON, RETICCTPCT in the last 72 hours. Sepsis Labs: Recent Labs  Lab 06/20/17 1737  LATICACIDVEN 0.86    No results found for this or any previous visit (from the past 240 hour(s)).       Radiology Studies: Ct Abdomen Pelvis W Contrast  Result Date: 06/20/2017 CLINICAL DATA:  Nausea and vomiting x4 days. EXAM: CT ABDOMEN AND PELVIS WITH CONTRAST TECHNIQUE: Multidetector CT imaging of the abdomen and pelvis was performed  using the standard protocol following bolus administration of intravenous contrast. CONTRAST:  100mL ISOVUE-300 IOPAMIDOL (ISOVUE-300) INJECTION 61% COMPARISON:  03/26/2017 CT FINDINGS: Lower chest: Moderate-sized hiatal hernia. Normal sized heart without pericardial effusion. No active pulmonary disease. Minimal subsegmental atelectasis in the right lower lobe. Hepatobiliary: Hepatic steatosis. Nondistended gallbladder. No cholelithiasis or biliary dilatation. Pancreas: Atrophic pancreas without acute inflammation or space-occupying mass. No ductal dilatation. Scattered calcifications of the pancreatic gland consistent with stigmata of chronic pancreatitis. Spleen: Normal in size without focal abnormality. Adrenals/Urinary Tract: Adrenal glands are unremarkable. Kidneys are normal, without renal calculi, focal lesion, or hydronephrosis. Bladder is unremarkable. Stomach/Bowel: Nondistended stomach with normal small bowel rotation. Normal appendix. Large amount of fecal retention throughout the colon. Vascular/Lymphatic: Mild aortic atherosclerosis. No aneurysm or dissection. No lymphadenopathy. Reproductive: Atrophic uterus without adnexal mass. Other: No free air nor free fluid. Musculoskeletal: Chronic superior endplate compression of T11. No acute osseous findings. IMPRESSION: 1. Increased fecal retention, query constipation. 2. Hepatic steatosis. 3. Stable moderate-sized hiatal hernia. 4. Stigmata of chronic pancreatitis  with scattered pancreatic gland calcifications. 5. Aortic atherosclerosis. Electronically Signed   By: Tollie Eth M.D.   On: 06/20/2017 19:35   Dg Chest Port 1 View  Result Date: 06/20/2017 CLINICAL DATA:  Nausea and vomiting x 4 days - hx bladder pretension - Hx COPD - current everyday smoker EXAM: PORTABLE CHEST 1 VIEW COMPARISON:  CT 04/18/2017.  Chest radiograph 01/01/2017. FINDINGS: Hyperinflation. Remote left rib fractures. Midline trachea. Normal heart size. Moderate hiatal hernia.  No pleural effusion or pneumothorax. Biapical pleural thickening. Clear lungs. IMPRESSION: No acute cardiopulmonary disease. Hiatal hernia. Electronically Signed   By: Jeronimo Greaves M.D.   On: 06/20/2017 18:05        Scheduled Meds: . enoxaparin (LOVENOX) injection  40 mg Subcutaneous Q24H  . gabapentin  400 mg Oral QID  . mirtazapine  15 mg Oral QHS  . pantoprazole  80 mg Oral Daily  . potassium chloride  40 mEq Oral Q4H  . QUEtiapine  400 mg Oral QHS   Continuous Infusions: . 0.9 % NaCl with KCl 20 mEq / L 150 mL/hr at 06/21/17 0607     LOS: 0 days    Time spent: 25 minutes    Eleanna Theilen Salli Quarry, MD [facility] Pager [phone]  If 7PM-7AM, please contact night-coverage [email or web address] Password [facility] 06/21/2017, 2:49 PM

## 2017-06-21 NOTE — ED Notes (Signed)
ED TO INPATIENT HANDOFF REPORT  Name/Age/Gender Catherine Butler 57 y.o. female  Code Status Code Status History    Date Active Date Inactive Code Status Order ID Comments User Context   04/11/2017 17:57 04/20/2017 18:03 Full Code [id number]  Merton Border, MD ED   01/01/2017 21:01 01/03/2017 18:25 Full Code [id number]  Ivor Costa, MD ED   06/16/2015 19:34 06/18/2015 20:38 Full Code [id number]  Doree Albee, MD ED   02/08/2013 11:42 02/09/2013 14:56 Full Code [id number]  Hyman Bible, PA-C ED   09/13/2012 02:22 09/13/2012 10:48 Full Code [id number]  Ezequiel Essex, MD ED      Home/SNF/Other Home  Chief Complaint nausea/emesis  Level of Care/Admitting Diagnosis ED Disposition    ED Disposition Condition [facility] Area: [facility] [[id number]]  Level of Care: Med-Surg [16]  Diagnosis: Nausea & vomiting [595638]  Admitting Physician: Roney Jaffe [[id number]]  Attending Physician: Roney Jaffe [[id number]]  PT Class (Do Not Modify): Observation [104]  PT Acc Code (Do Not Modify): Observation [10022]       Medical History Past Medical History:  Diagnosis Date  . Anxiety   . Arthritis   . Asthma   . COPD (chronic obstructive pulmonary disease) ([place])   . Depression   . Hypertension   . Reflux     Allergies Allergies  Allergen Reactions  . Lipitor [Atorvastatin] Other (See Comments)    "really bad leg pain"    IV Location/Drains/Wounds Patient Lines/Drains/Airways Status   Active Line/Drains/Airways    Name:   Placement date:   Placement time:   Site:   Days:   Peripheral IV 06/20/17 Left Antecubital   06/20/17    1736    Antecubital   1   Urethral Catheter [place],RN 14 Fr.   04/20/17    0600    -   62   Wound / Incision (Open or Dehisced) 04/13/17 Puncture Lumbar Mid LP    04/13/17    1639    Lumbar   69          Labs/Imaging Results for orders placed or performed during the hospital encounter of 06/20/17 (from the  past 48 hour(s))  Comprehensive metabolic panel     Status: Abnormal   Collection Time: 06/20/17  5:30 PM  Result Value Ref Range   Sodium 129 (L) 135 - 145 mmol/L   Potassium 2.7 (LL) 3.5 - 5.1 mmol/L    Comment: CRITICAL RESULT CALLED TO, READ BACK BY AND VERIFIED WITH: STANHOPE,M. RN '@1813'$  ON 12.31.18 BY COHEN,K    Chloride 94 (L) 101 - 111 mmol/L   CO2 21 (L) 22 - 32 mmol/L   Glucose, Bld 110 (H) 65 - 99 mg/dL   BUN 9 6 - 20 mg/dL   Creatinine, Ser 0.49 0.44 - 1.00 mg/dL   Calcium 8.6 (L) 8.9 - 10.3 mg/dL   Total Protein 7.1 6.5 - 8.1 g/dL   Albumin 2.8 (L) 3.5 - 5.0 g/dL   AST 28 15 - 41 U/L   ALT 16 14 - 54 U/L   Alkaline Phosphatase 143 (H) 38 - 126 U/L   Total Bilirubin 1.8 (H) 0.3 - 1.2 mg/dL   GFR calc non Af Amer >60 >60 mL/min   GFR calc Af Amer >60 >60 mL/min    Comment: (NOTE) The eGFR has been calculated using the CKD EPI equation. This calculation has not been validated in all clinical situations. eGFR's persistently <60 mL/min signify possible  Chronic Kidney Disease.    Anion gap 14 5 - 15  Lipase, blood     Status: Abnormal   Collection Time: 06/20/17  5:30 PM  Result Value Ref Range   Lipase 58 (H) 11 - 51 U/L  CBC with Differential     Status: Abnormal   Collection Time: 06/20/17  5:30 PM  Result Value Ref Range   WBC 6.5 4.0 - 10.5 K/uL   RBC 4.44 3.87 - 5.11 MIL/uL   Hemoglobin 14.3 12.0 - 15.0 g/dL   HCT 39.2 36.0 - 46.0 %   MCV 88.3 78.0 - 100.0 fL   MCH 32.2 26.0 - 34.0 pg   MCHC 36.5 (H) 30.0 - 36.0 g/dL   RDW 12.5 11.5 - 15.5 %   Platelets 267 150 - 400 K/uL   Neutrophils Relative % 46 %   Neutro Abs 3.0 1.7 - 7.7 K/uL   Lymphocytes Relative 39 %   Lymphs Abs 2.6 0.7 - 4.0 K/uL   Monocytes Relative 14 %   Monocytes Absolute 0.9 0.1 - 1.0 K/uL   Eosinophils Relative 0 %   Eosinophils Absolute 0.0 0.0 - 0.7 K/uL   Basophils Relative 1 %   Basophils Absolute 0.1 0.0 - 0.1 K/uL  Protime-INR     Status: None   Collection Time: 06/20/17   5:30 PM  Result Value Ref Range   Prothrombin Time 12.4 11.4 - 15.2 seconds   INR 0.93   Blood gas, venous     Status: Abnormal   Collection Time: 06/20/17  5:30 PM  Result Value Ref Range   pH, Ven 7.442 (H) 7.250 - 7.430   pCO2, Ven 31.4 (L) 44.0 - 60.0 mmHg   pO2, Ven 39.9 32.0 - 45.0 mmHg   Bicarbonate 21.1 20.0 - 28.0 mmol/L   Acid-base deficit 1.6 0.0 - 2.0 mmol/L   O2 Saturation 71.7 %   Patient temperature 98.6    Collection site DRAWN BY RN    Drawn by [phone]    Sample type VENOUS   I-stat troponin, ED     Status: None   Collection Time: 06/20/17  5:35 PM  Result Value Ref Range   Troponin i, poc 0.00 0.00 - 0.08 ng/mL   Comment 3            Comment: Due to the release kinetics of cTnI, a negative result within the first hours of the onset of symptoms does not rule out myocardial infarction with certainty. If myocardial infarction is still suspected, repeat the test at appropriate intervals.   I-Stat CG4 Lactic Acid, ED     Status: None   Collection Time: 06/20/17  5:37 PM  Result Value Ref Range   Lactic Acid, Venous 0.86 0.5 - 1.9 mmol/L  Urinalysis, Routine w reflex microscopic     Status: Abnormal   Collection Time: 06/20/17  6:09 PM  Result Value Ref Range   Color, Urine AMBER (A) YELLOW    Comment: BIOCHEMICALS MAY BE AFFECTED BY COLOR   APPearance CLEAR CLEAR   Specific Gravity, Urine 1.020 1.005 - 1.030   pH 6.0 5.0 - 8.0   Glucose, UA NEGATIVE NEGATIVE mg/dL   Hgb urine dipstick NEGATIVE NEGATIVE   Bilirubin Urine SMALL (A) NEGATIVE   Ketones, ur 80 (A) NEGATIVE mg/dL   Protein, ur 30 (A) NEGATIVE mg/dL   Nitrite NEGATIVE NEGATIVE   Leukocytes, UA NEGATIVE NEGATIVE   RBC / HPF 0-5 0 - 5 RBC/hpf   WBC, UA  0-5 0 - 5 WBC/hpf   Bacteria, UA NONE SEEN NONE SEEN   Squamous Epithelial / LPF 0-5 (A) NONE SEEN   Mucus PRESENT    Hyaline Casts, UA PRESENT    Ct Abdomen Pelvis W Contrast  Result Date: 06/20/2017 CLINICAL DATA:  Nausea and vomiting x4  days. EXAM: CT ABDOMEN AND PELVIS WITH CONTRAST TECHNIQUE: Multidetector CT imaging of the abdomen and pelvis was performed using the standard protocol following bolus administration of intravenous contrast. CONTRAST:  134m ISOVUE-300 IOPAMIDOL (ISOVUE-300) INJECTION 61% COMPARISON:  03/26/2017 CT FINDINGS: Lower chest: Moderate-sized hiatal hernia. Normal sized heart without pericardial effusion. No active pulmonary disease. Minimal subsegmental atelectasis in the right lower lobe. Hepatobiliary: Hepatic steatosis. Nondistended gallbladder. No cholelithiasis or biliary dilatation. Pancreas: Atrophic pancreas without acute inflammation or space-occupying mass. No ductal dilatation. Scattered calcifications of the pancreatic gland consistent with stigmata of chronic pancreatitis. Spleen: Normal in size without focal abnormality. Adrenals/Urinary Tract: Adrenal glands are unremarkable. Kidneys are normal, without renal calculi, focal lesion, or hydronephrosis. Bladder is unremarkable. Stomach/Bowel: Nondistended stomach with normal small bowel rotation. Normal appendix. Large amount of fecal retention throughout the colon. Vascular/Lymphatic: Mild aortic atherosclerosis. No aneurysm or dissection. No lymphadenopathy. Reproductive: Atrophic uterus without adnexal mass. Other: No free air nor free fluid. Musculoskeletal: Chronic superior endplate compression of T11. No acute osseous findings. IMPRESSION: 1. Increased fecal retention, query constipation. 2. Hepatic steatosis. 3. Stable moderate-sized hiatal hernia. 4. Stigmata of chronic pancreatitis with scattered pancreatic gland calcifications. 5. Aortic atherosclerosis. Electronically Signed   By: DAshley RoyaltyM.D.   On: 06/20/2017 19:35   Dg Chest Port 1 View  Result Date: 06/20/2017 CLINICAL DATA:  Nausea and vomiting x 4 days - hx bladder pretension - Hx COPD - current everyday smoker EXAM: PORTABLE CHEST 1 VIEW COMPARISON:  CT 04/18/2017.  Chest radiograph  01/01/2017. FINDINGS: Hyperinflation. Remote left rib fractures. Midline trachea. Normal heart size. Moderate hiatal hernia. No pleural effusion or pneumothorax. Biapical pleural thickening. Clear lungs. IMPRESSION: No acute cardiopulmonary disease. Hiatal hernia. Electronically Signed   By: KAbigail MiyamotoM.D.   On: 06/20/2017 18:05    Pending Labs Unresulted Labs (From admission, onward)   Start     Ordered   06/21/17 0017  Magnesium  Add-on,   R     06/21/17 0016   06/20/17 1717  Urine culture  STAT,   STAT     06/20/17 1717   06/20/17 1717  Culture, blood (routine x 2)  BLOOD CULTURE X 2,   STAT     06/20/17 1717   Signed and Held  Comprehensive metabolic panel  Tomorrow morning,   R     Signed and Held   Signed and Held  CBC  Tomorrow morning,   R     Signed and Held   Signed and Held  Creatinine, serum  (enoxaparin (LOVENOX)    CrCl >/= 30 ml/min)  Weekly,   R    Comments:  while on enoxaparin therapy    Signed and Held      Vitals/Pain Today's Vitals   06/21/17 0030 06/21/17 0045 06/21/17 0100 06/21/17 0104  BP: (!) 119/99 (!) 124/92 127/89   Pulse: (!) 118 (!) 117 (!) 116   Resp: '15 16 17   '$ Temp:      TempSrc:      SpO2: 96% 96% 100%   Weight:      Height:      PainSc:    3  Isolation Precautions No active isolations  Medications Medications  potassium chloride 20 MEQ/15ML (10%) solution 40 mEq (not administered)  HYDROmorphone (DILAUDID) injection 1 mg (not administered)  sodium chloride 0.9 % bolus 2,000 mL (not administered)  sodium chloride 0.9 % bolus 1,974 mL (0 mL/kg  65.8 kg Intravenous Stopped 06/20/17 1939)  ondansetron (ZOFRAN) injection 4 mg (4 mg Intravenous Given 06/20/17 1744)  potassium chloride 10 mEq in 100 mL IVPB (0 mEq Intravenous Stopped 06/20/17 1959)  potassium chloride SA (K-DUR,KLOR-CON) CR tablet 40 mEq (40 mEq Oral Given 06/20/17 1856)  famotidine (PEPCID) IVPB 20 mg premix (0 mg Intravenous Stopped 06/20/17 1930)  iopamidol  (ISOVUE-300) 61 % injection (100 mLs Intravenous Contrast Given 06/20/17 1913)  promethazine (PHENERGAN) injection 12.5 mg (12.5 mg Intravenous Given 06/20/17 2151)  0.9 % NaCl with KCl 20 mEq/ L  infusion ( Intravenous New Bag/Given 06/20/17 2233)  HYDROmorphone (DILAUDID) injection 1 mg (1 mg Intravenous Given 06/21/17 0007)    Mobility non-ambulatory

## 2017-06-22 LAB — BASIC METABOLIC PANEL
ANION GAP: 6 (ref 5–15)
CO2: 18 mmol/L — ABNORMAL LOW (ref 22–32)
Calcium: 7.5 mg/dL — ABNORMAL LOW (ref 8.9–10.3)
Chloride: 111 mmol/L (ref 101–111)
Creatinine, Ser: 0.41 mg/dL — ABNORMAL LOW (ref 0.44–1.00)
Glucose, Bld: 81 mg/dL (ref 65–99)
POTASSIUM: 4.1 mmol/L (ref 3.5–5.1)
SODIUM: 135 mmol/L (ref 135–145)

## 2017-06-22 LAB — URINE CULTURE: CULTURE: NO GROWTH

## 2017-06-22 LAB — MAGNESIUM: MAGNESIUM: 1.5 mg/dL — AB (ref 1.7–2.4)

## 2017-06-22 MED ORDER — MAGNESIUM SULFATE 2 GM/50ML IV SOLN
2.0000 g | Freq: Once | INTRAVENOUS | Status: AC
  Administered 2017-06-22: 2 g via INTRAVENOUS
  Filled 2017-06-22: qty 50

## 2017-06-22 MED ORDER — ALUM & MAG HYDROXIDE-SIMETH 200-200-20 MG/5ML PO SUSP
15.0000 mL | Freq: Four times a day (QID) | ORAL | Status: DC | PRN
  Administered 2017-06-22: 15 mL via ORAL
  Filled 2017-06-22: qty 30

## 2017-06-22 NOTE — Evaluation (Signed)
Occupational Therapy Evaluation Patient Details Name: Catherine Butler MRN: [id number] DOB: 07-08-1960 Today's Date: 06/22/2017    History of Present Illness Pt admitted with c/o nausea/vomiting and weakness.  Pt with hx of Guillain Barre dx 10/18 as well as COPD and depression   Clinical Impression   This 57 year old female is known to me from previous admission.  She now comes from home after being at [facility] for rehab; she reports she had a very bad experience at [facility].  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 Pt has had multiple falls and largely completes adls from bed level and uses w/c for mobility. Goals increase independence with adls from sit to stand level with mostly min A.  Pt currently needs min/mod A to stand and min A from hospital bed utilizing rails.  Issued reacher and long sponge as well as squeeze ball and super soft theraputty today.  Pt is very motivated and would benefit from CIR level of therapies.    Follow Up Recommendations  CIR    Equipment Recommendations  None recommended by OT    Recommendations for Other Services       Precautions / Restrictions Precautions Precautions: Fall Restrictions Weight Bearing Restrictions: No      Mobility Bed Mobility Overal bed mobility: Needs Assistance Bed Mobility: Sit to Supine     Supine to sit: Min assist;HOB elevated Sit to supine: Min assist;Mod assist;HOB elevated   General bed mobility comments: use of bed rail; light assist for trunk sitting up and assist for legs for back to bed  Transfers Overall transfer level: Needs assistance Equipment used: 1 person hand held assist Transfers: Sit to/from Stand Sit to Stand: Min assist;Mod assist Stand pivot transfers: Min assist;Mod assist;+2 safety/equipment       General transfer comment: stood and sidestepped up [facility]    Balance Overall balance assessment: Needs assistance Sitting-balance support: Bilateral upper  extremity supported;Feet supported Sitting balance-Leahy Scale: Fair     Standing balance support: Bilateral upper extremity supported Standing balance-Leahy Scale: Poor                             ADL either performed or assessed with clinical judgement   ADL Overall ADL's : Needs assistance/impaired Eating/Feeding: Set up;Sitting   Grooming: Supervision/safety;Sitting   Upper Body Bathing: Min guard;Sitting   Lower Body Bathing: Minimal assistance;Bed level   Upper Body Dressing : Min guard;Sitting   Lower Body Dressing: Moderate assistance;Sit to/from stand                 General ADL Comments: pt has been completing from bed level.  Pt can hold long sponge and reacher, so issued these to her. Also issued ball and super soft putty to try to work with to strengthen hands; pt continues to have hypersensitivity.  Does not have enough strength for sock aide     Vision         Perception     Praxis      Pertinent Vitals/Pain Pain Assessment: 0-10 Pain Score: 6  Pain Location: hands and feet Pain Descriptors / Indicators: Grimacing;Sore;Tender Pain Intervention(s): Limited activity within patient's tolerance;Monitored during session;Premedicated before session;Repositioned     Hand Dominance     Extremity/Trunk Assessment Upper Extremity Assessment Upper Extremity Assessment: Generalized weakness RUE Deficits / Details: pt with weak grasps bil; ulnar side of hand stronger than radial  side.  Painful.  Arm strength grossly 3+/5 RUE Sensation: history of peripheral neuropathy RUE Coordination: decreased fine motor LUE Deficits / Details: weak grasp as above. 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      Prior Functioning/Environment Level of Independence: Needs assistance  Gait / Transfers Assistance Needed: Pt has regressed to largely using WC for mobility ADL's / Homemaking Assistance Needed: Assist of mother   Comments: pt states she completes ADLs from bed level        OT Problem List: Decreased strength;Decreased activity tolerance;Impaired balance (sitting and/or standing);Decreased coordination;Decreased knowledge of use of DME or AE;Impaired sensation;Pain      OT Treatment/Interventions: Self-care/ADL training;Therapeutic exercise;DME and/or AE instruction;Therapeutic activities;Patient/family education;Balance training    OT Goals(Current goals can be found in the care plan section) Acute Rehab OT Goals Patient Stated Goal: Get stronger OT Goal Formulation: With patient Time For Goal Achievement: 07/06/17 Potential to Achieve Goals: Good ADL Goals Pt Will Perform Lower Body Bathing: with min assist;with adaptive equipment;sit to/from stand Pt Will Perform Lower Body Dressing: with min assist;sit to/from stand;with adaptive equipment Pt Will Transfer to Toilet: with min assist;stand pivot transfer;bedside commode Pt Will Perform Toileting - Clothing Manipulation and hygiene: with min assist;sitting/lateral leans Additional ADL Goal #1: pt will perform bed mobility from flat bed at min guard level Additional ADL Goal #2: pt will be independent with bil UE strengthening program utilizing ball/putty for arms and AROM vs level one theraband for arms to increase strength for adls  OT Frequency: Min 3X/week   Barriers to D/C:             Co-evaluation              AM-PAC PT "6 Clicks" Daily Activity     Outcome Measure Help from another person eating meals?: A Little Help from another person taking care of personal grooming?: A Little Help from another person toileting, which includes using toliet, bedpan, or urinal?: A Lot Help from another person bathing (including washing, rinsing, drying)?: A Little Help from another person to put on and taking off regular upper body clothing?: A Little Help from another person to put on and taking off regular lower body clothing?: A Lot 6 Click Score: 16   End of Session    Activity Tolerance: Patient tolerated treatment well Patient left: in bed;with call bell/phone within reach;with bed alarm set  OT Visit Diagnosis: Unsteadiness on feet (R26.81);Muscle weakness (generalized) (M62.81);Pain Pain - Right/Left: (bil) Pain - part of body: Hand;Ankle and joints of foot                Time: [id number] OT  Time Calculation (min): 24 min Charges:  OT General Charges $OT Visit: 1 Visit OT Evaluation $OT Eval Moderate Complexity: 1 Mod OT Treatments $Self Care/Home Management : 8-22 mins G-Codes:     [place], OTR/L [phone] 06/22/2017  Lizann Edelman 06/22/2017, 3:42 PM

## 2017-06-22 NOTE — Progress Notes (Signed)
Rehab admissions - I received a request to screen for possible acute inpatient rehab admission.  I will come see patient in the am to discuss rehab options.  I will also await OT evaluation as well.  Call me for questions.  #[phone]

## 2017-06-22 NOTE — Progress Notes (Signed)
Physical Therapy Treatment Patient Details Name: Catherine Butler MRN: [id number] DOB: 1960-12-24 Today's Date: 06/22/2017    History of Present Illness Pt admitted with c/o nausea/vomiting and weakness.  Pt with hx of Guillain Barre dx 10/18 as well as COPD and depression    PT Comments    Pt fatigued sitting up in chair and assisted to stand, pvt and step back to bed with RW.     Follow Up Recommendations  CIR     Equipment Recommendations  None recommended by PT    Recommendations for Other Services OT consult;Rehab consult     Precautions / Restrictions Precautions Precautions: Fall Restrictions Weight Bearing Restrictions: No    Mobility  Bed Mobility Overal bed mobility: Needs Assistance Bed Mobility: Sit to Supine     Supine to sit: Min assist;Mod assist Sit to supine: Min assist;Mod assist   General bed mobility comments: use of bed rail and assist with LEs  Transfers Overall transfer level: Needs assistance   Transfers: Sit to/from Stand Sit to Stand: Min assist;Mod assist;+2 safety/equipment Stand pivot transfers: Min assist;Mod assist;+2 safety/equipment       General transfer comment: cues for safe transition position and use of UEs to self assist.  Physical assist to bring wt up and fwd and to stabilize in standing  Ambulation/Gait Ambulation/Gait assistance: Min assist;Mod assist;+2 safety/equipment Ambulation Distance (Feet): 3 Feet Assistive device: Rolling walker (2 wheeled) Gait Pattern/deviations: Step-through pattern;Decreased step length - right;Decreased step length - left;Shuffle;Antalgic;Staggering left;Staggering right;Wide base of support Gait velocity: decr Gait velocity interpretation: Below normal speed for age/gender General Gait Details: Increased time and concentration for foot placement.  Cues for posture and position from RW.  Physical assist for balance, support and RW management   Stairs            Wheelchair Mobility     Modified Rankin (Stroke Patients Only)       Balance Overall balance assessment: Needs assistance Sitting-balance support: Bilateral upper extremity supported;Feet supported Sitting balance-Leahy Scale: Fair     Standing balance support: Bilateral upper extremity supported Standing balance-Leahy Scale: Poor                              Cognition Arousal/Alertness: Awake/alert Behavior During Therapy: WFL for tasks assessed/performed Overall Cognitive Status: Within Functional Limits for tasks assessed                                        Exercises      General Comments        Pertinent Vitals/Pain Pain Assessment: 0-10 Pain Score: 6  Pain Location: hands and feet Pain Descriptors / Indicators: Grimacing;Sore;Tender Pain Intervention(s): Limited activity within patient's tolerance;Monitored during session    Home Living Family/patient expects to be discharged to:: Unsure Living Arrangements: Parent             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  Son can assist intermittently.   PT Goals (current goals can now be found in the care plan section) Acute Rehab PT Goals Patient Stated Goal: Get stronger PT  Goal Formulation: With patient Time For Goal Achievement: 07/06/17 Potential to Achieve Goals: Fair    Frequency    Min 3X/week      PT Plan Current plan remains appropriate    Co-evaluation              AM-PAC PT "6 Clicks" Daily Activity  Outcome Measure  Difficulty turning over in bed (including adjusting bedclothes, sheets and blankets)?: A Lot Difficulty moving from lying on back to sitting on the side of the bed? :  Unable Difficulty sitting down on and standing up from a chair with arms (e.g., wheelchair, bedside commode, etc,.)?: Unable Help needed moving to and from a bed to chair (including a wheelchair)?: A Lot Help needed walking in hospital room?: A Lot Help needed climbing 3-5 steps with a railing? : Total 6 Click Score: 9    End of Session Equipment Utilized During Treatment: Gait belt Activity Tolerance: Patient tolerated treatment well;Patient limited by fatigue;Patient limited by pain Patient left: with call bell/phone within reach;in bed;with bed alarm set Nurse Communication: Mobility status PT Visit Diagnosis: Unsteadiness on feet (R26.81);Muscle weakness (generalized) (M62.81);Difficulty in walking, not elsewhere classified (R26.2)     Time: [id number] PT Time Calculation (min) (ACUTE ONLY): 12 min  Charges:  $Gait Training: 8-22 mins $Therapeutic Activity: 8-22 mins                    G Codes:       Pg [phone]    Aden Youngman 06/22/2017, 12:50 PM

## 2017-06-22 NOTE — Evaluation (Signed)
Physical Therapy Evaluation Patient Details Name: Catherine Butler MRN: [id number] DOB: July 13, 1960 Today's Date: 06/22/2017   History of Present Illness  Pt admitted with c/o nausea/vomiting and weakness.  Pt with hx of Guillain Barre dx 10/18 as well as COPD and depression  Clinical Impression  Pt admitted as above and presenting with functional mobility limitations 2* generalized weakness and balance deficits.  Pt dx with Alene Mires in 10/18.  Pt entered SNF level rehab near [place], [place] and despite a difficult experience was able to progress to ambulating limited distances with RW or cane.  In the short time since returning home, pt has regressed to largely utilizing a WC for mobility.  Pt could greatly benefit from more intensive skilled PT/OT intervention to maximize IND and allow safe transition to home.  Follow Up Recommendations CIR    Equipment Recommendations  None recommended by PT    Recommendations for Other Services OT consult;Rehab consult     Precautions / Restrictions Precautions Precautions: Fall Restrictions Weight Bearing Restrictions: No      Mobility  Bed Mobility Overal bed mobility: Needs Assistance Bed Mobility: Supine to Sit;Sit to Supine     Supine to sit: Min assist;Mod assist Sit to supine: Min assist;Mod assist   General bed mobility comments: Pt utilizing bed rail and requiring assist to manage LEs  Transfers Overall transfer level: Needs assistance   Transfers: Sit to/from Stand Sit to Stand: Min assist;Mod assist;+2 safety/equipment Stand pivot transfers: Min assist;Mod assist;+2 safety/equipment       General transfer comment: cues for safe transition position and use of UEs to self assist.  Physical assist to bring wt up and fwd and to stabilize in standing  Ambulation/Gait Ambulation/Gait assistance: Min assist;Mod assist;+2 safety/equipment Ambulation Distance (Feet): 6 Feet Assistive device: Rolling walker (2 wheeled) Gait  Pattern/deviations: Step-through pattern;Decreased step length - right;Decreased step length - left;Shuffle;Antalgic;Staggering left;Staggering right;Wide base of support Gait velocity: decr Gait velocity interpretation: Below normal speed for age/gender General Gait Details: Increased time and concentration for foot placement.  Cues for posture and position from RW.  Physical assist for balance, support and RW management  Stairs            Wheelchair Mobility    Modified Rankin (Stroke Patients Only)       Balance Overall balance assessment: Needs assistance Sitting-balance support: Bilateral upper extremity supported;Feet supported Sitting balance-Leahy Scale: Fair     Standing balance support: Bilateral upper extremity supported Standing balance-Leahy Scale: Poor                               Pertinent Vitals/Pain Pain Assessment: 0-10 Pain Score: 6  Pain Location: hands and feet Pain Descriptors / Indicators: Grimacing;Sore;Tender Pain Intervention(s): Limited activity within patient's tolerance;Monitored during session;Patient requesting pain meds-RN notified    Home Living Family/patient expects to be discharged to:: Unsure Living Arrangements: Parent               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  Son can assist intermittently.     Hand Dominance   Dominant Hand: Right    Extremity/Trunk Assessment   Upper Extremity Assessment Upper  Extremity Assessment: Generalized weakness;RUE deficits/detail;LUE deficits/detail RUE Deficits / Details: Strength ~3+/5 but with lack of full grip 2* discomfort RUE: Unable to fully assess due to pain RUE Sensation:  history of peripheral neuropathy RUE Coordination: decreased fine motor LUE Deficits / Details: Strength ~ 3/5 but with lack of full grip 2* discomfort LUE: Unable to fully assess due to pain LUE Coordination: decreased fine motor    Lower Extremity Assessment Lower Extremity Assessment: Generalized weakness;RLE deficits/detail;LLE deficits/detail RLE Deficits / Details: ROM WFL with strength at hip 2+/5, knee 3-/5 and ankle 3/5 RLE Sensation: decreased proprioception;history of peripheral neuropathy RLE Coordination: decreased fine motor;decreased gross motor LLE Deficits / Details: ROM WFL with strength ~ as R LE LLE Sensation: history of peripheral neuropathy;decreased proprioception LLE Coordination: decreased fine motor;decreased gross motor    Cervical / Trunk Assessment Cervical / Trunk Assessment: Other exceptions Cervical / Trunk Exceptions: pelvic/trunk weakness affecting balance in sitting and standing  Communication   Communication: No difficulties  Cognition Arousal/Alertness: Awake/alert Behavior During Therapy: WFL for tasks assessed/performed Overall Cognitive Status: Within Functional Limits for tasks assessed                                        General Comments      Exercises     Assessment/Plan    PT Assessment Patient needs continued PT services  PT Problem List Decreased strength;Decreased activity tolerance;Decreased balance;Decreased mobility;Decreased coordination;Decreased knowledge of use of DME;Pain       PT Treatment Interventions DME instruction;Gait training;Functional mobility training;Therapeutic activities;Therapeutic exercise;Balance training;Patient/family education    PT Goals (Current goals can be found in the Care Plan section)  Acute Rehab PT Goals Patient Stated Goal: Get stronger PT Goal Formulation: With patient Time For Goal Achievement: 07/06/17 Potential to Achieve Goals: Fair    Frequency Min 3X/week    Barriers to discharge Decreased caregiver support;Inaccessible home environment Pt currenlty utilizing WC and has several stairs into home.  Pt living with elderly parents    Co-evaluation               AM-PAC PT "6 Clicks" Daily Activity  Outcome Measure Difficulty turning over in bed (including adjusting bedclothes, sheets and blankets)?: A Lot Difficulty moving from lying on back to sitting on the side of the bed? : Unable Difficulty sitting down on and standing up from a chair with arms (e.g., wheelchair, bedside commode, etc,.)?: Unable Help needed moving to and from a bed to chair (including a wheelchair)?: A Lot Help needed walking in hospital room?: A Lot Help needed climbing 3-5 steps with a railing? : Total 6 Click Score: 9    End of Session Equipment Utilized During Treatment: Gait belt Activity Tolerance: Patient tolerated treatment well;Patient limited by fatigue;Patient limited by pain Patient left: in chair;with call bell/phone within reach;with chair alarm set Nurse Communication: Mobility status PT Visit Diagnosis: Unsteadiness on feet (R26.81);Muscle weakness (generalized) (M62.81);Difficulty in walking, not elsewhere classified (R26.2)    Time: [id number] PT Time Calculation (min) (ACUTE ONLY): 28 min   Charges:   PT Evaluation $PT Eval Moderate Complexity: 1 Mod PT Treatments $Gait Training: 8-22 mins   PT G Codes:        Pg [phone]   Keneth Borg 06/22/2017, 11:47 AM

## 2017-06-22 NOTE — Progress Notes (Signed)
Patient Demographics:    Catherine Butler, is a 57 y.o. female, DOB - 10/14/1960, ZOX:[id number]  Admit date - 06/20/2017   Admitting Physician Delano Metzobert Schertz, MD  Outpatient Primary MD for the patient is Loletta SpecterGomez, Roger David, PA-C  LOS - 1   Chief Complaint  Patient presents with  . 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  Patient was found to be tachycardic and hypokalemic on presentation.  Patient is currently nonambulatory , has chronic lower extremity pain.  She currently lives at home with her parents. Patient was admitted for the management of dehydration, increased generalized weakness and hypokalemia.  1)Nausea and vomiting: Secondary to viral syndrome versus food poisoning-mostly resolved, advance diet,  2)FEN/Volume depletion/hypomagnesemia/hypokalemia: Overall improved, okay to replace magnesium, potassium is normalized continue IV fluids.   3)Chronic lower extremity weakness/Guillain-Barr syndrome: Patient has been subsequently disabled after Guillain-Barr syndrome which was diagnosed 03/2017.  She was treated with IVIG at that time.  She follows with neurology as an outpatient. PTA her parents were her primary  caregivers  4)Depression: Continue her home meds.  5)Disposition-generalized weakness/debility-neuromuscular deficits secondary to recent Guillain-Barr illness compounded by electrolyte disturbance in the setting of intractable emesis this admission-physical therapy and occupational therapy eval appreciated, recommended [facility] Rehab   DVT prophylaxis: Lovenox Code Status: Full Family Communication: None present on the bedside   Lab Results  Component Value Date   PLT 192 06/21/2017    Inpatient Medications  Scheduled Meds: . enoxaparin (LOVENOX) injection  40 mg Subcutaneous Q24H  . gabapentin  400 mg Oral QID  . mirtazapine  15 mg Oral QHS  . pantoprazole  80 mg Oral Daily  . QUEtiapine  400 mg Oral QHS   Continuous Infusions: . 0.9 % NaCl with KCl 20 mEq / L 125 mL/hr at 06/22/17 1557   PRN Meds:.acetaminophen **OR** acetaminophen, albuterol, HYDROmorphone (DILAUDID) injection, hydrOXYzine, ondansetron **OR** ondansetron (ZOFRAN) IV    Anti-infectives (From admission, onward)   None        Objective:   Vitals:   06/21/17 1345 06/21/17 2017 06/22/17 0446 06/22/17 1447  BP: (!) 142/99 137/89 (!) 118/96 (!) 137/98  Pulse: (!) 114 (!) 113 (!) 123 (!) 118  Resp:  20 20 20   Temp: (!) 97.4 F (36.3 C) 98.2 F (36.8 C) 98.4 F (36.9 C) 97.7 F (36.5 C)  TempSrc: Oral Oral Oral Oral  SpO2: 100% 100% 99% 100%  Weight:   68.9 kg (151 lb 14.4 oz)   Height:        Wt Readings from  Last 3 Encounters:  06/22/17 68.9 kg (151 lb 14.4 oz)  04/19/17 77 kg (169 lb 12.1 oz)  04/05/17 82 kg (180 lb 12.8 oz)     Intake/Output Summary (Last 24 hours) at 06/22/2017 1855 Last data filed at 06/22/2017 1600 Gross per 24 hour  Intake 3485 ml  Output 650 ml  Net 2835 ml     Physical Exam  Gen:- Awake Alert,  In no apparent distress  HEENT:- [place].AT, No sclera icterus Neck-Supple Neck,No JVD,.  Lungs-  CTAB  CV- S1, S2 normal Abd-  +ve B.Sounds, Abd Soft, No tenderness,     Extremity/Skin:- No  edema,    Neuro-neuromuscular weakness, no new focal deficits the patient is weaker than her baseline overall, unable to independently get out of bed or stand Psych-affect is flat, denies suicidal or homicidal ideation    Data Review:   Micro Results Recent Results (from the past 240 hour(s))  Culture, blood (routine x 2)     Status: None (Preliminary result)   Collection Time: 06/20/17  5:22 PM  Result Value Ref Range Status   Specimen Description BLOOD LEFT HAND  Final   Special Requests IN PEDIATRIC BOTTLE Blood Culture adequate volume  Final   Culture   Final    NO GROWTH 2 DAYS Performed at [facility], [address] [place], [place] [address]    Report Status PENDING  Incomplete  Culture, blood (routine x 2)     Status: None (Preliminary result)   Collection Time: 06/20/17  5:30 PM  Result Value Ref Range Status   Specimen Description BLOOD LEFT ANTECUBITAL  Final   Special Requests   Final    BOTTLES DRAWN AEROBIC AND ANAEROBIC Blood Culture adequate volume   Culture   Final    NO GROWTH 2 DAYS Performed at [facility], [address] [place], [place] [address]    Report Status PENDING  Incomplete  Urine culture     Status: None   Collection Time: 06/20/17  6:09 PM  Result Value Ref Range Status   Specimen Description URINE, CATHETERIZED  Final   Special Requests NONE  Final   Culture   Final    NO GROWTH Performed at [facility], [address] [place], [place] [address]    Report Status 06/22/2017 FINAL  Final    Radiology Reports Ct Abdomen Pelvis W Contrast  Result Date: 06/20/2017 CLINICAL DATA:  Nausea and vomiting x4 days. EXAM: CT ABDOMEN AND PELVIS WITH CONTRAST TECHNIQUE: Multidetector CT imaging of the abdomen and pelvis was performed using the standard protocol following bolus administration of intravenous contrast. CONTRAST:  ISOVUE-300 IOPAMIDOL (ISOVUE-300) INJECTION 61% COMPARISON:  03/26/2017  CT FINDINGS: Lower chest: Moderate-sized hiatal hernia. Normal sized heart without pericardial effusion. No active pulmonary disease. Minimal subsegmental atelectasis in the right lower lobe. Hepatobiliary: Hepatic steatosis. Nondistended gallbladder. No cholelithiasis or biliary dilatation. Pancreas: Atrophic pancreas without acute inflammation or space-occupying mass. No ductal dilatation. Scattered calcifications of the pancreatic gland consistent with stigmata of chronic pancreatitis. Spleen: Normal in size without focal abnormality. Adrenals/Urinary Tract: Adrenal glands are unremarkable. Kidneys are normal, without renal calculi, focal lesion, or hydronephrosis. Bladder is unremarkable. Stomach/Bowel: Nondistended stomach with normal small bowel rotation. Normal appendix. Large amount of fecal retention throughout the colon. Vascular/Lymphatic: Mild aortic atherosclerosis. No aneurysm or dissection. No lymphadenopathy. Reproductive: Atrophic uterus without adnexal mass. Other: No free air nor free fluid. Musculoskeletal: Chronic superior endplate compression of T11.  No acute osseous findings. IMPRESSION: 1. Increased fecal retention, query constipation. 2. Hepatic steatosis. 3. Stable moderate-sized hiatal hernia. 4. Stigmata of chronic pancreatitis with scattered pancreatic gland calcifications. 5. Aortic atherosclerosis. Electronically Signed   By: Tollie Eth M.D.   On: 06/20/2017 19:35   Dg Chest Port 1 View  Result Date: 06/20/2017 CLINICAL DATA:  Nausea and vomiting x 4 days - hx bladder pretension - Hx COPD - current everyday smoker EXAM: PORTABLE CHEST 1 VIEW COMPARISON:  CT 04/18/2017.  Chest radiograph 01/01/2017. FINDINGS: Hyperinflation. Remote left rib fractures. Midline trachea. Normal heart size. Moderate hiatal hernia. No pleural effusion or pneumothorax. Biapical pleural thickening. Clear lungs. IMPRESSION: No acute cardiopulmonary disease. Hiatal hernia. Electronically Signed   By: Jeronimo Greaves M.D.   On: 06/20/2017 18:05     CBC Recent Labs  Lab 06/20/17 1730 06/21/17 0627  WBC 6.5 4.7  HGB 14.3 10.3*  HCT 39.2 30.5*  PLT 267 192  MCV 88.3 90.8  MCH 32.2 30.7  MCHC 36.5* 33.8  RDW 12.5 12.8  LYMPHSABS 2.6  --   MONOABS 0.9  --   EOSABS 0.0  --   BASOSABS 0.1  --     Chemistries  Recent Labs  Lab 06/20/17 1730 06/20/17 1734 06/21/17 0627 06/22/17 0547  NA 129*  --  134* 135  K 2.7*  --  3.4* 4.1  CL 94*  --  110 111  CO2 21*  --  18* 18*  GLUCOSE 110*  --  88 81  BUN 9  --  6 <5*  CREATININE 0.49  --  0.40* 0.41*  CALCIUM 8.6*  --  7.2* 7.5*  MG  --  1.2*  --  1.5*  AST 28  --  20  --   ALT 16  --  14  --   ALKPHOS 143*  --  97  --   BILITOT 1.8*  --  1.3*  --    ------------------------------------------------------------------------------------------------------------------ No results for input(s): CHOL, HDL, LDLCALC, TRIG, CHOLHDL, LDLDIRECT in the last 72 hours.  Lab Results  Component Value Date   HGBA1C 4.8 04/13/2017   ------------------------------------------------------------------------------------------------------------------ No results for input(s): TSH, T4TOTAL, T3FREE, THYROIDAB in the last 72 hours.  Invalid input(s): FREET3 ------------------------------------------------------------------------------------------------------------------ No results for input(s): VITAMINB12, FOLATE, FERRITIN, TIBC, IRON, RETICCTPCT in the last 72 hours.  Coagulation profile Recent Labs  Lab 06/20/17 1730  INR 0.93    No results for input(s): DDIMER in the last 72 hours.  Cardiac Enzymes No results for input(s): CKMB, TROPONINI, MYOGLOBIN in the last 168 hours.  Invalid input(s): CK ------------------------------------------------------------------------------------------------------------------ No results found for: BNP   Shon Hale M.D on 06/22/2017 at 6:55 PM  Between 7am to 7pm - Pager - [phone]  After 7pm go to  [email or web address] - password [email or web address]  [facility] -  Office  [phone]   Voice Recognition Reubin Milan dictation system was used to create this note, attempts have been made to correct errors. Please contact the author with questions and/or clarifications.

## 2017-06-23 ENCOUNTER — Inpatient Hospital Stay (HOSPITAL_COMMUNITY)
Admission: RE | Admit: 2017-06-23 | Discharge: 2017-07-06 | DRG: 945 | Disposition: A | Discharge status: Home / Self Care | Payer: Medicaid Other | Source: Intra-hospital | Attending: Physical Medicine & Rehabilitation | Admitting: Physical Medicine & Rehabilitation

## 2017-06-23 ENCOUNTER — Inpatient Hospital Stay (HOSPITAL_COMMUNITY): Payer: Medicaid Other

## 2017-06-23 ENCOUNTER — Encounter (HOSPITAL_COMMUNITY): Payer: Self-pay | Admitting: Physical Medicine and Rehabilitation

## 2017-06-23 ENCOUNTER — Encounter (HOSPITAL_COMMUNITY): Payer: Self-pay | Admitting: *Deleted

## 2017-06-23 DIAGNOSIS — G629 Polyneuropathy, unspecified: Secondary | ICD-10-CM | POA: Diagnosis present

## 2017-06-23 DIAGNOSIS — D62 Acute posthemorrhagic anemia: Secondary | ICD-10-CM | POA: Diagnosis present

## 2017-06-23 DIAGNOSIS — E871 Hypo-osmolality and hyponatremia: Secondary | ICD-10-CM | POA: Diagnosis present

## 2017-06-23 DIAGNOSIS — Z800 Family history of malignant neoplasm of digestive organs: Secondary | ICD-10-CM

## 2017-06-23 DIAGNOSIS — F17210 Nicotine dependence, cigarettes, uncomplicated: Secondary | ICD-10-CM | POA: Diagnosis present

## 2017-06-23 DIAGNOSIS — K5900 Constipation, unspecified: Secondary | ICD-10-CM | POA: Diagnosis present

## 2017-06-23 DIAGNOSIS — G650 Sequelae of Guillain-Barre syndrome: Secondary | ICD-10-CM | POA: Diagnosis present

## 2017-06-23 DIAGNOSIS — E876 Hypokalemia: Secondary | ICD-10-CM | POA: Diagnosis not present

## 2017-06-23 DIAGNOSIS — R630 Anorexia: Secondary | ICD-10-CM | POA: Diagnosis present

## 2017-06-23 DIAGNOSIS — K5901 Slow transit constipation: Secondary | ICD-10-CM

## 2017-06-23 DIAGNOSIS — N390 Urinary tract infection, site not specified: Secondary | ICD-10-CM

## 2017-06-23 DIAGNOSIS — Z8249 Family history of ischemic heart disease and other diseases of the circulatory system: Secondary | ICD-10-CM | POA: Diagnosis not present

## 2017-06-23 DIAGNOSIS — F419 Anxiety disorder, unspecified: Secondary | ICD-10-CM | POA: Diagnosis present

## 2017-06-23 DIAGNOSIS — I10 Essential (primary) hypertension: Secondary | ICD-10-CM | POA: Diagnosis present

## 2017-06-23 DIAGNOSIS — Z993 Dependence on wheelchair: Secondary | ICD-10-CM

## 2017-06-23 DIAGNOSIS — K861 Other chronic pancreatitis: Secondary | ICD-10-CM | POA: Diagnosis present

## 2017-06-23 DIAGNOSIS — R5381 Other malaise: Secondary | ICD-10-CM | POA: Diagnosis present

## 2017-06-23 DIAGNOSIS — R112 Nausea with vomiting, unspecified: Secondary | ICD-10-CM

## 2017-06-23 DIAGNOSIS — Z888 Allergy status to other drugs, medicaments and biological substances status: Secondary | ICD-10-CM | POA: Diagnosis not present

## 2017-06-23 DIAGNOSIS — K219 Gastro-esophageal reflux disease without esophagitis: Secondary | ICD-10-CM | POA: Diagnosis present

## 2017-06-23 DIAGNOSIS — K760 Fatty (change of) liver, not elsewhere classified: Secondary | ICD-10-CM | POA: Diagnosis present

## 2017-06-23 DIAGNOSIS — R270 Ataxia, unspecified: Secondary | ICD-10-CM | POA: Diagnosis present

## 2017-06-23 DIAGNOSIS — M6281 Muscle weakness (generalized): Secondary | ICD-10-CM | POA: Diagnosis present

## 2017-06-23 DIAGNOSIS — J449 Chronic obstructive pulmonary disease, unspecified: Secondary | ICD-10-CM | POA: Diagnosis present

## 2017-06-23 DIAGNOSIS — G610 Guillain-Barre syndrome: Secondary | ICD-10-CM

## 2017-06-23 DIAGNOSIS — R000 Tachycardia, unspecified: Secondary | ICD-10-CM | POA: Diagnosis present

## 2017-06-23 DIAGNOSIS — Z87898 Personal history of other specified conditions: Secondary | ICD-10-CM

## 2017-06-23 DIAGNOSIS — F329 Major depressive disorder, single episode, unspecified: Secondary | ICD-10-CM

## 2017-06-23 DIAGNOSIS — G8250 Quadriplegia, unspecified: Secondary | ICD-10-CM | POA: Diagnosis not present

## 2017-06-23 DIAGNOSIS — Z833 Family history of diabetes mellitus: Secondary | ICD-10-CM

## 2017-06-23 DIAGNOSIS — Z79899 Other long term (current) drug therapy: Secondary | ICD-10-CM

## 2017-06-23 DIAGNOSIS — K5909 Other constipation: Secondary | ICD-10-CM | POA: Diagnosis present

## 2017-06-23 DIAGNOSIS — F411 Generalized anxiety disorder: Secondary | ICD-10-CM

## 2017-06-23 DIAGNOSIS — G990 Autonomic neuropathy in diseases classified elsewhere: Secondary | ICD-10-CM | POA: Diagnosis not present

## 2017-06-23 DIAGNOSIS — Z915 Personal history of self-harm: Secondary | ICD-10-CM

## 2017-06-23 LAB — URINALYSIS, ROUTINE W REFLEX MICROSCOPIC
Bilirubin Urine: NEGATIVE
Glucose, UA: NEGATIVE mg/dL
HGB URINE DIPSTICK: NEGATIVE
Ketones, ur: NEGATIVE mg/dL
NITRITE: NEGATIVE
PH: 6 (ref 5.0–8.0)
Protein, ur: NEGATIVE mg/dL
SPECIFIC GRAVITY, URINE: 1.004 — AB (ref 1.005–1.030)

## 2017-06-23 MED ORDER — GUAIFENESIN-DM 100-10 MG/5ML PO SYRP: 5.0000 mL | ORAL_SOLUTION | Freq: Four times a day (QID) | ORAL | Status: DC | PRN

## 2017-06-23 MED ORDER — POLYETHYLENE GLYCOL 3350 17 G PO PACK: 17.0000 g | PACK | Freq: Every day | ORAL | Status: DC | PRN

## 2017-06-23 MED ORDER — HYDROCODONE-ACETAMINOPHEN 5-325 MG PO TABS: 1.0000 | ORAL_TABLET | ORAL | Status: DC | PRN

## 2017-06-23 MED ORDER — ALBUTEROL SULFATE (2.5 MG/3ML) 0.083% IN NEBU
2.5000 mg | INHALATION_SOLUTION | Freq: Four times a day (QID) | RESPIRATORY_TRACT | Status: DC | PRN
  Administered 2017-06-28 – 2017-06-29 (×2): 2.5 mg via RESPIRATORY_TRACT
  Filled 2017-06-23 (×2): qty 3

## 2017-06-23 MED ORDER — HYDROXYZINE HCL 25 MG PO TABS
25.0000 mg | ORAL_TABLET | Freq: Four times a day (QID) | ORAL | Status: DC | PRN
  Administered 2017-06-27 – 2017-07-04 (×5): 25 mg via ORAL
  Filled 2017-06-23 (×5): qty 1

## 2017-06-23 MED ORDER — ONDANSETRON HCL 4 MG PO TABS: 4.0000 mg | ORAL_TABLET | Freq: Four times a day (QID) | ORAL | 0 refills | Status: DC | PRN

## 2017-06-23 MED ORDER — PROCHLORPERAZINE 25 MG RE SUPP: 12.5000 mg | Freq: Four times a day (QID) | RECTAL | Status: DC | PRN

## 2017-06-23 MED ORDER — TRAMADOL HCL 50 MG PO TABS
50.0000 mg | ORAL_TABLET | Freq: Four times a day (QID) | ORAL | Status: DC | PRN
  Administered 2017-06-23 – 2017-07-06 (×13): 50 mg via ORAL
  Filled 2017-06-23 (×13): qty 1

## 2017-06-23 MED ORDER — TRAZODONE HCL 50 MG PO TABS: 25.0000 mg | ORAL_TABLET | Freq: Every evening | ORAL | Status: DC | PRN

## 2017-06-23 MED ORDER — QUETIAPINE FUMARATE 100 MG PO TABS
400.0000 mg | ORAL_TABLET | Freq: Every day | ORAL | Status: DC
  Administered 2017-06-23 – 2017-07-05 (×13): 400 mg via ORAL
  Filled 2017-06-23 (×15): qty 4

## 2017-06-23 MED ORDER — MIRTAZAPINE 15 MG PO TABS
15.0000 mg | ORAL_TABLET | Freq: Every day | ORAL | Status: DC
  Administered 2017-06-23 – 2017-07-05 (×13): 15 mg via ORAL
  Filled 2017-06-23 (×13): qty 1

## 2017-06-23 MED ORDER — HYDROCODONE-ACETAMINOPHEN 5-325 MG PO TABS: 1.0000 | ORAL_TABLET | ORAL | 0 refills | Status: DC | PRN

## 2017-06-23 MED ORDER — FLEET ENEMA 7-19 GM/118ML RE ENEM: 1.0000 | ENEMA | Freq: Once | RECTAL | Status: DC | PRN

## 2017-06-23 MED ORDER — METOPROLOL TARTRATE 25 MG PO TABS
25.0000 mg | ORAL_TABLET | Freq: Once | ORAL | Status: AC
  Administered 2017-06-23: 25 mg via ORAL
  Filled 2017-06-23: qty 1

## 2017-06-23 MED ORDER — LIDOCAINE HCL 2 % EX GEL: CUTANEOUS | Status: DC | PRN

## 2017-06-23 MED ORDER — HYDROCODONE-ACETAMINOPHEN 5-325 MG PO TABS
1.0000 | ORAL_TABLET | Freq: Four times a day (QID) | ORAL | Status: DC | PRN
  Administered 2017-06-23 – 2017-07-04 (×31): 1 via ORAL
  Filled 2017-06-23 (×32): qty 1

## 2017-06-23 MED ORDER — PROCHLORPERAZINE EDISYLATE 5 MG/ML IJ SOLN
5.0000 mg | Freq: Four times a day (QID) | INTRAMUSCULAR | Status: DC | PRN
  Administered 2017-06-28: 10 mg via INTRAMUSCULAR
  Filled 2017-06-23: qty 2

## 2017-06-23 MED ORDER — ALUM & MAG HYDROXIDE-SIMETH 200-200-20 MG/5ML PO SUSP: 30.0000 mL | ORAL | Status: DC | PRN

## 2017-06-23 MED ORDER — TRAZODONE HCL 50 MG PO TABS
50.0000 mg | ORAL_TABLET | Freq: Every evening | ORAL | Status: DC | PRN
  Administered 2017-06-23 – 2017-07-03 (×8): 50 mg via ORAL
  Filled 2017-06-23 (×10): qty 1

## 2017-06-23 MED ORDER — METOPROLOL TARTRATE 25 MG PO TABS: 12.5000 mg | ORAL_TABLET | Freq: Two times a day (BID) | ORAL | 11 refills | Status: DC

## 2017-06-23 MED ORDER — METOPROLOL TARTRATE 25 MG PO TABS: 25.0000 mg | ORAL_TABLET | Freq: Two times a day (BID) | ORAL | 11 refills | Status: DC

## 2017-06-23 MED ORDER — METHOCARBAMOL 500 MG PO TABS
500.0000 mg | ORAL_TABLET | Freq: Four times a day (QID) | ORAL | Status: DC | PRN
  Administered 2017-06-23 – 2017-07-03 (×24): 500 mg via ORAL
  Filled 2017-06-23 (×27): qty 1

## 2017-06-23 MED ORDER — GABAPENTIN 400 MG PO CAPS
400.0000 mg | ORAL_CAPSULE | Freq: Four times a day (QID) | ORAL | Status: DC
  Administered 2017-06-23 – 2017-06-25 (×6): 400 mg via ORAL
  Filled 2017-06-23 (×8): qty 1

## 2017-06-23 MED ORDER — PANTOPRAZOLE SODIUM 40 MG PO TBEC
80.0000 mg | DELAYED_RELEASE_TABLET | Freq: Every day | ORAL | Status: DC
  Administered 2017-06-24 – 2017-07-05 (×12): 80 mg via ORAL
  Filled 2017-06-23 (×12): qty 2

## 2017-06-23 MED ORDER — BISACODYL 10 MG RE SUPP
10.0000 mg | Freq: Every day | RECTAL | Status: DC | PRN
  Administered 2017-06-25: 10 mg via RECTAL
  Filled 2017-06-23: qty 1

## 2017-06-23 MED ORDER — PROCHLORPERAZINE MALEATE 5 MG PO TABS
5.0000 mg | ORAL_TABLET | Freq: Four times a day (QID) | ORAL | Status: DC | PRN
  Administered 2017-06-23 – 2017-07-03 (×2): 5 mg via ORAL
  Administered 2017-07-03 – 2017-07-05 (×3): 10 mg via ORAL
  Filled 2017-06-23: qty 2
  Filled 2017-06-23: qty 1
  Filled 2017-06-23 (×2): qty 2
  Filled 2017-06-23: qty 1
  Filled 2017-06-23: qty 2

## 2017-06-23 MED ORDER — POLYETHYLENE GLYCOL 3350 17 G PO PACK: 17.0000 g | PACK | Freq: Every day | ORAL | Status: DC

## 2017-06-23 MED ORDER — ACETAMINOPHEN 325 MG PO TABS
325.0000 mg | ORAL_TABLET | ORAL | Status: DC | PRN
  Administered 2017-06-28 – 2017-06-29 (×2): 650 mg via ORAL
  Filled 2017-06-23 (×4): qty 2

## 2017-06-23 MED ORDER — POLYETHYLENE GLYCOL 3350 17 G PO PACK
17.0000 g | PACK | Freq: Every day | ORAL | Status: DC
  Administered 2017-06-23: 17 g via ORAL
  Filled 2017-06-23 (×2): qty 1

## 2017-06-23 MED ORDER — ENSURE ENLIVE PO LIQD
237.0000 mL | Freq: Two times a day (BID) | ORAL | Status: DC
  Administered 2017-06-23 – 2017-06-30 (×13): 237 mL via ORAL

## 2017-06-23 MED ORDER — ENOXAPARIN SODIUM 40 MG/0.4ML ~~LOC~~ SOLN
40.0000 mg | SUBCUTANEOUS | Status: DC
  Administered 2017-06-24 – 2017-07-06 (×13): 40 mg via SUBCUTANEOUS
  Filled 2017-06-23 (×13): qty 0.4

## 2017-06-23 NOTE — Progress Notes (Signed)
Occupational Therapy Treatment Patient Details Name: Catherine Butler MRN: [id number] DOB: 05/13/1961 Today's Date: 06/23/2017    History of present illness Pt admitted with c/o nausea/vomiting and weakness.  Pt with hx of Guillain Barre dx 10/18 as well as COPD and depression   OT comments  Pt very distracted by itching; it was not time for more medication. Also had shooting pains down arms.  Worked on sit to stand for adls as well as SPT.  Pt had already washed this am  Follow Up Recommendations  CIR    Equipment Recommendations  None recommended by OT    Recommendations for Other Services      Precautions / Restrictions Precautions Precautions: Fall Restrictions Weight Bearing Restrictions: No       Mobility Bed Mobility         Supine to sit: Min assist;HOB elevated     General bed mobility comments: use of bed rail; light assist for trunk sitting up and assist for legs for back to bed  Transfers       Sit to Stand: Min assist Stand pivot transfers: Min assist       General transfer comment: therapist stood in front of pt for first sit to stand and transfer. Then used RW for second sit to stand.  Decreased postural stability/shaking/ataxic movements noted after 20 seconds    Balance     Sitting balance-Leahy Scale: Fair       Standing balance-Leahy Scale: Poor                             ADL either performed or assessed with clinical judgement   ADL                                         General ADL Comments: pt very itchy this session and distracted by this.  Worked on sit to stand x2  with min A.  Pt gets shaky/ataxic when standing longer than 30 seconds.  She also is fearful of falling as she has had many falls, and she rushes during pivot to sit.  Cues given for safety.  pt is using regular utensils now, and uses both hands to hold cup; weakness noted     Vision       Perception     Praxis      Cognition  Arousal/Alertness: Awake/alert Behavior During Therapy: Flat affect Overall Cognitive Status: Within Functional Limits for tasks assessed                                          Exercises Exercises: (encouraged use of squeeze ball (in basin))   Shoulder Instructions       General Comments      Pertinent Vitals/ Pain       Pain Assessment: 0-10 Pain Score: 6  Pain Location: hands and feet Pain Descriptors / Indicators: Grimacing;Sore;Tender(itchy)  Home Living                                          Prior Functioning/Environment              Frequency  Min 3X/week        Progress Toward Goals  OT Goals(current goals can now be found in the care plan section)  Progress towards OT goals: Progressing toward goals     Plan      Co-evaluation                 AM-PAC PT "6 Clicks" Daily Activity     Outcome Measure   Help from another person eating meals?: A Little Help from another person taking care of personal grooming?: A Little Help from another person toileting, which includes using toliet, bedpan, or urinal?: A Lot Help from another person bathing (including washing, rinsing, drying)?: A Little Help from another person to put on and taking off regular upper body clothing?: A Little Help from another person to put on and taking off regular lower body clothing?: A Lot 6 Click Score: 16    End of Session    OT Visit Diagnosis: Unsteadiness on feet (R26.81);Muscle weakness (generalized) (M62.81);Pain Pain - part of body: Hand;Ankle and joints of foot   Activity Tolerance     Patient Left in chair;with call bell/phone within reach;with chair alarm set   Nurse Communication          Time: [id number] OT Time Calculation (min): 32 min  Charges: OT General Charges $OT Visit: 1 Visit OT Treatments $Therapeutic Activity: 23-37 mins  Marica Otter,  OTR/L [phone] 06/23/2017   Howard Patton 06/23/2017, 11:13 AM

## 2017-06-23 NOTE — Discharge Summary (Signed)
Catherine Butler, is a 57 y.o. female  DOB 01/27/1961  MRN [id number].  Admission date:  06/20/2017  Admitting Physician  Delano Metzobert Schertz, MD  Discharge Date:  06/23/2017   Primary MD  Loletta SpecterGomez, Roger David, PA-C  Recommendations for primary care physician for things to follow:  Discharge to inpatient rehab Solid food as tolerated Recheck BMP and serum  Magnesium on 06/26/17  Admission Diagnosis  Dehydration [E86.0] Hypokalemia [E87.6] Intractable vomiting with nausea, unspecified vomiting type [R11.2]   Discharge Diagnosis  Dehydration [E86.0] Hypokalemia [E87.6] Intractable vomiting with nausea, unspecified vomiting type [R11.2]    Principal Problem:   Nausea & vomiting Active Problems:   Depression   Bilateral leg weakness - chronic, after Guillian Barre episode   Essential hypertension   Hypokalemia   Volume depletion   Sinus tachycardia   Nausea and vomiting      Past Medical History:  Diagnosis Date  . Anxiety   . Arthritis   . Asthma   . COPD (chronic obstructive pulmonary disease) (HCC)   . Depression   . H/O alcohol abuse   . Hypertension   . Reflux   . Suicide attempt by multiple drug overdose (HCC) 2014    Past Surgical History:  Procedure Laterality Date  . LAPAROSCOPY    . laporscopy surgery for ovarian cyst  20 years ago  . TONSILLECTOMY         HPI  from the history and physical done on the day of admission:   Chief Complaint: Nausea, vomiting, tachycardia  HPI: Catherine Butler is a 57 y.o. 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 Now living w/ her parents and is nonambulatory, and has chronic pain in the L leg.  Comes to ED today w/ 4 day hx of nausea and vomiting, subj fever and hard shaking chills. No diarrhea.  In ED HR 130's and low K+ on labs.  ASked to see for admission.   [place] lives in [place] w/ her  parents and has one son.  She is unable to walk and has chronic pain in L leg after GBS.  Finished 12th grade and worked as a Conservation officer, naturecashier.    She denies any CP, prod cough, abd pain, joint pain, back pain.   Chart: -dec 2016 > RLL CAP, acute resp failure, AKI resolved, hypokalemia treated, anemia normocytic, COPD/ asthma, tobacco use, depression -jul 2018 > abd pain and fever, CT abd negative, presumed enteritis, rx'd w/ some abx, dcd' on augmentin po.  LFT"s up , improved w time . Hepatitis panel neg.  COPD, depression, hypokalemia.  -oct 2018 > bilat UE/ LE weakness w/ urinary retention and fecal incontinence, suspected Guillian-Barre syndrome per neurology. Received IVIG for 5 days and was to f/u w/ [facility] in OP setting.          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 Patient was found to be tachycardic and hypokalemic on presentation. Patient is currently nonambulatory ,has chronic lower extremity pain. She currently lives at home with her parents. Patient was admitted for the management of dehydration,increased generalized weakness and hypokalemia.   Plan:- 1)Nausea and vomiting:Secondary to viral syndrome versus food poisoning-mostly resolved, tolerating advanced diet well,  2)FEN/Volume depletion/hypomagnesemia/hypokalemia:  due to Gi losses, replaced magnesium and potassium, repeat BMP and Magnesium on 06/26/17  3)Chronic lower extremity weakness/Guillain-Barr syndrome: Patient has been subsequently disabled after Guillain-Barr syndrome which was diagnosed 03/2017.She was treated with IVIG at that time. She follows with neurology as an outpatient. PTA her parents were her primary caregivers,  PT eval noted, transfer to CIR  4)Depression:  slightly anxious/apprehesive about being discharged today, c/o Seroquel, Remeron   5)Disposition- Generalized Weakness/Debility-  Neuromuscular deficits secondary to recent Guillain-Barr illness compounded by electrolyte disturbance in the setting of intractable emesis this admission-physical therapy and occupational therapy eval appreciated,  Transfer to  [facility] on 06/23/17.   6)Elevated BP- Metoprolol 12.5 mg bid  Discharge Condition: stable  Consults obtained - PT/OT/ inpatient Rehab  Diet and Activity recommendation:  As advised  Discharge Instructions    Discharge Instructions    Call MD for:  difficulty breathing, headache or visual disturbances   Complete by:  As directed    Call MD for:  persistant dizziness or light-headedness   Complete by:  As directed    Call MD for:  persistant nausea and vomiting   Complete by:  As directed    Call MD for:  redness, tenderness, or signs of infection (pain, swelling, redness, odor or green/yellow discharge around incision site)   Complete by:  As directed    Call MD for:  severe uncontrolled pain   Complete by:  As directed    Call MD for:  temperature >100.4   Complete by:  As directed    Diet - low sodium heart healthy   Complete by:  As directed    Discharge instructions   Complete by:  As directed    Discharge to inpatient rehab Solid food as tolerated Recheck BMP and serum  Magnesium on 06/26/17   Increase activity slowly   Complete by:  As directed         Discharge Medications     Allergies as of 06/23/2017      Reactions   Lipitor [atorvastatin] Other (See Comments)   "really bad leg pain"      Medication List    TAKE these medications   acetaminophen 500 MG tablet Commonly known as:  TYLENOL Take 1,000 mg by mouth 2 (two) times daily as needed for headache.   albuterol 108 (90 Base) MCG/ACT inhaler Commonly known as:  PROVENTIL HFA;VENTOLIN HFA Inhale 2  puffs into the lungs every 6 (six) hours as needed for wheezing.   dimenhyDRINATE 50 MG tablet Commonly known as:  DRAMAMINE Take 50 mg by mouth every 8 (eight) hours as needed for nausea.   gabapentin 400 MG capsule Commonly known as:  NEURONTIN Take 400 mg by mouth 4 (four) times daily.   HYDROcodone-acetaminophen 5-325 MG tablet Commonly known as:  NORCO/VICODIN Take 1 tablet by mouth every 4 (four) hours as needed for moderate pain.   hydrOXYzine 25 MG tablet Commonly known as:  ATARAX/VISTARIL Take 25 mg by mouth every 6 (six) hours as needed  for anxiety.   metoprolol tartrate 25 MG tablet Commonly known as:  LOPRESSOR Take 0.5 tablets (12.5 mg total) by mouth 2 (two) times daily.   mirtazapine 15 MG tablet Commonly known as:  REMERON Take 15 mg by mouth at bedtime.   omeprazole 40 MG capsule Commonly known as:  PRILOSEC Take 1 capsule (40 mg total) by mouth 2 (two) times daily.   ondansetron 4 MG tablet Commonly known as:  ZOFRAN Take 1 tablet (4 mg total) by mouth every 6 (six) hours as needed for nausea.   QUEtiapine 400 MG tablet Commonly known as:  SEROQUEL Take 400 mg by mouth at bedtime.       Major procedures and Radiology Reports - PLEASE review detailed and final reports for all details, in brief -   Ct Abdomen Pelvis W Contrast  Result Date: 06/20/2017 CLINICAL DATA:  Nausea and vomiting x4 days. EXAM: CT ABDOMEN AND PELVIS WITH CONTRAST TECHNIQUE: Multidetector CT imaging of the abdomen and pelvis was performed using the standard protocol following bolus administration of intravenous contrast. CONTRAST:  ISOVUE-300 IOPAMIDOL (ISOVUE-300) INJECTION 61% COMPARISON:  03/26/2017 CT FINDINGS: Lower chest: Moderate-sized hiatal hernia. Normal sized heart without pericardial effusion. No active pulmonary disease. Minimal subsegmental atelectasis in the right lower lobe. Hepatobiliary: Hepatic steatosis. Nondistended gallbladder. No cholelithiasis or  biliary dilatation. Pancreas: Atrophic pancreas without acute inflammation or space-occupying mass. No ductal dilatation. Scattered calcifications of the pancreatic gland consistent with stigmata of chronic pancreatitis. Spleen: Normal in size without focal abnormality. Adrenals/Urinary Tract: Adrenal glands are unremarkable. Kidneys are normal, without renal calculi, focal lesion, or hydronephrosis. Bladder is unremarkable. Stomach/Bowel: Nondistended stomach with normal small bowel rotation. Normal appendix. Large amount of fecal retention throughout the colon. Vascular/Lymphatic: Mild aortic atherosclerosis. No aneurysm or dissection. No lymphadenopathy. Reproductive: Atrophic uterus without adnexal mass. Other: No free air nor free fluid. Musculoskeletal: Chronic superior endplate compression of T11. No acute osseous findings. IMPRESSION: 1. Increased fecal retention, query constipation. 2. Hepatic steatosis. 3. Stable moderate-sized hiatal hernia. 4. Stigmata of chronic pancreatitis with scattered pancreatic gland calcifications. 5. Aortic atherosclerosis. Electronically Signed   By: Tollie Eth M.D.   On: 06/20/2017 19:35   Dg Chest Port 1 View  Result Date: 06/20/2017 CLINICAL DATA:  Nausea and vomiting x 4 days - hx bladder pretension - Hx COPD - current everyday smoker EXAM: PORTABLE CHEST 1 VIEW COMPARISON:  CT 04/18/2017.  Chest radiograph 01/01/2017. FINDINGS: Hyperinflation. Remote left rib fractures. Midline trachea. Normal heart size. Moderate hiatal hernia. No pleural effusion or pneumothorax. Biapical pleural thickening. Clear lungs. IMPRESSION: No acute cardiopulmonary disease. Hiatal hernia. Electronically Signed   By: Jeronimo Greaves M.D.   On: 06/20/2017 18:05    Micro Results    Recent Results (from the past 240 hour(s))  Culture, blood (routine x 2)     Status: None (Preliminary result)   Collection Time: 06/20/17  5:22 PM  Result Value Ref Range Status   Specimen Description BLOOD  LEFT HAND  Final   Special Requests IN PEDIATRIC BOTTLE Blood Culture adequate volume  Final   Culture   Final    NO GROWTH 3 DAYS Performed at [facility], [address], [place], [place] [address]    Report Status PENDING  Incomplete  Culture, blood (routine x 2)     Status: None (Preliminary result)   Collection Time: 06/20/17  5:30 PM  Result Value Ref Range Status   Specimen Description BLOOD LEFT ANTECUBITAL  Final  Special Requests   Final    BOTTLES DRAWN AEROBIC AND ANAEROBIC Blood Culture adequate volume   Culture   Final    NO GROWTH 3 DAYS Performed at [facility], [address], [place], [place] [address]    Report Status PENDING  Incomplete  Urine culture     Status: None   Collection Time: 06/20/17  6:09 PM  Result Value Ref Range Status   Specimen Description URINE, CATHETERIZED  Final   Special Requests NONE  Final   Culture   Final    NO GROWTH Performed at [facility], [address] [place], [place] [address]    Report Status 06/22/2017 FINAL  Final       Today   Subjective    Catherine Butler today has no new complaints, tolerating diet well, no f/c, generalized weakness persist       slightly anxious/apprehesive about being discharged today   Patient has been seen and examined prior to discharge   Objective   Blood pressure (!) 137/102, pulse (!) 125, temperature 99.2 F (37.3 C), temperature source Oral, resp. rate 19, height 5\' 3"  (1.6 m), weight 68.9 kg (151 lb 14.4 oz), last menstrual period 02/22/2006, SpO2 100 %.   Intake/Output Summary (Last 24 hours) at 06/23/2017 1442 Last data filed at 06/23/2017 1315 Gross per 24 hour  Intake 1490 ml  Output 1000 ml  Net 490 ml    Exam Gen:- Awake  In no apparent distress  HEENT:- [place].AT,   Neck-Supple Neck,No JVD,  Lungs- mostly clear  CV- S1, S2 normal Abd-  +ve B.Sounds, Abd Soft, No tenderness,    Extremity/Skin:- Intact peripheral pulses   Neuro- generalized weakness  w/o NEW focal deficits Psych- slightly anxious/apprehesive about being discharged today   Data Review   CBC w Diff:  Lab Results  Component Value Date   WBC 4.7 06/21/2017   HGB 10.3 (L) 06/21/2017   HGB 13.5 01/18/2017   HCT 30.5 (L) 06/21/2017   HCT 40.3 01/18/2017   PLT 192 06/21/2017   PLT 332 01/18/2017   LYMPHOPCT 39 06/20/2017   MONOPCT 14 06/20/2017   EOSPCT 0 06/20/2017   BASOPCT 1 06/20/2017    CMP:  Lab Results  Component Value Date   NA 135 06/22/2017   NA 138 01/18/2017   K 4.1 06/22/2017   CL 111 06/22/2017   CO2 18 (L) 06/22/2017   BUN <5 (L) 06/22/2017   BUN 5 (L) 01/18/2017   CREATININE 0.41 (L) 06/22/2017   PROT 4.9 (L) 06/21/2017   PROT 6.7 01/18/2017   ALBUMIN 1.9 (L) 06/21/2017   ALBUMIN 3.6 01/18/2017   BILITOT 1.3 (H) 06/21/2017   BILITOT 0.4 01/18/2017   ALKPHOS 97 06/21/2017   AST 20 06/21/2017   ALT 14 06/21/2017  .   Total Discharge time is about 33 minutes  Shon Hale M.D on 06/23/2017 at 2:42 PM  [facility]   Office  [phone]  Voice Recognition Reubin Milan dictation system was used to create this note, attempts have been made to correct errors. Please contact the author with questions and/or clarifications.

## 2017-06-23 NOTE — Progress Notes (Signed)
Charlett Blake, MD  Physician  Physical Medicine and Rehabilitation  PMR Pre-admission  Signed  Date of Service:  06/23/2017 2:03 PM       Related encounter: ED to Hosp-Admission (Discharged) from 06/20/2017 in [facility] 5 EAST MEDICAL UNIT      Signed            []Hide copied text  []Hover for details     [employer] PMR Admission Coordinator Pre-Admission Assessment  Patient: Catherine Butler is an 57 y.o., female MRN: [id number] DOB: [birth year] Height: 5' 3" (160 cm) Weight: 68.9 kg (151 lb 14.4 oz)  Insurance Information Self pay - no insurance             Medicaid Application Date: 01/22/32      Case Manager:   Disability Application Date: Pending      Case Worker:    Emergency Contact Information        Contact Information    Name Relation Home Work Mobile   [place] Son   [phone]   [employer] Mother [phone]        Current Medical History  Patient Admitting Diagnosis: Debility, recent GBS  History of Present Illness: A 57 y.o.femalewith history of anxiety disorder, depression,alcohol abuse,COPD, admission [facility] 10/22-10/31/18 with back pain, progressive BUE/BLE paraesthesias, difficulty with walking, bowel incontinence and urinary retention. Also with reports of recent flu vaccine and was treated with IVIG due to suspicion of GBS and discharged to SNF with recommendations of EMG by neurology on outpatient basis. She was discharged to home recently but readmitted to [facility] on 06/20/17 with nausea/vomiting X four day and weakness. CT abdomen done revealing hepatic steatosis, stigmata of chronic pancreatitis with pancreatitic calcifications, increased fecal retention and stable moderate HH. She was noted to have urinary retention with bladder volume 600cc  Electrolyte abnormality felt to be due to viral syndrome v/s food poisoning and treated with IVF. UDS negative. UA negative.Therapy evaluations  done showing diffuse weakness, poor safety and neuropathy affecting functional status. CIR recommended for aggressive rehab program.   Patient's medical record from [facility] has been reviewed by the rehabilitation admission coordinator and physician.  Past Medical History  Past Medical History:  Diagnosis Date  . Anxiety   . Arthritis   . Asthma   . COPD (chronic obstructive pulmonary disease) ([place])   . Depression   . H/O alcohol abuse   . Hypertension   . Reflux   . Suicide attempt by multiple drug overdose ([place]) 2014    Family History   family history includes Allergic rhinitis in her paternal grandfather; Breast cancer (age of onset: 68) in her mother; Diabetes in her father; Hypertension in her brother; Hypertension (age of onset: 10) in her father.  Prior Rehab/Hospitalizations Has the patient had major surgery during 100 days prior to admission? No.Went to [facility] for a month in Nov/Dec 2018 after diagnosis of GBS.  Current Medications See MAR from [facility]  Patients Current Diet:  Regular diet, thin liquids  Precautions / Restrictions Precautions Precautions: Fall Restrictions Weight Bearing Restrictions: No   Has the patient had 2 or more falls or a fall with injury in the past year?Yes.  Patient reports 8-10 falls over the last year  Prior Activity Level Limited Community (1-2x/wk): Went out 1 X a week  Prior Functional Level Self Care: Did the patient need help bathing, dressing, using the toilet or eating?  Needed some help  Indoor Mobility:  Did the patient need assistance with walking from room to room (with or without device)? Needed some help  Stairs: Did the patient need assistance with internal or external stairs (with or without device)? Needed some help  Functional Cognition: Did the patient need help planning regular tasks such as shopping or remembering to take medications? Independent  Home  Assistive Devices / Equipment Home Assistive Devices/Equipment: Bedside commode/3-in-1, Wheelchair  Prior Device Use: Indicate devices/aids used by the patient prior to current illness, exacerbation or injury? Manual wheelchair and Walker   Prior Functional Level Current Functional Level  Bed Mobility  Independent  Mod assist   Transfers  Other(supervision)  Mod assist   Mobility - Walk/Wheelchair  Mod Independent(Wheel chair level mobility)  Mod assist(Ambulated 3 feet RW)   Upper Body Dressing  Independent  Min assist   Lower Body Dressing  Independent  Mod assist   Grooming  Independent  Other(supervision)   Eating/Drinking  Independent  Other(Set up)   Toilet Transfer  Other(supervision)  Mod assist   Bladder Continence   WDL  Has an external catheter in place   Bowel Management  WDL  Last BM 06/20/17   Stair Climbing  Dependent Total assist   Communication  Verbal  Verbal, but speaks very slowly and in a soft voice.  Some pauses in conversation   Memory  WDL  Impaired   Cooking/Meal Prep  Mother does cooking      Housework  Mother does housework    Corporate investment banker  Not recently driving     Special needs/care consideration BiPAP/CPAP No CPM No Continuous Drip IV 0.9% NS w/20 meq KCL 125 mL/hr Dialysis No       Life Vest No Oxygen No Special Bed No Trach Size No Wound Vac (area) No     Skin No                               Bowel mgmt: Last BM 06/20/17 Bladder mgmt: External catheter Diabetic mgmt No  Previous Home Environment Living Arrangements: Parent Home Care Services: No Additional Comments: Pt is care giver for elderly parents  Discharge Living Setting Plans for Discharge Living Setting: House, Lives with (comment)(Lives with his parents.) Type of Home at Discharge: House Discharge Home Layout: One level Discharge Home Access: Stairs to  enter Entrance Stairs-Number of Steps: 2-3 step entry Does the patient have any problems obtaining your medications?: No  Social/Family/Support Systems Patient Roles: Parent, Other (Comment)(Has parents and a 59 yo son.) Contact Information: Theda Belfast - son - [phone] Anticipated Caregiver: Mother Anticipated Caregiver's Contact Information: Londell Moh - mother - [phone] Ability/Limitations of Caregiver: Mother has been assisting at home and can provide supervision and light assistance. Caregiver Availability: 24/7 Discharge Plan Discussed with Primary Caregiver: Yes Is Caregiver In Agreement with Plan?: Yes Does Caregiver/Family have Issues with Lodging/Transportation while Pt is in Rehab?: No  Goals/Additional Needs Patient/Family Goal for Rehab: PT/OT mod I and supervision goals Expected length of stay: 14-18 days Cultural Considerations: None Dietary Needs: Regular diet, thin liquids Equipment Needs: TBD Pt/Family Agrees to Admission and willing to participate: Yes Program Orientation Provided & Reviewed with Pt/Caregiver Including Roles  & Responsibilities: Yes  Patient Condition: I met with patient at the bedside and I have reviewed her medical record.  She was recently diagnosed with GBS and was at a SNF for about a month.  She  has had a functional decline and will benefit from 3 hours of therapy a day to regain independence.  She needs the coordinated efforts of the rehab team to manage her pain as well as medical issues.  I have rehab MD approval to admit to acute inpatient rehab today.   Preadmission Screen Completed By:  Retta Diones, 06/23/2017 2:27 PM ______________________________________________________________________   Discussed status with Dr. Letta Pate on 06/23/17 at 1427 and received telephone approval for admission today.  Admission Coordinator:  Retta Diones, time 1427/Date 06/23/17   Assessment/Plan: Diagnosis: Debility after viral  syndrome 1. Does the need for close, 24 hr/day  Medical supervision in concert with the patient's rehab needs make it unreasonable for this patient to be served in a less intensive setting? Yes 2. Co-Morbidities requiring supervision/potential complications: Guillain Barr syndrome 3. Due to bladder management, bowel management, safety, skin/wound care, disease management, medication administration, pain management and patient education, does the patient require 24 hr/day rehab nursing? Yes 4. Does the patient require coordinated care of a physician, rehab nurse, PT (1-2 hrs/day, 5 days/week) and OT (1-2 hrs/day, 5 days/week) to address physical and functional deficits in the context of the above medical diagnosis(es)? Yes Addressing deficits in the following areas: balance, endurance, locomotion, strength, transferring, bowel/bladder control, bathing, dressing, grooming, toileting and psychosocial support 5. Can the patient actively participate in an intensive therapy program of at least 3 hrs of therapy 5 days a week? Yes 6. The potential for patient to make measurable gains while on inpatient rehab is good 7. Anticipated functional outcomes upon discharge from inpatients are: supervision PT, supervision OT, n/a SLP 8. Estimated rehab length of stay to reach the above functional goals is: 14-17d 9. Does the patient have adequate social supports to accommodate these discharge functional goals? Yes 10. Anticipated D/C setting: Home 11. Anticipated post D/C treatments: [place] therapy 12. Overall Rehab/Functional Prognosis: good    RECOMMENDATIONS: This patient's condition is appropriate for continued rehabilitative care in the following setting: CIR Patient has agreed to participate in recommended program. Yes Note that insurance prior authorization may be required for reimbursement for recommended care.  Comment:  Retta Diones 06/23/2017          Revision History

## 2017-06-23 NOTE — Progress Notes (Signed)
[facility] inpatient rehab admissions - I met with patient this am.  PT/OT recommending inpatient rehab.  I tried to call her mother, but no answer and no voicemail set up.  I would like to talk with family prior to potentially admitting to inpatient rehab at [facility].  Call me for questions.  #[phone]

## 2017-06-23 NOTE — Progress Notes (Signed)
1515 06/23/17 nursing To rehab per [facility] alert patient. Patient claims she is in pain at this time She claims her neuropathy on her legs bothers her a lot. Oriented to unit set up. Fall prevention sheet discussed and signed by patient. PA came to see the patient. Skin assessment done. Pain med given.

## 2017-06-23 NOTE — PMR Pre-admission (Signed)
[facility] PMR Admission Coordinator Pre-Admission Assessment  Patient: Catherine Butler is an 57 y.o., female MRN: [id number] DOB: 1961-04-27 Height: '5\' 3"'$  (160 cm) Weight: 68.9 kg (151 lb 14.4 oz)  Insurance Information Self pay - no insurance   Medicaid Application Date: 07/24/27      Case Manager:   Disability Application Date: Pending      Case Worker:    Emergency Contact Information Contact Information    Name Relation Home Work Mobile   Medina Son   [phone]   [facility] Mother [phone]        Current Medical History  Patient Admitting Diagnosis: Debility, recent GBS  History of Present Illness: A 57 y.o.femalewith history of anxiety disorder, depression, alcohol abuse, COPD, admission [facility] 10/22-10/31/18 with back pain, progressive BUE/BLE paraesthesias, difficulty with walking, bowel incontinence and urinary retention. Also with reports of recent flu vaccine and was treated with IVIG due to suspicion of GBS and discharged to SNF with recommendations of EMG by neurology on outpatient basis. She was discharged to home recently but readmitted to [facility] on 06/20/17 with nausea/vomiting  X four day and weakness. CT abdomen done revealing hepatic steatosis, stigmata of chronic pancreatitis with pancreatitic calcifications, increased fecal retention and stable moderate HH. She was noted to have urinary retention with bladder volume 600cc  Electrolyte abnormality felt to be due to viral syndrome v/s food poisoning and treated with IVF. UDS negative. UA negative.  Therapy evaluations done showing diffuse weakness, poor safety and neuropathy affecting functional status.  CIR recommended for aggressive rehab program.   Patient's medical record from [facility] has been reviewed by the rehabilitation admission coordinator and physician.  Past Medical History  Past Medical History:  Diagnosis Date  . Anxiety   . Arthritis   . Asthma   . COPD (chronic  obstructive pulmonary disease) ([place])   . Depression   . H/O alcohol abuse   . Hypertension   . Reflux   . Suicide attempt by multiple drug overdose ([place]) 2014    Family History   family history includes Allergic rhinitis in her paternal grandfather; Breast cancer (age of onset: 67) in her mother; Diabetes in her father; Hypertension in her brother; Hypertension (age of onset: 33) in her father.  Prior Rehab/Hospitalizations Has the patient had major surgery during 100 days prior to admission? No.Went to [facility] for a month in Nov/Dec 2018 after diagnosis of GBS.  Current Medications See MAR from [facility]  Patients Current Diet:  Regular diet, thin liquids  Precautions / Restrictions Precautions Precautions: Fall Restrictions Weight Bearing Restrictions: No   Has the patient had 2 or more falls or a fall with injury in the past year?Yes.  Patient reports 8-10 falls over the last year  Prior Activity Level Limited Community (1-2x/wk): Went out 1 X a week  Prior Functional Level Self Care: Did the patient need help bathing, dressing, using the toilet or eating?  Needed some help  Indoor Mobility: Did the patient need assistance with walking from room to room (with or without device)? Needed some help  Stairs: Did the patient need assistance with internal or external stairs (with or without device)? Needed some help  Functional Cognition: Did the patient need help planning regular tasks such as shopping or remembering to take medications? Independent  Home Assistive Devices / Equipment Home Assistive Devices/Equipment: Bedside commode/3-in-1, Wheelchair  Prior Device Use: Indicate devices/aids used by the patient prior to current illness, exacerbation or  injury? Manual wheelchair and Walker   Prior Functional Level Current Functional Level  Bed Mobility  Independent  Mod assist   Transfers  Other(supervision)  Mod assist   Mobility -  Walk/Wheelchair  Mod Independent(Wheel chair level mobility)  Mod assist(Ambulated 3 feet RW)   Upper Body Dressing  Independent  Min assist   Lower Body Dressing  Independent  Mod assist   Grooming  Independent  Other(supervision)   Eating/Drinking  Independent  Other(Set up)   Toilet Transfer  Other(supervision)  Mod assist   Bladder Continence   WDL  Has an external catheter in place   Bowel Management  WDL  Last BM 06/20/17   Stair Climbing  Dependent Total assist   Communication  Verbal  Verbal, but speaks very slowly and in a soft voice.  Some pauses in conversation   Memory  WDL  Impaired   Cooking/Meal Prep  Mother does cooking      Housework  Mother does housework    Corporate investment banker  Not recently driving     Special needs/care consideration BiPAP/CPAP No CPM No Continuous Drip IV 0.9% NS w/20 meq KCL 125 mL/hr Dialysis No       Life Vest No Oxygen No Special Bed No Trach Size No Wound Vac (area) No     Skin No                               Bowel mgmt: Last BM 06/20/17 Bladder mgmt: External catheter Diabetic mgmt No  Previous Home Environment Living Arrangements: Parent Home Care Services: No Additional Comments: Pt is care giver for elderly parents  Discharge Living Setting Plans for Discharge Living Setting: House, Lives with (comment)(Lives with his parents.) Type of Home at Discharge: House Discharge Home Layout: One level Discharge Home Access: Stairs to enter Entrance Stairs-Number of Steps: 2-3 step entry Does the patient have any problems obtaining your medications?: No  Social/Family/Support Systems Patient Roles: Parent, Other (Comment)(Has parents and a 66 yo son.) Contact Information: Theda Belfast - son - [phone] Anticipated Caregiver: Mother Anticipated Caregiver's Contact Information: Londell Moh - mother - [phone] Ability/Limitations of Caregiver: Mother has been  assisting at home and can provide supervision and light assistance. Caregiver Availability: 24/7 Discharge Plan Discussed with Primary Caregiver: Yes Is Caregiver In Agreement with Plan?: Yes Does Caregiver/Family have Issues with Lodging/Transportation while Pt is in Rehab?: No  Goals/Additional Needs Patient/Family Goal for Rehab: PT/OT mod I and supervision goals Expected length of stay: 14-18 days Cultural Considerations: None Dietary Needs: Regular diet, thin liquids Equipment Needs: TBD Pt/Family Agrees to Admission and willing to participate: Yes Program Orientation Provided & Reviewed with Pt/Caregiver Including Roles  & Responsibilities: Yes  Patient Condition: I met with patient at the bedside and I have reviewed her medical record.  She was recently diagnosed with GBS and was at a SNF for about a month.  She has had a functional decline and will benefit from 3 hours of therapy a day to regain independence.  She needs the coordinated efforts of the rehab team to manage her pain as well as medical issues.  I have rehab MD approval to admit to acute inpatient rehab today.   Preadmission Screen Completed By:  Retta Diones, 06/23/2017 2:27 PM ______________________________________________________________________   Discussed status with Dr. Letta Pate on 06/23/17 at 1427 and received telephone approval for admission today.  Admission Coordinator:  Retta Diones, time 1427/Date 06/23/17   Assessment/Plan: Diagnosis: Debility after viral syndrome 1. Does the need for close, 24 hr/day  Medical supervision in concert with the patient's rehab needs make it unreasonable for this patient to be served in a less intensive setting? Yes 2. Co-Morbidities requiring supervision/potential complications: Guillain Barr syndrome 3. Due to bladder management, bowel management, safety, skin/wound care, disease management, medication administration, pain management and patient education, does the  patient require 24 hr/day rehab nursing? Yes 4. Does the patient require coordinated care of a physician, rehab nurse, PT (1-2 hrs/day, 5 days/week) and OT (1-2 hrs/day, 5 days/week) to address physical and functional deficits in the context of the above medical diagnosis(es)? Yes Addressing deficits in the following areas: balance, endurance, locomotion, strength, transferring, bowel/bladder control, bathing, dressing, grooming, toileting and psychosocial support 5. Can the patient actively participate in an intensive therapy program of at least 3 hrs of therapy 5 days a week? Yes 6. The potential for patient to make measurable gains while on inpatient rehab is good 7. Anticipated functional outcomes upon discharge from inpatients are: supervision PT, supervision OT, n/a SLP 8. Estimated rehab length of stay to reach the above functional goals is: 14-17d 9. Does the patient have adequate social supports to accommodate these discharge functional goals? Yes 10. Anticipated D/C setting: Home 11. Anticipated post D/C treatments: [place] therapy 12. Overall Rehab/Functional Prognosis: good    RECOMMENDATIONS: This patient's condition is appropriate for continued rehabilitative care in the following setting: CIR Patient has agreed to participate in recommended program. Yes Note that insurance prior authorization may be required for reimbursement for recommended care.  Comment:  Retta Diones 06/23/2017

## 2017-06-23 NOTE — Progress Notes (Signed)
Patient is being discharged to inpatient rehab at [facility], Room [id number]. Report given to [place] at inpatient rehab. No questions or concerns at this time. [facility]link is scheduled for 1430.

## 2017-06-23 NOTE — H&P (Signed)
Physical Medicine and Rehabilitation Admission H&P        Chief Complaint  Patient presents with  . Debility      HPI: Catherine Butler is a 57 y.o. female with history of anxiety disorder, depression, alcohol abuse, COPD, admission [facility] 10/22-10/31/18 with back pain, progressive BUE/BLE paraesthesias, difficulty with walking, bowel incontinence and urinary retention. Also with reports of recent flu vaccine and was treated with IVIG due to suspicion of GBS and discharged to SNF with recommendations of EMG by neurology on outpatient basis. She was discharged to home few weeks ago but readmitted on [facility] on 06/20/17 with nausea/vomiting  X four day and weakness. CT abdomen done revealing hepatic steatosis, stigmata of chronic pancreatitis with pancreatitic calcifications, increased fecal retention and stable moderate HH. She was noted to have urinary retention with bladder volume 600cc   Electrolyte abnormality felt to be due to viral syndrome v/s food poisoning and  treated with IVF. UDS negative. UA negative.  Therapy evaluations done showing diffuse weakness, poor safety and neuropathy affecting functional status.  CIR recommended for aggressive rehab program.      Review of Systems  HENT: Negative for hearing loss and tinnitus.   Eyes: Negative for blurred vision and double vision.  Respiratory: Negative for cough and shortness of breath.   Cardiovascular: Negative for chest pain and palpitations.  Gastrointestinal: Positive for constipation (no BM since admission). Negative for abdominal pain, heartburn and nausea.  Genitourinary: Negative for dysuria and urgency.  Musculoskeletal: Negative for falls (Falls at SNF but denies fall at home).  Skin: Negative for itching and rash.  Neurological: Positive for tingling, sensory change, focal weakness and weakness. Negative for dizziness and headaches.  Psychiatric/Behavioral: The patient is nervous/anxious.       Past Medical History:  Diagnosis  Date  . Anxiety    . Arthritis    . Asthma    . COPD (chronic obstructive pulmonary disease) ([place])    . Depression    . H/O alcohol abuse    . Hypertension    . Reflux    . Suicide attempt by multiple drug overdose ([place]) 2014           Past Surgical History:  Procedure Laterality Date  . LAPAROSCOPY      . laporscopy surgery for ovarian cyst   20 years ago  . TONSILLECTOMY               Family History  Problem Relation Age of Onset  . Diabetes Father    . Hypertension Father 61        heart attack  . Breast cancer Mother 30        lump removed, bile duct cancer  . Hypertension Brother    . Allergic rhinitis Paternal Grandfather        Social History: Lives with parents. Has been wheelchair for mobility--was able to stand and take a couple of steps to pivot to BSC/chair. She  reports that she has been smoking cigarettes--1 PPD.  She has a 30.00 pack-year smoking history. she has never used smokeless tobacco. She reports that she drinks alcohol--on and off. She reports that she does not use drugs.           Allergies  Allergen Reactions  . Lipitor [Atorvastatin] Other (See Comments)      "really bad leg pain"            Medications Prior to Admission  Medication Sig Dispense Refill  .  acetaminophen (TYLENOL) 500 MG tablet Take 1,000 mg by mouth 2 (two) times daily as needed for headache.      . albuterol (PROVENTIL HFA;VENTOLIN HFA) 108 (90 Base) MCG/ACT inhaler Inhale 2 puffs into the lungs every 6 (six) hours as needed for wheezing. 1 Inhaler 0  . dimenhyDRINATE (DRAMAMINE) 50 MG tablet Take 50 mg by mouth every 8 (eight) hours as needed for nausea.      Marland Kitchen gabapentin (NEURONTIN) 400 MG capsule Take 400 mg by mouth 4 (four) times daily.       Marland Kitchen HYDROcodone-acetaminophen (NORCO/VICODIN) 5-325 MG tablet Take 1 tablet by mouth every 4 (four) hours as needed for moderate pain. 30 tablet 0  . hydrOXYzine (ATARAX/VISTARIL) 25 MG tablet Take 25 mg by mouth every 6 (six)  hours as needed for anxiety.      . metoprolol tartrate (LOPRESSOR) 25 MG tablet Take 0.5 tablets (12.5 mg total) by mouth 2 (two) times daily. 60 tablet 11  . mirtazapine (REMERON) 15 MG tablet Take 15 mg by mouth at bedtime.      Marland Kitchen omeprazole (PRILOSEC) 40 MG capsule Take 1 capsule (40 mg total) by mouth 2 (two) times daily. 60 capsule 1  . ondansetron (ZOFRAN) 4 MG tablet Take 1 tablet (4 mg total) by mouth every 6 (six) hours as needed for nausea. 20 tablet 0  . QUEtiapine (SEROQUEL) 400 MG tablet Take 400 mg by mouth at bedtime.          Drug Regimen Review  Drug regimen was reviewed and remains appropriate with no significant issues identified   Home: Home Living Family/patient expects to be discharged to:: Unsure Living Arrangements: Parent Additional Comments: Pt is care giver for elderly parents   Functional History: Prior Function Level of Independence: Needs assistance Gait / Transfers Assistance Needed: Pt has regressed to largely using WC for mobility ADL's / Homemaking Assistance Needed: Assist of mother Comments: pt states she completes ADLs from bed level   Functional Status:  Mobility: Bed Mobility Overal bed mobility: Needs Assistance Bed Mobility: Sit to Supine Supine to sit: Min assist, HOB elevated Sit to supine: Min assist, Mod assist, HOB elevated General bed mobility comments: use of bed rail; light assist for trunk sitting up and assist for legs for back to bed Transfers Overall transfer level: Needs assistance Equipment used: 1 person hand held assist Transfers: Sit to/from Stand Sit to Stand: Min assist Stand pivot transfers: Min assist General transfer comment: therapist stood in front of pt for first sit to stand and transfer. Then used RW for second sit to stand.  Decreased postural stability/shaking/ataxic movements noted after 20 seconds Ambulation/Gait Ambulation/Gait assistance: Min assist, Mod assist, +2 safety/equipment Ambulation Distance  (Feet): 3 Feet Assistive device: Rolling walker (2 wheeled) Gait Pattern/deviations: Step-through pattern, Decreased step length - right, Decreased step length - left, Shuffle, Antalgic, Staggering left, Staggering right, Wide base of support General Gait Details: Increased time and concentration for foot placement.  Cues for posture and position from RW.  Physical assist for balance, support and RW management Gait velocity: decr Gait velocity interpretation: Below normal speed for age/gender   ADL: ADL Overall ADL's : Needs assistance/impaired Eating/Feeding: Set up, Sitting Grooming: Supervision/safety, Sitting Upper Body Bathing: Min guard, Sitting Lower Body Bathing: Minimal assistance, Bed level Upper Body Dressing : Min guard, Sitting Lower Body Dressing: Moderate assistance, Sit to/from stand General ADL Comments: pt very itchy this session and distracted by this.  Worked on sit to stand x2  with min A.  Pt gets shaky/ataxic when standing longer than 30 seconds.  She also is fearful of falling as she has had many falls, and she rushes during pivot to sit.  Cues given for safety.  pt is using regular utensils now, and uses both hands to hold cup; weakness noted   Cognition: Cognition Overall Cognitive Status: Within Functional Limits for tasks assessed Orientation Level: Oriented X4 Cognition Arousal/Alertness: Awake/alert Behavior During Therapy: Flat affect Overall Cognitive Status: Within Functional Limits for tasks assessed     Blood pressure (!) 137/102, pulse (!) 125, temperature 99.2 F (37.3 C), temperature source Oral, resp. rate 19, height 5' 3" (1.6 m), weight 68.9 kg (151 lb 14.4 oz), last menstrual period 02/22/2006, SpO2 100 %. Physical Exam  Nursing note and vitals reviewed. Constitutional: She is oriented to person, place, and time. She appears well-developed and well-nourished. She appears distressed.  Anxious and jumpy. Writhing in bed and teary due to pain.  Appears older than state age.   HENT:  Head: Normocephalic and atraumatic.  Mouth/Throat: Oropharynx is clear and moist.  Multiple missing teeth.   Eyes: Conjunctivae are normal. Pupils are equal, round, and reactive to light.  Neck: Normal range of motion. Neck supple.  Cardiovascular: Regular rhythm. Tachycardia present.  Respiratory: Effort normal and breath sounds normal. No stridor. No respiratory distress. She has no wheezes.  GI: Soft. Bowel sounds are normal.  Musculoskeletal: She exhibits no edema.  BLE hypersensitive, dysesthetic Neurological: She is alert and oriented to person, place, and time. Coordination normal.  Speech clear. Anxious. BUE with decreased motor control and distal weakness.   Skin: Skin is warm and dry. She is not diaphoretic.  Psychiatric: Her speech is normal. Her mood appears anxious. She expresses inappropriate judgment. She is inattentive.   Mild ataxia right finger-nose-finger and left finger-nose-finger Moderate ataxia bilateral heel to shin Sensation reduced to proprioception left great toe but intact in the right great toe light touch is intact bilaterally in upper and lower limbs.  Motor strength is 4/5 bilateral deltoid bicep tricep finger flexors and extensors 3/5 bilateral hip flexor knee extensor ankle dorsiflexor plantar flexor Lab Results Last 48 Hours        Results for orders placed or performed during the hospital encounter of 06/20/17 (from the past 48 hour(s))  Basic metabolic panel     Status: Abnormal    Collection Time: 06/22/17  5:47 AM  Result Value Ref Range    Sodium 135 135 - 145 mmol/L    Potassium 4.1 3.5 - 5.1 mmol/L      Comment: DELTA CHECK NOTED REPEATED TO VERIFY NO VISIBLE HEMOLYSIS      Chloride 111 101 - 111 mmol/L    CO2 18 (L) 22 - 32 mmol/L    Glucose, Bld 81 65 - 99 mg/dL    BUN <5 (L) 6 - 20 mg/dL    Creatinine, Ser 0.41 (L) 0.44 - 1.00 mg/dL    Calcium 7.5 (L) 8.9 - 10.3 mg/dL    GFR calc non Af Amer >60  >60 mL/min    GFR calc Af Amer >60 >60 mL/min      Comment: (NOTE) The eGFR has been calculated using the CKD EPI equation. This calculation has not been validated in all clinical situations. eGFR's persistently <60 mL/min signify possible Chronic Kidney Disease.      Anion gap 6 5 - 15  Magnesium     Status: Abnormal    Collection Time: 06/22/17  5:47 AM  Result Value Ref Range    Magnesium 1.5 (L) 1.7 - 2.4 mg/dL      Imaging Results (Last 48 hours)  No results found.           Medical Problem List and Plan: 1.    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  DVT Prophylaxis/Anticoagulation: Pharmaceutical: Lovenox 3. Pain Management: will likely needs gabapentin titrated upwards. Ultram and/or tylenol prn 4. Mood: LCSW to provide ego support. LCSW to follow for evaluation and support.  5. Neuropsych: This patient is capable of making decisions on her own behalf. 6. Skin/Wound Care: routine pressure relief measurs 7. Fluids/Electrolytes/Nutrition: Recheck lytes in am. Hyponatremia/hypokalemia resolved.  8. ABLA: Recheck labs in am. 9. H/o Constipation: CT with evidence of constipation. Hasn't had BM yest. Will schedule miralax daily.  10. Urinary retention: Check UA/UCS. Monitor voiding with PVR checks.  11. Anxiety disorder: Continue Remeron and Seroquel at bedtime. Tachycardia/elevated BP due to pain and anxiety at admission. Recheck vitals.  Sees Dr. Sabra Heck at behavioral health recommend neuropsych eval 12. Neuropathy: continue Gabapentin 400 mg qid.   13.  History of alcohol abuse has CT abdomen evidence of fatty liver plus chronic pancreatitis.  Patient states that she last drank heavily a couple years ago, with lighter alcohol use last around 3 or 4 months ago 14.  Tobacco abuse will start NicoDerm patch  Post Admission Physician Evaluation: 1. Functional deficits secondary  to quadriparesis and ataxia. 2. Patient is admitted to receive collaborative,  interdisciplinary care between the physiatrist, rehab nursing staff, and therapy team. 3. Patient's level of medical complexity and substantial therapy needs in context of that medical necessity cannot be provided at a lesser intensity of care such as a SNF. 4. Patient has experienced substantial functional loss from his/her baseline which was documented above under the "Functional History" and "Functional Status" headings.  Judging by the patient's diagnosis, physical exam, and functional history, the patient has potential for functional progress which will result in measurable gains while on inpatient rehab.  These gains will be of substantial and practical use upon discharge  in facilitating mobility and self-care at the household level. 5. Physiatrist will provide 24 hour management of medical needs as well as oversight of the therapy plan/treatment and provide guidance as appropriate regarding the interaction of the two. 6. The Preadmission Screening has been reviewed and patient status is unchanged unless otherwise stated above. 7. 24 hour rehab nursing will assist with bladder management, bowel management, safety, skin/wound care, disease management, medication administration, pain management and patient education  and help integrate therapy concepts, techniques,education, etc. 8. PT will assess and treat for/with: pre gait, gait training, endurance , safety, equipment, neuromuscular re education.   Goals are: ModI/Sup. 9. OT will assess and treat for/with: ADLs, Cognitive perceptual skills, Neuromuscular re education, safety, endurance, equipment.   Goals are: Mod I/S. Therapy may proceed with showering this patient. 10. SLP will assess and treat for/with: NA.  Goals are: NA. 11. Case Management and Social Worker will assess and treat for psychological issues and discharge planning. 12. Team conference will be held weekly to assess progress toward goals and to determine barriers to  discharge. 13. Patient will receive at least 3 hours of therapy per day at least 5 days per week. 14. ELOS: 12-16d      15. Prognosis:  good         Charlett Blake M.D. [facility] FAAPM&R (Sports Med, Neuromuscular  Med) Diplomate Am Board of Electrodiagnostic Med  Flora Lipps 06/23/2017

## 2017-06-23 NOTE — Discharge Instructions (Signed)
Discharge to inpatient rehab Solid food as tolerated Recheck BMP and serum  Magnesium on 06/26/17

## 2017-06-24 ENCOUNTER — Inpatient Hospital Stay (HOSPITAL_COMMUNITY): Payer: Self-pay

## 2017-06-24 ENCOUNTER — Inpatient Hospital Stay (HOSPITAL_COMMUNITY): Payer: Self-pay | Admitting: Physical Therapy

## 2017-06-24 ENCOUNTER — Inpatient Hospital Stay (HOSPITAL_COMMUNITY): Payer: Self-pay | Admitting: Occupational Therapy

## 2017-06-24 DIAGNOSIS — E46 Unspecified protein-calorie malnutrition: Secondary | ICD-10-CM

## 2017-06-24 DIAGNOSIS — K5901 Slow transit constipation: Secondary | ICD-10-CM

## 2017-06-24 LAB — CBC WITH DIFFERENTIAL/PLATELET
BASOS ABS: 0 10*3/uL (ref 0.0–0.1)
BASOS PCT: 1 %
Eosinophils Absolute: 0.1 10*3/uL (ref 0.0–0.7)
Eosinophils Relative: 2 %
HEMATOCRIT: 31.9 % — AB (ref 36.0–46.0)
HEMOGLOBIN: 10.9 g/dL — AB (ref 12.0–15.0)
Lymphocytes Relative: 41 %
Lymphs Abs: 1.4 10*3/uL (ref 0.7–4.0)
MCH: 30.8 pg (ref 26.0–34.0)
MCHC: 34.2 g/dL (ref 30.0–36.0)
MCV: 90.1 fL (ref 78.0–100.0)
MONOS PCT: 15 %
Monocytes Absolute: 0.5 10*3/uL (ref 0.1–1.0)
NEUTROS ABS: 1.4 10*3/uL — AB (ref 1.7–7.7)
Neutrophils Relative %: 41 %
Platelets: 197 10*3/uL (ref 150–400)
RBC: 3.54 MIL/uL — AB (ref 3.87–5.11)
RDW: 13.3 % (ref 11.5–15.5)
WBC: 3.4 10*3/uL — AB (ref 4.0–10.5)

## 2017-06-24 LAB — COMPREHENSIVE METABOLIC PANEL
ALBUMIN: 1.9 g/dL — AB (ref 3.5–5.0)
ALK PHOS: 97 U/L (ref 38–126)
ALT: 16 U/L (ref 14–54)
ANION GAP: 7 (ref 5–15)
AST: 24 U/L (ref 15–41)
BILIRUBIN TOTAL: 0.9 mg/dL (ref 0.3–1.2)
BUN: <5 mg/dL — ABNORMAL LOW (ref 6–20)
CO2: 18 mmol/L — ABNORMAL LOW (ref 22–32)
Calcium: 8.2 mg/dL — ABNORMAL LOW (ref 8.9–10.3)
Chloride: 108 mmol/L (ref 101–111)
Creatinine, Ser: 0.48 mg/dL (ref 0.44–1.00)
GFR calc non Af Amer: >60 mL/min (ref >60)
Glucose, Bld: 100 mg/dL — ABNORMAL HIGH (ref 65–99)
POTASSIUM: 4.1 mmol/L (ref 3.5–5.1)
SODIUM: 133 mmol/L — AB (ref 135–145)
Total Protein: 4.8 g/dL — ABNORMAL LOW (ref 6.5–8.1)

## 2017-06-24 MED ORDER — POLYETHYLENE GLYCOL 3350 17 G PO PACK
17.0000 g | PACK | Freq: Two times a day (BID) | ORAL | Status: DC
  Administered 2017-06-24 – 2017-07-05 (×17): 17 g via ORAL
  Filled 2017-06-24 (×25): qty 1

## 2017-06-24 MED ORDER — NICOTINE 7 MG/24HR TD PT24
7.0000 mg | MEDICATED_PATCH | Freq: Every day | TRANSDERMAL | Status: DC
  Administered 2017-06-24 – 2017-07-05 (×6): 7 mg via TRANSDERMAL
  Filled 2017-06-24 (×10): qty 1

## 2017-06-24 MED ORDER — PRO-STAT SUGAR FREE PO LIQD
30.0000 mL | Freq: Two times a day (BID) | ORAL | Status: DC
  Administered 2017-06-24 – 2017-07-06 (×25): 30 mL via ORAL
  Filled 2017-06-24 (×25): qty 30

## 2017-06-24 NOTE — Progress Notes (Signed)
Occupational Therapy Assessment and Plan  Patient Details  Name: Catherine Butler MRN: [id number] Date of Birth: 10-Aug-1960  OT Diagnosis: acute pain, ataxia, muscle weakness (generalized) and pain in joint Rehab Potential: Rehab Potential (ACUTE ONLY): Good ELOS: 14-18 days   Today's Date: 06/24/2017 OT Individual Time: 1301-1401 OT Individual Time Calculation (min): 60 min     Problem List:  Patient Active Problem List   Diagnosis Date Noted  . Debility 06/23/2017  . Constipation   . Quadriplegia and quadriparesis ([place])   . Guillain Barr syndrome ([place])   . Nausea & vomiting 06/21/2017  . Hypokalemia 06/21/2017  . Volume depletion 06/21/2017  . Sinus tachycardia 06/21/2017  . Nausea and vomiting 06/21/2017  . Essential hypertension 04/14/2017  . Paresthesia   . 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 Abdominal pain 01/01/2017  . Sepsis ([place]) 01/01/2017  . Tobacco abuse 01/01/2017  . Abnormal LFTs 01/01/2017  . Depression   . COPD (chronic obstructive pulmonary disease) ([place]) 06/16/2015    Past Medical History:  Past Medical History:  Diagnosis Date  . Anxiety   . Arthritis   . Asthma   . COPD (chronic obstructive pulmonary disease) ([place])   . Depression   . H/O alcohol abuse   . Hypertension   . Reflux   . Suicide attempt by multiple drug overdose ([place]) 2014   Past Surgical History:  Past Surgical History:  Procedure Laterality Date  . LAPAROSCOPY    . laporscopy surgery for ovarian cyst  20 years ago  . TONSILLECTOMY      Assessment & Plan Clinical Impression: Patient is a 57 y.o. year old female  with history of anxiety disorder, depression, alcohol abuse, COPD, admission [facility] 10/22-10/31/18 with back pain, progressive BUE/BLE paraesthesias, difficulty with walking, bowel incontinence and urinary retention. Also with reports of recent flu vaccine and was treated with IVIG due to suspicion of GBS and discharged to SNF with  recommendations of EMG by neurology on outpatient basis. She was discharged to home few weeks ago but readmitted on [facility] on 06/20/17 with nausea/vomiting  X four day and weakness. CT abdomen done revealing hepatic steatosis, stigmata of chronic pancreatitis with pancreatitic calcifications, increased fecal retention and stable moderate HH. She was noted to have urinary retention with bladder volume 600cc. Patient transferred to CIR on 06/23/2017 .    Patient currently requires mod with basic self-care skills secondary to muscle weakness and low endurance/activity tolerance, and decreased sitting balance, decreased standing balance and decreased balance strategies.  Prior to hospitalization, patient could complete BADL with supervision and intermittent min A.  Patient will benefit from skilled intervention to increase independence with basic self-care skills prior to discharge home with care partner.  Anticipate patient will require 24 hour supervision and follow up home health.  OT - End of Session Endurance Deficit: Yes Endurance Deficit Description: multiple rest breaks within BADL taks  OT Assessment Rehab Potential (ACUTE ONLY): Good OT Patient demonstrates impairments in the following area(s): Balance;Behavior;Endurance;Motor;Pain;Sensory OT Basic ADL's Functional Problem(s): Eating;Grooming;Bathing;Dressing;Toileting OT Transfers Functional Problem(s): Toilet;Tub/Shower OT Additional Impairment(s): None OT Plan OT Intensity: Minimum of 1-2 x/day, 45 to 90 minutes OT Frequency: 5 out of 7 days OT Duration/Estimated Length of Stay: 14-18 days OT Treatment/Interventions: [employer] reintegration;Discharge planning;Balance/vestibular training;DME/adaptive equipment instruction;Functional mobility training;Pain management;Patient/family education;Self Care/advanced ADL retraining;Therapeutic Activities;Therapeutic Exercise;UE/LE Strength taining/ROM;UE/LE Coordination activities;Wheelchair  propulsion/positioning OT Self Feeding Anticipated Outcome(s): Mod I OT Basic Self-Care Anticipated Outcome(s): Supervision OT Toileting Anticipated Outcome(s):  Supervision  OT Bathroom Transfers Anticipated Outcome(s): Supervision OT Recommendation Recommendations for Other Services: Neuropsych consult Patient destination: Home Follow Up Recommendations: Home health OT Equipment Recommended: To be determined Equipment Details: Likely a tub transfer bench   Skilled Therapeutic Intervention OT eval completed addressing PLOF, rehab process, OT purpose, POC, ELOS, and goals.  Limited ADL assessment secondary to pt pain, fatigue and limited pt participation. Pt greeted sitting in wc and upset that she had been left up in wc so long. Pt adamently requested to return to bed and declined any BADL tasks initially. Pt completed squat-pivot to bed with mod A.  After short rest break supine in bed, pt agreeable to don clothing and participate in eval. Pt needed max A to complete LB dressing 2/2 fatigue and generalized chronic pain. Pt returned to bed for rolling to pull up pants as pt declined to stand despite encouragement from OT. Pt took second rest break semi-reclined in bed, then participated in MMT and coordination exercises seated EOB. Pt tolerated sitting EOB for ~ 15 mins, then pt declined to participate further. Pt left semi-reclined in bed with bed alarm on and needs met.    OT Evaluation Precautions/Restrictions  Precautions Precautions: Fall Restrictions Weight Bearing Restrictions: No Pain Pain Assessment Pain Assessment: 0-10 Pain Score: 8  Pain Type: Chronic pain Pain Location: Generalized Pain Orientation: Right;Left Pain Descriptors / Indicators: Aching Pain Onset: On-going Pain Intervention(s): Repositioned Home Living/Prior Functioning Home Living Family/patient expects to be discharged to:: Private residence Living Arrangements: Parent Available Help at Discharge:  Family, Available 24 hours/day Type of Home: House Home Access: Stairs to enter Technical brewer of Steps: 3 Entrance Stairs-Rails: None Home Layout: One level Bathroom Shower/Tub: Chiropodist: Standard Bathroom Accessibility: Yes Additional Comments: Pt reports she was transferring to [facility] in her room PTA as her wc could not fit into the bathroom  Lives With: Family IADL History Homemaking Responsibilities: No IADL Comments: Before October of 2018, pt was independent, driving, cooking, cleaning etc Prior Function Level of Independence: Needs assistance with ADLs, Needs assistance with homemaking Bath: Minimal(bed level) Toileting: Minimal Dressing: Minimal Grooming: Supervision/set-up Feeding: Supervision/set-up Driving: No Vocation: Unemployed Comments: Pt enjoyed spending time with her son and grandchildren ADL ADL ADL Comments: Please see functional navigator Vision Baseline Vision/History: Wears glasses Wears Glasses: Reading only Patient Visual Report: No change from baseline Vision Assessment?: No apparent visual deficits Perception  Perception: Within Functional Limits Praxis Praxis: Intact Cognition Overall Cognitive Status: Within Functional Limits for tasks assessed Arousal/Alertness: Awake/alert Orientation Level: Place;Situation;Person Person: Oriented Place: Oriented Situation: Oriented Year: 2019 Month: January Day of Week: Correct Memory: Appears intact Immediate Memory Recall: Sock;Blue;Bed Memory Recall: Blue;Bed Memory Recall Blue: Without Cue Memory Recall Bed: Without Cue Attention: Selective Selective Attention: Appears intact Awareness: Appears intact Problem Solving: Appears intact Behaviors: Poor frustration tolerance Safety/Judgment: Impaired Sensation Sensation Light Touch: Impaired Detail Light Touch Impaired Details: Impaired RUE;Impaired LUE;Impaired RLE;Impaired LLE Additional Comments: neuropathy in  BLEs/BUEs baseline Coordination Gross Motor Movements are Fluid and Coordinated: No Fine Motor Movements are Fluid and Coordinated: No Coordination and Movement Description: B UE tremors, ataxia Finger Nose Finger Test: dysmetria Motor  Motor Motor: Tetraplegia;Ataxia Motor - Skilled Clinical Observations: generalized weakness in BUEs and B LEs Mobility  Bed Mobility Bed Mobility: Sit to Supine Supine to Sit: 5: Supervision Supine to Sit Details (indicate cue type and reason): increased time Sit to Supine: 5: Supervision  Trunk/Postural Assessment  Cervical Assessment Cervical Assessment: Within Functional Limits  Thoracic Assessment Thoracic Assessment: Exceptions to WFL(mild kyphosis) Lumbar Assessment Lumbar Assessment: Within Functional Limits Postural Control Postural Control: Deficits on evaluation  Balance Balance Balance Assessed: Yes Static Sitting Balance Static Sitting - Balance Support: Feet supported Static Sitting - Level of Assistance: 5: Stand by assistance Dynamic Sitting Balance Dynamic Sitting - Balance Support: During functional activity Dynamic Sitting - Level of Assistance: 5: Stand by assistance;4: Min assist(intermittent min A) Static Standing Balance Static Standing - Level of Assistance: Not tested (comment)(Pt declined any standing 2/2 fatigue) Dynamic Standing Balance Dynamic Standing - Level of Assistance: Not tested (comment)(Pt declined any standing 2/2 fatigue) Extremity/Trunk Assessment RUE Assessment RUE Assessment: Exceptions to [facility] RUE Strength RUE Overall Strength: Deficits RUE Overall Strength Comments: Limited shoulder ROM  ~(90 FF 2/2 painful joint Right Shoulder Flexion: 3/5 LUE Assessment LUE Assessment: Exceptions to [facility] LUE Strength LUE Overall Strength: Deficits LUE Overall Strength Comments: Limited shoulder ROM  ~(90 FF 2/2 painful joint Left Shoulder Flexion: 3-/5   See Function Navigator for Current Functional  Status.   Refer to Care Plan for Long Term Goals  Recommendations for other services: Neuropsych   Discharge Criteria: Patient will be discharged from OT if patient refuses treatment 3 consecutive times without medical reason, if treatment goals not met, if there is a change in medical status, if patient makes no progress towards goals or if patient is discharged from hospital.  The above assessment, treatment plan, treatment alternatives and goals were discussed and mutually agreed upon: by patient  Valma Cava 06/24/2017, 2:12 PM

## 2017-06-24 NOTE — Progress Notes (Signed)
Social Work Assessment and Plan Social Work Assessment and Plan  Patient Details  Name: Catherine Butler MRN: [id number] Date of Birth: 1960-09-08  Today's Date: 06/24/2017  Problem List:  Patient Active Problem List   Diagnosis Date Noted  . Debility 06/23/2017  . Constipation   . Quadriplegia and quadriparesis ([facility])   . Guillain Barr syndrome ([facility])   . Nausea & vomiting 06/21/2017  . Hypokalemia 06/21/2017  . Volume depletion 06/21/2017  . Sinus tachycardia 06/21/2017  . Nausea and vomiting 06/21/2017  . Essential hypertension 04/14/2017  . Paresthesia   . 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 Abdominal pain 01/01/2017  . Sepsis ([facility]) 01/01/2017  . Tobacco abuse 01/01/2017  . Abnormal LFTs 01/01/2017  . Depression   . COPD (chronic obstructive pulmonary disease) ([facility]) 06/16/2015   Past Medical History:  Past Medical History:  Diagnosis Date  . Anxiety   . Arthritis   . Asthma   . COPD (chronic obstructive pulmonary disease) ([facility])   . Depression   . H/O alcohol abuse   . Hypertension   . Reflux   . Suicide attempt by multiple drug overdose ([facility]) 2014   Past Surgical History:  Past Surgical History:  Procedure Laterality Date  . LAPAROSCOPY    . laporscopy surgery for ovarian cyst  20 years ago  . TONSILLECTOMY     Social History:  reports that she has been smoking cigarettes.  She has a 30.00 pack-year smoking history. she has never used smokeless tobacco. She reports that she drinks alcohol. She reports that she does not use drugs.  Family / Support Systems Marital Status: Divorced Patient Roles: Parent, Other (Comment)(has elderly parents) Children: Josh-son [phone] Other Supports: Jerene Dilling [phone]  Dad alos in the home he has dementia according to pt Anticipated Caregiver: Mom Ability/Limitations of Caregiver: Mom has been assisting with pt's care since 11/30. She currently has PNA Caregiver  Availability: 24/7 Family Dynamics: Pt had a bad experience in the NH and feels bad to burden her parents. Her Mom is the one who has been assisitng her and she really wants to be able to care for herself when she leaves here. She will need to push herself while here.  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 Her Mom is very involved in her care also   Abuse/Neglect Abuse/Neglect Assessment Can Be Completed: Yes Physical Abuse: Denies Verbal Abuse: Denies Sexual Abuse: Denies Exploitation of patient/patient's resources: Denies Self-Neglect: Denies  Emotional Status Pt's affect, behavior adn adjustment status: Pt is in pain and is trying to push through but feels very limited due to this. She wants to make a lot of progress while here, but will need to push herself and do all she can to achieve these goals. She was independent prior to Oct but has been struggling since then with her health issues. Recent Psychosocial Issues: recent health issues-from 03/2017 Pyschiatric History: history of depression/anxiety takes medications for this and finds them helpful but her health issues recently have made her feel down and very sad. Will make referral for neuro-psych to see while here for coping and support. Substance Abuse History: Tobacco was smoking prior to admission aware needs to quit and drinks socially-off and on. She  does not feel this is an issue for her.   Patient / Family Perceptions, Expectations & Goals Pt/Family understanding of illness & functional limitations: Pt and Mom can explain her diagnosis and treatments she has received for this. Both are  hopeful she will do well here and make progress to where she is recovering and not just existing in a wheelchair. Encouraged pt to ask her questions and gain information about her condition. Premorbid pt/family roles/activities: Mom, daughter, friend, etc Anticipated changes in roles/activities/participation: resume Pt/family expectations/goals: Pt states: " I am in such pain I can't do that much, I will try."  Mom states: " I hope she does well here she should have come here before going to the nursing home."  [employer] Community Agencies: Other (Comment)(recently at [facility]) Premorbid Home Care/DME Agencies: Other (Comment)(has wheelchair and bsc) Transportation available at discharge: Mom Resource referrals recommended: Neuropsychology, Support group (specify)  Discharge Planning Living Arrangements: Parent Support Systems: Parent, Children Type of Residence: Private residence Insurance Resources: Self-pay(pending medicaid) Financial Resources: Family Support Financial Screen Referred: Previously completed Living Expenses: Lives with family Money Management: Family Does the patient have any problems obtaining your medications?: No Home Management: Mom Patient/Family Preliminary Plans: Return home with parents, where she was staying prior to admission and after NH. Her Medicaid and disability applications are pending. She will need follow up therapies this time since she was not receiving any prior to admission. Mom currenlty has PNA and is staying at home to get well. Sw Barriers to Discharge: Decreased caregiver support, Insurance for SNF coverage Sw Barriers to Discharge Comments: Mom elderly and caring for pt's Dad with dementia. Disability and Medicaid applications pending Social Work Anticipated Follow Up Needs: HH/OP, Support Group  Clinical Impression Pleasant grimacing in pain female who is glad to be on the rehab unit and wants as much therapy as she  can get. She will need to push herself and push through the pain to accomplish her goal of walking at discharge. She feels she is a burden on her parents-especially Mom and wants to do for herself. But she will need to put her words into action. Will have neuro-psych see while here for coping and support. Mom is recovering from pneumonia, encouraged her to stay home and recover. Will work on safe discharge plan. Will not have an option to go to a NH due to just was in one 30 days ago and management will not give another LOG for her.  Lucy Chrisupree, Jamelle Noy G 06/24/2017, 12:26 PM

## 2017-06-24 NOTE — Progress Notes (Signed)
Physical Therapy Session Note  Patient Details  Name: Catherine Butler MRN: [id number] Date of Birth: Apr 06, 1961  Today's Date: 06/24/2017 PT Individual Time: 1600-1630 PT Individual Time Calculation (min): 30 min   Short Term Goals: Week 1:  PT Short Term Goal 1 (Week 1): Pt will transfer with min assist PT Short Term Goal 2 (Week 1): Pt will ambulate 66' with LRAD and mod assist PT Short Term Goal 3 (Week 1): Pt will initiate stair training with PT PT Short Term Goal 4 (Week 1): Pt will propel w/c 100' with supervision.   Skilled Therapeutic Interventions/Progress Updates:    no c/o pain but reports fatigue.  Session focus on transfers and activity tolerance.  Pt requesting to toilet.  Supine>sit with supervision and increased time.  Stand/pivot throughout sess (4 total) with steady assist.  Pt completes 3/3 toileting steps with assist to initiate clothing management prior to toileting.  Seated balance focus on lateral leans for hygiene while on BSC.  Following toileting pt positioned in front of sink for hand washing with assist to clean under nails.  Pt returned to bed at end of session and positioned with call bell in reach and needs met.   Therapy Documentation Precautions:  Precautions Precautions: Fall Restrictions Weight Bearing Restrictions: No   See Function Navigator for Current Functional Status.   Therapy/Group: Individual Therapy  [name] 06/24/2017, 5:05 PM

## 2017-06-24 NOTE — Progress Notes (Signed)
Patient information reviewed and entered into eRehab system by Tanza Pellot, RN, CRRN, PPS Coordinator.  Information including medical coding and functional independence measure will be reviewed and updated through discharge.    

## 2017-06-24 NOTE — Progress Notes (Signed)
Initial Nutrition Assessment  DOCUMENTATION CODES:   Not applicable  INTERVENTION:  Continue Ensure Enlive po BID, each supplement provides 350 kcal and 20 grams of protein.  Continue 30 ml Prostat po BID, each supplement provides 100 kcal and 15 grams of protein.   Encourage adequate PO intake.   NUTRITION DIAGNOSIS:   Inadequate oral intake related to poor appetite as evidenced by per patient/family report.  GOAL:   Patient will meet greater than or equal to 90% of their needs  MONITOR:   PO intake, Supplement acceptance, Labs, Weight trends, I & O's, Skin  REASON FOR ASSESSMENT:   Malnutrition Screening Tool    ASSESSMENT:   57 y.o. female with history of anxiety disorder, depression, alcohol abuse, COPD, [facility] with back pain, progressive BUE/BLE paraesthesias, difficulty with walking, bowel incontinence and urinary retention. Also with reports of recent flu vaccine and was treated with IVIG due to suspicion of GBS and discharged to SNF with recommendations of EMG by neurology on outpatient basis. She was discharged to home few weeks ago but [facility] with nausea/vomiting  X four day and weakness. CT abdomen done revealing hepatic steatosis, stigmata of chronic pancreatitis with pancreatitic calcifications, increased fecal retention and stable moderate HH.   Pt in pain during time of visit and unable to report much of nutrition history. Pt reports poor appetite and intake. Meal completion has been 25-50%. Pt reports poor appetite at home too however would still try to eat throughout the day. Per weight records, pt with a 12% weight loss in 3 months. RD unable to obtain information regarding weight history/reason for weight loss due to pt reporting pains. Pt currently has Ensure and Prostat ordered and has been consuming them. RD to continue with current orders.   NUTRITION - FOCUSED PHYSICAL EXAM:    Most Recent Value  Orbital  Region  Unable to assess  Upper Arm Region  No depletion  Thoracic and Lumbar Region  No depletion  Buccal Region  No depletion  Temple Region  No depletion  Clavicle Bone Region  No depletion  Clavicle and Acromion Bone Region  No depletion  Scapular Bone Region  No depletion  Dorsal Hand  Unable to assess  Patellar Region  Mild depletion  Anterior Thigh Region  Mild depletion  Posterior Calf Region  Mild depletion  Edema (RD Assessment)  None  Hair  Reviewed  Eyes  Reviewed  Mouth  Reviewed  Skin  Reviewed  Nails  Reviewed       Diet Order:  Diet regular Room service appropriate? Yes; Fluid consistency: Thin  EDUCATION NEEDS:   Not appropriate for education at this time  Skin:  Skin Assessment: Reviewed RN Assessment  Last BM:  12/31  Height:   Ht Readings from Last 1 Encounters:  06/23/17 5\' 3"  (1.6 m)    Weight:   Wt Readings from Last 1 Encounters:  06/24/17 149 lb 14.6 oz (68 kg)    Ideal Body Weight:  52.27 kg  BMI:  Body mass index is 26.56 kg/m.  Estimated Nutritional Needs:   Kcal:  1800-2000  Protein:  80-90 grams  Fluid:  1.8 - 2 L/day    Roslyn SmilingStephanie Dajia Gunnels, MS, RD, LDN Pager # [phone] After hours/ weekend pager # [phone]

## 2017-06-24 NOTE — Evaluation (Signed)
Physical Therapy Assessment and Plan  Patient Details  Name: Catherine Butler MRN: [id number] Date of Birth: 10/19/1960  PT Diagnosis: Abnormality of gait, Ataxia, Coordination disorder, Muscle weakness and Quadriplegia Rehab Potential: Fair ELOS: 14-18 days    Today's Date: 06/24/2017 PT Individual Time: 1000-1115 PT Individual Time Calculation (min): 75 min    Problem List:  Patient Active Problem List   Diagnosis Date Noted  . Debility 06/23/2017  . Constipation   . Quadriplegia and quadriparesis ([place])   . Guillain Barr syndrome ([place])   . Nausea & vomiting 06/21/2017  . Hypokalemia 06/21/2017  . Volume depletion 06/21/2017  . Sinus tachycardia 06/21/2017  . Nausea and vomiting 06/21/2017  . Essential hypertension 04/14/2017  . Paresthesia   . 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 Abdominal pain 01/01/2017  . Sepsis ([place]) 01/01/2017  . Tobacco abuse 01/01/2017  . Abnormal LFTs 01/01/2017  . Depression   . COPD (chronic obstructive pulmonary disease) ([place]) 06/16/2015    Past Medical History:  Past Medical History:  Diagnosis Date  . Anxiety   . Arthritis   . Asthma   . COPD (chronic obstructive pulmonary disease) ([place])   . Depression   . H/O alcohol abuse   . Hypertension   . Reflux   . Suicide attempt by multiple drug overdose ([place]) 2014   Past Surgical History:  Past Surgical History:  Procedure Laterality Date  . LAPAROSCOPY    . laporscopy surgery for ovarian cyst  20 years ago  . TONSILLECTOMY      Assessment & Plan Clinical Impression: A 57 y.o. female with history of anxiety disorder, depression, alcohol abuse, COPD, admission [facility] 10/22-10/31/18 with back pain, progressive BUE/BLE paraesthesias, difficulty with walking, bowel incontinence and urinary retention. Also with reports of recent flu vaccine and was treated with IVIG due to suspicion of GBS and discharged to SNF with recommendations of EMG by neurology on  outpatient basis. She was discharged to home recently but readmitted to [facility] on 06/20/17 with nausea/vomiting  X four day and weakness. CT abdomen done revealing hepatic steatosis, stigmata of chronic pancreatitis with pancreatitic calcifications, increased fecal retention and stable moderate HH. She was noted to have urinary retention with bladder volume 600cc   Electrolyte abnormality felt to be due to viral syndrome v/s food poisoning and treated with IVF. UDS negative. UA negative.  Therapy evaluations done showing diffuse weakness, poor safety and neuropathy affecting functional status.  CIR recommended for aggressive rehab program.    Patient transferred to CIR on 06/23/2017 .   Patient currently requires mod with mobility secondary to muscle weakness, impaired timing and sequencing, abnormal tone, unbalanced muscle activation, ataxia and decreased coordination,   and decreased sitting balance, decreased standing balance, decreased postural control and decreased balance strategies.  Prior to hospitalization, patient was min with mobility and lived with Family in a House home.  Home access is 3Stairs to enter.  Patient will benefit from skilled PT intervention to maximize safe functional mobility, minimize fall risk and decrease caregiver burden for planned discharge home with 24 hour supervision.  Anticipate patient will benefit from follow up [place] at discharge.  PT - End of Session Activity Tolerance: Tolerates < 10 min activity, no significant change in vital signs Endurance Deficit: Yes Endurance Deficit Description: requires multiple rest breaks and encouragement to continue throughout session PT Assessment Rehab Potential (ACUTE/IP ONLY): Fair PT Barriers to Discharge: Decreased caregiver support;Home environment access/layout;Behavior PT Patient demonstrates impairments  in the following area(s): Balance;Behavior;Endurance;Motor;Nutrition;Pain;Perception;Safety;Sensory PT Transfers Functional  Problem(s): Bed Mobility;Bed to Chair;Furniture;Floor;Car PT Locomotion Functional Problem(s): Ambulation;Stairs;Wheelchair Mobility PT Plan PT Intensity: Minimum of 1-2 x/day ,45 to 90 minutes PT Frequency: 5 out of 7 days PT Duration Estimated Length of Stay: 14-18 days  PT Treatment/Interventions: Ambulation/gait training;Community reintegration;DME/adaptive equipment instruction;Neuromuscular re-education;Psychosocial support;Stair training;UE/LE Strength taining/ROM;Wheelchair propulsion/positioning;UE/LE Coordination activities;Therapeutic Activities;Balance/vestibular training;Discharge planning;Functional electrical stimulation;Pain management;Cognitive remediation/compensation;Disease management/prevention;Functional mobility training;Patient/family education;Splinting/orthotics;Therapeutic Exercise;Visual/perceptual remediation/compensation PT Transfers Anticipated Outcome(s): supervision PT Locomotion Anticipated Outcome(s): supervision, short distance ambulatory, w/c community PT Recommendation Recommendations for Other Services: Neuropsych consult;Therapeutic Recreation consult Therapeutic Recreation Interventions: Pet therapy;Stress management Follow Up Recommendations: Home health PT;24 hour supervision/assistance Patient destination: Home Equipment Recommended: To be determined  Skilled Therapeutic Intervention No c/o pain.  PT provided education on rehab process, goals of therapy, plan of care, and ELOS.  Pt requires constant encouragement and redirection throughout session 2/2 pain.  Pt completes transfers with overall min assist with cues for hand placement.  Initiated gait training in // bars with mod to stand but min for gait, therapist blocking bilat knees.  Wrapped theraband around push rims on w/c to facilitate improved stroke.  Pt returned to room at end of session, positioned upright in w/c with call bell in reach and needs met.   PT  Evaluation Precautions/Restrictions Precautions Precautions: Fall Pain Pain Assessment Pain Assessment: 0-10 Pain Score: 10-Worst pain ever Pain Location: Generalized Pain Intervention(s): RN made aware;Repositioned;Ambulation/increased activity Home Living/Prior Functioning Home Living Living Arrangements: Parent Available Help at Discharge: Family;Available 24 hours/day Type of Home: House Home Access: Stairs to enter [employer] of Steps: 3 Entrance Stairs-Rails: None Home Layout: One level Bathroom Accessibility: Yes Additional Comments: reports using w/c immediately PTA at min assist level, unable to access bathroom in w/c  Lives With: Family Prior Function Level of Independence: Requires assistive device for independence Driving: No Vocation: Unemployed Comments: pt states she completes ADLs from bed level Vision/Perception  Perception Perception: Within Functional Limits Praxis Praxis: Intact  Cognition Overall Cognitive Status: Within Functional Limits for tasks assessed Arousal/Alertness: Awake/alert Orientation Level: Oriented X4 Attention: Selective Selective Attention: Appears intact Memory: Appears intact Awareness: Appears intact Problem Solving: Appears intact Behaviors: Poor frustration tolerance Safety/Judgment: Impaired Sensation Sensation Light Touch: Impaired by gross assessment Additional Comments: neuropathy in BLEs/BUEs baseline Coordination Gross Motor Movements are Fluid and Coordinated: No Fine Motor Movements are Fluid and Coordinated: No Coordination and Movement Description: ataxic, tremors Motor  Motor Motor: Tetraplegia;Ataxia Motor - Skilled Clinical Observations: diffuse weakness in UEs/LEs  Mobility Bed Mobility Bed Mobility: Supine to Sit Supine to Sit: 5: Supervision Supine to Sit Details (indicate cue type and reason): increased time Transfers Transfers: Yes Stand Pivot Transfers: 4: Min assist Stand Pivot  Transfer Details: Verbal cues for safe use of DME/AE;Verbal cues for precautions/safety Stand Pivot Transfer Details (indicate cue type and reason): pt essentially throws herself into her chair after standing up from the bed with min assist Locomotion  Ambulation Ambulation: Yes Ambulation/Gait Assistance: 4: Min assist Ambulation Distance (Feet): 10 Feet Assistive device: Parallel bars Stairs / Additional Locomotion Stairs: No Wheelchair Mobility Wheelchair Mobility: Yes Wheelchair Assistance: 5: Careers information officer: Both upper extremities Wheelchair Parts Management: Needs assistance Distance: 25  Trunk/Postural Assessment  Cervical Assessment Cervical Assessment: Within Functional Limits Thoracic Assessment Thoracic Assessment: Exceptions to WFL(mild kyphosis) Lumbar Assessment Lumbar Assessment: Within Functional Limits Postural Control Postural Control: Deficits on evaluation  Balance   Extremity Assessment      RLE Assessment RLE  Assessment: Exceptions to [facility] RLE Strength Right Hip Flexion: 3/5 Right Knee Flexion: 3+/5 Right Knee Extension: 3+/5 Right Ankle Dorsiflexion: 3+/5 Right Ankle Plantar Flexion: 3+/5 LLE Assessment LLE Assessment: Exceptions to [facility] LLE Strength Left Hip Flexion: 3/5 Left Knee Flexion: 3+/5 Left Knee Extension: 4/5 Left Ankle Dorsiflexion: 3+/5 Left Ankle Plantar Flexion: 3+/5   See Function Navigator for Current Functional Status.   Refer to Care Plan for Long Term Goals  Recommendations for other services: Neuropsych and Therapeutic Recreation  Pet therapy  Discharge Criteria: Patient will be discharged from PT if patient refuses treatment 3 consecutive times without medical reason, if treatment goals not met, if there is a change in medical status, if patient makes no progress towards goals or if patient is discharged from hospital.  The above assessment, treatment plan, treatment alternatives and goals were  discussed and mutually agreed upon: by patient  [name] 06/24/2017, 12:29 PM

## 2017-06-24 NOTE — Care Management Note (Signed)
Inpatient Rehabilitation Center Individual Statement of Services  Patient Name:  Catherine Butler  Date:  06/24/2017  Welcome to the Inpatient Rehabilitation Center.  Our goal is to provide you with an individualized program based on your diagnosis and situation, designed to meet your specific needs.  With this comprehensive rehabilitation program, you will be expected to participate in at least 3 hours of rehabilitation therapies Monday-Friday, with modified therapy programming on the weekends.  Your rehabilitation program will include the following services:  Physical Therapy (PT), Occupational Therapy (OT), 24 hour per day rehabilitation nursing, Therapeutic Recreaction (TR), Neuropsychology, Case Management (Social Worker), Rehabilitation Medicine, Nutrition Services and Pharmacy Services  Weekly team conferences will be held on Wednesday to discuss your progress.  Your Social Worker will talk with you frequently to get your input and to update you on team discussions.  Team conferences with you and your family in attendance may also be held.  Expected length of stay: 14-18 days  Overall anticipated outcome: supervision cueing  Depending on your progress and recovery, your program may change. Your Social Worker will coordinate services and will keep you informed of any changes. Your Social Worker's name and contact numbers are listed  below.  The following services may also be recommended but are not provided by the Inpatient Rehabilitation Center:   Driving Evaluations  Home Health Rehabiltiation Services  Outpatient Rehabilitation Services  Vocational Rehabilitation   Arrangements will be made to provide these services after discharge if needed.  Arrangements include referral to agencies that provide these services.  Your insurance has been verified to be:  Pending Medicaid Your primary doctor is:  Sindy MessingRoger Gomez  Pertinent information will be shared with your doctor and your insurance  company.  Social Worker:  Dossie DerBecky Johnnette Laux, SW [phone] or ([phone]) [phone]  Information discussed with and copy given to patient by: Lucy Chrisupree, Keshawn Fiorito G, 06/24/2017, 10:54 AM

## 2017-06-24 NOTE — Progress Notes (Signed)
Subjective/Complaints: Slept well Denies dysuria RN notes foul smelling urine Poor appetite  ROS-  Neg CP, SOB, N/V/D  Objective: Vital Signs: Blood pressure 113/75, pulse (!) 108, temperature 97.7 F (36.5 C), temperature source Oral, resp. rate 18, height '5\' 3"'$  (1.6 m), weight 68 kg (149 lb 14.6 oz), last menstrual period 02/22/2006, SpO2 100 %. Dg Abd Portable 1v  Result Date: 06/23/2017 CLINICAL DATA:  Constipation. EXAM: PORTABLE ABDOMEN - 1 VIEW COMPARISON:  06/20/2017 CT FINDINGS: A moderate to large amount of stool within the ascending and descending colon noted. Small amount of stool in the sigmoid colon and rectum noted. No dilated small bowel loops are present. No suspicious bony lesions are identified. IMPRESSION: Moderate to large amount of stool within the ascending and descending colon which can be seen with constipation. No evidence of bowel obstruction. Electronically Signed   By: Margarette Canada M.D.   On: 06/23/2017 20:24   Results for orders placed or performed during the hospital encounter of 06/23/17 (from the past 72 hour(s))  Urinalysis, Routine w reflex microscopic     Status: Abnormal   Collection Time: 06/23/17  3:19 PM  Result Value Ref Range   Color, Urine YELLOW YELLOW   APPearance CLEAR CLEAR   Specific Gravity, Urine 1.004 (L) 1.005 - 1.030   pH 6.0 5.0 - 8.0   Glucose, UA NEGATIVE NEGATIVE mg/dL   Hgb urine dipstick NEGATIVE NEGATIVE   Bilirubin Urine NEGATIVE NEGATIVE   Ketones, ur NEGATIVE NEGATIVE mg/dL   Protein, ur NEGATIVE NEGATIVE mg/dL   Nitrite NEGATIVE NEGATIVE   Leukocytes, UA SMALL (A) NEGATIVE   RBC / HPF 0-5 0 - 5 RBC/hpf   WBC, UA 6-30 0 - 5 WBC/hpf   Bacteria, UA MANY (A) NONE SEEN   Squamous Epithelial / LPF 0-5 (A) NONE SEEN   Mucus PRESENT   CBC WITH DIFFERENTIAL     Status: Abnormal   Collection Time: 06/24/17  5:09 AM  Result Value Ref Range   WBC 3.4 (L) 4.0 - 10.5 K/uL   RBC 3.54 (L) 3.87 - 5.11 MIL/uL   Hemoglobin 10.9 (L)  12.0 - 15.0 g/dL   HCT 31.9 (L) 36.0 - 46.0 %   MCV 90.1 78.0 - 100.0 fL   MCH 30.8 26.0 - 34.0 pg   MCHC 34.2 30.0 - 36.0 g/dL   RDW 13.3 11.5 - 15.5 %   Platelets 197 150 - 400 K/uL   Neutrophils Relative % 41 %   Neutro Abs 1.4 (L) 1.7 - 7.7 K/uL   Lymphocytes Relative 41 %   Lymphs Abs 1.4 0.7 - 4.0 K/uL   Monocytes Relative 15 %   Monocytes Absolute 0.5 0.1 - 1.0 K/uL   Eosinophils Relative 2 %   Eosinophils Absolute 0.1 0.0 - 0.7 K/uL   Basophils Relative 1 %   Basophils Absolute 0.0 0.0 - 0.1 K/uL  Comprehensive metabolic panel     Status: Abnormal   Collection Time: 06/24/17  5:09 AM  Result Value Ref Range   Sodium 133 (L) 135 - 145 mmol/L   Potassium 4.1 3.5 - 5.1 mmol/L   Chloride 108 101 - 111 mmol/L   CO2 18 (L) 22 - 32 mmol/L   Glucose, Bld 100 (H) 65 - 99 mg/dL   BUN <5 (L) 6 - 20 mg/dL   Creatinine, Ser 0.48 0.44 - 1.00 mg/dL   Calcium 8.2 (L) 8.9 - 10.3 mg/dL   Total Protein 4.8 (L) 6.5 - 8.1 g/dL  Albumin 1.9 (L) 3.5 - 5.0 g/dL   AST 24 15 - 41 U/L   ALT 16 14 - 54 U/L   Alkaline Phosphatase 97 38 - 126 U/L   Total Bilirubin 0.9 0.3 - 1.2 mg/dL   GFR calc non Af Amer >60 >60 mL/min   GFR calc Af Amer >60 >60 mL/min    Comment: (NOTE) The eGFR has been calculated using the CKD EPI equation. This calculation has not been validated in all clinical situations. eGFR's persistently <60 mL/min signify possible Chronic Kidney Disease.    Anion gap 7 5 - 15    Head:Normocephalicand atraumatic.  Mouth/Throat:Oropharynx is clear and moist. Multiple missing teeth. Eyes:Conjunctivaeare normal. Pupils are equal, round, and reactive to light.  Neck:Normal range of motion.Neck supple.  Cardiovascular:Regular rhythm.Tachycardiapresent.  Respiratory:Effort normaland breath sounds normal. Nostridor. Norespiratory distress. She hasno wheezes.  QQ:IWLN.Bowel sounds are normal.  Musculoskeletal: She exhibits noedema. BLE hypersensitive,  dysesthetic Neurological: She isalertand oriented to person, place, and time.Coordinationnormal.  Speech clear. Anxious. BUE with decreased motor control and distal weakness. Skin: Skin iswarmand dry. She isnot diaphoretic.  Psychiatric: [name] is normal. Her mood appearsanxious. She expressesinappropriate judgment. She isinattentive. Mild ataxia right finger-nose-finger and left finger-nose-finger Moderate ataxia bilateral heel to shin Sensation reduced to proprioception left great toe but intact in the right great toe light touch is intact bilaterally in upper and lower limbs. Motor strength is 4/5 bilateral deltoid bicep tricep finger flexors and extensors 3/5 bilateral hip flexor knee extensor ankle dorsiflexor plantar flexor      Assessment/Plan: 1. Functional deficits secondary to quadriparesis which require 3+ hours per day of interdisciplinary therapy in a comprehensive inpatient rehab setting. Physiatrist is providing close team supervision and 24 hour management of active medical problems listed below. Physiatrist and rehab team continue to assess barriers to discharge/monitor patient progress toward functional and medical goals. FIM:       Function - Toileting Toileting steps completed by patient: Performs perineal hygiene Toileting steps completed by helper: Adjust clothing prior to toileting, Adjust clothing after toileting           Function - Comprehension Comprehension: Auditory Comprehension assist level: Follows complex conversation/direction with no assist  Function - Expression Expression: Verbal Expression assist level: Expresses complex ideas: With no assist  Function - Social Interaction Social Interaction assist level: Interacts appropriately with others with medication or extra time (anti-anxiety, antidepressant).  Function - Problem Solving Problem solving assist level: Solves complex problems: With extra time  Function -  Memory Memory assist level: Complete Independence: No helper Patient normally able to recall (first 3 days only): Current season, Location of own room, Staff names and faces, That he or she is in a hospital   Medical Problem List and Plan: 1.   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 DVT Prophylaxis/Anticoagulation: Pharmaceutical:Lovenox 3. Pain Management:will likely needs gabapentin titrated upwards. Ultram and/or tylenol prn 4. Mood:LCSW to provide ego support. LCSW to follow for evaluation and support. 5. Neuropsych: This patientiscapable of making decisions on herown behalf. 6. Skin/Wound Care:routine pressure relief measurs 7. Fluids/Electrolytes/Nutrition:Recheck lytes in am. Hyponatremia/hypokalemia resolved.  8. ABLA: Recheck labs in am. 9. H/o Constipation: CT with evidence of constipation. Hasn't had BM yest. Will schedule miralax daily.  10. Urinary retention: Check UA/UCS. Monitor voiding with PVR checks.+bact and WBC no dysuria, will check cx  11. Anxiety disorder: Continue Remeron and Seroquel at bedtime. Tachycardia/elevated BP due to pain and anxiety at admission. Recheck  vitals.  Sees Dr. Sabra Heck at behavioral health recommend neuropsych eval 12. Neuropathy: continue Gabapentin 400 mg qid.No pain c/o this am 13.  History of alcohol abuse has CT abdomen evidence of fatty liver plus chronic pancreatitis.  Patient states that she last drank heavily a couple years ago, with lighter alcohol use last around 3 or 4 months ago 14.  Tobacco abuse will start NicoDerm patchon 1/4 15.  Sinus tachycardia, mild asymptomatic no tx for now   LOS (Days) 1 A FACE TO FACE EVALUATION WAS PERFORMED  Charlett Blake 06/24/2017, 8:44 AM

## 2017-06-24 NOTE — Progress Notes (Signed)
Physical Therapy Session Note  Patient Details  Name: Catherine Butler MRN: [id number] Date of Birth: 01/19/1961  Today's Date: 06/24/2017 PT Individual Time: 1420-1445 PT Individual Time Calculation (min): 25 min   Short Term Goals: Week 1:  PT Short Term Goal 1 (Week 1): Pt will transfer with min assist PT Short Term Goal 2 (Week 1): Pt will ambulate 5130' with LRAD and mod assist PT Short Term Goal 3 (Week 1): Pt will initiate stair training with PT PT Short Term Goal 4 (Week 1): Pt will propel w/c 100' with supervision.   Skilled Therapeutic Interventions/Progress Updates:   pt c/o fatigue and requires encouragement for participation in session but eventually agreeable. Mod assist for stand pivot transfers throughout session with cues for technique and assist for power up and eccentric control to sit; pt with tendency for posterior lean in standing. Simulated car transfer with mod assist in similar manner as described above with cues for safer technique (sitting on seat instead of stepping into the car) due to increased fall risk. Pt also reports her son just lifts her in/out of the car. Encouraged pt that the goal of therapy is to increase her independence. Pt verbalized understanding. W/c mobility x 110' with supervision and encouraging cues (pt reports her hand pain is limiting). Returned to bed end of session in manner as described above with all needs in reach.   Therapy Documentation Precautions:  Precautions Precautions: Fall Restrictions Weight Bearing Restrictions: No  Pain: Pain Assessment Pain Assessment: 0-10 Pain Score: 8  Pain Type: Chronic pain Pain Location: Generalized Pain Orientation: Right;Left Pain Descriptors / Indicators: Aching Pain Onset: On-going Pain Intervention(s): Repositioned  See Function Navigator for Current Functional Status.   Therapy/Group: Individual Therapy  Karolee StampsGray, Garland Smouse Darrol PokeBrescia  [name] B. Golda Zavalza, PT, DPT  06/24/2017, 2:50 PM

## 2017-06-25 ENCOUNTER — Inpatient Hospital Stay (HOSPITAL_COMMUNITY): Payer: Self-pay | Admitting: Physical Therapy

## 2017-06-25 ENCOUNTER — Inpatient Hospital Stay (HOSPITAL_COMMUNITY): Payer: Medicaid Other | Admitting: Occupational Therapy

## 2017-06-25 LAB — URINE CULTURE

## 2017-06-25 LAB — CULTURE, BLOOD (ROUTINE X 2)
CULTURE: NO GROWTH
Culture: NO GROWTH
Special Requests: ADEQUATE
Special Requests: ADEQUATE

## 2017-06-25 MED ORDER — GABAPENTIN 300 MG PO CAPS
600.0000 mg | ORAL_CAPSULE | Freq: Four times a day (QID) | ORAL | Status: DC
  Administered 2017-06-25 – 2017-06-28 (×15): 600 mg via ORAL
  Filled 2017-06-25 (×15): qty 2

## 2017-06-25 MED ORDER — SENNOSIDES-DOCUSATE SODIUM 8.6-50 MG PO TABS
2.0000 | ORAL_TABLET | Freq: Two times a day (BID) | ORAL | Status: DC
  Administered 2017-06-25 – 2017-07-06 (×18): 2 via ORAL
  Filled 2017-06-25 (×23): qty 2

## 2017-06-25 MED ORDER — CEPHALEXIN 250 MG PO CAPS
250.0000 mg | ORAL_CAPSULE | Freq: Three times a day (TID) | ORAL | Status: DC
  Administered 2017-06-25 – 2017-07-03 (×24): 250 mg via ORAL
  Filled 2017-06-25 (×24): qty 1

## 2017-06-25 NOTE — Plan of Care (Signed)
  Progressing RH BLADDER ELIMINATION RH STG MANAGE BLADDER WITH ASSISTANCE Description STG Manage Bladder With mod Assistance  06/25/2017 0016 - Progressing by Sissy HoffBarber, Britany Callicott M, RN RH SKIN INTEGRITY RH STG SKIN FREE OF INFECTION/BREAKDOWN Description Free from skin breakdown entire stay in rehab  06/25/2017 0016 - Progressing by Sissy HoffBarber, Hoyt Leanos M, RN RH STG MAINTAIN SKIN INTEGRITY WITH ASSISTANCE Description STG Maintain Skin Integrity With Mod Assistance.  06/25/2017 0016 - Progressing by Sissy HoffBarber, Worthy Boschert M, RN RH SAFETY RH STG ADHERE TO SAFETY PRECAUTIONS W/ASSISTANCE/DEVICE Description STG Adhere to Safety Precautions With mod Assistance/Device.  06/25/2017 0016 - Progressing by Sissy HoffBarber, Carrie Usery M, RN

## 2017-06-25 NOTE — Progress Notes (Signed)
Occupational Therapy Session Note  Patient Details  Name: Catherine Butler D Berne MRN: [id number] Date of Birth: 03/05/1961  Today's Date: 06/25/2017 OT Individual Time:  - 0900-1000  (60 min)      Short Term Goals: Week 1:  OT Short Term Goal 1 (Week 1): Pt will complete toilet transfe with min A OT Short Term Goal 2 (Week 1): Pt will tolerate standing for 1 minute while participating in BADL task OT Short Term Goal 3 (Week 1): Pt will complete 1/3 steps of toileting task OT Short Term Goal 4 (Week 1): Pt will complete tub bench transfer with min A Week 2:     Skilled Therapeutic Interventions/Progress Updates:   1st session:   8/10  pain in hands  also reports fatigue.  Session focus on transfers and activity tolerance.  Pt requesting to toilet.  Supine>sit with supervision and increased time.  Stand/pivot throughout session (4 total) with mod assist assist.  Pt completes 1/3 toileting steps with assist to don/doff  depends in standing.    Pt stood x3 for 30 sec to 1min with mod assist for balance and left leg support.   Pt transferred back to bed and left with all items in reach.          Therapy Documentation Precautions:  Precautions Precautions: Fall Restrictions Weight Bearing Restrictions: No       Pain:   8/10 neuropathic pain   ADL: ADL ADL Comments: Please see functional navigator              See Function Navigator for Current Functional Status.   Therapy/Group: Individual Therapy  Humberto Sealsdwards, Roena Sassaman J 06/25/2017, 10:03 AM

## 2017-06-25 NOTE — Progress Notes (Signed)
Occupational Therapy Session Note  Patient Details  Name: Catherine Butler MRN: [id number] Date of Birth: 02/12/1961  Today's Date: 06/25/2017 OT Individual Time:  - 1415-1500  (45 min)      Short Term Goals: Week 1:  OT Short Term Goal 1 (Week 1): Pt will complete toilet transfe with min A OT Short Term Goal 2 (Week 1): Pt will tolerate standing for 1 minute while participating in BADL task OT Short Term Goal 3 (Week 1): Pt will complete 1/3 steps of toileting task OT Short Term Goal 4 (Week 1): Pt will complete tub bench transfer with min A Week 2:     Skilled Therapeutic Interventions/Progress Updates:  Ppt lying in bed.  Agreed to sit EOB and do UE exercises, but not do a shower.  She stated she was too tired.  Performed exercises for UE and sitting balance.  Left pt a handout to use after discharge.  Pt requesting toileting at end of session.  Transferred with  Mod assist toilet.  Pt transferred back to bed and left with all needs in reach.  .  Therapy Documentation Precautions:  Precautions Precautions: Fall Restrictions Weight Bearing Restrictions: No General:   Vital Signs: Therapy Vitals Temp: 98.3 F (36.8 C) Temp Source: Oral Pulse Rate: (!) 116 Resp: 18 BP: 118/82 Patient Position (if appropriate): Lying Oxygen Therapy SpO2: 100 % O2 Device: Not Delivered Pain:   6/10 overall but worse in hands   ADL: ADL ADL Comments: Please see functional navigator             See Function Navigator for Current Functional Status.   Therapy/Group: Individual Therapy  Humberto Sealsdwards, Naomy Esham J 06/25/2017, 3:01 PM

## 2017-06-25 NOTE — Progress Notes (Signed)
Physical Therapy Session Note  Patient Details  Name: BREALYNN CONTINO MRN: [id number] Date of Birth: [birth year]  Today's Date: 06/25/2017 PT Individual Time: 1100-1200 PT Individual Time Calculation (min): 60 min   Short Term Goals: Week 1:  PT Short Term Goal 1 (Week 1): Pt will transfer with min assist PT Short Term Goal 2 (Week 1): Pt will ambulate 77' with LRAD and mod assist PT Short Term Goal 3 (Week 1): Pt will initiate stair training with PT PT Short Term Goal 4 (Week 1): Pt will propel w/c 100' with supervision.   Skilled Therapeutic Interventions/Progress Updates:    no c/o pain.  Session focus on activity tolerance and gait training.   Pt transitions to EOB with increased time and supervision.  Squat/pivot to w/c with min assist, but poor safety awareness with uncontrolled aim/descent.  PT provided education throughout session regarding safety with transfers with minimal carryover noted.    Nustep 3x3 minutes for activity tolerance, pt requires frequent encouragement to continue.  Gait training x15' +18' +30' with RW and overall mod assist with mod cues for step length, walker proximity and safety.  Pt propelled w/c back to room with BUEs, cues for path finding.  Pt requires encouragement to stay up in w/c.  Positioned with call bell in reach and needs met.    Therapy Documentation Precautions:  Precautions Precautions: Fall Restrictions Weight Bearing Restrictions: No   See Function Navigator for Current Functional Status.   Therapy/Group: Individual Therapy  [name] 06/25/2017, 12:52 PM

## 2017-06-25 NOTE — Progress Notes (Signed)
Physical Therapy Session Note  Patient Details  Name: Catherine Butler MRN: [id number] Date of Birth: 09/11/1960  Today's Date: 06/25/2017 PT Individual Time: 1530-1600 PT Individual Time Calculation (min): 30 min   Short Term Goals: Week 1:  PT Short Term Goal 1 (Week 1): Pt will transfer with min assist PT Short Term Goal 2 (Week 1): Pt will ambulate 30' with LRAD and mod assist PT Short Term Goal 3 (Week 1): Pt will initiate stair training with PT PT Short Term Goal 4 (Week 1): Pt will propel w/c 100' with supervision.   Skilled Therapeutic Interventions/Progress Updates:    c/o neuropathic pain as earlier, but does not rate.  Session focus on activity tolerance and w/c mobility.  Pt requires increased time for supine>sit and min guard for stand/pivot to w/c on R.  W/C propulsion x400' up/down hill and through doorways with min assist to control descent downhill/through doorway as pt did not wait for door to fully open.  Pt returned to room at end of session and positioned back in bed with call bell in reach and needs met.   Therapy Documentation Precautions:  Precautions Precautions: Fall Restrictions Weight Bearing Restrictions: No General:   Vital Signs: Therapy Vitals Temp: 98.3 F (36.8 C) Temp Source: Oral Pulse Rate: (!) 116 Resp: 18 BP: 118/82 Patient Position (if appropriate): Lying Oxygen Therapy SpO2: 100 % O2 Device: Not Delivered Pain:   Mobility:   Locomotion :    Trunk/Postural Assessment :    Balance:   Exercises:   Other Treatments:     See Function Navigator for Current Functional Status.   Therapy/Group: Individual Therapy   E  06/25/2017, 4:06 PM  

## 2017-06-25 NOTE — Progress Notes (Signed)
Subjective/Complaints:  C/o nerve pain in hands and feet fairly constant, also with mild abd pain No BM x 5d  ROS-  Neg CP, SOB, N/V/D  Objective: Vital Signs: Blood pressure 114/75, pulse 100, temperature 98.1 F (36.7 C), temperature source Oral, resp. rate 18, height 5' 3" (1.6 m), weight 70.3 kg (154 lb 15.7 oz), last menstrual period 02/22/2006, SpO2 98 %. Dg Abd Portable 1v  Result Date: 06/23/2017 CLINICAL DATA:  Constipation. EXAM: PORTABLE ABDOMEN - 1 VIEW COMPARISON:  06/20/2017 CT FINDINGS: A moderate to large amount of stool within the ascending and descending colon noted. Small amount of stool in the sigmoid colon and rectum noted. No dilated small bowel loops are present. No suspicious bony lesions are identified. IMPRESSION: Moderate to large amount of stool within the ascending and descending colon which can be seen with constipation. No evidence of bowel obstruction. Electronically Signed   By: Margarette Canada M.D.   On: 06/23/2017 20:24   Results for orders placed or performed during the hospital encounter of 06/23/17 (from the past 72 hour(s))  Urinalysis, Routine w reflex microscopic     Status: Abnormal   Collection Time: 06/23/17  3:19 PM  Result Value Ref Range   Color, Urine YELLOW YELLOW   APPearance CLEAR CLEAR   Specific Gravity, Urine 1.004 (L) 1.005 - 1.030   pH 6.0 5.0 - 8.0   Glucose, UA NEGATIVE NEGATIVE mg/dL   Hgb urine dipstick NEGATIVE NEGATIVE   Bilirubin Urine NEGATIVE NEGATIVE   Ketones, ur NEGATIVE NEGATIVE mg/dL   Protein, ur NEGATIVE NEGATIVE mg/dL   Nitrite NEGATIVE NEGATIVE   Leukocytes, UA SMALL (A) NEGATIVE   RBC / HPF 0-5 0 - 5 RBC/hpf   WBC, UA 6-30 0 - 5 WBC/hpf   Bacteria, UA MANY (A) NONE SEEN   Squamous Epithelial / LPF 0-5 (A) NONE SEEN   Mucus PRESENT   Urine culture     Status: Abnormal   Collection Time: 06/23/17  3:19 PM  Result Value Ref Range   Specimen Description URINE, RANDOM    Special Requests NONE    Culture  >=100,000 COLONIES/mL ESCHERICHIA COLI (A)    Report Status 06/25/2017 FINAL    Organism ID, Bacteria ESCHERICHIA COLI (A)       Susceptibility   Escherichia coli - MIC*    AMPICILLIN <=2 SENSITIVE Sensitive     CEFAZOLIN <=4 SENSITIVE Sensitive     CEFTRIAXONE <=1 SENSITIVE Sensitive     CIPROFLOXACIN <=0.25 SENSITIVE Sensitive     GENTAMICIN <=1 SENSITIVE Sensitive     IMIPENEM <=0.25 SENSITIVE Sensitive     NITROFURANTOIN <=16 SENSITIVE Sensitive     TRIMETH/SULFA <=20 SENSITIVE Sensitive     AMPICILLIN/SULBACTAM <=2 SENSITIVE Sensitive     PIP/TAZO <=4 SENSITIVE Sensitive     Extended ESBL NEGATIVE Sensitive     * >=100,000 COLONIES/mL ESCHERICHIA COLI  CBC WITH DIFFERENTIAL     Status: Abnormal   Collection Time: 06/24/17  5:09 AM  Result Value Ref Range   WBC 3.4 (L) 4.0 - 10.5 K/uL   RBC 3.54 (L) 3.87 - 5.11 MIL/uL   Hemoglobin 10.9 (L) 12.0 - 15.0 g/dL   HCT 31.9 (L) 36.0 - 46.0 %   MCV 90.1 78.0 - 100.0 fL   MCH 30.8 26.0 - 34.0 pg   MCHC 34.2 30.0 - 36.0 g/dL   RDW 13.3 11.5 - 15.5 %   Platelets 197 150 - 400 K/uL   Neutrophils Relative % 41 %  Neutro Abs 1.4 (L) 1.7 - 7.7 K/uL   Lymphocytes Relative 41 %   Lymphs Abs 1.4 0.7 - 4.0 K/uL   Monocytes Relative 15 %   Monocytes Absolute 0.5 0.1 - 1.0 K/uL   Eosinophils Relative 2 %   Eosinophils Absolute 0.1 0.0 - 0.7 K/uL   Basophils Relative 1 %   Basophils Absolute 0.0 0.0 - 0.1 K/uL  Comprehensive metabolic panel     Status: Abnormal   Collection Time: 06/24/17  5:09 AM  Result Value Ref Range   Sodium 133 (L) 135 - 145 mmol/L   Potassium 4.1 3.5 - 5.1 mmol/L   Chloride 108 101 - 111 mmol/L   CO2 18 (L) 22 - 32 mmol/L   Glucose, Bld 100 (H) 65 - 99 mg/dL   BUN <5 (L) 6 - 20 mg/dL   Creatinine, Ser 0.48 0.44 - 1.00 mg/dL   Calcium 8.2 (L) 8.9 - 10.3 mg/dL   Total Protein 4.8 (L) 6.5 - 8.1 g/dL   Albumin 1.9 (L) 3.5 - 5.0 g/dL   AST 24 15 - 41 U/L   ALT 16 14 - 54 U/L   Alkaline Phosphatase 97 38 - 126  U/L   Total Bilirubin 0.9 0.3 - 1.2 mg/dL   GFR calc non Af Amer >60 >60 mL/min   GFR calc Af Amer >60 >60 mL/min    Comment: (NOTE) The eGFR has been calculated using the CKD EPI equation. This calculation has not been validated in all clinical situations. eGFR's persistently <60 mL/min signify possible Chronic Kidney Disease.    Anion gap 7 5 - 15    Head:Normocephalicand atraumatic.  Mouth/Throat:Oropharynx is clear and moist. Multiple missing teeth. Eyes:Conjunctivaeare normal. Pupils are equal, round, and reactive to light.  Neck:Normal range of motion.Neck supple.  Cardiovascular:Regular rhythm.Tachycardiapresent.  Respiratory:Effort normaland breath sounds normal. Nostridor. Norespiratory distress. She hasno wheezes.  GI:Soft.Bowel sounds are normal.  Musculoskeletal: She exhibits noedema. BLE hypersensitive, dysesthetic Neurological: She isalertand oriented to person, place, and time.Coordinationnormal.  Speech clear. Anxious. BUE with decreased motor control and distal weakness. Skin: Skin iswarmand dry. She isnot diaphoretic.  Psychiatric: [name] is normal. Her mood appearsanxious. She expressesinappropriate judgment. She isinattentive. Mild ataxia right finger-nose-finger and left finger-nose-finger Moderate ataxia bilateral heel to shin Sensation reduced to proprioception left great toe but intact in the right great toe light touch is intact bilaterally in upper and lower limbs. Motor strength is 4/5 bilateral deltoid bicep tricep finger flexors and extensors 3/5 bilateral hip flexor knee extensor ankle dorsiflexor plantar flexor      Assessment/Plan: 1. Functional deficits secondary to quadriparesis which require 3+ hours per day of interdisciplinary therapy in a comprehensive inpatient rehab setting. Physiatrist is providing close team supervision and 24 hour management of active medical problems listed below. Physiatrist  and rehab team continue to assess barriers to discharge/monitor patient progress toward functional and medical goals. FIM: Function - Bathing Bathing activity did not occur: Refused  Function- Upper Body Dressing/Undressing What is the patient wearing?: Pull over shirt/dress Pull over shirt/dress - Perfomed by patient: Thread/unthread right sleeve, Thread/unthread left sleeve Pull over shirt/dress - Perfomed by helper: Put head through opening, Pull shirt over trunk Assist Level: Touching or steadying assistance(Pt > 75%) Function - Lower Body Dressing/Undressing What is the patient wearing?: Pants, Non-skid slipper socks Position: Sitting EOB Pants- Performed by helper: Thread/unthread right pants leg, Thread/unthread left pants leg, Pull pants up/down Non-skid slipper socks- Performed by helper: Don/doff right sock, Don/doff left sock   Assist for footwear: Dependant Assist for lower body dressing: Touching or steadying assistance (Pt > 75%)  Function - Toileting Toileting activity did not occur: No continent bowel/bladder event Toileting steps completed by patient: (P) Performs perineal hygiene Toileting steps completed by helper: (P) Adjust clothing prior to toileting, Adjust clothing after toileting Toileting Assistive Devices: (P) Grab bar or rail Assist level: (P) Touching or steadying assistance (Pt.75%)  Function - Toilet Transfers Toilet transfer activity did not occur: Refused Toilet transfer assistive device: Elevated toilet seat/BSC over toilet, Grab bar Assist level to toilet: Touching or steadying assistance (Pt > 75%) Assist level from toilet: Touching or steadying assistance (Pt > 75%)  Function - Chair/bed transfer Chair/bed transfer method: Stand pivot Chair/bed transfer assist level: Moderate assist (Pt 50 - 74%/lift or lower) Chair/bed transfer assistive device: Armrests Chair/bed transfer details: Verbal cues for technique, Verbal cues for  precautions/safety  Function - Locomotion: Wheelchair Type: Manual Max wheelchair distance: 110' Assist Level: Supervision or verbal cues Wheel 50 feet with 2 turns activity did not occur: Refused Assist Level: Supervision or verbal cues Wheel 150 feet activity did not occur: Refused Turns around,maneuvers to table,bed, and toilet,negotiates 3% grade,maneuvers on rugs and over doorsills: No Function - Locomotion: Ambulation Assistive device: Parallel bars Max distance: 10 Assist level: Touching or steadying assistance (Pt > 75%) Assist level: Touching or steadying assistance (Pt > 75%) Walk 50 feet with 2 turns activity did not occur: Safety/medical concerns Walk 150 feet activity did not occur: Safety/medical concerns Walk 10 feet on uneven surfaces activity did not occur: Safety/medical concerns  Function - Comprehension Comprehension: Auditory Comprehension assist level: Follows complex conversation/direction with no assist  Function - Expression Expression: Verbal Expression assist level: Expresses complex ideas: With no assist  Function - Social Interaction Social Interaction assist level: Interacts appropriately with others with medication or extra time (anti-anxiety, antidepressant).  Function - Problem Solving Problem solving assist level: Solves complex problems: With extra time  Function - Memory Memory assist level: Complete Independence: No helper Patient normally able to recall (first 3 days only): Current season, Location of own room, Staff names and faces, That he or she is in a hospital   Medical Problem List and Plan: 1.   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 DVT Prophylaxis/Anticoagulation: Pharmaceutical:Lovenox 3. Pain Management:will likely needs gabapentin titrated upwards. Ultram and/or tylenol prn 4. Mood:LCSW to provide ego support. LCSW to follow for evaluation and support. 5. Neuropsych: This  patientiscapable of making decisions on herown behalf. 6. Skin/Wound Care:routine pressure relief measurs 7. Fluids/Electrolytes/Nutrition:Recheck lytes in am. Hyponatremia/hypokalemia resolved.  8. ABLA: Recheck labs in am. 9. H/o Constipation: CT with evidence of constipation. Hasn't had BM yest. Will schedule miralax daily.  10. Urinary retention and UTI. Monitor voiding with PVR checks.>100K ecoli S to cefazolin                  11. Anxiety disorder: Continue Remeron and Seroquel at bedtime. Tachycardia/elevated BP due to pain and anxiety at admission. Recheck vitals.  Sees Dr. Lugo at behavioral health recommend neuropsych eval 12. Neuropathy: continue Gabapentin 400 mg qid.No pain c/o this am 13.  History of alcohol abuse has CT abdomen evidence of fatty liver plus chronic pancreatitis.  Patient states that she last drank heavily a couple years ago, with lighter alcohol use last around 3 or 4 months ago 14.  Tobacco abuse will start NicoDerm patchon 1/4 15.  Sinus tachycardia, mild asymptomatic no tx for   now 35.  Constipation   LOS (Days) 2 A FACE TO FACE EVALUATION WAS PERFORMED  Charlett Blake 06/25/2017, 9:48 AM

## 2017-06-25 NOTE — IPOC Note (Signed)
Overall Plan of Care [facility](IPOC) Patient Details Name: Catherine Butler MRN: [id number] DOB: 10/03/1960  Admitting Diagnosis: <principal problem not specified>  Hospital Problems: Active Problems:   Debility   Constipation   Quadriplegia and quadriparesis (HCC)   Guillain Barr syndrome (HCC)     Functional Problem List: Nursing Behavior, Bladder, Bowel, Endurance, Medication Management, Motor, Pain, Perception, Safety, Sensory  PT Balance, Behavior, Endurance, Motor, Nutrition, Pain, Perception, Safety, Sensory  OT Balance, Behavior, Endurance, Motor, Pain, Sensory  SLP    TR         Basic ADL's: OT Eating, Grooming, Bathing, Dressing, Toileting     Advanced  ADL's: OT       Transfers: PT Bed Mobility, Bed to Chair, Furniture, Floor, Customer service managerCar  OT Toilet, Research scientist (life sciences)Tub/Shower     Locomotion: PT Ambulation, Stairs, Psychologist, prison and probation servicesWheelchair Mobility     Additional Impairments: OT None  SLP        TR      Anticipated Outcomes Item Anticipated Outcome  Self Feeding Mod I  Swallowing      Basic self-care  Actorupervision  Toileting  Supervision    Bathroom Transfers Supervision  Bowel/Bladder  mod I  Transfers  supervision  Locomotion  supervision, short distance ambulatory, w/c community  Communication     Cognition     Pain  less than 2  Safety/Judgment  mod I   Therapy Plan: PT Intensity: Minimum of 1-2 x/day ,45 to 90 minutes PT Frequency: 5 out of 7 days PT Duration Estimated Length of Stay: 14-18 days  OT Intensity: Minimum of 1-2 x/day, 45 to 90 minutes OT Frequency: 5 out of 7 days OT Duration/Estimated Length of Stay: 14-18 days      Team Interventions: Nursing Interventions Patient/Family Education, Bowel Management, Pain Management, Psychosocial Support, Bladder Management, Disease Management/Prevention, Medication Management, Cognitive Remediation/Compensation, Discharge Planning  PT interventions Ambulation/gait training, Community reintegration, DME/adaptive equipment  instruction, Neuromuscular re-education, Psychosocial support, Stair training, UE/LE Strength taining/ROM, Wheelchair propulsion/positioning, UE/LE Coordination activities, Therapeutic Activities, Warden/rangerBalance/vestibular training, Discharge planning, Functional electrical stimulation, Pain management, Cognitive remediation/compensation, Disease management/prevention, Functional mobility training, Patient/family education, Splinting/orthotics, Therapeutic Exercise, Visual/perceptual remediation/compensation  OT Interventions Community reintegration, Discharge planning, Warden/rangerBalance/vestibular training, DME/adaptive equipment instruction, Functional mobility training, Pain management, Patient/family education, Self Care/advanced ADL retraining, Therapeutic Activities, Therapeutic Exercise, UE/LE Strength taining/ROM, UE/LE Coordination activities, Wheelchair propulsion/positioning  SLP Interventions    TR Interventions    SW/CM Interventions Discharge Planning, Psychosocial Support, Patient/Family Education   Barriers to Discharge MD  Medical stability and severe anxiety  Nursing Lack of/limited family support    PT Decreased caregiver support, Home environment access/layout, Behavior    OT      SLP      SW Decreased caregiver support, Insurance for SNF coverage Mom elderly and caring for pt's Dad with dementia. Disability and Medicaid applications pending   Team Discharge Planning: Destination: PT-Home ,OT- Home , SLP-  Projected Follow-up: PT-Home health PT, 24 hour supervision/assistance, OT-  Home health OT, SLP-  Projected Equipment Needs: PT-To be determined, OT- To be determined, SLP-  Equipment Details: PT- , OT-Likely a tub transfer bench Patient/family involved in discharge planning: PT- Patient,  OT-Patient, SLP-   MD ELOS: 12-16d Medical Rehab Prognosis:  Good Assessment:  57 y.o.femalewith history of anxiety disorder, depression,alcohol abuse,COPD, [facility] with  back pain, progressive BUE/BLE paraesthesias, difficulty with walking, bowel incontinence and urinary retention. Also with reports of recent flu vaccine and was treated with IVIG due to suspicion of GBS  and discharged to SNF with recommendations of EMG by neurology on outpatient basis. She was discharged to homefew weeks agobut readmitted on [facility] on 06/20/17 with nausea/vomiting X four day and weakness. CT abdomen done revealing hepatic steatosis, stigmata of chronic pancreatitis with pancreatitic calcifications, increased fecal retention and stable moderate HH. She was noted to have urinary retention with bladder volume 600cc  Electrolyte abnormality felt to be due to viral syndrome v/s food poisoning and treated with IVF. UDS negative. UA negative.Therapy evaluations done showing diffuse weakness, poor safety and neuropathy affecting functional status  Now requiring 24/7 Rehab RN,MD, as well as CIR level PT, OT and SLP.  Treatment team will focus on ADLs and mobility with goals set at Sup/Mod I     See Team Conference Notes for weekly updates to the plan of care

## 2017-06-26 NOTE — Plan of Care (Signed)
  Progressing RH BOWEL ELIMINATION RH STG MANAGE BOWEL WITH ASSISTANCE Description STG Manage Bowel with mod Assistance.  06/26/2017 2308 - Progressing by Sissy HoffBarber, Sheila Ocasio M, RN RH BLADDER ELIMINATION RH STG MANAGE BLADDER WITH ASSISTANCE Description STG Manage Bladder With mod Assistance  06/26/2017 2308 - Progressing by Sissy HoffBarber, Nivek Powley M, RN RH SKIN INTEGRITY RH STG SKIN FREE OF INFECTION/BREAKDOWN Description Free from skin breakdown entire stay in rehab  06/26/2017 2308 - Progressing by Sissy HoffBarber, Breck Hollinger M, RN RH STG MAINTAIN SKIN INTEGRITY WITH ASSISTANCE Description STG Maintain Skin Integrity With Mod Assistance.  06/26/2017 2308 - Progressing by Sissy HoffBarber, Tammela Bales M, RN RH SAFETY RH STG ADHERE TO SAFETY PRECAUTIONS W/ASSISTANCE/DEVICE Description STG Adhere to Safety Precautions With mod Assistance/Device.  06/26/2017 2308 - Progressing by Sissy HoffBarber, Dillinger Aston M, RN RH PAIN MANAGEMENT RH STG PAIN MANAGED AT OR BELOW PT'S PAIN GOAL Description Less than 2  06/26/2017 2308 - Progressing by Sissy HoffBarber, Ka Flammer M, RN

## 2017-06-26 NOTE — Progress Notes (Signed)
Subjective/Complaints:  Cont BM on bedpan today No pain with urination  Still with neuropathy pain mainly in hands  ROS-  Neg CP, SOB, N/V/D  Objective: Vital Signs: Blood pressure 96/68, pulse (!) 120, temperature 98.3 F (36.8 C), temperature source Oral, resp. rate 18, height '5\' 3"'$  (1.6 m), weight 70.6 kg (155 lb 10.3 oz), last menstrual period 02/22/2006, SpO2 97 %. No results found. Results for orders placed or performed during the hospital encounter of 06/23/17 (from the past 72 hour(s))  Urinalysis, Routine w reflex microscopic     Status: Abnormal   Collection Time: 06/23/17  3:19 PM  Result Value Ref Range   Color, Urine YELLOW YELLOW   APPearance CLEAR CLEAR   Specific Gravity, Urine 1.004 (L) 1.005 - 1.030   pH 6.0 5.0 - 8.0   Glucose, UA NEGATIVE NEGATIVE mg/dL   Hgb urine dipstick NEGATIVE NEGATIVE   Bilirubin Urine NEGATIVE NEGATIVE   Ketones, ur NEGATIVE NEGATIVE mg/dL   Protein, ur NEGATIVE NEGATIVE mg/dL   Nitrite NEGATIVE NEGATIVE   Leukocytes, UA SMALL (A) NEGATIVE   RBC / HPF 0-5 0 - 5 RBC/hpf   WBC, UA 6-30 0 - 5 WBC/hpf   Bacteria, UA MANY (A) NONE SEEN   Squamous Epithelial / LPF 0-5 (A) NONE SEEN   Mucus PRESENT   Urine culture     Status: Abnormal   Collection Time: 06/23/17  3:19 PM  Result Value Ref Range   Specimen Description URINE, RANDOM    Special Requests NONE    Culture >=100,000 COLONIES/mL ESCHERICHIA COLI (A)    Report Status 06/25/2017 FINAL    Organism ID, Bacteria ESCHERICHIA COLI (A)       Susceptibility   Escherichia coli - MIC*    AMPICILLIN <=2 SENSITIVE Sensitive     CEFAZOLIN <=4 SENSITIVE Sensitive     CEFTRIAXONE <=1 SENSITIVE Sensitive     CIPROFLOXACIN <=0.25 SENSITIVE Sensitive     GENTAMICIN <=1 SENSITIVE Sensitive     IMIPENEM <=0.25 SENSITIVE Sensitive     NITROFURANTOIN <=16 SENSITIVE Sensitive     TRIMETH/SULFA <=20 SENSITIVE Sensitive     AMPICILLIN/SULBACTAM <=2 SENSITIVE Sensitive     PIP/TAZO <=4  SENSITIVE Sensitive     Extended ESBL NEGATIVE Sensitive     * >=100,000 COLONIES/mL ESCHERICHIA COLI  CBC WITH DIFFERENTIAL     Status: Abnormal   Collection Time: 06/24/17  5:09 AM  Result Value Ref Range   WBC 3.4 (L) 4.0 - 10.5 K/uL   RBC 3.54 (L) 3.87 - 5.11 MIL/uL   Hemoglobin 10.9 (L) 12.0 - 15.0 g/dL   HCT 31.9 (L) 36.0 - 46.0 %   MCV 90.1 78.0 - 100.0 fL   MCH 30.8 26.0 - 34.0 pg   MCHC 34.2 30.0 - 36.0 g/dL   RDW 13.3 11.5 - 15.5 %   Platelets 197 150 - 400 K/uL   Neutrophils Relative % 41 %   Neutro Abs 1.4 (L) 1.7 - 7.7 K/uL   Lymphocytes Relative 41 %   Lymphs Abs 1.4 0.7 - 4.0 K/uL   Monocytes Relative 15 %   Monocytes Absolute 0.5 0.1 - 1.0 K/uL   Eosinophils Relative 2 %   Eosinophils Absolute 0.1 0.0 - 0.7 K/uL   Basophils Relative 1 %   Basophils Absolute 0.0 0.0 - 0.1 K/uL  Comprehensive metabolic panel     Status: Abnormal   Collection Time: 06/24/17  5:09 AM  Result Value Ref Range   Sodium 133 (L)  135 - 145 mmol/L   Potassium 4.1 3.5 - 5.1 mmol/L   Chloride 108 101 - 111 mmol/L   CO2 18 (L) 22 - 32 mmol/L   Glucose, Bld 100 (H) 65 - 99 mg/dL   BUN <5 (L) 6 - 20 mg/dL   Creatinine, Ser 0.48 0.44 - 1.00 mg/dL   Calcium 8.2 (L) 8.9 - 10.3 mg/dL   Total Protein 4.8 (L) 6.5 - 8.1 g/dL   Albumin 1.9 (L) 3.5 - 5.0 g/dL   AST 24 15 - 41 U/L   ALT 16 14 - 54 U/L   Alkaline Phosphatase 97 38 - 126 U/L   Total Bilirubin 0.9 0.3 - 1.2 mg/dL   GFR calc non Af Amer >60 >60 mL/min   GFR calc Af Amer >60 >60 mL/min    Comment: (NOTE) The eGFR has been calculated using the CKD EPI equation. This calculation has not been validated in all clinical situations. eGFR's persistently <60 mL/min signify possible Chronic Kidney Disease.    Anion gap 7 5 - 15    Head:Normocephalicand atraumatic.  Mouth/Throat:Oropharynx is clear and moist. Multiple missing teeth. Eyes:Conjunctivaeare normal. Pupils are equal, round, and reactive to light.  Neck:Normal range  of motion.Neck supple.  Cardiovascular:Regular rhythm.Tachycardiapresent.  Respiratory:Effort normaland breath sounds normal. Nostridor. Norespiratory distress. She hasno wheezes.  ST:MHDQ.Bowel sounds are normal.  Musculoskeletal: She exhibits noedema.  Neurological: She isalertand oriented to person, place, and time.Coordinationnormal.  Speech clear. Anxious. BUE with decreased motor control and distal weakness. Skin: Skin iswarmand dry. She isnot diaphoretic.  Psychiatric: [name] is normal. Her mood appearsanxious. She expressesinappropriate judgment. She isinattentive. Mild ataxia right finger-nose-finger and left finger-nose-finger Moderate ataxia bilateral heel to shin Sensation reduced to proprioception left great toe but intact in the right great toe light touch is intact bilaterally in upper and lower limbs. Motor strength is 4/5 bilateral deltoid bicep tricep finger flexors and extensors 3/5 bilateral hip flexor knee extensor ankle dorsiflexor plantar flexor      Assessment/Plan: 1. Functional deficits secondary to quadriparesis which require 3+ hours per day of interdisciplinary therapy in a comprehensive inpatient rehab setting. Physiatrist is providing close team supervision and 24 hour management of active medical problems listed below. Physiatrist and rehab team continue to assess barriers to discharge/monitor patient progress toward functional and medical goals. FIM: Function - Bathing Bathing activity did not occur: Refused  Function- Upper Body Dressing/Undressing What is the patient wearing?: Pull over shirt/dress Pull over shirt/dress - Perfomed by patient: Thread/unthread right sleeve, Thread/unthread left sleeve Pull over shirt/dress - Perfomed by helper: Put head through opening, Pull shirt over trunk Assist Level: Touching or steadying assistance(Pt > 75%) Function - Lower Body Dressing/Undressing What is the patient wearing?:  Pants, Non-skid slipper socks Position: Sitting EOB Pants- Performed by helper: Thread/unthread right pants leg, Thread/unthread left pants leg, Pull pants up/down Non-skid slipper socks- Performed by helper: Don/doff right sock, Don/doff left sock Assist for footwear: Dependant Assist for lower body dressing: Touching or steadying assistance (Pt > 75%)  Function - Toileting Toileting activity did not occur: No continent bowel/bladder event Toileting steps completed by patient: Performs perineal hygiene Toileting steps completed by helper: Adjust clothing prior to toileting, Adjust clothing after toileting Toileting Assistive Devices: Grab bar or rail Assist level: Touching or steadying assistance (Pt.75%)  Function - Air cabin crew transfer activity did not occur: Refused Toilet transfer assistive device: Elevated toilet seat/BSC over toilet, Grab bar Assist level to toilet: Touching or steadying assistance (Pt >  75%) Assist level from toilet: Moderate assist (Pt 50 - 74%/lift or lower)  Function - Chair/bed transfer Chair/bed transfer method: Stand pivot Chair/bed transfer assist level: Touching or steadying assistance (Pt > 75%) Chair/bed transfer assistive device: Armrests, Walker Chair/bed transfer details: Manual facilitation for weight shifting, Manual facilitation for placement, Manual facilitation for weight bearing, Verbal cues for precautions/safety  Function - Locomotion: Wheelchair Will patient use wheelchair at discharge?: Yes Type: Manual Max wheelchair distance: 150' Assist Level: Supervision or verbal cues Wheel 50 feet with 2 turns activity did not occur: Refused Assist Level: Supervision or verbal cues Wheel 150 feet activity did not occur: Refused Assist Level: Supervision or verbal cues Turns around,maneuvers to table,bed, and toilet,negotiates 3% grade,maneuvers on rugs and over doorsills: No Function - Locomotion: Ambulation Assistive device:  Walker-rolling Max distance: 50 Assist level: Touching or steadying assistance (Pt > 75%) Assist level: Touching or steadying assistance (Pt > 75%) Walk 50 feet with 2 turns activity did not occur: Safety/medical concerns Assist level: Touching or steadying assistance (Pt > 75%) Walk 150 feet activity did not occur: Safety/medical concerns Walk 10 feet on uneven surfaces activity did not occur: Safety/medical concerns  Function - Comprehension Comprehension: Auditory Comprehension assist level: Follows complex conversation/direction with no assist  Function - Expression Expression: Verbal Expression assist level: Expresses complex ideas: With no assist  Function - Social Interaction Social Interaction assist level: Interacts appropriately with others with medication or extra time (anti-anxiety, antidepressant).  Function - Problem Solving Problem solving assist level: Solves complex problems: With extra time  Function - Memory Memory assist level: Complete Independence: No helper Patient normally able to recall (first 3 days only): Current season, Location of own room, Staff names and faces, That he or she is in a hospital   Medical Problem List and Plan: 1.   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 DVT Prophylaxis/Anticoagulation: Pharmaceutical:Lovenox 3. Pain Management:will likely needs gabapentin titrated upwards. Ultram and/or tylenol prn 4. Mood:LCSW to provide ego support. LCSW to follow for evaluation and support. 5. Neuropsych: This patientiscapable of making decisions on herown behalf. 6. Skin/Wound Care:routine pressure relief measurs 7. Fluids/Electrolytes/Nutrition:Recheck lytes in am. Hyponatremia/hypokalemia resolved.  8. ABLA: Recheck labs in am. 9. H/o Constipation: CT with evidence of constipation. Hasn't had BM yest. Will schedule miralax daily.  10. Urinary retention and UTI. Monitor voiding with PVR checks.>100K  ecoli S to cefazolin                  11. Anxiety disorder: Continue Remeron and Seroquel at bedtime. Tachycardia/elevated BP due to pain and anxiety at admission. Recheck vitals.  Sees Dr. Sabra Heck at behavioral health recommend neuropsych eval 12. Neuropathy: continue Gabapentin 400 mg qid.c/o pain but has no hyperesthesia in hands even with firm grasp 13.  History of alcohol abuse has CT abdomen evidence of fatty liver plus chronic pancreatitis.  Patient states that she last drank heavily a couple years ago, with lighter alcohol use last around 3 or 4 months ago 14.  Tobacco abuse will start NicoDerm patchon 1/4 15.  Sinus tachycardia, mild asymptomatic no tx for now 16.  Constipation improved on Senna S BID  LOS (Days) 3 A FACE TO FACE EVALUATION WAS PERFORMED  Charlett Blake 06/26/2017, 10:16 AM

## 2017-06-26 NOTE — Plan of Care (Signed)
  Progressing RH BOWEL ELIMINATION RH STG MANAGE BOWEL WITH ASSISTANCE Description STG Manage Bowel with mod Assistance.  06/26/2017 0217 - Progressing by Sissy HoffBarber, Anaelle Dunton M, RN RH STG MANAGE BOWEL W/MEDICATION W/ASSISTANCE Description STG Manage Bowel with Medication with mod Assistance.  06/26/2017 0217 - Progressing by Sissy HoffBarber, Clydene Burack M, RN RH BLADDER ELIMINATION RH STG MANAGE BLADDER WITH ASSISTANCE Description STG Manage Bladder With mod Assistance  06/26/2017 0217 - Progressing by Sissy HoffBarber, Jodie Leiner M, RN RH SKIN INTEGRITY RH STG SKIN FREE OF INFECTION/BREAKDOWN Description Free from skin breakdown entire stay in rehab  06/26/2017 0217 - Progressing by Sissy HoffBarber, Lillia Lengel M, RN RH STG MAINTAIN SKIN INTEGRITY WITH ASSISTANCE Description STG Maintain Skin Integrity With Mod Assistance.  06/26/2017 0217 - Progressing by Sissy HoffBarber, Emmalee Solivan M, RN RH SAFETY RH STG ADHERE TO SAFETY PRECAUTIONS W/ASSISTANCE/DEVICE Description STG Adhere to Safety Precautions With mod Assistance/Device.  06/26/2017 0217 - Progressing by Sissy HoffBarber, Azlaan Isidore M, RN RH PAIN MANAGEMENT RH STG PAIN MANAGED AT OR BELOW PT'S PAIN GOAL Description Less than 2  06/26/2017 0217 - Progressing by Sissy HoffBarber, Leib Elahi M, RN

## 2017-06-27 ENCOUNTER — Inpatient Hospital Stay (HOSPITAL_COMMUNITY): Payer: Self-pay | Admitting: Physical Therapy

## 2017-06-27 ENCOUNTER — Inpatient Hospital Stay (HOSPITAL_COMMUNITY): Payer: Self-pay | Admitting: Occupational Therapy

## 2017-06-27 ENCOUNTER — Other Ambulatory Visit: Payer: Self-pay

## 2017-06-27 NOTE — Progress Notes (Signed)
Occupational Therapy Session Note  Patient Details  Name: Catherine Butler MRN: [id number] Date of Birth: 01-20-1961  Today's Date: 06/27/2017  Session 1 OT Individual Time: 9030-0923 OT Individual Time Calculation (min): 58 min   Session 2 OT Individual Time: 1410-1520 OT Individual Time Calculation (min): 70 min   Short Term Goals: Week 1:  OT Short Term Goal 1 (Week 1): Pt will complete toilet transfe with min A OT Short Term Goal 2 (Week 1): Pt will tolerate standing for 1 minute while participating in BADL task OT Short Term Goal 3 (Week 1): Pt will complete 1/3 steps of toileting task OT Short Term Goal 4 (Week 1): Pt will complete tub bench transfer with min A  Skilled Therapeutic Interventions/Progress Updates:  Session 1   Pt came to sitting EOB with supervision, stood to remove clothing with min guard A for balance using RW for support. Pt needed extended rest break after standing prior to donning new pants and standing for 2nd time with min guard A. Pt doffed and donned new shirt with increased time Worked on fine motor skills to tie shoes with pt having difficulty grasping shoe laces 2/2 neuropothy. Pt ambulated 10 feet in room with wc follow w/ Min a and RW. B UE strength/coordination with wc propulsion with pt fatiguing quickly requiring OT assist to get to therapy apartment. Practiced tub bench transfer with min guard A and RW for stand-pivot. Changed out wc cushion cover, then worked on sit<>stands and standing endurance with 5 sit<.stands and 1.5 minutes of static standing. Pt returned to room and completed stand-pivot to commode using grab bars and min A. Pt unable to void and needed assistance with clothing management 2/2 fatigue. Pt left seated in wc at end of session with needs met.  Session 2 Pt greeted asleep in bed upon OT arrival. Easily awakened and reported need to urinate. Pt ambulated to bathroom with RW and min guard A, + vc for RW positioning to turn and sit onto  commode. Pt managed clothing with min A, then voided bladder successfully. Pt completed peri-care and min A to pull up pants as they fell to her feet and pt unable to reach forward far enough safely. Pt washed hands at the sink for 1 minute with VC for RW positioning. Pt ate lunch seated in wc with assistance to open containers. Pt ambulated to bathroom again in similar fashion and transferred onto tub bench with min guard. Pt set-up for shower with min guard A for balance when washing buttocks and drying off behind. Stand-pivot out of shower with close supervision. Pt took extended rest break prior to donning clothing at EOB. Min A overall for LB dressing to thread pants over feet with non-skid socks on. Pt returned to bed at end of session and left semi-reclined with needs met and bed alarm on.   Therapy Documentation Precautions:  Precautions Precautions: Fall Restrictions Weight Bearing Restrictions: No Pain:   ADL: ADL ADL Comments: Please see functional navigator Other Treatments:    See Function Navigator for Current Functional Status.   Therapy/Group: Individual Therapy  Valma Cava 06/27/2017, 3:21 PM

## 2017-06-27 NOTE — Progress Notes (Signed)
Physical Therapy Session Note  Patient Details  Name: Catherine Butler MRN: [id number] Date of Birth: 05/06/1961  Today's Date: 06/27/2017 PT Individual Time: 1000-1100 PT Individual Time Calculation (min): 60 min   Short Term Goals: Week 1:  PT Short Term Goal 1 (Week 1): Pt will transfer with min assist PT Short Term Goal 2 (Week 1): Pt will ambulate 1930' with LRAD and mod assist PT Short Term Goal 3 (Week 1): Pt will initiate stair training with PT PT Short Term Goal 4 (Week 1): Pt will propel w/c 100' with supervision.   Skilled Therapeutic Interventions/Progress Updates:    c/o pain in bilateral hands, no change.  Session focus on activity tolerance and NMR.    Pt taken to therapy gym total assist.  Stand/pivot throughout session with min guard<>close supervision.  Nustep 2x3 minutes +4 minutes at level 4 focus on reciprocal stepping pattern retraining, weight bearing, and activity tolerance.  Gait x60' with RW and min assist, cues for step length, foot placement, posture.  NMR in standing with toe taps L and R 3x10 with rest breaks between each trial, mod fade to min assist with cues for foot placement, finding balance rather than leaning on PT, and coordination of movement.  Pt reporting need to toilet.  Returned to room and pt completes stand/pivot to and from toilet, and 3/3 toileting steps with steady assist (-) void/BM.  Financial counselors arriving at this time.  Pt left upright in w/c, missed 15 minutes of skilled PT.   Therapy Documentation Precautions:  Precautions Precautions: Fall Restrictions Weight Bearing Restrictions: No General: PT Amount of Missed Time (min): 15 Minutes PT Missed Treatment Reason: Other (Comment)(financial counselor present)   See Function Navigator for Current Functional Status.   Therapy/Group: Individual Therapy  Stephania FragminCaitlin E Josede Cicero 06/27/2017, 11:09 AM

## 2017-06-27 NOTE — Progress Notes (Signed)
Subjective/Complaints:  No issues overnite  ROS-  Neg CP, SOB, N/V/D  Objective: Vital Signs: Blood pressure 109/77, pulse (!) 119, temperature 98.6 F (37 C), temperature source Oral, resp. rate 18, height 5\' 3"  (1.6 m), weight 65.8 kg (145 lb 1 oz), last menstrual period 02/22/2006, SpO2 94 %. No results found. No results found for this or any previous visit (from the past 72 hour(s)).  Head:Normocephalicand atraumatic.  Mouth/Throat:Oropharynx is clear and moist. Multiple missing teeth. Eyes:Conjunctivaeare normal. Pupils are equal, round, and reactive to light.  Neck:Normal range of motion.Neck supple.  Cardiovascular:Regular rhythm.Tachycardiapresent.  Respiratory:Effort normaland breath sounds normal. Nostridor. Norespiratory distress. She hasno wheezes.  QI:ONGEGI:Soft.Bowel sounds are normal.  Musculoskeletal: She exhibits noedema.  Neurological: She isalertand oriented to person, place, and time.Coordinationnormal.  Speech clear. Anxious. BUE with decreased motor control and distal weakness. Skin: Skin iswarmand dry. She isnot diaphoretic.  Psychiatric: [name] is normal. Her mood appearsanxious. She expressesinappropriate judgment. She isinattentive. Mild ataxia right finger-nose-finger and left finger-nose-finger Moderate ataxia bilateral heel to shin Sensation reduced to proprioception left great toe but intact in the right great toe light touch is intact bilaterally in upper and lower limbs. Motor strength is 4/5 bilateral deltoid bicep tricep finger flexors and extensors 3/5 bilateral hip flexor knee extensor ankle dorsiflexor plantar flexor      Assessment/Plan: 1. Functional deficits secondary to quadriparesis which require 3+ hours per day of interdisciplinary therapy in a comprehensive inpatient rehab setting. Physiatrist is providing close team supervision and 24 hour management of active medical problems listed  below. Physiatrist and rehab team continue to assess barriers to discharge/monitor patient progress toward functional and medical goals. FIM: Function - Bathing Bathing activity did not occur: Refused  Function- Upper Body Dressing/Undressing What is the patient wearing?: Pull over shirt/dress Pull over shirt/dress - Perfomed by patient: Thread/unthread right sleeve, Thread/unthread left sleeve Pull over shirt/dress - Perfomed by helper: Put head through opening, Pull shirt over trunk Assist Level: Touching or steadying assistance(Pt > 75%) Function - Lower Body Dressing/Undressing What is the patient wearing?: Pants, Non-skid slipper socks Position: Sitting EOB Pants- Performed by helper: Thread/unthread right pants leg, Thread/unthread left pants leg, Pull pants up/down Non-skid slipper socks- Performed by helper: Don/doff right sock, Don/doff left sock Assist for footwear: Dependant Assist for lower body dressing: Touching or steadying assistance (Pt > 75%)  Function - Toileting Toileting activity did not occur: No continent bowel/bladder event Toileting steps completed by patient: Performs perineal hygiene Toileting steps completed by helper: Adjust clothing prior to toileting, Adjust clothing after toileting Toileting Assistive Devices: Grab bar or rail Assist level: Touching or steadying assistance (Pt.75%)  Function - ArchivistToilet Transfers Toilet transfer activity did not occur: Refused Toilet transfer assistive device: Elevated toilet seat/BSC over toilet, Grab bar Assist level to toilet: Touching or steadying assistance (Pt > 75%) Assist level from toilet: Moderate assist (Pt 50 - 74%/lift or lower)  Function - Chair/bed transfer Chair/bed transfer method: Stand pivot Chair/bed transfer assist level: Touching or steadying assistance (Pt > 75%) Chair/bed transfer assistive device: Walker, Armrests Chair/bed transfer details: Manual facilitation for weight shifting, Manual  facilitation for placement, Manual facilitation for weight bearing, Verbal cues for precautions/safety, Verbal cues for technique, Verbal cues for sequencing  Function - Locomotion: Wheelchair Will patient use wheelchair at discharge?: Yes Type: Manual Max wheelchair distance: 150' Assist Level: Supervision or verbal cues Wheel 50 feet with 2 turns activity did not occur: Refused Assist Level: Supervision or verbal cues Wheel 150 feet activity  did not occur: Refused Assist Level: Supervision or verbal cues Turns around,maneuvers to table,bed, and toilet,negotiates 3% grade,maneuvers on rugs and over doorsills: No Function - Locomotion: Ambulation Assistive device: Walker-rolling Max distance: 50 Assist level: Touching or steadying assistance (Pt > 75%) Assist level: Touching or steadying assistance (Pt > 75%) Walk 50 feet with 2 turns activity did not occur: Safety/medical concerns Assist level: Touching or steadying assistance (Pt > 75%) Walk 150 feet activity did not occur: Safety/medical concerns Walk 10 feet on uneven surfaces activity did not occur: Safety/medical concerns  Function - Comprehension Comprehension: Auditory Comprehension assist level: Follows complex conversation/direction with no assist  Function - Expression Expression: Verbal Expression assist level: Expresses complex ideas: With no assist  Function - Social Interaction Social Interaction assist level: Interacts appropriately with others with medication or extra time (anti-anxiety, antidepressant).  Function - Problem Solving Problem solving assist level: Solves complex problems: With extra time  Function - Memory Memory assist level: Complete Independence: No helper Patient normally able to recall (first 3 days only): Current season, Location of own room, Staff names and faces, That he or she is in a hospital   Medical Problem List and Plan: 1.   Quadriparesis secondary to Guillain Barr  syndrome CIR PT, OT tolerating therapy without orthostasis 2. DVT Prophylaxis/Anticoagulation: Pharmaceutical:Lovenox 3. Pain Management:will likely needs gabapentin titrated upwards. Ultram and/or tylenol prn 4. Mood:LCSW to provide ego support. LCSW to follow for evaluation and support. 5. Neuropsych: This patientiscapable of making decisions on herown behalf. 6. Skin/Wound Care:routine pressure relief measurs 7. Fluids/Electrolytes/Nutrition:Recheck lytes weekly 8. ABLA: Recheck CBC weekly. 9. H/o Constipation: CT with evidence of constipation. Hasn't had BM yest. Will schedule miralax daily.  10. Urinary retention and UTI. Monitor voiding with PVR checks.>100K ecoli S to cefazolin                  11. Anxiety disorder: Continue Remeron and Seroquel at bedtime. Tachycardia/elevated BP due to pain and anxiety at admission. Recheck vitals.  Sees Dr. Dub Mikes at behavioral health recommend neuropsych eval 12. Neuropathy: continue Gabapentin 600 mg qid.c/o pain but has no hyperesthesia in hands even with firm grasp 13.  History of alcohol abuse has CT abdomen evidence of fatty liver plus chronic pancreatitis.  Patient states that she last drank heavily a couple years ago, with lighter alcohol use last around 3 or 4 months ago 14.  Tobacco abuse will start NicoDerm patchon 1/4 15.  Sinus tachycardia, mild asymptomatic no tx for now 16.  Constipation improved on Senna S BID  LOS (Days) 4 A FACE TO FACE EVALUATION WAS PERFORMED  Erick Colace 06/27/2017, 8:52 AM

## 2017-06-28 ENCOUNTER — Inpatient Hospital Stay (HOSPITAL_COMMUNITY): Payer: Self-pay | Admitting: Physical Therapy

## 2017-06-28 ENCOUNTER — Inpatient Hospital Stay (HOSPITAL_COMMUNITY): Payer: Self-pay | Admitting: Occupational Therapy

## 2017-06-28 ENCOUNTER — Other Ambulatory Visit: Payer: Self-pay

## 2017-06-28 ENCOUNTER — Encounter (HOSPITAL_COMMUNITY): Payer: Self-pay | Admitting: Psychology

## 2017-06-28 NOTE — Evaluation (Signed)
Recreational Therapy Assessment and Plan  Patient Details  Name: Catherine Butler MRN: [id number] Date of Birth: 12-12-1960 Today's Date: 06/28/2017  Rehab Potential: Good ELOS: 2 weeks   Assessment Problem List:      Patient Active Problem List   Diagnosis Date Noted  . Debility 06/23/2017  . Constipation   . Quadriplegia and quadriparesis ([place])   . Guillain Barr syndrome ([place])   . Nausea & vomiting 06/21/2017  . Hypokalemia 06/21/2017  . Volume depletion 06/21/2017  . Sinus tachycardia 06/21/2017  . Nausea and vomiting 06/21/2017  . Essential hypertension 04/14/2017  . Paresthesia   . 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 Abdominal pain 01/01/2017  . Sepsis ([place]) 01/01/2017  . Tobacco abuse 01/01/2017  . Abnormal LFTs 01/01/2017  . Depression   . COPD (chronic obstructive pulmonary disease) ([place]) 06/16/2015    Past Medical History:      Past Medical History:  Diagnosis Date  . Anxiety   . Arthritis   . Asthma   . COPD (chronic obstructive pulmonary disease) ([place])   . Depression   . H/O alcohol abuse   . Hypertension   . Reflux   . Suicide attempt by multiple drug overdose ([place]) 2014   Past Surgical History:       Past Surgical History:  Procedure Laterality Date  . LAPAROSCOPY    . laporscopy surgery for ovarian cyst  20 years ago  . TONSILLECTOMY      Assessment & Plan Clinical Impression: A58 y.o.femalewith history of anxiety disorder, depression,alcohol abuse,COPD, admission [facility] 10/22-10/31/18 with back pain, progressive BUE/BLE paraesthesias, difficulty with walking, bowel incontinence and urinary retention. Also with reports of recent flu vaccine and was treated with IVIG due to suspicion of GBS and discharged to SNF with recommendations of EMG by neurology on outpatient basis. She was discharged to home recently but [place] on 06/20/17 with nausea/vomiting X four day and  weakness. CT abdomen done revealing hepatic steatosis, stigmata of chronic pancreatitis with pancreatitic calcifications, increased fecal retention and stable moderate HH. She was noted to have urinary retention with bladder volume 600cc.  Electrolyte abnormality felt to be due to viral syndrome v/s food poisoning and treated with IVF. UDS negative. UA negative.Therapy evaluations done showing diffuse weakness, poor safety and neuropathy affecting functional status. CIR recommended for aggressive rehab program.  Patient transferred to CIR on 06/23/2017.  Pt presents with decreased activity tolerance, decreased functional mobility, decreased balance, decreased coordination Limiting pt's independence with leisure/community pursuits.   Leisure History/Participation Premorbid leisure interest/current participation: Careers information officer store Premorbid leisure interest/no interest in trying: Medical laboratory scientific officer - Doctor, hospital - Production designer, theatre/television/film Expression Interests: Music (Comment) Other Leisure Interests: Television;Reading;Cooking/Baking Leisure Participation Style: Alone;With Family/Friends Awareness of Community Resources: Good-identify 3 post discharge leisure resources Psychosocial / Spiritual Patient agreeable to Pet Therapy: Yes Does patient have pets?: Yes Social interaction - Mood/Behavior: Cooperative Academic librarian Appropriate for Education?: Yes Patient Agreeable to Outing?: Yes Recreational Therapy Orientation Orientation -Reviewed with patient: Available activity resources Strengths/Weaknesses Patient Strengths/Abilities: Willingness to participate Patient weaknesses: Physical limitations TR Patient demonstrates impairments in the following area(s): Safety;Endurance;Skin Integrity;Motor;Pain  Plan Rec Therapy Plan Is patient appropriate for Therapeutic Recreation?: Yes Rehab Potential: Good Treatment times per week: Min 1 TR session/group >25 minutes during  LOS Estimated Length of Stay: 2 weeks TR Treatment/Interventions: Adaptive equipment instruction;Group participation (Comment);Therapeutic exercise;Wheelchair propulsion/positioning;1:1 session;Community reintegration;Leisure education;Recreation/leisure participation;UE/LE Chartered certified accountant training;Functional mobility training;Patient/family education;Therapeutic activities Recommendations for other services: Neuropsych  Recommendations for other services: Neuropsych  Discharge Criteria: Patient will be discharged from TR if patient refuses treatment 3 consecutive times without medical reason.  If treatment goals not met, if there is a change in medical status, if patient makes no progress towards goals or if patient is discharged from hospital.  The above assessment, treatment plan, treatment alternatives and goals were discussed and mutually agreed upon: by patient  [place] 06/28/2017, 3:54 PM

## 2017-06-28 NOTE — Consult Note (Signed)
Neuropsychological Consultation   Patient:   Catherine Butler   DOB:   May 03, 1961  MR Number:  [id number]  Location:  [facility] [id number] [place] [address] Dept: [phone] Loc: [phone]           Date of Service:   06/28/2017  Start Time:   1 PM End Time:   2 PM  Provider/Observer:  Arley Phenix, Psy.D.       Clinical Neuropsychologist       Billing Code/Service: [phone] 4 Units  Chief Complaint:    Catherine Butler is a 57 year old female with history of anxiety disorder, depression, alcohol abuse and COPD.  Initially presented at [facility] on 04/11/2017 with back pain and paraesthesias.  Difficulty walking, bowel incontinence and urinary retention.  Recent flu vaccine.  Suspected GBS and treated with IVIG.  Discharged to SNF.  Readmitted to [facility] on 06/20/2017 with nausea/vomiting X four day and weakness. CT abdomen done revealing hepatic steatosis, stigmata of chronic pancreatitis with pancreatitic calcifications, increased fecal retention and stable moderate HH. She was noted to have urinary retention with bladder volume 600cc.  Felt due to viral syndrome and treated with IVF.  The patient was recommended for CIR due to weakness, poor safety and neuropathy.    Reason for Service:  Honore Wipperfurth was referred from neuropsychological consultation due to coping and adjustment issues.  Patient reports increased anxiety not no significant increase in depressive symptoms.  Below is the HPI for the current admission.  [name] a 57 y.o.femalewith history of anxiety disorder, depression,alcohol abuse,COPD, admission [facility] 10/22-10/31/18 with back pain, progressive BUE/BLE paraesthesias, difficulty with walking, bowel incontinence and urinary retention. Also with reports of recent flu vaccine and was treated with IVIG due to suspicion of GBS and discharged to SNF with recommendations of EMG by  neurology on outpatient basis. She was discharged to homefew weeks agobut readmitted on [facility] on 06/20/17 with nausea/vomiting X four day and weakness. CT abdomen done revealing hepatic steatosis, stigmata of chronic pancreatitis with pancreatitic calcifications, increased fecal retention and stable moderate HH. She was noted to have urinary retention with bladder volume 600cc  Electrolyte abnormality felt to be due to viral syndrome v/s food poisoning and treated with IVF. UDS negative. UA negative.Therapy evaluations done showing diffuse weakness, poor safety and neuropathy affecting functional status. CIR recommended for aggressive rehab program.    Current Status:  Patient reports that there is increase in anxiety and worry.  She reports that she moved back to live with parents to help them.  Mom has moblity issues and father has dementia and congestive heart failure.  Patient worries that her mother is having to help her and not the other way around.  She reports that she does feel like she is getting stronger and is able to stand but walking is still hard for her.  Behavioral Observation: Catherine Butler  presents as a 57 y.o.-year-old Right Caucasian Female who appeared her stated age. her dress was Appropriate and she was Well Groomed and her manners were Appropriate to the situation.  her participation was indicative of Appropriate and Attentive behaviors.  There were physical disabilities noted.  she displayed an appropriate level of cooperation and motivation.     Interactions:    Active Appropriate  Attention:   abnormal and attention span appeared shorter than expected for age  Memory:   within normal limits; recent  and remote memory intact  Visuo-spatial:  not examined  Speech (Volume):  low  Speech:   normal; normal  Thought Process:  Coherent and Relevant  Though Content:  WNL; not suicidal  Orientation:   person, place, time/date and  situation  Judgment:   Fair  Planning:   Fair  Affect:    Anxious  Mood:    Anxious and Depressed  Insight:   Fair  Intelligence:   normal  Marital Status/Living: The patient is living with parents who have needed her to help with care prior to initial admission in October.    Medical History:   Past Medical History:  Diagnosis Date  . Anxiety   . Arthritis   . Asthma   . COPD (chronic obstructive pulmonary disease) ([facility])   . Depression   . H/O alcohol abuse   . Hypertension   . Reflux   . Suicide attempt by multiple drug overdose [facility]) 2014        Psychiatric History:  Patient has a prior history of depression and anxiety and one prior suicide attempt with drug overdose.  Family Med/Psych History:  Family History  Problem Relation Age of Onset  . Diabetes Father   . Hypertension Father 50       heart attack  . Breast cancer Mother 30       lump removed, bile duct cancer  . Hypertension Brother   . Allergic rhinitis Paternal Grandfather     Risk of Suicide/Violence: low Patient denies SI or HI.  Impression/DX:  Catherine Butler is a 57 year old female with history of anxiety disorder, depression, alcohol abuse and COPD.  [facility] with back pain and paraesthesias.  Difficulty walking, bowel incontinence and urinary retention.  Recent flu vaccine.  Suspected GBS and treated with IVIG.  Discharged to SNF.  [facility]2017 with nausea/vomiting X four day and weakness. CT abdomen done revealing hepatic steatosis, stigmata of chronic pancreatitis with pancreatitic calcifications, increased fecal retention and stable moderate HH. She was noted to have urinary retention with bladder volume 600cc.  Felt due to viral syndrome and treated with IVF.  The patient was recommended for CIR due to weakness, poor safety and neuropathy.    Patient reports that there is increase in anxiety and worry.  She reports that she moved back to live  with parents to help them.  Mom has moblity issues and father has dementia and congestive heart failure.  Patient worries that her mother is having to help her and not the other way around.  She reports that she does feel like she is getting stronger and is able to stand but walking is still hard for her.  Disposition/Plan:  Worked on coping and adjustment issues.  Will see patient again if needed.        Electronically Signed   _______________________ Arley PhenixJohn Rodenbough, Psy.D.

## 2017-06-28 NOTE — Progress Notes (Signed)
Subjective/Complaints:  No issues overnite  ROS-  Neg CP, SOB, N/V/D  Objective: Vital Signs: Blood pressure 121/69, pulse (!) 117, temperature 98.5 F (36.9 C), temperature source Oral, resp. rate 18, height 5\' 3"  (1.6 m), weight 61.2 kg (135 lb), last menstrual period 02/22/2006, SpO2 98 %. No results found. No results found for this or any previous visit (from the past 72 hour(s)).  Head:Normocephalicand atraumatic.  Mouth/Throat:Oropharynx is clear and moist. Multiple missing teeth. Eyes:Conjunctivaeare normal. Pupils are equal, round, and reactive to light.  Neck:Normal range of motion.Neck supple.  Cardiovascular:Regular rhythm.Tachycardiapresent.  Respiratory:Effort normaland breath sounds normal. Nostridor. Norespiratory distress. She hasno wheezes.  ZO:XWRU.Bowel sounds are normal.  Musculoskeletal: She exhibits noedema.  Neurological: She isalertand oriented to person, place, and time.Coordinationnormal.  Speech clear. Anxious. BUE with decreased motor control and distal weakness. Skin: Skin iswarmand dry. She isnot diaphoretic.  Psychiatric: [name] is normal. Her mood appearsanxious. She expressesinappropriate judgment. She isinattentive. Mild ataxia right finger-nose-finger and left finger-nose-finger Moderate ataxia bilateral heel to shin Sensation reduced to proprioception left great toe but intact in the right great toe light touch is intact bilaterally in upper and lower limbs.Allodynia B hands Motor strength is 4/5 bilateral deltoid bicep tricep finger flexors and extensors 3/5 bilateral hip flexor knee extensor ankle dorsiflexor plantar flexor      Assessment/Plan: 1. Functional deficits secondary to quadriparesis which require 3+ hours per day of interdisciplinary therapy in a comprehensive inpatient rehab setting. Physiatrist is providing close team supervision and 24 hour management of active medical problems listed  below. Physiatrist and rehab team continue to assess barriers to discharge/monitor patient progress toward functional and medical goals. FIM: Function - Bathing Bathing activity did not occur: Refused Position: Shower Body parts bathed by patient: Right arm, Left arm, Chest, Abdomen, Front perineal area, Buttocks, Right upper leg, Left upper leg Body parts bathed by helper: Right lower leg, Left lower leg, Back Assist Level: Touching or steadying assistance(Pt > 75%)  Function- Upper Body Dressing/Undressing What is the patient wearing?: Pull over shirt/dress Pull over shirt/dress - Perfomed by patient: Thread/unthread right sleeve, Thread/unthread left sleeve, Put head through opening, Pull shirt over trunk Pull over shirt/dress - Perfomed by helper: Put head through opening, Pull shirt over trunk Assist Level: Set up, Supervision or verbal cues Set up : To obtain clothing/put away Function - Lower Body Dressing/Undressing What is the patient wearing?: Pants Position: Sitting EOB Pants- Performed by patient: Thread/unthread right pants leg, Thread/unthread left pants leg, Pull pants up/down Pants- Performed by helper: Thread/unthread right pants leg, Thread/unthread left pants leg, Pull pants up/down Non-skid slipper socks- Performed by helper: Don/doff right sock, Don/doff left sock Assist for footwear: Dependant Assist for lower body dressing: Touching or steadying assistance (Pt > 75%)  Function - Toileting Toileting activity did not occur: No continent bowel/bladder event Toileting steps completed by patient: Adjust clothing prior to toileting, Performs perineal hygiene, Adjust clothing after toileting Toileting steps completed by helper: Adjust clothing prior to toileting, Adjust clothing after toileting Toileting Assistive Devices: Grab bar or rail Assist level: Touching or steadying assistance (Pt.75%)  Function - Archivist transfer activity did not occur:  Refused Toilet transfer assistive device: Elevated toilet seat/BSC over toilet, Grab bar Assist level to toilet: Touching or steadying assistance (Pt > 75%) Assist level from toilet: Touching or steadying assistance (Pt > 75%)  Function - Chair/bed transfer Chair/bed transfer method: Stand pivot Chair/bed transfer assist level: Touching or steadying assistance (Pt > 75%) Chair/bed transfer  assistive device: Armrests, Walker Chair/bed transfer details: Manual facilitation for weight shifting, Manual facilitation for placement, Manual facilitation for weight bearing, Verbal cues for precautions/safety, Verbal cues for technique, Verbal cues for sequencing  Function - Locomotion: Wheelchair Will patient use wheelchair at discharge?: Yes Type: Manual Max wheelchair distance: 150' Assist Level: Supervision or verbal cues Wheel 50 feet with 2 turns activity did not occur: Refused Assist Level: Supervision or verbal cues Wheel 150 feet activity did not occur: Refused Assist Level: Supervision or verbal cues Turns around,maneuvers to table,bed, and toilet,negotiates 3% grade,maneuvers on rugs and over doorsills: No Function - Locomotion: Ambulation Assistive device: Walker-rolling Max distance: 60 Assist level: Touching or steadying assistance (Pt > 75%) Assist level: Touching or steadying assistance (Pt > 75%) Walk 50 feet with 2 turns activity did not occur: Safety/medical concerns Assist level: Touching or steadying assistance (Pt > 75%) Walk 150 feet activity did not occur: Safety/medical concerns Walk 10 feet on uneven surfaces activity did not occur: Safety/medical concerns  Function - Comprehension Comprehension: Auditory Comprehension assist level: Follows complex conversation/direction with no assist  Function - Expression Expression: Verbal Expression assist level: Expresses complex ideas: With no assist  Function - Social Interaction Social Interaction assist level:  Interacts appropriately with others with medication or extra time (anti-anxiety, antidepressant).  Function - Problem Solving Problem solving assist level: Solves complex problems: With extra time  Function - Memory Memory assist level: Complete Independence: No helper Patient normally able to recall (first 3 days only): Current season, Location of own room, Staff names and faces, That he or she is in a hospital   Medical Problem List and Plan: 1.   Quadriparesis secondary to Guillain Barr syndrome CIR PT, OT team conf in am 2. DVT Prophylaxis/Anticoagulation: Pharmaceutical:Lovenox 3. Pain Management:will likely needs gabapentin titrated upwards. Ultram and/or tylenol prn 4. Mood:LCSW to provide ego support. LCSW to follow for evaluation and support. 5. Neuropsych: This patientiscapable of making decisions on herown behalf. 6. Skin/Wound Care:routine pressure relief measurs 7. Fluids/Electrolytes/Nutrition:Recheck lytes weekly 8. ABLA: Recheck CBC weekly. 9. H/o Constipation: CT with evidence of constipation. Hasn't had BM yest. Will schedule miralax daily.  10. Urinary retention and UTI. Monitor voiding with PVR checks.>100K ecoli S to cefazolin                  11. Anxiety disorder: Continue Remeron and Seroquel at bedtime. Tachycardia/elevated BP due to pain and anxiety at admission. Recheck vitals.  Sees Dr. Dub MikesLugo at behavioral health recommend neuropsych eval 12. Neuropathy: continue Gabapentin 600 mg qid.c/o pain bhas + hyperesthesia in hands but not feet, trial Lyrica in place of gabapentin 13.  History of alcohol abuse has CT abdomen evidence of fatty liver plus chronic pancreatitis.  Patient states that she last drank heavily a couple years ago, with lighter alcohol use last around 3 or 4 months ago 14.  Tobacco abuse will start NicoDerm patchon 1/4 15.  Sinus tachycardia, mild asymptomatic no tx for now  16.  Constipation improved on Senna S BID, last BM this  am  LOS (Days) 5 A FACE TO FACE EVALUATION WAS PERFORMED  Catherine Butler 06/28/2017, 7:56 AM

## 2017-06-28 NOTE — Progress Notes (Signed)
Physical Therapy Session Note  Patient Details  Name: Catherine Butler MRN: [id number] Date of Birth: 01/29/1961  Today's Date: 06/28/2017 PT Individual Time: 0930-1030 PT Individual Time Calculation (min): 60 min   Short Term Goals: Week 1:  PT Short Term Goal 1 (Week 1): Pt will transfer with min assist PT Short Term Goal 2 (Week 1): Pt will ambulate 7130' with LRAD and mod assist PT Short Term Goal 3 (Week 1): Pt will initiate stair training with PT PT Short Term Goal 4 (Week 1): Pt will propel w/c 100' with supervision.   Skilled Therapeutic Interventions/Progress Updates:    Pt supine in bed upon therapist arrival, reports 7/10 pain in most of her body but is agreeable to participate in therapy session. Per nursing pt is not due for pain medication for another hour. Sit to stand with Min Assist to RW, ambulation to bathroom with RW and Min Assist. Toilet transfer Min Assist with use of grab bar. Pt is set-up for donning clothing during session. Ambulation 3 x 30 ft with RW and Min Assist for balance, v/c for upright posture. Pt reports she is not able to ambulate unless she is watching her feet and keeps her head down during ambulation. Standing B LE coordination in // bars: alt L/R LE taps onto colored discs with verbal cues, pt exhibits decreased coordination with L LE> R LE. Pt exhibits increased anxiety with standing activities and reports feeling short of breath, SpO2 100% throughout session on room air. Instructed pt in breathing techniques. Pt needs encouragement this date to engage in therapy tasks. Pt encouraged to sit up in w/c at end of therapy session but refuses and requests to go back to bed. Pt left supine in bed with needs in reach, bed alarm in place.  Therapy Documentation Precautions:  Precautions Precautions: Fall Restrictions Weight Bearing Restrictions: No  See Function Navigator for Current Functional Status.   Therapy/Group: Individual Therapy  Peter Congoaylor Amarion Portell, PT,  DPT  06/28/2017, 12:21 PM

## 2017-06-28 NOTE — Progress Notes (Signed)
Occupational Therapy Session Note  Patient Details  Name: Catherine Butler MRN: [id number] Date of Birth: [birth year]  Today's Date: 06/28/2017  Session 1 OT Individual Time: 1100-1208 OT Individual Time Calculation (min): 68 min   Session 2 OT Individual Time: 1415-1520 OT Individual Time Calculation (min): 65 min    Short Term Goals: Week 1:  OT Short Term Goal 1 (Week 1): Pt will complete toilet transfe with min A OT Short Term Goal 2 (Week 1): Pt will tolerate standing for 1 minute while participating in BADL task OT Short Term Goal 3 (Week 1): Pt will complete 1/3 steps of toileting task OT Short Term Goal 4 (Week 1): Pt will complete tub bench transfer with min A  Skilled Therapeutic Interventions/Progress Updates:  Session 1 OT treatment session focused on fine motor skills, hand strengthening, and activity tolerance. Pt greeted semi-reclined in bed and reported urgent need to go to the bathroom upon OT arrival. Pt ambulated to bathroom with min guard A and RW. She voided bladder, completed peri-care, and only needed assistance pulling pants up after toileting.Pt then tolerated 1 minute standing at the sink to wash hands. Pt took extended rest break while nurse administered pain medication. Pt given heating pack to place on hands 2/2 pain and feeling cold. Pt completed graded peg board activity focused on in-hand manipulation, palmer translation and rotation with both L and R hand. Pt given handouts for hand there-ex, and home fine motor program. Pt completed thera-putty there-ex using soft tan putty. Pt returned to room at end of session and left supine in bed with needs met.   Session 2 OT treatment session focused on activity tolerance, functional ambulation, and simple meal prep. Pt ambulated to bathroom, voided baldder, and completed 2/3 toileting steps. Pt then tolerated 3 mins standing at the sink to wash hands and clean dentures. Worked on fine motor skills and adaptations to apply  polygrip to dentures. Pt dropping dentures frequently, requiring cues to stabilize elbows to increase distal UE control. Rec therapist joined session for simple meal prep activity focused on standing endurance and activity tolerance. Pt needed assistance to open some containers 2/2 neuropathy. Able to ambulate in kitchen with RW and min guard A to obtain items. Educated on use of countertops to help transport items. Pt tolerated ~ 5 mins standing to place ingredients and stir them together. Pt took seated rest break, then stood to wash dishes with set-up A and countertops used for support. Pt completed 5 mins on [employer] arm bike and was returned to room via wc 2/2 fatigue. Pt ambulated 10 feet in room with min guard A and RW to return to bed. Pt left with bed alarm on and needs met.      Therapy Documentation Precautions:  Precautions Precautions: Fall Restrictions Weight Bearing Restrictions: No Pain: Pain Assessment Pain Assessment: 0-10 Pain Score: 5  Pain Type: Neuropathic pain Pain Location: Hand Pain Orientation: Right;Left Pain Descriptors / Indicators: Tingling;Numbness Pain Onset: On-going Pain Intervention(s): Repositioned;Other (Comment)(RN administered pain meds) ADL: ADL ADL Comments: Please see functional navigator  See Function Navigator for Current Functional Status.   Therapy/Group: Individual Therapy  Valma Cava 06/28/2017, 3:28 PM

## 2017-06-29 ENCOUNTER — Inpatient Hospital Stay (HOSPITAL_COMMUNITY): Payer: Self-pay | Admitting: Occupational Therapy

## 2017-06-29 ENCOUNTER — Other Ambulatory Visit: Payer: Self-pay

## 2017-06-29 ENCOUNTER — Inpatient Hospital Stay (HOSPITAL_COMMUNITY): Payer: Self-pay | Admitting: Physical Therapy

## 2017-06-29 MED ORDER — PREGABALIN 50 MG PO CAPS
100.0000 mg | ORAL_CAPSULE | Freq: Two times a day (BID) | ORAL | Status: DC
  Administered 2017-06-29 – 2017-06-30 (×3): 100 mg via ORAL
  Filled 2017-06-29 (×3): qty 2

## 2017-06-29 NOTE — Progress Notes (Signed)
Subjective/Complaints:  No issues overnite, some hand hypersensitivity  ROS-  Neg CP, SOB, N/V/D  Objective: Vital Signs: Blood pressure 102/79, pulse (!) 118, temperature 98.2 F (36.8 C), temperature source Oral, resp. rate 20, height '5\' 3"'$  (1.6 m), weight 61.2 kg (135 lb), last menstrual period 02/22/2006, SpO2 98 %. No results found. No results found for this or any previous visit (from the past 72 hour(s)).  Head:Normocephalicand atraumatic.  Mouth/Throat:Oropharynx is clear and moist. Multiple missing teeth. Eyes:Conjunctivaeare normal. Pupils are equal, round, and reactive to light.  Neck:Normal range of motion.Neck supple.  Cardiovascular:Regular rhythm.Tachycardiapresent.  Respiratory:Effort normaland breath sounds normal. Nostridor. Norespiratory distress. She hasno wheezes.  PZ:WCHE.Bowel sounds are normal.  Musculoskeletal: She exhibits noedema.  Neurological: She isalertand oriented to person, place, and time.Coordinationnormal.  Speech clear. Anxious. BUE with decreased motor control and distal weakness. Skin: Skin iswarmand dry. She isnot diaphoretic.  Psychiatric: [name] is normal. Her mood appearsanxious. She expressesinappropriate judgment. She isinattentive. Mild ataxia right finger-nose-finger and left finger-nose-finger Moderate ataxia bilateral heel to shin Sensation reduced to proprioception left great toe but intact in the right great toe light touch is intact bilaterally in upper and lower limbs.Allodynia B hands Motor strength is 4/5 bilateral deltoid bicep tricep finger flexors and extensors 3/5 bilateral hip flexor knee extensor ankle dorsiflexor plantar flexor Able to squeeze without hand pain     Assessment/Plan: 1. Functional deficits secondary to quadriparesis which require 3+ hours per day of interdisciplinary therapy in a comprehensive inpatient rehab setting. Physiatrist is providing close team  supervision and 24 hour management of active medical problems listed below. Physiatrist and rehab team continue to assess barriers to discharge/monitor patient progress toward functional and medical goals. FIM: Function - Bathing Bathing activity did not occur: Refused Position: Shower Body parts bathed by patient: Right arm, Left arm, Chest, Abdomen, Front perineal area, Buttocks, Right upper leg, Left upper leg Body parts bathed by helper: Right lower leg, Left lower leg, Back Assist Level: Touching or steadying assistance(Pt > 75%)  Function- Upper Body Dressing/Undressing What is the patient wearing?: Pull over shirt/dress Pull over shirt/dress - Perfomed by patient: Thread/unthread right sleeve, Thread/unthread left sleeve, Put head through opening, Pull shirt over trunk Pull over shirt/dress - Perfomed by helper: Put head through opening, Pull shirt over trunk Assist Level: Set up, Supervision or verbal cues Set up : To obtain clothing/put away Function - Lower Body Dressing/Undressing What is the patient wearing?: Pants Position: Sitting EOB Pants- Performed by patient: Thread/unthread right pants leg, Thread/unthread left pants leg, Pull pants up/down Pants- Performed by helper: Thread/unthread right pants leg, Thread/unthread left pants leg, Pull pants up/down Non-skid slipper socks- Performed by helper: Don/doff right sock, Don/doff left sock Assist for footwear: Dependant Assist for lower body dressing: Touching or steadying assistance (Pt > 75%)  Function - Toileting Toileting activity did not occur: No continent bowel/bladder event Toileting steps completed by patient: Adjust clothing prior to toileting, Performs perineal hygiene, Adjust clothing after toileting Toileting steps completed by helper: Adjust clothing prior to toileting, Adjust clothing after toileting Toileting Assistive Devices: Grab bar or rail Assist level: Touching or steadying assistance  (Pt.75%)  Function - Air cabin crew transfer activity did not occur: Refused Toilet transfer assistive device: Elevated toilet seat/BSC over toilet, Grab bar Assist level to toilet: Touching or steadying assistance (Pt > 75%) Assist level from toilet: Touching or steadying assistance (Pt > 75%)  Function - Chair/bed transfer Chair/bed transfer method: Stand pivot Chair/bed transfer assist level: Touching  or steadying assistance (Pt > 75%) Chair/bed transfer assistive device: Armrests, Walker Chair/bed transfer details: Verbal cues for sequencing, Verbal cues for precautions/safety  Function - Locomotion: Wheelchair Will patient use wheelchair at discharge?: Yes Type: Manual Max wheelchair distance: 150' Assist Level: Supervision or verbal cues Wheel 50 feet with 2 turns activity did not occur: Refused Assist Level: Supervision or verbal cues Wheel 150 feet activity did not occur: Refused Assist Level: Supervision or verbal cues Turns around,maneuvers to table,bed, and toilet,negotiates 3% grade,maneuvers on rugs and over doorsills: No Function - Locomotion: Ambulation Assistive device: Walker-rolling Max distance: 30 ft Assist level: Touching or steadying assistance (Pt > 75%) Assist level: Touching or steadying assistance (Pt > 75%) Walk 50 feet with 2 turns activity did not occur: Safety/medical concerns Assist level: Touching or steadying assistance (Pt > 75%) Walk 150 feet activity did not occur: Safety/medical concerns Walk 10 feet on uneven surfaces activity did not occur: Safety/medical concerns  Function - Comprehension Comprehension: Auditory Comprehension assist level: Follows complex conversation/direction with extra time/assistive device  Function - Expression Expression: Verbal Expression assist level: Expresses complex ideas: With extra time/assistive device  Function - Social Interaction Social Interaction assist level: Interacts appropriately with  others with medication or extra time (anti-anxiety, antidepressant).  Function - Problem Solving Problem solving assist level: Solves complex problems: With extra time  Function - Memory Memory assist level: Complete Independence: No helper Patient normally able to recall (first 3 days only): Current season, Location of own room, That he or she is in a hospital   Medical Problem List and Plan: 1.   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 Formulized individual treatment plan based on medical history, underlying problem and comorbidities. 2. DVT Prophylaxis/Anticoagulation: Pharmaceutical:Lovenox 3. Pain Management:will likely needs gabapentin titrated upwards. Ultram and/or tylenol prn 4. Mood:LCSW to provide ego support. LCSW to follow for evaluation and support. 5. Neuropsych: This patientiscapable of making decisions on herown behalf. 6. Skin/Wound Care:routine pressure relief measurs 7. Fluids/Electrolytes/Nutrition:Recheck lytes weekly 8. ABLA: Recheck CBC weekly.Check q THurs 9. H/o Constipation: CT with evidence of constipation. Hasn't had BM yest. Will schedule miralax daily.  10. Urinary retention and UTI. Monitor voiding with PVR checks.>100K ecoli S to cefazolin                  11. Anxiety disorder: Continue Remeron and Seroquel at bedtime. Tachycardia/elevated BP due to pain and anxiety at admission. Recheck vitals.  Sees Dr. Sabra Heck at behavioral health recommend neuropsych eval 12. Neuropathy: continue Gabapentin 600 mg qid.c/o pain bhas + hyperesthesia in hands but not feet, trial Lyrica in place of gabapentin 13.  History of alcohol abuse has CT abdomen evidence of fatty liver plus chronic pancreatitis.  Patient states that she last drank heavily a couple years ago, with lighter alcohol use  last around 3 or 4 months ago 14.  Tobacco abuse will start NicoDerm patchon 1/4 15.  Sinus tachycardia, mild asymptomatic no tx for now , BP on low side, may be related to pain and anxiety 16.  Constipation improved on Senna S BID, last BM yest pm  LOS (Days) 6 A FACE TO FACE EVALUATION WAS PERFORMED  Charlett Blake 06/29/2017, 9:14 AM

## 2017-06-29 NOTE — Progress Notes (Signed)
Physical Therapy Session Note  Patient Details  Name: Catherine Butler MRN: [id number] Date of Birth: 03/13/1961  Today's Date: 06/29/2017 PT Individual Time: 1430-1530 PT Individual Time Calculation (min): 60 min   Short Term Goals: Week 1:  PT Short Term Goal 1 (Week 1): Pt will transfer with min assist PT Short Term Goal 2 (Week 1): Pt will ambulate 1830' with LRAD and mod assist PT Short Term Goal 3 (Week 1): Pt will initiate stair training with PT PT Short Term Goal 4 (Week 1): Pt will propel w/c 100' with supervision.   Skilled Therapeutic Interventions/Progress Updates:    Pt supine in bed, agreeable to participate in therapy session. No complaints of pain this PM. Pt reports she needs to use the bathroom. Ambulation to bathroom with RW and Min Assist for balance. Public relations account executiveToilet transfer Min Assist with use of grab bar and RW. Ambulation 2 x 40 ft with RW and Min Assist, ataxic gait pattern and v/c for upright posture. Seated LE coordination: alt L/R cone taps. Standing LE coordination: L/R colored dot taps with BUE support and Min Assist with v/c for R vs L and color. Side-steps in // bars 4 x 5 ft with BUE support and Min Assist. Pt fatigues quickly with standing activities and reports significant fatigue this PM, needs encouragement for participation in therapy session. Manual w/c propulsion x 150 ft with SBA. Pt left seated in bed with nursing in room to provide breathing treatment.   Therapy Documentation Precautions:  Precautions Precautions: Fall Restrictions Weight Bearing Restrictions: No  See Function Navigator for Current Functional Status.   Therapy/Group: Individual Therapy  Peter Congoaylor Taiesha Bovard, PT, DPT  06/29/2017, 3:53 PM

## 2017-06-29 NOTE — Progress Notes (Signed)
Occupational Therapy Session Note  Patient Details  Name: Catherine Butler MRN: [id number] Date of Birth: 02/27/1961  Today's Date: 06/29/2017 OT Individual Time: [id number] OT Individual Time Calculation (min): 73 min    Short Term Goals: Week 1:  OT Short Term Goal 1 (Week 1): Pt will complete toilet transfe with min A OT Short Term Goal 2 (Week 1): Pt will tolerate standing for 1 minute while participating in BADL task OT Short Term Goal 3 (Week 1): Pt will complete 1/3 steps of toileting task OT Short Term Goal 4 (Week 1): Pt will complete tub bench transfer with min A  Skilled Therapeutic Interventions/Progress Updates:    Pt greeted seated on commode, unable to void. Pt able to manage clothing, then stand-pivot back to wc with supervision. Pt ambulated 50 feet in hallway with RW and supervision/CGA A. Pt reported max fatigue after ambulation and needed extended rest break. Pt returned to room for bathing/dressing. Pt ambulated to shower and transferred onto shower chair with 75% close supervision and intermittent min guard A for turning. Bathing completed with set-up A and increased time. Pt needing extended rest break after shower prior to donning clothes 2.2 fatigue. Pt donned clothing with min A. B UE strengthening with isometric hold of hair dryer while drying hair. Fine motor exercise with threading elastic shoe laces in tennis shoes. Pt left seated at EOB at end of session with bed alarm on and nurse entering room.  Therapy Documentation Precautions:  Precautions Precautions: Fall Restrictions Weight Bearing Restrictions: No  Pain: Pain Assessment Pain Score: 8  Pain Type: Acute pain Pain Location: Generalized(Legs, Hands, Arms) Pain Descriptors / Indicators: Aching;Discomfort Pain Intervention(s): Repositioned; Rest ADL: ADL ADL Comments: Please see functional navigator  See Function Navigator for Current Functional Status.   Therapy/Group: Individual Therapy  Catherine Butler 06/29/2017, 1:03 PM

## 2017-06-29 NOTE — Progress Notes (Signed)
Social Work Patient ID: Catherine Butler, female   DOB: 01/26/1961, 56 y.o.   MRN: [id number]  Met with pt to discuss team conference goals mod/i-supervision level and target discharge 1/16. She can see the progress she is making and is very pleased with this. She is quite flat when discussing this but seems to be happy about walking in therapies. Will work on equipment needs and connecting pt to a medical clinic to assist with her medications prior to her medicaid being approved. Will try to get her mom to come in prior to her discharge to see her in therapies.  

## 2017-06-29 NOTE — Progress Notes (Signed)
Physical Therapy Session Note  Patient Details  Name: Patriciaann ClanKelly D Fehnel MRN: [id number] Date of Birth: 11/01/1960  Today's Date: 06/29/2017 PT Individual Time: 0915-1015 PT Individual Time Calculation (min): 60 min   Short Term Goals: Week 1:  PT Short Term Goal 1 (Week 1): Pt will transfer with min assist PT Short Term Goal 2 (Week 1): Pt will ambulate 3030' with LRAD and mod assist PT Short Term Goal 3 (Week 1): Pt will initiate stair training with PT PT Short Term Goal 4 (Week 1): Pt will propel w/c 100' with supervision.   Skilled Therapeutic Interventions/Progress Updates:    Pt supine in bed, agreeable to participate in therapy session. Pt reports she has some pain today but it is "not bad", not rated. Bed mobility SBA, sit to stand Min Assist to RW for balance. Ambulation to bathroom with RW and Min Assist, minor unsteadiness on her feet. Toilet transfer Mod Assist without use of toilet riser/commode. Supine BLE and low back stretches and therex x 10-15 reps: lumbar rotations, heel slides, hip abd/add, bridges. Pt reports need to use the bathroom again. Mod Assist for toilet transfer due to low height with use of grab bars and RW. Pt is Min Assist for balance while performing pericare and clothing management. Seated BLE therex x 10-15 reps. Pt left seated in w/c in room with needs in reach. Put commode back over toilet to increase height so pt can be more independent with toilet transfers.  Therapy Documentation Precautions:  Precautions Precautions: Fall Restrictions Weight Bearing Restrictions: No  See Function Navigator for Current Functional Status.   Therapy/Group: Individual Therapy  Peter Congoaylor Bailey Faiella, PT, DPT  06/29/2017, 12:30 PM

## 2017-06-29 NOTE — Patient Care Conference (Signed)
Inpatient RehabilitationTeam Conference and Plan of Care Update Date: 06/29/2017   Time: 11:10 AM    Patient Name: Catherine Butler      Medical Record Number: [id number]  Date of Birth: 07/23/1960 Sex: Female         Room/Bed: 4W25C/4W25C-01 Payor Info: Payor: MEDICAID PENDING / Plan: MEDICAID PENDING / Product Type: *No Product type* /    Admitting Diagnosis: DEbility with GBS  Admit Date/Time:  06/23/2017  3:16 PM Admission Comments: No comment available   Primary Diagnosis:  <principal problem not specified> Principal Problem: <principal problem not specified>  Patient Active Problem List   Diagnosis Date Noted  . Debility 06/23/2017  . Constipation   . Quadriplegia and quadriparesis (HCC)   . Guillain Barr syndrome (HCC)   . Nausea & vomiting 06/21/2017  . Hypokalemia 06/21/2017  . Volume depletion 06/21/2017  . Sinus tachycardia 06/21/2017  . Nausea and vomiting 06/21/2017  . Essential hypertension 04/14/2017  . Paresthesia   . 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 Abdominal pain 01/01/2017  . Sepsis (HCC) 01/01/2017  . Tobacco abuse 01/01/2017  . Abnormal LFTs 01/01/2017  . Depression   . COPD (chronic obstructive pulmonary disease) (HCC) 06/16/2015    Expected Discharge Date: Expected Discharge Date: 07/06/17  Team Members Present: Physician leading conference: Dr. Claudette LawsAndrew Kirsteins Social Worker Present: Dossie DerBecky Dabney Schanz, LCSW Nurse Present: Chrissie NoaMelanie Barnes, RN PT Present: Midge MiniumBen Zaino, PT OT Present: Kearney HardElisabeth Doe, OT SLP Present: Feliberto Gottronourtney Payne, SLP PPS Coordinator present : Tora DuckMarie Noel, RN, CRRN     Current Status/Progress Goal Weekly Team Focus  Medical   anxiety and depression , hx substance abuse, ataxia and quadriparesis  reduce fall risk, manage mood d/o  neuropathic pain management   Bowel/Bladder   continent of b/b, LBM 1/7, frequency of urine, PVR 22cc, not retaining  maintain b/b with mod assist  montitor b/b q shift  and prn   Swallow/Nutrition/ Hydration             ADL's   Min A overall  Supervision/ Min A  Modified bathing/dressing, activity tolerance, functional ambulation   Mobility   Min Assist with RW for transfers, ambulating 50 ft with RW and Min Assist  Supervision  activity tolerance, gait training, coordination   Communication             Safety/Cognition/ Behavioral Observations            Pain   chronic pain and neuropathy, neurontin d/c'd and lyrica started today.  Norco and robaxin taken as needed, typically 1-2x/shift  maintain pain at or less than 2 with mod assist  monitor pain q shift and prn   Skin   no issues with skin  no skin breakdown while on IPR  monitor skin q shift and prn      *See Care Plan and progress notes for long and short-term goals.     Barriers to Discharge  Current Status/Progress Possible Resolutions Date Resolved   Physician    Medication compliance;Behavior;Decreased caregiver support  need to avoid narcotics due to substance abuse hx  progressing toward goals  neuropsych to follow      Nursing                  PT  Decreased caregiver support;Home environment access/layout;Behavior  needs encouragement to engage in therapy sessions              OT  SLP                SW                Discharge Planning/Teaching Needs:  HOme with elderly parents-mom was assisitng prior to admission. Pt needs to be as high level as possible beofre going home      Team Discussion:  Goals of mod/i-some supervision. Inconsistent with pain and when experiencing it. Poor po intake encourages fluid. MD trying lyrica today for her neuropathy. Neuro-psych seeing for coping. Needs support and pushing to do all she can in therapies. At times self limiting.  Revisions to Treatment Plan:  DC 1/16    Continued Need for Acute Rehabilitation Level of Care: The patient requires daily medical management by a physician with specialized training in physical  medicine and rehabilitation for the following conditions: Daily direction of a multidisciplinary physical rehabilitation program to ensure safe treatment while eliciting the highest outcome that is of practical value to the patient.: Yes Daily medical management of patient stability for increased activity during participation in an intensive rehabilitation regime.: Yes Daily analysis of laboratory values and/or radiology reports with any subsequent need for medication adjustment of medical intervention for : Neurological problems;Mood/behavior problems  Dazja Houchin, Lemar Livings 06/29/2017, 1:12 PM

## 2017-06-30 ENCOUNTER — Inpatient Hospital Stay (HOSPITAL_COMMUNITY): Payer: Medicaid Other | Admitting: *Deleted

## 2017-06-30 ENCOUNTER — Inpatient Hospital Stay (HOSPITAL_COMMUNITY): Payer: Self-pay | Admitting: Occupational Therapy

## 2017-06-30 ENCOUNTER — Inpatient Hospital Stay (HOSPITAL_COMMUNITY): Payer: Self-pay | Admitting: Physical Therapy

## 2017-06-30 LAB — CBC
HEMATOCRIT: 31.8 % — AB (ref 36.0–46.0)
HEMOGLOBIN: 10.4 g/dL — AB (ref 12.0–15.0)
MCH: 29.3 pg (ref 26.0–34.0)
MCHC: 32.7 g/dL (ref 30.0–36.0)
MCV: 89.6 fL (ref 78.0–100.0)
Platelets: 243 10*3/uL (ref 150–400)
RBC: 3.55 MIL/uL — AB (ref 3.87–5.11)
RDW: 13.4 % (ref 11.5–15.5)
WBC: 2.9 10*3/uL — ABNORMAL LOW (ref 4.0–10.5)

## 2017-06-30 MED ORDER — ALBUTEROL SULFATE (2.5 MG/3ML) 0.083% IN NEBU
2.5000 mg | INHALATION_SOLUTION | Freq: Two times a day (BID) | RESPIRATORY_TRACT | Status: DC
  Administered 2017-06-30: 2.5 mg via RESPIRATORY_TRACT
  Filled 2017-06-30: qty 3

## 2017-06-30 MED ORDER — GABAPENTIN 400 MG PO CAPS
800.0000 mg | ORAL_CAPSULE | Freq: Three times a day (TID) | ORAL | Status: DC
  Administered 2017-06-30 – 2017-07-06 (×19): 800 mg via ORAL
  Filled 2017-06-30 (×19): qty 2

## 2017-06-30 MED ORDER — ENSURE ENLIVE PO LIQD
237.0000 mL | Freq: Three times a day (TID) | ORAL | Status: DC
  Administered 2017-06-30 – 2017-07-05 (×11): 237 mL via ORAL

## 2017-06-30 MED ORDER — ALBUTEROL SULFATE (2.5 MG/3ML) 0.083% IN NEBU
2.5000 mg | INHALATION_SOLUTION | Freq: Four times a day (QID) | RESPIRATORY_TRACT | Status: DC
  Administered 2017-06-30: 2.5 mg via RESPIRATORY_TRACT
  Filled 2017-06-30: qty 3

## 2017-06-30 NOTE — Plan of Care (Signed)
  [facility] PAIN MANAGEMENT [facility] PAIN MANAGED AT OR BELOW PT'S PAIN GOAL Description Less than 2  06/30/2017 1427 - Not Progressing by Pollyann SavoyLloyd, Eathel Pajak D, RN  MD ordered new pain regimen, today.

## 2017-06-30 NOTE — Progress Notes (Signed)
Subjective/Complaints:    ROS-  Neg CP, SOB, N/V/D  Objective: Vital Signs: Blood pressure 92/66, pulse (!) 112, temperature 98 F (36.7 C), temperature source Oral, resp. rate 20, height 5\' 3"  (1.6 m), weight 62.5 kg (137 lb 12.6 oz), last menstrual period 02/22/2006, SpO2 99 %. No results found. Results for orders placed or performed during the hospital encounter of 06/23/17 (from the past 72 hour(s))  CBC     Status: Abnormal   Collection Time: 06/30/17  2:18 AM  Result Value Ref Range   WBC 2.9 (L) 4.0 - 10.5 K/uL   RBC 3.55 (L) 3.87 - 5.11 MIL/uL   Hemoglobin 10.4 (L) 12.0 - 15.0 g/dL   HCT 40.9 (L) 81.1 - 91.4 %   MCV 89.6 78.0 - 100.0 fL   MCH 29.3 26.0 - 34.0 pg   MCHC 32.7 30.0 - 36.0 g/dL   RDW 78.2 95.6 - 21.3 %   Platelets 243 150 - 400 K/uL    Head:Normocephalicand atraumatic.  Mouth/Throat:Oropharynx is clear and moist. Multiple missing teeth. Eyes:Conjunctivaeare normal. Pupils are equal, round, and reactive to light.  Neck:Normal range of motion.Neck supple.  Cardiovascular:Regular rhythm.Tachycardiapresent.  Respiratory:Effort normaland breath sounds normal. Nostridor. Norespiratory distress. She hasno wheezes.  YQ:MVHQ.Bowel sounds are normal.  Musculoskeletal: She exhibits noedema.  Neurological: She isalertand oriented to person, place, and time.Coordinationnormal.  Speech clear. Anxious. BUE with decreased motor control and distal weakness. Skin: Skin iswarmand dry. She isnot diaphoretic.  Psychiatric: [name] is normal. Her mood appearsanxious. She expressesinappropriate judgment. She isinattentive. Mild ataxia right finger-nose-finger and left finger-nose-finger Moderate ataxia bilateral heel to shin Sensation reduced to proprioception left great toe but intact in the right great toe light touch is intact bilaterally in upper and lower limbs.Allodynia B hands Motor strength is 4/5 bilateral deltoid bicep tricep  finger flexors and extensors 3/5 bilateral hip flexor knee extensor ankle dorsiflexor plantar flexor Able to squeeze without hand pain     Assessment/Plan: 1. Functional deficits secondary to quadriparesis which require 3+ hours per day of interdisciplinary therapy in a comprehensive inpatient rehab setting. Physiatrist is providing close team supervision and 24 hour management of active medical problems listed below. Physiatrist and rehab team continue to assess barriers to discharge/monitor patient progress toward functional and medical goals. FIM: Function - Bathing Bathing activity did not occur: Refused Position: Shower Body parts bathed by patient: Right arm, Left arm, Chest, Abdomen, Front perineal area, Buttocks, Right upper leg, Left upper leg, Left lower leg, Right lower leg Body parts bathed by helper: Right lower leg, Left lower leg, Back Assist Level: Supervision or verbal cues  Function- Upper Body Dressing/Undressing What is the patient wearing?: Pull over shirt/dress Pull over shirt/dress - Perfomed by patient: Thread/unthread right sleeve, Thread/unthread left sleeve, Put head through opening, Pull shirt over trunk Pull over shirt/dress - Perfomed by helper: Put head through opening, Pull shirt over trunk Assist Level: More than reasonable time Set up : To obtain clothing/put away Function - Lower Body Dressing/Undressing What is the patient wearing?: Pants, Non-skid slipper socks Position: Sitting EOB Pants- Performed by patient: Thread/unthread right pants leg, Pull pants up/down, Thread/unthread left pants leg Pants- Performed by helper: Thread/unthread right pants leg, Thread/unthread left pants leg, Pull pants up/down Non-skid slipper socks- Performed by patient: Don/doff right sock, Don/doff left sock Non-skid slipper socks- Performed by helper: Don/doff right sock, Don/doff left sock Assist for footwear: Supervision/touching assist Assist for lower body  dressing: Touching or steadying assistance (Pt > 75%)  Function - Toileting Toileting activity did not occur: No continent bowel/bladder event Toileting steps completed by patient: Adjust clothing prior to toileting, Performs perineal hygiene, Adjust clothing after toileting Toileting steps completed by helper: Adjust clothing prior to toileting, Adjust clothing after toileting Toileting Assistive Devices: Grab bar or rail Assist level: Supervision or verbal cues  Function - ArchivistToilet Transfers Toilet transfer activity did not occur: Refused Toilet transfer assistive device: Elevated toilet seat/BSC over toilet, Grab bar Assist level to toilet: Touching or steadying assistance (Pt > 75%) Assist level from toilet: Touching or steadying assistance (Pt > 75%)  Function - Chair/bed transfer Chair/bed transfer method: Stand pivot Chair/bed transfer assist level: Touching or steadying assistance (Pt > 75%) Chair/bed transfer assistive device: Armrests, Walker Chair/bed transfer details: Verbal cues for sequencing, Verbal cues for precautions/safety  Function - Locomotion: Wheelchair Will patient use wheelchair at discharge?: Yes Type: Manual Max wheelchair distance: 150' Assist Level: Supervision or verbal cues Wheel 50 feet with 2 turns activity did not occur: Refused Assist Level: Supervision or verbal cues Wheel 150 feet activity did not occur: Refused Assist Level: Supervision or verbal cues Turns around,maneuvers to table,bed, and toilet,negotiates 3% grade,maneuvers on rugs and over doorsills: No Function - Locomotion: Ambulation Assistive device: Walker-rolling Max distance: 40' Assist level: Touching or steadying assistance (Pt > 75%) Assist level: Touching or steadying assistance (Pt > 75%) Walk 50 feet with 2 turns activity did not occur: Safety/medical concerns Assist level: Touching or steadying assistance (Pt > 75%) Walk 150 feet activity did not occur: Safety/medical  concerns Walk 10 feet on uneven surfaces activity did not occur: Safety/medical concerns  Function - Comprehension Comprehension: Auditory Comprehension assist level: Follows complex conversation/direction with extra time/assistive device  Function - Expression Expression: Verbal Expression assist level: Expresses complex ideas: With extra time/assistive device  Function - Social Interaction Social Interaction assist level: Interacts appropriately with others with medication or extra time (anti-anxiety, antidepressant).  Function - Problem Solving Problem solving assist level: Solves complex problems: With extra time  Function - Memory Memory assist level: Complete Independence: No helper Patient normally able to recall (first 3 days only): Current season, Location of own room, That he or she is in a hospital, Staff names and faces   Medical Problem List and Plan: 1.   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 2. DVT Prophylaxis/Anticoagulation: Pharmaceutical:Lovenox 3. Pain Management:switch  Lyrica to gabapentin for insurance reasons.using prn hydrocodone around the clock, will change to tramadol for D/C  4. Mood:LCSW to provide ego support. LCSW to follow for evaluation and support. 5. Neuropsych: This patientiscapable of making decisions on herown behalf. 6. Skin/Wound Care:routine pressure relief measurs 7. Fluids/Electrolytes/Nutrition:Recheck lytes weekly 8. ABLA: Recheck CBC weekly.Check 1/10 9. H/o Constipation: CT with evidence of constipation. Hasn't had BM yest. Will schedule miralax daily.  10. Urinary retention and UTI. Monitor voiding with PVR checks.>100K ecoli S to cefazolin                  11. Anxiety disorder: Continue Remeron and Seroquel at bedtime. Tachycardia/elevated BP due to pain and anxiety at admission. Recheck vitals.  Sees Dr. Dub MikesLugo at behavioral health recommend neuropsych eval 12. Neuropathy: continue Gabapentin 600  mg qid.no sig limitation on fxn 13.  History of alcohol abuse has CT abdomen evidence of fatty liver plus chronic pancreatitis.  Patient states that she last drank heavily a couple years ago, with lighter alcohol use last around 3 or 4 months ago 14.  Tobacco abuse will start NicoDerm patchon 1/4 15.  Sinus tachycardia, mild asymptomatic no tx for now , BP on low side, may be related to pain and anxiety - no BB 16.  Constipation improved on Senna S BID, last BM yest pm  LOS (Days) 7 A FACE TO FACE EVALUATION WAS PERFORMED  Erick Colace 06/30/2017, 8:41 AM

## 2017-06-30 NOTE — Progress Notes (Signed)
Occupational Therapy Session Note  Patient Details  Name: Catherine Butler MRN: [id number] Date of Birth: 1961-01-01  Today's Date: 06/30/2017 OT Individual Time: 9166-0600 OT Individual Time Calculation (min): 68 min   Short Term Goals: Week 1:  OT Short Term Goal 1 (Week 1): Pt will complete toilet transfe with min A OT Short Term Goal 2 (Week 1): Pt will tolerate standing for 1 minute while participating in BADL task OT Short Term Goal 3 (Week 1): Pt will complete 1/3 steps of toileting task OT Short Term Goal 4 (Week 1): Pt will complete tub bench transfer with min A  Skilled Therapeutic Interventions/Progress Updates:    Pt greeted asleep in bed upon OT arrival. Pt reports dizziness and increased UE tremors today. BP taken in sitting and standing w/o orthostasis however, pt's resting HR jumped from 116 to 161 after 1 minute of standing. Pt took seated rest break and HR quickly decreased to 130's. Nursing notified. Pt ambulated to bathroom with min guard A and RW, highest HR noted was 131. Pt voided bladder successfully and completed 3/3 toileting steps with supervision. Pt rested again, then worked on [employer] strength/coordination with wc propulsion in hallway. Pt assist to therapy apartment 2/2 fatigue and worked on BUE coordination and strength to scoop out jello into smaller bowls for pt to share with grandchildren who entered apartment to visit. Standing balance/endurance with standing dish washing tasks. Pt returned to room at end of session and left seated in wc with needs met and family present.   Therapy Documentation Precautions:  Precautions Precautions: Fall Restrictions Weight Bearing Restrictions: No Vital Signs: Therapy Vitals Pulse Rate: (!) 132(after ambulating) Resp: 18 BP: 103/81 Patient Position (if appropriate): Standing Oxygen Therapy SpO2: 99 % O2 Device: Not Delivered  See Function Navigator for Current Functional Status.   Therapy/Group: Individual  Therapy  Valma Cava 06/30/2017, 2:54 PM

## 2017-06-30 NOTE — Progress Notes (Signed)
Nutrition Follow-up  DOCUMENTATION CODES:   Not applicable  INTERVENTION:  Provide Ensure Enlive po TID, each supplement provides 350 kcal and 20 grams of protein.  Continue 30 ml Prostat po BID, each supplement provides 100 kcal and 15 grams of protein.   Encourage adequate PO intake.   NUTRITION DIAGNOSIS:   Inadequate oral intake related to poor appetite as evidenced by per patient/family report; ongoing  GOAL:   Patient will meet greater than or equal to 90% of their needs; progressing  MONITOR:   PO intake, Supplement acceptance, Labs, Weight trends, I & O's, Skin  REASON FOR ASSESSMENT:   Malnutrition Screening Tool    ASSESSMENT:   57 y.o. female with history of anxiety disorder, depression, alcohol abuse, COPD, [facility] with back pain, progressive BUE/BLE paraesthesias, difficulty with walking, bowel incontinence and urinary retention. Also with reports of recent flu vaccine and was treated with IVIG due to suspicion of GBS and discharged to SNF with recommendations of EMG by neurology on outpatient basis. She was discharged to home few weeks ago but [facility] with nausea/vomiting  X four day and weakness. CT abdomen done revealing hepatic steatosis, stigmata of chronic pancreatitis with pancreatitic calcifications, increased fecal retention and stable moderate HH.   Meal completion has been 5-25%. Pt cunrrently has Ensure and Prostat ordered and has been consuming them. RD to increase Ensure to TID to aid in adequate nutrition needs. Pt encouraged to eat her food at meals and to drink her supplements.   Labs and medications reviewed.   Diet Order:  Diet regular Room service appropriate? Yes; Fluid consistency: Thin  EDUCATION NEEDS:   Not appropriate for education at this time  Skin:  Skin Assessment: Reviewed RN Assessment  Last BM:  1/9  Height:   Ht Readings from Last 1 Encounters:  06/23/17 5\' 3"  (1.6 m)     Weight:   Wt Readings from Last 1 Encounters:  06/30/17 137 lb 12.6 oz (62.5 kg)    Ideal Body Weight:  52.27 kg  BMI:  Body mass index is 24.41 kg/m.  Estimated Nutritional Needs:   Kcal:  1800-2000  Protein:  80-90 grams  Fluid:  1.8 - 2 L/day    Roslyn SmilingStephanie Chava Dulac, MS, RD, LDN Pager # [phone] After hours/ weekend pager # [phone]

## 2017-06-30 NOTE — Progress Notes (Signed)
Occupational Therapy Session Note  Patient Details  Name: Catherine Butler MRN: [id number] Date of Birth: 09/11/1960  Today's Date: 06/30/2017 OT Individual Time: 7829-56210830-0930 OT Individual Time Calculation (min): 60 min    Short Term Goals: Week 1:  OT Short Term Goal 1 (Week 1): Pt will complete toilet transfe with min A OT Short Term Goal 2 (Week 1): Pt will tolerate standing for 1 minute while participating in BADL task OT Short Term Goal 3 (Week 1): Pt will complete 1/3 steps of toileting task OT Short Term Goal 4 (Week 1): Pt will complete tub bench transfer with min A Week 2:     Skilled Therapeutic Interventions/Progress Updates:    1:1 Self care retraining to including dressing sit to stand. Pt able to complete with min guard. Declined bathing today. Performed brushing hair and tooth brushing in standing with close supervision/ min guard.  Therapeutic activity focus on balance, standing tolerance, activity tolerance/ endurance in prep for ADL tasks.  -Propelled w/c with bilateral feet from room to RN station with more than reasonable amt of time for LEs strengthening  -ambulated the rest of the way with increased time and min A   -Pt transferred onto kinetron and perform in sitting for 1 min on easier resistance x2.   -Transitioned into standing on kinetron for 30 seconds 2x  transitioned to stepping up on one 1 inche step and back 10x with min A  -transitioned to stepping on 6 inch step 7x with mod A - unable to complete more due to fatigue   -functional ambulation with RW another ~100 feet before needing to sit. REturned to room via w/c  Therapy Documentation Precautions:  Precautions Precautions: Fall Restrictions Weight Bearing Restrictions: No Pain: Pain Assessment Pain Score: 6   In her left big toe- RN made aware ADL: ADL ADL Comments: Please see functional navigator  See Function Navigator for Current Functional Status.   Therapy/Group: Individual  Therapy  Roney MansSmith, [name] [facility] 06/30/2017, 2:33 PM

## 2017-06-30 NOTE — Progress Notes (Signed)
Physical Therapy Session Note  Patient Details  Name: Catherine Butler MRN: [id number] Date of Birth: 06/07/1961  Today's Date: 06/30/2017 PT Individual Time: 1000-1100 PT Individual Time Calculation (min): 60 min   Short Term Goals: Week 1:  PT Short Term Goal 1 (Week 1): Pt will transfer with min assist PT Short Term Goal 2 (Week 1): Pt will ambulate 3930' with LRAD and mod assist PT Short Term Goal 3 (Week 1): Pt will initiate stair training with PT PT Short Term Goal 4 (Week 1): Pt will propel w/c 100' with supervision.   Skilled Therapeutic Interventions/Progress Updates:    Pt supine in bed, agreeable to participate in therapy session. Pt reports 8/10 pain in hands and BLE this AM, just received pain medication. Pt reports feeling more fatigued this AM as well. Assisted pt with functional task of ordering her lunch. Pt transfers with CGA to Min Assist to RW, v/c for safe use of DME and hand placement during transfer. Toilet transfer CGA, CGA for standing balance with RW while performing clothing management. Ambulation 2 x 3650' with RW and Min Assist, ataxic gait pattern and max encouragement to increase distance. Supine BLE therex and low back stretches x 10-15 reps. Standing 4" alternating L/R step-taps forwards and laterally. Pt requires encouragement to engage in therapy session and perseverates on neuropathic pain. Pt left supine in bed with needs in reach, bed alarm in place.  Therapy Documentation Precautions:  Precautions Precautions: Fall Restrictions Weight Bearing Restrictions: No  See Function Navigator for Current Functional Status.   Therapy/Group: Individual Therapy  Peter Congoaylor Pamula Luther, PT, DPT  06/30/2017, 12:26 PM

## 2017-07-01 ENCOUNTER — Inpatient Hospital Stay (HOSPITAL_COMMUNITY): Payer: Self-pay

## 2017-07-01 ENCOUNTER — Inpatient Hospital Stay (HOSPITAL_COMMUNITY): Payer: Self-pay | Admitting: Occupational Therapy

## 2017-07-01 ENCOUNTER — Inpatient Hospital Stay (HOSPITAL_COMMUNITY): Payer: Self-pay | Admitting: Physical Therapy

## 2017-07-01 LAB — BASIC METABOLIC PANEL
Anion gap: 14 (ref 5–15)
BUN: 10 mg/dL (ref 6–20)
CHLORIDE: 102 mmol/L (ref 101–111)
CO2: 20 mmol/L — ABNORMAL LOW (ref 22–32)
Calcium: 8.5 mg/dL — ABNORMAL LOW (ref 8.9–10.3)
Creatinine, Ser: 0.59 mg/dL (ref 0.44–1.00)
GFR calc Af Amer: >60 mL/min (ref >60)
GFR calc non Af Amer: >60 mL/min (ref >60)
GLUCOSE: 98 mg/dL (ref 65–99)
POTASSIUM: 2.9 mmol/L — AB (ref 3.5–5.1)
Sodium: 136 mmol/L (ref 135–145)

## 2017-07-01 MED ORDER — ALBUTEROL SULFATE (2.5 MG/3ML) 0.083% IN NEBU
2.5000 mg | INHALATION_SOLUTION | RESPIRATORY_TRACT | Status: DC | PRN
  Administered 2017-07-01 – 2017-07-03 (×2): 2.5 mg via RESPIRATORY_TRACT
  Filled 2017-07-01 (×2): qty 3

## 2017-07-01 NOTE — Progress Notes (Signed)
Occupational Therapy Session Note  Patient Details  Name: Catherine Butler MRN: [id number] Date of Birth: 1961-03-29  Today's Date: 07/01/2017 OT Individual Time: 1700-1749 OT Individual Time Calculation (min): 58 min    Short Term Goals: Week 1:  OT Short Term Goal 1 (Week 1): Pt will complete toilet transfe with min A OT Short Term Goal 2 (Week 1): Pt will tolerate standing for 1 minute while participating in BADL task OT Short Term Goal 3 (Week 1): Pt will complete 1/3 steps of toileting task OT Short Term Goal 4 (Week 1): Pt will complete tub bench transfer with min A  Skilled Therapeutic Interventions/Progress Updates:    1:1. Pt requesting to toilet upon arrival. Pt ambulates with supervision to transfer onto toilet and complete 3/3 components of toileting. Pt seated in w/c to groom wit increased time utilizing foam handle on tooothbrush for improved control d/t decreased grip strength. Also applied foam handles to w/c breaks so pt able to independently lock/unlock for safety. Pt dons pull over shirt with increased time. OT dons ted hose with verbal order from MD to help with orthostasis. Pt dons socks with VC to roll ends of long socks as a "handle" to hold onto while pulling over heel to improve independence. Exited session with pt seated in bed, call light in reach and all needs met.   Therapy Documentation Other Treatments:    See Function Navigator for Current Functional Status.   Therapy/Group: Individual Therapy  Tonny Branch 07/01/2017, 8:45 AM

## 2017-07-01 NOTE — Progress Notes (Signed)
Subjective/Complaints:  OT notes elevated HR, mild dizziness , able to do ADL program  ROS-  Neg CP, SOB, N/V/D  Objective: Vital Signs: Blood pressure 103/81, pulse (!) 121, temperature 98 F (36.7 C), temperature source Oral, resp. rate 18, height 5\' 3"  (1.6 m), weight 62.5 kg (137 lb 12.6 oz), last menstrual period 02/22/2006, SpO2 98 %. No results found. Results for orders placed or performed during the hospital encounter of 06/23/17 (from the past 72 hour(s))  CBC     Status: Abnormal   Collection Time: 06/30/17  2:18 AM  Result Value Ref Range   WBC 2.9 (L) 4.0 - 10.5 K/uL   RBC 3.55 (L) 3.87 - 5.11 MIL/uL   Hemoglobin 10.4 (L) 12.0 - 15.0 g/dL   HCT 16.1 (L) 09.6 - 04.5 %   MCV 89.6 78.0 - 100.0 fL   MCH 29.3 26.0 - 34.0 pg   MCHC 32.7 30.0 - 36.0 g/dL   RDW 40.9 81.1 - 91.4 %   Platelets 243 150 - 400 K/uL    Head:Normocephalicand atraumatic.  Mouth/Throat:Oropharynx is clear and moist. Multiple missing teeth. Eyes:Conjunctivaeare normal. Pupils are equal, round, and reactive to light.  Neck:Normal range of motion.Neck supple.  Cardiovascular:Regular rhythm.Tachycardiapresent.  Respiratory:Effort normaland breath sounds normal. Nostridor. Norespiratory distress. She hasno wheezes.  NW:GNFA.Bowel sounds are normal.  Musculoskeletal: She exhibits noedema.  Neurological: She isalertand oriented to person, place, and time.Coordinationnormal.  Speech clear. Anxious. BUE with decreased motor control and distal weakness. Skin: Skin iswarmand dry. She isnot diaphoretic.  Psychiatric: [name] is normal. Her mood appearsanxious. She expressesinappropriate judgment. She isinattentive. Mild ataxia right finger-nose-finger and left finger-nose-finger Moderate ataxia bilateral heel to shin Sensation reduced to proprioception left great toe but intact in the right great toe light touch is intact bilaterally in upper and lower limbs.Allodynia  B hands Motor strength is 4/5 bilateral deltoid bicep tricep finger flexors and extensors 3/5 bilateral hip flexor knee extensor ankle dorsiflexor plantar flexor Able to squeeze without hand pain     Assessment/Plan: 1. Functional deficits secondary to quadriparesis which require 3+ hours per day of interdisciplinary therapy in a comprehensive inpatient rehab setting. Physiatrist is providing close team supervision and 24 hour management of active medical problems listed below. Physiatrist and rehab team continue to assess barriers to discharge/monitor patient progress toward functional and medical goals. FIM: Function - Bathing Bathing activity did not occur: Refused Position: Shower Body parts bathed by patient: Right arm, Left arm, Chest, Abdomen, Front perineal area, Buttocks, Right upper leg, Left upper leg, Left lower leg, Right lower leg Body parts bathed by helper: Right lower leg, Left lower leg, Back Assist Level: Supervision or verbal cues  Function- Upper Body Dressing/Undressing What is the patient wearing?: Pull over shirt/dress Pull over shirt/dress - Perfomed by patient: Thread/unthread right sleeve, Thread/unthread left sleeve, Put head through opening, Pull shirt over trunk Pull over shirt/dress - Perfomed by helper: Put head through opening, Pull shirt over trunk Assist Level: More than reasonable time Set up : To obtain clothing/put away Function - Lower Body Dressing/Undressing What is the patient wearing?: Pants, Non-skid slipper socks Position: Sitting EOB Pants- Performed by patient: Thread/unthread right pants leg, Pull pants up/down, Thread/unthread left pants leg Pants- Performed by helper: Thread/unthread right pants leg, Thread/unthread left pants leg, Pull pants up/down Non-skid slipper socks- Performed by patient: Don/doff right sock, Don/doff left sock Non-skid slipper socks- Performed by helper: Don/doff right sock, Don/doff left sock Assist for  footwear: Supervision/touching assist Assist for  lower body dressing: Touching or steadying assistance (Pt > 75%)  Function - Toileting Toileting activity did not occur: No continent bowel/bladder event Toileting steps completed by patient: Adjust clothing prior to toileting, Performs perineal hygiene, Adjust clothing after toileting Toileting steps completed by helper: Adjust clothing prior to toileting, Adjust clothing after toileting Toileting Assistive Devices: Grab bar or rail Assist level: Touching or steadying assistance (Pt.75%)  Function - ArchivistToilet Transfers Toilet transfer activity did not occur: Refused Toilet transfer assistive device: Elevated toilet seat/BSC over toilet, Grab bar Assist level to toilet: Supervision or verbal cues Assist level from toilet: Supervision or verbal cues  Function - Chair/bed transfer Chair/bed transfer method: Stand pivot Chair/bed transfer assist level: Touching or steadying assistance (Pt > 75%) Chair/bed transfer assistive device: Armrests, Walker Chair/bed transfer details: Tactile cues for initiation, Verbal cues for safe use of DME/AE  Function - Locomotion: Wheelchair Will patient use wheelchair at discharge?: Yes Type: Manual Max wheelchair distance: 50' Assist Level: Supervision or verbal cues Wheel 50 feet with 2 turns activity did not occur: Refused Assist Level: Supervision or verbal cues Wheel 150 feet activity did not occur: Refused Assist Level: Supervision or verbal cues Turns around,maneuvers to table,bed, and toilet,negotiates 3% grade,maneuvers on rugs and over doorsills: No Function - Locomotion: Ambulation Assistive device: Walker-rolling Max distance: 3450' Assist level: Touching or steadying assistance (Pt > 75%) Assist level: Touching or steadying assistance (Pt > 75%) Walk 50 feet with 2 turns activity did not occur: Safety/medical concerns Assist level: Touching or steadying assistance (Pt > 75%) Walk 150 feet  activity did not occur: Safety/medical concerns Walk 10 feet on uneven surfaces activity did not occur: Safety/medical concerns  Function - Comprehension Comprehension: Auditory Comprehension assist level: Follows complex conversation/direction with extra time/assistive device  Function - Expression Expression: Verbal Expression assist level: Expresses complex ideas: With extra time/assistive device  Function - Social Interaction Social Interaction assist level: Interacts appropriately with others with medication or extra time (anti-anxiety, antidepressant).  Function - Problem Solving Problem solving assist level: Solves complex problems: With extra time  Function - Memory Memory assist level: More than reasonable amount of time Patient normally able to recall (first 3 days only): Current season, Location of own room, That he or she is in a hospital, Staff names and faces   Medical Problem List and Plan: 1.   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 2. DVT Prophylaxis/Anticoagulation: Pharmaceutical:Lovenox 3. Pain Management:switch  Lyrica to gabapentin for insurance reasons.using prn hydrocodone around the clock, will change to tramadol for D/C  4. Mood:LCSW to provide ego support. LCSW to follow for evaluation and support. 5. Neuropsych: This patientiscapable of making decisions on herown behalf. 6. Skin/Wound Care:routine pressure relief measurs 7. Fluids/Electrolytes/Nutrition:Recheck lytes weekly 8. ABLA: Recheck CBC weekly.Check 1/10 9. H/o Constipation: CT with evidence of constipation. Hasn't had BM yest. Will schedule miralax daily.  10. Urinary retention and UTI. Monitor voiding with PVR checks.>100K ecoli S to cefazolin                  11. Anxiety disorder: Continue Remeron and Seroquel at bedtime. Tachycardia/elevated BP due to pain and anxiety at admission. Recheck vitals.  Sees Dr. Dub MikesLugo at behavioral health recommend neuropsych  eval 12. Neuropathy: continue Gabapentin 600 mg qid.no sig limitation on fxn 13.  History of alcohol abuse has CT abdomen evidence of fatty liver plus chronic pancreatitis.  Patient states that she last drank heavily a couple years ago, with  lighter alcohol use last around 3 or 4 months ago 14.  Tobacco abuse will start NicoDerm patchon 1/4 15.  Sinus tachycardia,  asymptomatic no tx for now , BP on low side, may be related to pain and anxiety - no BB due to BP.  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  Constipation improved on Senna S BID, BM this am  LOS (Days) 8 A FACE TO FACE EVALUATION WAS PERFORMED  Erick Colace 07/01/2017, 8:39 AM

## 2017-07-01 NOTE — Progress Notes (Signed)
Occupational Therapy Weekly Progress Note  Patient Details  Name: Catherine Butler MRN: [id number] Date of Birth: 09/15/1960  Beginning of progress report period: June 24, 2017 End of progress report period: July 01, 2017  Today's Date: 07/01/2017 OT Individual Time: 1415-1530 OT Individual Time Calculation (min): 75 min    Patient has met 4 of 4 short term goals.  Pt is making steady progress with OT treatments at this time.  She is able to complete all transfers with use of the RW and min guard A.   Supervision for sit to to stand with min instructional cueing for hand placement.  Pt's main limitation is low activity tolerance..  Will continue with current OT treatment POC.     Patient continues to demonstrate the following deficits: muscle weakness, ataxia, decreased coordination and activity tolerance and decreased standing balance, decreased postural control and decreased balance strategies and therefore will continue to benefit from skilled OT intervention to enhance overall performance with BADL.  Patient progressing toward long term goals..  Continue plan of care.  OT Short Term Goals Week 1:  OT Short Term Goal 1 (Week 1): Pt will complete toilet transfe with min A OT Short Term Goal 1 - Progress (Week 1): Met OT Short Term Goal 2 (Week 1): Pt will tolerate standing for 1 minute while participating in BADL task OT Short Term Goal 2 - Progress (Week 1): Met OT Short Term Goal 3 (Week 1): Pt will complete 1/3 steps of toileting task OT Short Term Goal 3 - Progress (Week 1): Met OT Short Term Goal 4 (Week 1): Pt will complete tub bench transfer with min A OT Short Term Goal 4 - Progress (Week 1): Met Week 2:  OT Short Term Goal 1 (Week 2): STG=LTG 2/2 ELOSSTG=LTG 2/2 ELOS  Skilled Therapeutic Interventions/Progress Updates:    OT treatment session focused on B UE strengthening/coordination and standing balance/endurance. Pt propelled wc 30 ft with 2 seated rest breaks. Provided  pt with wc gloves for improved grip. Dynamic standing activity using thera-putty placed in small balls on window. Fine motor control to remove putty, dynamic balance with reaching outside base of support, and trunk extension with reaching overhead. Pt completed 3 mins on Sci-[employer] arm bike but reported too much pain in hand and had to stop. B UE strengthening with level 2 orange thera-band modified with foam grip. 3 sets of 10 biceps curl and triceps press. Pt left seated in wc with needs metr.   Therapy Documentation Precautions:  Precautions Precautions: Fall Restrictions Weight Bearing Restrictions: No Pain: Pain Assessment Pain Assessment: 0-10 Pain Score: 7  Pain Type: Neuropathic pain Pain Location: Generalized Pain Orientation: Right;Left Pain Descriptors / Indicators: Burning Pain Onset: On-going Pain Intervention(s): Repositioned;Heat applied ADL: ADL ADL Comments: Please see functional navigator  See Function Navigator for Current Functional Status.   Therapy/Group: Individual Therapy  Valma Cava 07/01/2017, 3:41 PM

## 2017-07-01 NOTE — Progress Notes (Signed)
EKG completed by NT Candace

## 2017-07-01 NOTE — Progress Notes (Signed)
Physical Therapy Session Note  Patient Details  Name: Catherine Butler D Helinski MRN: [id number] Date of Birth: 05/15/1961  Today's Date: 07/01/2017 PT Individual Time: 1105-1200 PT Individual Time Calculation (min): 55 min   Short Term Goals: Week 1:  PT Short Term Goal 1 (Week 1): Pt will transfer with min assist PT Short Term Goal 2 (Week 1): Pt will ambulate 930' with LRAD and mod assist PT Short Term Goal 3 (Week 1): Pt will initiate stair training with PT PT Short Term Goal 4 (Week 1): Pt will propel w/c 100' with supervision.   Skilled Therapeutic Interventions/Progress Updates: Pt received supine in bed, c/o BLE spasms and agreeable to treatment. Supine>sit with modI. BP assessed in sitting 127/86 HR 112bpm. Stand pivot transfer bed>w/c with minA. Stand pivot w/c <>BSC over toilet with no AD, min guard. MinA for balance while pt doffs/dons pants and underwear. Unable to void during attempt to toilet. W/c propulsion x100' with BUE for strengthening and endurance; S overall. Standing step ups forward/backward on 3" step in parallel bars with min guard, mod verbal cues for technique. Performed 2x5 reps backward steps BLE, 2x3 forward steps BLE. Stand pivot transfer w/c >mat table with no AD and min guard; min guard with RW to return to w/c. Standing balance on airex foam pad with one UE support on RW while RUE reached for and throwing horseshoes at target. Returned to room totalA in w/c. Stand pivot transfer w/c <>toilet with min guard as above for balance and donning/doffing pants. Stand pivot to return to bed min guard. Shoes doffed, non-skid socks donned totalA. Remained supine in bed at end of session, all needs in reach.      Therapy Documentation Precautions:  Precautions Precautions: Fall Restrictions Weight Bearing Restrictions: No   See Function Navigator for Current Functional Status.   Therapy/Group: Individual Therapy  Vista Lawmanlizabeth J Tygielski 07/01/2017, 11:58 AM

## 2017-07-02 ENCOUNTER — Inpatient Hospital Stay (HOSPITAL_COMMUNITY): Payer: Self-pay

## 2017-07-02 DIAGNOSIS — E876 Hypokalemia: Secondary | ICD-10-CM

## 2017-07-02 DIAGNOSIS — R000 Tachycardia, unspecified: Secondary | ICD-10-CM | POA: Insufficient documentation

## 2017-07-02 DIAGNOSIS — N390 Urinary tract infection, site not specified: Secondary | ICD-10-CM

## 2017-07-02 DIAGNOSIS — F411 Generalized anxiety disorder: Secondary | ICD-10-CM

## 2017-07-02 MED ORDER — POTASSIUM CHLORIDE CRYS ER 20 MEQ PO TBCR
20.0000 meq | EXTENDED_RELEASE_TABLET | Freq: Two times a day (BID) | ORAL | Status: AC
  Administered 2017-07-02 – 2017-07-03 (×4): 20 meq via ORAL
  Filled 2017-07-02 (×4): qty 1

## 2017-07-02 NOTE — Progress Notes (Signed)
Subjective/Complaints: Pt seen sitting at the edge of the bed this AM.  She states she slept well overnight.  She requests an inhaler.    ROS: Denies CP, SOB, N/V/D  Objective: Vital Signs: Blood pressure 90/60, pulse (!) 118, temperature 97.8 F (36.6 C), temperature source Oral, resp. rate 20, height _0  (1.6 m), weight 61.7 kg (136 lb 0.4 oz), last menstrual period 02/22/2006, SpO2 99 %. No results found. Results for orders placed or performed during the hospital encounter of 06/23/17 (from the past 72 hour(s))  CBC     Status: Abnormal   Collection Time: 06/30/17  2:18 AM  Result Value Ref Range   WBC 2.9 (L) 4.0 - 10.5 K/uL   RBC 3.55 (L) 3.87 - 5.11 MIL/uL   Hemoglobin 10.4 (L) 12.0 - 15.0 g/dL   HCT 31.8 (L) 36.0 - 46.0 %   MCV 89.6 78.0 - 100.0 fL   MCH 29.3 26.0 - 34.0 pg   MCHC 32.7 30.0 - 36.0 g/dL   RDW 13.4 11.5 - 15.5 %   Platelets 243 150 - 400 K/uL  Basic metabolic panel     Status: Abnormal   Collection Time: 07/01/17  7:01 AM  Result Value Ref Range   Sodium 136 135 - 145 mmol/L   Potassium 2.9 (L) 3.5 - 5.1 mmol/L   Chloride 102 101 - 111 mmol/L   CO2 20 (L) 22 - 32 mmol/L   Glucose, Bld 98 65 - 99 mg/dL   BUN 10 6 - 20 mg/dL   Creatinine, Ser 0.59 0.44 - 1.00 mg/dL   Calcium 8.5 (L) 8.9 - 10.3 mg/dL   GFR calc non Af Amer >60 >60 mL/min   GFR calc Af Amer >60 >60 mL/min    Comment: (NOTE) The eGFR has been calculated using the CKD EPI equation. This calculation has not been validated in all clinical situations. eGFR's persistently <60 mL/min signify possible Chronic Kidney Disease.    Anion gap 14 5 - 15   Gen: NAD. Vital signs reviewed.  Head:Normocephalicand atraumatic.  Mouth/Throat:Poor dentition Eyes:EOMI. No discharge.  Cardiovascular:Regular rhythm.+Tachycardiapresent.  Respiratory:Effort normaland breath sounds normal.  HQ:RFXJO sounds are normal. Nondistended Musculoskeletal: She exhibits noedema.  Tenderness to right  wrist. Neurological: She isalertand oriented Speech clear.  Anxious.  Hyperesthesia right hand Motor: 4-/5 bilateral deltoid bicep tricep finger flexors and extensors (pain inhibition) 3/5 bilateral hip flexor knee extensor ankle dorsiflexor plantar flexor Skin: Skin iswarmand dry. She isnot diaphoretic.  Psychiatric: [name] is normal. Her mood appearsanxious. She expressesinappropriate judgment. She isinattentive.  Assessment/Plan: 1. Functional deficits secondary to quadriparesis which require 3+ hours per day of interdisciplinary therapy in a comprehensive inpatient rehab setting. Physiatrist is providing close team supervision and 24 hour management of active medical problems listed below. Physiatrist and rehab team continue to assess barriers to discharge/monitor patient progress toward functional and medical goals. FIM: Function - Bathing Bathing activity did not occur: Refused Position: Shower Body parts bathed by patient: Right arm, Left arm, Chest, Abdomen, Front perineal area, Buttocks, Right upper leg, Left upper leg, Left lower leg, Right lower leg Body parts bathed by helper: Right lower leg, Left lower leg, Back Assist Level: Supervision or verbal cues  Function- Upper Body Dressing/Undressing What is the patient wearing?: Pull over shirt/dress Pull over shirt/dress - Perfomed by patient: Thread/unthread right sleeve, Thread/unthread left sleeve, Put head through opening, Pull shirt over trunk Pull over shirt/dress - Perfomed by helper: Put head through opening, Pull shirt over trunk  Assist Level: More than reasonable time Set up : To obtain clothing/put away Function - Lower Body Dressing/Undressing What is the patient wearing?: Pants, Non-skid slipper socks Position: Sitting EOB Pants- Performed by patient: Thread/unthread right pants leg, Pull pants up/down, Thread/unthread left pants leg Pants- Performed by helper: Thread/unthread right pants leg,  Thread/unthread left pants leg, Pull pants up/down Non-skid slipper socks- Performed by patient: Don/doff right sock, Don/doff left sock Non-skid slipper socks- Performed by helper: Don/doff right sock, Don/doff left sock Assist for footwear: Supervision/touching assist Assist for lower body dressing: Touching or steadying assistance (Pt > 75%)  Function - Toileting Toileting activity did not occur: No continent bowel/bladder event Toileting steps completed by patient: Adjust clothing prior to toileting, Performs perineal hygiene, Adjust clothing after toileting Toileting steps completed by helper: Adjust clothing prior to toileting, Adjust clothing after toileting Toileting Assistive Devices: Grab bar or rail Assist level: Touching or steadying assistance (Pt.75%)  Function - Air cabin crew transfer activity did not occur: Refused Toilet transfer assistive device: Elevated toilet seat/BSC over toilet, Grab bar Assist level to toilet: Touching or steadying assistance (Pt > 75%) Assist level from toilet: Touching or steadying assistance (Pt > 75%)  Function - Chair/bed transfer Chair/bed transfer method: Stand pivot Chair/bed transfer assist level: Touching or steadying assistance (Pt > 75%) Chair/bed transfer assistive device: Armrests, Walker Chair/bed transfer details: Verbal cues for safe use of DME/AE, Verbal cues for precautions/safety  Function - Locomotion: Wheelchair Will patient use wheelchair at discharge?: Yes Type: Manual Max wheelchair distance: 100 Assist Level: Supervision or verbal cues Wheel 50 feet with 2 turns activity did not occur: Refused Assist Level: Supervision or verbal cues Wheel 150 feet activity did not occur: Refused Assist Level: Supervision or verbal cues Turns around,maneuvers to table,bed, and toilet,negotiates 3% grade,maneuvers on rugs and over doorsills: No Function - Locomotion: Ambulation Assistive device: Walker-rolling Max  distance: 20' Assist level: Touching or steadying assistance (Pt > 75%) Assist level: Touching or steadying assistance (Pt > 75%) Walk 50 feet with 2 turns activity did not occur: Safety/medical concerns Assist level: Touching or steadying assistance (Pt > 75%) Walk 150 feet activity did not occur: Safety/medical concerns Walk 10 feet on uneven surfaces activity did not occur: Safety/medical concerns  Function - Comprehension Comprehension: Auditory Comprehension assist level: Follows complex conversation/direction with extra time/assistive device  Function - Expression Expression: Verbal Expression assist level: Expresses complex ideas: With extra time/assistive device  Function - Social Interaction Social Interaction assist level: Interacts appropriately with others with medication or extra time (anti-anxiety, antidepressant).  Function - Problem Solving Problem solving assist level: Solves complex problems: With extra time  Function - Memory Memory assist level: More than reasonable amount of time Patient normally able to recall (first 3 days only): Current season, Location of own room, That he or she is in a hospital, Staff names and faces   Medical Problem List and Plan: 1.   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 DVT Prophylaxis/Anticoagulation: Pharmaceutical:Lovenox 3. Pain Management:switched Lyrica to gabapentin for insurance reasons.   Using prn hydrocodone around the clock, changed to tramadol for D/C   4. Mood:LCSW to provide ego support. LCSW to follow for evaluation and support. 5. Neuropsych: This patientiscapable of making decisions on herown behalf. 6. Skin/Wound Care:routine pressure relief measurs 7. Fluids/Electrolytes/Nutrition:Recheck lytes weekly 8. ABLA: Recheck CBC weekly.   Hb 10.4 on 1/10 9. H/o Constipation: CT with evidence of constipation. Scheduled  miralax daily.  10. Urinary  retention and UTI.    >100K ecoli S to Keflex 11. Anxiety disorder: Continue Remeron and Seroquel at bedtime.   Sees Dr. Sabra Heck at behavioral health recommend neuropsych eval 12. Neuropathy: continue Gabapentin 600 mg qid.no sig limitation on fxn 13.  History of alcohol abuse has CT abdomen evidence of fatty liver plus chronic pancreatitis.  Patient states that she last drank heavily a couple years ago, with lighter alcohol use last around 3 or 4 months ago 14.  Tobacco abuse will start NicoDerm patchon 1/4 15.  Sinus tachycardia,  asymptomatic no tx for now , BP on low side, may be related to pain and anxiety - no BB due to BP.  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  Constipation improved on Senna S BID   Improving 17. Hypokalemia  K 2.9 on 1/11   Supplementing x2 days  Labs ordered for Monday  LOS (Days) 9 A FACE TO FACE EVALUATION WAS PERFORMED  Catherine Butler Lorie Phenix 07/02/2017, 11:39 AM

## 2017-07-02 NOTE — Progress Notes (Signed)
Occupational Therapy Session Note  Patient Details  Name: Catherine Butler MRN: [id number] Date of Birth: [birth year]  Today's Date: 07/02/2017 OT Individual Time: 1638-4536 OT Individual Time Calculation (min): 57 min     Skilled Therapeutic Interventions/Progress Updates:    1;1. Throughout session pt BP remains low despite teds. Pt propels w/c to and from tx destinations with increased time and supervision. Pt dons shoes with increased time and Vc for pulling up tongue prior to donning. Pt transfers throughout session MIN A with RW w/c<>EOM/BSC/EOB with Vc for RW management and reach back. Pt uses 2# dowel rod to bounce back beach ball for BUE strength and coordination. Pt completes 3x15 shuttle activity for chest expansion, scapular retraction, grip, horizontal/ab adduciton and improve scapular strengthening/kyphiotic posture.  Pt toilets with touching A and no VC. Exited session with pt seated in bed call light in reahc and all needs met.  Therapy Documentation Precautions:  Precautions Precautions: Fall Restrictions Weight Bearing Restrictions: No  See Function Navigator for Current Functional Status.   Therapy/Group: Individual Therapy  Tonny Branch 07/02/2017, 2:25 PM

## 2017-07-03 ENCOUNTER — Inpatient Hospital Stay (HOSPITAL_COMMUNITY): Payer: Self-pay

## 2017-07-03 MED ORDER — METHOCARBAMOL 750 MG PO TABS: 750.0000 mg | ORAL_TABLET | Freq: Four times a day (QID) | ORAL | Status: DC | PRN

## 2017-07-03 MED ORDER — METHOCARBAMOL 500 MG PO TABS
1000.0000 mg | ORAL_TABLET | Freq: Four times a day (QID) | ORAL | Status: DC | PRN
  Administered 2017-07-03 – 2017-07-05 (×6): 1000 mg via ORAL
  Filled 2017-07-03 (×6): qty 2

## 2017-07-03 MED ORDER — CEPHALEXIN 250 MG PO CAPS
250.0000 mg | ORAL_CAPSULE | Freq: Three times a day (TID) | ORAL | Status: AC
  Administered 2017-07-03 (×2): 250 mg via ORAL
  Filled 2017-07-03 (×2): qty 1

## 2017-07-03 NOTE — Plan of Care (Signed)
  Progressing RH BOWEL ELIMINATION RH STG MANAGE BOWEL WITH ASSISTANCE Description STG Manage Bowel with mod Assistance.  07/02/2017 2359 - Progressing by Hosie Poissonruzat, Onaje Warne, RN RH STG MANAGE BOWEL W/MEDICATION W/ASSISTANCE Description STG Manage Bowel with Medication with mod Assistance.  07/02/2017 2359 - Progressing by Hosie Poissonruzat, Terry Abila, RN RH SKIN INTEGRITY RH STG SKIN FREE OF INFECTION/BREAKDOWN Description Free from skin breakdown entire stay in rehab  07/02/2017 2359 - Progressing by Hosie Poissonruzat, Kahliya Fraleigh, RN RH STG MAINTAIN SKIN INTEGRITY WITH ASSISTANCE Description STG Maintain Skin Integrity With Mod Assistance.  07/02/2017 2359 - Progressing by Hosie Poissonruzat, Kamrin Sibley, RN RH SAFETY RH STG ADHERE TO SAFETY PRECAUTIONS W/ASSISTANCE/DEVICE Description STG Adhere to Safety Precautions With mod Assistance/Device.  07/02/2017 2359 - Progressing by Hosie Poissonruzat, Valori Hollenkamp, RN RH PAIN MANAGEMENT RH STG PAIN MANAGED AT OR BELOW PT'S PAIN GOAL Description Less than 2  07/02/2017 2359 - Progressing by Hosie Poissonruzat, Orva Gwaltney, RN RH KNOWLEDGE DEFICIT GENERAL RH STG INCREASE KNOWLEDGE OF SELF CARE AFTER HOSPITALIZATION Description Increase awareness and involvement of self care with mod assist  07/02/2017 2359 - Progressing by Hosie Poissonruzat, Demar Shad, [facility] PATIENT EDUCATION Description  See Patient Education module for education specifics.  07/02/2017 2359 - Progressing by Hosie Poissonruzat, Ryane Canavan, RN

## 2017-07-03 NOTE — Progress Notes (Signed)
Subjective/Complaints: Patient seen lying in bed this morning. She states she did not sleep well overnight due to pain. She requests increasing pain meds and says that she is having spasms.  ROS: + Spasms. Denies CP, SOB, N/V/D  Objective: Vital Signs: Blood pressure 107/78, pulse (!) 115, temperature 97.7 F (36.5 C), temperature source Oral, resp. rate 18, height '5\' 3"'$  (1.6 m), weight 61.7 kg (136 lb 0.4 oz), last menstrual period 02/22/2006, SpO2 98 %. No results found. Results for orders placed or performed during the hospital encounter of 06/23/17 (from the past 72 hour(s))  Basic metabolic panel     Status: Abnormal   Collection Time: 07/01/17  7:01 AM  Result Value Ref Range   Sodium 136 135 - 145 mmol/L   Potassium 2.9 (L) 3.5 - 5.1 mmol/L   Chloride 102 101 - 111 mmol/L   CO2 20 (L) 22 - 32 mmol/L   Glucose, Bld 98 65 - 99 mg/dL   BUN 10 6 - 20 mg/dL   Creatinine, Ser 0.59 0.44 - 1.00 mg/dL   Calcium 8.5 (L) 8.9 - 10.3 mg/dL   GFR calc non Af Amer >60 >60 mL/min   GFR calc Af Amer >60 >60 mL/min    Comment: (NOTE) The eGFR has been calculated using the CKD EPI equation. This calculation has not been validated in all clinical situations. eGFR's persistently <60 mL/min signify possible Chronic Kidney Disease.    Anion gap 14 5 - 15   Gen: NAD. Vital signs reviewed.  Head:Normocephalicand atraumatic.  Mouth/Throat:Poor dentition Eyes:EOMI. No discharge.  Cardiovascular:Regular rhythm.+Tachycardiapresent.  Respiratory:Effort normaland breath sounds normal.  UX:LKGMW sounds are normal. Nondistended Musculoskeletal: She exhibits noedema.  Tenderness to right wrist, stable. Neurological: She isalertand oriented Speech clear.  Anxious.  Hyperesthesia right hand Motor: 4-/5 bilateral deltoid bicep tricep finger flexors and extensors (pain inhibition, unchanged) 3/5 bilateral hip flexor knee extensor ankle dorsiflexor plantar flexor Skin: Skin iswarmand  dry. She isnot diaphoretic.  Psychiatric: [name] is normal. Her mood appearsanxious. She expressesinappropriate judgment. She isinattentive.  Assessment/Plan: 1. Functional deficits secondary to quadriparesis which require 3+ hours per day of interdisciplinary therapy in a comprehensive inpatient rehab setting. Physiatrist is providing close team supervision and 24 hour management of active medical problems listed below. Physiatrist and rehab team continue to assess barriers to discharge/monitor patient progress toward functional and medical goals. FIM: Function - Bathing Bathing activity did not occur: Refused Position: Shower Body parts bathed by patient: Right arm, Left arm, Chest, Abdomen, Front perineal area, Buttocks, Right upper leg, Left upper leg, Left lower leg, Right lower leg Body parts bathed by helper: Right lower leg, Left lower leg, Back Assist Level: Supervision or verbal cues  Function- Upper Body Dressing/Undressing What is the patient wearing?: Pull over shirt/dress Pull over shirt/dress - Perfomed by patient: Thread/unthread right sleeve, Thread/unthread left sleeve, Put head through opening, Pull shirt over trunk Pull over shirt/dress - Perfomed by helper: Put head through opening, Pull shirt over trunk Assist Level: More than reasonable time Set up : To obtain clothing/put away Function - Lower Body Dressing/Undressing What is the patient wearing?: Pants, Non-skid slipper socks Position: Sitting EOB Pants- Performed by patient: Thread/unthread right pants leg, Pull pants up/down, Thread/unthread left pants leg Pants- Performed by helper: Thread/unthread right pants leg, Thread/unthread left pants leg, Pull pants up/down Non-skid slipper socks- Performed by patient: Don/doff right sock, Don/doff left sock Non-skid slipper socks- Performed by helper: Don/doff right sock, Don/doff left sock Assist for footwear: Supervision/touching  assist Assist for lower  body dressing: Touching or steadying assistance (Pt > 75%)  Function - Toileting Toileting activity did not occur: No continent bowel/bladder event Toileting steps completed by patient: Adjust clothing prior to toileting, Performs perineal hygiene, Adjust clothing after toileting Toileting steps completed by helper: Adjust clothing prior to toileting, Adjust clothing after toileting Toileting Assistive Devices: Grab bar or rail Assist level: Touching or steadying assistance (Pt.75%)  Function - Air cabin crew transfer activity did not occur: Refused Toilet transfer assistive device: Elevated toilet seat/BSC over toilet, Grab bar Assist level to toilet: Touching or steadying assistance (Pt > 75%) Assist level from toilet: Touching or steadying assistance (Pt > 75%)  Function - Chair/bed transfer Chair/bed transfer method: Stand pivot Chair/bed transfer assist level: Touching or steadying assistance (Pt > 75%) Chair/bed transfer assistive device: Armrests, Walker Chair/bed transfer details: Verbal cues for safe use of DME/AE, Verbal cues for precautions/safety  Function - Locomotion: Wheelchair Will patient use wheelchair at discharge?: Yes Type: Manual Max wheelchair distance: 100 Assist Level: Supervision or verbal cues Wheel 50 feet with 2 turns activity did not occur: Refused Assist Level: Supervision or verbal cues Wheel 150 feet activity did not occur: Refused Assist Level: Supervision or verbal cues Turns around,maneuvers to table,bed, and toilet,negotiates 3% grade,maneuvers on rugs and over doorsills: No Function - Locomotion: Ambulation Assistive device: Walker-rolling Max distance: 81' Assist level: Touching or steadying assistance (Pt > 75%) Assist level: Touching or steadying assistance (Pt > 75%) Walk 50 feet with 2 turns activity did not occur: Safety/medical concerns Assist level: Touching or steadying assistance (Pt > 75%) Walk 150 feet activity did not  occur: Safety/medical concerns Walk 10 feet on uneven surfaces activity did not occur: Safety/medical concerns  Function - Comprehension Comprehension: Auditory Comprehension assist level: Follows complex conversation/direction with extra time/assistive device  Function - Expression Expression: Verbal Expression assist level: Expresses complex ideas: With extra time/assistive device  Function - Social Interaction Social Interaction assist level: Interacts appropriately with others with medication or extra time (anti-anxiety, antidepressant).  Function - Problem Solving Problem solving assist level: Solves complex problems: With extra time  Function - Memory Memory assist level: More than reasonable amount of time Patient normally able to recall (first 3 days only): Current season, Location of own room, That he or she is in a hospital, Staff names and faces   Medical Problem List and Plan: 1.   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 DVT Prophylaxis/Anticoagulation: Pharmaceutical:Lovenox 3. Pain Management:switched Lyrica to gabapentin for insurance reasons.   Using prn hydrocodone around the clock, changed to tramadol for D/C     Methocarbamol increased to 1000 every 6 when necessary on 1/13 4. Mood:LCSW to provide ego support. LCSW to follow for evaluation and support. 5. Neuropsych: This patientiscapable of making decisions on herown behalf. 6. Skin/Wound Care:routine pressure relief measurs 7. Fluids/Electrolytes/Nutrition:Recheck lytes weekly 8. ABLA: Recheck CBC weekly.   Hb 10.4 on 1/10 9. H/o Constipation: CT with evidence of constipation. Scheduled miralax daily.  10. Urinary retention and UTI.    >100K ecoli S to Keflex, to be completed today 11. Anxiety disorder: Continue Remeron and Seroquel at bedtime.   Sees Dr. Sabra Heck at behavioral health recommend neuropsych eval 12. Neuropathy: continue  Gabapentin 600 mg qid.no sig limitation on fxn 13.  History of alcohol abuse has CT abdomen evidence of fatty liver plus chronic pancreatitis.  Patient states that she last drank heavily  a couple years ago, with lighter alcohol use last around 3 or 4 months ago 14.  Tobacco abuse will start NicoDerm patchon 1/4 15.  Sinus tachycardia,  asymptomatic no tx for now , BP on low side, may be related to pain and anxiety - no BB due to BP.  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  Constipation improved on Senna S BID   Improving 17. Hypokalemia  K 2.9 on 1/11   Supplementing x2 days  Labs ordered for tomorrow  LOS (Days) 10 A FACE TO FACE EVALUATION WAS PERFORMED  Catherine Butler Lorie Phenix 07/03/2017, 8:05 AM

## 2017-07-03 NOTE — Progress Notes (Signed)
Occupational Therapy Session Note  Patient Details  Name: Catherine Butler MRN: [id number] Date of Birth: 11/11/1960  Today's Date: 07/03/2017 OT Individual Time: 1305-1400 OT Individual Time Calculation (min): 55 min    Skilled Therapeutic Interventions/Progress Updates:    1:1. Pt just hooked up to breathing treatment upon entering room, pt missed 5 min skilled OT. Pt declines bathing/dressing this session saying, "i'll do it tomorrow." OT selects proper fitting teds. Pt dons shoes after OT applies teds. Pt propels w/c to/from all tx destinations with supervision and increased time. Pt stand pivot transfer to EOM/bed/BSC from w/c with CGA throughout session. Pt reporting wanting to work on handwriting. Pt does not assume tripod grasp for writing and says "that's always how I hold it" when OT comments on resting pen on middle phalanx of the third digit instead of distal phalanx for dynamic tripod grasp. Applied coban grip to pen to improve grip strength and control of pen. Pt demo improved legibility with extra grip circumference. OT provides pt with exercises for handwriting and FMC with pen. Pt toilets wit min A for balance when managing clothing. Exited session wit pt seated in bed, call light in reach and all needs met,.  Therapy Documentation Precautions:  Precautions Precautions: Fall Restrictions Weight Bearing Restrictions: No  See Function Navigator for Current Functional Status.   Therapy/Group: Individual Therapy   M  07/03/2017, 1:04 PM 

## 2017-07-03 NOTE — Progress Notes (Signed)
Physical Therapy Note  Patient Details  Name: Catherine Butler D Yebra MRN: [id number] Date of Birth: 06/01/1961 Today's Date: 07/03/2017  [id number], 30 min individual tx Pain:  None per pt  Pt reported that she was dizzy and her BP was low earlier today.  She was willing to do bedside tx.  Neuro re-ed via multimodal cues, focusing on eccentric control., breath control when exerting herself, maximizing muscle recruitment during movements.  Supine R/L straight leg raises, bil hip internal rotation, modified abdominal crunches, alternating R/L ankle PF/DF,  R/L clam shells for hip abduction in L/R side lying, bil bridging, and Bil lower trunk rotation given manual resistance for R/L hip abduction.   Pt set up for dinner propped in bed.  Pt had jello, whipped potatoes and gravy and hot cocoa for dinner.  PT educated pt on need for more protein to build muscle as she recovers.  Pt was willing to try peanut butter.  PT provided peanut butter and saltines; pt used bil hands to spread peanut butter, dropping crackers several times due to apparent anxiety. Son arrived.  Pt left resting in bed with alarm set and all needs within reach.  See function navigator for current status.  [name] 07/03/2017, 12:41 PM

## 2017-07-03 NOTE — Plan of Care (Signed)
  Progressing RH BOWEL ELIMINATION RH STG MANAGE BOWEL WITH ASSISTANCE Description STG Manage Bowel with mod Assistance.  07/03/2017 2221 - Progressing by Hosie Poissonruzat, Cristofher Livecchi, RN RH STG MANAGE BOWEL W/MEDICATION W/ASSISTANCE Description STG Manage Bowel with Medication with mod Assistance.  07/03/2017 2221 - Progressing by Hosie Poissonruzat, Sophiya Morello, RN RH BLADDER ELIMINATION RH STG MANAGE BLADDER WITH ASSISTANCE Description STG Manage Bladder With mod Assistance  07/03/2017 2221 - Progressing by Hosie Poissonruzat, Becky Berberian, RN RH SKIN INTEGRITY RH STG SKIN FREE OF INFECTION/BREAKDOWN Description Free from skin breakdown entire stay in rehab  07/03/2017 2221 - Progressing by Hosie Poissonruzat, Atari Novick, RN RH STG MAINTAIN SKIN INTEGRITY WITH ASSISTANCE Description STG Maintain Skin Integrity With Mod Assistance.  07/03/2017 2221 - Progressing by Hosie Poissonruzat, Gilad Dugger, RN RH SAFETY RH STG ADHERE TO SAFETY PRECAUTIONS W/ASSISTANCE/DEVICE Description STG Adhere to Safety Precautions With mod Assistance/Device.  07/03/2017 2221 - Progressing by Hosie Poissonruzat, Roth Ress, RN RH KNOWLEDGE DEFICIT GENERAL RH STG INCREASE KNOWLEDGE OF SELF CARE AFTER HOSPITALIZATION Description Increase awareness and involvement of self care with mod assist  07/03/2017 2221 - Progressing by Hosie Poissonruzat, Jaquavian Firkus, RN

## 2017-07-04 ENCOUNTER — Other Ambulatory Visit: Payer: Self-pay

## 2017-07-04 ENCOUNTER — Inpatient Hospital Stay (HOSPITAL_COMMUNITY): Payer: Self-pay | Admitting: Physical Therapy

## 2017-07-04 ENCOUNTER — Inpatient Hospital Stay (HOSPITAL_COMMUNITY): Payer: Self-pay | Admitting: Occupational Therapy

## 2017-07-04 LAB — BASIC METABOLIC PANEL
Anion gap: 11 (ref 5–15)
BUN: 14 mg/dL (ref 6–20)
CO2: 22 mmol/L (ref 22–32)
Calcium: 8.6 mg/dL — ABNORMAL LOW (ref 8.9–10.3)
Chloride: 99 mmol/L — ABNORMAL LOW (ref 101–111)
Creatinine, Ser: 0.55 mg/dL (ref 0.44–1.00)
GLUCOSE: 122 mg/dL — AB (ref 65–99)
Potassium: 3.8 mmol/L (ref 3.5–5.1)
Sodium: 132 mmol/L — ABNORMAL LOW (ref 135–145)

## 2017-07-04 LAB — CBC WITH DIFFERENTIAL/PLATELET
BASOS ABS: 0 10*3/uL (ref 0.0–0.1)
Basophils Relative: 1 %
Eosinophils Absolute: 0.1 10*3/uL (ref 0.0–0.7)
Eosinophils Relative: 1 %
HEMATOCRIT: 35.1 % — AB (ref 36.0–46.0)
Hemoglobin: 11.8 g/dL — ABNORMAL LOW (ref 12.0–15.0)
LYMPHS PCT: 45 %
Lymphs Abs: 1.8 10*3/uL (ref 0.7–4.0)
MCH: 30.7 pg (ref 26.0–34.0)
MCHC: 33.6 g/dL (ref 30.0–36.0)
MCV: 91.4 fL (ref 78.0–100.0)
Monocytes Absolute: 0.6 10*3/uL (ref 0.1–1.0)
Monocytes Relative: 15 %
NEUTROS ABS: 1.5 10*3/uL — AB (ref 1.7–7.7)
Neutrophils Relative %: 38 %
Platelets: 281 10*3/uL (ref 150–400)
RBC: 3.84 MIL/uL — AB (ref 3.87–5.11)
RDW: 14.2 % (ref 11.5–15.5)
WBC: 4.1 10*3/uL (ref 4.0–10.5)

## 2017-07-04 MED ORDER — HYDROCODONE-ACETAMINOPHEN 5-325 MG PO TABS
1.0000 | ORAL_TABLET | Freq: Three times a day (TID) | ORAL | Status: DC | PRN
  Administered 2017-07-05: 1 via ORAL
  Filled 2017-07-04: qty 1

## 2017-07-04 MED ORDER — ALBUTEROL SULFATE HFA 108 (90 BASE) MCG/ACT IN AERS
1.0000 | INHALATION_SPRAY | Freq: Four times a day (QID) | RESPIRATORY_TRACT | Status: DC | PRN
  Filled 2017-07-04: qty 6.7

## 2017-07-04 NOTE — Progress Notes (Signed)
Physical Therapy Weekly Progress Note  Patient Details  Name: Catherine Butler MRN: [id number] Date of Birth: [birth year]  Beginning of progress report period: June 25, 2017 End of progress report period: July 04, 2016  Today's Date: 07/04/2017 PT Individual Time: 1000-1100 PT Individual Time Calculation (min): 60 min   Patient has met 4 of 4 short term goals.  Pt has made good progress with PT and is currently performing all mobility with close supervision <> min assist.  Pt continues to require encouragement to push herself and often c/o not being able to try things 2/2 pain, low BP, or another reason.    Patient continues to demonstrate the following deficits muscle weakness, abnormal tone, unbalanced muscle activation, ataxia and decreased coordination and decreased standing balance, decreased postural control and decreased balance strategies and therefore will continue to benefit from skilled PT intervention to increase functional independence with mobility.  Patient progressing toward long term goals..  Continue plan of care.  PT Short Term Goals Week 1:  PT Short Term Goal 1 (Week 1): Pt will transfer with min assist PT Short Term Goal 1 - Progress (Week 1): Met PT Short Term Goal 2 (Week 1): Pt will ambulate 10' with LRAD and mod assist PT Short Term Goal 2 - Progress (Week 1): Met PT Short Term Goal 3 (Week 1): Pt will initiate stair training with PT PT Short Term Goal 3 - Progress (Week 1): Met PT Short Term Goal 4 (Week 1): Pt will propel w/c 100' with supervision.  PT Short Term Goal 4 - Progress (Week 1): Met Week 2:  PT Short Term Goal 1 (Week 2): =LTGs due to ELOS  Skilled Therapeutic Interventions/Progress Updates:    c/o pain as usual in hands, RN in to medicate.  Session focus on coordination and activity tolerance.  Pt continues to require rest breaks between all activities.   Pt propels w/c to therapy gym with increased time for UE strengthening, endurance, and  mobility.  Standing toe taps to cones with 2.5# ankle weight for improved coordination, 3x8 reps bilaterally.  Standing toe taps without weights, x8 reps with pt focusing on slow/controlled movements.    Stair negotiation x8 steps (3") with min assist, +4 steps (6") with mod>min assist.  Pt requires verbal cues for sequencing and repetition for leading with strong LE.    W/c propulsion to return to room for activity tolerance, pt refused to remain in chair at end of session, supervision for stand/pivot back to bed.   Therapy Documentation Precautions:  Precautions Precautions: Fall Restrictions Weight Bearing Restrictions: No  See Function Navigator for Current Functional Status.  Therapy/Group: Individual Therapy  [name] 07/04/2017, 10:31 AM

## 2017-07-04 NOTE — Progress Notes (Signed)
Physical Therapy Session Note  Patient Details  Name: Catherine Butler MRN: [id number] Date of Birth: [birth year]  Today's Date: 07/04/2017 PT Individual Time: 1550-1613 PT Individual Time Calculation (min): 23 min   Short Term Goals: Week 2:  PT Short Term Goal 1 (Week 2): =LTGs due to ELOS  Skilled Therapeutic Interventions/Progress Updates:    no c/o pain.  Session focus on activity tolerance and safety awareness with transfers.    Pt requesting to toilet.  Bed mobility mod I, pt dons shoes with supervision and increased time.  Supervision for ambulation to bathroom with RW, pt completes 3/3 toileting steps for (-) void.  Nustep 2x4 minutes at level 3 focus on maintaining steps/minutes >40.  Rest break in between.  Pt again refused to stay in w/c at end of session, returned to bed at end of session with call bell in reach and needs met.   Therapy Documentation Precautions:  Precautions Precautions: Fall Restrictions Weight Bearing Restrictions: No   See Function Navigator for Current Functional Status.   Therapy/Group: Individual Therapy  [name] 07/04/2017, 4:09 PM

## 2017-07-04 NOTE — Progress Notes (Signed)
Social Work Patient ID: Catherine Butler, female   DOB: [birth year], 57 y.o.   MRN: [id number]  Met with pt to answer her questions regarding the paperwork she has received from [facility]. Have assisted pt in completing it and she will mail in when she gets home due to bills are there. Discussed discharge needs tub bench and home health therapies. She is agreeable and feels this will help at home. Asked if Mom is coming in prior to discharge Wed. Pt is unsure, she would benefit from her seeing pt in therapies. Will try to contact her myself and see if she can come in. Work on discharge for [employer].

## 2017-07-04 NOTE — Progress Notes (Signed)
Subjective/Complaints:  Asking for more pain meds Robaxin increased yesterday, has not been using tramadol as much as availaible, c/o hand an dfoot pain.  We discussed this is neuropathic pain and is on gabapentin for this , prn tramadol for moderate pain and prn hydrocodone for severe Would like bedside inhaler- uses albuterol at home  ROS: + Spasms. Denies CP, SOB, N/V/D  Objective: Vital Signs: Blood pressure 111/80, pulse (!) 119, temperature 97.8 F (36.6 C), temperature source Oral, resp. rate 18, height 5\' 3"  (1.6 m), weight 62.4 kg (137 lb 9.1 oz), last menstrual period 02/22/2006, SpO2 100 %. No results found. No results found for this or any previous visit (from the past 72 hour(s)). Gen: NAD. Vital signs reviewed.  Head:Normocephalicand atraumatic.  Mouth/Throat:Poor dentition Eyes:EOMI. No discharge.  Cardiovascular:Regular rhythm.+Tachycardiapresent.  Respiratory:Effort normaland breath sounds normal.  WJ:XBJYN sounds are normal. Nondistended Musculoskeletal: She exhibits noedema.  Tenderness to right wrist, stable. Neurological: She isalertand oriented Speech clear.  Anxious.  Hyperesthesia right hand Motor: 4-/5 bilateral deltoid bicep tricep finger flexors and extensors (pain inhibition, unchanged) 3/5 bilateral hip flexor knee extensor ankle dorsiflexor plantar flexor Skin: Skin iswarmand dry. She isnot diaphoretic.  Psychiatric: [name] is normal. Her mood appearsanxious. She expressesinappropriate judgment. She isinattentive.  Assessment/Plan: 1. Functional deficits secondary to quadriparesis which require 3+ hours per day of interdisciplinary therapy in a comprehensive inpatient rehab setting. Physiatrist is providing close team supervision and 24 hour management of active medical problems listed below. Physiatrist and rehab team continue to assess barriers to discharge/monitor patient progress toward functional and medical  goals. FIM: Function - Bathing Bathing activity did not occur: Refused Position: Shower Body parts bathed by patient: Right arm, Left arm, Chest, Abdomen, Front perineal area, Buttocks, Right upper leg, Left upper leg, Left lower leg, Right lower leg Body parts bathed by helper: Right lower leg, Left lower leg, Back Assist Level: Supervision or verbal cues  Function- Upper Body Dressing/Undressing What is the patient wearing?: Pull over shirt/dress Pull over shirt/dress - Perfomed by patient: Thread/unthread right sleeve, Thread/unthread left sleeve, Put head through opening, Pull shirt over trunk Pull over shirt/dress - Perfomed by helper: Put head through opening, Pull shirt over trunk Assist Level: More than reasonable time Set up : To obtain clothing/put away Function - Lower Body Dressing/Undressing What is the patient wearing?: Pants, Non-skid slipper socks Position: Sitting EOB Pants- Performed by patient: Thread/unthread right pants leg, Pull pants up/down, Thread/unthread left pants leg Pants- Performed by helper: Thread/unthread right pants leg, Thread/unthread left pants leg, Pull pants up/down Non-skid slipper socks- Performed by patient: Don/doff right sock, Don/doff left sock Non-skid slipper socks- Performed by helper: Don/doff right sock, Don/doff left sock Assist for footwear: Supervision/touching assist Assist for lower body dressing: Touching or steadying assistance (Pt > 75%)  Function - Toileting Toileting activity did not occur: No continent bowel/bladder event Toileting steps completed by patient: Adjust clothing prior to toileting, Performs perineal hygiene, Adjust clothing after toileting Toileting steps completed by helper: Adjust clothing prior to toileting, Adjust clothing after toileting Toileting Assistive Devices: Grab bar or rail Assist level: Touching or steadying assistance (Pt.75%)  Function - Archivist transfer activity did not occur:  Refused Toilet transfer assistive device: Grab bar Assist level to toilet: Touching or steadying assistance (Pt > 75%) Assist level from toilet: Touching or steadying assistance (Pt > 75%)  Function - Chair/bed transfer Chair/bed transfer method: Stand pivot Chair/bed transfer assist level: Touching or steadying assistance (Pt > 75%) Chair/bed  transfer assistive device: Armrests, Walker Chair/bed transfer details: Verbal cues for safe use of DME/AE, Verbal cues for precautions/safety  Function - Locomotion: Wheelchair Will patient use wheelchair at discharge?: Yes Type: Manual Max wheelchair distance: 100 Assist Level: Supervision or verbal cues Wheel 50 feet with 2 turns activity did not occur: Refused Assist Level: Supervision or verbal cues Wheel 150 feet activity did not occur: Refused Assist Level: Supervision or verbal cues Turns around,maneuvers to table,bed, and toilet,negotiates 3% grade,maneuvers on rugs and over doorsills: No Function - Locomotion: Ambulation Assistive device: Walker-rolling Max distance: 62' Assist level: Touching or steadying assistance (Pt > 75%) Assist level: Touching or steadying assistance (Pt > 75%) Walk 50 feet with 2 turns activity did not occur: Safety/medical concerns Assist level: Touching or steadying assistance (Pt > 75%) Walk 150 feet activity did not occur: Safety/medical concerns Walk 10 feet on uneven surfaces activity did not occur: Safety/medical concerns  Function - Comprehension Comprehension: Auditory Comprehension assist level: Follows complex conversation/direction with extra time/assistive device  Function - Expression Expression: Verbal Expression assist level: Expresses complex ideas: With extra time/assistive device  Function - Social Interaction Social Interaction assist level: Interacts appropriately with others with medication or extra time (anti-anxiety, antidepressant).  Function - Problem Solving Problem solving  assist level: Solves complex problems: With extra time  Function - Memory Memory assist level: More than reasonable amount of time Patient normally able to recall (first 3 days only): Current season, Location of own room, That he or she is in a hospital, Staff names and faces   Medical Problem List and Plan: 1.   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 DVT Prophylaxis/Anticoagulation: Pharmaceutical:Lovenox 3. Pain Management:switched Lyrica to gabapentin for insurance reasons.   Using prn hydrocodone around the clock, changed to tramadol for D/C     Methocarbamol increased to 1000 every 6 when necessary on 1/13 4. Mood:LCSW to provide ego support. LCSW to follow for evaluation and support. 5. Neuropsych: This patientiscapable of making decisions on herown behalf. 6. Skin/Wound Care:routine pressure relief measurs 7. Fluids/Electrolytes/Nutrition:Recheck lytes weekly 8. ABLA: Recheck CBC weekly.   Hb 10.4 on 1/10 9. H/o Constipation: CT with evidence of constipation. Scheduled miralax daily.  10. Urinary retention and UTI.    resolved 11. Anxiety disorder: Continue Remeron and Seroquel at bedtime.   Sees Dr. Dub Mikes at behavioral health recommend neuropsych eval 12. Neuropathy: continue Gabapentin 600 mg qid.no sig limitation on fxn 13.  History of alcohol abuse has CT abdomen evidence of fatty liver plus chronic pancreatitis.  Patient states that she last drank heavily a couple years ago, with lighter alcohol use last around 3 or 4 months ago 14.  Tobacco abuse will start NicoDerm patchon 1/4 15.  Sinus tachycardia,  asymptomatic no tx for now , BP on low side, may be related to pain and anxiety - no BB due to BP.  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  Constipation improved on Senna S BID   Improving 17. Hypokalemia  K 2.9 on 1/11   Supplementing x2 days  BMET pnd 1/14 18.  Hx asthma pt requests prn inhaler at bedside will order LOS (Days) 11 A FACE TO  FACE EVALUATION WAS PERFORMED  Erick Colacendrew E  07/04/2017, 8:45 AM

## 2017-07-04 NOTE — Progress Notes (Signed)
Occupational Therapy Session Note  Patient Details  Name: Catherine Butler MRN: [id number] Date of Birth: April 13, 1961  Today's Date: 07/04/2017  Session 1 OT Individual Time: 1103-1200 OT Individual Time Calculation (min): 57 min   Session 2 OT Individual Time: 1415-1500 OT Individual Time Calculation (min): 45 min    Short Term Goals: Week 2:  OT Short Term Goal 1 (Week 2): STG=LTG 2/2 ELOS  Skilled Therapeutic Interventions/Progress Updates:    Session 1 OT treatment session focused on UB strength/coordination, community reintegration, wc propulsion, functional ambulation, and activity tolerance. Pt ambulated 40 feet in hallway with RW and close supervision. Worked on fine motor control to sign self out with built up handle on pen.  WC propulsion in hallway to elevators. OT educated pt on safe community acces and wc propulsion in and out of elevators. Pt ambulated in gift shop with close supervision and min guard A for turning with RW. Pt fatigued quickly and only walked ~ 25 feet before reaching max fatigue. UB there-ex using 2 lb bar. 4 sets x15 reps. Pt returned to room and left semi-reclined in bed with needs met.  Session 2 OT treatment session focused on standing balance/endurance. Pt greeted in bathroom upon OT arrival, voided bladder successfully and completed 3/3 toileting steps. Pt ambualted 20 feet in hallway before reaching max fatigue. Utilized wii bowling activity to work on activity tolerance, standing balance, and standing endurance. Pt tolerated 5 mins at longest bout, but completed 10 sit<>stands throughout bowling session. Pt returned to room for time management and left with needs met.   Therapy Documentation Precautions:  Precautions Precautions: Fall Restrictions Weight Bearing Restrictions: No General:  Pain: Pain Assessment Pain Assessment: 0-10 Pain Score: 5  Pain Type: Chronic pain;Neuropathic pain Pain Orientation: Right;Left Pain Descriptors / Indicators:  Aching;Tingling;Pins and needles Pain Onset: On-going Patients Stated Pain Goal: 1 Pain Intervention(s): Repositioned ADL: ADL ADL Comments: Please see functional navigator  See Function Navigator for Current Functional Status.   Therapy/Group: Individual Therapy  Valma Cava 07/04/2017, 3:30 PM

## 2017-07-04 NOTE — Progress Notes (Signed)
Refuses Nicoderm patch, states JUST WANTS A CIGARETTE

## 2017-07-05 ENCOUNTER — Inpatient Hospital Stay (HOSPITAL_COMMUNITY): Payer: Medicaid Other | Admitting: Occupational Therapy

## 2017-07-05 ENCOUNTER — Inpatient Hospital Stay (HOSPITAL_COMMUNITY): Payer: Medicaid Other

## 2017-07-05 ENCOUNTER — Inpatient Hospital Stay (HOSPITAL_COMMUNITY): Payer: Self-pay | Admitting: Occupational Therapy

## 2017-07-05 ENCOUNTER — Inpatient Hospital Stay (HOSPITAL_COMMUNITY): Payer: Self-pay | Admitting: Physical Therapy

## 2017-07-05 ENCOUNTER — Other Ambulatory Visit: Payer: Self-pay

## 2017-07-05 LAB — CBC
HEMATOCRIT: 35.8 % — AB (ref 36.0–46.0)
Hemoglobin: 12 g/dL (ref 12.0–15.0)
MCH: 30.8 pg (ref 26.0–34.0)
MCHC: 33.5 g/dL (ref 30.0–36.0)
MCV: 91.8 fL (ref 78.0–100.0)
PLATELETS: 310 10*3/uL (ref 150–400)
RBC: 3.9 MIL/uL (ref 3.87–5.11)
RDW: 14.7 % (ref 11.5–15.5)
WBC: 4 10*3/uL (ref 4.0–10.5)

## 2017-07-05 LAB — BASIC METABOLIC PANEL
Anion gap: 13 (ref 5–15)
BUN: 12 mg/dL (ref 6–20)
CHLORIDE: 99 mmol/L — AB (ref 101–111)
CO2: 21 mmol/L — ABNORMAL LOW (ref 22–32)
CREATININE: 0.56 mg/dL (ref 0.44–1.00)
Calcium: 8.4 mg/dL — ABNORMAL LOW (ref 8.9–10.3)
GFR calc Af Amer: >60 mL/min (ref >60)
GFR calc non Af Amer: >60 mL/min (ref >60)
GLUCOSE: 120 mg/dL — AB (ref 65–99)
POTASSIUM: 3.3 mmol/L — AB (ref 3.5–5.1)
Sodium: 133 mmol/L — ABNORMAL LOW (ref 135–145)

## 2017-07-05 MED ORDER — METHOCARBAMOL 500 MG PO TABS
500.0000 mg | ORAL_TABLET | Freq: Four times a day (QID) | ORAL | Status: DC | PRN
  Administered 2017-07-05 – 2017-07-06 (×2): 500 mg via ORAL
  Filled 2017-07-05 (×2): qty 1

## 2017-07-05 MED ORDER — SODIUM CHLORIDE 0.9 % IV BOLUS (SEPSIS)
500.0000 mL | Freq: Once | INTRAVENOUS | Status: AC
  Administered 2017-07-05: 500 mL via INTRAVENOUS

## 2017-07-05 MED ORDER — METOPROLOL TARTRATE 12.5 MG HALF TABLET: 12.5000 mg | ORAL_TABLET | Freq: Two times a day (BID) | ORAL | Status: DC

## 2017-07-05 MED ORDER — POTASSIUM CHLORIDE CRYS ER 20 MEQ PO TBCR
20.0000 meq | EXTENDED_RELEASE_TABLET | Freq: Once | ORAL | Status: AC
  Administered 2017-07-05: 20 meq via ORAL
  Filled 2017-07-05: qty 1

## 2017-07-05 MED ORDER — POTASSIUM CHLORIDE CRYS ER 20 MEQ PO TBCR
20.0000 meq | EXTENDED_RELEASE_TABLET | Freq: Two times a day (BID) | ORAL | Status: DC
  Administered 2017-07-05 – 2017-07-06 (×2): 20 meq via ORAL
  Filled 2017-07-05 (×2): qty 1

## 2017-07-05 MED ORDER — PANTOPRAZOLE SODIUM 40 MG PO TBEC
40.0000 mg | DELAYED_RELEASE_TABLET | Freq: Every day | ORAL | Status: DC
  Administered 2017-07-06: 40 mg via ORAL
  Filled 2017-07-05: qty 1

## 2017-07-05 MED ORDER — METOPROLOL TARTRATE 12.5 MG HALF TABLET
12.5000 mg | ORAL_TABLET | Freq: Two times a day (BID) | ORAL | Status: DC
  Administered 2017-07-05: 12.5 mg via ORAL
  Filled 2017-07-05 (×2): qty 1

## 2017-07-05 NOTE — Progress Notes (Signed)
Manual blood pressure taken before given Lopressor. Sitting is 90/60 HR 60. Marissa NestlePam Love, PA notified. Take pressure after standing for three minutes. Manual reads 140/90. Given  Lopressor. Pt jhas concerns she will be too weak and drop her pressure. States " you better keep a close eye on me" Given assurances she would be carefully monitored now and passed on in report.

## 2017-07-05 NOTE — Progress Notes (Signed)
Occupational Therapy Session Note  Patient Details  Name: Catherine Butler MRN: [id number] Date of Birth: 08/21/1960  Today's Date: 07/05/2017 OT Individual Time: [id number] OT Individual Time Calculation (min): 47 min  and Today's Date: 07/05/2017 OT Missed Time: 13 Minutes Missed Time Reason: Patient ill (comment)   Short Term Goals: Week 2:  OT Short Term Goal 1 (Week 2): STG=LTG 2/2 ELOS  Skilled Therapeutic Interventions/Progress Updates:    Upon entering the room, pt laying supine in bed with reacher and clothing bag in bed with her. Pt reports dressing self from bed level with supervision/set up A prior to therapist arrival. Pt requesting to use bathroom and standing from bed with supervision. Pt ambulating with RW 10' into bathroom at supervision level.Pt unable to void and reports feeling "dizzy". Pt seated in wheelchair with BP results of 91/72. Pt seated in wheelchair for grooming tasks at sink with set up A to open containers and increased time for task. Pt remained seated in wheelchair and attempting to eat breakfast while therapist educated pt on energy conservation. Pt continues to report feeling unwell and is not nauseated. RN notified. Pt requesting to return to bed and required supervision for stand pivot with RW. Pt supine in bed with bed alarm activated and call bell within reach upon exiting the room. 13 missed minutes of skilled OT intervention secondary to pt feeling unwell.   Therapy Documentation Precautions:  Precautions Precautions: Fall Restrictions Weight Bearing Restrictions: No General: General OT Amount of Missed Time: 13 Minutes Vital Signs:  Pain: Pain Assessment Pain Assessment: 0-10 Pain Score: 7  Pain Location: Leg Pain Intervention(s): Medication (See eMAR) ADL: ADL ADL Comments: Please see functional navigator Vision   Perception    Praxis   Exercises:   Other Treatments:    See Function Navigator for Current Functional  Status.   Therapy/Group: Individual Therapy  Alen BleacherBradsher, Anya Murphey P 07/05/2017, 8:51 AM

## 2017-07-05 NOTE — Discharge Summary (Signed)
Physician Discharge Summary  Patient ID: Catherine Butler MRN: [id number] DOB/AGE: May 12, 1961 57 y.o.  Admit date: 06/23/2017 Discharge date: 07/06/2017  Discharge Diagnoses:  Principal Problem:   Quadriplegia and quadriparesis (HCC) Active Problems:   Hypokalemia   Sinus tachycardia   Debility   Constipation   Guillain Barr syndrome (HCC)   Generalized anxiety disorder   Acute lower UTI   History of prolonged Q-T interval on ECG   Discharged Condition: stable   Significant Diagnostic Studies: Ct Abdomen Pelvis W Contrast  Result Date: 06/20/2017 CLINICAL DATA:  Nausea and vomiting x4 days. EXAM: CT ABDOMEN AND PELVIS WITH CONTRAST TECHNIQUE: Multidetector CT imaging of the abdomen and pelvis was performed using the standard protocol following bolus administration of intravenous contrast. CONTRAST:  ISOVUE-300 IOPAMIDOL (ISOVUE-300) INJECTION 61% COMPARISON:  03/26/2017 CT FINDINGS: Lower chest: Moderate-sized hiatal hernia. Normal sized heart without pericardial effusion. No active pulmonary disease. Minimal subsegmental atelectasis in the right lower lobe. Hepatobiliary: Hepatic steatosis. Nondistended gallbladder. No cholelithiasis or biliary dilatation. Pancreas: Atrophic pancreas without acute inflammation or space-occupying mass. No ductal dilatation. Scattered calcifications of the pancreatic gland consistent with stigmata of chronic pancreatitis. Spleen: Normal in size without focal abnormality. Adrenals/Urinary Tract: Adrenal glands are unremarkable. Kidneys are normal, without renal calculi, focal lesion, or hydronephrosis. Bladder is unremarkable. Stomach/Bowel: Nondistended stomach with normal small bowel rotation. Normal appendix. Large amount of fecal retention throughout the colon. Vascular/Lymphatic: Mild aortic atherosclerosis. No aneurysm or dissection. No lymphadenopathy. Reproductive: Atrophic uterus without adnexal mass. Other: No free air nor free fluid.  Musculoskeletal: Chronic superior endplate compression of T11. No acute osseous findings. IMPRESSION: 1. Increased fecal retention, query constipation. 2. Hepatic steatosis. 3. Stable moderate-sized hiatal hernia. 4. Stigmata of chronic pancreatitis with scattered pancreatic gland calcifications. 5. Aortic atherosclerosis. Electronically Signed   By: Tollie Eth M.D.   On: 06/20/2017 19:35   Dg Chest Port 1 View  Result Date: 06/20/2017 CLINICAL DATA:  Nausea and vomiting x 4 days - hx bladder pretension - Hx COPD - current everyday smoker EXAM: PORTABLE CHEST 1 VIEW COMPARISON:  CT 04/18/2017.  Chest radiograph 01/01/2017. FINDINGS: Hyperinflation. Remote left rib fractures. Midline trachea. Normal heart size. Moderate hiatal hernia. No pleural effusion or pneumothorax. Biapical pleural thickening. Clear lungs. IMPRESSION: No acute cardiopulmonary disease. Hiatal hernia. Electronically Signed   By: Jeronimo Greaves M.D.   On: 06/20/2017 18:05   Dg Abd Portable 1v  Result Date: 07/05/2017 CLINICAL DATA:  Nausea and vomiting. EXAM: PORTABLE ABDOMEN - 1 VIEW COMPARISON:  06/23/2017 FINDINGS: Bowel gas pattern demonstrates several air-filled large and small bowel loops. There are a few air-filled prominent small bowel loops in the left abdomen without significant dilatation. No evidence of mass or mass effect. No free peritoneal air. Remainder the exam is unchanged. IMPRESSION: Nonspecific, nonobstructive bowel gas pattern. Electronically Signed   By: Elberta Fortis M.D.   On: 07/05/2017 16:05   Dg Abd Portable 1v  Result Date: 06/23/2017 CLINICAL DATA:  Constipation. EXAM: PORTABLE ABDOMEN - 1 VIEW COMPARISON:  06/20/2017 CT FINDINGS: A moderate to large amount of stool within the ascending and descending colon noted. Small amount of stool in the sigmoid colon and rectum noted. No dilated small bowel loops are present. No suspicious bony lesions are identified. IMPRESSION: Moderate to large amount of stool  within the ascending and descending colon which can be seen with constipation. No evidence of bowel obstruction. Electronically Signed   By: Harmon Pier M.D.   On: 06/23/2017 20:24  Labs:  Basic Metabolic Panel: BMP Latest Ref Rng & Units 07/04/2017 07/01/2017 06/24/2017  Glucose 65 - 99 mg/dL 161(W) 98 960(A)  BUN 6 - 20 mg/dL 14 10 <5(W)  Creatinine 0.44 - 1.00 mg/dL 0.98 1.19 1.47  BUN/Creat Ratio 9 - 23 - - -  Sodium 135 - 145 mmol/L 132(L) 136 133(L)  Potassium 3.5 - 5.1 mmol/L 3.8 2.9(L) 4.1  Chloride 101 - 111 mmol/L 99(L) 102 108  CO2 22 - 32 mmol/L 22 20(L) 18(L)  Calcium 8.9 - 10.3 mg/dL 8.2(N) 5.6(O) 1.3(Y)    CBC: CBC Latest Ref Rng & Units 07/04/2017 06/30/2017 06/24/2017  WBC 4.0 - 10.5 K/uL 4.1 2.9(L) 3.4(L)  Hemoglobin 12.0 - 15.0 g/dL 11.8(L) 10.4(L) 10.9(L)  Hematocrit 36.0 - 46.0 % 35.1(L) 31.8(L) 31.9(L)  Platelets 150 - 400 K/uL 281 243 197    CBG: No results for input(s): GLUCAP in the last 168 hours.  Brief HPI:   [name] a 57 y.o.femalewith history of anxiety disorder, depression,alcohol abuse,COPD, GBS 03/2017 treated with th IVIG and recommendations of EMG by neurology on outpatient basis. she was admitted on [facility] on 06/20/17 with nausea/vomiting x four days and weakness. CT abdomen done revealing hepatic steatosis, stigmata of chronic pancreatitis with pancreatitic calcifications, increased fecal retention and stable moderate HH. She has had issues with pain control, urinary retention as well as  Electrolyte abnormality felt to be due to viral syndrome v/s food poisoning. Symptoms had resolved but she continued to be limited by diffuse weakness, poor safety and neuropathy affecting functional status. CIR was recommended for aggressive rehab program.    Hospital Course: ANALY BASSFORD was admitted to rehab 06/23/2017 for inpatient therapies to consist of PT and OT at least three hours five days a week. Past admission physiatrist, therapy team and rehab RN  have worked together to provide customized collaborative inpatient rehab. Blood pressure were monitored on bid basis and have been Follow up labs done past admission showed that electrolyte abnormalities had resolved.  Due to reports of urinary retention UA/UCS was ordered and she was started on toileting schedule.  PVRs X 3  were ordered to monitor voiding function and showed volumes from 20- 93 cc. She was found to have E coli UTI and was treated with 7 day course of Keflex.  KUB done revealing moderate to large amount of stool in colon and she was started on bowel program for issues with chronic constipation.   EKG done at admission revealed sinus tachycardia with prolonged QT and PR interval at 138.  She continues to have asymptomatic tachycardia with HR in 110-120 range and tachycardia felt to be due to autonomic neuropathy from GBS.   Neuropsychology/Dr. Kieth Brightly has been following to help with coping and adjustment issues.   Neurontin was titrated upwards to help with neuropathic symptoms and hydrocodone has been weaned off. She did have issues with hypotension as well as nausea day prior to discharge felt to be due to increase in robaxin. She was treated with fluid bolus and robaxin was decreased to 500 mg qid prn. Mild hypokalemia has resolved with supplementation and she is to continue on this for 2 additional days. She was feeling better by 1/16 am and reported that GI symptoms had resolved. Protonix and hydrocodone were discontinued at discharge due to history of QT prolongation. She has progressed to supervision level and will continue to receive follow up HHPT, HHOT and HHSW by [facility] after discharge.    Rehab  course: During patient's stay in rehab weekly team conferences were held to monitor patient's progress, set goals and discuss barriers to discharge. At admission, patient required mod assist with mobility and basic self care tasks. She has had improvement in activity  tolerance, balance, postural control, as well as ability to compensate for deficits.  She is able to complete ADL tasks with set up assist. She requires supervision with transfers and to ambulate 30' X 2 with rest breaks. Family was unable to come in for family education. She was educated on need for supervision with mobility as well as importance of maintaining HEP to help build up endurance.     Disposition: 01-Home or Self Care  Diet: Regular.   Special Instructions: 1. Hold laxatives for 1-2 days. 2. Need to follow up with [facility] for evaluation of your anxiety medications due to history of prolonged QT on EKG.  3. Need sit up in chair most of the day to help with blood pressure and hear rate  4. Repeat  BMET in 7-10 days.  Discharge Instructions    Ambulatory referral to Physical Medicine Rehab   Complete by:  As directed    1-2 weeks transitional care appt     Allergies as of 07/06/2017      Reactions   Lipitor [atorvastatin] Other (See Comments)   "really bad leg pain"      Medication List    STOP taking these medications   dimenhyDRINATE 50 MG tablet Commonly known as:  DRAMAMINE   HYDROcodone-acetaminophen 5-325 MG tablet Commonly known as:  NORCO/VICODIN   hydrOXYzine 25 MG tablet Commonly known as:  ATARAX/VISTARIL   metoprolol tartrate 25 MG tablet Commonly known as:  LOPRESSOR   omeprazole 40 MG capsule Commonly known as:  PRILOSEC   ondansetron 4 MG tablet Commonly known as:  ZOFRAN     TAKE these medications   acetaminophen 500 MG tablet Commonly known as:  TYLENOL Take 1,000 mg by mouth 2 (two) times daily as needed for headache.   albuterol 108 (90 Base) MCG/ACT inhaler Commonly known as:  PROVENTIL HFA;VENTOLIN HFA Inhale 2 puffs into the lungs every 6 (six) hours as needed for wheezing.   gabapentin 400 MG capsule Commonly known as:  NEURONTIN Take 2 capsules (800 mg total) by mouth 3 (three) times daily. What changed:    how much to  take  when to take this   methocarbamol 500 MG tablet Commonly known as:  ROBAXIN Take 1 tablet (500 mg total) by mouth every 6 (six) hours as needed for muscle spasms.   mirtazapine 15 MG tablet Commonly known as:  REMERON Take 15 mg by mouth at bedtime.   polyethylene glycol packet Commonly known as:  MIRALAX / GLYCOLAX Take 17 g by mouth 2 (two) times daily.   potassium chloride SA 20 MEQ tablet Commonly known as:  K-DUR,KLOR-CON Take 1 tablet (20 mEq total) by mouth daily.   QUEtiapine 400 MG tablet Commonly known as:  SEROQUEL Take 400 mg by mouth at bedtime.   senna-docusate 8.6-50 MG tablet Commonly known as:  Senokot-S Take 2 tablets by mouth 2 (two) times daily.   traMADol 50 MG tablet Commonly known as:  ULTRAM Take 1 tablet (50 mg total) by mouth every 6 (six) hours as needed for moderate pain.      Follow-up Information    Kirsteins, Victorino Sparrow, MD Follow up.   Specialty:  Physical Medicine and Rehabilitation Why:  office will call  you with  follow up appointment Contact information: [address] [place] [address] [phone]        Anson FretAhern, Antonia B, MD. Call.   Specialty:  Neurology Why:  for follow up appointment Contact information: [address] [place] [address] [phone]        Loletta SpecterGomez, Roger David, PA-C Follow up on 07/18/2017.   Specialty:  Physician Assistant Why:  appointment @ 11:00 AM Contact information: [place] [place] [address] [phone]           Signed: Jacquelynn Creeamela S Justyce Yeater 07/06/2017, 2:03 PM

## 2017-07-05 NOTE — Progress Notes (Signed)
Physical Therapy Discharge Summary  Patient Details  Name: Catherine Butler MRN: [id number] Date of Birth: 02/24/1961  Today's Date: 07/05/2017 PT Individual Time: 7619-5093 PT Individual Time Calculation (min): 45 min    Patient has met 10 of 11 long term goals due to improved activity tolerance, improved balance, improved postural control, increased strength and improved coordination.  Patient to discharge at wheelchair and short distance ambulation level Supervision.   Patient's care partner did not come to hospital for family education, therefore unable to assess whether they are able to provide the necessary supervision assistance at discharge.  Reasons goals not met:  Pt did not attempt to complete floor transfer.   Recommendation:  Patient will benefit from ongoing skilled PT services in home health setting to continue to advance safe functional mobility, address ongoing impairments in activity tolerance, balance, coordination, strength, and motor control, and minimize fall risk.  Equipment: No equipment provided  Reasons for discharge: treatment goals met  Patient/family agrees with progress made and goals achieved: Yes   Skilled PT Intervention: Pt reporting nausea and pain but does not rate.  Reports her BP was low and she did not take her medications with food this AM.  With encouragement, pt agreeable to sit EOB.  BP EOB 84/64, following x10 reps hip flexion, LAQ, and heel/toe raises in sitting BP 93/65, and following sit<>stand 112/82.  Encouraged pt that activity and sitting upright would help BP stay more elevated, but pt continues to decline to remain out of bed at completion of therapy sessions.    Session focus on d/c assessment, transfers, gait, and activity tolerance.  Pt transferred to w/c with and without RW throughout session with supervision.  Car transfer with supervision.  Gait 2x30' with seated rest break in between with supervision and RW.  PT provided education to  pt that she should not attempt ambulation at home without close supervision from a caregiver.  Also continued education regarding benefits of being out of bed throughout the day and progressing activity to return to level of function prior to initial dx of GBS.  Pt verbalized understanding, however continues to request back to bed at end of session.  Call bell in reach and needs met.   PT Discharge Precautions/Restrictions Precautions Precautions: Fall Pain Pain Assessment Pain Assessment: 0-10 Pain Score: 6  Pain Type: Chronic pain Pain Location: Leg Pain Intervention(s): Emotional support Vision/Perception  Perception Perception: Within Functional Limits Praxis Praxis: Intact  Cognition Overall Cognitive Status: Within Functional Limits for tasks assessed Arousal/Alertness: Awake/alert Orientation Level: Oriented X4 Attention: Selective Selective Attention: Appears intact Memory: Appears intact Awareness: Appears intact Problem Solving: Appears intact Behaviors: Poor frustration tolerance Safety/Judgment: Impaired Sensation Sensation Light Touch: Appears Intact(LEs) Additional Comments: neuropathy in BLEs/BUEs baseline Coordination Gross Motor Movements are Fluid and Coordinated: No Fine Motor Movements are Fluid and Coordinated: No Coordination and Movement Description: BUE tremors, ataxia Motor  Motor Motor: Tetraplegia;Ataxia Motor - Discharge Observations: ongoing weakness and ataxia in quad pattern  Mobility Bed Mobility Bed Mobility: Supine to Sit;Sit to Supine Supine to Sit: 6: Modified independent (Device/Increase time) Sit to Supine: 6: Modified independent (Device/Increase time) Transfers Stand Pivot Transfers: 5: Supervision Squat Pivot Transfers: 5: Supervision Locomotion     Trunk/Postural Assessment  Cervical Assessment Cervical Assessment: Within Functional Limits Thoracic Assessment Thoracic Assessment: Exceptions to WFL(mild kyphosis) Lumbar  Assessment Lumbar Assessment: Within Functional Limits Postural Control Postural Control: Deficits on evaluation Postural Limitations: decreased  Balance Balance Balance Assessed: Yes Static Sitting Balance  Static Sitting - Balance Support: Feet supported Static Sitting - Level of Assistance: 6: Modified independent (Device/Increase time) Dynamic Sitting Balance Dynamic Sitting - Balance Support: During functional activity Dynamic Sitting - Level of Assistance: 5: Stand by assistance(for LB dressing) Static Standing Balance Static Standing - Level of Assistance: 5: Stand by assistance Dynamic Standing Balance Dynamic Standing - Level of Assistance: 5: Stand by assistance Extremity Assessment      RLE Assessment RLE Assessment: Exceptions to [facility] RLE Strength Right Hip Flexion: 3+/5 Right Knee Flexion: 3+/5 Right Knee Extension: 4/5 Right Ankle Dorsiflexion: 3+/5 Right Ankle Plantar Flexion: 3+/5 LLE Assessment LLE Assessment: Exceptions to [facility] LLE Strength Left Hip Flexion: 3+/5 Left Knee Flexion: 4/5 Left Knee Extension: 4+/5 Left Ankle Dorsiflexion: 4/5 Left Ankle Plantar Flexion: 4/5   See Function Navigator for Current Functional Status.  Catherine Butler 07/05/2017, 9:35 AM

## 2017-07-05 NOTE — Progress Notes (Addendum)
Patient with three episodes of N/V and poor intake of meals. She was admitted with similar symptoms and showed evidence of obstipation.  Will check KUB. Abdominal exam benign--soft with +BS.  Orthostatic BP check noted. Blood pressure and heart rate elevation noted with activity. Will resume low dose BB.

## 2017-07-05 NOTE — Progress Notes (Signed)
Occupational Therapy Discharge Summary  Patient Details  Name: Catherine Butler MRN: [id number] Date of Birth: 1961-02-20  Today's Date: 07/05/2017 OT Individual Time: 1420-1530 OT Individual Time Calculation (min): 70 min    OT treatment session focused on pt/family education, energy conservation, and activity tolerance. Co-treat with Rec Therapy throughout session. Pt reported improved status, but continued to have some dizziness throughout session, but no nausea. Orthostatic vitals taken and listed below.  B UE strength/coordination with wc propulsion. Practiced tub bench transfer in simulated home environment with supervision. Discussed home set-up and safe BADL modifications within home environment. Worked on activity tolerance with functional ambulation. Pt ambulated 50 feet x2 with extended rest breaks in between. Brought pt to long bright hallway for encouragement and pt ambulated 170 ft with supervision and RW. Pt returned to room at end of session and left seated in recliner with needs met and nursing student present.     07/05/17 1530  Therapy Vitals  Patient Position (if appropriate) Orthostatic Vitals  Orthostatic Sitting  BP- Sitting 104/77  Pulse- Sitting 118  Orthostatic Standing at 0 minutes  BP- Standing at 0 minutes (!) 129/110  Pulse- Standing at 0 minutes 124  Orthostatic Standing at 3 minutes  BP- Standing at 3 minutes (!) 161/125 (after ambulating 3 mins)  Pulse- Standing at 3 minutes 125      Patient has met 10 of 10 long term goals due to improved activity tolerance, improved balance, postural control, ability to compensate for deficits and functional use of  RIGHT upper, RIGHT lower, LEFT upper and LEFT lower extremity.  Patient to discharge at overall Supervision level.  Patient's care partner is independent to provide the necessary physical assistance for higher level iADL at discharge.    Reasons goals not met: n/a  Recommendation:  Patient will benefit from  ongoing skilled OT services in home health setting to continue to advance functional skills in the area of BADL.  Equipment: tub transfer bench  Reasons for discharge: treatment goals met and discharge from hospital  Patient/family agrees with progress made and goals achieved: Yes  OT Discharge Precautions/Restrictions  Precautions Precautions: Fall Restrictions Weight Bearing Restrictions: No Pain Pain Assessment Pain Assessment: No/denies pain Pain Score: 7  Pain Location: Leg Pain Orientation: Distal;Left;Right Pain Descriptors / Indicators: Stabbing Pain Intervention(s): Ambulation/increased activity;Repositioned ADL ADL Eating: Modified independent Grooming: Modified independent Upper Body Bathing: Supervision/safety Lower Body Bathing: Supervision/safety Upper Body Dressing: Supervision/safety Lower Body Dressing: Supervision/safety Toileting: Supervision/safety Toilet Transfer: Distant supervision Toilet Transfer Method: Ambulating Tub/Shower Transfer: Distant supervision Tub/Shower Transfer Method: Ambulating Tub/Shower Equipment: Transfer tub bench ADL Comments: Please see functional navigator Perception  Perception: Within Functional Limits Praxis Praxis: Intact Cognition Overall Cognitive Status: Within Functional Limits for tasks assessed Arousal/Alertness: Awake/alert Orientation Level: Oriented X4 Behaviors: Poor frustration tolerance Safety/Judgment: Impaired Sensation Sensation Light Touch: Appears Intact(LEs) Light Touch Impaired Details: Impaired RUE;Impaired LUE;Impaired RLE;Impaired LLE Additional Comments: neuropathy in BLEs/BUEs baseline Coordination Gross Motor Movements are Fluid and Coordinated: No Fine Motor Movements are Fluid and Coordinated: No Coordination and Movement Description: BUE tremors, ataxia Motor  Motor Motor: Tetraplegia;Ataxia Motor - Discharge Observations: ongoing weakness and ataxia in quad pattern Mobility   Bed Mobility Bed Mobility: Supine to Sit;Sit to Supine Supine to Sit: 6: Modified independent (Device/Increase time) Sit to Supine: 6: Modified independent (Device/Increase time)  Trunk/Postural Assessment  Cervical Assessment Cervical Assessment: Within Functional Limits Thoracic Assessment Thoracic Assessment: Exceptions to WFL(mild kyphosis) Lumbar Assessment Lumbar Assessment: Within Functional Limits Postural Control  Postural Control: Deficits on evaluation Postural Limitations: decreased  Balance Balance Balance Assessed: Yes Static Sitting Balance Static Sitting - Balance Support: Feet supported Static Sitting - Level of Assistance: 6: Modified independent (Device/Increase time) Dynamic Sitting Balance Dynamic Sitting - Balance Support: During functional activity Dynamic Sitting - Level of Assistance: 5: Stand by assistance(for LB dressing) Static Standing Balance Static Standing - Level of Assistance: 5: Stand by assistance Dynamic Standing Balance Dynamic Standing - Level of Assistance: 5: Stand by assistance Extremity/Trunk Assessment RUE Assessment RUE Assessment: Exceptions to [facility] RUE Strength RUE Overall Strength: Deficits RUE Overall Strength Comments: Limited shoulder ROM  ~(90 FF 2/2 painful joint Right Shoulder Flexion: 4-/5 LUE Assessment LUE Assessment: Exceptions to [facility] LUE Strength LUE Overall Strength: Deficits LUE Overall Strength Comments:\ Left Shoulder Flexion: 4-/5   See Function Navigator for Current Functional Status.  Daneen Schick [name] 07/05/2017, 3:34 PM

## 2017-07-05 NOTE — Progress Notes (Signed)
Recreational Therapy Session Note  Patient Details  Name: Catherine Butler MRN: [id number] Date of Birth: 02/09/1961 Today's Date: 07/05/2017  Pain: no c/o Skilled Therapeutic Interventions/Progress Updates: Session originally planned to focus on community reintegration skills, but pt with c/o of dizziness so transitioned to discussion about discharge planning, energy conservation and worked on activity tolerance.  Due to c/o dizziness, BP monitored throughout session and noted in OT progress note.  Pt ambulated with RW ~50' x2 with supervision and rest break in between.  Pt enjoys the brightness of the hallway connecting the north tower to the central wing.  Pt propelled w/c ~25' using BUE's.  Once in the hallway, pt ambulated ~170' with RW with supervision.  RN made aware of the above.  Therapy/Group: Co-Treatment   Angelina Neece 07/05/2017, 4:01 PM

## 2017-07-05 NOTE — Progress Notes (Signed)
Subjective/Complaints:  No issues overnite  ROS: + Spasms. Denies CP, SOB, N/V/D  Objective: Vital Signs: Blood pressure 107/63, pulse (!) 117, temperature 98.1 F (36.7 C), temperature source Oral, resp. rate 19, height '5\' 3"'$  (1.6 m), weight 62 kg (136 lb 11 oz), last menstrual period 02/22/2006, SpO2 98 %. No results found. Results for orders placed or performed during the hospital encounter of 06/23/17 (from the past 72 hour(s))  Basic metabolic panel     Status: Abnormal   Collection Time: 07/04/17 12:25 PM  Result Value Ref Range   Sodium 132 (L) 135 - 145 mmol/L   Potassium 3.8 3.5 - 5.1 mmol/L   Chloride 99 (L) 101 - 111 mmol/L   CO2 22 22 - 32 mmol/L   Glucose, Bld 122 (H) 65 - 99 mg/dL   BUN 14 6 - 20 mg/dL   Creatinine, Ser 0.55 0.44 - 1.00 mg/dL   Calcium 8.6 (L) 8.9 - 10.3 mg/dL   GFR calc non Af Amer >60 >60 mL/min   GFR calc Af Amer >60 >60 mL/min    Comment: (NOTE) The eGFR has been calculated using the CKD EPI equation. This calculation has not been validated in all clinical situations. eGFR's persistently <60 mL/min signify possible Chronic Kidney Disease.    Anion gap 11 5 - 15  CBC with Differential/Platelet     Status: Abnormal   Collection Time: 07/04/17 12:25 PM  Result Value Ref Range   WBC 4.1 4.0 - 10.5 K/uL   RBC 3.84 (L) 3.87 - 5.11 MIL/uL   Hemoglobin 11.8 (L) 12.0 - 15.0 g/dL   HCT 35.1 (L) 36.0 - 46.0 %   MCV 91.4 78.0 - 100.0 fL   MCH 30.7 26.0 - 34.0 pg   MCHC 33.6 30.0 - 36.0 g/dL   RDW 14.2 11.5 - 15.5 %   Platelets 281 150 - 400 K/uL   Neutrophils Relative % 38 %   Neutro Abs 1.5 (L) 1.7 - 7.7 K/uL   Lymphocytes Relative 45 %   Lymphs Abs 1.8 0.7 - 4.0 K/uL   Monocytes Relative 15 %   Monocytes Absolute 0.6 0.1 - 1.0 K/uL   Eosinophils Relative 1 %   Eosinophils Absolute 0.1 0.0 - 0.7 K/uL   Basophils Relative 1 %   Basophils Absolute 0.0 0.0 - 0.1 K/uL   Gen: NAD. Vital signs reviewed.  Head:Normocephalicand atraumatic.   Mouth/Throat:Poor dentition Eyes:EOMI. No discharge.  Cardiovascular:Regular rhythm.+Tachycardiapresent.  Respiratory:Effort normaland breath sounds normal.  SW:FUXNA sounds are normal. Nondistended Musculoskeletal: She exhibits noedema.  Tenderness to right wrist, stable. Neurological: She isalertand oriented Speech clear.  Anxious.  Hyperesthesia right hand Motor: 4-/5 bilateral deltoid bicep tricep finger flexors and extensors (pain inhibition, unchanged) 3/5 bilateral hip flexor knee extensor ankle dorsiflexor plantar flexor Skin: Skin iswarmand dry. She isnot diaphoretic.  Psychiatric: [name] is normal. Her mood appearsanxious. She expressesinappropriate judgment. She isinattentive.  Assessment/Plan: 1. Functional deficits secondary to quadriparesis which require 3+ hours per day of interdisciplinary therapy in a comprehensive inpatient rehab setting. Physiatrist is providing close team supervision and 24 hour management of active medical problems listed below. Physiatrist and rehab team continue to assess barriers to discharge/monitor patient progress toward functional and medical goals. FIM: Function - Bathing Bathing activity did not occur: Refused Position: Shower Body parts bathed by patient: Right arm, Left arm, Chest, Abdomen, Front perineal area, Buttocks, Right upper leg, Left upper leg, Left lower leg, Right lower leg Body parts bathed by helper: Right lower  leg, Left lower leg, Back Assist Level: Supervision or verbal cues  Function- Upper Body Dressing/Undressing What is the patient wearing?: Pull over shirt/dress Pull over shirt/dress - Perfomed by patient: Thread/unthread right sleeve, Thread/unthread left sleeve, Put head through opening, Pull shirt over trunk Pull over shirt/dress - Perfomed by helper: Put head through opening, Pull shirt over trunk Assist Level: More than reasonable time Set up : To obtain clothing/put away Function -  Lower Body Dressing/Undressing What is the patient wearing?: Pants, Non-skid slipper socks Position: Sitting EOB Pants- Performed by patient: Thread/unthread right pants leg, Pull pants up/down, Thread/unthread left pants leg Pants- Performed by helper: Thread/unthread right pants leg, Thread/unthread left pants leg, Pull pants up/down Non-skid slipper socks- Performed by patient: Don/doff right sock, Don/doff left sock Non-skid slipper socks- Performed by helper: Don/doff right sock, Don/doff left sock Assist for footwear: Supervision/touching assist Assist for lower body dressing: Touching or steadying assistance (Pt > 75%)  Function - Toileting Toileting activity did not occur: No continent bowel/bladder event Toileting steps completed by patient: Adjust clothing prior to toileting, Performs perineal hygiene, Adjust clothing after toileting Toileting steps completed by helper: Adjust clothing prior to toileting, Adjust clothing after toileting Toileting Assistive Devices: Grab bar or rail Assist level: Touching or steadying assistance (Pt.75%)  Function - Air cabin crew transfer activity did not occur: Refused Toilet transfer assistive device: Grab bar Assist level to toilet: Touching or steadying assistance (Pt > 75%) Assist level from toilet: Touching or steadying assistance (Pt > 75%)  Function - Chair/bed transfer Chair/bed transfer method: Stand pivot Chair/bed transfer assist level: Touching or steadying assistance (Pt > 75%) Chair/bed transfer assistive device: Armrests, Walker Chair/bed transfer details: Verbal cues for safe use of DME/AE, Verbal cues for precautions/safety  Function - Locomotion: Wheelchair Will patient use wheelchair at discharge?: Yes Type: Manual Max wheelchair distance: 100 Assist Level: Supervision or verbal cues Wheel 50 feet with 2 turns activity did not occur: Refused Assist Level: Supervision or verbal cues Wheel 150 feet activity did  not occur: Refused Assist Level: Supervision or verbal cues Turns around,maneuvers to table,bed, and toilet,negotiates 3% grade,maneuvers on rugs and over doorsills: No Function - Locomotion: Ambulation Assistive device: Walker-rolling Max distance: 55' Assist level: Touching or steadying assistance (Pt > 75%) Assist level: Touching or steadying assistance (Pt > 75%) Walk 50 feet with 2 turns activity did not occur: Safety/medical concerns Assist level: Touching or steadying assistance (Pt > 75%) Walk 150 feet activity did not occur: Safety/medical concerns Walk 10 feet on uneven surfaces activity did not occur: Safety/medical concerns  Function - Comprehension Comprehension: Auditory Comprehension assist level: Follows complex conversation/direction with extra time/assistive device  Function - Expression Expression: Verbal Expression assist level: Expresses complex ideas: With extra time/assistive device  Function - Social Interaction Social Interaction assist level: Interacts appropriately with others with medication or extra time (anti-anxiety, antidepressant).  Function - Problem Solving Problem solving assist level: Solves complex problems: With extra time  Function - Memory Memory assist level: More than reasonable amount of time Patient normally able to recall (first 3 days only): Current season, Location of own room, That he or she is in a hospital, Staff names and faces   Medical Problem List and Plan: 1.   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 DVT Prophylaxis/Anticoagulation: Pharmaceutical:Lovenox 3. Pain Management:switched Lyrica to gabapentin for insurance reasons.   Using prn hydrocodone around the clock,Will  change to  tramadol for D/C     Methocarbamol increased to 1000 every 6 when necessary on 1/13 4. Mood:LCSW to provide ego support. LCSW to follow for evaluation and support. 5. Neuropsych: This  patientiscapable of making decisions on herown behalf. 6. Skin/Wound Care:routine pressure relief measurs 7. Fluids/Electrolytes/Nutrition:Recheck lytes weekly 8. ABLA: Recheck CBC weekly.   Hb 10.4 on 1/10- improved to 11.8 on 1/14 9. H/o Constipation: CT with evidence of constipation. Scheduled miralax daily.  10. Urinary retention and UTI.    resolved 11. Anxiety disorder: Continue Remeron and Seroquel at bedtime.   Sees Dr. Sabra Heck at behavioral health recommend neuropsych eval 12. Neuropathy: continue Gabapentin 600 mg qid.no sig limitation on fxn 13.  History of alcohol abuse has CT abdomen evidence of fatty liver plus chronic pancreatitis.  Patient states that she last drank heavily a couple years ago, with lighter alcohol use last around 3 or 4 months ago 14.  Tobacco abuse will start NicoDerm patchon 1/4 15.  Sinus tachycardia,  asymptomatic no tx for now , BP on low side, may be related to pain and anxiety - no BB due to BP.  May have some autonomic neuropathy related to GBS  Vitals:   07/04/17 1430 07/05/17 0132  BP: 110/71 107/63  Pulse: (!) 113 (!) 117  Resp: 18 19  Temp: 97.9 F (36.6 C) 98.1 F (36.7 C)  SpO2: 97% 98%    Stable on 1/15,although pt states am systolic is 91, will reduce robaxin, hydrocodone may be contributing will check orthostatic vitals 16.  Constipation improved on Senna S BID   Improving 17. Hypokalemia  K 2.9 on 1/11->3.8 on 1/14 improved   Supplementing    18.  Hx asthma pt requests prn inhaler at bedside will order LOS (Days) 12 A FACE TO FACE EVALUATION WAS PERFORMED  Charlett Blake 07/05/2017, 8:25 AM

## 2017-07-06 DIAGNOSIS — Z87898 Personal history of other specified conditions: Secondary | ICD-10-CM

## 2017-07-06 DIAGNOSIS — G990 Autonomic neuropathy in diseases classified elsewhere: Secondary | ICD-10-CM

## 2017-07-06 LAB — BASIC METABOLIC PANEL
ANION GAP: 12 (ref 5–15)
BUN: 10 mg/dL (ref 6–20)
CO2: 18 mmol/L — AB (ref 22–32)
Calcium: 8.8 mg/dL — ABNORMAL LOW (ref 8.9–10.3)
Chloride: 106 mmol/L (ref 101–111)
Creatinine, Ser: 0.56 mg/dL (ref 0.44–1.00)
GFR calc Af Amer: >60 mL/min (ref >60)
GFR calc non Af Amer: >60 mL/min (ref >60)
GLUCOSE: 142 mg/dL — AB (ref 65–99)
POTASSIUM: 4 mmol/L (ref 3.5–5.1)
Sodium: 136 mmol/L (ref 135–145)

## 2017-07-06 MED ORDER — TRAMADOL HCL 50 MG PO TABS: 50.0000 mg | ORAL_TABLET | Freq: Four times a day (QID) | ORAL | 0 refills | Status: DC | PRN

## 2017-07-06 MED ORDER — GABAPENTIN 400 MG PO CAPS: 800.0000 mg | ORAL_CAPSULE | Freq: Three times a day (TID) | ORAL | 0 refills | Status: DC

## 2017-07-06 MED ORDER — METHOCARBAMOL 500 MG PO TABS: 500.0000 mg | ORAL_TABLET | Freq: Four times a day (QID) | ORAL | 0 refills | Status: DC | PRN

## 2017-07-06 MED ORDER — POLYETHYLENE GLYCOL 3350 17 G PO PACK
17.0000 g | PACK | Freq: Two times a day (BID) | ORAL | 0 refills | Status: DC
Start: 2017-07-06 — End: 2017-08-29

## 2017-07-06 MED ORDER — OMEPRAZOLE 40 MG PO CPDR: 40.0000 mg | DELAYED_RELEASE_CAPSULE | Freq: Two times a day (BID) | ORAL | 1 refills | Status: DC

## 2017-07-06 MED ORDER — SENNOSIDES-DOCUSATE SODIUM 8.6-50 MG PO TABS: 2.0000 | ORAL_TABLET | Freq: Two times a day (BID) | ORAL | 0 refills | Status: DC

## 2017-07-06 MED ORDER — POTASSIUM CHLORIDE CRYS ER 20 MEQ PO TBCR: 20.0000 meq | EXTENDED_RELEASE_TABLET | Freq: Every day | ORAL | 0 refills | Status: DC

## 2017-07-06 NOTE — Significant Event (Signed)
Refused Lopressor d/t BP low previous day after receiving Lopressor. BP WNL today however pt was still not comfortable taking medication. RN aware  Nicotine patch not removed d/t no patch in place. Pt states has been refusing patch placement. RN aware.

## 2017-07-06 NOTE — Progress Notes (Signed)
Subjective/Complaints:  No issues overnite  ROS: + Spasms. Denies CP, SOB, N/V/D  Objective: Vital Signs: Blood pressure 99/75, pulse (!) 110, temperature 98.9 F (37.2 C), temperature source Oral, resp. rate 18, height '5\' 3"'$  (1.6 m), weight 62.2 kg (137 lb 0.4 oz), last menstrual period 02/22/2006, SpO2 100 %. Dg Abd Portable 1v  Result Date: 07/05/2017 CLINICAL DATA:  Nausea and vomiting. EXAM: PORTABLE ABDOMEN - 1 VIEW COMPARISON:  06/23/2017 FINDINGS: Bowel gas pattern demonstrates several air-filled large and small bowel loops. There are a few air-filled prominent small bowel loops in the left abdomen without significant dilatation. No evidence of mass or mass effect. No free peritoneal air. Remainder the exam is unchanged. IMPRESSION: Nonspecific, nonobstructive bowel gas pattern. Electronically Signed   By: Marin Olp M.D.   On: 07/05/2017 16:05   Results for orders placed or performed during the hospital encounter of 06/23/17 (from the past 72 hour(s))  Basic metabolic panel     Status: Abnormal   Collection Time: 07/04/17 12:25 PM  Result Value Ref Range   Sodium 132 (L) 135 - 145 mmol/L   Potassium 3.8 3.5 - 5.1 mmol/L   Chloride 99 (L) 101 - 111 mmol/L   CO2 22 22 - 32 mmol/L   Glucose, Bld 122 (H) 65 - 99 mg/dL   BUN 14 6 - 20 mg/dL   Creatinine, Ser 0.55 0.44 - 1.00 mg/dL   Calcium 8.6 (L) 8.9 - 10.3 mg/dL   GFR calc non Af Amer >60 >60 mL/min   GFR calc Af Amer >60 >60 mL/min    Comment: (NOTE) The eGFR has been calculated using the CKD EPI equation. This calculation has not been validated in all clinical situations. eGFR's persistently <60 mL/min signify possible Chronic Kidney Disease.    Anion gap 11 5 - 15  CBC with Differential/Platelet     Status: Abnormal   Collection Time: 07/04/17 12:25 PM  Result Value Ref Range   WBC 4.1 4.0 - 10.5 K/uL   RBC 3.84 (L) 3.87 - 5.11 MIL/uL   Hemoglobin 11.8 (L) 12.0 - 15.0 g/dL   HCT 35.1 (L) 36.0 - 46.0 %   MCV  91.4 78.0 - 100.0 fL   MCH 30.7 26.0 - 34.0 pg   MCHC 33.6 30.0 - 36.0 g/dL   RDW 14.2 11.5 - 15.5 %   Platelets 281 150 - 400 K/uL   Neutrophils Relative % 38 %   Neutro Abs 1.5 (L) 1.7 - 7.7 K/uL   Lymphocytes Relative 45 %   Lymphs Abs 1.8 0.7 - 4.0 K/uL   Monocytes Relative 15 %   Monocytes Absolute 0.6 0.1 - 1.0 K/uL   Eosinophils Relative 1 %   Eosinophils Absolute 0.1 0.0 - 0.7 K/uL   Basophils Relative 1 %   Basophils Absolute 0.0 0.0 - 0.1 K/uL  Basic metabolic panel     Status: Abnormal   Collection Time: 07/05/17  4:01 PM  Result Value Ref Range   Sodium 133 (L) 135 - 145 mmol/L   Potassium 3.3 (L) 3.5 - 5.1 mmol/L   Chloride 99 (L) 101 - 111 mmol/L   CO2 21 (L) 22 - 32 mmol/L   Glucose, Bld 120 (H) 65 - 99 mg/dL   BUN 12 6 - 20 mg/dL   Creatinine, Ser 0.56 0.44 - 1.00 mg/dL   Calcium 8.4 (L) 8.9 - 10.3 mg/dL   GFR calc non Af Amer >60 >60 mL/min   GFR calc Af Amer >60 >  60 mL/min    Comment: (NOTE) The eGFR has been calculated using the CKD EPI equation. This calculation has not been validated in all clinical situations. eGFR's persistently <60 mL/min signify possible Chronic Kidney Disease.    Anion gap 13 5 - 15  CBC     Status: Abnormal   Collection Time: 07/05/17  4:01 PM  Result Value Ref Range   WBC 4.0 4.0 - 10.5 K/uL   RBC 3.90 3.87 - 5.11 MIL/uL   Hemoglobin 12.0 12.0 - 15.0 g/dL   HCT 35.8 (L) 36.0 - 46.0 %   MCV 91.8 78.0 - 100.0 fL   MCH 30.8 26.0 - 34.0 pg   MCHC 33.5 30.0 - 36.0 g/dL   RDW 14.7 11.5 - 15.5 %   Platelets 310 150 - 400 K/uL  Basic metabolic panel     Status: Abnormal   Collection Time: 07/06/17  8:54 AM  Result Value Ref Range   Sodium 136 135 - 145 mmol/L   Potassium 4.0 3.5 - 5.1 mmol/L   Chloride 106 101 - 111 mmol/L   CO2 18 (L) 22 - 32 mmol/L   Glucose, Bld 142 (H) 65 - 99 mg/dL   BUN 10 6 - 20 mg/dL   Creatinine, Ser 0.56 0.44 - 1.00 mg/dL   Calcium 8.8 (L) 8.9 - 10.3 mg/dL   GFR calc non Af Amer >60 >60 mL/min    GFR calc Af Amer >60 >60 mL/min    Comment: (NOTE) The eGFR has been calculated using the CKD EPI equation. This calculation has not been validated in all clinical situations. eGFR's persistently <60 mL/min signify possible Chronic Kidney Disease.    Anion gap 12 5 - 15   Gen: NAD. Vital signs reviewed.  Head:Normocephalicand atraumatic.  Mouth/Throat:Poor dentition Eyes:EOMI. No discharge.  Cardiovascular:Regular rhythm.+Tachycardiapresent.  Respiratory:Effort normaland breath sounds normal.  IO:NGEXB sounds are normal. Nondistended Musculoskeletal: She exhibits noedema.  Tenderness to right wrist, stable. Neurological: She isalertand oriented Speech clear.  Anxious.  Hyperesthesia right hand Motor: 4-/5 bilateral deltoid bicep tricep finger flexors and extensors (pain inhibition, unchanged) 3/5 bilateral hip flexor knee extensor ankle dorsiflexor plantar flexor Skin: Skin iswarmand dry. She isnot diaphoretic.  Psychiatric: [name] is normal. Her mood appearsanxious. She expressesinappropriate judgment. She isinattentive.  Assessment/Plan: 1. Functional deficits secondary to quadriparesis  Stable for D/C today F/u PCP in 3-4 weeks  See D/C summary See D/C instructions FIM: Function - Bathing Bathing activity did not occur: Refused Position: Shower Body parts bathed by patient: Right arm, Left arm, Chest, Abdomen, Front perineal area, Buttocks, Right upper leg, Left upper leg, Left lower leg, Right lower leg Body parts bathed by helper: Right lower leg, Left lower leg, Back Assist Level: Supervision or verbal cues  Function- Upper Body Dressing/Undressing What is the patient wearing?: Pull over shirt/dress(per pt report) Pull over shirt/dress - Perfomed by patient: Thread/unthread right sleeve, Thread/unthread left sleeve, Put head through opening, Pull shirt over trunk Pull over shirt/dress - Perfomed by helper: Put head through opening, Pull shirt  over trunk Assist Level: Supervision or verbal cues, Set up Set up : To obtain clothing/put away Function - Lower Body Dressing/Undressing What is the patient wearing?: Pants, Underwear, Non-skid slipper socks Position: Bed(per pt report) Underwear - Performed by patient: Thread/unthread right underwear leg, Thread/unthread left underwear leg, Pull underwear up/down Pants- Performed by patient: Thread/unthread right pants leg, Pull pants up/down, Thread/unthread left pants leg Pants- Performed by helper: Thread/unthread right pants leg, Thread/unthread left pants  leg, Pull pants up/down Non-skid slipper socks- Performed by patient: Don/doff right sock, Don/doff left sock Non-skid slipper socks- Performed by helper: Don/doff right sock, Don/doff left sock Assist for footwear: Supervision/touching assist Assist for lower body dressing: Set up, Supervision or verbal cues  Function - Toileting Toileting activity did not occur: No continent bowel/bladder event Toileting steps completed by patient: Adjust clothing prior to toileting, Performs perineal hygiene, Adjust clothing after toileting Toileting steps completed by helper: Adjust clothing prior to toileting, Adjust clothing after toileting Toileting Assistive Devices: Grab bar or rail Assist level: Supervision or verbal cues  Function - Air cabin crew transfer activity did not occur: Refused Toilet transfer assistive device: Pension scheme manager level to toilet: Supervision or verbal cues Assist level from toilet: Supervision or verbal cues  Function - Chair/bed transfer Chair/bed transfer method: Stand pivot, Squat pivot Chair/bed transfer assist level: Supervision or verbal cues Chair/bed transfer assistive device: Armrests, Walker Chair/bed transfer details: Verbal cues for precautions/safety  Function - Locomotion: Wheelchair Will patient use wheelchair at discharge?: Yes Type: Manual Max wheelchair distance: 200 Assist  Level: No help, No cues, assistive device, takes more than reasonable amount of time Wheel 50 feet with 2 turns activity did not occur: Refused Assist Level: No help, No cues, assistive device, takes more than reasonable amount of time Wheel 150 feet activity did not occur: Refused Assist Level: No help, No cues, assistive device, takes more than reasonable amount of time Turns around,maneuvers to table,bed, and toilet,negotiates 3% grade,maneuvers on rugs and over doorsills: Yes Function - Locomotion: Ambulation Assistive device: Walker-rolling Max distance: 170 Assist level: Supervision or verbal cues Assist level: Supervision or verbal cues Walk 50 feet with 2 turns activity did not occur: Refused Assist level: Supervision or verbal cues Walk 150 feet activity did not occur: Refused Assist level: Supervision or verbal cues Walk 10 feet on uneven surfaces activity did not occur: Safety/medical concerns  Function - Comprehension Comprehension: Auditory Comprehension assist level: Follows complex conversation/direction with extra time/assistive device  Function - Expression Expression: Verbal Expression assist level: Expresses complex ideas: With extra time/assistive device  Function - Social Interaction Social Interaction assist level: Interacts appropriately with others with medication or extra time (anti-anxiety, antidepressant).  Function - Problem Solving Problem solving assist level: Solves complex problems: With extra time  Function - Memory Memory assist level: More than reasonable amount of time Patient normally able to recall (first 3 days only): Current season, Location of own room, That he or she is in a hospital, Staff names and faces   Medical Problem List and Plan: 1.   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 DVT Prophylaxis/Anticoagulation: Pharmaceutical:Lovenox 3. Pain Management:switched Lyrica to  gabapentin for insurance reasons.   Using prn hydrocodone around the clock,Will  change to tramadol for D/C     Methocarbamol increased to 1000 every 6 when necessary on 1/13 4. Mood:LCSW to provide ego support. LCSW to follow for evaluation and support. 5. Neuropsych: This patientiscapable of making decisions on herown behalf. 6. Skin/Wound Care:routine pressure relief measurs 7. Fluids/Electrolytes/Nutrition:Recheck lytes weekly 8. ABLA: Recheck CBC weekly.   Hb 10.4 on 1/10- improved to 11.8 on 1/14 9. H/o Constipation: CT with evidence of constipation. Scheduled miralax daily.  10. Urinary retention and UTI.    resolved 11. Anxiety disorder: Continue Remeron and Seroquel at bedtime.   Sees Dr. Sabra Heck at behavioral health recommend neuropsych eval 12. Neuropathy: continue Gabapentin 600  mg qid.no sig limitation on fxn 13.  History of alcohol abuse has CT abdomen evidence of fatty liver plus chronic pancreatitis.  Patient states that she last drank heavily a couple years ago, with lighter alcohol use last around 3 or 4 months ago 14.  Tobacco abuse will start NicoDerm patchon 1/4 15.  Sinus tachycardia,  asymptomatic no tx for now , BP on low side, may be related to pain and anxiety - no BB due to BP.   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  Constipation improved on Senna S BID   Improving 17. Hypokalemia  K 2.9 on 1/11->3.8 on 1/14 improved   Supplementing    18.  Hx asthma pt requests prn inhaler at bedside will order LOS (Days) 13 A FACE TO FACE EVALUATION WAS PERFORMED  Charlett Blake 07/06/2017, 9:41 AM

## 2017-07-06 NOTE — Progress Notes (Signed)
Patient and son received discharge instructions from Marissa NestlePam Love, PA-C with verbal understanding. Patient discharged to home with son and patient belongings.

## 2017-07-06 NOTE — Progress Notes (Signed)
Recreational Therapy Discharge Summary Patient Details  Name: Catherine Butler MRN: [id number] Date of Birth: 13-Dec-1960 Today's Date: 07/06/2017  Comments on progress toward goals: Pt has made good progress during LOS and discharging home today at supervision level.  Pt requires extra time and set up assistance for TR tasks.  Education provided on activity analysis with potential modifications, energy conservation, self advocacy, leisure education, coping strategies and community pursuits.  Goals met.  Reasons for discharge: discharge from hospital  Patient/family agrees with progress made and goals achieved: Yes  Lowen Mansouri 07/06/2017, 2:48 PM

## 2017-07-06 NOTE — Significant Event (Signed)
Pt stated was dizzy walking to the bathroom. BP 99/75. States felt better once sitting down. RN aware.

## 2017-07-06 NOTE — Discharge Instructions (Signed)
Inpatient Rehab Discharge Instructions  Catherine Butler Discharge date and time:  07/06/17  Activities/Precautions/ Functional Status: Activity: no lifting, driving, or strenuous exercise till cleared by MD Diet: regular diet Wound Care: none needed   Functional status:  ___ No restrictions     ___ Walk up steps independently _X__ 24/7 supervision/assistance   ___ Walk up steps with assistance ___ Intermittent supervision/assistance  ___ Bathe/dress independently ___ Walk with walker     _X__ Bathe/dress with assistance ___ Walk Independently    ___ Shower independently _X__ Walk with assistance    ___ Shower with assistance ___ No alcohol     ___ Return to work/school ________   Special Instructions: 1. Hold laxatives for 1-2 days. 2. Need to follow up with [facility] for evaluation of your anxiety medications due to history of prolonged QT on EKG.  3. Need sit up in chair most of the day to help with blood pressure and hear rate   COMMUNITY REFERRALS UPON DISCHARGE:    Home Health:   PT, OT & SW  Agency:[facility]   [phone]   Date of last service:07/06/2017  Medical Equipment/Items Ordered:TUB BENCH  Agency/Supplier:[facility]   [phone]  [facility]MATCH PROGRAM FOR ASSISTANCE WITH MEDICATIONS SOCIAL SECURITY AND MEDICAID APPLICATIONS PENDING-FOLLOW UP WITH SERVANT [facility] (DISABILITY) [phone] MEDICAID [place] [phone]   My questions have been answered and I understand these instructions. I will adhere to these goals and the provided educational materials after my discharge from the hospital.  Patient/Caregiver Signature _______________________________ Date __________  Clinician Signature _______________________________________ Date __________  Please bring this form and your medication list with you to all your follow-up doctor's appointments.

## 2017-07-06 NOTE — Progress Notes (Signed)
Social Work  Discharge Note  The overall goal for the admission was met for:   Discharge location: Yes-HOME WITH PARENTS-MOM CAN ASSIST IF NEEDED.  Length of Stay: Yes-13 DAYS  Discharge activity level: Yes-SUPERVISION-MOD/I LEVEL  Home/community participation: Yes  Services provided included: MD, RD, PT, OT, RN, CM, TR, Pharmacy, Neuropsych and SW  Financial Services: Other: PENDING MEDICAID  Follow-up services arranged: Home Health: [facility]-PT,OT,SW, DME: [facility] and Patient/Family has no preference for HH/DME agencies  Comments (or additional information):PT DID WELL AND REACHED MOD/I-SUPERVISION LEVEL GOALS. MATCH PROGRAM GIVEN TO PT FOR MEDICATION ASSISTANCE AND SHE IS TO FOLLOW UP WITH ORANGE CARD, DISABILITY AND MEDICAID APPLICATIONS.   Patient/Family verbalized understanding of follow-up arrangements: Yes  Individual responsible for coordination of the follow-up plan: SELF & MARGARET-MOM  Confirmed correct DME delivered: Elease Hashimoto 07/06/2017    Elease Hashimoto

## 2017-07-07 ENCOUNTER — Telehealth: Payer: Self-pay

## 2017-07-07 ENCOUNTER — Telehealth: Payer: Self-pay | Admitting: *Deleted

## 2017-07-07 DIAGNOSIS — G610 Guillain-Barre syndrome: Secondary | ICD-10-CM

## 2017-07-07 DIAGNOSIS — G8250 Quadriplegia, unspecified: Secondary | ICD-10-CM

## 2017-07-07 NOTE — Telephone Encounter (Addendum)
Referral placed  ----- Message from Jacquelynn CreePamela S Love, PA-C sent at 07/06/2017  2:19 PM EST ----- Hello Lilyian Quayle, Could you please send a referral to Dr. Lucia GaskinsAhern on Ms. Horsey. She has history of GBS and was supposed to follow up with her in December. She does not have any insurance but  is going to work on getting the orange card.  Thanks,  Rinaldo CloudPamela

## 2017-07-07 NOTE — Telephone Encounter (Signed)
Transitional Care call  Patient name: Catherine Butler(Catherine Butler) DOB: (17-Dec-1960) 1. Are you/is patient experiencing any problems since coming home? (No) a. Are there any questions regarding any aspect of care? (No) 2. Are there any questions regarding medications administration/dosing? (No) a. Are meds being taken as prescribed? (Yes) b. "Patient should review meds with caller to confirm"  3. Have there been any falls? (No) 4. Has Home Health been to the house and/or have they contacted you? (Yes, they are coming out today (07/07/17)) a. If not, have you tried to contact them? (N/A) b. Can we help you contact them? (No) 5. Are bowels and bladder emptying properly? (Yes) a. Are there any unexpected incontinence issues? (No) b. If applicable, is patient following bowel/bladder programs? (N/A) 6. Any fevers, problems with breathing, unexpected pain? (No) 7. Are there any skin problems or new areas of breakdown? (No) 8. Has the patient/family member arranged specialty MD follow up (ie cardiology/neurology/renal/surgical/etc.)?  (Yes) a. Can we help arrange? (No) 9. Does the patient need any other services or support that we can help arrange? (No) 10. Are caregivers following through as expected in assisting the patient? (Yes) 11. Has the patient quit smoking, drinking alcohol, or using drugs as recommended? (No, patient is still smoking cigarettes)  Appointment date/time (07/15/17 @ 1:15pm), arrive time (12:45pm) and who it is with here Chief of Staff(Kirsteins) [address]

## 2017-07-07 NOTE — Telephone Encounter (Signed)
[name] PT [facility] called requesting verbal orders for 1xwk X 1wk followed by 2xwk X 3wks.  Called her back and approved verbal orders.

## 2017-07-15 ENCOUNTER — Encounter: Payer: Self-pay | Admitting: Physical Medicine & Rehabilitation

## 2017-07-15 ENCOUNTER — Telehealth (INDEPENDENT_AMBULATORY_CARE_PROVIDER_SITE_OTHER): Payer: Self-pay | Admitting: Physician Assistant

## 2017-07-15 NOTE — Telephone Encounter (Signed)
FWD to PCP. Tempestt S Roberts, CMA  

## 2017-07-15 NOTE — Telephone Encounter (Signed)
Patient is aware. Catherine Butler, CMA  

## 2017-07-15 NOTE — Telephone Encounter (Signed)
Catherine Butler

## 2017-07-15 NOTE — Telephone Encounter (Signed)
I have checked the current med list and she does not seem to be taking any medications that may cause hypotension. She will need emergent evaluation at ED.

## 2017-07-15 NOTE — Telephone Encounter (Signed)
[name] Called to let PA Lily KocherGomez that Catherine Butler she is been having a low bp per several day when she stand up it drop more and dizziness 90/60 seating  80's stand up and she has an appointment 07-18-17 @11am  here

## 2017-07-18 ENCOUNTER — Encounter (INDEPENDENT_AMBULATORY_CARE_PROVIDER_SITE_OTHER): Payer: Self-pay | Admitting: Physician Assistant

## 2017-07-18 ENCOUNTER — Emergency Department (HOSPITAL_COMMUNITY): Payer: Medicaid Other

## 2017-07-18 ENCOUNTER — Encounter (HOSPITAL_COMMUNITY): Payer: Self-pay | Admitting: Emergency Medicine

## 2017-07-18 ENCOUNTER — Ambulatory Visit (INDEPENDENT_AMBULATORY_CARE_PROVIDER_SITE_OTHER): Payer: Medicaid Other | Admitting: Physician Assistant

## 2017-07-18 ENCOUNTER — Inpatient Hospital Stay (HOSPITAL_COMMUNITY)
Admission: EM | Admit: 2017-07-18 | Discharge: 2017-07-21 | DRG: 391 | Disposition: A | Discharge status: Home Health | Payer: Medicaid Other | Attending: Internal Medicine | Admitting: Internal Medicine

## 2017-07-18 VITALS — BP 87/65 | HR 121 | Temp 97.3°F | Resp 20 | Ht 66.0 in | Wt 121.0 lb

## 2017-07-18 DIAGNOSIS — Z8249 Family history of ischemic heart disease and other diseases of the circulatory system: Secondary | ICD-10-CM

## 2017-07-18 DIAGNOSIS — T68XXXA Hypothermia, initial encounter: Secondary | ICD-10-CM

## 2017-07-18 DIAGNOSIS — R112 Nausea with vomiting, unspecified: Secondary | ICD-10-CM

## 2017-07-18 DIAGNOSIS — G610 Guillain-Barre syndrome: Secondary | ICD-10-CM

## 2017-07-18 DIAGNOSIS — I959 Hypotension, unspecified: Secondary | ICD-10-CM | POA: Diagnosis present

## 2017-07-18 DIAGNOSIS — M1990 Unspecified osteoarthritis, unspecified site: Secondary | ICD-10-CM | POA: Diagnosis not present

## 2017-07-18 DIAGNOSIS — Z8669 Personal history of other diseases of the nervous system and sense organs: Secondary | ICD-10-CM

## 2017-07-18 DIAGNOSIS — R000 Tachycardia, unspecified: Secondary | ICD-10-CM | POA: Diagnosis present

## 2017-07-18 DIAGNOSIS — K639 Disease of intestine, unspecified: Secondary | ICD-10-CM | POA: Diagnosis not present

## 2017-07-18 DIAGNOSIS — F1010 Alcohol abuse, uncomplicated: Secondary | ICD-10-CM | POA: Diagnosis present

## 2017-07-18 DIAGNOSIS — G8250 Quadriplegia, unspecified: Secondary | ICD-10-CM | POA: Diagnosis present

## 2017-07-18 DIAGNOSIS — Z87898 Personal history of other specified conditions: Secondary | ICD-10-CM | POA: Diagnosis not present

## 2017-07-18 DIAGNOSIS — F419 Anxiety disorder, unspecified: Secondary | ICD-10-CM | POA: Diagnosis not present

## 2017-07-18 DIAGNOSIS — E871 Hypo-osmolality and hyponatremia: Secondary | ICD-10-CM | POA: Diagnosis present

## 2017-07-18 DIAGNOSIS — E876 Hypokalemia: Secondary | ICD-10-CM | POA: Diagnosis present

## 2017-07-18 DIAGNOSIS — K8681 Exocrine pancreatic insufficiency: Secondary | ICD-10-CM | POA: Diagnosis present

## 2017-07-18 DIAGNOSIS — F411 Generalized anxiety disorder: Secondary | ICD-10-CM | POA: Diagnosis present

## 2017-07-18 DIAGNOSIS — J42 Unspecified chronic bronchitis: Secondary | ICD-10-CM | POA: Diagnosis not present

## 2017-07-18 DIAGNOSIS — J449 Chronic obstructive pulmonary disease, unspecified: Secondary | ICD-10-CM | POA: Diagnosis present

## 2017-07-18 DIAGNOSIS — F329 Major depressive disorder, single episode, unspecified: Secondary | ICD-10-CM | POA: Diagnosis present

## 2017-07-18 DIAGNOSIS — F17210 Nicotine dependence, cigarettes, uncomplicated: Secondary | ICD-10-CM | POA: Diagnosis present

## 2017-07-18 DIAGNOSIS — N390 Urinary tract infection, site not specified: Secondary | ICD-10-CM | POA: Diagnosis present

## 2017-07-18 DIAGNOSIS — Z79899 Other long term (current) drug therapy: Secondary | ICD-10-CM

## 2017-07-18 DIAGNOSIS — R5381 Other malaise: Secondary | ICD-10-CM | POA: Diagnosis present

## 2017-07-18 DIAGNOSIS — E43 Unspecified severe protein-calorie malnutrition: Secondary | ICD-10-CM | POA: Diagnosis present

## 2017-07-18 DIAGNOSIS — Z681 Body mass index (BMI) 19 or less, adult: Secondary | ICD-10-CM | POA: Diagnosis not present

## 2017-07-18 DIAGNOSIS — Z915 Personal history of self-harm: Secondary | ICD-10-CM

## 2017-07-18 DIAGNOSIS — K219 Gastro-esophageal reflux disease without esophagitis: Secondary | ICD-10-CM | POA: Diagnosis present

## 2017-07-18 DIAGNOSIS — R634 Abnormal weight loss: Secondary | ICD-10-CM

## 2017-07-18 DIAGNOSIS — K7010 Alcoholic hepatitis without ascites: Secondary | ICD-10-CM | POA: Diagnosis present

## 2017-07-18 DIAGNOSIS — K5900 Constipation, unspecified: Secondary | ICD-10-CM | POA: Diagnosis present

## 2017-07-18 DIAGNOSIS — R933 Abnormal findings on diagnostic imaging of other parts of digestive tract: Secondary | ICD-10-CM | POA: Diagnosis not present

## 2017-07-18 DIAGNOSIS — Z825 Family history of asthma and other chronic lower respiratory diseases: Secondary | ICD-10-CM

## 2017-07-18 DIAGNOSIS — Z9181 History of falling: Secondary | ICD-10-CM | POA: Diagnosis not present

## 2017-07-18 DIAGNOSIS — Z79891 Long term (current) use of opiate analgesic: Secondary | ICD-10-CM | POA: Diagnosis not present

## 2017-07-18 DIAGNOSIS — K6389 Other specified diseases of intestine: Secondary | ICD-10-CM

## 2017-07-18 DIAGNOSIS — E860 Dehydration: Secondary | ICD-10-CM

## 2017-07-18 DIAGNOSIS — Z716 Tobacco abuse counseling: Secondary | ICD-10-CM

## 2017-07-18 DIAGNOSIS — K760 Fatty (change of) liver, not elsewhere classified: Secondary | ICD-10-CM | POA: Diagnosis present

## 2017-07-18 DIAGNOSIS — F32A Depression, unspecified: Secondary | ICD-10-CM | POA: Diagnosis present

## 2017-07-18 DIAGNOSIS — K5909 Other constipation: Secondary | ICD-10-CM | POA: Diagnosis present

## 2017-07-18 DIAGNOSIS — R1084 Generalized abdominal pain: Secondary | ICD-10-CM

## 2017-07-18 DIAGNOSIS — R34 Anuria and oliguria: Secondary | ICD-10-CM

## 2017-07-18 DIAGNOSIS — K861 Other chronic pancreatitis: Secondary | ICD-10-CM | POA: Diagnosis present

## 2017-07-18 DIAGNOSIS — R339 Retention of urine, unspecified: Secondary | ICD-10-CM | POA: Diagnosis not present

## 2017-07-18 DIAGNOSIS — Z888 Allergy status to other drugs, medicaments and biological substances status: Secondary | ICD-10-CM

## 2017-07-18 DIAGNOSIS — I10 Essential (primary) hypertension: Secondary | ICD-10-CM | POA: Diagnosis present

## 2017-07-18 HISTORY — DX: Other complications of anesthesia, initial encounter: T88.59XA

## 2017-07-18 HISTORY — DX: Other specified postprocedural states: Z98.890

## 2017-07-18 HISTORY — DX: Adverse effect of unspecified anesthetic, initial encounter: T41.45XA

## 2017-07-18 HISTORY — DX: Other specified postprocedural states: R11.2

## 2017-07-18 LAB — URINALYSIS, ROUTINE W REFLEX MICROSCOPIC
BILIRUBIN URINE: NEGATIVE
Glucose, UA: NEGATIVE mg/dL
Hgb urine dipstick: NEGATIVE
KETONES UR: NEGATIVE mg/dL
NITRITE: POSITIVE — AB
PH: 6 (ref 5.0–8.0)
PROTEIN: NEGATIVE mg/dL
Specific Gravity, Urine: >1.046 — ABNORMAL HIGH (ref 1.005–1.030)

## 2017-07-18 LAB — COMPREHENSIVE METABOLIC PANEL
ALBUMIN: 2.9 g/dL — AB (ref 3.5–5.0)
ALT: 20 U/L (ref 14–54)
ANION GAP: 12 (ref 5–15)
AST: 33 U/L (ref 15–41)
Alkaline Phosphatase: 120 U/L (ref 38–126)
BUN: 6 mg/dL (ref 6–20)
CHLORIDE: 96 mmol/L — AB (ref 101–111)
CO2: 24 mmol/L (ref 22–32)
Calcium: 8.6 mg/dL — ABNORMAL LOW (ref 8.9–10.3)
Creatinine, Ser: 0.66 mg/dL (ref 0.44–1.00)
GFR calc Af Amer: >60 mL/min (ref >60)
GFR calc non Af Amer: >60 mL/min (ref >60)
Glucose, Bld: 132 mg/dL — ABNORMAL HIGH (ref 65–99)
POTASSIUM: 3.1 mmol/L — AB (ref 3.5–5.1)
Sodium: 132 mmol/L — ABNORMAL LOW (ref 135–145)
Total Bilirubin: 0.9 mg/dL (ref 0.3–1.2)
Total Protein: 6.8 g/dL (ref 6.5–8.1)

## 2017-07-18 LAB — CBC
HEMATOCRIT: 38.9 % (ref 36.0–46.0)
HEMOGLOBIN: 13.4 g/dL (ref 12.0–15.0)
MCH: 30.2 pg (ref 26.0–34.0)
MCHC: 34.4 g/dL (ref 30.0–36.0)
MCV: 87.8 fL (ref 78.0–100.0)
Platelets: 285 10*3/uL (ref 150–400)
RBC: 4.43 MIL/uL (ref 3.87–5.11)
RDW: 15.2 % (ref 11.5–15.5)
WBC: 5.9 10*3/uL (ref 4.0–10.5)

## 2017-07-18 LAB — MAGNESIUM: Magnesium: 1 mg/dL — ABNORMAL LOW (ref 1.7–2.4)

## 2017-07-18 LAB — I-STAT CG4 LACTIC ACID, ED: LACTIC ACID, VENOUS: 0.97 mmol/L (ref 0.5–1.9)

## 2017-07-18 LAB — I-STAT BETA HCG BLOOD, ED (MC, WL, AP ONLY): HCG, QUANTITATIVE: 6.5 m[IU]/mL — AB (ref <5)

## 2017-07-18 LAB — LIPASE, BLOOD: LIPASE: 33 U/L (ref 11–51)

## 2017-07-18 MED ORDER — PROCHLORPERAZINE EDISYLATE 5 MG/ML IJ SOLN
10.0000 mg | Freq: Four times a day (QID) | INTRAMUSCULAR | Status: DC | PRN
  Administered 2017-07-18 – 2017-07-19 (×2): 10 mg via INTRAVENOUS
  Filled 2017-07-18 (×2): qty 2

## 2017-07-18 MED ORDER — ALBUTEROL SULFATE HFA 108 (90 BASE) MCG/ACT IN AERS: 2.0000 | INHALATION_SPRAY | Freq: Four times a day (QID) | RESPIRATORY_TRACT | Status: DC | PRN

## 2017-07-18 MED ORDER — ONDANSETRON HCL 4 MG PO TABS: 4.0000 mg | ORAL_TABLET | Freq: Four times a day (QID) | ORAL | Status: DC | PRN

## 2017-07-18 MED ORDER — SODIUM CHLORIDE 0.9 % IV BOLUS (SEPSIS)
2000.0000 mL | Freq: Once | INTRAVENOUS | Status: AC
  Administered 2017-07-18: 2000 mL via INTRAVENOUS

## 2017-07-18 MED ORDER — POTASSIUM CHLORIDE CRYS ER 20 MEQ PO TBCR
20.0000 meq | EXTENDED_RELEASE_TABLET | Freq: Every day | ORAL | Status: DC
  Administered 2017-07-19 – 2017-07-21 (×3): 20 meq via ORAL
  Filled 2017-07-18 (×3): qty 1

## 2017-07-18 MED ORDER — POTASSIUM CHLORIDE 10 MEQ/100ML IV SOLN
10.0000 meq | INTRAVENOUS | Status: DC
  Administered 2017-07-18: 10 meq via INTRAVENOUS
  Filled 2017-07-18: qty 100

## 2017-07-18 MED ORDER — FENTANYL CITRATE (PF) 100 MCG/2ML IJ SOLN
50.0000 ug | Freq: Once | INTRAMUSCULAR | Status: AC
Start: 2017-07-18 — End: 2017-07-18
  Administered 2017-07-18: 50 ug via INTRAVENOUS
  Filled 2017-07-18: qty 2

## 2017-07-18 MED ORDER — BISACODYL 5 MG PO TBEC
10.0000 mg | DELAYED_RELEASE_TABLET | Freq: Once | ORAL | Status: AC
  Administered 2017-07-18: 10 mg via ORAL
  Filled 2017-07-18: qty 2

## 2017-07-18 MED ORDER — GABAPENTIN 400 MG PO CAPS
800.0000 mg | ORAL_CAPSULE | Freq: Three times a day (TID) | ORAL | Status: DC
  Administered 2017-07-18 – 2017-07-21 (×8): 800 mg via ORAL
  Filled 2017-07-18 (×8): qty 2

## 2017-07-18 MED ORDER — LEVALBUTEROL HCL 0.63 MG/3ML IN NEBU: 0.6300 mg | INHALATION_SOLUTION | RESPIRATORY_TRACT | Status: DC | PRN

## 2017-07-18 MED ORDER — FLEET ENEMA 7-19 GM/118ML RE ENEM
1.0000 | ENEMA | Freq: Once | RECTAL | Status: DC
  Filled 2017-07-18: qty 1

## 2017-07-18 MED ORDER — BOOST / RESOURCE BREEZE PO LIQD CUSTOM
1.0000 | Freq: Three times a day (TID) | ORAL | Status: DC
  Administered 2017-07-20: 1 via ORAL

## 2017-07-18 MED ORDER — SODIUM CHLORIDE 0.9% FLUSH: 3.0000 mL | Freq: Two times a day (BID) | INTRAVENOUS | Status: DC

## 2017-07-18 MED ORDER — IOPAMIDOL (ISOVUE-300) INJECTION 61%
100.0000 mL | Freq: Once | INTRAVENOUS | Status: AC | PRN
  Administered 2017-07-18: 100 mL via INTRAVENOUS

## 2017-07-18 MED ORDER — ENOXAPARIN SODIUM 40 MG/0.4ML ~~LOC~~ SOLN
40.0000 mg | SUBCUTANEOUS | Status: DC
  Administered 2017-07-18 – 2017-07-19 (×2): 40 mg via SUBCUTANEOUS
  Filled 2017-07-18 (×2): qty 0.4

## 2017-07-18 MED ORDER — METHOCARBAMOL 500 MG PO TABS
500.0000 mg | ORAL_TABLET | Freq: Four times a day (QID) | ORAL | Status: DC | PRN
  Administered 2017-07-18 – 2017-07-20 (×3): 500 mg via ORAL
  Filled 2017-07-18 (×3): qty 1

## 2017-07-18 MED ORDER — MAGNESIUM SULFATE 4 GM/100ML IV SOLN
4.0000 g | Freq: Once | INTRAVENOUS | Status: AC
  Administered 2017-07-18: 4 g via INTRAVENOUS
  Filled 2017-07-18: qty 100

## 2017-07-18 MED ORDER — ACETAMINOPHEN 650 MG RE SUPP: 650.0000 mg | Freq: Four times a day (QID) | RECTAL | Status: DC | PRN

## 2017-07-18 MED ORDER — MIRTAZAPINE 15 MG PO TABS
15.0000 mg | ORAL_TABLET | Freq: Every day | ORAL | Status: DC
  Administered 2017-07-18 – 2017-07-20 (×3): 15 mg via ORAL
  Filled 2017-07-18 (×3): qty 1

## 2017-07-18 MED ORDER — IOPAMIDOL (ISOVUE-300) INJECTION 61%
INTRAVENOUS | Status: AC
  Administered 2017-07-18: 100 mL via INTRAVENOUS
  Filled 2017-07-18: qty 100

## 2017-07-18 MED ORDER — TRAZODONE HCL 50 MG PO TABS
25.0000 mg | ORAL_TABLET | Freq: Every evening | ORAL | Status: DC | PRN
  Administered 2017-07-18: 25 mg via ORAL
  Filled 2017-07-18: qty 1

## 2017-07-18 MED ORDER — TRAMADOL HCL 50 MG PO TABS
50.0000 mg | ORAL_TABLET | Freq: Four times a day (QID) | ORAL | Status: DC | PRN
  Administered 2017-07-18: 50 mg via ORAL
  Filled 2017-07-18: qty 1

## 2017-07-18 MED ORDER — LEVALBUTEROL HCL 0.63 MG/3ML IN NEBU
0.6300 mg | INHALATION_SOLUTION | Freq: Three times a day (TID) | RESPIRATORY_TRACT | Status: DC
  Administered 2017-07-18: 0.63 mg via RESPIRATORY_TRACT
  Filled 2017-07-18: qty 3

## 2017-07-18 MED ORDER — ACETAMINOPHEN 325 MG PO TABS: 650.0000 mg | ORAL_TABLET | Freq: Four times a day (QID) | ORAL | Status: DC | PRN

## 2017-07-18 MED ORDER — ONDANSETRON HCL 4 MG/2ML IJ SOLN
4.0000 mg | Freq: Four times a day (QID) | INTRAMUSCULAR | Status: DC | PRN
  Administered 2017-07-18 – 2017-07-19 (×2): 4 mg via INTRAVENOUS
  Filled 2017-07-18 (×2): qty 2

## 2017-07-18 MED ORDER — POTASSIUM CHLORIDE CRYS ER 20 MEQ PO TBCR
40.0000 meq | EXTENDED_RELEASE_TABLET | Freq: Once | ORAL | Status: AC
  Administered 2017-07-18: 40 meq via ORAL
  Filled 2017-07-18: qty 2

## 2017-07-18 MED ORDER — NICOTINE 21 MG/24HR TD PT24
21.0000 mg | MEDICATED_PATCH | Freq: Every day | TRANSDERMAL | Status: DC
  Filled 2017-07-18 (×2): qty 1

## 2017-07-18 MED ORDER — POTASSIUM CHLORIDE 10 MEQ/100ML IV SOLN
10.0000 meq | INTRAVENOUS | Status: AC
  Administered 2017-07-18: 10 meq via INTRAVENOUS
  Filled 2017-07-18 (×2): qty 100

## 2017-07-18 MED ORDER — IPRATROPIUM BROMIDE 0.02 % IN SOLN: 0.5000 mg | RESPIRATORY_TRACT | Status: DC | PRN

## 2017-07-18 MED ORDER — FLEET ENEMA 7-19 GM/118ML RE ENEM: 1.0000 | ENEMA | Freq: Once | RECTAL | Status: DC

## 2017-07-18 MED ORDER — ONDANSETRON 4 MG PO TBDP
4.0000 mg | ORAL_TABLET | Freq: Once | ORAL | Status: AC
  Administered 2017-07-18: 4 mg via ORAL

## 2017-07-18 MED ORDER — SODIUM CHLORIDE 0.9 % IV SOLN
INTRAVENOUS | Status: DC
Start: 2017-07-18 — End: 2017-07-21
  Administered 2017-07-18 – 2017-07-21 (×9): via INTRAVENOUS

## 2017-07-18 MED ORDER — ACETAMINOPHEN 325 MG PO TABS: 650.0000 mg | ORAL_TABLET | ORAL | Status: DC | PRN

## 2017-07-18 MED ORDER — KETOROLAC TROMETHAMINE 30 MG/ML IJ SOLN
30.0000 mg | Freq: Four times a day (QID) | INTRAMUSCULAR | Status: DC | PRN
Start: 2017-07-18 — End: 2017-07-21
  Administered 2017-07-18 – 2017-07-21 (×6): 30 mg via INTRAVENOUS
  Filled 2017-07-18 (×6): qty 1

## 2017-07-18 MED ORDER — POTASSIUM CHLORIDE 10 MEQ/100ML IV SOLN
10.0000 meq | INTRAVENOUS | Status: AC
Start: 2017-07-18 — End: 2017-07-18
  Administered 2017-07-18 (×2): 10 meq via INTRAVENOUS
  Filled 2017-07-18 (×3): qty 100

## 2017-07-18 NOTE — Consult Note (Signed)
[facility] Consult: 4:06 PM 07/18/2017  LOS: 0 days    Referring Provider: Dr Freida Busman in ED  Primary Care Physician:  Loletta Specter, PA-C Primary Gastroenterologist:  unassigned    Reason for Consultation:  Colon mass??   HPI: Catherine Butler is a 57 y.o. female.  Hx COPD.  Htn.  ETOh abuse.  Depression.   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  Unfortunately near quad afterwards and chronic leg pain.  Urinary retention.  Elevated LFTs with fatty liver per CT of 12/2016. LFTs c/w ETOH hepatitis in 03/2017.  HH and marked fatty liver, ? Chronic calcific pancreatitis per CT of 03/2017.  T11 endplate fracture.     Inpt admission 06/20/17 - 06/23/17 with N/V. ? Viral GI illness vs food poisoning. CT of 06/20/17 with fatty liver, HH, pancreatic calcifications and fecal retention.  Discharged to [facility] rehab,  Stayed 1/3 - 07/05/16. 06/23/17 urine grew E coli.  At discharge was ambulating up to 30 feet with rest breaks.     Patient has a long-standing history of constipation and only moves her bowels every 10-14 days.  She says that around 20 years ago she underwent colonoscopy which was unremarkable.  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  She periodically has abdominal pain.  Her appetite is generally poor.  She reports 60 pound weight loss since October 2018. For the last 7-10 days she has had hypotension and gets dizzy when she tries to get up.  She is been too dizzy and weak to ambulate.  She is also noticed her heart rate getting up to the 120s.  This morning she developed nausea and had episodes of nonbloody, bilious emesis.  She complains of pain which is nonfocal but emanates from the mid abdomen.  In the ED her  heart rate is in the 120s.  Blood pressure on arrival 82/61. CT repeat 1/28:Significant stool burden proximal to a relative narrowing in the mid descending colon at the level of the left iliac crest, raising ? Of mass.  No other new changes.   Albumin low but o/w normal LFTs.  Na and potassium low.  Hgb 13.4, c/w ~ 10.5 2 weeks ago.   coags normal as recently as 06/20/17.      Last consumed alcohol prior to her 03/2017 admission.  Living at her parents home.  3 of colon cancer.  Past Medical History:  Diagnosis Date  . Anxiety   . Arthritis   . Asthma   . COPD (chronic obstructive pulmonary disease) (HCC)   . Depression   . H/O alcohol abuse   . Hypertension   . Reflux   . Suicide attempt by multiple drug overdose (HCC) 2014    Past Surgical History:  Procedure Laterality Date  . LAPAROSCOPY    . laporscopy surgery for ovarian cyst  20 years ago  . TONSILLECTOMY      Prior to Admission medications   Medication Sig Start  Date End Date Taking? Authorizing Provider  acetaminophen (TYLENOL) 500 MG tablet Take 1,000 mg by mouth 2 (two) times daily as needed for headache.   Yes [provider]  albuterol (PROVENTIL HFA;VENTOLIN HFA) 108 (90 Base) MCG/ACT inhaler Inhale 2 puffs into the lungs every 6 (six) hours as needed for wheezing. 06/18/15  Yes Standley Brooking, MD  gabapentin (NEURONTIN) 400 MG capsule Take 2 capsules (800 mg total) by mouth 3 (three) times daily. 07/06/17  Yes Love, Evlyn Kanner, PA-C  methocarbamol (ROBAXIN) 500 MG tablet Take 1 tablet (500 mg total) by mouth every 6 (six) hours as needed for muscle spasms. 07/06/17  Yes Love, Evlyn Kanner, PA-C  mirtazapine (REMERON) 15 MG tablet Take 15 mg by mouth at bedtime.   Yes [provider]  polyethylene glycol (MIRALAX / GLYCOLAX) packet Take 17 g by mouth 2 (two) times daily. Patient taking differently: Take 17 g by mouth daily as needed for mild constipation.  07/06/17  Yes Love, Evlyn Kanner, PA-C  QUEtiapine  (SEROQUEL) 400 MG tablet Take 400 mg by mouth at bedtime.   Yes [provider]  senna-docusate (SENOKOT-S) 8.6-50 MG tablet Take 2 tablets by mouth 2 (two) times daily. Patient taking differently: Take 2 tablets by mouth 2 (two) times daily as needed for mild constipation.  07/06/17  Yes Love, Evlyn Kanner, PA-C  potassium chloride SA (K-DUR,KLOR-CON) 20 MEQ tablet Take 1 tablet (20 mEq total) by mouth daily. Patient not taking: Reported on 07/18/2017 07/06/17   Love, Evlyn Kanner, PA-C  traMADol (ULTRAM) 50 MG tablet Take 1 tablet (50 mg total) by mouth every 6 (six) hours as needed for moderate pain. Patient not taking: Reported on 07/18/2017 07/06/17   Jacquelynn Cree, PA-C    Scheduled Meds:  Infusions: . sodium chloride 125 mL/hr at 07/18/17 1358   PRN Meds:    Allergies as of 07/18/2017 - Review Complete 07/18/2017  Allergen Reaction Noted  . Lipitor [atorvastatin] Other (See Comments) 04/11/2017    Family History  Problem Relation Age of Onset  . Diabetes Father   . Hypertension Father 50       heart attack  . Breast cancer Mother 30       lump removed, bile duct cancer  . Hypertension Brother   . Allergic rhinitis Paternal Grandfather     Social History   Socioeconomic History  . Marital status: Single    Spouse name: Not on file  . Number of children: Not on file  . Years of education: Not on file  . Highest education level: Not on file  Social Needs  . Financial resource strain: Not on file  . Food insecurity - worry: Not on file  . Food insecurity - inability: Not on file  . Transportation needs - medical: Not on file  . Transportation needs - non-medical: Not on file  Occupational History  . Occupation: unemployed  Tobacco Use  . Smoking status: Current Every Day Smoker    Packs/day: 1.00    Years: 30.00    Pack years: 30.00    Types: Cigarettes  . Smokeless tobacco: Never Used  . Tobacco comment: states "might be interested in the smoking cessation  program"  Pt given counseling related to smoking cessation  Substance and Sexual Activity  . Alcohol use: Yes    Comment: couple x a week  . Drug use: No  . Sexual activity: Not Currently  Other Topics Concern  . Not on file  Social  History Narrative   ** Merged History Encounter **        REVIEW OF SYSTEMS: Constitutional: Weakness, dizziness. ENT:  No nose bleeds Pulm: No difficulty breathing.  No productive cough. CV: Tachycardia, no chest pain. GU:  No hematuria, no frequency GI:  Per HPI Heme: Denies unusual bleeding or bruising. Transfusions: No recall of previous blood transfusions. Neuro:  No headaches, no peripheral tingling or numbness Derm:  No itching, no rash or sores.  Endocrine:  No sweats or chills.  No polyuria or dysuria Immunization: Reviewed records.  I do not see a current flu vaccination but she was vaccinated for pneumococcal in 05/2015 and 12/2016 Travel:  None beyond local counties in last few months.    PHYSICAL EXAM: Vital signs in last 24 hours: Vitals:   07/18/17 1552 07/18/17 1554  BP: 115/84 93/79  Pulse: (!) 112 (!) 122  Resp: 16   Temp:    SpO2: 98%    Wt Readings from Last 3 Encounters:  07/18/17 54.9 kg (121 lb)  07/18/17 54.9 kg (121 lb)  07/06/17 62.2 kg (137 lb 0.4 oz)    General: Pale, chronically unwell looking, slightly uncomfortable WF. Head: No facial asymmetry or swelling.  No signs of head trauma. Eyes: No conjunctival pallor.  No scleral icterus. Ears: Not hard of hearing Nose: No discharge or congestion Mouth: Tongue midline.  Oral mucosa moist, pink, clear. Neck: No JVD, thyromegaly or bruits. Lungs: Clear bilaterally.  No labored breathing.  No cough. Heart: Tachycardic, regular.  No MRG.  S1, S2 present. Abdomen: Soft.  Not distended.  Some fullness in the lower abdomen but no distinct masses.  No HSM.  Tenderness is mild and diffuse throughout abdomen.   Rectal: No palpable masses, no palpable stool.  A suspect  of beige colored stool tests FOBT negative.  Good rectal tone. Musc/Skeltl: No joint redness, swelling or significant deformities or contractures. Extremities: No CCE.  The musculature of the legs looks diminished. Neurologic: The patient is alert.  She is oriented x3.  She is able to move all 4 limbs, limb strength was not tested.  No tremors. Skin: Pale.  No rashes, sores, telangiectasia. Nodes: No cervical or inguinal adenopathy. Psych: Affect flat, seems depressed but she is conversant, provides good history and cooperative.  Intake/Output from previous day: No intake/output data recorded. Intake/Output this shift: Total I/O In: 2000 [IV Piggyback:2000] Out: -   LAB RESULTS: Recent Labs    07/18/17 1242  WBC 5.9  HGB 13.4  HCT 38.9  PLT 285   BMET Lab Results  Component Value Date   NA 132 (L) 07/18/2017   NA 136 07/06/2017   NA 133 (L) 07/05/2017   K 3.1 (L) 07/18/2017   K 4.0 07/06/2017   K 3.3 (L) 07/05/2017   CL 96 (L) 07/18/2017   CL 106 07/06/2017   CL 99 (L) 07/05/2017   CO2 24 07/18/2017   CO2 18 (L) 07/06/2017   CO2 21 (L) 07/05/2017   GLUCOSE 132 (H) 07/18/2017   GLUCOSE 142 (H) 07/06/2017   GLUCOSE 120 (H) 07/05/2017   BUN 6 07/18/2017   BUN 10 07/06/2017   BUN 12 07/05/2017   CREATININE 0.66 07/18/2017   CREATININE 0.56 07/06/2017   CREATININE 0.56 07/05/2017   CALCIUM 8.6 (L) 07/18/2017   CALCIUM 8.8 (L) 07/06/2017   CALCIUM 8.4 (L) 07/05/2017   LFT Recent Labs    07/18/17 1242  PROT 6.8  ALBUMIN 2.9*  AST 33  ALT 20  ALKPHOS 120  BILITOT 0.9   PT/INR Lab Results  Component Value Date   INR 0.93 06/20/2017   INR 1.00 01/02/2017   INR 0.90 03/16/2012   Hepatitis Panel No results for input(s): HEPBSAG, HCVAB, HEPAIGM, HEPBIGM in the last 72 hours. C-Diff No components found for: CDIFF Lipase     Component Value Date/Time   LIPASE 33 07/18/2017 1242    Drugs of Abuse     Component Value Date/Time   LABOPIA NONE DETECTED  04/12/2017 1539   COCAINSCRNUR NONE DETECTED 04/12/2017 1539   LABBENZ NONE DETECTED 04/12/2017 1539   AMPHETMU NONE DETECTED 04/12/2017 1539   THCU NONE DETECTED 04/12/2017 1539   LABBARB NONE DETECTED 04/12/2017 1539     RADIOLOGY STUDIES: Ct Abdomen Pelvis W Contrast  Result Date: 07/18/2017 CLINICAL DATA:  Patient reports nausea, intractable vomiting since this morning. Generalized abdominal pain. Oliguria. Weight loss. History of quadriplegia, quadrant paresis. GBS. EXAM: CT ABDOMEN AND PELVIS WITH CONTRAST TECHNIQUE: Multidetector CT imaging of the abdomen and pelvis was performed using the standard protocol following bolus administration of intravenous contrast. CONTRAST:  100 cm3 Isovue-300 COMPARISON:  Plain film 07/05/2017, CT 06/20/2017 FINDINGS: Lower chest: There are dependent changes versus scarring in the lung bases, left greater than right. Moderate hiatal hernia. Hepatobiliary: Diffuse low-attenuation of the liver without focal lesion. Gallbladder is present. Pancreas: Pancreas appears atrophic. Pancreatic calcifications are consistent with history of pancreatitis. No focal pancreatic lesions. Spleen: Normal in size without focal abnormality. Adrenals/Urinary Tract: Adrenal glands are unremarkable. Kidneys are normal, without renal calculi, focal lesion, or hydronephrosis. Bladder is unremarkable. Stomach/Bowel: Large hiatal hernia. Stomach and small bowel loops are otherwise normal in appearance. There is a large amount of stool within mildly distended large bowel loops. There is question of a relative transition zone in the level of the mid descending colon at the level of the left iliac crest on coronal image number 48 of series 4, axial image 56 of series 2. Distal to this point, there is less distension, left stool, raising the question of a stricture, possibly a malignant lesion. Vascular/Lymphatic: There is atherosclerotic calcification of the abdominal aorta not associated with  aneurysm. No retroperitoneal or mesenteric adenopathy. Reproductive: Uterus is present. No adnexal mass. No free pelvic fluid. Other: None Musculoskeletal: Superior endplate fracture of T11 appears stable. No acute osseous abnormality. IMPRESSION: 1. Significant stool burden proximal to a relative narrowing in the mid descending colon at the level of the left iliac crest. This raises a question of mass in this region. Consider colonoscopy. 2. Hepatic steatosis. 3. Changes compatible with chronic pancreatitis. 4. Large hiatal hernia. 5.  Aortic atherosclerosis.  (ICD10-I70.0) 6. Chronic superior endplate fracture of T11. Electronically Signed   By: Norva Pavlov M.D.   On: 07/18/2017 15:28     IMPRESSION:   *  Nausea vomiting. Non-bloody, due to colonic obstruction?  *  Fecal retention raising ? Of colon mass in mid descending colon.   Many years of chronic constipation.  Appears to have worsened since her diagnosis of GBS which may be attributed to neuropathy but need to rule out obstructing lesion.  Her 60 pound weight loss is likely multifactorial. S/p remote, apparently unremarkable colonoscopy when the patient was in her 30s  *  Pancreatic calcifications, likely chronic pancreatitis.  *  Fatty liver, hx what looks like ETOH hepatitis by AST/ALT in past.  Hx ETOH abuse.    *  Hypokalemia.  Hyponatremia.  *  Hx  GBS 03/2017.      PLAN:     *  Dr. Christella Hartigan will see the patient in the morning.  Suspect she is going to need colonoscopy but before we can proceed, need to resolve the nausea vomiting in order to prep for the study.  *   Fleets enema x 2 and po dulclax in attempt to clear stool.  *   Clear liquids as tolerated.   Catherine Butler  07/18/2017, 4:06 PM Pager: [phone]  ________________________________________________________________________  [facility] MD note:  I personally examined the patient, reviewed the data and agree with the assessment and plan described above.   Sent to hosp with hypotension, dizziness.  Has had vomiting/abd pain for 1-2 days.  No change in her chronically constipated bowels.  CT suggests narrowing in descending segment, unclear etiology.  She is not obstructed clinically or by imaging but I do see a focal narrowing in her descending. This may be spasm related.  She will need colonoscopy when her UGI symptoms resolve; still nausea/vomiting overnight a bit. OK for clears to day, will check back tomorrow.   Catherine Bunting, MD [facility] Pager [phone]

## 2017-07-18 NOTE — ED Notes (Signed)
ED TO INPATIENT HANDOFF REPORT  Name/Age/Gender Catherine Butler 57 y.o. female  Code Status Code Status History    Date Active Date Inactive Code Status Order ID Comments User Context   06/23/2017 15:20 07/06/2017 14:23 Full Code [id number]  Flora Lipps Inpatient   06/21/2017 01:24 06/23/2017 15:16 Full Code [id number]  Roney Jaffe, MD Inpatient   04/11/2017 17:57 04/20/2017 18:03 Full Code [id number]  Merton Border, MD ED   01/01/2017 21:01 01/03/2017 18:25 Full Code [id number]  Ivor Costa, MD ED   06/16/2015 19:34 06/18/2015 20:38 Full Code [id number]  Doree Albee, MD ED   02/08/2013 11:42 02/09/2013 14:56 Full Code [id number]  Hyman Bible, PA-C ED   09/13/2012 02:22 09/13/2012 10:48 Full Code [id number]  Ezequiel Essex, MD ED      Home/SNF/Other Home  Chief Complaint emesis/abdmonal pain/low blood pressure  Level of Care/Admitting Diagnosis ED Disposition    ED Disposition Condition [facility] Area: [facility] [[id number]]  Level of Care: Telemetry [5]  Admit to tele based on following criteria: Monitor QTC interval  Admit to tele based on following criteria: Complex arrhythmia (Bradycardia/Tachycardia)  Diagnosis: Colonic mass [465681]  Admitting Physician: Eugenie Filler [[id number]]  Attending Physician: Eugenie Filler [[id number]]  Estimated length of stay: past midnight tomorrow  Certification:: I certify this patient will need inpatient services for at least 2 midnights  PT Class (Do Not Modify): Inpatient [101]  PT Acc Code (Do Not Modify): Private [1]       Medical History Past Medical History:  Diagnosis Date  . Anxiety   . Arthritis   . Asthma   . COPD (chronic obstructive pulmonary disease) ([place])   . Depression   . H/O alcohol abuse   . Hypertension   . Reflux   . Suicide attempt by multiple drug overdose ([place]) 2014    Allergies Allergies  Allergen Reactions  . Lipitor [Atorvastatin] Other (See Comments)    "really  bad leg pain"    IV Location/Drains/Wounds Patient Lines/Drains/Airways Status   Active Line/Drains/Airways    Name:   Placement date:   Placement time:   Site:   Days:   Peripheral IV 07/18/17 Right Forearm   07/18/17    1241    Forearm   less than 1   External Urinary Catheter   06/21/17    2100    -   27   Wound / Incision (Open or Dehisced) 04/13/17 Puncture Lumbar Mid LP    04/13/17    1639    Lumbar   96          Labs/Imaging Results for orders placed or performed during the hospital encounter of 07/18/17 (from the past 48 hour(s))  Lipase, blood     Status: None   Collection Time: 07/18/17 12:42 PM  Result Value Ref Range   Lipase 33 11 - 51 U/L  Comprehensive metabolic panel     Status: Abnormal   Collection Time: 07/18/17 12:42 PM  Result Value Ref Range   Sodium 132 (L) 135 - 145 mmol/L   Potassium 3.1 (L) 3.5 - 5.1 mmol/L   Chloride 96 (L) 101 - 111 mmol/L   CO2 24 22 - 32 mmol/L   Glucose, Bld 132 (H) 65 - 99 mg/dL   BUN 6 6 - 20 mg/dL   Creatinine, Ser 0.66 0.44 - 1.00 mg/dL   Calcium 8.6 (L) 8.9 - 10.3 mg/dL   Total Protein 6.8 6.5 -  8.1 g/dL   Albumin 2.9 (L) 3.5 - 5.0 g/dL   AST 33 15 - 41 U/L   ALT 20 14 - 54 U/L   Alkaline Phosphatase 120 38 - 126 U/L   Total Bilirubin 0.9 0.3 - 1.2 mg/dL   GFR calc non Af Amer >60 >60 mL/min   GFR calc Af Amer >60 >60 mL/min    Comment: (NOTE) The eGFR has been calculated using the CKD EPI equation. This calculation has not been validated in all clinical situations. eGFR's persistently <60 mL/min signify possible Chronic Kidney Disease.    Anion gap 12 5 - 15  CBC     Status: None   Collection Time: 07/18/17 12:42 PM  Result Value Ref Range   WBC 5.9 4.0 - 10.5 K/uL   RBC 4.43 3.87 - 5.11 MIL/uL   Hemoglobin 13.4 12.0 - 15.0 g/dL   HCT 38.9 36.0 - 46.0 %   MCV 87.8 78.0 - 100.0 fL   MCH 30.2 26.0 - 34.0 pg   MCHC 34.4 30.0 - 36.0 g/dL   RDW 15.2 11.5 - 15.5 %   Platelets 285 150 - 400 K/uL  Magnesium      Status: Abnormal   Collection Time: 07/18/17 12:42 PM  Result Value Ref Range   Magnesium 1.0 (L) 1.7 - 2.4 mg/dL  I-Stat beta hCG blood, ED     Status: Abnormal   Collection Time: 07/18/17 12:46 PM  Result Value Ref Range   I-stat hCG, quantitative 6.5 (H) <5 mIU/mL   Comment 3            Comment:   GEST. AGE      CONC.  (mIU/mL)   <=1 WEEK        5 - 50     2 WEEKS       50 - 500     3 WEEKS       100 - 10,000     4 WEEKS     1,000 - 30,000        FEMALE AND NON-PREGNANT FEMALE:     LESS THAN 5 mIU/mL   I-Stat CG4 Lactic Acid, ED     Status: None   Collection Time: 07/18/17  2:03 PM  Result Value Ref Range   Lactic Acid, Venous 0.97 0.5 - 1.9 mmol/L   Ct Abdomen Pelvis W Contrast  Result Date: 07/18/2017 CLINICAL DATA:  Patient reports nausea, intractable vomiting since this morning. Generalized abdominal pain. Oliguria. Weight loss. History of quadriplegia, quadrant paresis. GBS. EXAM: CT ABDOMEN AND PELVIS WITH CONTRAST TECHNIQUE: Multidetector CT imaging of the abdomen and pelvis was performed using the standard protocol following bolus administration of intravenous contrast. CONTRAST:  100 cm3 Isovue-300 COMPARISON:  Plain film 07/05/2017, CT 06/20/2017 FINDINGS: Lower chest: There are dependent changes versus scarring in the lung bases, left greater than right. Moderate hiatal hernia. Hepatobiliary: Diffuse low-attenuation of the liver without focal lesion. Gallbladder is present. Pancreas: Pancreas appears atrophic. Pancreatic calcifications are consistent with history of pancreatitis. No focal pancreatic lesions. Spleen: Normal in size without focal abnormality. Adrenals/Urinary Tract: Adrenal glands are unremarkable. Kidneys are normal, without renal calculi, focal lesion, or hydronephrosis. Bladder is unremarkable. Stomach/Bowel: Large hiatal hernia. Stomach and small bowel loops are otherwise normal in appearance. There is a large amount of stool within mildly distended large bowel  loops. There is question of a relative transition zone in the level of the mid descending colon at the level of the left  iliac crest on coronal image number 48 of series 4, axial image 56 of series 2. Distal to this point, there is less distension, left stool, raising the question of a stricture, possibly a malignant lesion. Vascular/Lymphatic: There is atherosclerotic calcification of the abdominal aorta not associated with aneurysm. No retroperitoneal or mesenteric adenopathy. Reproductive: Uterus is present. No adnexal mass. No free pelvic fluid. Other: None Musculoskeletal: Superior endplate fracture of H60 appears stable. No acute osseous abnormality. IMPRESSION: 1. Significant stool burden proximal to a relative narrowing in the mid descending colon at the level of the left iliac crest. This raises a question of mass in this region. Consider colonoscopy. 2. Hepatic steatosis. 3. Changes compatible with chronic pancreatitis. 4. Large hiatal hernia. 5.  Aortic atherosclerosis.  (ICD10-I70.0) 6. Chronic superior endplate fracture of V37. Electronically Signed   By: Nolon Nations M.D.   On: 07/18/2017 15:28    Pending Labs Unresulted Labs (From admission, onward)   Start     Ordered   07/18/17 1335  Culture, blood (Routine X 2) w Reflex to ID Panel  BLOOD CULTURE X 2,   R     07/18/17 1335   07/18/17 1230  Urinalysis, Routine w reflex microscopic  STAT,   STAT     07/18/17 1229      Vitals/Pain Today's Vitals   07/18/17 1552 07/18/17 1554 07/18/17 1600 07/18/17 1654  BP: 115/84 93/79 113/85 113/85  Pulse: (!) 112 (!) 122 (!) 119 (!) 115  Resp: _0 Temp:      TempSrc:      SpO2: 98%  99% 99%  Weight:      Height:      PainSc:        Isolation Precautions No active isolations  Medications Medications  0.9 %  sodium chloride infusion ( Intravenous New Bag/Given 07/18/17 1358)  potassium chloride 10 mEq in 100 mL IVPB (not administered)  bisacodyl (DULCOLAX) EC tablet 10 mg  (not administered)  sodium phosphate (FLEET) 7-19 GM/118ML enema 1 enema (not administered)    Followed by  sodium phosphate (FLEET) 7-19 GM/118ML enema 1 enema (not administered)  sodium chloride 0.9 % bolus 2,000 mL (0 mLs Intravenous Stopped 07/18/17 1555)  potassium chloride SA (K-DUR,KLOR-CON) CR tablet 40 mEq (40 mEq Oral Given 07/18/17 1552)  iopamidol (ISOVUE-300) 61 % injection 100 mL (100 mLs Intravenous Contrast Given 07/18/17 1500)  fentaNYL (SUBLIMAZE) injection 50 mcg (50 mcg Intravenous Given 07/18/17 1654)    Mobility non-ambulatory

## 2017-07-18 NOTE — ED Provider Notes (Signed)
[facility]-EMERGENCY DEPT Provider Note   CSN: [id number] Arrival date & time: 07/18/17  1216     History   Chief Complaint Chief Complaint  Patient presents with  . Emesis    HPI Catherine Butler is a 57 y.o. female.  57 year old female with history of Guillain-Barr, prolonged QT syndrome, quadriplegia who was discharged from the hospital 12 days ago presents with increasing dizziness as well as emesis times 1 day.  Denies any fever or diarrhea.  No cough or dyspnea.  Patient has had oliguria with out dysuria or hematuria.  No new medications started.  Abdominal pain is been crampy and she does not have it at this time.  Saw her doctor today for a follow-up from her recent hospitalization was found to be tachycardic as well as hypotensive.  Was told to come to the ED today for evaluation.  No treatment used prior to arrival      Past Medical History:  Diagnosis Date  . Anxiety   . Arthritis   . Asthma   . COPD (chronic obstructive pulmonary disease) ([facility])   . Depression   . H/O alcohol abuse   . Hypertension   . Reflux   . Suicide attempt by multiple drug overdose [facility]) 2014    Patient Active Problem List   Diagnosis Date Noted  . History of prolonged Q-T interval on ECG 07/06/2017  . Tachycardia   . Generalized anxiety disorder   . Acute lower UTI   . Debility 06/23/2017  . Constipation   . Quadriplegia and quadriparesis ([facility])   . Guillain Barr syndrome ([facility])   . Nausea & vomiting 06/21/2017  . Hypokalemia 06/21/2017  . Volume depletion 06/21/2017  . Sinus tachycardia 06/21/2017  . Nausea and vomiting 06/21/2017  . Essential hypertension 04/14/2017  . Paresthesia   . 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 Abdominal pain 01/01/2017  . Sepsis ([facility]) 01/01/2017  . Tobacco abuse 01/01/2017  . Abnormal LFTs 01/01/2017  . Depression   . COPD (chronic obstructive pulmonary disease) ([facility]) 06/16/2015     Past Surgical History:  Procedure Laterality Date  . LAPAROSCOPY    . laporscopy surgery for ovarian cyst  20 years ago  . TONSILLECTOMY      OB History    Gravida Para Term Preterm AB Living   3 1 1   1 1    SAB TAB Ectopic Multiple Live Births   1               Home Medications    Prior to Admission medications   Medication Sig Start Date End Date Taking? Authorizing Provider  acetaminophen (TYLENOL) 500 MG tablet Take 1,000 mg by mouth 2 (two) times daily as needed for headache.   Yes [provider]  albuterol (PROVENTIL HFA;VENTOLIN HFA) 108 (90 Base) MCG/ACT inhaler Inhale 2 puffs into the lungs every 6 (six) hours as needed for wheezing. 06/18/15  Yes Standley Brooking, MD  gabapentin (NEURONTIN) 400 MG capsule Take 2 capsules (800 mg total) by mouth 3 (three) times daily. 07/06/17  Yes Love, Evlyn Kanner, PA-C  methocarbamol (ROBAXIN) 500 MG tablet Take 1 tablet (500 mg total) by mouth every 6 (six) hours as needed for muscle spasms. 07/06/17  Yes Love, Evlyn Kanner, PA-C  mirtazapine (REMERON) 15 MG tablet Take 15 mg by mouth at bedtime.   Yes [provider]  polyethylene glycol (MIRALAX / GLYCOLAX) packet Take 17 g by  mouth 2 (two) times daily. Patient taking differently: Take 17 g by mouth daily as needed for mild constipation.  07/06/17  Yes Love, Evlyn Kanner, PA-C  QUEtiapine (SEROQUEL) 400 MG tablet Take 400 mg by mouth at bedtime.   Yes [provider]  senna-docusate (SENOKOT-S) 8.6-50 MG tablet Take 2 tablets by mouth 2 (two) times daily. Patient taking differently: Take 2 tablets by mouth 2 (two) times daily as needed for mild constipation.  07/06/17  Yes Love, Evlyn Kanner, PA-C  potassium chloride SA (K-DUR,KLOR-CON) 20 MEQ tablet Take 1 tablet (20 mEq total) by mouth daily. Patient not taking: Reported on 07/18/2017 07/06/17   Love, Evlyn Kanner, PA-C  traMADol (ULTRAM) 50 MG tablet Take 1 tablet (50 mg total) by mouth every 6 (six) hours as needed for  moderate pain. Patient not taking: Reported on 07/18/2017 07/06/17   Love, Evlyn Kanner, PA-C    Family History Family History  Problem Relation Age of Onset  . Diabetes Father   . Hypertension Father 50       heart attack  . Breast cancer Mother 30       lump removed, bile duct cancer  . Hypertension Brother   . Allergic rhinitis Paternal Grandfather     Social History Social History   Tobacco Use  . Smoking status: Current Every Day Smoker    Packs/day: 1.00    Years: 30.00    Pack years: 30.00    Types: Cigarettes  . Smokeless tobacco: Never Used  . Tobacco comment: states "might be interested in the smoking cessation program"  Pt given counseling related to smoking cessation  Substance Use Topics  . Alcohol use: Yes    Comment: couple x a week  . Drug use: No     Allergies   Lipitor [atorvastatin]   Review of Systems Review of Systems  All other systems reviewed and are negative.    Physical Exam Updated Vital Signs BP 99/74   Pulse (!) 112   Temp 97.6 F (36.4 C) (Oral)   Resp 16   Ht 1.676 m (5\' 6" )   Wt 54.9 kg (121 lb)   LMP 02/22/2006   SpO2 99%   BMI 19.53 kg/m   Physical Exam  Constitutional: She is oriented to person, place, and time. She appears well-developed and well-nourished.  Non-toxic appearance. No distress.  HENT:  Head: Normocephalic and atraumatic.  Eyes: Conjunctivae, EOM and lids are normal. Pupils are equal, round, and reactive to light.  Neck: Normal range of motion. Neck supple. No tracheal deviation present. No thyroid mass present.  Cardiovascular: Regular rhythm and normal heart sounds. Tachycardia present. Exam reveals no gallop.  No murmur heard. Pulmonary/Chest: Effort normal and breath sounds normal. No stridor. No respiratory distress. She has no decreased breath sounds. She has no wheezes. She has no rhonchi. She has no rales.  Abdominal: Soft. Normal appearance and bowel sounds are normal. She exhibits no distension.  There is no tenderness. There is no rigidity, no rebound, no guarding and no CVA tenderness.  Musculoskeletal: Normal range of motion. She exhibits no edema or tenderness.  Neurological: She is alert and oriented to person, place, and time. She displays atrophy. No cranial nerve deficit. She exhibits abnormal muscle tone. GCS eye subscore is 4. GCS verbal subscore is 5. GCS motor subscore is 6.  Skin: Skin is warm and dry. No abrasion and no rash noted. There is pallor.  Psychiatric: Her speech is normal. Her affect is blunt.  She is withdrawn.  Nursing note and vitals reviewed.    ED Treatments / Results  Labs (all labs ordered are listed, but only abnormal results are displayed) Labs Reviewed  I-STAT BETA HCG BLOOD, ED ([facility], [facility], [facility] ONLY) - Abnormal; Notable for the following components:      Result Value   I-stat hCG, quantitative 6.5 (*)    All other components within normal limits  CBC  LIPASE, BLOOD  COMPREHENSIVE METABOLIC PANEL  URINALYSIS, ROUTINE W REFLEX MICROSCOPIC    EKG  EKG Interpretation  Date/Time:  Monday July 18 2017 16:03:23 EST Ventricular Rate:  120 PR Interval:    QRS Duration: 83 QT Interval:  362 QTC Calculation: 512 R Axis:   8 Text Interpretation:  Sinus tachycardia Low voltage, extremity leads Prolonged QT interval Confirmed by Lorre NickAllen, Zeniya Lapidus ([id number]) on 07/18/2017 4:13:17 PM       Radiology No results found.  Procedures Procedures (including critical care time)  Medications Ordered in ED Medications  0.9 %  sodium chloride infusion (not administered)  sodium chloride 0.9 % bolus 2,000 mL (not administered)     Initial Impression / Assessment and Plan / ED Course  I have reviewed the triage vital signs and the nursing notes.  Pertinent labs & imaging results that were available during my care of the patient were reviewed by me and considered in my medical decision making (see chart for details).     Patient given oral potassium here.   She is also given IV fluids.  She received a total of 2000 cc.  Patient remains with autonomic instability despite IV fluids.  This is seen with her increased heart rate and low blood pressure.  Her CT results show a possible colonic mass as well as constipation.  Discussed with [facility] will come and see the patient and feels the patient needs to be admitted for inpatient workup for this likely colon cancer.  Patient also requires admission for her blood pressure and heart rate issues.  Will consult hospitalist  Final Clinical Impressions(s) / ED Diagnoses   Final diagnoses:  None    ED Discharge Orders    None       Lorre NickAllen, Kianna Billet, MD 07/18/17 [phone]

## 2017-07-18 NOTE — ED Notes (Signed)
Pt aware urine sample needed 

## 2017-07-18 NOTE — Plan of Care (Signed)
  Progressing Education: Knowledge of General Education information will improve 07/18/2017 2221 - Progressing by Cristela FeltSteffens, Jourdin Connors P, RN Clinical Measurements: Ability to maintain clinical measurements within normal limits will improve 07/18/2017 2221 - Progressing by Cristela FeltSteffens, Jala Dundon P, RN Pain Managment: General experience of comfort will improve 07/18/2017 2221 - Progressing by Cristela FeltSteffens, Darleny Sem P, RN Safety: Ability to remain free from injury will improve 07/18/2017 2221 - Progressing by Cristela FeltSteffens, Miyo Aina P, RN Skin Integrity: Risk for impaired skin integrity will decrease 07/18/2017 2221 - Progressing by Cristela FeltSteffens, Arali Somera P, RN

## 2017-07-18 NOTE — H&P (View-Only) (Signed)
[facility] Consult: 4:06 PM 07/18/2017  LOS: 0 days    Referring Provider: Dr Freida Busman in ED  Primary Care Physician:  Loletta Specter, PA-C Primary Gastroenterologist:  unassigned    Reason for Consultation:  Colon mass??   HPI: Catherine Butler is a 57 y.o. female.  Hx COPD.  Htn.  ETOh abuse.  Depression.   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  Unfortunately near quad afterwards and chronic leg pain.  Urinary retention.  Elevated LFTs with fatty liver per CT of 12/2016. LFTs c/w ETOH hepatitis in 03/2017.  HH and marked fatty liver, ? Chronic calcific pancreatitis per CT of 03/2017.  T11 endplate fracture.     Inpt admission 06/20/17 - 06/23/17 with N/V. ? Viral GI illness vs food poisoning. CT of 06/20/17 with fatty liver, HH, pancreatic calcifications and fecal retention.  Discharged to [facility] rehab,  Stayed 1/3 - 07/05/16. 06/23/17 urine grew E coli.  At discharge was ambulating up to 30 feet with rest breaks.     Patient has a long-standing history of constipation and only moves her bowels every 10-14 days.  She says that around 20 years ago she underwent colonoscopy which was unremarkable.  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  She periodically has abdominal pain.  Her appetite is generally poor.  She reports 60 pound weight loss since October 2018. For the last 7-10 days she has had hypotension and gets dizzy when she tries to get up.  She is been too dizzy and weak to ambulate.  She is also noticed her heart rate getting up to the 120s.  This morning she developed nausea and had episodes of nonbloody, bilious emesis.  She complains of pain which is nonfocal but emanates from the mid abdomen.  In the ED her  heart rate is in the 120s.  Blood pressure on arrival 82/61. CT repeat 1/28:Significant stool burden proximal to a relative narrowing in the mid descending colon at the level of the left iliac crest, raising ? Of mass.  No other new changes.   Albumin low but o/w normal LFTs.  Na and potassium low.  Hgb 13.4, c/w ~ 10.5 2 weeks ago.   coags normal as recently as 06/20/17.      Last consumed alcohol prior to her 03/2017 admission.  Living at her parents home.  3 of colon cancer.  Past Medical History:  Diagnosis Date  . Anxiety   . Arthritis   . Asthma   . COPD (chronic obstructive pulmonary disease) (HCC)   . Depression   . H/O alcohol abuse   . Hypertension   . Reflux   . Suicide attempt by multiple drug overdose (HCC) 2014    Past Surgical History:  Procedure Laterality Date  . LAPAROSCOPY    . laporscopy surgery for ovarian cyst  20 years ago  . TONSILLECTOMY      Prior to Admission medications   Medication Sig Start  Date End Date Taking? Authorizing Provider  acetaminophen (TYLENOL) 500 MG tablet Take 1,000 mg by mouth 2 (two) times daily as needed for headache.   Yes [provider]  albuterol (PROVENTIL HFA;VENTOLIN HFA) 108 (90 Base) MCG/ACT inhaler Inhale 2 puffs into the lungs every 6 (six) hours as needed for wheezing. 06/18/15  Yes Standley Brooking, MD  gabapentin (NEURONTIN) 400 MG capsule Take 2 capsules (800 mg total) by mouth 3 (three) times daily. 07/06/17  Yes Love, Evlyn Kanner, PA-C  methocarbamol (ROBAXIN) 500 MG tablet Take 1 tablet (500 mg total) by mouth every 6 (six) hours as needed for muscle spasms. 07/06/17  Yes Love, Evlyn Kanner, PA-C  mirtazapine (REMERON) 15 MG tablet Take 15 mg by mouth at bedtime.   Yes [provider]  polyethylene glycol (MIRALAX / GLYCOLAX) packet Take 17 g by mouth 2 (two) times daily. Patient taking differently: Take 17 g by mouth daily as needed for mild constipation.  07/06/17  Yes Love, Evlyn Kanner, PA-C  QUEtiapine  (SEROQUEL) 400 MG tablet Take 400 mg by mouth at bedtime.   Yes [provider]  senna-docusate (SENOKOT-S) 8.6-50 MG tablet Take 2 tablets by mouth 2 (two) times daily. Patient taking differently: Take 2 tablets by mouth 2 (two) times daily as needed for mild constipation.  07/06/17  Yes Love, Evlyn Kanner, PA-C  potassium chloride SA (K-DUR,KLOR-CON) 20 MEQ tablet Take 1 tablet (20 mEq total) by mouth daily. Patient not taking: Reported on 07/18/2017 07/06/17   Love, Evlyn Kanner, PA-C  traMADol (ULTRAM) 50 MG tablet Take 1 tablet (50 mg total) by mouth every 6 (six) hours as needed for moderate pain. Patient not taking: Reported on 07/18/2017 07/06/17   Jacquelynn Cree, PA-C    Scheduled Meds:  Infusions: . sodium chloride 125 mL/hr at 07/18/17 1358   PRN Meds:    Allergies as of 07/18/2017 - Review Complete 07/18/2017  Allergen Reaction Noted  . Lipitor [atorvastatin] Other (See Comments) 04/11/2017    Family History  Problem Relation Age of Onset  . Diabetes Father   . Hypertension Father 50       heart attack  . Breast cancer Mother 30       lump removed, bile duct cancer  . Hypertension Brother   . Allergic rhinitis Paternal Grandfather     Social History   Socioeconomic History  . Marital status: Single    Spouse name: Not on file  . Number of children: Not on file  . Years of education: Not on file  . Highest education level: Not on file  Social Needs  . Financial resource strain: Not on file  . Food insecurity - worry: Not on file  . Food insecurity - inability: Not on file  . Transportation needs - medical: Not on file  . Transportation needs - non-medical: Not on file  Occupational History  . Occupation: unemployed  Tobacco Use  . Smoking status: Current Every Day Smoker    Packs/day: 1.00    Years: 30.00    Pack years: 30.00    Types: Cigarettes  . Smokeless tobacco: Never Used  . Tobacco comment: states "might be interested in the smoking cessation  program"  Pt given counseling related to smoking cessation  Substance and Sexual Activity  . Alcohol use: Yes    Comment: couple x a week  . Drug use: No  . Sexual activity: Not Currently  Other Topics Concern  . Not on file  Social  History Narrative   ** Merged History Encounter **        REVIEW OF SYSTEMS: Constitutional: Weakness, dizziness. ENT:  No nose bleeds Pulm: No difficulty breathing.  No productive cough. CV: Tachycardia, no chest pain. GU:  No hematuria, no frequency GI:  Per HPI Heme: Denies unusual bleeding or bruising. Transfusions: No recall of previous blood transfusions. Neuro:  No headaches, no peripheral tingling or numbness Derm:  No itching, no rash or sores.  Endocrine:  No sweats or chills.  No polyuria or dysuria Immunization: Reviewed records.  I do not see a current flu vaccination but she was vaccinated for pneumococcal in 05/2015 and 12/2016 Travel:  None beyond local counties in last few months.    PHYSICAL EXAM: Vital signs in last 24 hours: Vitals:   07/18/17 1552 07/18/17 1554  BP: 115/84 93/79  Pulse: (!) 112 (!) 122  Resp: 16   Temp:    SpO2: 98%    Wt Readings from Last 3 Encounters:  07/18/17 54.9 kg (121 lb)  07/18/17 54.9 kg (121 lb)  07/06/17 62.2 kg (137 lb 0.4 oz)    General: Pale, chronically unwell looking, slightly uncomfortable WF. Head: No facial asymmetry or swelling.  No signs of head trauma. Eyes: No conjunctival pallor.  No scleral icterus. Ears: Not hard of hearing Nose: No discharge or congestion Mouth: Tongue midline.  Oral mucosa moist, pink, clear. Neck: No JVD, thyromegaly or bruits. Lungs: Clear bilaterally.  No labored breathing.  No cough. Heart: Tachycardic, regular.  No MRG.  S1, S2 present. Abdomen: Soft.  Not distended.  Some fullness in the lower abdomen but no distinct masses.  No HSM.  Tenderness is mild and diffuse throughout abdomen.   Rectal: No palpable masses, no palpable stool.  A suspect  of beige colored stool tests FOBT negative.  Good rectal tone. Musc/Skeltl: No joint redness, swelling or significant deformities or contractures. Extremities: No CCE.  The musculature of the legs looks diminished. Neurologic: The patient is alert.  She is oriented x3.  She is able to move all 4 limbs, limb strength was not tested.  No tremors. Skin: Pale.  No rashes, sores, telangiectasia. Nodes: No cervical or inguinal adenopathy. Psych: Affect flat, seems depressed but she is conversant, provides good history and cooperative.  Intake/Output from previous day: No intake/output data recorded. Intake/Output this shift: Total I/O In: 2000 [IV Piggyback:2000] Out: -   LAB RESULTS: Recent Labs    07/18/17 1242  WBC 5.9  HGB 13.4  HCT 38.9  PLT 285   BMET Lab Results  Component Value Date   NA 132 (L) 07/18/2017   NA 136 07/06/2017   NA 133 (L) 07/05/2017   K 3.1 (L) 07/18/2017   K 4.0 07/06/2017   K 3.3 (L) 07/05/2017   CL 96 (L) 07/18/2017   CL 106 07/06/2017   CL 99 (L) 07/05/2017   CO2 24 07/18/2017   CO2 18 (L) 07/06/2017   CO2 21 (L) 07/05/2017   GLUCOSE 132 (H) 07/18/2017   GLUCOSE 142 (H) 07/06/2017   GLUCOSE 120 (H) 07/05/2017   BUN 6 07/18/2017   BUN 10 07/06/2017   BUN 12 07/05/2017   CREATININE 0.66 07/18/2017   CREATININE 0.56 07/06/2017   CREATININE 0.56 07/05/2017   CALCIUM 8.6 (L) 07/18/2017   CALCIUM 8.8 (L) 07/06/2017   CALCIUM 8.4 (L) 07/05/2017   LFT Recent Labs    07/18/17 1242  PROT 6.8  ALBUMIN 2.9*  AST 33  ALT 20  ALKPHOS 120  BILITOT 0.9   PT/INR Lab Results  Component Value Date   INR 0.93 06/20/2017   INR 1.00 01/02/2017   INR 0.90 03/16/2012   Hepatitis Panel No results for input(s): HEPBSAG, HCVAB, HEPAIGM, HEPBIGM in the last 72 hours. C-Diff No components found for: CDIFF Lipase     Component Value Date/Time   LIPASE 33 07/18/2017 1242    Drugs of Abuse     Component Value Date/Time   LABOPIA NONE DETECTED  04/12/2017 1539   COCAINSCRNUR NONE DETECTED 04/12/2017 1539   LABBENZ NONE DETECTED 04/12/2017 1539   AMPHETMU NONE DETECTED 04/12/2017 1539   THCU NONE DETECTED 04/12/2017 1539   LABBARB NONE DETECTED 04/12/2017 1539     RADIOLOGY STUDIES: Ct Abdomen Pelvis W Contrast  Result Date: 07/18/2017 CLINICAL DATA:  Patient reports nausea, intractable vomiting since this morning. Generalized abdominal pain. Oliguria. Weight loss. History of quadriplegia, quadrant paresis. GBS. EXAM: CT ABDOMEN AND PELVIS WITH CONTRAST TECHNIQUE: Multidetector CT imaging of the abdomen and pelvis was performed using the standard protocol following bolus administration of intravenous contrast. CONTRAST:  100 cm3 Isovue-300 COMPARISON:  Plain film 07/05/2017, CT 06/20/2017 FINDINGS: Lower chest: There are dependent changes versus scarring in the lung bases, left greater than right. Moderate hiatal hernia. Hepatobiliary: Diffuse low-attenuation of the liver without focal lesion. Gallbladder is present. Pancreas: Pancreas appears atrophic. Pancreatic calcifications are consistent with history of pancreatitis. No focal pancreatic lesions. Spleen: Normal in size without focal abnormality. Adrenals/Urinary Tract: Adrenal glands are unremarkable. Kidneys are normal, without renal calculi, focal lesion, or hydronephrosis. Bladder is unremarkable. Stomach/Bowel: Large hiatal hernia. Stomach and small bowel loops are otherwise normal in appearance. There is a large amount of stool within mildly distended large bowel loops. There is question of a relative transition zone in the level of the mid descending colon at the level of the left iliac crest on coronal image number 48 of series 4, axial image 56 of series 2. Distal to this point, there is less distension, left stool, raising the question of a stricture, possibly a malignant lesion. Vascular/Lymphatic: There is atherosclerotic calcification of the abdominal aorta not associated with  aneurysm. No retroperitoneal or mesenteric adenopathy. Reproductive: Uterus is present. No adnexal mass. No free pelvic fluid. Other: None Musculoskeletal: Superior endplate fracture of T11 appears stable. No acute osseous abnormality. IMPRESSION: 1. Significant stool burden proximal to a relative narrowing in the mid descending colon at the level of the left iliac crest. This raises a question of mass in this region. Consider colonoscopy. 2. Hepatic steatosis. 3. Changes compatible with chronic pancreatitis. 4. Large hiatal hernia. 5.  Aortic atherosclerosis.  (ICD10-I70.0) 6. Chronic superior endplate fracture of T11. Electronically Signed   By: Norva Pavlov M.D.   On: 07/18/2017 15:28     IMPRESSION:   *  Nausea vomiting. Non-bloody, due to colonic obstruction?  *  Fecal retention raising ? Of colon mass in mid descending colon.   Many years of chronic constipation.  Appears to have worsened since her diagnosis of GBS which may be attributed to neuropathy but need to rule out obstructing lesion.  Her 60 pound weight loss is likely multifactorial. S/p remote, apparently unremarkable colonoscopy when the patient was in her 30s  *  Pancreatic calcifications, likely chronic pancreatitis.  *  Fatty liver, hx what looks like ETOH hepatitis by AST/ALT in past.  Hx ETOH abuse.    *  Hypokalemia.  Hyponatremia.  *  Hx  GBS 03/2017.      PLAN:     *  Dr. Christella Hartigan will see the patient in the morning.  Suspect she is going to need colonoscopy but before we can proceed, need to resolve the nausea vomiting in order to prep for the study.  *   Fleets enema x 2 and po dulclax in attempt to clear stool.  *   Clear liquids as tolerated.   Jennye Moccasin  07/18/2017, 4:06 PM Pager: [phone]  ________________________________________________________________________  [facility] MD note:  I personally examined the patient, reviewed the data and agree with the assessment and plan described above.   Sent to hosp with hypotension, dizziness.  Has had vomiting/abd pain for 1-2 days.  No change in her chronically constipated bowels.  CT suggests narrowing in descending segment, unclear etiology.  She is not obstructed clinically or by imaging but I do see a focal narrowing in her descending. This may be spasm related.  She will need colonoscopy when her UGI symptoms resolve; still nausea/vomiting overnight a bit. OK for clears to day, will check back tomorrow.   Rob Bunting, MD [facility] Pager [phone]

## 2017-07-18 NOTE — H&P (Signed)
History and Physical    Catherine Butler:[id number] DOB: [birth year] DOA: 07/18/2017  PCP: Loletta Specter, PA-C  Patient coming from: Home  I have personally briefly reviewed patient's old medical records in [facility]  Chief Complaint: Hypotension/Tachycardia./Nausea and emesis  HPI: Catherine Butler is a 57 y.o. female with medical history significant of DM.,  Prolonged QT syndrome, quadriplegia, COPD, who was recently discharged 12 days ago presented with increasing dizziness, nausea or emesis x 1 day, mid abdominal pain.  Patient stated went to see her PCP for hospital follow-up and was noted to be hypotensive and tachycardic.  Patient was noted to have systolic blood pressures in the 80s and sent to the ED.  Patient denies any fevers, no chills, no diarrhea, no constipation, no melena, no  hematemesis, no hematochezia, no syncope, no change in shortness of breath, no chest pain, no asymmetric weakness or numbness.  Patient does state she has had increased heart rate over the past month.  She endorses poor oral intake.  ED Course: Patient seen in the ED, comprehensive metabolic profile with a sodium of 132, potassium of 3.1, chloride of 96, glucose of 132 otherwise is within normal limits.  CBC was unremarkable.  Lactic acid level was 0.97.  CT abdomen and pelvis which was done showed significant stool burden proximal to a relative narrowing in the mid descending colon at the level of the left iliac crest concern for mass in this region.  Hepatic steatosis.  Changes compatible chronic pancreatitis.  Large ischial hernia.  Chronic superior endplate fracture of T11.  Gastroenterology was consulted.  Review of Systems: As per HPI otherwise 10 point review of systems negative.   Past Medical History:  Diagnosis Date  . Anxiety   . Arthritis   . Asthma   . COPD (chronic obstructive pulmonary disease) (HCC)   . Depression   . H/O alcohol abuse   . Hypertension   . Reflux   . Suicide  attempt by multiple drug overdose (HCC) 2014    Past Surgical History:  Procedure Laterality Date  . LAPAROSCOPY    . laporscopy surgery for ovarian cyst  20 years ago  . TONSILLECTOMY       reports that she has been smoking cigarettes.  She has a 30.00 pack-year smoking history. she has never used smokeless tobacco. She reports that she drinks alcohol. She reports that she does not use drugs.  Allergies  Allergen Reactions  . Lipitor [Atorvastatin] Other (See Comments)    "really bad leg pain"    Family History  Problem Relation Age of Onset  . Diabetes Father   . Hypertension Father 50       heart attack  . Kidney disease Father   . Heart attack Father   . Heart failure Father   . Dementia Father   . Breast cancer Mother 30       lump removed, bile duct cancer  . COPD Mother   . Hypertension Brother   . Allergic rhinitis Paternal Grandfather    Mother alive age 96 with COPD.  Father alive age 1 history of MI, dementia, CHF, chronic kidney disease.  Prior to Admission medications   Medication Sig Start Date End Date Taking? Authorizing Provider  acetaminophen (TYLENOL) 500 MG tablet Take 1,000 mg by mouth 2 (two) times daily as needed for headache.   Yes [provider]  albuterol (PROVENTIL HFA;VENTOLIN HFA) 108 (90 Base) MCG/ACT inhaler Inhale 2 puffs into the  lungs every 6 (six) hours as needed for wheezing. 06/18/15  Yes Standley BrookingGoodrich, Jaelah Hauth P, MD  gabapentin (NEURONTIN) 400 MG capsule Take 2 capsules (800 mg total) by mouth 3 (three) times daily. 07/06/17  Yes Love, Evlyn KannerPamela S, PA-C  methocarbamol (ROBAXIN) 500 MG tablet Take 1 tablet (500 mg total) by mouth every 6 (six) hours as needed for muscle spasms. 07/06/17  Yes Love, Evlyn KannerPamela S, PA-C  mirtazapine (REMERON) 15 MG tablet Take 15 mg by mouth at bedtime.   Yes [provider]  polyethylene glycol (MIRALAX / GLYCOLAX) packet Take 17 g by mouth 2 (two) times daily. Patient taking differently: Take 17 g by  mouth daily as needed for mild constipation.  07/06/17  Yes Love, Evlyn KannerPamela S, PA-C  QUEtiapine (SEROQUEL) 400 MG tablet Take 400 mg by mouth at bedtime.   Yes [provider]  senna-docusate (SENOKOT-S) 8.6-50 MG tablet Take 2 tablets by mouth 2 (two) times daily. Patient taking differently: Take 2 tablets by mouth 2 (two) times daily as needed for mild constipation.  07/06/17  Yes Love, Evlyn KannerPamela S, PA-C  potassium chloride SA (K-DUR,KLOR-CON) 20 MEQ tablet Take 1 tablet (20 mEq total) by mouth daily. Patient not taking: Reported on 07/18/2017 07/06/17   Love, Evlyn KannerPamela S, PA-C  traMADol (ULTRAM) 50 MG tablet Take 1 tablet (50 mg total) by mouth every 6 (six) hours as needed for moderate pain. Patient not taking: Reported on 07/18/2017 07/06/17   Jerene PitchLove, Pamela S, PA-C    Physical Exam: Vitals:   07/18/17 1554 07/18/17 1600 07/18/17 1654 07/18/17 1816  BP: 93/79 113/85 113/85 106/83  Pulse: (!) 122 (!) 119 (!) 115 (!) 115  Resp:  14 16 (!) 22  Temp:    99 F (37.2 C)  TempSrc:    Oral  SpO2:  99% 99% 99%  Weight:    54.9 kg (121 lb)  Height:    5\' 6"  (1.676 m)    Constitutional: NAD, calm, comfortable Vitals:   07/18/17 1554 07/18/17 1600 07/18/17 1654 07/18/17 1816  BP: 93/79 113/85 113/85 106/83  Pulse: (!) 122 (!) 119 (!) 115 (!) 115  Resp:  14 16 (!) 22  Temp:    99 F (37.2 C)  TempSrc:    Oral  SpO2:  99% 99% 99%  Weight:    54.9 kg (121 lb)  Height:    5\' 6"  (1.676 m)   Eyes: PERRLA, EOMI, lids and conjunctivae normal ENMT: Mucous membranes are dry. Posterior pharynx clear of any exudate or lesions.Normal dentition.  Neck: normal, supple, no masses, no thyromegaly Respiratory: clear to auscultation bilaterally, no wheezing, no crackles. Normal respiratory effort. No accessory muscle use.  Cardiovascular: Regular rate and rhythm, no murmurs / rubs / gallops. No extremity edema. 2+ pedal pulses. No carotid bruits.  Abdomen: Mid to lower abdomen with some tenderness to  palpation, soft, nondistended, positive bowel sounds.   Musculoskeletal: no clubbing / cyanosis. No joint deformity upper and lower extremities. Good ROM, no contractures. Normal muscle tone.  Skin: no rashes, lesions, ulcers. No induration Neurologic: CN 2-12 grossly intact. Sensation intact, DTR normal. Strength 5/5 in all 4.  Psychiatric: Normal judgment and insight. Alert and oriented x 3. Normal mood.   Labs on Admission: I have personally reviewed following labs and imaging studies  CBC: Recent Labs  Lab 07/18/17 1242  WBC 5.9  HGB 13.4  HCT 38.9  MCV 87.8  PLT 285   Basic Metabolic Panel: Recent Labs  Lab 07/18/17  1242  NA 132*  K 3.1*  CL 96*  CO2 24  GLUCOSE 132*  BUN 6  CREATININE 0.66  CALCIUM 8.6*  MG 1.0*   GFR: Estimated Creatinine Clearance: 69.7 mL/min (by C-G formula based on SCr of 0.66 mg/dL). Liver Function Tests: Recent Labs  Lab 07/18/17 1242  AST 33  ALT 20  ALKPHOS 120  BILITOT 0.9  PROT 6.8  ALBUMIN 2.9*   Recent Labs  Lab 07/18/17 1242  LIPASE 33   No results for input(s): AMMONIA in the last 168 hours. Coagulation Profile: No results for input(s): INR, PROTIME in the last 168 hours. Cardiac Enzymes: No results for input(s): CKTOTAL, CKMB, CKMBINDEX, TROPONINI in the last 168 hours. BNP (last 3 results) No results for input(s): PROBNP in the last 8760 hours. HbA1C: No results for input(s): HGBA1C in the last 72 hours. CBG: No results for input(s): GLUCAP in the last 168 hours. Lipid Profile: No results for input(s): CHOL, HDL, LDLCALC, TRIG, CHOLHDL, LDLDIRECT in the last 72 hours. Thyroid Function Tests: No results for input(s): TSH, T4TOTAL, FREET4, T3FREE, THYROIDAB in the last 72 hours. Anemia Panel: No results for input(s): VITAMINB12, FOLATE, FERRITIN, TIBC, IRON, RETICCTPCT in the last 72 hours. Urine analysis:    Component Value Date/Time   COLORURINE YELLOW 06/23/2017 1519   APPEARANCEUR CLEAR 06/23/2017 1519    LABSPEC 1.004 (L) 06/23/2017 1519   PHURINE 6.0 06/23/2017 1519   GLUCOSEU NEGATIVE 06/23/2017 1519   HGBUR NEGATIVE 06/23/2017 1519   BILIRUBINUR NEGATIVE 06/23/2017 1519   KETONESUR NEGATIVE 06/23/2017 1519   PROTEINUR NEGATIVE 06/23/2017 1519   UROBILINOGEN 0.2 09/13/2012 0124   NITRITE NEGATIVE 06/23/2017 1519   LEUKOCYTESUR SMALL (A) 06/23/2017 1519    Radiological Exams on Admission: Ct Abdomen Pelvis W Contrast  Result Date: 07/18/2017 CLINICAL DATA:  Patient reports nausea, intractable vomiting since this morning. Generalized abdominal pain. Oliguria. Weight loss. History of quadriplegia, quadrant paresis. GBS. EXAM: CT ABDOMEN AND PELVIS WITH CONTRAST TECHNIQUE: Multidetector CT imaging of the abdomen and pelvis was performed using the standard protocol following bolus administration of intravenous contrast. CONTRAST:  100 cm3 Isovue-300 COMPARISON:  Plain film 07/05/2017, CT 06/20/2017 FINDINGS: Lower chest: There are dependent changes versus scarring in the lung bases, left greater than right. Moderate hiatal hernia. Hepatobiliary: Diffuse low-attenuation of the liver without focal lesion. Gallbladder is present. Pancreas: Pancreas appears atrophic. Pancreatic calcifications are consistent with history of pancreatitis. No focal pancreatic lesions. Spleen: Normal in size without focal abnormality. Adrenals/Urinary Tract: Adrenal glands are unremarkable. Kidneys are normal, without renal calculi, focal lesion, or hydronephrosis. Bladder is unremarkable. Stomach/Bowel: Large hiatal hernia. Stomach and small bowel loops are otherwise normal in appearance. There is a large amount of stool within mildly distended large bowel loops. There is question of a relative transition zone in the level of the mid descending colon at the level of the left iliac crest on coronal image number 48 of series 4, axial image 56 of series 2. Distal to this point, there is less distension, left stool, raising the  question of a stricture, possibly a malignant lesion. Vascular/Lymphatic: There is atherosclerotic calcification of the abdominal aorta not associated with aneurysm. No retroperitoneal or mesenteric adenopathy. Reproductive: Uterus is present. No adnexal mass. No free pelvic fluid. Other: None Musculoskeletal: Superior endplate fracture of T11 appears stable. No acute osseous abnormality. IMPRESSION: 1. Significant stool burden proximal to a relative narrowing in the mid descending colon at the level of the left iliac crest. This raises  a question of mass in this region. Consider colonoscopy. 2. Hepatic steatosis. 3. Changes compatible with chronic pancreatitis. 4. Large hiatal hernia. 5.  Aortic atherosclerosis.  (ICD10-I70.0) 6. Chronic superior endplate fracture of T11. Electronically Signed   By: Norva Pavlov M.D.   On: 07/18/2017 15:28    EKG: Independently reviewed.  Sinus tachycardia with prolonged QT  Assessment/Plan Principal Problem:   Colonic mass Active Problems:   COPD (chronic obstructive pulmonary disease) (HCC)   Depression   Essential hypertension   Nausea & vomiting   Hypokalemia   Sinus tachycardia   Debility   Constipation   Quadriplegia and quadriparesis (HCC)   Guillain Barr syndrome (HCC)   Generalized anxiety disorder   History of prolonged Q-T interval on ECG   Dehydration   Hypotension   Hypomagnesemia   #1 probable colonic mass Per CT abdomen and pelvis concern for colonic mass as patient was significant stool burden proximal to a relative narrowing in the mid descending colon at the level of the left iliac crest.  GI has been consulted for further evaluation and management.  Patient currently on clear liquids.  Per GI.  2.  Nausea and vomiting Concern for possible partial obstruction secondary to problem #1.  We will keep patient on clear liquid diet.  GI has been consulted for further evaluation and management.  IV fluids.  Supportive care.  3.   Hypotension Likely secondary to hypovolemic hypotension secondary to poor oral intake, nausea and emesis.  Patient looks clinically dry on examination.  Patient is afebrile.  WBC within normal limits.  Lactic acid level within normal limits.  Doubt if infectious.  EKG with normal sinus rhythm with QT prolongation.  Patient denies any chest pain.  Doubt if cardiac etiology.  Patient with no overt bleeding.  Blood pressure improving with IV fluids.  Continue IV fluid boluses.  Placed on IV fluids and follow.  4.  Hypokalemia/hypomagnesemia Likely secondary to GI losses.  Replete.  5.  Dehydration IV fluids.  6.  Guillain-Barr syndrome PT/OT.  Outpatient follow-up.  7.  Constipation Patient has been started on a fleets enema x2 and oral Dulcolax per gastroenterology.  Per GI.  8.  COPD Stable.  9.  Sinus tachycardia Likely secondary to problem #1 2 and 5.  Check a TSH.  IV fluids.  Follow.  Supportive care.  10.  General anxiety disorder/depression No suicidal or homicidal ideations.  Outpatient follow-up.  11.  History of QT prolongation Replete electrolytes.  Keep magnesium greater than 2.  Keep potassium greater than 4.   DVT prophylaxis: Lovenox Code Status: Full Family Communication: Updated patient.  No family at bedside. Disposition Plan: To be determined. Consults called: GI, [place] Admission status: Admit to inpatient   Ramiro Harvest MD [facility] Pager [phone]  If 7PM-7AM, please contact night-coverage [email or web address] Password [facility]  07/18/2017, 7:37 PM

## 2017-07-18 NOTE — ED Notes (Signed)
Report called to Melissa RN

## 2017-07-18 NOTE — Patient Instructions (Addendum)
Your symptoms and vitals are extremely concerning and warrant emergent evaluation at the emergency department.   Hypotension As your heart beats, it forces blood through your body. This force is called blood pressure. If you have hypotension, you have low blood pressure. When your blood pressure is too low, you may not get enough blood to your brain. You may feel weak, feel light-headed, have a fast heartbeat, or even pass out (faint). Follow these instructions at home: Eating and drinking  Drink enough fluids to keep your pee (urine) clear or pale yellow.  Eat a healthy diet, and follow instructions from your doctor about eating or drinking restrictions. A healthy diet includes: ? Fresh fruits and vegetables. ? Whole grains. ? Low-fat (lean) meats. ? Low-fat dairy products.  Eat extra salt only as told. Do not add extra salt to your diet unless your doctor tells you to.  Eat small meals often.  Avoid standing up quickly after you eat. Medicines  Take over-the-counter and prescription medicines only as told by your doctor. ? Follow instructions from your doctor about changing how much you take (the dosage) of your medicines, if this applies. ? Do not stop or change your medicine on your own. General instructions  Wear compression stockings as told by your doctor.  Get up slowly from lying down or sitting.  Avoid hot showers and a lot of heat as told by your doctor.  Return to your normal activities as told by your doctor. Ask what activities are safe for you.  Do not use any products that contain nicotine or tobacco, such as cigarettes and e-cigarettes. If you need help quitting, ask your doctor.  Keep all follow-up visits as told by your doctor. This is important. Contact a doctor if:  You throw up (vomit).  You have watery poop (diarrhea).  You have a fever for more than 2-3 days.  You feel more thirsty than normal.  You feel weak and tired. Get help right away  if:  You have chest pain.  You have a fast or irregular heartbeat.  You lose feeling (get numbness) in any part of your body.  You cannot move your arms or your legs.  You have trouble talking.  You get sweaty or feel light-headed.  You faint.  You have trouble breathing.  You have trouble staying awake.  You feel confused. This information is not intended to replace advice given to you by your health care provider. Make sure you discuss any questions you have with your health care provider. Document Released: 09/01/2009 Document Revised: 02/24/2016 Document Reviewed: 02/24/2016 Elsevier Interactive Patient Education  2017 [employer]

## 2017-07-18 NOTE — ED Triage Notes (Signed)
Patient reports she was sent from PCP for hypotension and tachycardia. Reports abdominal pain with vomiting and constipation x3 days.

## 2017-07-18 NOTE — Progress Notes (Signed)
Subjective:  Patient ID: Catherine Butler, female    DOB: 1960/08/12  Age: 57 y.o. MRN: [id number]  CC: hospital f/u  HPI  Catherine Butler a 57 y.o.femalewith a PMH of anxiety, asthma, HTN arthritis, COPD, depression, suicide attempt by multiple drug overdose, and acid reflux presents on hospital f/u. Went to hospital on 06/23/17 and discharged on 07/06/17. Diagnosed with GBS, quadriplegia, quadriparesis, GAD, sinus tachycardia, hypokalemia, and debility. Says he is nauseated and has intractable vomiting since this morning. There is generalized abdominal pain. Describes oliguria. Feels cold and feels as if her heart is going to "beat out of my chest". She continues to lose weight and feels very debilitated.     Outpatient Medications Prior to Visit  Medication Sig Dispense Refill  . acetaminophen (TYLENOL) 500 MG tablet Take 1,000 mg by mouth 2 (two) times daily as needed for headache.    . albuterol (PROVENTIL HFA;VENTOLIN HFA) 108 (90 Base) MCG/ACT inhaler Inhale 2 puffs into the lungs every 6 (six) hours as needed for wheezing. 1 Inhaler 0  . gabapentin (NEURONTIN) 400 MG capsule Take 2 capsules (800 mg total) by mouth 3 (three) times daily. 180 capsule 0  . methocarbamol (ROBAXIN) 500 MG tablet Take 1 tablet (500 mg total) by mouth every 6 (six) hours as needed for muscle spasms. 120 tablet 0  . mirtazapine (REMERON) 15 MG tablet Take 15 mg by mouth at bedtime.    . polyethylene glycol (MIRALAX / GLYCOLAX) packet Take 17 g by mouth 2 (two) times daily. 60 each 0  . potassium chloride SA (K-DUR,KLOR-CON) 20 MEQ tablet Take 1 tablet (20 mEq total) by mouth daily. 3 tablet 0  . QUEtiapine (SEROQUEL) 400 MG tablet Take 400 mg by mouth at bedtime.    . senna-docusate (SENOKOT-S) 8.6-50 MG tablet Take 2 tablets by mouth 2 (two) times daily. 120 tablet 0  . traMADol (ULTRAM) 50 MG tablet Take 1 tablet (50 mg total) by mouth every 6 (six) hours as needed for moderate pain. 30 tablet 0   No  facility-administered medications prior to visit.      ROS Review of Systems  Constitutional: Positive for chills, malaise/fatigue and weight loss. Negative for fever.  Eyes: Negative for blurred vision.  Respiratory: Negative for shortness of breath.   Cardiovascular: Negative for chest pain and palpitations.       Tachycardia  Gastrointestinal: Positive for nausea and vomiting. Negative for abdominal pain.  Genitourinary: Negative for dysuria and hematuria.       Oliguria  Musculoskeletal: Negative for joint pain and myalgias.  Skin: Negative for rash.  Neurological: Negative for tingling and headaches.  Psychiatric/Behavioral: Negative for depression. The patient is not nervous/anxious.     Objective:  BP (!) 87/65 (BP Location: Left Arm, Patient Position: Sitting, Cuff Size: Normal)   Pulse (!) 121   Temp (!) 97.3 F (36.3 C) (Oral)   Resp 20   Ht 5\' 6"  (1.676 m)   Wt 121 lb (54.9 kg)   LMP 02/22/2006   SpO2 99%   BMI 19.53 kg/m   BP/Weight 07/18/2017 07/06/2017 06/23/2017  Systolic BP 87 99 137  Diastolic BP 65 75 102  Wt. (Lbs) 121 137.03 151.9  BMI 19.53 24.27 26.91      Physical Exam  Constitutional: She is oriented to person, place, and time.  Thin, debilitated, sitting in wheelchair, nauseated, coherent, polite  HENT:  Head: Normocephalic and atraumatic.  Eyes: Conjunctivae are normal. No scleral icterus.  Neck:  Normal range of motion. Neck supple. No thyromegaly present.  Cardiovascular: Regular rhythm and normal heart sounds.  tachycardic  Pulmonary/Chest: Effort normal and breath sounds normal. No respiratory distress.  Musculoskeletal: She exhibits no edema.  Neurological: She is alert and oriented to person, place, and time. No cranial nerve deficit. Coordination normal.  Skin: Skin is warm and dry. No rash noted. No erythema. No pallor.  Psychiatric: Her behavior is normal. Thought content normal.  Vitals reviewed.    Assessment & Plan:   1.  GBS (Guillain Barre syndrome) (HCC)  2. Hypotension, unspecified hypotension type  3. Tachycardia  4. Hypothermia, initial encounter  5. Oliguria  6. Generalized abdominal pain  7. Intractable vomiting with nausea, unspecified vomiting type  8. Loss of weight  *Pt and son were instructed to go immediately to the ED for emergent evaluation. I have educated them on the risks of ignoring her vitals and symptoms which would include coma and death. Pt[facility]     Follow-up: Return if symptoms worsen or fail to improve, for after hospital discharge.   Loletta Specteroger David Zebulin Siegel PA

## 2017-07-19 ENCOUNTER — Other Ambulatory Visit: Payer: Self-pay

## 2017-07-19 DIAGNOSIS — I10 Essential (primary) hypertension: Secondary | ICD-10-CM

## 2017-07-19 DIAGNOSIS — J449 Chronic obstructive pulmonary disease, unspecified: Secondary | ICD-10-CM

## 2017-07-19 DIAGNOSIS — R112 Nausea with vomiting, unspecified: Secondary | ICD-10-CM

## 2017-07-19 DIAGNOSIS — E876 Hypokalemia: Secondary | ICD-10-CM

## 2017-07-19 DIAGNOSIS — N390 Urinary tract infection, site not specified: Secondary | ICD-10-CM

## 2017-07-19 DIAGNOSIS — F329 Major depressive disorder, single episode, unspecified: Secondary | ICD-10-CM

## 2017-07-19 DIAGNOSIS — Z9181 History of falling: Secondary | ICD-10-CM

## 2017-07-19 DIAGNOSIS — G610 Guillain-Barre syndrome: Secondary | ICD-10-CM

## 2017-07-19 DIAGNOSIS — F419 Anxiety disorder, unspecified: Secondary | ICD-10-CM

## 2017-07-19 DIAGNOSIS — R339 Retention of urine, unspecified: Secondary | ICD-10-CM

## 2017-07-19 DIAGNOSIS — K760 Fatty (change of) liver, not elsewhere classified: Secondary | ICD-10-CM

## 2017-07-19 DIAGNOSIS — R634 Abnormal weight loss: Secondary | ICD-10-CM

## 2017-07-19 DIAGNOSIS — G8250 Quadriplegia, unspecified: Secondary | ICD-10-CM

## 2017-07-19 DIAGNOSIS — K639 Disease of intestine, unspecified: Secondary | ICD-10-CM

## 2017-07-19 DIAGNOSIS — Z79891 Long term (current) use of opiate analgesic: Secondary | ICD-10-CM

## 2017-07-19 DIAGNOSIS — M1990 Unspecified osteoarthritis, unspecified site: Secondary | ICD-10-CM

## 2017-07-19 LAB — CBC
HCT: 34.3 % — ABNORMAL LOW (ref 36.0–46.0)
HEMOGLOBIN: 11.6 g/dL — AB (ref 12.0–15.0)
MCH: 30.2 pg (ref 26.0–34.0)
MCHC: 33.8 g/dL (ref 30.0–36.0)
MCV: 89.3 fL (ref 78.0–100.0)
PLATELETS: 226 10*3/uL (ref 150–400)
RBC: 3.84 MIL/uL — AB (ref 3.87–5.11)
RDW: 15.5 % (ref 11.5–15.5)
WBC: 3.8 10*3/uL — ABNORMAL LOW (ref 4.0–10.5)

## 2017-07-19 LAB — COMPREHENSIVE METABOLIC PANEL
ALT: 17 U/L (ref 14–54)
ANION GAP: 9 (ref 5–15)
AST: 34 U/L (ref 15–41)
Albumin: 2.2 g/dL — ABNORMAL LOW (ref 3.5–5.0)
Alkaline Phosphatase: 97 U/L (ref 38–126)
BILIRUBIN TOTAL: 0.9 mg/dL (ref 0.3–1.2)
BUN: 7 mg/dL (ref 6–20)
CO2: 20 mmol/L — ABNORMAL LOW (ref 22–32)
Calcium: 7.6 mg/dL — ABNORMAL LOW (ref 8.9–10.3)
Chloride: 106 mmol/L (ref 101–111)
Creatinine, Ser: 0.49 mg/dL (ref 0.44–1.00)
Glucose, Bld: 99 mg/dL (ref 65–99)
Potassium: 3.7 mmol/L (ref 3.5–5.1)
Sodium: 135 mmol/L (ref 135–145)
TOTAL PROTEIN: 5.5 g/dL — AB (ref 6.5–8.1)

## 2017-07-19 LAB — PROTIME-INR
INR: 0.88
PROTHROMBIN TIME: 11.9 s (ref 11.4–15.2)

## 2017-07-19 LAB — TSH: TSH: 3.309 u[IU]/mL (ref 0.350–4.500)

## 2017-07-19 LAB — MAGNESIUM: MAGNESIUM: 2.2 mg/dL (ref 1.7–2.4)

## 2017-07-19 LAB — GLUCOSE, CAPILLARY: Glucose-Capillary: 104 mg/dL — ABNORMAL HIGH (ref 65–99)

## 2017-07-19 MED ORDER — PROCHLORPERAZINE EDISYLATE 5 MG/ML IJ SOLN
10.0000 mg | Freq: Four times a day (QID) | INTRAMUSCULAR | Status: DC | PRN
  Administered 2017-07-19 – 2017-07-20 (×2): 10 mg via INTRAVENOUS
  Filled 2017-07-19 (×2): qty 2

## 2017-07-19 MED ORDER — HYDROXYZINE HCL 25 MG PO TABS: 25.0000 mg | ORAL_TABLET | Freq: Three times a day (TID) | ORAL | Status: DC | PRN

## 2017-07-19 MED ORDER — HYDROXYZINE HCL 25 MG PO TABS
25.0000 mg | ORAL_TABLET | Freq: Three times a day (TID) | ORAL | Status: DC | PRN
  Administered 2017-07-19: 25 mg via ORAL
  Filled 2017-07-19 (×2): qty 1

## 2017-07-19 MED ORDER — PANTOPRAZOLE SODIUM 40 MG PO TBEC
40.0000 mg | DELAYED_RELEASE_TABLET | Freq: Every day | ORAL | Status: DC
  Administered 2017-07-19 – 2017-07-21 (×3): 40 mg via ORAL
  Filled 2017-07-19 (×3): qty 1

## 2017-07-19 MED ORDER — LEVALBUTEROL HCL 0.63 MG/3ML IN NEBU
0.6300 mg | INHALATION_SOLUTION | RESPIRATORY_TRACT | Status: DC | PRN
  Administered 2017-07-19: 0.63 mg via RESPIRATORY_TRACT
  Filled 2017-07-19: qty 3

## 2017-07-19 MED ORDER — GI COCKTAIL ~~LOC~~
30.0000 mL | Freq: Three times a day (TID) | ORAL | Status: DC | PRN
  Administered 2017-07-19 – 2017-07-20 (×2): 30 mL via ORAL
  Filled 2017-07-19 (×2): qty 30

## 2017-07-19 MED ORDER — IPRATROPIUM BROMIDE 0.02 % IN SOLN
0.5000 mg | RESPIRATORY_TRACT | Status: DC | PRN
  Administered 2017-07-19: 0.5 mg via RESPIRATORY_TRACT
  Filled 2017-07-19: qty 2.5

## 2017-07-19 MED ORDER — DEXTROSE 5 % IV SOLN
1.0000 g | INTRAVENOUS | Status: DC
  Administered 2017-07-19 – 2017-07-21 (×3): 1 g via INTRAVENOUS
  Filled 2017-07-19 (×3): qty 10

## 2017-07-19 NOTE — Progress Notes (Signed)
PROGRESS NOTE    Catherine Butler  ZOX:[id number] DOB: 1961-03-29 DOA: 07/18/2017 PCP: Loletta Specter, PA-C   Brief Narrative:  It is a 57 year old female history of diabetes, prolonged QT syndrome, Guyon Barr syndrome, quadriplegia, COPD recently discharged 1-2 weeks prior to admission presented to ED with dizziness, nausea and emesis, mid abdominal pain, hypotension and tachycardia noted at PCPs office.   Assessment & Plan:   Principal Problem:   Colonic mass Active Problems:   COPD (chronic obstructive pulmonary disease) (HCC)   Depression   Essential hypertension   Nausea & vomiting   Hypokalemia   Sinus tachycardia   Debility   Constipation   Quadriplegia and quadriparesis (HCC)   Guillain Barr syndrome (HCC)   Generalized anxiety disorder   Acute lower UTI   History of prolonged Q-T interval on ECG   Dehydration   Hypotension   Hypomagnesemia  #1 probable colonic mass Per CT abdomen and pelvis concern for colonic mass as patient with significant stool burden proximal to a relative narrowing in the mid descending colon at the level of the left iliac crest.  Patient has been seen by GI who is awaiting improvement with nausea and emesis prior to further evaluation with colonoscopy.  GI following.  2.  Nausea and vomiting Concern for possible partial obstruction secondary to problem #1.  Continue clear liquid diet.  Patient has been seen by GI and awaiting for nausea and vomiting to improve prior to further evaluation including colonoscopy.  3.  Hypotension Likely secondary to hypovolemic hypotension secondary to poor oral intake, nausea and emesis.  Patient looked clinically dry on examination on admission.  Patient is afebrile.  WBC within normal limits.  Lactic acid level within normal limits.  Doubt if infectious.  EKG with normal sinus rhythm with QT prolongation.  BP responding to IV fluids. Patient denies any chest pain.  Patient with no overt bleeding.  Continue  IV fluids.  Follow.  4.  Hypokalemia/hypomagnesemia Likely secondary to GI losses.  Replete.  5.  Dehydration IV fluids.  Patient was euvolemic by day of discharge.  6.  Guillain-Barr syndrome PT/OT.  Outpatient follow-up.  7.  Constipation Patient has been started on a fleets enema x2 and oral Dulcolax per gastroenterology.  Patient noted to have multiple stools per nursing and as such enema was never given. Per GI.  8.  COPD Stable.  9.  Sinus tachycardia Likely secondary to problem #1, 2 and 5.  Improving with hydration.  TSH at 3.309.  Supportive care.    10.  General anxiety disorder/depression No suicidal or homicidal ideations.  Outpatient follow-up.  11.  History of QT prolongation Replete electrolytes.  Keep magnesium greater than 2.  Keep potassium greater than 4.  Try to avoid QT prolongation medications.  Will discontinue Zofran and placed on Compazine as needed.  Hydroxyzine as needed.  Follow.       DVT prophylaxis: Lovenox Code Status: Full Family Communication: Updated patient.  No family at bedside. Disposition Plan: Pending workup likely home versus SNIF when medically stable.   Consultants:   Gastroenterology: Dr. Christella Hartigan 07/19/2017  Procedures:   CT abdomen and pelvis 07/18/2017    Antimicrobials:   IV Rocephin 07/19/2017     Subjective: Patient states having multiple loose stools without even getting a enema yesterday.  Patient denies any emesis.  Patient with some nausea.  Tolerating current clear liquids.  Objective: Vitals:   07/18/17 2016 07/18/17 2149 07/19/17 0500 07/19/17 0513  BP:  116/82   113/77  Pulse: (!) 113   (!) 111  Resp: 14   20  Temp: 97.9 F (36.6 C)   (!) 97.5 F (36.4 C)  TempSrc: Oral   Oral  SpO2: 97% 97%  99%  Weight:   59.8 kg (131 lb 13.4 oz)   Height:        Intake/Output Summary (Last 24 hours) at 07/19/2017 1147 Last data filed at 07/19/2017 0810 Gross per 24 hour  Intake 3600 ml  Output  200 ml  Net 3400 ml   Filed Weights   07/18/17 1227 07/18/17 1816 07/19/17 0500  Weight: 54.9 kg (121 lb) 54.9 kg (121 lb) 59.8 kg (131 lb 13.4 oz)    Examination:  General exam: NAD Respiratory system: Clear to auscultation.  No wheezes, no crackles, no rhonchi.  Respiratory effort normal. Cardiovascular system: Regular rate rhythm no murmurs rubs or gallops.  No JVD.  No pedal edema.  Gastrointestinal system: Abdomen is nondistended, soft and nontender. No organomegaly or masses felt. Normal bowel sounds heard. Central nervous system: Alert and oriented. No focal neurological deficits. Extremities: Symmetric 5 x 5 power. Skin: No rashes, lesions or ulcers Psychiatry: Judgement and insight appear normal. Mood & affect appropriate.     Data Reviewed: I have personally reviewed following labs and imaging studies  CBC: Recent Labs  Lab 07/18/17 1242 07/19/17 0539  WBC 5.9 3.8*  HGB 13.4 11.6*  HCT 38.9 34.3*  MCV 87.8 89.3  PLT 285 226   Basic Metabolic Panel: Recent Labs  Lab 07/18/17 1242 07/19/17 0539  NA 132* 135  K 3.1* 3.7  CL 96* 106  CO2 24 20*  GLUCOSE 132* 99  BUN 6 7  CREATININE 0.66 0.49  CALCIUM 8.6* 7.6*  MG 1.0* 2.2   GFR: Estimated Creatinine Clearance: 75.3 mL/min (by C-G formula based on SCr of 0.49 mg/dL). Liver Function Tests: Recent Labs  Lab 07/18/17 1242 07/19/17 0539  AST 33 34  ALT 20 17  ALKPHOS 120 97  BILITOT 0.9 0.9  PROT 6.8 5.5*  ALBUMIN 2.9* 2.2*   Recent Labs  Lab 07/18/17 1242  LIPASE 33   No results for input(s): AMMONIA in the last 168 hours. Coagulation Profile: Recent Labs  Lab 07/19/17 0539  INR 0.88   Cardiac Enzymes: No results for input(s): CKTOTAL, CKMB, CKMBINDEX, TROPONINI in the last 168 hours. BNP (last 3 results) No results for input(s): PROBNP in the last 8760 hours. HbA1C: No results for input(s): HGBA1C in the last 72 hours. CBG: Recent Labs  Lab 07/19/17 0750  GLUCAP 104*   Lipid  Profile: No results for input(s): CHOL, HDL, LDLCALC, TRIG, CHOLHDL, LDLDIRECT in the last 72 hours. Thyroid Function Tests: Recent Labs    07/19/17 0539  TSH 3.309   Anemia Panel: No results for input(s): VITAMINB12, FOLATE, FERRITIN, TIBC, IRON, RETICCTPCT in the last 72 hours. Sepsis Labs: Recent Labs  Lab 07/18/17 1403  LATICACIDVEN 0.97    Recent Results (from the past 240 hour(s))  Culture, blood (Routine X 2) w Reflex to ID Panel     Status: None (Preliminary result)   Collection Time: 07/18/17  1:35 PM  Result Value Ref Range Status   Specimen Description BLOOD RIGHT ANTECUBITAL  Final   Special Requests   Final    BOTTLES DRAWN AEROBIC AND ANAEROBIC Blood Culture adequate volume   Culture   Final    NO GROWTH < 24 HOURS Performed at [facility], [address] [place], [place] [address]    Report Status PENDING  Incomplete  Culture, blood (Routine X 2) w Reflex to ID Panel     Status: None (Preliminary result)   Collection Time: 07/18/17  3:00 PM  Result Value Ref Range Status   Specimen Description BLOOD LEFT ANTECUBITAL  Final   Special Requests   Final    BOTTLES DRAWN AEROBIC AND ANAEROBIC Blood Culture adequate volume   Culture   Final    NO GROWTH < 24 HOURS Performed at [facility], [address] [place], [place] [address]    Report Status PENDING  Incomplete         Radiology Studies: Ct Abdomen Pelvis W Contrast  Result Date: 07/18/2017 CLINICAL DATA:  Patient reports nausea, intractable vomiting since this morning. Generalized abdominal pain. Oliguria. Weight loss. History of quadriplegia, quadrant paresis. GBS. EXAM: CT ABDOMEN AND PELVIS WITH CONTRAST TECHNIQUE: Multidetector CT imaging of the abdomen and pelvis was performed using the standard protocol following bolus administration of intravenous contrast. CONTRAST:  100 cm3 Isovue-300 COMPARISON:  Plain film 07/05/2017, CT 06/20/2017 FINDINGS: Lower chest: There are dependent  changes versus scarring in the lung bases, left greater than right. Moderate hiatal hernia. Hepatobiliary: Diffuse low-attenuation of the liver without focal lesion. Gallbladder is present. Pancreas: Pancreas appears atrophic. Pancreatic calcifications are consistent with history of pancreatitis. No focal pancreatic lesions. Spleen: Normal in size without focal abnormality. Adrenals/Urinary Tract: Adrenal glands are unremarkable. Kidneys are normal, without renal calculi, focal lesion, or hydronephrosis. Bladder is unremarkable. Stomach/Bowel: Large hiatal hernia. Stomach and small bowel loops are otherwise normal in appearance. There is a large amount of stool within mildly distended large bowel loops. There is question of a relative transition zone in the level of the mid descending colon at the level of the left iliac crest on coronal image number 48 of series 4, axial image 56 of series 2. Distal to this point, there is less distension, left stool, raising the question of a stricture, possibly a malignant lesion. Vascular/Lymphatic: There is atherosclerotic calcification of the abdominal aorta not associated with aneurysm. No retroperitoneal or mesenteric adenopathy. Reproductive: Uterus is present. No adnexal mass. No free pelvic fluid. Other: None Musculoskeletal: Superior endplate fracture of T11 appears stable. No acute osseous abnormality. IMPRESSION: 1. Significant stool burden proximal to a relative narrowing in the mid descending colon at the level of the left iliac crest. This raises a question of mass in this region. Consider colonoscopy. 2. Hepatic steatosis. 3. Changes compatible with chronic pancreatitis. 4. Large hiatal hernia. 5.  Aortic atherosclerosis.  (ICD10-I70.0) 6. Chronic superior endplate fracture of T11. Electronically Signed   By: Norva PavlovElizabeth  Brown M.D.   On: 07/18/2017 15:28        Scheduled Meds: . enoxaparin (LOVENOX) injection  40 mg Subcutaneous Q24H  . feeding supplement   1 Container Oral TID BM  . gabapentin  800 mg Oral TID  . mirtazapine  15 mg Oral QHS  . nicotine  21 mg Transdermal Daily  . pantoprazole  40 mg Oral Q0600  . potassium chloride SA  20 mEq Oral Daily  . sodium chloride flush  3 mL Intravenous Q12H  . sodium phosphate  1 enema Rectal Once   Followed by  . sodium phosphate  1 enema Rectal Once   Continuous Infusions: . sodium chloride 125 mL/hr at 07/19/17 0354  . cefTRIAXone (ROCEPHIN)  IV 1 g (07/19/17 0934)     LOS: 1  day    Time spent: 35 mins    Ramiro Harvest, MD [facility] Pager [phone]  If 7PM-7AM, please contact night-coverage [email or web address] Password [facility] 07/19/2017, 11:47 AM

## 2017-07-19 NOTE — Progress Notes (Signed)
[facility]  Patient Status: Active (receiving services up to time of hospitalization)  [facility] is providing the following services: PT and OT  If patient discharges after hours, please call [phone].   Catherine EchevariaKaren Butler 07/19/2017, 10:55 AM

## 2017-07-19 NOTE — Evaluation (Signed)
Clinical/Bedside Swallow Evaluation Patient Details  Name: Catherine Butler MRN: [id number] Date of Birth: 19-Oct-1960  Today's Date: 07/19/2017 Time: SLP Start Time (ACUTE ONLY): 1151 SLP Stop Time (ACUTE ONLY): 1210 SLP Time Calculation (min) (ACUTE ONLY): 19 min  Past Medical History:  Past Medical History:  Diagnosis Date  . Anxiety   . Arthritis   . Asthma   . COPD (chronic obstructive pulmonary disease) (HCC)   . Depression   . H/O alcohol abuse   . Hypertension   . Reflux   . Suicide attempt by multiple drug overdose (HCC) 2014   Past Surgical History:  Past Surgical History:  Procedure Laterality Date  . LAPAROSCOPY    . laporscopy surgery for ovarian cyst  20 years ago  . TONSILLECTOMY     HPI:  58 yo female adm to [facility] with colonic mass - Pt PMH + for HTN, chronic constipation, ETOH, depression, prolong QT, anxiety d/o, reflux, s/p flu shot with GBS diagnosis s/p IVIG 03/2017 resulting in quadriparesis - with ongoing weakness. Pt has problem with constipation - moving bowels every day 10-14 days.     Pt has moderate-large hiatal hernia.  Pt admits to early satiety, gustatory changes resulting in weight loss - 70 pounds since fall 2018 per pt.  Swallow evaluation ordered.    Assessment / Plan / Recommendation Clinical Impression  Functional oropharyngeal swallow ability based on clinical evaluation.  Negative CN exam but pt's voice and cough appear mildly weak.  Pt did not pass 3 ounce water test as she required rest break and swallowing was followed by subtle cough.  Good tolerance of jello, water (small boluses) and po medication with water.   Pt reports issues with nausea, early satiety and gustatory changes = denies dysphagia.  Recommend to advance diet as tolerated when MD agrees.   SLP Visit Diagnosis: Dysphagia, unspecified (R13.10)    Aspiration Risk  No limitations    Diet Recommendation Regular;Thin liquid   Liquid Administration via: Straw;Cup Medication  Administration: Whole meds with liquid Supervision: Patient able to self feed Compensations: Slow rate;Small sips/bites Postural Changes: Seated upright at 90 degrees;Remain upright for at least 30 minutes after po intake    Other  Recommendations Oral Care Recommendations: Oral care BID   Follow up Recommendations None      Frequency and Duration   n/a         Prognosis   n/a    Swallow Study   General Date of Onset: 07/19/17 HPI: 57 yo female adm to [facility] with colonic mass - Pt PMH + for HTN, chronic constipation, ETOH, depression, prolong QT, anxiety d/o, reflux, s/p flu shot with GBS diagnosis s/p IVIG 03/2017 resulting in quadriparesis - with ongoing weakness. Pt has problem with constipation - moving bowels every day 10-14 days.     Pt has moderate-large hiatal hernia.  Pt admits to early satiety, gustatory changes resulting in weight loss - 70 pounds since fall 2018 per pt.  Swallow evaluation ordered.  Type of Study: Bedside Swallow Evaluation Diet Prior to this Study: (clears) Temperature Spikes Noted: No Respiratory Status: Room air History of Recent Intubation: No Behavior/Cognition: Alert;Cooperative;Pleasant mood Oral Cavity Assessment: Within Functional Limits Oral Care Completed by SLP: No Oral Cavity - Dentition: Other (Comment);Dentures, top(denture upper not placed as pt on clear liquid diet) Vision: Functional for self-feeding Self-Feeding Abilities: Able to feed self Patient Positioning: Upright in bed Baseline Vocal Quality: Low vocal intensity Volitional Cough: Strong Volitional Swallow: Able to  elicit    Oral/Motor/Sensory Function Overall Oral Motor/Sensory Function: Within functional limits   Ice Chips Ice chips: Not tested   Thin Liquid Thin Liquid: Impaired Presentation: Straw;Cup Pharyngeal  Phase Impairments: Cough - Immediate Other Comments: pt did not pass 3 ounce water test - requiring rest break and demonstrating post-swallow cough, small  boluses tolerated well    Nectar Thick Nectar Thick Liquid: Not tested   Honey Thick Honey Thick Liquid: Not tested   Puree Puree: Within functional limits Presentation: Self Fed;Spoon   Solid   GO   Solid: Not tested        Chales AbrahamsKimball, Yancy Hascall Ann 07/19/2017,1:13 PM  Donavan Burnetamara Abdirahman Chittum, MS [facility] SLP [phone]

## 2017-07-19 NOTE — Evaluation (Signed)
Physical Therapy Evaluation Patient Details Name: Catherine Butler MRN: [id number] DOB: [birth year] Today's Date: 07/19/2017   History of Present Illness  Catherine Butler is a 57 y.o. female with medical history significant of ETOH  abuse, DM, hospitalized iwth GBS in 03/2017  Prolonged QT syndrome, quadriplegia, COPD, who was recently discharged 12 days ago presented with increasing dizziness, nausea or emesis x 1 day, mid abdominal pain; CT =Changes compatible chronic pancreatitis.  Large ischial hernia, Chronic superior endplate fracture of T11,   possible colon mass  and Gastroenterology was consulted    Clinical Impression  Pt admitted with above diagnosis. Pt currently with functional limitations due to the deficits listed below (see PT Problem List). Pt significantly limited at time of eval by severe N/V/D; will continue to follow and assess for needs;   Pt will benefit from skilled PT to increase their independence and safety with mobility to allow discharge to the venue listed below.       Follow Up Recommendations Home health PT;Supervision for mobility/OOB    Equipment Recommendations  None recommended by PT    Recommendations for Other Services       Precautions / Restrictions Precautions Precautions: Fall Restrictions Weight Bearing Restrictions: No      Mobility  Bed Mobility Overal bed mobility: Needs Assistance Bed Mobility: Supine to Sit;Sit to Supine     Supine to sit: Min assist Sit to supine: Min assist   General bed mobility comments: pt performs partial turn and uses rail, HOB elevated ~45*, extra time needed and assist to bring LEs off bed and elevated trunk  Transfers Overall transfer level: Needs assistance Equipment used: Rolling walker (2 wheeled) Transfers: Sit to/from [employer] Sit to Stand: Min assist Stand pivot transfers: Min assist       General transfer comment: cues for safety and hand placement, assist to rise and  stabilize  Ambulation/Gait             General Gait Details: deferred d/t nausea  Stairs            Wheelchair Mobility    Modified Rankin (Stroke Patients Only)       Balance Overall balance assessment: Needs assistance Sitting-balance support: Feet supported;Single extremity supported;No upper extremity supported Sitting balance-Leahy Scale: Fair     Standing balance support: During functional activity;Bilateral upper extremity supported Standing balance-Leahy Scale: Poor                               Pertinent Vitals/Pain Pain Assessment: Faces Faces Pain Scale: Hurts a little bit Pain Location: no specific complaints but grimaces with movement Pain Descriptors / Indicators: Grimacing Pain Intervention(s): Monitored during session    Home Living Family/patient expects to be discharged to:: Private residence Living Arrangements: Parent Available Help at Discharge: Family;Available 24 hours/day Type of Home: House Home Access: Stairs to enter   [employer] of Steps: 3 Home Layout: One level Home Equipment: Walker - 2 wheels Additional Comments: pt was amb short househol distances with RW at time of D/C from CIR    Prior Function Level of Independence: Needs assistance   Gait / Transfers Assistance Needed: amb short household distances with RW           Hand Dominance        Extremity/Trunk Assessment   Upper Extremity Assessment Upper Extremity Assessment: Defer to OT evaluation    Lower Extremity Assessment Lower  Extremity Assessment: Generalized weakness       Communication      Cognition Arousal/Alertness: Awake/alert Behavior During Therapy: WFL for tasks assessed/performed Overall Cognitive Status: Within Functional Limits for tasks assessed                                        General Comments      Exercises     Assessment/Plan    PT Assessment Patient needs continued PT  services  PT Problem List Decreased strength;Decreased activity tolerance;Decreased balance;Decreased mobility;Decreased safety awareness       PT Treatment Interventions DME instruction;Gait training;Functional mobility training;Therapeutic exercise;Patient/family education;Therapeutic activities    PT Goals (Current goals can be found in the Care Plan section)  Acute Rehab PT Goals Patient Stated Goal: feel better PT Goal Formulation: With patient Potential to Achieve Goals: Good    Frequency Min 3X/week   Barriers to discharge        Co-evaluation               AM-PAC PT "6 Clicks" Daily Activity  Outcome Measure Difficulty turning over in bed (including adjusting bedclothes, sheets and blankets)?: A Little Difficulty moving from lying on back to sitting on the side of the bed? : Unable Difficulty sitting down on and standing up from a chair with arms (e.g., wheelchair, bedside commode, etc,.)?: Unable Help needed moving to and from a bed to chair (including a wheelchair)?: A Lot Help needed walking in hospital room?: A Lot Help needed climbing 3-5 steps with a railing? : A Lot 6 Click Score: 11    End of Session   Activity Tolerance: Treatment limited secondary to medical complications (Comment)(severe N/V) Patient left: with call bell/phone within reach;in bed;with bed alarm set   PT Visit Diagnosis: Difficulty in walking, not elsewhere classified (R26.2);Muscle weakness (generalized) (M62.81)    Time: [id number] PT Time Calculation (min) (ACUTE ONLY): 17 min   Charges:   PT Evaluation $PT Eval Low Complexity: 1 Low     PT G CodesDrucilla Butler:        Catherine Butler, PT Pager: [phone] 07/19/2017   [facility] Life CenterWILLIAMS,Mariela Rex 07/19/2017, 2:56 PM

## 2017-07-19 NOTE — Care Management Note (Signed)
Case Management Note  Patient Details  Name: Catherine Butler MRN: [id number] Date of Birth: 06/08/1961  Subjective/Objective: 57 y/o f admitted w/Colonic mass. Readmit. From home. W/family. Has rw,w/c. Active w/[facility] HHPT/OT-recc High Risk Initiative. PT cons-await recc.                   Action/Plan:d/c plan home w/HHC/HRI   Expected Discharge Date:  (unknown)               Expected Discharge Plan:  Home w Home Health Services  In-House Referral:     Discharge planning Services     Post Acute Care Choice:  Durable Medical Equipment, Home Health(rw, w/c;Active w/[facility]-HHPT/OT) Choice offered to:  Patient  DME Arranged:    DME Agency:     HH Arranged:  PT, OT, Social Work [facility]  [facility]  Status of Service:  In process, will continue to follow  If discussed at Long Length of Stay Meetings, dates discussed:    Additional Comments:  Lanier ClamMahabir, Brianni Manthe, RN 07/19/2017, 12:43 PM

## 2017-07-19 NOTE — Plan of Care (Signed)
  Progressing Education: Knowledge of General Education information will improve 07/19/2017 2243 - Progressing by Cristela FeltSteffens, Donise Woodle P, RN Clinical Measurements: Ability to maintain clinical measurements within normal limits will improve 07/19/2017 2243 - Progressing by Cristela FeltSteffens, Magdalyn Arenivas P, RN Pain Managment: General experience of comfort will improve 07/19/2017 2243 - Progressing by Cristela FeltSteffens, Elmus Mathes P, RN Skin Integrity: Risk for impaired skin integrity will decrease 07/19/2017 2243 - Progressing by Cristela FeltSteffens, Rylann Munford P, RN

## 2017-07-19 NOTE — Evaluation (Signed)
Occupational Therapy Evaluation Patient Details Name: Catherine Butler MRN: [id number] DOB: 07/20/1960 Today's Date: 07/19/2017    History of Present Illness Catherine Butler is a 57 y.o. female with medical history significant of ETOH  abuse, DM, hospitalized iwth GBS in 03/2017  Prolonged QT syndrome, quadriplegia, COPD, who was recently discharged 12 days ago presented with increasing dizziness, nausea or emesis x 1 day, mid abdominal pain; CT =Changes compatible chronic pancreatitis.  Large ischial hernia, Chronic superior endplate fracture of T11,   possible colon mass  and Gastroenterology was consulted     Clinical Impression   Pt was admitted for the above.  She was [facility] by this OT [facility]. She was limited by nausea and vomiting; trunk is much more stable than last time I saw her.  [facility]. Pt currently needs min A for SPT and up to max A  For hygiene and LB adls. Goals in acute are for min guard    Follow Up Recommendations  No OT follow up;Supervision/Assistance - 24 hour    Equipment Recommendations  None recommended by OT    Recommendations for Other Services       Precautions / Restrictions Precautions Precautions: Fall Restrictions Weight Bearing Restrictions: No      Mobility Bed Mobility Overal bed mobility: Needs Assistance Bed Mobility: Supine to Sit;Sit to Supine     Supine to sit: Min assist Sit to supine: Min assist   General bed mobility comments: pt performs partial turn and uses rail, HOB elevated ~45*, extra time needed and assist to bring LEs off bed and elevated trunk  Transfers Overall transfer level: Needs assistance Equipment used: Rolling walker (2 wheeled) Transfers: Sit to/from [employer]Stand;Stand Pivot Transfers Sit to Stand: Min assist Stand pivot transfers: Min assist       General transfer comment: cues for safety and hand placement, assist to rise and stabilize    Balance Overall balance assessment:  Needs assistance Sitting-balance support: Feet supported;Single extremity supported;No upper extremity supported Sitting balance-Leahy Scale: Fair     Standing balance support: During functional activity;Bilateral upper extremity supported Standing balance-Leahy Scale: Poor                             ADL either performed or assessed with clinical judgement   ADL Overall ADL's : Needs assistance/impaired Eating/Feeding: Set up;Sitting   Grooming: Set up;Sitting   Upper Body Bathing: Set up;Sitting   Lower Body Bathing: Maximal assistance;Sit to/from stand   Upper Body Dressing : Set up;Sitting   Lower Body Dressing: Maximal assistance;Sit to/from stand   Toilet Transfer: Minimal assistance;+2 for safety/equipment;RW;Stand-pivot   Toileting- Clothing Manipulation and Hygiene: Maximal assistance;Sit to/from stand         General ADL Comments: pt feels terrible:  nausea and vomiting during session.  pt normally reach for adls.  Has residual weakness.impaired sensation in hands     Vision         Perception     Praxis      Pertinent Vitals/Pain Pain Assessment: Faces Faces Pain Scale: Hurts a little bit Pain Location: no specific complaints but grimaces with movement Pain Descriptors / Indicators: Grimacing Pain Intervention(s): Limited activity within patient's tolerance;Monitored during session     Hand Dominance     Extremity/Trunk Assessment Upper Extremity Assessment Upper Extremity Assessment: Generalized weakness(imapaired sensation  decreased coordination)  Communication Communication Communication: No difficulties   Cognition Arousal/Alertness: Awake/alert Behavior During Therapy: WFL for tasks assessed/performed Overall Cognitive Status: Within Functional Limits for tasks assessed                                     General Comments       Exercises     Shoulder Instructions      Home Living  Family/patient expects to be discharged to:: Private residence Living Arrangements: Parent Available Help at Discharge: Family;Available 24 hours/day Type of Home: House Home Access: Stairs to enter [employer] of Steps: 3   Home Layout: One level     Bathroom Shower/Tub: Chief Strategy Officer: Standard     Home Equipment: Environmental consultant - 2 wheels   Additional Comments: pt was amb short househol distances with RW at time of D/C from CIR  Lives With: Family    Prior Functioning/Environment Level of Independence: Independent with assistive device(s)  Gait / Transfers Assistance Needed: amb short household distances with RW              OT Problem List: Decreased strength;Decreased activity tolerance;Impaired balance (sitting and/or standing);Decreased knowledge of use of DME or AE;Pain;Decreased coordination      OT Treatment/Interventions: Self-care/ADL training;Energy conservation;DME and/or AE instruction;Patient/family education;Balance training    OT Goals(Current goals can be found in the care plan section) Acute Rehab OT Goals Patient Stated Goal: feel better OT Goal Formulation: With patient Time For Goal Achievement: 07/26/17 Potential to Achieve Goals: Good ADL Goals Pt Will Transfer to Toilet: with min guard assist;ambulating;bedside commode Pt Will Perform Toileting - Clothing Manipulation and hygiene: with min guard assist;sit to/from stand Additional ADL Goal #1: pt will perform LB adls with min guard sit to stand  OT Frequency: Min 2X/week   Barriers to D/C:            Co-evaluation PT/OT/SLP Co-Evaluation/Treatment: Yes Reason for Co-Treatment: For patient/therapist safety PT goals addressed during session: Mobility/safety with mobility OT goals addressed during session: ADL's and self-care      AM-PAC PT "6 Clicks" Daily Activity     Outcome Measure Help from another person eating meals?: A Little Help from another person  taking care of personal grooming?: A Little Help from another person toileting, which includes using toliet, bedpan, or urinal?: A Lot Help from another person bathing (including washing, rinsing, drying)?: A Lot Help from another person to put on and taking off regular upper body clothing?: A Little Help from another person to put on and taking off regular lower body clothing?: A Lot 6 Click Score: 15   End of Session    Activity Tolerance: (nausea) Patient left: in bed;with call bell/phone within reach;with bed alarm set  OT Visit Diagnosis: Muscle weakness (generalized) (M62.81)                Time: 1610-9604 OT Time Calculation (min): 16 min Charges:  OT General Charges $OT Visit: 1 Visit OT Evaluation $OT Eval Low Complexity: 1 Low G-Codes:     [place], OTR/L [phone] 07/19/2017  Caio Devera 07/19/2017, 3:29 PM

## 2017-07-20 DIAGNOSIS — R933 Abnormal findings on diagnostic imaging of other parts of digestive tract: Secondary | ICD-10-CM

## 2017-07-20 DIAGNOSIS — R5381 Other malaise: Secondary | ICD-10-CM

## 2017-07-20 LAB — GLUCOSE, CAPILLARY: Glucose-Capillary: 93 mg/dL (ref 65–99)

## 2017-07-20 MED ORDER — PEG-KCL-NACL-NASULF-NA ASC-C 100 G PO SOLR
0.5000 | Freq: Once | ORAL | Status: AC
  Administered 2017-07-20: 100 g via ORAL
  Filled 2017-07-20: qty 1

## 2017-07-20 MED ORDER — PEG-KCL-NACL-NASULF-NA ASC-C 100 G PO SOLR: 1.0000 | Freq: Once | ORAL | Status: DC

## 2017-07-20 NOTE — Progress Notes (Signed)
PROGRESS NOTE    Catherine Butler  TRV:[id number] DOB: 1961-03-26 DOA: 07/18/2017 PCP: Clent Demark, PA-C    Brief Narrative:  57 year old female history of diabetes, prolonged QT syndrome, Guyon Barr syndrome, quadriplegia, COPD recently discharged 1-2 weeks prior to admission presented to ED with dizziness, nausea and emesis, mid abdominal pain, hypotension and tachycardia noted at PCPs office.   Assessment & Plan:   Principal Problem:   Colonic mass Active Problems:   COPD (chronic obstructive pulmonary disease) (HCC)   Depression   Essential hypertension   Nausea & vomiting   Hypokalemia   Sinus tachycardia   Debility   Constipation   Quadriplegia and quadriparesis (HCC)   Guillain Barr syndrome (HCC)   Generalized anxiety disorder   Acute lower UTI   History of prolonged Q-T interval on ECG   Dehydration   Hypotension   Hypomagnesemia  1. Probable colonic mass Per CT abdomen and pelvis concern for colonic mass as patient with significant stool burden proximal to a relative narrowing in the mid descending colon at the level of the left iliac crest. GI following. Patient planned for colonoscopy 1/31  2. Nausea and vomiting Concern for possible partial obstruction secondary to problem #1.  Patient reports improvement following multiple bowel movements overnight. Currently on clear liquid diet in anticipation for colon  3. Hypotension Likely secondary to hypovolemic hypotension secondary to poor oral intake, nausea and emesis. Patient looked clinically dry on examination on admission. Patient is afebrile. WBC within normal limits. Lactic acid level within normal limits. Doubt if infectious. EKG with normal sinus rhythm with QT prolongation. Improved with IVF   4. Hypokalemia/hypomagnesemia Likely secondary to GI losses. Continue to replace as needed. Repeat BMET in AM  5. Dehydration IV fluids.  Patient was euvolemic by day of discharge.  6.  Guillain-Barr syndrome PT/OT. Continue with outpatient follow-up.  7. Constipation Resolved with cathartics. GI following  8. COPD Stable.  9. Sinus tachycardia Likely secondary to problem #1, 2 and 5.  Improved.  TSH at 3.309.  Supportive care.    10. General anxiety disorder/depression Without suicidal or homicidal ideations. Outpatient follow-up.  11. History of QT prolongation Replete electrolytes. Goal to keep magnesium greater than 2. Keep potassium greater than 4.  Try to avoid QT prolongation medications.  Continue Hydroxyzine as needed.  Follow.  DVT prophylaxis: TED hose currently. Lovenox stopped by GI Code Status: Full Family Communication: Pt in room, family not at bedside Disposition Plan: Uncertain at this time  Consultants:   GI  Procedures:     Antimicrobials: Anti-infectives (From admission, onward)   Start     Dose/Rate Route Frequency Ordered Stop   07/19/17 0900  cefTRIAXone (ROCEPHIN) 1 g in dextrose 5 % 50 mL IVPB     1 g 100 mL/hr over 30 Minutes Intravenous Every 24 hours 07/19/17 0807         Subjective: Without complaints. Feels better after bowel movement  Objective: Vitals:   07/19/17 1254 07/19/17 2012 07/19/17 2214 07/20/17 0428  BP: 124/85  120/78 114/68  Pulse: (!) 109  (!) 111 (!) 110  Resp: _0 Temp: 98.3 F (36.8 C)  98.3 F (36.8 C) 98.1 F (36.7 C)  TempSrc: Oral  Oral Oral  SpO2: 100% 97% 98% 97%  Weight:    61.1 kg (134 lb 11.2 oz)  Height:        Intake/Output Summary (Last 24 hours) at 07/20/2017 1329 Last data filed at 07/20/2017  6195 Gross per 24 hour  Intake 3050 ml  Output 203 ml  Net 2847 ml   Filed Weights   07/18/17 1816 07/19/17 0500 07/20/17 0428  Weight: 54.9 kg (121 lb) 59.8 kg (131 lb 13.4 oz) 61.1 kg (134 lb 11.2 oz)    Examination:  General exam: Appears calm and comfortable  Respiratory system: Clear to auscultation. Respiratory effort normal. Cardiovascular  system: S1 & S2 heard, RRR Gastrointestinal system: Abdomen is nondistended, soft and nontender. No organomegaly or masses felt. Normal bowel sounds heard. Central nervous system: Alert and oriented. No focal neurological deficits. Extremities: Symmetric 5 x 5 power. Skin: No rashes, lesions  Psychiatry: Judgement and insight appear normal. Mood & affect appropriate.   Data Reviewed: I have personally reviewed following labs and imaging studies  CBC: Recent Labs  Lab 07/18/17 1242 07/19/17 0539  WBC 5.9 3.8*  HGB 13.4 11.6*  HCT 38.9 34.3*  MCV 87.8 89.3  PLT 285 093   Basic Metabolic Panel: Recent Labs  Lab 07/18/17 1242 07/19/17 0539  NA 132* 135  K 3.1* 3.7  CL 96* 106  CO2 24 20*  GLUCOSE 132* 99  BUN 6 7  CREATININE 0.66 0.49  CALCIUM 8.6* 7.6*  MG 1.0* 2.2   GFR: Estimated Creatinine Clearance: 75.3 mL/min (by C-G formula based on SCr of 0.49 mg/dL). Liver Function Tests: Recent Labs  Lab 07/18/17 1242 07/19/17 0539  AST 33 34  ALT 20 17  ALKPHOS 120 97  BILITOT 0.9 0.9  PROT 6.8 5.5*  ALBUMIN 2.9* 2.2*   Recent Labs  Lab 07/18/17 1242  LIPASE 33   No results for input(s): AMMONIA in the last 168 hours. Coagulation Profile: Recent Labs  Lab 07/19/17 0539  INR 0.88   Cardiac Enzymes: No results for input(s): CKTOTAL, CKMB, CKMBINDEX, TROPONINI in the last 168 hours. BNP (last 3 results) No results for input(s): PROBNP in the last 8760 hours. HbA1C: No results for input(s): HGBA1C in the last 72 hours. CBG: Recent Labs  Lab 07/19/17 0750 07/20/17 0807  GLUCAP 104* 93   Lipid Profile: No results for input(s): CHOL, HDL, LDLCALC, TRIG, CHOLHDL, LDLDIRECT in the last 72 hours. Thyroid Function Tests: Recent Labs    07/19/17 0539  TSH 3.309   Anemia Panel: No results for input(s): VITAMINB12, FOLATE, FERRITIN, TIBC, IRON, RETICCTPCT in the last 72 hours. Sepsis Labs: Recent Labs  Lab 07/18/17 1403  LATICACIDVEN 0.97    Recent  Results (from the past 240 hour(s))  Culture, blood (Routine X 2) w Reflex to ID Panel     Status: None (Preliminary result)   Collection Time: 07/18/17  1:35 PM  Result Value Ref Range Status   Specimen Description BLOOD RIGHT ANTECUBITAL  Final   Special Requests   Final    BOTTLES DRAWN AEROBIC AND ANAEROBIC Blood Culture adequate volume   Culture   Final    NO GROWTH < 24 HOURS Performed at [facility], [place] [address] [place], [place] [address]    Report Status PENDING  Incomplete  Culture, blood (Routine X 2) w Reflex to ID Panel     Status: None (Preliminary result)   Collection Time: 07/18/17  3:00 PM  Result Value Ref Range Status   Specimen Description BLOOD LEFT ANTECUBITAL  Final   Special Requests   Final    BOTTLES DRAWN AEROBIC AND ANAEROBIC Blood Culture adequate volume   Culture   Final    NO GROWTH < 24 HOURS  Performed at [facility], [place] [address], [place], [place] [address]    Report Status PENDING  Incomplete  Culture, Urine     Status: Abnormal (Preliminary result)   Collection Time: 07/18/17  9:32 PM  Result Value Ref Range Status   Specimen Description URINE, CLEAN CATCH  Final   Special Requests NONE  Final   Culture >=100,000 COLONIES/mL ENTEROBACTER AEROGENES (A)  Final   Report Status PENDING  Incomplete     Radiology Studies: Ct Abdomen Pelvis W Contrast  Result Date: 07/18/2017 CLINICAL DATA:  Patient reports nausea, intractable vomiting since this morning. Generalized abdominal pain. Oliguria. Weight loss. History of quadriplegia, quadrant paresis. GBS. EXAM: CT ABDOMEN AND PELVIS WITH CONTRAST TECHNIQUE: Multidetector CT imaging of the abdomen and pelvis was performed using the standard protocol following bolus administration of intravenous contrast. CONTRAST:  100 cm3 Isovue-300 COMPARISON:  Plain film 07/05/2017, CT 06/20/2017 FINDINGS: Lower chest: There are dependent changes versus scarring in the lung bases, left greater than  right. Moderate hiatal hernia. Hepatobiliary: Diffuse low-attenuation of the liver without focal lesion. Gallbladder is present. Pancreas: Pancreas appears atrophic. Pancreatic calcifications are consistent with history of pancreatitis. No focal pancreatic lesions. Spleen: Normal in size without focal abnormality. Adrenals/Urinary Tract: Adrenal glands are unremarkable. Kidneys are normal, without renal calculi, focal lesion, or hydronephrosis. Bladder is unremarkable. Stomach/Bowel: Large hiatal hernia. Stomach and small bowel loops are otherwise normal in appearance. There is a large amount of stool within mildly distended large bowel loops. There is question of a relative transition zone in the level of the mid descending colon at the level of the left iliac crest on coronal image number 48 of series 4, axial image 56 of series 2. Distal to this point, there is less distension, left stool, raising the question of a stricture, possibly a malignant lesion. Vascular/Lymphatic: There is atherosclerotic calcification of the abdominal aorta not associated with aneurysm. No retroperitoneal or mesenteric adenopathy. Reproductive: Uterus is present. No adnexal mass. No free pelvic fluid. Other: None Musculoskeletal: Superior endplate fracture of M03 appears stable. No acute osseous abnormality. IMPRESSION: 1. Significant stool burden proximal to a relative narrowing in the mid descending colon at the level of the left iliac crest. This raises a question of mass in this region. Consider colonoscopy. 2. Hepatic steatosis. 3. Changes compatible with chronic pancreatitis. 4. Large hiatal hernia. 5.  Aortic atherosclerosis.  (ICD10-I70.0) 6. Chronic superior endplate fracture of K91. Electronically Signed   By: Nolon Nations M.D.   On: 07/18/2017 15:28    Scheduled Meds: . feeding supplement  1 Container Oral TID BM  . gabapentin  800 mg Oral TID  . mirtazapine  15 mg Oral QHS  . nicotine  21 mg Transdermal Daily  .  pantoprazole  40 mg Oral Q0600  . peg 3350 powder  0.5 kit Oral Once   And  . peg 3350 powder  0.5 kit Oral Once  . potassium chloride SA  20 mEq Oral Daily  . sodium chloride flush  3 mL Intravenous Q12H  . sodium phosphate  1 enema Rectal Once   Followed by  . sodium phosphate  1 enema Rectal Once   Continuous Infusions: . sodium chloride 125 mL/hr at 07/20/17 0616  . cefTRIAXone (ROCEPHIN)  IV 1 g (07/20/17 1043)     LOS: 2 days   Marylu Lund, MD [facility] Pager [phone]  If 7PM-7AM, please contact night-coverage [email or web address] Password [email or web address] 07/20/2017, 1:29 PM

## 2017-07-20 NOTE — Plan of Care (Signed)
  Elimination: Will not experience complications related to bowel motility 07/20/2017 2053 - Progressing by Herbert PunAddison, Theresea Trautmann Y, RN   Activity: Risk for activity intolerance will decrease 07/20/2017 2053 - Progressing by Herbert PunAddison, Ellisa Devivo Y, RN

## 2017-07-20 NOTE — Progress Notes (Signed)
Patient ID: Catherine Butler, female   DOB: 11-Nov-1960, 57 y.o.   MRN: [id number]    Progress Note   Subjective   No nausea, vomiting since yesterday afternoon, tolerating liquids Says had multiple BM's yesterday , abdomen feel better- willing to try bowel prep   Objective   Vital signs in last 24 hours: Temp:  [98.1 F (36.7 C)-98.3 F (36.8 C)] 98.1 F (36.7 C) (01/30 0428) Pulse Rate:  [109-111] 110 (01/30 0428) Resp:  [18-20] 18 (01/30 0428) BP: (114-124)/(68-85) 114/68 (01/30 0428) SpO2:  [97 %-100 %] 97 % (01/30 0428) Weight:  [134 lb 11.2 oz (61.1 kg)] 134 lb 11.2 oz (61.1 kg) (01/30 0428) Last BM Date: 07/19/17 General:   older white female in NAD Heart:  Regular rate and rhythm; no murmurs Lungs: Respirations even and unlabored, lungs CTA bilaterally Abdomen:  Soft, nontender and nondistended. Normal bowel sounds. Extremities:  Without edema. Neurologic:  Alert and oriented,  grossly normal neurologically. Psych:  Cooperative. Normal mood and affect.  Intake/Output from previous day: 01/29 0701 - 01/30 0700 In: 3050 [I.V.:3000; IV Piggyback:50] Out: 400 [Urine:200; Emesis/NG output:200] Intake/Output this shift: Total I/O In: -  Out: 3 [Urine:3]  Lab Results: Recent Labs    07/18/17 1242 07/19/17 0539  WBC 5.9 3.8*  HGB 13.4 11.6*  HCT 38.9 34.3*  PLT 285 226   BMET Recent Labs    07/18/17 1242 07/19/17 0539  NA 132* 135  K 3.1* 3.7  CL 96* 106  CO2 24 20*  GLUCOSE 132* 99  BUN 6 7  CREATININE 0.66 0.49  CALCIUM 8.6* 7.6*   LFT Recent Labs    07/19/17 0539  PROT 5.5*  ALBUMIN 2.2*  AST 34  ALT 17  ALKPHOS 97  BILITOT 0.9   PT/INR Recent Labs    07/19/17 0539  LABPROT 11.9  INR 0.88    Studies/Results: Ct Abdomen Pelvis W Contrast  Result Date: 07/18/2017 CLINICAL DATA:  Patient reports nausea, intractable vomiting since this morning. Generalized abdominal pain. Oliguria. Weight loss. History of quadriplegia, quadrant paresis.  GBS. EXAM: CT ABDOMEN AND PELVIS WITH CONTRAST TECHNIQUE: Multidetector CT imaging of the abdomen and pelvis was performed using the standard protocol following bolus administration of intravenous contrast. CONTRAST:  100 cm3 Isovue-300 COMPARISON:  Plain film 07/05/2017, CT 06/20/2017 FINDINGS: Lower chest: There are dependent changes versus scarring in the lung bases, left greater than right. Moderate hiatal hernia. Hepatobiliary: Diffuse low-attenuation of the liver without focal lesion. Gallbladder is present. Pancreas: Pancreas appears atrophic. Pancreatic calcifications are consistent with history of pancreatitis. No focal pancreatic lesions. Spleen: Normal in size without focal abnormality. Adrenals/Urinary Tract: Adrenal glands are unremarkable. Kidneys are normal, without renal calculi, focal lesion, or hydronephrosis. Bladder is unremarkable. Stomach/Bowel: Large hiatal hernia. Stomach and small bowel loops are otherwise normal in appearance. There is a large amount of stool within mildly distended large bowel loops. There is question of a relative transition zone in the level of the mid descending colon at the level of the left iliac crest on coronal image number 48 of series 4, axial image 56 of series 2. Distal to this point, there is less distension, left stool, raising the question of a stricture, possibly a malignant lesion. Vascular/Lymphatic: There is atherosclerotic calcification of the abdominal aorta not associated with aneurysm. No retroperitoneal or mesenteric adenopathy. Reproductive: Uterus is present. No adnexal mass. No free pelvic fluid. Other: None Musculoskeletal: Superior endplate fracture of T11 appears stable. No acute osseous abnormality.  IMPRESSION: 1. Significant stool burden proximal to a relative narrowing in the mid descending colon at the level of the left iliac crest. This raises a question of mass in this region. Consider colonoscopy. 2. Hepatic steatosis. 3. Changes  compatible with chronic pancreatitis. 4. Large hiatal hernia. 5.  Aortic atherosclerosis.  (ICD10-I70.0) 6. Chronic superior endplate fracture of T11. Electronically Signed   By: Norva PavlovElizabeth  Brown M.D.   On: 07/18/2017 15:28       Assessment / Plan:    #1 57 yo female  With N/V , question partial colonic obstruction - CT raised question of colonic mass vs fecal retention  Able to tolerate clears - will continue clears today , and schedule for Colonoscopy tomorrow-bowel prep later today  Have stopped Lovenox until post colon  #2 Guillain Barre -  Hx quadriparesis - debilitated  #3 COPD  #4 chronic pancreatitis -calcifications on CT - likely ETOH induced    Contact  [name] Esterwood, P.A.-C               [phone]) [phone]   ________________________________________________________________________  [facility] MD note:  I personally examined the patient, reviewed the data and agree with the assessment and plan described above.  Prepping for colonoscopy tomorrow.     Rob Buntinganiel Jaliyah Fotheringham, MD [facility] Pager [phone]

## 2017-07-20 NOTE — Progress Notes (Signed)
Occupational Therapy Treatment Patient Details Name: NAYDELIN ZIEGLER MRN: [id number] DOB: [birth year] Today's Date: 07/20/2017    History of present illness BIDDIE SEBEK is a 57 y.o. female with medical history significant of ETOH  abuse, DM, hospitalized iwth GBS in 03/2017  Prolonged QT syndrome, quadriplegia, COPD, who was recently discharged 12 days ago presented with increasing dizziness, nausea or emesis x 1 day, mid abdominal pain; CT =Changes compatible chronic pancreatitis.  Large ischial hernia, Chronic superior endplate fracture of T11,   possible colon mass  and Gastroenterology was consulted     OT comments  Pt feeling much better than yesterday. Ambulated to bathroom and brushed teeth standing.  Walked around bed to sit up in chair  Follow Up Recommendations  No OT follow up;Supervision/Assistance - 24 hour    Equipment Recommendations  None recommended by OT    Recommendations for Other Services      Precautions / Restrictions Precautions Precautions: Fall Restrictions Weight Bearing Restrictions: No       Mobility Bed Mobility          supervision for OOB        Transfers   Equipment used: Rolling walker (2 wheeled)   Sit to Stand: Min assist         General transfer comment: light steadying assistance to stand; cues for UE placement    Balance                                           ADL either performed or assessed with clinical judgement   ADL       Grooming: Oral care;Standing;Min guard                   Toilet Transfer: Minimal assistance;Ambulation;BSC;RW             General ADL Comments: stood to brush teeth in bathroom and walked around bed to chair.  Pt feeling much better today than yesterday     Vision   Vision Assessment?: No apparent visual deficits   Perception     Praxis      Cognition Arousal/Alertness: Awake/alert Behavior During Therapy: WFL for tasks assessed/performed Overall  Cognitive Status: Within Functional Limits for tasks assessed                                          Exercises     Shoulder Instructions       General Comments      Pertinent Vitals/ Pain       Pain Assessment: No/denies pain  Home Living                                          Prior Functioning/Environment              Frequency  Min 2X/week        Progress Toward Goals  OT Goals(current goals can now be found in the care plan section)  Progress towards OT goals: Progressing toward goals     Plan      Co-evaluation                 AM-PAC  PT "6 Clicks" Daily Activity     Outcome Measure   Help from another person eating meals?: A Little Help from another person taking care of personal grooming?: A Little Help from another person toileting, which includes using toliet, bedpan, or urinal?: A Little Help from another person bathing (including washing, rinsing, drying)?: A Lot Help from another person to put on and taking off regular upper body clothing?: A Little Help from another person to put on and taking off regular lower body clothing?: A Lot 6 Click Score: 16    End of Session    OT Visit Diagnosis: Muscle weakness (generalized) (M62.81)   Activity Tolerance Patient tolerated treatment well   Patient Left in chair;with call bell/phone within reach;with chair alarm set   Nurse Communication          Time: [id number] OT Time Calculation (min): 21 min  Charges: OT General Charges $OT Visit: 1 Visit OT Treatments $Self Care/Home Management : 8-22 mins  Marica OtterMaryellen Sutton Hirsch, OTR/L [phone] 07/20/2017   Madison Albea 07/20/2017, 3:22 PM

## 2017-07-20 NOTE — Progress Notes (Signed)
Initial Nutrition Assessment  DOCUMENTATION CODES:   Severe malnutrition in context of chronic illness  INTERVENTION:  Boost Breeze po TID, each supplement provides 250kcals and 9g of protein.  Advanced diet as medically feasible. Once diet advances, RD to order Ensure Enlive po BID, each supplement provides 350kcals and 20g of protein.   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   GOAL:   Patient will meet greater than or equal to 90% of their needs   MONITOR:   PO intake, I & O's, Diet advancement, Supplement acceptance, Weight trends  REASON FOR ASSESSMENT:   Malnutrition Screening Tool    ASSESSMENT:   57 yo F admitted for N/V, colonic mass. PMHx: DM, HTN, chronic constipation, ETOH, depression, prolong QT, anxiety, reflux, s/p flu shot with GBS diagnosis s/p IVIG 03/2017 resulting in quadriparesis - ongoing weakness, moderate-large hiatal hernia, early satiety & taste changes; pt reports 75lb loss since 03/2017. Awaiting colonoscopy.  Pt reports a poor appetite but no N/V this morning. Pt reports only being able to consume 50% of jello both this morning and last night. Pt reports not being able to hold down boost breeze yesterday.   Pt reports having diarrhea all day yesterday but no bowel movement this morning. Pt reports that chronic constipation (bowel movements 1x every 1-2 weeks) is normal for her even before GB diagnosis.   Pt reports early satiety and major taste changes. Pt states that foods taste bland and she cannot tolerate meat, dairy, or eggs. Pt also experiences reflex and requested medication prior to attempting to drink boost breeze later today.   Pt reports in general PTA she eats 1-2 meals a day and only snacks sometimes. Pt reports drinking regular boost or ensure at home. Pt's doctor recommended Boost High Protein.  Dietetic intern advised patient to choose Boost Plus or Ensure Enlive because it has more calories as well as protein. Intern also encouraged meal intake in addition to supplements. Pt denies issues with self feeding although she admits it takes more concentration and she may drop items at times.   Dietetic intern encouraged pt to utilize her regular laxatives more frequently (every other day) to help with appetite as well.  Intern also encourages intake of protein like peanut butter and nuts for muscle development/PT.   Pt reports UBW before GB diagnosis was 190lb. She states a 75lb loss since November.  Per recorded weights pt was 196lb 3.4oz on 01/01/2017 and began trending downward.  Per recorded weights pt 169lb 12.1oz on 04/19/2017 (admission with GB diagnosis); 21% body weight loss, which is significant for time frame.   Medications: KCl, Rocephin, GI cocktail (Maalox, lidocaine, donnatal), protonix  Labs reviewed.    NUTRITION - FOCUSED PHYSICAL EXAM:    Most Recent Value  Orbital Region  No depletion  Upper Arm Region  No depletion  Thoracic and Lumbar Region  No depletion  Buccal Region  No depletion  Temple Region  Mild depletion  Clavicle Bone Region  No depletion  Clavicle and Acromion Bone Region  Mild depletion  Scapular Bone Region  Mild depletion  Dorsal Hand  Unable to assess  Patellar Region  Moderate depletion  Anterior Thigh Region  Moderate depletion  Posterior Calf Region  Severe depletion  Edema (RD Assessment)  None     Pt has limited functional status due to quadriplegia, which is improving.  Diet Order:  Diet clear liquid Room service appropriate? Yes; Fluid consistency: Thin Diet NPO time specified  EDUCATION NEEDS:   Education needs have been addressed  Skin:  Skin Assessment: Reviewed RN Assessment  Last BM:  1/29 type 7  Height:   Ht Readings from Last 1 Encounters:  07/18/17 5\' 6"  (1.676 m)    Weight:  Wt Readings from Last 3  Encounters:  07/20/17 134 lb 11.2 oz (61.1 kg)  07/18/17 121 lb (54.9 kg)  07/06/17 137 lb 0.4 oz (62.2 kg)    Ideal Body Weight:  59.1 kg  BMI:  Body mass index is 21.74 kg/m.  Estimated Nutritional Needs:   Kcal:  1800-2000  Protein:  80-90  Fluid:  1.8-2L    Carlin Attridge, MS, Dietetic Intern Pager # [phone]

## 2017-07-21 ENCOUNTER — Encounter: Payer: Self-pay | Admitting: Physical Medicine & Rehabilitation

## 2017-07-21 ENCOUNTER — Encounter (HOSPITAL_COMMUNITY)
Admission: EM | Disposition: A | Discharge status: Home Health | Payer: Self-pay | Source: Home / Self Care | Attending: Internal Medicine

## 2017-07-21 ENCOUNTER — Inpatient Hospital Stay (HOSPITAL_COMMUNITY): Payer: Medicaid Other | Admitting: Registered Nurse

## 2017-07-21 ENCOUNTER — Encounter (HOSPITAL_COMMUNITY): Payer: Self-pay | Admitting: Registered Nurse

## 2017-07-21 DIAGNOSIS — E43 Unspecified severe protein-calorie malnutrition: Secondary | ICD-10-CM

## 2017-07-21 HISTORY — PX: COLONOSCOPY WITH PROPOFOL: SHX5780

## 2017-07-21 LAB — BASIC METABOLIC PANEL
ANION GAP: 5 (ref 5–15)
BUN: <5 mg/dL — ABNORMAL LOW (ref 6–20)
CO2: 17 mmol/L — AB (ref 22–32)
Calcium: 7.4 mg/dL — ABNORMAL LOW (ref 8.9–10.3)
Chloride: 115 mmol/L — ABNORMAL HIGH (ref 101–111)
Creatinine, Ser: 0.36 mg/dL — ABNORMAL LOW (ref 0.44–1.00)
Glucose, Bld: 99 mg/dL (ref 65–99)
Potassium: 3.2 mmol/L — ABNORMAL LOW (ref 3.5–5.1)
SODIUM: 137 mmol/L (ref 135–145)

## 2017-07-21 LAB — URINE CULTURE

## 2017-07-21 LAB — CBC
HCT: 29 % — ABNORMAL LOW (ref 36.0–46.0)
HEMOGLOBIN: 10 g/dL — AB (ref 12.0–15.0)
MCH: 30.8 pg (ref 26.0–34.0)
MCHC: 34.5 g/dL (ref 30.0–36.0)
MCV: 89.2 fL (ref 78.0–100.0)
Platelets: 184 10*3/uL (ref 150–400)
RBC: 3.25 MIL/uL — ABNORMAL LOW (ref 3.87–5.11)
RDW: 15.5 % (ref 11.5–15.5)
WBC: 3.2 10*3/uL — ABNORMAL LOW (ref 4.0–10.5)

## 2017-07-21 SURGERY — COLONOSCOPY WITH PROPOFOL
Anesthesia: Monitor Anesthesia Care

## 2017-07-21 MED ORDER — PROPOFOL 500 MG/50ML IV EMUL
INTRAVENOUS | Status: DC | PRN
  Administered 2017-07-21: 250 ug/kg/min via INTRAVENOUS

## 2017-07-21 MED ORDER — METOPROLOL TARTRATE 25 MG PO TABS: 12.5000 mg | ORAL_TABLET | Freq: Two times a day (BID) | ORAL | 0 refills | Status: DC

## 2017-07-21 MED ORDER — SULFAMETHOXAZOLE-TRIMETHOPRIM 800-160 MG PO TABS: 1.0000 | ORAL_TABLET | Freq: Two times a day (BID) | ORAL | 0 refills | Status: DC

## 2017-07-21 MED ORDER — PROPOFOL 10 MG/ML IV BOLUS
INTRAVENOUS | Status: AC
  Filled 2017-07-21: qty 20

## 2017-07-21 MED ORDER — PROPOFOL 10 MG/ML IV BOLUS
INTRAVENOUS | Status: AC
Start: 2017-07-21 — End: 2017-07-21
  Filled 2017-07-21: qty 40

## 2017-07-21 MED ORDER — SULFAMETHOXAZOLE-TRIMETHOPRIM 800-160 MG PO TABS
1.0000 | ORAL_TABLET | Freq: Two times a day (BID) | ORAL | Status: DC
  Administered 2017-07-21: 1 via ORAL
  Filled 2017-07-21: qty 1

## 2017-07-21 MED ORDER — LACTATED RINGERS IV SOLN: INTRAVENOUS | Status: DC | PRN

## 2017-07-21 MED ORDER — POTASSIUM CHLORIDE CRYS ER 20 MEQ PO TBCR
40.0000 meq | EXTENDED_RELEASE_TABLET | Freq: Once | ORAL | Status: AC
  Administered 2017-07-21: 40 meq via ORAL
  Filled 2017-07-21: qty 2

## 2017-07-21 MED ORDER — ONDANSETRON HCL 4 MG/2ML IJ SOLN
INTRAMUSCULAR | Status: DC | PRN
  Administered 2017-07-21: 4 mg via INTRAVENOUS

## 2017-07-21 SURGICAL SUPPLY — 22 items

## 2017-07-21 NOTE — Anesthesia Procedure Notes (Signed)
Procedure Name: MAC Date/Time: 07/21/2017 8:16 AM Performed by: Lissa Morales, CRNA Pre-anesthesia Checklist: Patient identified, Emergency Drugs available, Suction available, Patient being monitored and Timeout performed Patient Re-evaluated:Patient Re-evaluated prior to induction Oxygen Delivery Method: Simple face mask Placement Confirmation: positive ETCO2

## 2017-07-21 NOTE — Progress Notes (Addendum)
Occupational Therapy Treatment Patient Details Name: Catherine Butler MRN: [id number] DOB: December 27, 1960 Today's Date: 07/21/2017    History of present illness Catherine Butler is a 57 y.o. female with medical history significant of ETOH  abuse, DM, hospitalized iwth GBS in 03/2017  Prolonged QT syndrome, quadriplegia, COPD, who was recently discharged 12 days ago presented with increasing dizziness, nausea or emesis x 1 day, mid abdominal pain; CT =Changes compatible chronic pancreatitis.  Large ischial hernia, Chronic superior endplate fracture of T11,   possible colon mass  and Gastroenterology was consulted     OT comments  Pt was a little unsteady when walking back to chair from standing grooming task but no LOB  Follow Up Recommendations  No OT follow up;Supervision/Assistance - 24 hour    Equipment Recommendations  HHOT    Recommendations for Other Services      Precautions / Restrictions Precautions Precautions: Fall Restrictions Weight Bearing Restrictions: No       Mobility Bed Mobility               General bed mobility comments: pt at eob  Transfers   Equipment used: Rolling walker (2 wheeled)   Sit to Stand: Min guard         General transfer comment: for safety    Balance             Standing balance-Leahy Scale: Poor                             ADL either performed or assessed with clinical judgement   ADL       Grooming: Oral care;Standing;Min guard;Wash/dry Lawyer: Min guard;Ambulation;BSC;RW             General ADL Comments: pt was a little unsteady when walking to chair after standing at sink for grooming. She plans to d/c home today     Vision       Perception     Praxis      Cognition Arousal/Alertness: Awake/alert Behavior During Therapy: WFL for tasks assessed/performed Overall Cognitive Status: Within Functional Limits for tasks assessed                                          Exercises     Shoulder Instructions       General Comments      Pertinent Vitals/ Pain       Pain Assessment: No/denies pain  Home Living                                          Prior Functioning/Environment              Frequency           Progress Toward Goals  OT Goals(current goals can now be found in the care plan section)  Progress towards OT goals: Progressing toward goals     Plan      Co-evaluation                 AM-PAC PT "6 Clicks" Daily Activity     Outcome Measure  Help from another person eating meals?: A Little Help from another person taking care of personal grooming?: A Little Help from another person toileting, which includes using toliet, bedpan, or urinal?: A Little Help from another person bathing (including washing, rinsing, drying)?: A Lot Help from another person to put on and taking off regular upper body clothing?: A Little Help from another person to put on and taking off regular lower body clothing?: A Lot 6 Click Score: 16    End of Session    OT Visit Diagnosis: Muscle weakness (generalized) (M62.81)   Activity Tolerance Patient tolerated treatment well   Patient Left in chair;with call bell/phone within reach;with chair alarm set   Nurse Communication          Time: [phone] OT Time Calculation (min): 9 min  Charges: OT General Charges $OT Visit: 1 Visit OT Treatments $Self Care/Home Management : 8-22 mins  Marica OtterMaryellen Armoni Depass, OTR/L [phone] 07/21/2017   Crescent Gotham 07/21/2017, 12:53 PM

## 2017-07-21 NOTE — Interval H&P Note (Signed)
History and Physical Interval Note:  07/21/2017 8:08 AM  Catherine Butler  has presented today for surgery, with the diagnosis of colon mass  The various methods of treatment have been discussed with the patient and family. After consideration of risks, benefits and other options for treatment, the patient has consented to  Procedure(s): COLONOSCOPY WITH PROPOFOL (N/A) as a surgical intervention .  The patient's history has been reviewed, patient examined, no change in status, stable for surgery.  I have reviewed the patient's chart and labs.  Questions were answered to the patient's satisfaction.     Rachael Feeaniel P Oceane Fosse

## 2017-07-21 NOTE — Anesthesia Preprocedure Evaluation (Signed)
Anesthesia Evaluation  Patient identified by MRN, date of birth, ID band Patient awake    Reviewed: Allergy & Precautions, NPO status , Patient's Chart, lab work & pertinent test results  History of Anesthesia Complications (+) PONV  Airway Mallampati: II  TM Distance: >3 FB Neck ROM: Full    Dental no notable dental hx.    Pulmonary neg pulmonary ROS, asthma , COPD, Current Smoker,    Pulmonary exam normal breath sounds clear to auscultation       Cardiovascular hypertension, negative cardio ROS Normal cardiovascular exam Rhythm:Regular Rate:Normal     Neuro/Psych Anxiety Depression negative neurological ROS  negative psych ROS   GI/Hepatic negative GI ROS, Neg liver ROS,   Endo/Other  negative endocrine ROS  Renal/GU negative Renal ROS  negative genitourinary   Musculoskeletal negative musculoskeletal ROS (+) Arthritis ,   Abdominal   Peds negative pediatric ROS (+)  Hematology negative hematology ROS (+)   Anesthesia Other Findings quadriplegia  Reproductive/Obstetrics negative OB ROS                             Anesthesia Physical Anesthesia Plan  ASA: III  Anesthesia Plan: MAC   Post-op Pain Management:    Induction: Intravenous  PONV Risk Score and Plan: 2 and Ondansetron and Midazolam  Airway Management Planned: Simple Face Mask  Additional Equipment:   Intra-op Plan:   Post-operative Plan:   Informed Consent: I have reviewed the patients History and Physical, chart, labs and discussed the procedure including the risks, benefits and alternatives for the proposed anesthesia with the patient or authorized representative who has indicated his/her understanding and acceptance.   Dental advisory given  Plan Discussed with: CRNA  Anesthesia Plan Comments:         Anesthesia Quick Evaluation

## 2017-07-21 NOTE — Transfer of Care (Signed)
Immediate Anesthesia Transfer of Care Note  Patient: Catherine Butler  Procedure(s) Performed: COLONOSCOPY WITH PROPOFOL (N/A )  Patient Location: PACU  Anesthesia Type:MAC  Level of Consciousness: awake, alert , oriented and patient cooperative  Airway & Oxygen Therapy: Patient Spontanous Breathing and Patient connected to face mask oxygen  Post-op Assessment: Report given to RN and Post -op Vital signs reviewed and stable  Post vital signs: stable  Last Vitals:  Vitals:   07/21/17 0740 07/21/17 0841  BP: (!) 147/101   Pulse: (!) 115   Resp: (!) 21   Temp: 36.6 C 36.8 C  SpO2: 99%     Last Pain:  Vitals:   07/21/17 0841  TempSrc: Oral  PainSc:       Patients Stated Pain Goal: 4 (29/19/16 6060)  Complications: No apparent anesthesia complications

## 2017-07-21 NOTE — Anesthesia Postprocedure Evaluation (Signed)
Anesthesia Post Note  Patient: Catherine Butler  Procedure(s) Performed: COLONOSCOPY WITH PROPOFOL (N/A )     Patient location during evaluation: PACU Anesthesia Type: MAC Level of consciousness: awake and alert Pain management: pain level controlled Vital Signs Assessment: post-procedure vital signs reviewed and stable Respiratory status: spontaneous breathing, nonlabored ventilation and respiratory function stable Cardiovascular status: stable and blood pressure returned to baseline Postop Assessment: no apparent nausea or vomiting Anesthetic complications: no    Last Vitals:  Vitals:   07/21/17 0845 07/21/17 0850  BP: 125/87 122/86  Pulse: (!) 115 (!) 110  Resp: 20 19  Temp:    SpO2: 100% 100%    Last Pain:  Vitals:   07/21/17 0841  TempSrc: Oral  PainSc:                  Lynda Rainwater

## 2017-07-21 NOTE — Care Management Note (Signed)
Case Management Note  Patient Details  Name: Catherine Butler MRN: [id number] Date of Birth: 05/27/1961  Subjective/Objective: Active w/[facility]-awaiting HHPT/OT orders prior d/c-Attending notified.                   Action/Plan:d/c home w/[facility]HC-[facility]   Expected Discharge Date:  07/21/17               Expected Discharge Plan:  Home w Home Health Services  In-House Referral:     Discharge planning Services     Post Acute Care Choice:  Durable Medical Equipment, Home Health(rw, w/c;Active w/[facility]-HHPT/OT) Choice offered to:  Patient  DME Arranged:    DME Agency:     HH Arranged:  PT, OT, Social Work [employer]  [facility]  Status of Service:  Completed, signed off  If discussed at [employer] Meetings, dates discussed:    Additional Comments:  Lanier ClamMahabir, Garrus Gauthreaux, RN 07/21/2017, 12:31 PM

## 2017-07-21 NOTE — Op Note (Signed)
[facility] Patient Name: Catherine Butler Procedure Date: 07/21/2017 MRN: [id number] Attending MD: Rachael Fee , MD Date of Birth: 21-Oct-1960 CSN: [id number] Age: 57 Admit Type: Inpatient Procedure:                Colonoscopy Indications:              Abnormal CT of the GI tract; suggested possible                            mass in the descending colon; has chronic                            constipation Providers:                Rachael Fee, MD, Madalyn Rob, Technician,                            Kandice Robinsons, Technician, Leandrew Koyanagi, RN Referring MD:              Medicines:                Monitored Anesthesia Care Complications:            No immediate complications. Estimated blood loss:                            None. Estimated Blood Loss:     Estimated blood loss: none. Procedure:                Pre-Anesthesia Assessment:                           - Prior to the procedure, a History and Physical                            was performed, and patient medications and                            allergies were reviewed. The patient's tolerance of                            previous anesthesia was also reviewed. The risks                            and benefits of the procedure and the sedation                            options and risks were discussed with the patient.                            All questions were answered, and informed consent                            was obtained. Prior Anticoagulants: The patient has                            taken no  previous anticoagulant or antiplatelet                            agents. ASA Grade Assessment: II - A patient with                            mild systemic disease. After reviewing the risks                            and benefits, the patient was deemed in                            satisfactory condition to undergo the procedure.                           After obtaining informed consent, the  colonoscope                            was passed under direct vision. Throughout the                            procedure, the patient's blood pressure, pulse, and                            oxygen saturations were monitored continuously. The                            Colonoscope was introduced through the anus and                            advanced to the the cecum, identified by                            appendiceal orifice and ileocecal valve. The                            colonoscopy was performed without difficulty. The                            patient tolerated the procedure well. The quality                            of the bowel preparation was good. The ileocecal                            valve, appendiceal orifice, and rectum were                            photographed. Scope In: 8:20:55 AM Scope Out: 8:34:12 AM Scope Withdrawal Time: 0 hours 5 minutes 33 seconds  Total Procedure Duration: 0 hours 13 minutes 17 seconds  Findings:      The entire examined colon appeared normal on direct and retroflexion       views.      No polyps or cancers. Impression:               -  The entire examined colon is normal on direct and                            retroflexion views.                           - No polyps or cancers. Moderate Sedation:      N/A- Per Anesthesia Care Recommendation:           - Return patient to hospital ward for possible                            discharge same day.                           - Advance diet as tolerated.                           - Continue present medications. You should try                            daily OTC fiber supplements for your chronic                            constipation.                           - Return to see GI as needed.                           - Colon cancer screening in 10 years (consider                            colonoscopy at that time) Procedure Code(s):        --- Professional ---                            [id number], Colonoscopy, flexible; diagnostic, including                            collection of specimen(s) by brushing or washing,                            when performed (separate procedure) Diagnosis Code(s):        --- Professional ---                           R93.3, Abnormal findings on diagnostic imaging of                            other parts of digestive tract CPT copyright 2016 American [employer]. All rights reserved. The codes documented in this report are preliminary and upon coder review may  be revised to meet current compliance requirements. Rachael Feeaniel P Brynnly Bonet, MD 07/21/2017 8:40:04 AM This report has been signed electronically. Number of Addenda: 0

## 2017-07-21 NOTE — Discharge Summary (Addendum)
Physician Discharge Summary  Catherine Butler RUE:[id number] DOB: 25-Apr-1961 DOA: 07/18/2017  PCP: Loletta Specter, PA-C  Admit date: 07/18/2017 Discharge date: 07/21/2017  Admitted From: Home Disposition:  Home  Recommendations for Outpatient Follow-up:  1. Follow up with PCP in 1-2 weeks 2. Follow up with Urology as scheduled  Discharge Condition:Improved CODE STATUS:Full Diet recommendation: Regular   Brief/Interim Summary: 57 year old female history of diabetes, prolonged QT syndrome, Guyon Barr syndrome, quadriplegia, COPD recently discharged 1-2 weeks prior to admission presented to ED with dizziness, nausea and emesis, mid abdominal pain, hypotension and tachycardia noted at PCPs office.  1. Probable colonic mass Per CT abdomen and pelvis concern for colonic mass as patient withsignificant stool burden proximal to a relative narrowing in the mid descending colon at the level of the left iliac crest. GI following. Patient underwent colonoscopy on 1/31, noted to have normal findings  2. Nausea and vomiting Concern for possible partial obstruction secondary to problem #1.Patient reports improvement following multiple bowel movements overnight. Tolerating regular diet  3. Hypotension Likely secondary to hypovolemic hypotension secondary to poor oral intake, nausea and emesis. Patient lookedclinically dry on examinationon admission. Patient is afebrile. WBC within normal limits. Lactic acid level within normal limits. Doubt if infectious. EKG with normal sinus rhythm with QT prolongation. Improved with IVF   4. Hypokalemia/hypomagnesemia Likely secondary to GI losses. Continue to replace as needed. Repeat BMET in AM  5. Dehydration IV fluids.Patient was euvolemic by day of discharge.  6. Guillain-Barr syndrome PT/OT. Continue with outpatient follow-up.  7. Constipation Resolved with cathartics. GI following  8. COPD Stable.  9. Sinus  tachycardia Likely secondary to problem #1,2 and 5. Improved. TSH at 3.309. Patient reports feeling anxious. Will give low dose of metoprolol. Recommend close outpatient follow up.   10. General anxiety disorder/depression Without suicidal or homicidal ideations. Outpatient follow-up.  11. History of QT prolongation Replete electrolytes. Goal to keep magnesium greater than 2. Keep potassium greater than 4.Continue to avoid QT prolongation medications. .   Discharge Diagnoses:  Principal Problem:   Colonic mass Active Problems:   COPD (chronic obstructive pulmonary disease) (HCC)   Depression   Essential hypertension   Nausea & vomiting   Hypokalemia   Sinus tachycardia   Debility   Constipation   Quadriplegia and quadriparesis (HCC)   Guillain Barr syndrome (HCC)   Generalized anxiety disorder   Acute lower UTI   History of prolonged Q-T interval on ECG   Dehydration   Hypotension   Hypomagnesemia   Protein-calorie malnutrition, severe    Discharge Instructions   Allergies as of 07/21/2017      Reactions   Lipitor [atorvastatin] Other (See Comments)   "really bad leg pain"      Medication List    TAKE these medications   acetaminophen 500 MG tablet Commonly known as:  TYLENOL Take 1,000 mg by mouth 2 (two) times daily as needed for headache.   albuterol 108 (90 Base) MCG/ACT inhaler Commonly known as:  PROVENTIL HFA;VENTOLIN HFA Inhale 2 puffs into the lungs every 6 (six) hours as needed for wheezing.   gabapentin 400 MG capsule Commonly known as:  NEURONTIN Take 2 capsules (800 mg total) by mouth 3 (three) times daily.   methocarbamol 500 MG tablet Commonly known as:  ROBAXIN Take 1 tablet (500 mg total) by mouth every 6 (six) hours as needed for muscle spasms.   mirtazapine 15 MG tablet Commonly known as:  REMERON Take 15 mg by mouth at  bedtime.   polyethylene glycol packet Commonly known as:  MIRALAX / GLYCOLAX Take 17 g by mouth 2  (two) times daily. What changed:    when to take this  reasons to take this   potassium chloride SA 20 MEQ tablet Commonly known as:  K-DUR,KLOR-CON Take 1 tablet (20 mEq total) by mouth daily.   QUEtiapine 400 MG tablet Commonly known as:  SEROQUEL Take 400 mg by mouth at bedtime.   senna-docusate 8.6-50 MG tablet Commonly known as:  Senokot-S Take 2 tablets by mouth 2 (two) times daily. What changed:    when to take this  reasons to take this   sulfamethoxazole-trimethoprim 800-160 MG tablet Commonly known as:  BACTRIM DS,SEPTRA DS Take 1 tablet by mouth every 12 (twelve) hours.   traMADol 50 MG tablet Commonly known as:  ULTRAM Take 1 tablet (50 mg total) by mouth every 6 (six) hours as needed for moderate pain.      Follow-up Information    Loletta Specter, PA-C. Schedule an appointment as soon as possible for a visit.   Specialty:  Physician Assistant Contact information: [address] [place] [address] [phone]        Rene Paci, MD Follow up.   Specialty:  Urology Why:  Follow up as scheduled  Contact information: [address] [place] [address] [phone]          Allergies  Allergen Reactions  . Lipitor [Atorvastatin] Other (See Comments)    "really bad leg pain"    Consultations:  GI  Procedures/Studies: Ct Abdomen Pelvis W Contrast  Result Date: 07/18/2017 CLINICAL DATA:  Patient reports nausea, intractable vomiting since this morning. Generalized abdominal pain. Oliguria. Weight loss. History of quadriplegia, quadrant paresis. GBS. EXAM: CT ABDOMEN AND PELVIS WITH CONTRAST TECHNIQUE: Multidetector CT imaging of the abdomen and pelvis was performed using the standard protocol following bolus administration of intravenous contrast. CONTRAST:  100 cm3 Isovue-300 COMPARISON:  Plain film 07/05/2017, CT 06/20/2017 FINDINGS: Lower chest: There are dependent changes versus scarring in the lung  bases, left greater than right. Moderate hiatal hernia. Hepatobiliary: Diffuse low-attenuation of the liver without focal lesion. Gallbladder is present. Pancreas: Pancreas appears atrophic. Pancreatic calcifications are consistent with history of pancreatitis. No focal pancreatic lesions. Spleen: Normal in size without focal abnormality. Adrenals/Urinary Tract: Adrenal glands are unremarkable. Kidneys are normal, without renal calculi, focal lesion, or hydronephrosis. Bladder is unremarkable. Stomach/Bowel: Large hiatal hernia. Stomach and small bowel loops are otherwise normal in appearance. There is a large amount of stool within mildly distended large bowel loops. There is question of a relative transition zone in the level of the mid descending colon at the level of the left iliac crest on coronal image number 48 of series 4, axial image 56 of series 2. Distal to this point, there is less distension, left stool, raising the question of a stricture, possibly a malignant lesion. Vascular/Lymphatic: There is atherosclerotic calcification of the abdominal aorta not associated with aneurysm. No retroperitoneal or mesenteric adenopathy. Reproductive: Uterus is present. No adnexal mass. No free pelvic fluid. Other: None Musculoskeletal: Superior endplate fracture of T11 appears stable. No acute osseous abnormality. IMPRESSION: 1. Significant stool burden proximal to a relative narrowing in the mid descending colon at the level of the left iliac crest. This raises a question of mass in this region. Consider colonoscopy. 2. Hepatic steatosis. 3. Changes compatible with chronic pancreatitis. 4. Large hiatal hernia. 5.  Aortic atherosclerosis.  (  ICD10-I70.0) 6. Chronic superior endplate fracture of T11. Electronically Signed   By: Norva Pavlov M.D.   On: 07/18/2017 15:28   Dg Abd Portable 1v  Result Date: 07/05/2017 CLINICAL DATA:  Nausea and vomiting. EXAM: PORTABLE ABDOMEN - 1 VIEW COMPARISON:  06/23/2017  FINDINGS: Bowel gas pattern demonstrates several air-filled large and small bowel loops. There are a few air-filled prominent small bowel loops in the left abdomen without significant dilatation. No evidence of mass or mass effect. No free peritoneal air. Remainder the exam is unchanged. IMPRESSION: Nonspecific, nonobstructive bowel gas pattern. Electronically Signed   By: Elberta Fortis M.D.   On: 07/05/2017 16:05   Dg Abd Portable 1v  Result Date: 06/23/2017 CLINICAL DATA:  Constipation. EXAM: PORTABLE ABDOMEN - 1 VIEW COMPARISON:  06/20/2017 CT FINDINGS: A moderate to large amount of stool within the ascending and descending colon noted. Small amount of stool in the sigmoid colon and rectum noted. No dilated small bowel loops are present. No suspicious bony lesions are identified. IMPRESSION: Moderate to large amount of stool within the ascending and descending colon which can be seen with constipation. No evidence of bowel obstruction. Electronically Signed   By: Harmon Pier M.D.   On: 06/23/2017 20:24    Subjective: Very eager to go home  Discharge Exam: Vitals:   07/21/17 0845 07/21/17 0850  BP: 125/87 122/86  Pulse: (!) 115 (!) 110  Resp: 20 19  Temp:    SpO2: 100% 100%   Vitals:   07/21/17 0740 07/21/17 0841 07/21/17 0845 07/21/17 0850  BP: (!) 147/101 (!) 123/110 125/87 122/86  Pulse: (!) 115 (!) 115 (!) 115 (!) 110  Resp: (!) 21 20 20 19   Temp: 97.9 F (36.6 C) 98.2 F (36.8 C)    TempSrc: Oral Oral    SpO2: 99% 100% 100% 100%  Weight: 64 kg (141 lb)     Height: 5\' 6"  (1.676 m)       General: Pt is alert, awake, not in acute distress Cardiovascular: RRR, S1/S2 +, no rubs, no gallops Respiratory: CTA bilaterally, no wheezing, no rhonchi Abdominal: Soft, NT, ND, bowel sounds + Extremities: no edema, no cyanosis   The results of significant diagnostics from this hospitalization (including imaging, microbiology, ancillary and laboratory) are listed below for reference.      Microbiology: Recent Results (from the past 240 hour(s))  Culture, blood (Routine X 2) w Reflex to ID Panel     Status: None (Preliminary result)   Collection Time: 07/18/17  1:35 PM  Result Value Ref Range Status   Specimen Description BLOOD RIGHT ANTECUBITAL  Final   Special Requests   Final    BOTTLES DRAWN AEROBIC AND ANAEROBIC Blood Culture adequate volume   Culture   Final    NO GROWTH 2 DAYS Performed at [facility], [address] [place], [place] [address]    Report Status PENDING  Incomplete  Culture, blood (Routine X 2) w Reflex to ID Panel     Status: None (Preliminary result)   Collection Time: 07/18/17  3:00 PM  Result Value Ref Range Status   Specimen Description BLOOD LEFT ANTECUBITAL  Final   Special Requests   Final    BOTTLES DRAWN AEROBIC AND ANAEROBIC Blood Culture adequate volume   Culture   Final    NO GROWTH 2 DAYS Performed at [facility], [address] [place], [place] [address]    Report Status PENDING  Incomplete  Culture, Urine  Status: Abnormal   Collection Time: 07/18/17  9:32 PM  Result Value Ref Range Status   Specimen Description URINE, CLEAN CATCH  Final   Special Requests NONE  Final   Culture >=100,000 COLONIES/mL ENTEROBACTER AEROGENES (A)  Final   Report Status 07/21/2017 FINAL  Final   Organism ID, Bacteria ENTEROBACTER AEROGENES (A)  Final      Susceptibility   Enterobacter aerogenes - MIC*    CEFAZOLIN >=64 RESISTANT Resistant     CEFTRIAXONE 32 INTERMEDIATE Intermediate     CIPROFLOXACIN <=0.25 SENSITIVE Sensitive     GENTAMICIN <=1 SENSITIVE Sensitive     IMIPENEM 1 SENSITIVE Sensitive     NITROFURANTOIN 128 RESISTANT Resistant     TRIMETH/SULFA <=20 SENSITIVE Sensitive     PIP/TAZO >=128 RESISTANT Resistant     * >=100,000 COLONIES/mL ENTEROBACTER AEROGENES     Labs: BNP (last 3 results) No results for input(s): BNP in the last 8760 hours. Basic Metabolic Panel: Recent Labs  Lab 07/18/17 1242  07/19/17 0539 07/21/17 0600  NA 132* 135 137  K 3.1* 3.7 3.2*  CL 96* 106 115*  CO2 24 20* 17*  GLUCOSE 132* 99 99  BUN 6 7 <5*  CREATININE 0.66 0.49 0.36*  CALCIUM 8.6* 7.6* 7.4*  MG 1.0* 2.2  --    Liver Function Tests: Recent Labs  Lab 07/18/17 1242 07/19/17 0539  AST 33 34  ALT 20 17  ALKPHOS 120 97  BILITOT 0.9 0.9  PROT 6.8 5.5*  ALBUMIN 2.9* 2.2*   Recent Labs  Lab 07/18/17 1242  LIPASE 33   No results for input(s): AMMONIA in the last 168 hours. CBC: Recent Labs  Lab 07/18/17 1242 07/19/17 0539 07/21/17 0600  WBC 5.9 3.8* 3.2*  HGB 13.4 11.6* 10.0*  HCT 38.9 34.3* 29.0*  MCV 87.8 89.3 89.2  PLT 285 226 184   Cardiac Enzymes: No results for input(s): CKTOTAL, CKMB, CKMBINDEX, TROPONINI in the last 168 hours. BNP: Invalid input(s): POCBNP CBG: Recent Labs  Lab 07/19/17 0750 07/20/17 0807  GLUCAP 104* 93   D-Dimer No results for input(s): DDIMER in the last 72 hours. Hgb A1c No results for input(s): HGBA1C in the last 72 hours. Lipid Profile No results for input(s): CHOL, HDL, LDLCALC, TRIG, CHOLHDL, LDLDIRECT in the last 72 hours. Thyroid function studies Recent Labs    07/19/17 0539  TSH 3.309   Anemia work up No results for input(s): VITAMINB12, FOLATE, FERRITIN, TIBC, IRON, RETICCTPCT in the last 72 hours. Urinalysis    Component Value Date/Time   COLORURINE YELLOW 07/18/2017 2132   APPEARANCEUR HAZY (A) 07/18/2017 2132   LABSPEC >1.046 (H) 07/18/2017 2132   PHURINE 6.0 07/18/2017 2132   GLUCOSEU NEGATIVE 07/18/2017 2132   HGBUR NEGATIVE 07/18/2017 2132   BILIRUBINUR NEGATIVE 07/18/2017 2132   KETONESUR NEGATIVE 07/18/2017 2132   PROTEINUR NEGATIVE 07/18/2017 2132   UROBILINOGEN 0.2 09/13/2012 0124   NITRITE POSITIVE (A) 07/18/2017 2132   LEUKOCYTESUR LARGE (A) 07/18/2017 2132   Sepsis Labs Invalid input(s): PROCALCITONIN,  WBC,  LACTICIDVEN Microbiology Recent Results (from the past 240 hour(s))  Culture, blood (Routine  X 2) w Reflex to ID Panel     Status: None (Preliminary result)   Collection Time: 07/18/17  1:35 PM  Result Value Ref Range Status   Specimen Description BLOOD RIGHT ANTECUBITAL  Final   Special Requests   Final    BOTTLES DRAWN AEROBIC AND ANAEROBIC Blood Culture adequate volume   Culture   Final  NO GROWTH 2 DAYS Performed at [facility], [address], [place], [place] [address]    Report Status PENDING  Incomplete  Culture, blood (Routine X 2) w Reflex to ID Panel     Status: None (Preliminary result)   Collection Time: 07/18/17  3:00 PM  Result Value Ref Range Status   Specimen Description BLOOD LEFT ANTECUBITAL  Final   Special Requests   Final    BOTTLES DRAWN AEROBIC AND ANAEROBIC Blood Culture adequate volume   Culture   Final    NO GROWTH 2 DAYS Performed at [facility], [address] [place], [place] [address]    Report Status PENDING  Incomplete  Culture, Urine     Status: Abnormal   Collection Time: 07/18/17  9:32 PM  Result Value Ref Range Status   Specimen Description URINE, CLEAN CATCH  Final   Special Requests NONE  Final   Culture >=100,000 COLONIES/mL ENTEROBACTER AEROGENES (A)  Final   Report Status 07/21/2017 FINAL  Final   Organism ID, Bacteria ENTEROBACTER AEROGENES (A)  Final      Susceptibility   Enterobacter aerogenes - MIC*    CEFAZOLIN >=64 RESISTANT Resistant     CEFTRIAXONE 32 INTERMEDIATE Intermediate     CIPROFLOXACIN <=0.25 SENSITIVE Sensitive     GENTAMICIN <=1 SENSITIVE Sensitive     IMIPENEM 1 SENSITIVE Sensitive     NITROFURANTOIN 128 RESISTANT Resistant     TRIMETH/SULFA <=20 SENSITIVE Sensitive     PIP/TAZO >=128 RESISTANT Resistant     * >=100,000 COLONIES/mL ENTEROBACTER AEROGENES     SIGNED:   Rickey Barbara, MD  [facility] 07/21/2017, 11:50 AM  If 7PM-7AM, please contact night-coverage [email or web address] Password [email or web address]

## 2017-07-22 ENCOUNTER — Telehealth (INDEPENDENT_AMBULATORY_CARE_PROVIDER_SITE_OTHER): Payer: Self-pay | Admitting: Physician Assistant

## 2017-07-22 NOTE — Telephone Encounter (Signed)
Please confirm if you agree with PT and OT. I will contact amy and provide a verbal.

## 2017-07-22 NOTE — Telephone Encounter (Signed)
[facility]  Called because Mrs Catherine Butler she get out of the hospital and they order Butler & OT and she wants to know if PA Catherine Butler is ok with  1 week - 1  3 Weeks 3,  2 Weeks 1 you can call her at  [phone] Thank you

## 2017-07-23 LAB — CULTURE, BLOOD (ROUTINE X 2)
Culture: NO GROWTH
Culture: NO GROWTH
SPECIAL REQUESTS: ADEQUATE
SPECIAL REQUESTS: ADEQUATE

## 2017-07-25 ENCOUNTER — Telehealth (INDEPENDENT_AMBULATORY_CARE_PROVIDER_SITE_OTHER): Payer: Self-pay | Admitting: Physician Assistant

## 2017-07-25 NOTE — Telephone Encounter (Signed)
She may stop metoprolol.

## 2017-07-25 NOTE — Telephone Encounter (Signed)
Please advise on this concern.

## 2017-07-25 NOTE — Telephone Encounter (Signed)
[name] Catherine Butler Bp is very low  She mention that in the hospital they give her a medicine to low her heart rate . Please, call the patient   [phone]

## 2017-07-25 NOTE — Telephone Encounter (Signed)
Patient verified DOB Patient is aware of PCP advising her to stop the metoprolol and follow up in the clinic Patient was transferred to the front to schedule appointment.

## 2017-07-25 NOTE — Telephone Encounter (Signed)
I have called and left message approving 2 weeks of physical therapy. I have asked for a call back if there are any questions.

## 2017-07-28 ENCOUNTER — Ambulatory Visit (INDEPENDENT_AMBULATORY_CARE_PROVIDER_SITE_OTHER): Payer: Medicaid Other | Admitting: Physician Assistant

## 2017-07-28 ENCOUNTER — Encounter (INDEPENDENT_AMBULATORY_CARE_PROVIDER_SITE_OTHER): Payer: Self-pay | Admitting: Physician Assistant

## 2017-07-28 VITALS — BP 88/57 | HR 132 | Temp 98.0°F | Resp 20 | Ht 67.0 in | Wt 123.0 lb

## 2017-07-28 DIAGNOSIS — Z1231 Encounter for screening mammogram for malignant neoplasm of breast: Secondary | ICD-10-CM

## 2017-07-28 DIAGNOSIS — Z1239 Encounter for other screening for malignant neoplasm of breast: Secondary | ICD-10-CM

## 2017-07-28 DIAGNOSIS — E876 Hypokalemia: Secondary | ICD-10-CM | POA: Diagnosis not present

## 2017-07-28 DIAGNOSIS — R112 Nausea with vomiting, unspecified: Secondary | ICD-10-CM | POA: Diagnosis not present

## 2017-07-28 DIAGNOSIS — E861 Hypovolemia: Secondary | ICD-10-CM

## 2017-07-28 DIAGNOSIS — D649 Anemia, unspecified: Secondary | ICD-10-CM

## 2017-07-28 DIAGNOSIS — I959 Hypotension, unspecified: Secondary | ICD-10-CM

## 2017-07-28 DIAGNOSIS — R1900 Intra-abdominal and pelvic swelling, mass and lump, unspecified site: Secondary | ICD-10-CM | POA: Diagnosis not present

## 2017-07-28 DIAGNOSIS — Z124 Encounter for screening for malignant neoplasm of cervix: Secondary | ICD-10-CM

## 2017-07-28 MED ORDER — POTASSIUM CHLORIDE ER 10 MEQ PO TBCR: 10.0000 meq | EXTENDED_RELEASE_TABLET | Freq: Every day | ORAL | 0 refills | Status: DC

## 2017-07-28 MED ORDER — ONDANSETRON 4 MG PO TBDP
4.0000 mg | ORAL_TABLET | Freq: Once | ORAL | Status: AC
  Administered 2017-07-28: 4 mg via ORAL

## 2017-07-28 MED ORDER — FERROUS SULFATE 325 (65 FE) MG PO TABS: 325.0000 mg | ORAL_TABLET | Freq: Every day | ORAL | 0 refills | Status: DC

## 2017-07-28 MED ORDER — ENSURE COMPLETE PO LIQD: 237.0000 mL | Freq: Two times a day (BID) | ORAL | 1 refills | Status: DC

## 2017-07-28 MED ORDER — CALCIUM CARB-CHOLECALCIFEROL 600-800 MG-UNIT PO TABS: 1.0000 | ORAL_TABLET | Freq: Every day | ORAL | 0 refills | Status: DC

## 2017-07-28 MED ORDER — ONDANSETRON 8 MG PO TBDP: 8.0000 mg | ORAL_TABLET | Freq: Three times a day (TID) | ORAL | 0 refills | Status: DC | PRN

## 2017-07-28 NOTE — Patient Instructions (Signed)
Hypotension As your heart beats, it forces blood through your body. This force is called blood pressure. If you have hypotension, you have low blood pressure. When your blood pressure is too low, you may not get enough blood to your brain. You may feel weak, feel light-headed, have a fast heartbeat, or even pass out (faint). Follow these instructions at home: Eating and drinking  Drink enough fluids to keep your pee (urine) clear or pale yellow.  Eat a healthy diet, and follow instructions from your doctor about eating or drinking restrictions. A healthy diet includes: ? Fresh fruits and vegetables. ? Whole grains. ? Low-fat (lean) meats. ? Low-fat dairy products.  Eat extra salt only as told. Do not add extra salt to your diet unless your doctor tells you to.  Eat small meals often.  Avoid standing up quickly after you eat. Medicines  Take over-the-counter and prescription medicines only as told by your doctor. ? Follow instructions from your doctor about changing how much you take (the dosage) of your medicines, if this applies. ? Do not stop or change your medicine on your own. General instructions  Wear compression stockings as told by your doctor.  Get up slowly from lying down or sitting.  Avoid hot showers and a lot of heat as told by your doctor.  Return to your normal activities as told by your doctor. Ask what activities are safe for you.  Do not use any products that contain nicotine or tobacco, such as cigarettes and e-cigarettes. If you need help quitting, ask your doctor.  Keep all follow-up visits as told by your doctor. This is important. Contact a doctor if:  You throw up (vomit).  You have watery poop (diarrhea).  You have a fever for more than 2-3 days.  You feel more thirsty than normal.  You feel weak and tired. Get help right away if:  You have chest pain.  You have a fast or irregular heartbeat.  You lose feeling (get numbness) in any part  of your body.  You cannot move your arms or your legs.  You have trouble talking.  You get sweaty or feel light-headed.  You faint.  You have trouble breathing.  You have trouble staying awake.  You feel confused. This information is not intended to replace advice given to you by your health care provider. Make sure you discuss any questions you have with your health care provider. Document Released: 09/01/2009 Document Revised: 02/24/2016 Document Reviewed: 02/24/2016 Elsevier Interactive Patient Education  2017 [employer]   

## 2017-07-28 NOTE — Progress Notes (Signed)
Subjective:  Patient ID: Catherine Butler, female    DOB: 12/04/1960  Age: 57 y.o. MRN: [id number]  CC: Blood pressure check  HPI  `Catherine Butler a 57 y.o.femalewith a PMH of anxiety, asthma, HTN arthritis, COPD, depression, suicide attempt by multiple drug overdose, and acid reflux presents for hospital f/u. She was sent to the ED from this office on 07/18/17. She was admitted the same day and discharged on 07/21/17. 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 Patient had colonoscopy done which was entirely normal on report. Patient states she feels moderately better since discharge. Main complaint is fatigue, feeling cold, and some abdominal discomfort. Has been trying to drink more water and has been drinking Gatorade. Still nauseated but controlled with Zofran. BP is noted to be low today. Does not endorse CP, palpitations, SOB, HA, rash, dizziness, fever, chills, or GI/GU sxs.     Outpatient Medications Prior to Visit  Medication Sig Dispense Refill  . acetaminophen (TYLENOL) 500 MG tablet Take 1,000 mg by mouth 2 (two) times daily as needed for headache.    . albuterol (PROVENTIL HFA;VENTOLIN HFA) 108 (90 Base) MCG/ACT inhaler Inhale 2 puffs into the lungs every 6 (six) hours as needed for wheezing. 1 Inhaler 0  . gabapentin (NEURONTIN) 400 MG capsule Take 2 capsules (800 mg total) by mouth 3 (three) times daily. 180 capsule 0  . methocarbamol (ROBAXIN) 500 MG tablet Take 1 tablet (500 mg total) by mouth every 6 (six) hours as needed for muscle spasms. 120 tablet 0  . metoprolol tartrate (LOPRESSOR) 25 MG tablet Take 0.5 tablets (12.5 mg total) by mouth 2 (two) times daily. 60 tablet 0  . mirtazapine (REMERON) 15 MG tablet Take 15 mg by mouth at bedtime.    . polyethylene glycol (MIRALAX / GLYCOLAX) packet Take 17 g by mouth 2 (two) times daily. (Patient  taking differently: Take 17 g by mouth daily as needed for mild constipation. ) 60 each 0  . potassium chloride SA (K-DUR,KLOR-CON) 20 MEQ tablet Take 1 tablet (20 mEq total) by mouth daily. (Patient not taking: Reported on 07/18/2017) 3 tablet 0  . QUEtiapine (SEROQUEL) 400 MG tablet Take 400 mg by mouth at bedtime.    . senna-docusate (SENOKOT-S) 8.6-50 MG tablet Take 2 tablets by mouth 2 (two) times daily. (Patient taking differently: Take 2 tablets by mouth 2 (two) times daily as needed for mild constipation. ) 120 tablet 0  . sulfamethoxazole-trimethoprim (BACTRIM DS,SEPTRA DS) 800-160 MG tablet Take 1 tablet by mouth every 12 (twelve) hours. 10 tablet 0  . traMADol (ULTRAM) 50 MG tablet Take 1 tablet (50 mg total) by mouth every 6 (six) hours as needed for moderate pain. (Patient not taking: Reported on 07/18/2017) 30 tablet 0   No facility-administered medications prior to visit.      ROS Review of Systems  Constitutional: Positive for malaise/fatigue. Negative for chills and fever.  Eyes: Negative for blurred vision.  Respiratory: Negative for shortness of breath.   Cardiovascular: Negative for chest pain and palpitations.  Gastrointestinal: Positive for abdominal pain. Negative for nausea.  Genitourinary: Negative for dysuria and hematuria.  Musculoskeletal: Negative for joint pain and myalgias.  Skin: Negative for rash.  Neurological: Negative for tingling and headaches.  Endo/Heme/Allergies:       Feeling cold  Psychiatric/Behavioral: Negative for depression. The patient is not nervous/anxious.     Objective:  BP (!) 88/57 (  BP Location: Left Arm, Patient Position: Sitting, Cuff Size: Normal)   Pulse (!) 132   Temp 98 F (36.7 C) (Oral)   Resp 20   Ht 5\' 7"  (1.702 m)   Wt 123 lb (55.8 kg)   LMP 02/22/2006   SpO2 97%   BMI 19.26 kg/m   BP/Weight 07/28/2017 07/21/2017 07/18/2017  Systolic BP 88 122 -  Diastolic BP 57 72 -  Wt. (Lbs) 123 141 -  BMI 19.26 - 22.76       Physical Exam  Constitutional: She is oriented to person, place, and time.  Well developed, thin, NAD, appears moderately better than previous visit, sitting on wheelchair, polite  HENT:  Head: Normocephalic and atraumatic.  Eyes: Conjunctivae are normal. No scleral icterus.  Neck: Normal range of motion. Neck supple. No thyromegaly present.  Cardiovascular: Regular rhythm and normal heart sounds. Exam reveals no gallop and no friction rub.  No murmur heard. Somewhat tachycardic  Pulmonary/Chest: Effort normal and breath sounds normal. No respiratory distress. She has no wheezes. She has no rales.  Abdominal: Soft. Bowel sounds are normal. She exhibits no distension. There is tenderness (mild epigastric TTP).  Musculoskeletal: She exhibits no edema.  Neurological: She is alert and oriented to person, place, and time. No cranial nerve deficit. Coordination normal.  Skin: Skin is warm and dry. No rash noted. No erythema. There is pallor.  Psychiatric: She has a normal mood and affect. Her behavior is normal. Thought content normal.  Vitals reviewed.    Assessment & Plan:    1. Hypotension, unspecified hypotension type - feeding supplement, ENSURE COMPLETE, (ENSURE COMPLETE) LIQD; Take 237 mLs by mouth 2 (two) times daily between meals.  Dispense: 30 Bottle; Refill: 1  2. Hypovolemia - feeding supplement, ENSURE COMPLETE, (ENSURE COMPLETE) LIQD; Take 237 mLs by mouth 2 (two) times daily between meals.  Dispense: 30 Bottle; Refill: 1 - Basic Metabolic Panel  3. Nausea and vomiting, intractability of vomiting not specified, unspecified vomiting type - ondansetron (ZOFRAN-ODT) disintegrating tablet 4 mg - ondansetron (ZOFRAN-ODT) 8 MG disintegrating tablet; Take 1 tablet (8 mg total) by mouth every 8 (eight) hours as needed for nausea or vomiting.  Dispense: 20 tablet; Refill: 0 - CBC with Differential  4. Hypokalemia - potassium chloride (K-DUR) 10 MEQ tablet; Take 1 tablet  (10 mEq total) by mouth daily.  Dispense: 30 tablet; Refill: 0 - Basic Metabolic Panel  5. Anemia, unspecified type - ferrous sulfate 325 (65 FE) MG tablet; Take 1 tablet (325 mg total) by mouth daily with breakfast.  Dispense: 30 tablet; Refill: 0 - CBC with Differential  6. Hypocalcemia - Calcium Carb-Cholecalciferol (CALTRATE 600+D) 600-800 MG-UNIT TABS; Take 1 tablet by mouth daily.  Dispense: 30 tablet; Refill: 0 - Basic Metabolic Panel  7. Intra-abdominal and pelvic swelling, mass and lump, unspecified site - MR Abdomen W Wo Contrast; Future  8. Screening for cervical cancer - Ambulatory referral to Gynecology  9. Screening for breast cancer - MS DIGITAL SCREENING BILATERAL; Future   Meds ordered this encounter  Medications  . ondansetron (ZOFRAN-ODT) disintegrating tablet 4 mg  . feeding supplement, ENSURE COMPLETE, (ENSURE COMPLETE) LIQD    Sig: Take 237 mLs by mouth 2 (two) times daily between meals.    Dispense:  30 Bottle    Refill:  1    Order Specific Question:   Supervising Provider    Answer:   Quentin Angst [id number]  . ferrous sulfate 325 (65 FE) MG tablet  Sig: Take 1 tablet (325 mg total) by mouth daily with breakfast.    Dispense:  30 tablet    Refill:  0    Order Specific Question:   Supervising Provider    Answer:   Quentin Angst [id number]  . potassium chloride (K-DUR) 10 MEQ tablet    Sig: Take 1 tablet (10 mEq total) by mouth daily.    Dispense:  30 tablet    Refill:  0    Order Specific Question:   Supervising Provider    Answer:   Quentin Angst [id number]  . ondansetron (ZOFRAN-ODT) 8 MG disintegrating tablet    Sig: Take 1 tablet (8 mg total) by mouth every 8 (eight) hours as needed for nausea or vomiting.    Dispense:  20 tablet    Refill:  0    Order Specific Question:   Supervising Provider    Answer:   Quentin Angst [id number]  . Calcium Carb-Cholecalciferol (CALTRATE 600+D) 600-800 MG-UNIT TABS    Sig: Take 1  tablet by mouth daily.    Dispense:  30 tablet    Refill:  0    Order Specific Question:   Supervising Provider    Answer:   Quentin Angst [id number]    Follow-up: Return in about 3 weeks (around 08/18/2017) for electrolytes and anemia.   Catherine Specter PA

## 2017-07-29 LAB — CBC WITH DIFFERENTIAL/PLATELET
BASOS ABS: 0.1 10*3/uL (ref 0.0–0.2)
Basos: 2 %
EOS (ABSOLUTE): 0 10*3/uL (ref 0.0–0.4)
Eos: 1 %
Hematocrit: 37.2 % (ref 34.0–46.6)
Hemoglobin: 12.6 g/dL (ref 11.1–15.9)
IMMATURE GRANS (ABS): 0 10*3/uL (ref 0.0–0.1)
Immature Granulocytes: 0 %
LYMPHS ABS: 1.7 10*3/uL (ref 0.7–3.1)
LYMPHS: 51 %
MCH: 29.9 pg (ref 26.6–33.0)
MCHC: 33.9 g/dL (ref 31.5–35.7)
MCV: 88 fL (ref 79–97)
MONOS ABS: 0.5 10*3/uL (ref 0.1–0.9)
Monocytes: 14 %
NEUTROS ABS: 1.1 10*3/uL — AB (ref 1.4–7.0)
Neutrophils: 32 %
PLATELETS: 247 10*3/uL (ref 150–379)
RBC: 4.22 x10E6/uL (ref 3.77–5.28)
RDW: 16.2 % — AB (ref 12.3–15.4)
WBC: 3.3 10*3/uL — ABNORMAL LOW (ref 3.4–10.8)

## 2017-07-29 LAB — BASIC METABOLIC PANEL
BUN / CREAT RATIO: 13 (ref 9–23)
BUN: 6 mg/dL (ref 6–24)
CHLORIDE: 103 mmol/L (ref 96–106)
CO2: 19 mmol/L — ABNORMAL LOW (ref 20–29)
Calcium: 8.6 mg/dL — ABNORMAL LOW (ref 8.7–10.2)
Creatinine, Ser: 0.47 mg/dL — ABNORMAL LOW (ref 0.57–1.00)
GFR calc non Af Amer: 112 mL/min/{1.73_m2} (ref >59)
GFR, EST AFRICAN AMERICAN: 130 mL/min/{1.73_m2} (ref >59)
GLUCOSE: 129 mg/dL — AB (ref 65–99)
POTASSIUM: 3.9 mmol/L (ref 3.5–5.2)
SODIUM: 139 mmol/L (ref 134–144)

## 2017-07-29 NOTE — Telephone Encounter (Signed)
-----   Message from Loletta Specteroger David Gomez, PA-C sent at 07/29/2017 11:12 AM EST ----- Patient no longer anemic. Calcium almost back to normal. Potassium is normal. May take her calcium pills. Take one iron pill per day. Hold off on potassium pills for now.

## 2017-07-29 NOTE — Telephone Encounter (Signed)
Patient verified DOB Patient is aware of no longer being anemic and potassium being back to normal. Patient advised to take daily calcium and iron to continue addressing slightly low calcium. Patient advised to hold off on potassium. Patient states she feels much better today. No further questions at this time.

## 2017-08-02 ENCOUNTER — Other Ambulatory Visit (INDEPENDENT_AMBULATORY_CARE_PROVIDER_SITE_OTHER): Payer: Self-pay | Admitting: Physician Assistant

## 2017-08-02 ENCOUNTER — Ambulatory Visit (HOSPITAL_COMMUNITY)
Admission: RE | Admit: 2017-08-02 | Discharge: 2017-08-02 | Disposition: A | Discharge status: Home / Self Care | Payer: Medicaid Other | Source: Ambulatory Visit | Attending: Physician Assistant | Admitting: Physician Assistant

## 2017-08-02 DIAGNOSIS — R1900 Intra-abdominal and pelvic swelling, mass and lump, unspecified site: Secondary | ICD-10-CM

## 2017-08-02 DIAGNOSIS — R112 Nausea with vomiting, unspecified: Secondary | ICD-10-CM

## 2017-08-09 ENCOUNTER — Encounter (HOSPITAL_COMMUNITY): Payer: Self-pay | Admitting: Emergency Medicine

## 2017-08-09 ENCOUNTER — Encounter (INDEPENDENT_AMBULATORY_CARE_PROVIDER_SITE_OTHER): Payer: Self-pay | Admitting: Physician Assistant

## 2017-08-09 ENCOUNTER — Emergency Department (HOSPITAL_COMMUNITY): Payer: Medicaid Other

## 2017-08-09 ENCOUNTER — Ambulatory Visit (INDEPENDENT_AMBULATORY_CARE_PROVIDER_SITE_OTHER): Payer: Medicaid Other | Admitting: Physician Assistant

## 2017-08-09 ENCOUNTER — Emergency Department (HOSPITAL_COMMUNITY)
Admission: EM | Admit: 2017-08-09 | Discharge: 2017-08-09 | Disposition: A | Discharge status: Home / Self Care | Payer: Medicaid Other | Attending: Emergency Medicine | Admitting: Emergency Medicine

## 2017-08-09 VITALS — BP 93/67 | HR 125 | Temp 97.6°F | Resp 18 | Ht 68.0 in | Wt 124.0 lb

## 2017-08-09 DIAGNOSIS — R0602 Shortness of breath: Secondary | ICD-10-CM

## 2017-08-09 DIAGNOSIS — R110 Nausea: Secondary | ICD-10-CM | POA: Insufficient documentation

## 2017-08-09 DIAGNOSIS — K5900 Constipation, unspecified: Secondary | ICD-10-CM | POA: Diagnosis not present

## 2017-08-09 DIAGNOSIS — J449 Chronic obstructive pulmonary disease, unspecified: Secondary | ICD-10-CM | POA: Diagnosis not present

## 2017-08-09 DIAGNOSIS — R531 Weakness: Secondary | ICD-10-CM

## 2017-08-09 DIAGNOSIS — R112 Nausea with vomiting, unspecified: Secondary | ICD-10-CM | POA: Diagnosis not present

## 2017-08-09 DIAGNOSIS — R002 Palpitations: Secondary | ICD-10-CM | POA: Diagnosis not present

## 2017-08-09 DIAGNOSIS — R1900 Intra-abdominal and pelvic swelling, mass and lump, unspecified site: Secondary | ICD-10-CM | POA: Diagnosis not present

## 2017-08-09 DIAGNOSIS — F17210 Nicotine dependence, cigarettes, uncomplicated: Secondary | ICD-10-CM | POA: Insufficient documentation

## 2017-08-09 DIAGNOSIS — Z79899 Other long term (current) drug therapy: Secondary | ICD-10-CM | POA: Diagnosis not present

## 2017-08-09 DIAGNOSIS — K529 Noninfective gastroenteritis and colitis, unspecified: Secondary | ICD-10-CM | POA: Diagnosis not present

## 2017-08-09 DIAGNOSIS — R1084 Generalized abdominal pain: Secondary | ICD-10-CM | POA: Diagnosis not present

## 2017-08-09 DIAGNOSIS — E876 Hypokalemia: Secondary | ICD-10-CM | POA: Insufficient documentation

## 2017-08-09 DIAGNOSIS — R42 Dizziness and giddiness: Secondary | ICD-10-CM

## 2017-08-09 DIAGNOSIS — I959 Hypotension, unspecified: Secondary | ICD-10-CM

## 2017-08-09 DIAGNOSIS — R000 Tachycardia, unspecified: Secondary | ICD-10-CM

## 2017-08-09 LAB — COMPREHENSIVE METABOLIC PANEL
ALT: 32 U/L (ref 14–54)
AST: 46 U/L — ABNORMAL HIGH (ref 15–41)
Albumin: 2.6 g/dL — ABNORMAL LOW (ref 3.5–5.0)
Alkaline Phosphatase: 105 U/L (ref 38–126)
Anion gap: 14 (ref 5–15)
BUN: 7 mg/dL (ref 6–20)
CO2: 19 mmol/L — ABNORMAL LOW (ref 22–32)
Calcium: 7.8 mg/dL — ABNORMAL LOW (ref 8.9–10.3)
Chloride: 102 mmol/L (ref 101–111)
Creatinine, Ser: 0.54 mg/dL (ref 0.44–1.00)
GFR calc Af Amer: >60 mL/min (ref >60)
GFR calc non Af Amer: >60 mL/min (ref >60)
Glucose, Bld: 119 mg/dL — ABNORMAL HIGH (ref 65–99)
Potassium: 2.8 mmol/L — ABNORMAL LOW (ref 3.5–5.1)
Sodium: 135 mmol/L (ref 135–145)
Total Bilirubin: 0.8 mg/dL (ref 0.3–1.2)
Total Protein: 5.9 g/dL — ABNORMAL LOW (ref 6.5–8.1)

## 2017-08-09 LAB — URINALYSIS, ROUTINE W REFLEX MICROSCOPIC
Bilirubin Urine: NEGATIVE
Glucose, UA: NEGATIVE mg/dL
Hgb urine dipstick: NEGATIVE
Ketones, ur: NEGATIVE mg/dL
Leukocytes, UA: NEGATIVE
Nitrite: NEGATIVE
Protein, ur: NEGATIVE mg/dL
Specific Gravity, Urine: 1.02 (ref 1.005–1.030)
pH: 7 (ref 5.0–8.0)

## 2017-08-09 LAB — CBC WITH DIFFERENTIAL/PLATELET
Basophils Absolute: 0.1 10*3/uL (ref 0.0–0.1)
Basophils Relative: 1 %
Eosinophils Absolute: 0 10*3/uL (ref 0.0–0.7)
Eosinophils Relative: 1 %
HCT: 35 % — ABNORMAL LOW (ref 36.0–46.0)
Hemoglobin: 12.4 g/dL (ref 12.0–15.0)
Lymphocytes Relative: 57 %
Lymphs Abs: 2.4 10*3/uL (ref 0.7–4.0)
MCH: 31 pg (ref 26.0–34.0)
MCHC: 35.4 g/dL (ref 30.0–36.0)
MCV: 87.5 fL (ref 78.0–100.0)
Monocytes Absolute: 0.5 10*3/uL (ref 0.1–1.0)
Monocytes Relative: 12 %
Neutro Abs: 1.2 10*3/uL — ABNORMAL LOW (ref 1.7–7.7)
Neutrophils Relative %: 29 %
Platelets: 287 10*3/uL (ref 150–400)
RBC: 4 MIL/uL (ref 3.87–5.11)
RDW: 16.3 % — ABNORMAL HIGH (ref 11.5–15.5)
WBC: 4.2 10*3/uL (ref 4.0–10.5)

## 2017-08-09 LAB — LIPASE, BLOOD: Lipase: 34 U/L (ref 11–51)

## 2017-08-09 LAB — CBG MONITORING, ED: GLUCOSE-CAPILLARY: 104 mg/dL — AB (ref 65–99)

## 2017-08-09 MED ORDER — MORPHINE SULFATE (PF) 4 MG/ML IV SOLN
4.0000 mg | Freq: Once | INTRAVENOUS | Status: AC
  Administered 2017-08-09: 4 mg via INTRAVENOUS
  Filled 2017-08-09: qty 1

## 2017-08-09 MED ORDER — MAGNESIUM SULFATE 50 % IJ SOLN: 2.0000 g | Freq: Once | INTRAMUSCULAR | Status: DC

## 2017-08-09 MED ORDER — POTASSIUM CHLORIDE CRYS ER 20 MEQ PO TBCR
60.0000 meq | EXTENDED_RELEASE_TABLET | Freq: Once | ORAL | Status: AC
  Administered 2017-08-09: 60 meq via ORAL
  Filled 2017-08-09: qty 3

## 2017-08-09 MED ORDER — SODIUM CHLORIDE 0.9 % IJ SOLN
INTRAMUSCULAR | Status: AC
  Filled 2017-08-09: qty 50

## 2017-08-09 MED ORDER — IOPAMIDOL (ISOVUE-300) INJECTION 61%
INTRAVENOUS | Status: AC
  Administered 2017-08-09: 100 mL
  Filled 2017-08-09: qty 100

## 2017-08-09 MED ORDER — MAGNESIUM SULFATE 2 GM/50ML IV SOLN
2.0000 g | Freq: Once | INTRAVENOUS | Status: AC
  Administered 2017-08-09: 2 g via INTRAVENOUS
  Filled 2017-08-09: qty 50

## 2017-08-09 MED ORDER — SODIUM CHLORIDE 0.9 % IV BOLUS (SEPSIS)
1000.0000 mL | Freq: Once | INTRAVENOUS | Status: AC
  Administered 2017-08-09: 1000 mL via INTRAVENOUS

## 2017-08-09 MED ORDER — ONDANSETRON HCL 4 MG/2ML IJ SOLN
4.0000 mg | Freq: Once | INTRAMUSCULAR | Status: AC
Start: 2017-08-09 — End: 2017-08-09
  Administered 2017-08-09: 4 mg via INTRAVENOUS
  Filled 2017-08-09: qty 2

## 2017-08-09 MED ORDER — KETOROLAC TROMETHAMINE 15 MG/ML IJ SOLN
15.0000 mg | Freq: Once | INTRAMUSCULAR | Status: AC
  Administered 2017-08-09: 15 mg via INTRAVENOUS
  Filled 2017-08-09: qty 1

## 2017-08-09 MED ORDER — ONDANSETRON HCL 4 MG PO TABS: 4.0000 mg | ORAL_TABLET | Freq: Four times a day (QID) | ORAL | 0 refills | Status: DC

## 2017-08-09 NOTE — ED Notes (Signed)
ED Provider at bedside. 

## 2017-08-09 NOTE — ED Triage Notes (Signed)
Patient sent from PCP for evaluation for lab work to identify why she has hypotension and tachycardia. Patient had abd mass found and wil likely need another one to identify the mass.

## 2017-08-09 NOTE — ED Notes (Signed)
BLOOD CULTURE X 1 EACH LEFT FOREARM COLLECTED

## 2017-08-09 NOTE — ED Notes (Signed)
BLOOD CULTURE  X2 COLLECTED 5 ML EACH RT FOREARM BY WENDY ASSIT DIRT RN.

## 2017-08-09 NOTE — Progress Notes (Signed)
Subjective:  Patient ID: Catherine Butler, female    DOB: 1961/06/09  Age: 57 y.o. MRN: [id number]  CC: f/u  HPI Catherine Butler a 57 y.o.femalewith a PMH of anxiety, asthma,HTNarthritis, COPD, depression, suicide attempt by multiple drug overdose,and acid reflux presents on f/u of electrolyte abnormalities, nausea, vomiting, and colonic mass. MRI with contrast originally ordered to help differentiate colonic mass but radiology requested a change to CT abdomen and pelvis with contrast. Order was changed but patient could not make it to her appointment due to transportation issues. There is still no answer to what the previously imaged colonic mass may be. Patient endorses constant abdominal pain that feels "like a rope is tied around me". There is associated nausea, vomiting, constipation (no BM in 5 days), SOB, weakness, lightheadedness, tachycardia, hypotension, tachycardia, and palpitations.     Outpatient Medications Prior to Visit  Medication Sig Dispense Refill  . acetaminophen (TYLENOL) 500 MG tablet Take 1,000 mg by mouth 2 (two) times daily as needed for headache.    . albuterol (PROVENTIL HFA;VENTOLIN HFA) 108 (90 Base) MCG/ACT inhaler Inhale 2 puffs into the lungs every 6 (six) hours as needed for wheezing. 1 Inhaler 0  . Calcium Carb-Cholecalciferol (CALTRATE 600+D) 600-800 MG-UNIT TABS Take 1 tablet by mouth daily. 30 tablet 0  . feeding supplement, ENSURE COMPLETE, (ENSURE COMPLETE) LIQD Take 237 mLs by mouth 2 (two) times daily between meals. 30 Bottle 1  . ferrous sulfate 325 (65 FE) MG tablet Take 1 tablet (325 mg total) by mouth daily with breakfast. 30 tablet 0  . gabapentin (NEURONTIN) 400 MG capsule Take 2 capsules (800 mg total) by mouth 3 (three) times daily. 180 capsule 0  . methocarbamol (ROBAXIN) 500 MG tablet Take 1 tablet (500 mg total) by mouth every 6 (six) hours as needed for muscle spasms. 120 tablet 0  . metoprolol tartrate (LOPRESSOR) 25 MG tablet Take 0.5  tablets (12.5 mg total) by mouth 2 (two) times daily. 60 tablet 0  . mirtazapine (REMERON) 15 MG tablet Take 15 mg by mouth at bedtime.    . ondansetron (ZOFRAN-ODT) 8 MG disintegrating tablet Take 1 tablet (8 mg total) by mouth every 8 (eight) hours as needed for nausea or vomiting. 20 tablet 0  . polyethylene glycol (MIRALAX / GLYCOLAX) packet Take 17 g by mouth 2 (two) times daily. (Patient taking differently: Take 17 g by mouth daily as needed for mild constipation. ) 60 each 0  . potassium chloride (K-DUR) 10 MEQ tablet Take 1 tablet (10 mEq total) by mouth daily. 30 tablet 0  . QUEtiapine (SEROQUEL) 400 MG tablet Take 400 mg by mouth at bedtime.    . sulfamethoxazole-trimethoprim (BACTRIM DS,SEPTRA DS) 800-160 MG tablet Take 1 tablet by mouth every 12 (twelve) hours. 10 tablet 0  . traMADol (ULTRAM) 50 MG tablet Take 1 tablet (50 mg total) by mouth every 6 (six) hours as needed for moderate pain. (Patient not taking: Reported on 07/18/2017) 30 tablet 0   No facility-administered medications prior to visit.      ROS Review of Systems  Constitutional: Positive for malaise/fatigue. Negative for chills and fever.  Eyes: Negative for blurred vision.  Respiratory: Positive for shortness of breath.   Cardiovascular: Positive for palpitations. Negative for chest pain.  Gastrointestinal: Positive for abdominal pain, constipation, nausea and vomiting. Negative for blood in stool.  Genitourinary: Negative for dysuria and hematuria.  Musculoskeletal: Negative for joint pain and myalgias.  Skin: Negative for rash.  Neurological:  Positive for dizziness, weakness and headaches. Negative for tingling.  Psychiatric/Behavioral: Negative for depression. The patient is not nervous/anxious.     Objective:  BP 93/67 (BP Location: Left Arm, Patient Position: Sitting, Cuff Size: Normal)   Pulse (!) 125   Temp 97.6 F (36.4 C) (Oral)   Resp 18   Ht '5\' 8"'$  (1.727 m)   Wt 124 lb (56.2 kg)   LMP  02/22/2006   SpO2 100%   BMI 18.85 kg/m   BP/Weight 08/09/2017 07/28/2017 09/17/1914  Systolic BP 93 88 606  Diastolic BP 67 57 72  Wt. (Lbs) 124 123 141  BMI 18.85 19.26 -      Physical Exam  Constitutional: She is oriented to person, place, and time.  Appears slightly better than previous visit but still very weak, thin, mildly labored breathing, polite  HENT:  Head: Normocephalic and atraumatic.  Eyes: No scleral icterus.  Neck: Normal range of motion.  Cardiovascular: Regular rhythm and normal heart sounds.  No murmur heard. tachycardic  Pulmonary/Chest: Effort normal and breath sounds normal. No respiratory distress. She has no wheezes.  Abdominal: Soft. Bowel sounds are normal. She exhibits no distension and no mass. There is tenderness (generalized TTP but greater in the RUQ and LUQ). There is no rebound and no guarding.  Musculoskeletal: She exhibits no edema.  Neurological: She is alert and oriented to person, place, and time. No cranial nerve deficit.  Skin: Skin is dry. No rash noted. No erythema. No pallor.  Cool lower extremities. Somewhat pale.  Psychiatric: She has a normal mood and affect. Her behavior is normal. Thought content normal.  Vitals reviewed.    Assessment & Plan:    1. Generalized abdominal pain  2. Abdominal mass, unspecified abdominal location  3. Non-intractable vomiting with nausea, unspecified vomiting type  4. Constipation, unspecified constipation type  5. Hypotension, unspecified hypotension type  6. Tachycardia  7. Palpitations  8. Shortness of breath  9. Weakness  10. Lightheadedness   *Pt has been sent to the ED as there has not been satisfactory recovery since her last visit here. She continues with a myriad of symptoms to include an undifferentiated colonic mass that is yet to be re-imaged. Patient's health status is precarious given appearance, vitals, and symptomatology. Needs emergent work up to include CT abdomen and  pelvis with contrast to evaluate colonic mass.   Follow-up: Return if symptoms worsen or fail to improve, for after ED.   Clent Demark PA

## 2017-08-09 NOTE — Patient Instructions (Signed)
I strongly advise that you go to the emergency room for evaluation of your abdominal pain. You had an unidentifiable abdominal mass found on previous CT imaging and will likely need another CT abdomen to help identify the mass. You will also need STAT labs to identify dangerous causes of your hypotension and tachycardia.

## 2017-08-09 NOTE — ED Notes (Signed)
AWARE OF NEED FOR URINE SAMPLE 

## 2017-08-11 NOTE — ED Provider Notes (Signed)
[facility]-EMERGENCY DEPT Provider Note   CSN: [id number] Arrival date & time: 08/09/17  1613     History   Chief Complaint Chief Complaint  Patient presents with  . Hypotension  . Tachycardia    HPI Catherine Butler is a 57 y.o. female.  HPI  56yF with abdominal pain. Recent admission with abdominal pain, n/v and electrolyte abnormalities. Seen as outpt in FU and referred to ED. Continues to feel nauseated. Band like abdominal pain. Waxes and wanes. No urinary complaints. No fever or chills.   Past Medical History:  Diagnosis Date  . Anxiety   . Arthritis   . Asthma   . Complication of anesthesia    nausea  . COPD (chronic obstructive pulmonary disease) (HCC)   . Depression   . H/O alcohol abuse   . Hypertension   . PONV (postoperative nausea and vomiting)   . Reflux   . Suicide attempt by multiple drug overdose [facility]) 2014    Patient Active Problem List   Diagnosis Date Noted  . Protein-calorie malnutrition, severe 07/21/2017  . Colonic mass 07/18/2017  . Dehydration 07/18/2017  . Hypotension 07/18/2017  . Hypomagnesemia 07/18/2017  . History of prolonged Q-T interval on ECG 07/06/2017  . Tachycardia   . Generalized anxiety disorder   . Acute lower UTI   . Debility 06/23/2017  . Constipation   . Quadriplegia and quadriparesis (HCC)   . Guillain Barr syndrome (HCC)   . Nausea & vomiting 06/21/2017  . Hypokalemia 06/21/2017  . Volume depletion 06/21/2017  . Sinus tachycardia 06/21/2017  . Nausea and vomiting 06/21/2017  . Essential hypertension 04/14/2017  . Paresthesia   . 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 Abdominal pain 01/01/2017  . Sepsis (HCC) 01/01/2017  . Tobacco abuse 01/01/2017  . Abnormal LFTs 01/01/2017  . Depression   . COPD (chronic obstructive pulmonary disease) (HCC) 06/16/2015    Past Surgical History:  Procedure Laterality Date  . COLONOSCOPY WITH PROPOFOL N/A  07/21/2017   Procedure: COLONOSCOPY WITH PROPOFOL;  Surgeon: Rachael Fee, MD;  Location: [facility] ENDOSCOPY;  Service: Endoscopy;  Laterality: N/A;  . LAPAROSCOPY    . laporscopy surgery for ovarian cyst  20 years ago  . TONSILLECTOMY      OB History    Gravida Para Term Preterm AB Living   3 1 1   1 1    SAB TAB Ectopic Multiple Live Births   1               Home Medications    Prior to Admission medications   Medication Sig Start Date End Date Taking? Authorizing Provider  albuterol (PROVENTIL HFA;VENTOLIN HFA) 108 (90 Base) MCG/ACT inhaler Inhale 2 puffs into the lungs every 6 (six) hours as needed for wheezing. 06/18/15  Yes Standley Brooking, MD  Calcium Carb-Cholecalciferol (CALTRATE 600+D) 600-800 MG-UNIT TABS Take 1 tablet by mouth daily. 07/28/17  Yes Loletta Specter, PA-C  feeding supplement, ENSURE COMPLETE, (ENSURE COMPLETE) LIQD Take 237 mLs by mouth 2 (two) times daily between meals. 07/28/17  Yes Loletta Specter, PA-C  ferrous sulfate 325 (65 FE) MG tablet Take 1 tablet (325 mg total) by mouth daily with breakfast. 07/28/17  Yes Loletta Specter, PA-C  gabapentin (NEURONTIN) 400 MG capsule Take 2 capsules (800 mg total) by mouth 3 (three) times daily. 07/06/17  Yes Love, Evlyn Kanner, PA-C  methocarbamol (ROBAXIN) 500 MG tablet Take 1 tablet (500 mg  total) by mouth every 6 (six) hours as needed for muscle spasms. 07/06/17  Yes Love, Evlyn KannerPamela S, PA-C  mirtazapine (REMERON) 15 MG tablet Take 15 mg by mouth at bedtime.   Yes [provider]  ondansetron (ZOFRAN-ODT) 8 MG disintegrating tablet Take 1 tablet (8 mg total) by mouth every 8 (eight) hours as needed for nausea or vomiting. 07/28/17  Yes Loletta SpecterGomez, Roger David, PA-C  polyethylene glycol [facility](MIRALAX / Ethelene HalGLYCOLAX) packet Take 17 g by mouth 2 (two) times daily. Patient taking differently: Take 17 g by mouth daily as needed for mild constipation.  07/06/17  Yes Love, Evlyn KannerPamela S, PA-C  potassium chloride (K-DUR) 10 MEQ tablet Take 1  tablet (10 mEq total) by mouth daily. 07/28/17  Yes Loletta SpecterGomez, Roger David, PA-C  QUEtiapine (SEROQUEL) 400 MG tablet Take 400 mg by mouth at bedtime.   Yes [provider]  acetaminophen (TYLENOL) 500 MG tablet Take 1,000 mg by mouth 2 (two) times daily as needed for headache.    [provider]  metoprolol tartrate (LOPRESSOR) 25 MG tablet Take 0.5 tablets (12.5 mg total) by mouth 2 (two) times daily. Patient not taking: Reported on 08/09/2017 07/21/17   Jerald Kiefhiu, Zoey Bidwell K, MD  ondansetron (ZOFRAN) 4 MG tablet Take 1 tablet (4 mg total) by mouth every 6 (six) hours. 08/09/17   Raeford RazorKohut, Holley Kocurek, MD    Family History Family History  Problem Relation Age of Onset  . Diabetes Father   . Hypertension Father 50       heart attack  . Kidney disease Father   . Heart attack Father   . Heart failure Father   . Dementia Father   . Breast cancer Mother 30       lump removed, bile duct cancer  . COPD Mother   . Hypertension Brother   . Allergic rhinitis Paternal Grandfather     Social History Social History   Tobacco Use  . Smoking status: Current Every Day Smoker    Packs/day: 1.00    Years: 30.00    Pack years: 30.00    Types: Cigarettes  . Smokeless tobacco: Never Used  . Tobacco comment: states "might be interested in the smoking cessation program"  Pt given counseling related to smoking cessation  Substance Use Topics  . Alcohol use: Yes    Comment: couple x a week  . Drug use: No     Allergies   Lipitor [atorvastatin]   Review of Systems Review of Systems  All systems reviewed and negative, other than as noted in HPI.  Physical Exam Updated Vital Signs BP 100/75 (BP Location: Right Arm)   Pulse (!) 109   Temp 98.1 F (36.7 C) (Oral)   Resp 17   Ht 5\' 6"  (1.676 m)   Wt 56.2 kg (124 lb)   LMP 02/22/2006   SpO2 98%   BMI 20.01 kg/m   Physical Exam  Constitutional: She appears well-developed and well-nourished. No distress.  HENT:  Head: Normocephalic  and atraumatic.  Eyes: Conjunctivae are normal. Right eye exhibits no discharge. Left eye exhibits no discharge.  Neck: Neck supple.  Cardiovascular: Normal rate, regular rhythm and normal heart sounds. Exam reveals no gallop and no friction rub.  No murmur heard. Pulmonary/Chest: Effort normal and breath sounds normal. No respiratory distress.  Abdominal: Soft. She exhibits no distension. There is tenderness.  Mild diffuse tenderness.   Musculoskeletal: She exhibits no edema or tenderness.  Neurological: She is alert.  Skin: Skin is warm and  dry.  Psychiatric: She has a normal mood and affect. Her behavior is normal. Thought content normal.  Nursing note and vitals reviewed.    ED Treatments / Results  Labs (all labs ordered are listed, but only abnormal results are displayed) Labs Reviewed  COMPREHENSIVE METABOLIC PANEL - Abnormal; Notable for the following components:      Result Value   Potassium 2.8 (*)    CO2 19 (*)    Glucose, Bld 119 (*)    Calcium 7.8 (*)    Total Protein 5.9 (*)    Albumin 2.6 (*)    AST 46 (*)    All other components within normal limits  CBC WITH DIFFERENTIAL/PLATELET - Abnormal; Notable for the following components:   HCT 35.0 (*)    RDW 16.3 (*)    Neutro Abs 1.2 (*)    All other components within normal limits  CBG MONITORING, ED - Abnormal; Notable for the following components:   Glucose-Capillary 104 (*)    All other components within normal limits  LIPASE, BLOOD  URINALYSIS, ROUTINE W REFLEX MICROSCOPIC    EKG  EKG Interpretation  Date/Time:  Tuesday August 09 2017 16:45:49 EST Ventricular Rate:  126 PR Interval:    QRS Duration: 117 QT Interval:  346 QTC Calculation: 501 R Axis:   15 Text Interpretation:  Sinus tachycardia Multiple premature complexes, vent & supraven Nonspecific intraventricular conduction delay Borderline low voltage, extremity leads Nonspecific T abnormalities, anterior leads Confirmed by Raeford Razor  [phone]) on 08/09/2017 4:59:23 PM       Radiology Ct Abdomen Pelvis W Contrast  Result Date: 08/09/2017 CLINICAL DATA:  Hypotension and tachycardia. Generalized abdominal pain question of abdominal mass clinically. EXAM: CT ABDOMEN AND PELVIS WITH CONTRAST TECHNIQUE: Multidetector CT imaging of the abdomen and pelvis was performed using the standard protocol following bolus administration of intravenous contrast. CONTRAST:  ISOVUE-300 IOPAMIDOL (ISOVUE-300) INJECTION 61% COMPARISON:  07/18/2017 CT FINDINGS: Lower chest: Moderate to large sized hiatal hernia. Normal size heart without pericardial effusion or thickening. Clear lung bases. Hepatobiliary: Hypodense appearance of the liver compatible with steatosis. Normal gallbladder without stones. No biliary dilatation. Pancreas: Atrophic pancreatic gland with ectasia of the pancreatic duct. Scattered coarse calcifications likely related to stigmata of chronic pancreatitis. Spleen: No splenomegaly. Adrenals/Urinary Tract: Normal bilateral adrenal glands and kidneys. No obstructive uropathy or stones. Physiologic distention of the urinary bladder without focal mural thickening or calculus. Stomach/Bowel: Nondistended stomach. Normal small bowel rotation. Mild diffuse fluid-filled distention of small bowel loops without mechanical source of obstruction query enteritis. Distal and terminal ileum are unremarkable. Liquid stool noted within the ascending and proximal transverse colon. Luminal narrowing of the proximal to mid descending colon is new since recent comparison likely related to colonic spasm. Previously noted area of luminal narrowing of the descending colon at the level of the left iliac crest also appears more distended on current exam and may have represented an area of colonic spasm or contraction. A moderate amount of stool is seen in the rectosigmoid on current exam. Vascular/Lymphatic: Aortic atherosclerosis. No enlarged abdominal or pelvic  lymph nodes. Reproductive: Uterus and bilateral adnexa are unremarkable. Other: No abdominal wall hernia or abnormality. No abdominopelvic ascites. Musculoskeletal: Chronic T11 superior endplate compression. No acute osseous abnormality. IMPRESSION: 1. Moderate to large sliding hiatal hernia is redemonstrated. No active pulmonary disease at the lung bases. 2. Hepatic steatosis without space-occupying mass. 3. Stigmata of chronic pancreatitis with pancreatic gland atrophy and ductal ectasia. 4. Mild diffuse fluid-filled  distention of small bowel suspicious for an enteritis. 5. A large amount of fecal retention is seen in the rectosigmoid query constipation. 6. Diffuse luminal narrowing of the descending colon is new since recent comparison and likely reflects colonic spasm. Previously noted areas of luminal narrowing may have represented peristalsis as these appear more distended on current exam. No large bowel inflammation is noted. 7. Chronic T11 superior endplate compression. Electronically Signed   By: Tollie Eth M.D.   On: 08/09/2017 19:17    Procedures Procedures (including critical care time)  Medications Ordered in ED Medications  sodium chloride 0.9 % bolus 1,000 mL (0 mLs Intravenous Stopped 08/09/17 2147)  potassium chloride SA (K-DUR,KLOR-CON) CR tablet 60 mEq (60 mEq Oral Given 08/09/17 1923)  morphine 4 MG/ML injection 4 mg (4 mg Intravenous Given 08/09/17 1845)  ondansetron (ZOFRAN) injection 4 mg (4 mg Intravenous Given 08/09/17 1845)  magnesium sulfate IVPB 2 g 50 mL (0 g Intravenous Stopped 08/09/17 2118)  iopamidol (ISOVUE-300) 61 % injection (100 mLs  Contrast Given 08/09/17 1850)  ketorolac (TORADOL) 15 MG/ML injection 15 mg (15 mg Intravenous Given 08/09/17 2106)     Initial Impression / Assessment and Plan / ED Course  I have reviewed the triage vital signs and the nursing notes.  Pertinent labs & imaging results that were available during my care of the patient were reviewed by  me and considered in my medical decision making (see chart for details).     Likely enteritis. Vitals actually seem consistent with prior. I doubt sepsis or other emergent condition. CT with enteritis. Feeling better after meds. Potassium supplemented. I doubt emergent condition.   Final Clinical Impressions(s) / ED Diagnoses   Final diagnoses:  Enteritis  Hypokalemia    ED Discharge Orders        Ordered    ondansetron (ZOFRAN) 4 MG tablet  Every 6 hours     08/09/17 2113       Raeford Razor, MD 08/11/17 [phone]

## 2017-08-12 ENCOUNTER — Encounter (INDEPENDENT_AMBULATORY_CARE_PROVIDER_SITE_OTHER): Payer: Self-pay | Admitting: Physician Assistant

## 2017-08-12 ENCOUNTER — Telehealth: Payer: Self-pay | Admitting: Gastroenterology

## 2017-08-12 ENCOUNTER — Ambulatory Visit (INDEPENDENT_AMBULATORY_CARE_PROVIDER_SITE_OTHER): Payer: Medicaid Other | Admitting: Physician Assistant

## 2017-08-12 VITALS — BP 88/64 | HR 117 | Temp 97.3°F | Resp 18 | Ht 67.0 in | Wt 129.0 lb

## 2017-08-12 DIAGNOSIS — E876 Hypokalemia: Secondary | ICD-10-CM | POA: Diagnosis not present

## 2017-08-12 DIAGNOSIS — K529 Noninfective gastroenteritis and colitis, unspecified: Secondary | ICD-10-CM

## 2017-08-12 MED ORDER — NAPROXEN 500 MG PO TABS: 500.0000 mg | ORAL_TABLET | Freq: Two times a day (BID) | ORAL | 0 refills | Status: DC

## 2017-08-12 MED ORDER — METRONIDAZOLE 500 MG PO TABS: 500.0000 mg | ORAL_TABLET | Freq: Two times a day (BID) | ORAL | 0 refills | Status: AC

## 2017-08-12 MED ORDER — POTASSIUM CHLORIDE ER 10 MEQ PO TBCR: 20.0000 meq | EXTENDED_RELEASE_TABLET | Freq: Every day | ORAL | 0 refills | Status: DC

## 2017-08-12 MED ORDER — CIPROFLOXACIN HCL 500 MG PO TABS: 500.0000 mg | ORAL_TABLET | Freq: Two times a day (BID) | ORAL | 0 refills | Status: DC

## 2017-08-12 NOTE — Telephone Encounter (Signed)
Next available ROV appt with me is appropriate.  thanks

## 2017-08-12 NOTE — Telephone Encounter (Signed)
See Dr Jacobs note  

## 2017-08-12 NOTE — Patient Instructions (Signed)
Food Choices to Help Relieve Diarrhea, Adult  When you have diarrhea, the foods you eat and your eating habits are very important. Choosing the right foods and drinks can help:  · Relieve diarrhea.  · Replace lost fluids and nutrients.  · Prevent dehydration.    What general guidelines should I follow?  Relieving diarrhea  · Choose foods with less than 2 g or .07 oz. of fiber per serving.  · Limit fats to less than 8 tsp (38 g or 1.34 oz.) a day.  · Avoid the following:  ? Foods and beverages sweetened with high-fructose corn syrup, honey, or sugar alcohols such as xylitol, sorbitol, and mannitol.  ? Foods that contain a lot of fat or sugar.  ? Fried, greasy, or spicy foods.  ? High-fiber grains, breads, and cereals.  ? Raw fruits and vegetables.  · Eat foods that are rich in probiotics. These foods include dairy products such as yogurt and fermented milk products. They help increase healthy bacteria in the stomach and intestines (gastrointestinal tract, or GI tract).  · If you have lactose intolerance, avoid dairy products. These may make your diarrhea worse.  · Take medicine to help stop diarrhea (antidiarrheal medicine) only as told by your health care provider.  Replacing nutrients  · Eat small meals or snacks every 3–4 hours.  · Eat bland foods, such as white rice, toast, or baked potato, until your diarrhea starts to get better. Gradually reintroduce nutrient-rich foods as tolerated or as told by your health care provider. This includes:  ? Well-cooked protein foods.  ? Peeled, seeded, and soft-cooked fruits and vegetables.  ? Low-fat dairy products.  · Take vitamin and mineral supplements as told by your health care provider.  Preventing dehydration    · Start by sipping water or a special solution to prevent dehydration (oral rehydration solution, ORS). Urine that is clear or pale yellow means that you are getting enough fluid.  · Try to drink at least 8–10 cups of fluid each day to help replace lost  fluids.  · You may add other liquids in addition to water, such as clear juice or decaffeinated sports drinks, as tolerated or as told by your health care provider.  · Avoid drinks with caffeine, such as coffee, tea, or soft drinks.  · Avoid alcohol.  What foods are recommended?  The items listed may not be a complete list. Talk with your health care provider about what dietary choices are best for you.  Grains  White rice. White, French, or pita breads (fresh or toasted), including plain rolls, buns, or bagels. White pasta. Saltine, soda, or graham crackers. Pretzels. Low-fiber cereal. Cooked cereals made with water (such as cornmeal, farina, or cream cereals). Plain muffins. Matzo. Melba toast. Zwieback.  Vegetables  Potatoes (without the skin). Most well-cooked and canned vegetables without skins or seeds. Tender lettuce.  Fruits  Apple sauce. Fruits canned in juice. Cooked apricots, cherries, grapefruit, peaches, pears, or plums. Fresh bananas and cantaloupe.  Meats and other protein foods  Baked or boiled chicken. Eggs. Tofu. Fish. Seafood. Smooth nut butters. Ground or well-cooked tender beef, ham, veal, lamb, pork, or poultry.  Dairy  Plain yogurt, kefir, and unsweetened liquid yogurt. Lactose-free milk, buttermilk, skim milk, or soy milk. Low-fat or nonfat hard cheese.  Beverages  Water. Low-calorie sports drinks. Fruit juices without pulp. Strained tomato and vegetable juices. Decaffeinated teas. Sugar-free beverages not sweetened with sugar alcohols. Oral rehydration solutions, if approved by your health care   provider.  Seasoning and other foods  Bouillon, broth, or soups made from recommended foods.  What foods are not recommended?  The items listed may not be a complete list. Talk with your health care provider about what dietary choices are best for you.  Grains  Whole grain, whole wheat, bran, or rye breads, rolls, pastas, and crackers. Wild or brown rice. Whole grain or bran cereals. Barley. Oats  and oatmeal. Corn tortillas or taco shells. Granola. Popcorn.  Vegetables  Raw vegetables. Fried vegetables. Cabbage, broccoli, Brussels sprouts, artichokes, baked beans, beet greens, corn, kale, legumes, peas, sweet potatoes, and yams. Potato skins. Cooked spinach and cabbage.  Fruits  Dried fruit, including raisins and dates. Raw fruits. Stewed or dried prunes. Canned fruits with syrup.  Meat and other protein foods  Fried or fatty meats. Deli meats. Chunky nut butters. Nuts and seeds. Beans and lentils. Bacon. Hot dogs. Sausage.  Dairy  High-fat cheeses. Whole milk, chocolate milk, and beverages made with milk, such as milk shakes. Half-and-half. Cream. sour cream. Ice cream.  Beverages  Caffeinated beverages (such as coffee, tea, soda, or energy drinks). Alcoholic beverages. Fruit juices with pulp. Prune juice. Soft drinks sweetened with high-fructose corn syrup or sugar alcohols. High-calorie sports drinks.  Fats and oils  Butter. Cream sauces. Margarine. Salad oils. Plain salad dressings. Olives. Avocados. Mayonnaise.  Sweets and desserts  Sweet rolls, doughnuts, and sweet breads. Sugar-free desserts sweetened with sugar alcohols such as xylitol and sorbitol.  Seasoning and other foods  Honey. Hot sauce. Chili powder. Gravy. Cream-based or milk-based soups. Pancakes and waffles.  Summary  · When you have diarrhea, the foods you eat and your eating habits are very important.  · Make sure you get at least 8–10 cups of fluid each day, or enough to keep your urine clear or pale yellow.  · Eat bland foods and gradually reintroduce healthy, nutrient-rich foods as tolerated, or as told by your health care provider.  · Avoid high-fiber, fried, greasy, or spicy foods.  This information is not intended to replace advice given to you by your health care provider. Make sure you discuss any questions you have with your health care provider.  Document Released: 08/28/2003 Document Revised: 06/04/2016 Document  Reviewed: 06/04/2016  [employer] Interactive Patient Education © 2018 [employer]

## 2017-08-12 NOTE — Progress Notes (Signed)
Subjective:  Patient ID: Catherine Butler, female    DOB: 09/24/1960  Age: 57 y.o. MRN: [id number]  CC: GI issue  HPI Catherine Butler a 57 y.o.femalewith a PMH of anxiety, asthma,HTNarthritis, COPD, depression, suicide attempt by multiple drug overdose,and acid reflux presentson hospital f/u. Diagnosed with enteritis and hypokalemia. Has mild nausea but no longer feels abdominal pain. No bloating, hematochezia, BRBPR, vomiting, SOB, or dizziness. Reports drinking plenty of water. Does not endorse any other symptoms or complaints.      Outpatient Medications Prior to Visit  Medication Sig Dispense Refill  . acetaminophen (TYLENOL) 500 MG tablet Take 1,000 mg by mouth 2 (two) times daily as needed for headache.    . albuterol (PROVENTIL HFA;VENTOLIN HFA) 108 (90 Base) MCG/ACT inhaler Inhale 2 puffs into the lungs every 6 (six) hours as needed for wheezing. 1 Inhaler 0  . Calcium Carb-Cholecalciferol (CALTRATE 600+D) 600-800 MG-UNIT TABS Take 1 tablet by mouth daily. 30 tablet 0  . feeding supplement, ENSURE COMPLETE, (ENSURE COMPLETE) LIQD Take 237 mLs by mouth 2 (two) times daily between meals. 30 Bottle 1  . ferrous sulfate 325 (65 FE) MG tablet Take 1 tablet (325 mg total) by mouth daily with breakfast. 30 tablet 0  . gabapentin (NEURONTIN) 400 MG capsule Take 2 capsules (800 mg total) by mouth 3 (three) times daily. 180 capsule 0  . methocarbamol (ROBAXIN) 500 MG tablet Take 1 tablet (500 mg total) by mouth every 6 (six) hours as needed for muscle spasms. 120 tablet 0  . metoprolol tartrate (LOPRESSOR) 25 MG tablet Take 0.5 tablets (12.5 mg total) by mouth 2 (two) times daily. (Patient not taking: Reported on 08/09/2017) 60 tablet 0  . mirtazapine (REMERON) 15 MG tablet Take 15 mg by mouth at bedtime.    . ondansetron (ZOFRAN) 4 MG tablet Take 1 tablet (4 mg total) by mouth every 6 (six) hours. 12 tablet 0  . ondansetron (ZOFRAN-ODT) 8 MG disintegrating tablet Take 1 tablet (8 mg total)  by mouth every 8 (eight) hours as needed for nausea or vomiting. 20 tablet 0  . polyethylene glycol (MIRALAX / GLYCOLAX) packet Take 17 g by mouth 2 (two) times daily. (Patient taking differently: Take 17 g by mouth daily as needed for mild constipation. ) 60 each 0  . potassium chloride (K-DUR) 10 MEQ tablet Take 1 tablet (10 mEq total) by mouth daily. 30 tablet 0  . QUEtiapine (SEROQUEL) 400 MG tablet Take 400 mg by mouth at bedtime.     No facility-administered medications prior to visit.      ROS Review of Systems  Constitutional: Negative for chills, fever and malaise/fatigue.  Eyes: Negative for blurred vision.  Respiratory: Negative for shortness of breath.   Cardiovascular: Negative for chest pain and palpitations.  Gastrointestinal: Negative for abdominal pain and nausea.  Genitourinary: Negative for dysuria and hematuria.  Musculoskeletal: Negative for joint pain and myalgias.  Skin: Negative for rash.  Neurological: Negative for tingling and headaches.  Psychiatric/Behavioral: Negative for depression. The patient is not nervous/anxious.     Objective:  BP (!) 88/64 (BP Location: Left Arm, Patient Position: Sitting, Cuff Size: Normal)   Pulse (!) 117   Temp (!) 97.3 F (36.3 C) (Oral)   Resp 18   Ht 5\' 7"  (1.702 m)   Wt 129 lb (58.5 kg)   LMP 02/22/2006   SpO2 100%   BMI 20.20 kg/m   BP/Weight 08/12/2017 08/09/2017 08/09/2017  Systolic BP 88 100 93  Diastolic BP 64 75 67  Wt. (Lbs) 129 124 124  BMI 20.2 20.01 18.85      Physical Exam  Constitutional: She is oriented to person, place, and time.  Thin, sitting in wheelchair, not toxic, NAD, polite,   HENT:  Head: Normocephalic and atraumatic.  Eyes: No scleral icterus.  Neck: Normal range of motion. Neck supple. No thyromegaly present.  Cardiovascular: Normal rate, regular rhythm and normal heart sounds.  Pulmonary/Chest: Effort normal and breath sounds normal.  Abdominal: Soft. Bowel sounds are normal.  There is no tenderness.  Musculoskeletal: She exhibits no edema.  Neurological: She is alert and oriented to person, place, and time. No cranial nerve deficit. Coordination normal.  Skin: Skin is warm and dry. No rash noted. No erythema. No pallor.  Psychiatric: She has a normal mood and affect. Her behavior is normal. Thought content normal.  Vitals reviewed.    Assessment & Plan:    1. Enteritis - Ambulatory referral to Gastroenterology - ciprofloxacin (CIPRO) 500 MG tablet; Take 1 tablet (500 mg total) by mouth 2 (two) times daily.  Dispense: 20 tablet; Refill: 0 - metroNIDAZOLE (FLAGYL) 500 MG tablet; Take 1 tablet (500 mg total) by mouth 2 (two) times daily for 7 days.  Dispense: 14 tablet; Refill: 0 - naproxen (NAPROSYN) 500 MG tablet; Take 1 tablet (500 mg total) by mouth 2 (two) times daily with a meal.  Dispense: 30 tablet; Refill: 0 - Clostridium difficile culture-fecal; Future - Fecal leukocytes; Future - Ova and Parasite Examination; Future - Stool culture; Future  2. Hypokalemia - potassium chloride (K-DUR) 10 MEQ tablet; Take 2 tablets (20 mEq total) by mouth daily.  Dispense: 30 tablet; Refill: 0   Meds ordered this encounter  Medications  . ciprofloxacin (CIPRO) 500 MG tablet    Sig: Take 1 tablet (500 mg total) by mouth 2 (two) times daily.    Dispense:  20 tablet    Refill:  0    Order Specific Question:   Supervising Provider    Answer:   Quentin Angst [id number]  . metroNIDAZOLE (FLAGYL) 500 MG tablet    Sig: Take 1 tablet (500 mg total) by mouth 2 (two) times daily for 7 days.    Dispense:  14 tablet    Refill:  0    Order Specific Question:   Supervising Provider    Answer:   Quentin Angst [id number]  . naproxen (NAPROSYN) 500 MG tablet    Sig: Take 1 tablet (500 mg total) by mouth 2 (two) times daily with a meal.    Dispense:  30 tablet    Refill:  0    Order Specific Question:   Supervising Provider    Answer:   Quentin Angst  [id number]  . potassium chloride (K-DUR) 10 MEQ tablet    Sig: Take 2 tablets (20 mEq total) by mouth daily.    Dispense:  30 tablet    Refill:  0    Order Specific Question:   Supervising Provider    Answer:   Quentin Angst [id number]    Follow-up: Return in about 2 weeks (around 08/26/2017) for enteritis.   Loletta Specter PA

## 2017-08-12 NOTE — Telephone Encounter (Signed)
Dr Christella HartiganJacobs it looks like this pt was seen by you in January at the hospital and a colon was done.  Does she need a follow up appt if so, how soon?

## 2017-08-15 NOTE — Telephone Encounter (Signed)
Appointment scheduled for 10/24/17

## 2017-08-22 ENCOUNTER — Ambulatory Visit (INDEPENDENT_AMBULATORY_CARE_PROVIDER_SITE_OTHER): Payer: Self-pay | Admitting: Physician Assistant

## 2017-08-22 ENCOUNTER — Ambulatory Visit (INDEPENDENT_AMBULATORY_CARE_PROVIDER_SITE_OTHER): Payer: Medicaid Other | Admitting: Physician Assistant

## 2017-08-22 ENCOUNTER — Encounter (INDEPENDENT_AMBULATORY_CARE_PROVIDER_SITE_OTHER): Payer: Self-pay | Admitting: Physician Assistant

## 2017-08-22 ENCOUNTER — Other Ambulatory Visit: Payer: Self-pay

## 2017-08-22 VITALS — BP 101/70 | HR 113 | Temp 97.2°F | Wt 130.2 lb

## 2017-08-22 DIAGNOSIS — R258 Other abnormal involuntary movements: Secondary | ICD-10-CM

## 2017-08-22 DIAGNOSIS — K5900 Constipation, unspecified: Secondary | ICD-10-CM

## 2017-08-22 MED ORDER — LINACLOTIDE 290 MCG PO CAPS: 290.0000 ug | ORAL_CAPSULE | Freq: Every day | ORAL | 5 refills | Status: DC

## 2017-08-22 MED FILL — !LINZESS 290 MCG CAPSULE: 290 | 30 days supply | Qty: 30 | Fill #0 | Status: TO

## 2017-08-22 NOTE — Progress Notes (Signed)
Subjective:  Patient ID: Catherine Butler, female    DOB: 02-Apr-1961  Age: 57 y.o. MRN: [id number]  CC: f/u enteritis. Hand pain.  HPI Catherine Butler a 57 y.o.femalewith a PMH of anxiety, asthma,HTNarthritis, COPD, depression, suicide attempt by multiple drug overdose,Guillan Barre, and acid reflux presents on f/u of enteritis. Enteritis originally suspected to have enteritis on CT abdomen and pelvis after work up for abdominal pain complaint. Pt prescribed ciprofloxacin and metronidazole approximately two weeks ago. Pt says she no longer feels abdominal pain or the constant feeling of a "rope" around her waist. No fever or chills. She endorses constipation for two weeks. Associated nausea without vomiting. Has not had a bowel movement in two weeks but says there is no bloating or abdominal pain.    Also complains of uncontrolled movement of the left hand greater than the right hand. 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 Choreiform movements had mostly subsided until last week when left arm began to move uncontrollably. Right hand has similar uncontrolled movement but to a much less degree. Review of medication shows she is taking Seroquel but says she has taken Seroquel for five years without incident. Does not endorse uncontrolled tongue movement or have slurred speech. Pt does not know if financial assistance applications and Medicaid applications have been done or are under review. Son states patient has more than $100,000 in medical bills.      Outpatient Medications Prior to Visit  Medication Sig Dispense Refill  . acetaminophen (TYLENOL) 500 MG tablet Take 1,000 mg by mouth 2 (two) times daily as needed for headache.    . albuterol (PROVENTIL HFA;VENTOLIN HFA) 108 (90 Base) MCG/ACT inhaler Inhale 2 puffs into the lungs every 6 (six) hours as needed for wheezing. 1 Inhaler 0  . Calcium Carb-Cholecalciferol (CALTRATE 600+D) 600-800 MG-UNIT TABS Take 1 tablet by mouth daily.  30 tablet 0  . ferrous sulfate 325 (65 FE) MG tablet Take 1 tablet (325 mg total) by mouth daily with breakfast. 30 tablet 0  . gabapentin (NEURONTIN) 400 MG capsule Take 2 capsules (800 mg total) by mouth 3 (three) times daily. 180 capsule 0  . methocarbamol (ROBAXIN) 500 MG tablet Take 1 tablet (500 mg total) by mouth every 6 (six) hours as needed for muscle spasms. 120 tablet 0  . mirtazapine (REMERON) 15 MG tablet Take 15 mg by mouth at bedtime.    . naproxen (NAPROSYN) 500 MG tablet Take 1 tablet (500 mg total) by mouth 2 (two) times daily with a meal. 30 tablet 0  . ondansetron (ZOFRAN) 4 MG tablet Take 1 tablet (4 mg total) by mouth every 6 (six) hours. 12 tablet 0  . ondansetron (ZOFRAN-ODT) 8 MG disintegrating tablet Take 1 tablet (8 mg total) by mouth every 8 (eight) hours as needed for nausea or vomiting. 20 tablet 0  . polyethylene glycol (MIRALAX / GLYCOLAX) packet Take 17 g by mouth 2 (two) times daily. (Patient taking differently: Take 17 g by mouth daily as needed for mild constipation. ) 60 each 0  . potassium chloride (K-DUR) 10 MEQ tablet Take 2 tablets (20 mEq total) by mouth daily. 30 tablet 0  . QUEtiapine (SEROQUEL) 400 MG tablet Take 400 mg by mouth at bedtime.    . feeding supplement, ENSURE COMPLETE, (ENSURE COMPLETE) LIQD Take 237 mLs by mouth 2 (two) times daily between meals. 30 Bottle 1  . metoprolol tartrate (LOPRESSOR) 25 MG tablet Take 0.5 tablets (12.5 mg total) by  mouth 2 (two) times daily. (Patient not taking: Reported on 08/09/2017) 60 tablet 0  . ciprofloxacin (CIPRO) 500 MG tablet Take 1 tablet (500 mg total) by mouth 2 (two) times daily. 20 tablet 0   No facility-administered medications prior to visit.      ROS Review of Systems  Constitutional: Negative for chills, fever and malaise/fatigue.  Eyes: Negative for blurred vision.  Respiratory: Negative for shortness of breath.   Cardiovascular: Negative for chest pain and palpitations.  Gastrointestinal:  Negative for abdominal pain (mild abdominal discomfort) and nausea.  Genitourinary: Negative for dysuria and hematuria.  Musculoskeletal: Negative for joint pain and myalgias.  Skin: Negative for rash.  Neurological: Negative for tingling and headaches.       Uncontrolled movements of the hands L > R.   Psychiatric/Behavioral: Negative for depression. The patient is not nervous/anxious.     Objective:  Wt 130 lb 3.2 oz (59.1 kg)   LMP 02/22/2006   BMI 20.39 kg/m     Vitals:   08/22/17 1128  BP: 101/70  Pulse: (!) 113  Temp: (!) 97.2 F (36.2 C)  SpO2: 98%       Physical Exam  Constitutional: She is oriented to person, place, and time.  Thin, pale, NAD, polite, mild to moderate uncontrolled left hand movement, sitting in wheelchair.  HENT:  Head: Normocephalic and atraumatic.  Eyes: Conjunctivae are normal. No scleral icterus.  Neck: Normal range of motion. Neck supple. No thyromegaly present.  Cardiovascular: Normal rate, regular rhythm and normal heart sounds.  No carotid bruit bilaterally. No LE edema bilaterally.  Pulmonary/Chest: Effort normal and breath sounds normal.  Abdominal: Soft. Bowel sounds are normal. There is no tenderness.  Musculoskeletal: She exhibits no edema.  Neurological: She is alert and oriented to person, place, and time.  Pronounced and uncontrolled left hand movement. Mild uncontrolled right hand movement. Mild head bob.   Skin: Skin is warm and dry.  pale  Psychiatric: She has a normal mood and affect. Her behavior is normal. Thought content normal.  Vitals reviewed.    Assessment & Plan:   1. Choreiform movement - Comprehensive metabolic panel - CBC with Differential - Magnesium - Vitamin B12 - PTH, Intact and Calcium - ANA w/Reflex - linaclotide (LINZESS) 290 MCG CAPS capsule; Take 1 capsule (290 mcg total) by mouth daily before breakfast.  Dispense: 30 capsule; Refill: 5. Called [facility] pharmacy and they will be dispensing Linzess  samples to patient.  - Ambulatory referral to Neurology  2. Constipation, unspecified constipation type - Comprehensive metabolic panel - CBC with Differential - Magnesium - Vitamin B12 - PTH, Intact and Calcium - ANA w/Reflex - linaclotide (LINZESS) 290 MCG CAPS capsule; Take 1 capsule (290 mcg total) by mouth daily before breakfast.  Dispense: 30 capsule; Refill: 5   Meds ordered this encounter  Medications  . linaclotide (LINZESS) 290 MCG CAPS capsule    Sig: Take 1 capsule (290 mcg total) by mouth daily before breakfast.    Dispense:  30 capsule    Refill:  5    Order Specific Question:   Supervising Provider    Answer:   Quentin AngstJEGEDE, OLUGBEMIGA E [[id number]]    Follow-up: Return in about 1 week (around 08/29/2017) for consitpation. and f/u of labs.  Loletta Specteroger David Gomez PA

## 2017-08-22 NOTE — Patient Instructions (Signed)

## 2017-08-23 ENCOUNTER — Encounter: Payer: Self-pay | Admitting: Neurology

## 2017-08-23 ENCOUNTER — Other Ambulatory Visit (INDEPENDENT_AMBULATORY_CARE_PROVIDER_SITE_OTHER): Payer: Self-pay | Admitting: Physician Assistant

## 2017-08-23 DIAGNOSIS — K529 Noninfective gastroenteritis and colitis, unspecified: Secondary | ICD-10-CM

## 2017-08-23 LAB — COMPREHENSIVE METABOLIC PANEL
ALT: 24 IU/L (ref 0–32)
AST: 40 IU/L (ref 0–40)
Albumin/Globulin Ratio: 1.4 (ref 1.2–2.2)
Albumin: 3 g/dL — ABNORMAL LOW (ref 3.5–5.5)
Alkaline Phosphatase: 108 IU/L (ref 39–117)
BUN/Creatinine Ratio: 10 (ref 9–23)
BUN: 6 mg/dL (ref 6–24)
Bilirubin Total: 0.3 mg/dL (ref 0.0–1.2)
CO2: 18 mmol/L — AB (ref 20–29)
CREATININE: 0.61 mg/dL (ref 0.57–1.00)
Calcium: 8.2 mg/dL — ABNORMAL LOW (ref 8.7–10.2)
Chloride: 96 mmol/L (ref 96–106)
GFR calc Af Amer: 119 mL/min/{1.73_m2} (ref >59)
GFR calc non Af Amer: 103 mL/min/{1.73_m2} (ref >59)
GLOBULIN, TOTAL: 2.2 g/dL (ref 1.5–4.5)
Glucose: 122 mg/dL — ABNORMAL HIGH (ref 65–99)
Potassium: 4.7 mmol/L (ref 3.5–5.2)
SODIUM: 132 mmol/L — AB (ref 134–144)
Total Protein: 5.2 g/dL — ABNORMAL LOW (ref 6.0–8.5)

## 2017-08-23 LAB — CBC WITH DIFFERENTIAL/PLATELET
Basophils Absolute: 0 10*3/uL (ref 0.0–0.2)
Basos: 1 %
EOS (ABSOLUTE): 0 10*3/uL (ref 0.0–0.4)
Eos: 1 %
Hematocrit: 34.9 % (ref 34.0–46.6)
Hemoglobin: 11.6 g/dL (ref 11.1–15.9)
IMMATURE GRANULOCYTES: 0 %
Immature Grans (Abs): 0 10*3/uL (ref 0.0–0.1)
LYMPHS: 38 %
Lymphocytes Absolute: 1.6 10*3/uL (ref 0.7–3.1)
MCH: 31 pg (ref 26.6–33.0)
MCHC: 33.2 g/dL (ref 31.5–35.7)
MCV: 93 fL (ref 79–97)
MONOS ABS: 0.6 10*3/uL (ref 0.1–0.9)
Monocytes: 15 %
NEUTROS PCT: 45 %
Neutrophils Absolute: 2 10*3/uL (ref 1.4–7.0)
PLATELETS: 370 10*3/uL (ref 150–379)
RBC: 3.74 x10E6/uL — ABNORMAL LOW (ref 3.77–5.28)
RDW: 16 % — AB (ref 12.3–15.4)
WBC: 4.3 10*3/uL (ref 3.4–10.8)

## 2017-08-23 LAB — MAGNESIUM: MAGNESIUM: 1.1 mg/dL — AB (ref 1.6–2.3)

## 2017-08-23 LAB — PTH, INTACT AND CALCIUM

## 2017-08-23 LAB — ANA W/REFLEX: Anti Nuclear Antibody(ANA): NEGATIVE

## 2017-08-23 LAB — VITAMIN B12: Vitamin B-12: 342 pg/mL (ref 232–1245)

## 2017-08-23 NOTE — Telephone Encounter (Signed)
[facility]. Patient was informed yesterday of samples being ready for pick up. Maryjean Mornempestt S Roberts, CMA

## 2017-08-23 NOTE — Telephone Encounter (Signed)
Pt has been informed of [facility]

## 2017-08-23 NOTE — Telephone Encounter (Signed)
Pt called to request some samples of linaclotide (LINZESS) 290 MCG CAPS capsule [facility], she need th sample since is free and she does not have any money to paid for it, please if you can't sent call her back

## 2017-08-24 ENCOUNTER — Other Ambulatory Visit (INDEPENDENT_AMBULATORY_CARE_PROVIDER_SITE_OTHER): Payer: Self-pay | Admitting: Physician Assistant

## 2017-08-24 DIAGNOSIS — E778 Other disorders of glycoprotein metabolism: Secondary | ICD-10-CM

## 2017-08-24 MED ORDER — BOOST PLUS PO LIQD: 237.0000 mL | Freq: Three times a day (TID) | ORAL | 11 refills | Status: DC

## 2017-08-24 MED ORDER — MAGNESIUM LACTATE 84 MG (7MEQ) PO TBCR: 84.0000 mg | EXTENDED_RELEASE_TABLET | Freq: Two times a day (BID) | ORAL | 0 refills | Status: DC

## 2017-08-25 ENCOUNTER — Telehealth (INDEPENDENT_AMBULATORY_CARE_PROVIDER_SITE_OTHER): Payer: Self-pay

## 2017-08-25 NOTE — Telephone Encounter (Signed)
Patient is aware of results. She is also aware to pick up magn and boost from pharmacy. Patient is requesting refill of naproxen. Maryjean Mornempestt S Roberts, CMA

## 2017-08-25 NOTE — Telephone Encounter (Signed)
-----   Message from Loletta Specteroger David Gomez, PA-C sent at 08/24/2017  5:53 PM EST ----- Magnesium, Sodium, Calcium, and protein a little low. Please supplement diet with more liberal use of salt. Drink boost plus protein drinks, and take magnesium pills. [facility] mag [facility]. I will see her on f/u next week.

## 2017-08-26 ENCOUNTER — Other Ambulatory Visit (INDEPENDENT_AMBULATORY_CARE_PROVIDER_SITE_OTHER): Payer: Self-pay | Admitting: Physician Assistant

## 2017-08-26 DIAGNOSIS — K529 Noninfective gastroenteritis and colitis, unspecified: Secondary | ICD-10-CM

## 2017-08-26 DIAGNOSIS — Z760 Encounter for issue of repeat prescription: Secondary | ICD-10-CM

## 2017-08-26 MED ORDER — NAPROXEN 500 MG PO TABS: 500.0000 mg | ORAL_TABLET | Freq: Two times a day (BID) | ORAL | 0 refills | Status: DC

## 2017-08-26 NOTE — Telephone Encounter (Signed)
Patient aware. Catherine Butler [name], CMA  

## 2017-08-26 NOTE — Telephone Encounter (Signed)
I have [facility] ref[facility] to [facility].

## 2017-08-29 ENCOUNTER — Other Ambulatory Visit: Payer: Self-pay

## 2017-08-29 ENCOUNTER — Ambulatory Visit (INDEPENDENT_AMBULATORY_CARE_PROVIDER_SITE_OTHER): Payer: Medicaid Other | Admitting: Physician Assistant

## 2017-08-29 ENCOUNTER — Encounter (INDEPENDENT_AMBULATORY_CARE_PROVIDER_SITE_OTHER): Payer: Self-pay | Admitting: Physician Assistant

## 2017-08-29 VITALS — BP 106/70 | HR 90 | Temp 97.3°F | Wt 128.0 lb

## 2017-08-29 DIAGNOSIS — E878 Other disorders of electrolyte and fluid balance, not elsewhere classified: Secondary | ICD-10-CM | POA: Diagnosis not present

## 2017-08-29 DIAGNOSIS — R202 Paresthesia of skin: Secondary | ICD-10-CM

## 2017-08-29 DIAGNOSIS — R258 Other abnormal involuntary movements: Secondary | ICD-10-CM | POA: Diagnosis not present

## 2017-08-29 DIAGNOSIS — Z1239 Encounter for other screening for malignant neoplasm of breast: Secondary | ICD-10-CM

## 2017-08-29 DIAGNOSIS — R112 Nausea with vomiting, unspecified: Secondary | ICD-10-CM

## 2017-08-29 DIAGNOSIS — Z1231 Encounter for screening mammogram for malignant neoplasm of breast: Secondary | ICD-10-CM

## 2017-08-29 DIAGNOSIS — K5900 Constipation, unspecified: Secondary | ICD-10-CM | POA: Diagnosis not present

## 2017-08-29 MED ORDER — GABAPENTIN 600 MG PO TABS: 1200.0000 mg | ORAL_TABLET | Freq: Three times a day (TID) | ORAL | 1 refills | Status: DC

## 2017-08-29 MED ORDER — ONDANSETRON 8 MG PO TBDP: 8.0000 mg | ORAL_TABLET | Freq: Three times a day (TID) | ORAL | 0 refills | Status: DC | PRN

## 2017-08-29 NOTE — Patient Instructions (Signed)
Nausea and Vomiting, Adult Feeling sick to your stomach (nausea) means that your stomach is upset or you feel like you have to throw up (vomit). Feeling more and more sick to your stomach can lead to throwing up. Throwing up happens when food and liquid from your stomach are thrown up and out the mouth. Throwing up can make you feel weak and cause you to get dehydrated. Dehydration can make you tired and thirsty, make you have a dry mouth, and make it so you pee (urinate) less often. Older adults and people with other diseases or a weak defense system (immune system) are at higher risk for dehydration. If you feel sick to your stomach or if you throw up, it is important to follow instructions from your doctor about how to take care of yourself. Follow these instructions at home: Eating and drinking Follow these instructions as told by your doctor:  Take an oral rehydration solution (ORS). This is a drink that is sold at pharmacies and stores.  Drink clear fluids in small amounts as you are able, such as: ? Water. ? Ice chips. ? Diluted fruit juice. ? Low-calorie sports drinks.  Eat bland, easy-to-digest foods in small amounts as you are able, such as: ? Bananas. ? Applesauce. ? Rice. ? Low-fat (lean) meats. ? Toast. ? Crackers.  Avoid fluids that have a lot of sugar or caffeine in them.  Avoid alcohol.  Avoid spicy or fatty foods.  General instructions  Drink enough fluid to keep your pee (urine) clear or pale yellow.  Wash your hands often. If you cannot use soap and water, use hand sanitizer.  Make sure that all people in your home wash their hands well and often.  Take over-the-counter and prescription medicines only as told by your doctor.  Rest at home while you get better.  Watch your condition for any changes.  Breathe slowly and deeply when you feel sick to your stomach.  Keep all follow-up visits as told by your doctor. This is important. Contact a doctor  if:  You have a fever.  You cannot keep fluids down.  Your symptoms get worse.  You have new symptoms.  You feel sick to your stomach for more than two days.  You feel light-headed or dizzy.  You have a headache.  You have muscle cramps. Get help right away if:  You have pain in your chest, neck, arm, or jaw.  You feel very weak or you pass out (faint).  You throw up again and again.  You see blood in your throw-up.  Your throw-up looks like black coffee grounds.  You have bloody or black poop (stools) or poop that look like tar.  You have a very bad headache, a stiff neck, or both.  You have a rash.  You have very bad pain, cramping, or bloating in your belly (abdomen).  You have trouble breathing.  You are breathing very quickly.  Your heart is beating very quickly.  Your skin feels cold and clammy.  You feel confused.  You have pain when you pee.  You have signs of dehydration, such as: ? Dark pee, hardly any pee, or no pee. ? Cracked lips. ? Dry mouth. ? Sunken eyes. ? Sleepiness. ? Weakness. These symptoms may be an emergency. Do not wait to see if the symptoms will go away. Get medical help right away. Call your local emergency services (911 in the [place] Do not drive yourself to the hospital. This information is   not intended to replace advice given to you by your health care provider. Make sure you discuss any questions you have with your health care provider. Document Released: 11/24/2007 Document Revised: 12/26/2015 Document Reviewed: 02/11/2015 Elsevier Interactive Patient Education  2018 [employer]  

## 2017-08-29 NOTE — Progress Notes (Signed)
Subjective:  Patient ID: Catherine Butler, female    DOB: [birth year]  Age: 57 y.o. MRN: [id number]  CC: Constipation  HPI Catherine Butler a 57 y.o.femalewith a PMH of anxiety, asthma,HTNarthritis, COPD, depression, suicide attempt by multiple drug overdose,[place], and acid reflux presents on f/u of choreiform movement and constipation.. Says the movements mildly decreased since last visit. 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 Pain worse at the fingertips.Takes Gabapentin but not effective enough. Would like something to control pain in the hands. Neurology appointment in 8 days.     Constipation has been somewhat relieved with Linzess. Did not fill Linzess until four days ago. Had a bowel movement yesterday. Says she continues with nausea and vomiting but had only two days in which she vomited "just a few times". Has an occasional feeling of a "rope like sensation" around her abdomen but reports the pain has decreased significantly since treatment with antibiotics. Does not endorse fever, chills, BPBPR or melena.      Outpatient Medications Prior to Visit  Medication Sig Dispense Refill  . acetaminophen (TYLENOL) 500 MG tablet Take 1,000 mg by mouth 2 (two) times daily as needed for headache.    . albuterol (PROVENTIL HFA;VENTOLIN HFA) 108 (90 Base) MCG/ACT inhaler Inhale 2 puffs into the lungs every 6 (six) hours as needed for wheezing. 1 Inhaler 0  . Calcium Carb-Cholecalciferol (CALTRATE 600+D) 600-800 MG-UNIT TABS Take 1 tablet by mouth daily. 30 tablet 0  . ferrous sulfate 325 (65 FE) MG tablet Take 1 tablet (325 mg total) by mouth daily with breakfast. 30 tablet 0  . gabapentin (NEURONTIN) 400 MG capsule Take 2 capsules (800 mg total) by mouth 3 (three) times daily. 180 capsule 0  . lactose free nutrition (BOOST PLUS) LIQD Take 237 mLs by mouth 3 (three) times daily with meals. 30 Can 11  . linaclotide (LINZESS) 290 MCG CAPS capsule Take 1  capsule (290 mcg total) by mouth daily before breakfast. 30 capsule 5  . magnesium (MAGTAB) 84 MG ( ) TBCR SR tablet Take 1 tablet (84 mg total) by mouth 2 (two) times daily. 14 tablet 0  . methocarbamol (ROBAXIN) 500 MG tablet Take 1 tablet (500 mg total) by mouth every 6 (six) hours as needed for muscle spasms. 120 tablet 0  . mirtazapine (REMERON) 15 MG tablet Take 15 mg by mouth at bedtime.    . naproxen (NAPROSYN) 500 MG tablet Take 1 tablet (500 mg total) by mouth 2 (two) times daily with a meal. 30 tablet 0  . ondansetron (ZOFRAN) 4 MG tablet Take 1 tablet (4 mg total) by mouth every 6 (six) hours. 12 tablet 0  . ondansetron (ZOFRAN-ODT) 8 MG disintegrating tablet Take 1 tablet (8 mg total) by mouth every 8 (eight) hours as needed for nausea or vomiting. 20 tablet 0  . polyethylene glycol (MIRALAX / GLYCOLAX) packet Take 17 g by mouth 2 (two) times daily. (Patient taking differently: Take 17 g by mouth daily as needed for mild constipation. ) 60 each 0  . potassium chloride (K-DUR) 10 MEQ tablet Take 2 tablets (20 mEq total) by mouth daily. 30 tablet 0  . QUEtiapine (SEROQUEL) 400 MG tablet Take 400 mg by mouth at bedtime.    . metoprolol tartrate (LOPRESSOR) 25 MG tablet Take 0.5 tablets (12.5 mg total) by mouth 2 (two) times daily. (Patient not taking: Reported on 08/09/2017) 60 tablet 0   No facility-administered medications prior to visit.  ROS Review of Systems  Constitutional: Positive for malaise/fatigue. Negative for chills and fever.  Eyes: Negative for blurred vision.  Respiratory: Negative for shortness of breath.   Cardiovascular: Negative for chest pain and palpitations.  Gastrointestinal: Positive for constipation, nausea and vomiting. Negative for abdominal pain.  Genitourinary: Negative for dysuria and hematuria.  Musculoskeletal: Negative for joint pain and myalgias.  Skin: Negative for rash.  Neurological: Positive for weakness. Negative for tingling and  headaches.       Paresthesias and weakness of upper and lower extremities.  Psychiatric/Behavioral: Negative for depression. The patient is not nervous/anxious.     Objective:  BP 106/70 (BP Location: Left Arm, Patient Position: Sitting, Cuff Size: Normal)   Pulse 90   Temp (!) 97.3 F (36.3 C) (Oral)   Wt 128 lb (58.1 kg)   LMP 02/22/2006   SpO2 97%   BMI 20.05 kg/m   BP/Weight 08/29/2017 08/22/2017 08/12/2017  Systolic BP 106 101 88  Diastolic BP 70 70 64  Wt. (Lbs) 128 130.2 129  BMI 20.05 20.39 20.2      Physical Exam  Constitutional: She is oriented to person, place, and time.  Pale, frail, NAD, using walker  HENT:  Head: Normocephalic and atraumatic.  Eyes: No scleral icterus.  Neck: Normal range of motion. Neck supple. No thyromegaly present.  Cardiovascular: Normal rate, regular rhythm and normal heart sounds.  Pulmonary/Chest: Effort normal and breath sounds normal. No respiratory distress.  Musculoskeletal: She exhibits no edema.  Neurological: She is alert and oriented to person, place, and time. No cranial nerve deficit. Coordination normal.  No choreiform movement noted today  Skin: Skin is warm and dry. No rash noted. No erythema. No pallor.  Psychiatric: She has a normal mood and affect. Her behavior is normal. Thought content normal.  Vitals reviewed.    Assessment & Plan:   1. Choreiform movement - Neurology appointment in 8 days. - Possibly 2/2 metabolic derangement from previous enteritis. Will continue to monitor electrolytes   2. Paresthesia - Increase gabapentin (NEURONTIN) 600 MG tablet; Take 2 tablets (1,200 mg total) by mouth 3 (three) times daily.  Dispense: 180 tablet; Refill: 1 - Basic Metabolic Panel - CBC with Differential  3. Constipation, unspecified constipation type - Basic Metabolic Panel - CBC with Differential - Continue Linzess.   4. Electrolyte abnormality - Basic Metabolic Panel - CBC with Differential  5.  Non-intractable vomiting with nausea, unspecified vomiting type - Basic Metabolic Panel - CBC with Differential  6. Nausea and vomiting, intractability of vomiting not specified, unspecified vomiting type - Refill ondansetron (ZOFRAN-ODT) 8 MG disintegrating tablet; Take 1 tablet (8 mg total) by mouth every 8 (eight) hours as needed for nausea or vomiting.  Dispense: 90 tablet; Refill: 0 - Basic Metabolic Panel - CBC with Differential  7. Screening for breast cancer - Patient has not been able to have mammography scheduled yet. This can help r/o one cause of paraneoplastic chorea.   Meds ordered this encounter  Medications  . ondansetron (ZOFRAN-ODT) 8 MG disintegrating tablet    Sig: Take 1 tablet (8 mg total) by mouth every 8 (eight) hours as needed for nausea or vomiting.    Dispense:  90 tablet    Refill:  0    Order Specific Question:   Supervising Provider    Answer:   Quentin Angst [id number]  . gabapentin (NEURONTIN) 600 MG tablet    Sig: Take 2 tablets (1,200 mg total) by mouth 3 (three) times  daily.    Dispense:  180 tablet    Refill:  1    Order Specific Question:   Supervising Provider    Answer:   Quentin AngstJEGEDE, OLUGBEMIGA E [id number]    Follow-up: Return in about 4 weeks (around 09/26/2017) for Multiple symptoms.   Loletta Specteroger David Gomez PA

## 2017-08-31 ENCOUNTER — Telehealth (INDEPENDENT_AMBULATORY_CARE_PROVIDER_SITE_OTHER): Payer: Self-pay

## 2017-08-31 ENCOUNTER — Other Ambulatory Visit (INDEPENDENT_AMBULATORY_CARE_PROVIDER_SITE_OTHER): Payer: Self-pay | Admitting: Physician Assistant

## 2017-08-31 DIAGNOSIS — R112 Nausea with vomiting, unspecified: Secondary | ICD-10-CM

## 2017-08-31 LAB — CBC WITH DIFFERENTIAL/PLATELET

## 2017-08-31 LAB — BASIC METABOLIC PANEL
BUN / CREAT RATIO: 11 (ref 9–23)
BUN: 7 mg/dL (ref 6–24)
CHLORIDE: 99 mmol/L (ref 96–106)
CO2: 15 mmol/L — AB (ref 20–29)
CREATININE: 0.65 mg/dL (ref 0.57–1.00)
Calcium: 8.8 mg/dL (ref 8.7–10.2)
GFR calc Af Amer: 116 mL/min/{1.73_m2} (ref >59)
GFR calc non Af Amer: 100 mL/min/{1.73_m2} (ref >59)
GLUCOSE: 103 mg/dL — AB (ref 65–99)
Potassium: 4.2 mmol/L (ref 3.5–5.2)
Sodium: 132 mmol/L — ABNORMAL LOW (ref 134–144)

## 2017-08-31 NOTE — Telephone Encounter (Signed)
-----   Message from Loletta Specteroger David Gomez, PA-C sent at 08/31/2017  8:43 AM EDT ----- BMP and CBC must be redrawn likely due to specimen collection errors.  I will make a new order for BMP and CBC. [facility].

## 2017-08-31 NOTE — Telephone Encounter (Signed)
Patient aware that she will need a redraw as lab could not run specimen. Patient is aware to come by the clinic to pick up lab orders to take with her to [facility]. Catherine Butler, CMA

## 2017-09-02 ENCOUNTER — Encounter: Payer: Self-pay | Admitting: Gastroenterology

## 2017-09-02 ENCOUNTER — Ambulatory Visit: Payer: Medicaid Other | Admitting: Gastroenterology

## 2017-09-02 DIAGNOSIS — R258 Other abnormal involuntary movements: Secondary | ICD-10-CM

## 2017-09-02 DIAGNOSIS — K5900 Constipation, unspecified: Secondary | ICD-10-CM | POA: Diagnosis not present

## 2017-09-02 MED ORDER — LINACLOTIDE 145 MCG PO CAPS: 145.0000 ug | ORAL_CAPSULE | Freq: Every day | ORAL | 11 refills | Status: DC

## 2017-09-02 NOTE — Patient Instructions (Addendum)
Cut back on linzess to 145mg  dose (new script written, one pill one daily) Please start taking citrucel (orange flavored) powder fiber supplement.  This may cause some bloating at first but that usually goes away. Begin with a small spoonful and work your way up to a large, heaping spoonful daily over a week.  Please return to see Dr. Christella HartiganJacobs in 2-3 months.  Normal BMI (Body Mass Index- based on height and weight) is between 19 and 25. Your BMI today is Body mass index is 20.26 kg/m. Marland Kitchen. Please consider follow up  regarding your BMI with your Primary Care Provider.

## 2017-09-02 NOTE — Progress Notes (Signed)
HPI: This is a very pleasant 57 year old woman whom I last saw when she was in the hospital  We received a referral for her after that visit but I believe that the referral was to address the issues that I addressed while she was in the hospital.  This will instead serve as a follow-up for her hospital visit.  I last saw her about a month and a half ago when she was hospitalized.  There was a question about a partial colonic obstruction and a CT scan done at that time raised the possibility of "colonic mass versus fecal retention"    Colonoscopy July 21, 2017 was completely normal.  I recommended that she try daily fiber supplements for her chronic constipation and that she consider colonoscopy again in 10 years for routine colon cancer screening.  Chief complaint is difficulty moving her bowels  Started linzess about a week ago and this is causing her to have a bit of urgency and liquid stools.   ROS: complete GI ROS as described in HPI, all other review negative.  Constitutional:  No unintentional weight loss   Past Medical History:  Diagnosis Date  . Anxiety   . Arthritis   . Asthma   . Complication of anesthesia    nausea  . COPD (chronic obstructive pulmonary disease) (HCC)   . Depression   . H/O alcohol abuse   . Hypertension   . PONV (postoperative nausea and vomiting)   . Reflux   . Suicide attempt by multiple drug overdose (HCC) 2014    Past Surgical History:  Procedure Laterality Date  . COLONOSCOPY WITH PROPOFOL N/A 07/21/2017   Procedure: COLONOSCOPY WITH PROPOFOL;  Surgeon: Rachael FeeJacobs, Kandis Henry P, MD;  Location: [facility];  Service: Endoscopy;  Laterality: N/A;  . LAPAROSCOPY    . laporscopy surgery for ovarian cyst  20 years ago  . TONSILLECTOMY      Current Outpatient Medications  Medication Sig Dispense Refill  . acetaminophen (TYLENOL) 500 MG tablet Take 1,000 mg by mouth 2 (two) times daily as needed for headache.    . albuterol (PROVENTIL HFA;VENTOLIN  HFA) 108 (90 Base) MCG/ACT inhaler Inhale 2 puffs into the lungs every 6 (six) hours as needed for wheezing. 1 Inhaler 0  . Calcium Carb-Cholecalciferol (CALTRATE 600+D) 600-800 MG-UNIT TABS Take 1 tablet by mouth daily. 30 tablet 0  . gabapentin (NEURONTIN) 600 MG tablet Take 2 tablets (1,200 mg total) by mouth 3 (three) times daily. 180 tablet 1  . lactose free nutrition (BOOST PLUS) LIQD Take 237 mLs by mouth 3 (three) times daily with meals. (Patient taking differently: Take 237 mLs by mouth daily. ) 30 Can 11  . linaclotide (LINZESS) 290 MCG CAPS capsule Take 1 capsule (290 mcg total) by mouth daily before breakfast. 30 capsule 5  . magnesium (MAGTAB) 84 MG (7MEQ) TBCR SR tablet Take 1 tablet (84 mg total) by mouth 2 (two) times daily. 14 tablet 0  . naproxen (NAPROSYN) 500 MG tablet Take 1 tablet (500 mg total) by mouth 2 (two) times daily with a meal. (Patient taking differently: Take 500 mg by mouth as needed. ) 30 tablet 0  . ondansetron (ZOFRAN-ODT) 8 MG disintegrating tablet Take 1 tablet (8 mg total) by mouth every 8 (eight) hours as needed for nausea or vomiting. 90 tablet 0  . QUEtiapine (SEROQUEL) 400 MG tablet Take 400 mg by mouth at bedtime.    . potassium chloride (K-DUR) 10 MEQ tablet Take 2 tablets (20 mEq total)  by mouth daily. (Patient not taking: Reported on 09/02/2017) 30 tablet 0   No current facility-administered medications for this visit.     Allergies as of 09/02/2017 - Review Complete 09/02/2017  Allergen Reaction Noted  . Lipitor [atorvastatin] Other (See Comments) 04/11/2017    Family History  Problem Relation Age of Onset  . Diabetes Father   . Hypertension Father 50       heart attack  . Kidney disease Father   . Heart attack Father   . Heart failure Father   . Dementia Father   . Breast cancer Mother 30       lump removed, bile duct cancer  . COPD Mother   . Hypertension Brother   . Allergic rhinitis Paternal Grandfather     Social History    Socioeconomic History  . Marital status: Single    Spouse name: Not on file  . Number of children: Not on file  . Years of education: Not on file  . Highest education level: Not on file  Social Needs  . Financial resource strain: Not on file  . Food insecurity - worry: Not on file  . Food insecurity - inability: Not on file  . Transportation needs - medical: Not on file  . Transportation needs - non-medical: Not on file  Occupational History  . Occupation: unemployed  Tobacco Use  . Smoking status: Current Every Day Smoker    Packs/day: 1.00    Years: 30.00    Pack years: 30.00    Types: Cigarettes  . Smokeless tobacco: Never Used  . Tobacco comment: states "might be interested in the smoking cessation program"  Pt given counseling related to smoking cessation  Substance and Sexual Activity  . Alcohol use: Yes    Comment: couple x a week  . Drug use: No  . Sexual activity: Not Currently  Other Topics Concern  . 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  Currently she is on Linzess 290 mg once daily and this seems to powerful for her son writing her a new prescription for Linzess 145 mg strength.  She will take 1 pill once daily.  I am also try to  bulk up her stool with fiber supplements once daily.  I hope this combination will be more helpful for her.  She will return to see me in 2 months and sooner if any problems.  Please see the "Patient Instructions" section for addition details about the plan.  Rob Bunting, MD [facility] 09/02/2017, 3:14 PM

## 2017-09-02 NOTE — Addendum Note (Signed)
Addended by: Maryjean MornOBERTS, Nou Chard S on: 09/02/2017 04:06 PM   Modules accepted: Orders

## 2017-09-02 NOTE — Progress Notes (Signed)
Catherine Butler was seen today in neurologic consultation at the request of Loletta Specter, PA-C.  The consultation is for the evaluation of chorea.  The records that were made available to me were reviewed. This patient is accompanied in the office by her child who supplements the history.Pt is a 57 y.o. 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  She was treated for such with IVIG.  She had a lumbar puncture but protein was normal at 34 and only 3 WBC.  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  Had absent reflexes and urinary retention.  She was discharged to SNF and then went home after that.  She was readmitted on 06/20/17 after having n/v and felt a likely viral syndrome.  Went to inpatient rehab following this given that strength continued to be poor.  She was in rehab until 07/06/17.  Referral was made to Dr. Lucia Gaskins but patient refused making appt per records.  Was back in the hospital in late January for weight loss, which was ultimately felt due to chronic pancreatitis, EtOH hepatitis.  Colonoscopy was negative for mass.  Went back to to the emergency room in February, 2019 because of continued abdominal pain, which was viral enteritis.  She was not admitted.  Pt presents today to discuss movements of the L and R hand.  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  Records are reviewed and despite the number of providers that she has seen a number of times she has been in the hospital, there are no reports of abnormal movements.    No neuro imaging of the brain has been completed.  She has had the rest of her neural axis imaged with MRI.  It doesn't happen daily but it may happen one time per week.  She is unsure what sets it off but "it may be when I get upset."  She states that "it jerks" and may be gone fast or may last longer but never "all  day."  States that she cannot walk well since initial hospitalization.  Uses walker at home.  Feels that arms and legs will go numb.  On gabapentin for years and states that "since I got GBS, my neuropathy has been in overdrive."  States that she doesn't know etiology for neuropathy.  On seroquel for years for "sleep."  RX by [employer].   ALLERGIES:   Allergies  Allergen Reactions  . Lipitor [Atorvastatin] Other (See Comments)    "really bad leg pain"    CURRENT MEDICATIONS:  Outpatient Encounter Medications as of 09/06/2017  Medication Sig  . acetaminophen (TYLENOL) 500 MG tablet Take 1,000 mg by mouth 2 (two) times daily as needed for headache.  . albuterol (PROVENTIL HFA;VENTOLIN HFA) 108 (90 Base) MCG/ACT inhaler Inhale 2 puffs into the lungs every 6 (six) hours as needed for wheezing.  . Calcium Carb-Cholecalciferol (CALTRATE 600+D) 600-800 MG-UNIT TABS Take 1 tablet by mouth daily.  . ferrous sulfate 325 (65 FE) MG tablet Take 325 mg by mouth daily with breakfast.  . gabapentin (NEURONTIN) 600 MG tablet Take 2 tablets (1,200 mg total) by mouth 3 (three) times daily.  Marland Kitchen lactose free nutrition (BOOST PLUS) LIQD Take 237 mLs by mouth 3 (three) times daily with meals. (Patient taking differently: Take 237 mLs by mouth daily. )  . linaclotide Karlene Einstein)  145 MCG CAPS capsule Take 1 capsule (145 mcg total) by mouth daily before breakfast.  . magnesium (MAGTAB) 84 MG ( ) TBCR SR tablet Take 1 tablet (84 mg total) by mouth 2 (two) times daily.  . naproxen (NAPROSYN) 500 MG tablet Take 1 tablet (500 mg total) by mouth 2 (two) times daily with a meal. (Patient taking differently: Take 500 mg by mouth as needed. )  . ondansetron (ZOFRAN-ODT) 8 MG disintegrating tablet Take 1 tablet (8 mg total) by mouth every 8 (eight) hours as needed for nausea or vomiting.  . potassium chloride (K-DUR) 10 MEQ tablet Take 2 tablets (20 mEq total) by mouth daily.  . QUEtiapine (SEROQUEL) 400 MG tablet Take 400 mg by  mouth at bedtime.  . diazepam (VALIUM) 5 MG tablet Take one tablet 30 minutes prior to MR (on arrival to facility) and one more just prior to MR (if needed)  . [DISCONTINUED] ferrous sulfate 325 (65 FE) MG tablet Take 1 tablet (325 mg total) by mouth daily with breakfast.  . [DISCONTINUED] linaclotide (LINZESS) 290 MCG CAPS capsule Take 1 capsule (290 mcg total) by mouth daily before breakfast.   No facility-administered encounter medications on file as of 09/06/2017.     PAST MEDICAL HISTORY:   Past Medical History:  Diagnosis Date  . Anxiety   . Arthritis   . Asthma   . Complication of anesthesia    nausea  . COPD (chronic obstructive pulmonary disease) (HCC)   . Depression   . H/O alcohol abuse   . Hypertension    pt denies  . PONV (postoperative nausea and vomiting)   . Reflux   . Suicide attempt by multiple drug overdose (HCC) 2014    PAST SURGICAL HISTORY:   Past Surgical History:  Procedure Laterality Date  . COLONOSCOPY WITH PROPOFOL N/A 07/21/2017   Procedure: COLONOSCOPY WITH PROPOFOL;  Surgeon: Rachael Fee, MD;  Location: [facility];  Service: Endoscopy;  Laterality: N/A;  . LAPAROSCOPY    . laporscopy surgery for ovarian cyst  20 years ago  . TONSILLECTOMY    . TUBAL LIGATION      SOCIAL HISTORY:   Social History   Socioeconomic History  . Marital status: Single    Spouse name: Not on file  . Number of children: Not on file  . Years of education: Not on file  . Highest education level: Not on file  Social Needs  . Financial resource strain: Not on file  . Food insecurity - worry: Not on file  . Food insecurity - inability: Not on file  . Transportation needs - medical: Not on file  . Transportation needs - non-medical: Not on file  Occupational History  . Occupation: unemployed    Comment: prior Holiday representative  Tobacco Use  . Smoking status: Current Every Day Smoker    Packs/day: 1.00    Years: 40.00    Pack years: 40.00     Types: Cigarettes  . Smokeless tobacco: Never Used  Substance and Sexual Activity  . Alcohol use: No    Frequency: Never  . Drug use: No  . Sexual activity: Not Currently  Other Topics Concern  . Not on file  Social History Narrative   ** Merged History Encounter **        FAMILY HISTORY:   Family Status  Relation Name Status  . Father  Alive  . Mother  Alive  . Brother  Alive  . PGF  (Not Specified)  .  Son  Alive    ROS:  A complete 10 system review of systems was obtained and was unremarkable apart from what is mentioned above.  PHYSICAL EXAMINATION:    VITALS:   Vitals:   09/06/17 0824  BP: 98/60  Pulse: (!) 112  SpO2: 99%    GEN:  Normal appears female in no acute distress.  Appears stated age. HEENT:  Normocephalic, atraumatic. The mucous membranes are moist. The superficial temporal arteries are without ropiness or tenderness. Cardiovascular: tachycardic.  regular Lungs: Clear to auscultation bilaterally. Neck/Heme: There are no carotid bruits noted bilaterally.  NEUROLOGICAL: Orientation:  The patient is alert and oriented x 3.  Fund of knowledge is appropriate.  Recent and remote memory intact.  Attention span and concentration normal.   Cranial nerves: There is good facial symmetry. The pupils are equal round and reactive to light bilaterally. Fundoscopic exam is attempted but the disc margins are not well visualized bilaterally. Extraocular muscles are intact and visual fields are full to confrontational testing. Speech is fluent and clear but speaks softly. Soft palate rises symmetrically and there is no tongue deviation. Hearing is intact to conversational tone. Tone: Tone is good throughout. Sensation: Sensation is intact to light touch and pinprick throughout (facial, trunk, extremities). Reports decreased pin in stocking distribution.  reports decreased pin in the RIGHT leg compared to the left but the LEFT arm is decreased compared to the right.  No  facial asymmetries in pinprick.  Vibration is decreased distally. There is no extinction with double simultaneous stimulation. There is no sensory dermatomal level identified. Coordination:  The patient has no dysdiadochokinesia.  She is able to perform finger-nose-finger both with the eyes open and closed. Motor: There is decreased grip strength on the left compared to that of the right.  There is diffuse give way weakness with manual motor testing.  Strength is at least 5-/5 in the right upper extremity and at least 4+/5 in the left upper extremity.  Strength is at least 4-/5 in the bilateral lower extremities.  Give way weakness improved somewhat with encouragement. DTR's: Deep tendon reflexes are 0/4 at the bilateral biceps, triceps, brachioradialis, patella and achilles.  Plantar responses are downgoing bilaterally. Gait and Station: The patient is in a wheelchair.  She requires minimal assistance to get out of the wheelchair.  She is given a walker.  Gait is both antalgic and ataxic.  Gait is wide-based.   Abnormal movements: None.  There is no tremor.  There is no chorea.  There is no asterixis.  There is no myoclonus.  Abnormal movements: Lab Results  Component Value Date   TSH 3.309 07/19/2017     Chemistry      Component Value Date/Time   NA 132 (L) 08/29/2017 1637   K 4.2 08/29/2017 1637   CL 99 08/29/2017 1637   CO2 15 (L) 08/29/2017 1637   BUN 7 08/29/2017 1637   CREATININE 0.65 08/29/2017 1637      Component Value Date/Time   CALCIUM 8.8 08/29/2017 1637   ALKPHOS 108 08/22/2017 1204   AST 40 08/22/2017 1204   ALT 24 08/22/2017 1204   BILITOT 0.3 08/22/2017 1204     Lab Results  Component Value Date   PTH CANCELED 08/22/2017   PTH Comment 08/22/2017   CALCIUM 8.8 08/29/2017   CAION 1.25 (H) 03/16/2012      IMPRESSION/PLAN  1.  Abnormal movements, by history  -I saw no abnormal movements on her examination today.  She  reports that they are only present about 1  time per week.  When asked to show me what they might look like if they were present, her description was more of myoclonic than chorea.  I told the patient and her son that it is possible that she had gabapentin induced myoclonus, especially in the face of dehydration.  While seroquel can also produce abnormal movements, I don't suspect this is etiology, given that this movement wouldn't come and go once per week.  Reassurance is provided.  2.  Hx of possible GBS with continued difficulty walking  -Patient has had a very extensive workup.  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  Patient was treated with IVIG.  She continues to report some symptoms.  We will do an MRI of the brain to make sure we are not missing anything, since this is only a portion of the neuro axis that was not scanned.  She is very claustrophobic.  We will do this in an open unit, her son will go in with her and we will give her just enough Valium to mildly sedate her.  -encouraged her to become more active, go to gym for strengthening.  Sitting at home watching TV per pt report.  Lives with elderly parents.  Is able to do own ADL's  3.  Long history of peripheral neuropathy, per patient  -She does have some features of this on examination.  This may be due to her history of alcohol use.  She reports that she is no longer drinking alcohol.  She reports that neuropathy symptoms are getting much worse.  I would recommend follow-up with physiatry/pain management who she saw in the hospital.  4.  F/u will depend on above.  If new movement sx's arise, happy to see her in the future.  Much greater than 50% of this visit was spent in counseling and coordinating care.  Total face to face time:  60 min.  This did not include the 40 min of record review which was detailed above, which was non face to face time.     Cc:  Loletta SpecterGomez, Roger David,  PA-C

## 2017-09-05 ENCOUNTER — Other Ambulatory Visit (INDEPENDENT_AMBULATORY_CARE_PROVIDER_SITE_OTHER): Payer: Self-pay | Admitting: Physician Assistant

## 2017-09-05 DIAGNOSIS — R112 Nausea with vomiting, unspecified: Secondary | ICD-10-CM

## 2017-09-05 NOTE — Telephone Encounter (Signed)
FWD to PCP. Heide Brossart S Renise Gillies, CMA  

## 2017-09-06 ENCOUNTER — Ambulatory Visit: Payer: Medicaid Other | Admitting: Neurology

## 2017-09-06 ENCOUNTER — Encounter: Payer: Self-pay | Admitting: Neurology

## 2017-09-06 VITALS — BP 98/60 | HR 112

## 2017-09-06 DIAGNOSIS — R259 Unspecified abnormal involuntary movements: Secondary | ICD-10-CM | POA: Diagnosis not present

## 2017-09-06 DIAGNOSIS — G3281 Cerebellar ataxia in diseases classified elsewhere: Secondary | ICD-10-CM

## 2017-09-06 DIAGNOSIS — G6289 Other specified polyneuropathies: Secondary | ICD-10-CM

## 2017-09-06 DIAGNOSIS — R29898 Other symptoms and signs involving the musculoskeletal system: Secondary | ICD-10-CM

## 2017-09-06 MED ORDER — DIAZEPAM 5 MG PO TABS: ORAL_TABLET | ORAL | 0 refills | Status: DC

## 2017-09-06 NOTE — Patient Instructions (Signed)
1. We have sent a referral to [facility] for your OPEN MRI. They are located at [address].They will call you directly to schedule. If you do not hear from them please call [phone].  We will give you valium to take only for the MR. You will take one tablet 30 minutes prior to MR (on arrival to facility) and one more just prior to MR (if needed). This has been sent to your pharmacy.

## 2017-09-12 ENCOUNTER — Other Ambulatory Visit (INDEPENDENT_AMBULATORY_CARE_PROVIDER_SITE_OTHER): Payer: Self-pay | Admitting: Physician Assistant

## 2017-09-12 ENCOUNTER — Telehealth (INDEPENDENT_AMBULATORY_CARE_PROVIDER_SITE_OTHER): Payer: Self-pay | Admitting: Physician Assistant

## 2017-09-12 ENCOUNTER — Ambulatory Visit: Payer: Medicaid Other | Attending: Physician Assistant

## 2017-09-12 ENCOUNTER — Encounter: Payer: Self-pay | Admitting: Neurology

## 2017-09-12 DIAGNOSIS — Z760 Encounter for issue of repeat prescription: Secondary | ICD-10-CM

## 2017-09-12 NOTE — Telephone Encounter (Signed)
FWD to PCP. Tennelle Taflinger S Nan Maya, CMA  

## 2017-09-12 NOTE — Telephone Encounter (Signed)
Patient need a refill of Naproxen  And also a new neurology referral for Neuropathy   Thank you

## 2017-09-12 NOTE — Telephone Encounter (Signed)
FWD to PCP. Grier Czerwinski S Koree Staheli, CMA  

## 2017-09-13 LAB — CBC WITH DIFFERENTIAL/PLATELET
BASOS: 1 %
Basophils Absolute: 0 10*3/uL (ref 0.0–0.2)
EOS (ABSOLUTE): 0.1 10*3/uL (ref 0.0–0.4)
EOS: 2 %
Hematocrit: 35.6 % (ref 34.0–46.6)
Hemoglobin: 12.1 g/dL (ref 11.1–15.9)
IMMATURE GRANS (ABS): 0 10*3/uL (ref 0.0–0.1)
IMMATURE GRANULOCYTES: 0 %
LYMPHS: 40 %
Lymphocytes Absolute: 2.1 10*3/uL (ref 0.7–3.1)
MCH: 31.2 pg (ref 26.6–33.0)
MCHC: 34 g/dL (ref 31.5–35.7)
MCV: 92 fL (ref 79–97)
MONOS ABS: 0.6 10*3/uL (ref 0.1–0.9)
Monocytes: 11 %
NEUTROS PCT: 46 %
Neutrophils Absolute: 2.4 10*3/uL (ref 1.4–7.0)
PLATELETS: 251 10*3/uL (ref 150–379)
RBC: 3.88 x10E6/uL (ref 3.77–5.28)
RDW: 14.3 % (ref 12.3–15.4)
WBC: 5.2 10*3/uL (ref 3.4–10.8)

## 2017-09-13 LAB — BASIC METABOLIC PANEL
BUN/Creatinine Ratio: 17 (ref 9–23)
BUN: 9 mg/dL (ref 6–24)
CALCIUM: 9 mg/dL (ref 8.7–10.2)
CO2: 20 mmol/L (ref 20–29)
CREATININE: 0.52 mg/dL — AB (ref 0.57–1.00)
Chloride: 102 mmol/L (ref 96–106)
GFR calc Af Amer: 124 mL/min/{1.73_m2} (ref >59)
GFR, EST NON AFRICAN AMERICAN: 108 mL/min/{1.73_m2} (ref >59)
GLUCOSE: 103 mg/dL — AB (ref 65–99)
POTASSIUM: 4.3 mmol/L (ref 3.5–5.2)
SODIUM: 138 mmol/L (ref 134–144)

## 2017-09-14 ENCOUNTER — Telehealth (INDEPENDENT_AMBULATORY_CARE_PROVIDER_SITE_OTHER): Payer: Self-pay

## 2017-09-14 NOTE — Telephone Encounter (Signed)
Patient is aware of essentially normal labs. Catherine Butler, CMA  

## 2017-09-14 NOTE — Telephone Encounter (Signed)
-----   Message from Loletta Specteroger David Gomez, PA-C sent at 09/13/2017 10:06 AM EDT ----- Blood work is essentially normal.

## 2017-09-15 NOTE — Telephone Encounter (Signed)
Naproxen has been refilled. Also, I don't think she needs another referral as she has already been seen at [facility]. She can call there to make another appointment. She can request another provider if she did not like the last one. Thank you.

## 2017-09-16 ENCOUNTER — Telehealth: Payer: Self-pay | Admitting: Neurology

## 2017-09-16 ENCOUNTER — Encounter: Payer: Self-pay | Admitting: Obstetrics & Gynecology

## 2017-09-16 NOTE — Telephone Encounter (Signed)
I had told her that we didn't do this chronic pain that she has (none of us) and would recommend f/u with pain management for sx control.

## 2017-09-16 NOTE — Telephone Encounter (Signed)
Patient was seen by Dr. Arbutus Leasat on 09/06/17. She called in today needing to make an appointment to see another Neurologist for Neuropathy and Guillain Barre? Please Advise. Thanks

## 2017-09-16 NOTE — Telephone Encounter (Signed)
Last office note states, "Long history of peripheral neuropathy, per patient             -She does have some features of this on examination.  This may be due to her history of alcohol use.  She reports that she is no longer drinking alcohol.  She reports that neuropathy symptoms are getting much worse.  I would recommend follow-up with physiatry/pain management who she saw in the hospital."  Should see make an appt with another neurologist or with physiatry/pain management? Please advise.

## 2017-09-16 NOTE — Telephone Encounter (Signed)
Patient made aware.

## 2017-09-16 NOTE — Telephone Encounter (Signed)
Patient is aware of refill and to call neurology to see if she can schedule appointment, if they say she needs another referral, she will call our office and inform us. Maryjean Mornempestt S Roberts, CMA

## 2017-09-19 ENCOUNTER — Telehealth: Payer: Self-pay | Admitting: Neurology

## 2017-09-19 NOTE — Telephone Encounter (Signed)
Please let patient know that her MRI of the brain was unremarkable, without any acute changes.

## 2017-09-20 NOTE — Telephone Encounter (Signed)
Mychart message sent to patient.

## 2017-09-26 ENCOUNTER — Ambulatory Visit (INDEPENDENT_AMBULATORY_CARE_PROVIDER_SITE_OTHER): Payer: Self-pay | Admitting: Physician Assistant

## 2017-10-06 ENCOUNTER — Encounter: Payer: Medicaid Other | Admitting: Obstetrics & Gynecology

## 2017-10-13 ENCOUNTER — Other Ambulatory Visit: Payer: Self-pay

## 2017-10-13 ENCOUNTER — Ambulatory Visit (INDEPENDENT_AMBULATORY_CARE_PROVIDER_SITE_OTHER): Payer: Medicaid Other | Admitting: Physician Assistant

## 2017-10-13 ENCOUNTER — Encounter (INDEPENDENT_AMBULATORY_CARE_PROVIDER_SITE_OTHER): Payer: Self-pay | Admitting: Physician Assistant

## 2017-10-13 VITALS — BP 105/73 | HR 90 | Temp 97.7°F | Ht 66.0 in | Wt 135.2 lb

## 2017-10-13 DIAGNOSIS — E878 Other disorders of electrolyte and fluid balance, not elsewhere classified: Secondary | ICD-10-CM | POA: Diagnosis not present

## 2017-10-13 DIAGNOSIS — R202 Paresthesia of skin: Secondary | ICD-10-CM

## 2017-10-13 DIAGNOSIS — Z124 Encounter for screening for malignant neoplasm of cervix: Secondary | ICD-10-CM

## 2017-10-13 DIAGNOSIS — J302 Other seasonal allergic rhinitis: Secondary | ICD-10-CM

## 2017-10-13 MED ORDER — LEVOCETIRIZINE DIHYDROCHLORIDE 5 MG PO TABS: 5.0000 mg | ORAL_TABLET | Freq: Every evening | ORAL | 2 refills | Status: DC

## 2017-10-13 MED ORDER — DULOXETINE HCL 30 MG PO CPEP: 30.0000 mg | ORAL_CAPSULE | Freq: Every day | ORAL | 3 refills | Status: DC

## 2017-10-13 NOTE — Patient Instructions (Signed)
Paresthesia Paresthesia is a burning or prickling feeling. This feeling can happen in any part of the body. It often happens in the hands, arms, legs, or feet. Usually, it is not painful. In most cases, the feeling goes away in a short time and is not a sign of a serious problem. Follow these instructions at home:  Avoid drinking alcohol.  Try massage or needle therapy (acupuncture) to help with your problems.  Keep all follow-up visits as told by your doctor. This is important. Contact a doctor if:  You keep on having episodes of paresthesia.  Your burning or prickling feeling gets worse when you walk.  You have pain or cramps.  You feel dizzy.  You have a rash. Get help right away if:  You feel weak.  You have trouble walking or moving.  You have problems speaking, understanding, or seeing.  You feel confused.  You cannot control when you pee (urinate) or poop (bowel movement).  You lose feeling (numbness) after an injury.  You pass out (faint). This information is not intended to replace advice given to you by your health care provider. Make sure you discuss any questions you have with your health care provider. Document Released: 05/20/2008 Document Revised: 11/13/2015 Document Reviewed: 06/03/2014 Elsevier Interactive Patient Education  2018 Elsevier Inc.  

## 2017-10-13 NOTE — Progress Notes (Signed)
Subjective:  Patient ID: Catherine Butler, female    DOB: 09/20/1960  Age: 57 y.o. MRN: [id number]  CC: f/u multiple symptoms  HPI Catherine Butler a 57 y.o.femalewith a PMH of anxiety, asthma,HTNarthritis, COPD, depression, suicide attempt by multiple drug overdose,Guillan Barre,and acid reflux presentson f/u of electrolyte abnormality, paresthesia, constipation, and nausea. Says symptoms have largely resolved and she has been "ok". Main complaint is of continued stinging of the hands and feet. Paresthesia relieved approximately 50% with max Gabapentin dose. Does not endorse any other symptoms or complaints but requests medication for her pollen allergies.      Outpatient Medications Prior to Visit  Medication Sig Dispense Refill  . acetaminophen (TYLENOL) 500 MG tablet Take 1,000 mg by mouth 2 (two) times daily as needed for headache.    . albuterol (PROVENTIL HFA;VENTOLIN HFA) 108 (90 Base) MCG/ACT inhaler Inhale 2 puffs into the lungs every 6 (six) hours as needed for wheezing. 1 Inhaler 0  . Calcium Carb-Cholecalciferol (CALTRATE 600+D) 600-800 MG-UNIT TABS Take 1 tablet by mouth daily. 30 tablet 0  . ferrous sulfate 325 (65 FE) MG tablet Take 325 mg by mouth daily with breakfast.    . gabapentin (NEURONTIN) 600 MG tablet Take 2 tablets (1,200 mg total) by mouth 3 (three) times daily. 180 tablet 1  . magnesium (MAGTAB) 84 MG (7MEQ) TBCR SR tablet Take 1 tablet (84 mg total) by mouth 2 (two) times daily. 14 tablet 0  . naproxen (NAPROSYN) 500 MG tablet TAKE 1 TABLET BY MOUTH TWICE (2) DAILY WITH MEALS 30 tablet 0  . ondansetron (ZOFRAN-ODT) 8 MG disintegrating tablet Take 1 tablet (8 mg total) by mouth every 8 (eight) hours as needed for nausea or vomiting. 90 tablet 0  . QUEtiapine (SEROQUEL) 400 MG tablet Take 400 mg by mouth at bedtime.    . lactose free nutrition (BOOST PLUS) LIQD Take 237 mLs by mouth 3 (three) times daily with meals. (Patient not taking: Reported on 10/13/2017)  30 Can 11  . linaclotide (LINZESS) 145 MCG CAPS capsule Take 1 capsule (145 mcg total) by mouth daily before breakfast. 30 capsule 11  . potassium chloride (K-DUR) 10 MEQ tablet Take 2 tablets (20 mEq total) by mouth daily. (Patient not taking: Reported on 10/13/2017) 30 tablet 0  . diazepam (VALIUM) 5 MG tablet Take one tablet 30 minutes prior to MR (on arrival to facility) and one more just prior to MR (if needed) (Patient not taking: Reported on 10/13/2017) 2 tablet 0   No facility-administered medications prior to visit.      ROS Review of Systems  Constitutional: Negative for chills, fever and malaise/fatigue.  HENT: Positive for congestion.   Eyes: Negative for blurred vision.  Respiratory: Negative for shortness of breath.   Cardiovascular: Negative for chest pain and palpitations.  Gastrointestinal: Negative for abdominal pain and nausea.  Genitourinary: Negative for dysuria and hematuria.  Musculoskeletal: Negative for joint pain and myalgias.  Skin: Negative for rash.  Neurological: Negative for tingling and headaches.       Stinging of hands and feet  Psychiatric/Behavioral: Negative for depression. The patient is not nervous/anxious.     Objective:  BP 105/73 (BP Location: Left Arm, Patient Position: Sitting, Cuff Size: Normal)   Pulse 90   Temp 97.7 F (36.5 C) (Oral)   Ht 5\' 6"  (1.676 m)   Wt 135 lb 3.2 oz (61.3 kg)   LMP 02/22/2006   SpO2 97%   BMI 21.82 kg/m  BP/Weight 10/13/2017 09/06/2017 09/02/2017  Systolic BP 105 98 80  Diastolic BP 73 60 60  Wt. (Lbs) 135.2 - 125.5  BMI 21.82 - 20.26      Physical Exam  Constitutional: She is oriented to person, place, and time. No distress.  Well developed, appears healthier and with less pallor  than previous visits. Using walker for ambulation.  HENT:  Head: Normocephalic and atraumatic.  Eyes: No scleral icterus.  Cardiovascular: Normal rate, regular rhythm and normal heart sounds.  Pulmonary/Chest: Effort  normal and breath sounds normal.  Musculoskeletal: She exhibits tenderness (mild TTP of the LE bilaterally). She exhibits no edema or deformity.  Neurological: She is alert and oriented to person, place, and time.  Skin: Skin is warm and dry. No rash noted. She is not diaphoretic. No erythema. No pallor.  Psychiatric: She has a normal mood and affect. Her behavior is normal. Thought content normal.  Vitals reviewed.    Assessment & Plan:    1. Paresthesia - DULoxetine (CYMBALTA) 30 MG capsule; Take 1 capsule (30 mg total) by mouth daily.  Dispense: 30 capsule; Refill: 3  2. Electrolyte abnormality - Basic Metabolic Panel  3. Seasonal allergies - levocetirizine (XYZAL) 5 MG tablet; Take 1 tablet (5 mg total) by mouth every evening.  Dispense: 30 tablet; Refill: 2  4. Screening for cervical cancer - Ambulatory referral to Gynecology   Meds ordered this encounter  Medications  . DULoxetine (CYMBALTA) 30 MG capsule    Sig: Take 1 capsule (30 mg total) by mouth daily.    Dispense:  30 capsule    Refill:  3    Order Specific Question:   Supervising Provider    Answer:   Quentin Angst [id number]  . levocetirizine (XYZAL) 5 MG tablet    Sig: Take 1 tablet (5 mg total) by mouth every evening.    Dispense:  30 tablet    Refill:  2    Order Specific Question:   Supervising Provider    Answer:   Quentin Angst [id number]    Follow-up: Return in about 3 months (around 01/12/2018) for f/u paresthesia.   Loletta Specter PA

## 2017-10-14 ENCOUNTER — Telehealth (INDEPENDENT_AMBULATORY_CARE_PROVIDER_SITE_OTHER): Payer: Self-pay

## 2017-10-14 ENCOUNTER — Other Ambulatory Visit (INDEPENDENT_AMBULATORY_CARE_PROVIDER_SITE_OTHER): Payer: Self-pay | Admitting: Physician Assistant

## 2017-10-14 LAB — BASIC METABOLIC PANEL
BUN / CREAT RATIO: 10 (ref 9–23)
BUN: 6 mg/dL (ref 6–24)
CO2: 22 mmol/L (ref 20–29)
CREATININE: 0.6 mg/dL (ref 0.57–1.00)
Calcium: 8.6 mg/dL — ABNORMAL LOW (ref 8.7–10.2)
Chloride: 99 mmol/L (ref 96–106)
GFR calc non Af Amer: 103 mL/min/{1.73_m2} (ref >59)
GFR, EST AFRICAN AMERICAN: 119 mL/min/{1.73_m2} (ref >59)
GLUCOSE: 95 mg/dL (ref 65–99)
Potassium: 4.7 mmol/L (ref 3.5–5.2)
Sodium: 135 mmol/L (ref 134–144)

## 2017-10-14 NOTE — Telephone Encounter (Signed)
-----   Message from Loletta Specteroger David Gomez, PA-C sent at 10/14/2017  8:33 AM EDT ----- Slightly low calcium. Take two Vit D and Calcium pills instead of one. Rest electrolytes normal.

## 2017-10-14 NOTE — Telephone Encounter (Signed)
Patient aware of slightly low calcium, take two vitamin D and calcium pills instead of one. Rest electrolytes normal. Catherine Butler, CMA

## 2017-10-18 ENCOUNTER — Telehealth (INDEPENDENT_AMBULATORY_CARE_PROVIDER_SITE_OTHER): Payer: Self-pay | Admitting: Physician Assistant

## 2017-10-18 NOTE — Telephone Encounter (Signed)
Pt called to request nicotine patches,please advise.

## 2017-10-18 NOTE — Telephone Encounter (Signed)
Pt called to request an update on her Gynecology referral, please follow up.

## 2017-10-18 NOTE — Telephone Encounter (Signed)
Can be discussed while in clinic. For the most part, I can not prescribe unless I have a properly documented medical visit with justification for the prescription. I do not do telemedicine.

## 2017-10-18 NOTE — Telephone Encounter (Signed)
FWD to PCP. Tempestt S Roberts, CMA  

## 2017-10-20 NOTE — Telephone Encounter (Signed)
Patient aware and has scheduled appointment. Catherine Butler, CMA  

## 2017-10-20 NOTE — Telephone Encounter (Signed)
Referral sent to another practice  [facility]  Today  Waiting for an appointment

## 2017-10-24 ENCOUNTER — Ambulatory Visit: Payer: Self-pay | Admitting: Gastroenterology

## 2017-10-24 ENCOUNTER — Encounter: Payer: Self-pay | Admitting: Obstetrics & Gynecology

## 2017-10-31 ENCOUNTER — Other Ambulatory Visit (INDEPENDENT_AMBULATORY_CARE_PROVIDER_SITE_OTHER): Payer: Self-pay | Admitting: Physician Assistant

## 2017-10-31 DIAGNOSIS — R202 Paresthesia of skin: Secondary | ICD-10-CM

## 2017-10-31 NOTE — Telephone Encounter (Signed)
FWD to covering provider at [facility]. Tempestt S Roberts, CMA  

## 2017-11-16 ENCOUNTER — Encounter: Payer: Medicaid Other | Admitting: Obstetrics & Gynecology

## 2017-11-22 ENCOUNTER — Other Ambulatory Visit (INDEPENDENT_AMBULATORY_CARE_PROVIDER_SITE_OTHER): Payer: Self-pay | Admitting: Nurse Practitioner

## 2017-11-22 DIAGNOSIS — R202 Paresthesia of skin: Secondary | ICD-10-CM

## 2017-11-24 ENCOUNTER — Ambulatory Visit (INDEPENDENT_AMBULATORY_CARE_PROVIDER_SITE_OTHER): Payer: Medicaid Other | Admitting: Physician Assistant

## 2017-11-30 ENCOUNTER — Telehealth (INDEPENDENT_AMBULATORY_CARE_PROVIDER_SITE_OTHER): Payer: Self-pay | Admitting: Physician Assistant

## 2017-11-30 ENCOUNTER — Other Ambulatory Visit (INDEPENDENT_AMBULATORY_CARE_PROVIDER_SITE_OTHER): Payer: Self-pay | Admitting: Physician Assistant

## 2017-11-30 ENCOUNTER — Other Ambulatory Visit (INDEPENDENT_AMBULATORY_CARE_PROVIDER_SITE_OTHER): Payer: Self-pay | Admitting: Nurse Practitioner

## 2017-11-30 DIAGNOSIS — R202 Paresthesia of skin: Secondary | ICD-10-CM

## 2017-11-30 MED ORDER — GABAPENTIN 600 MG PO TABS
ORAL_TABLET | ORAL | 0 refills | Status: DC
Start: 2017-11-30 — End: 2017-12-29

## 2017-11-30 NOTE — Telephone Encounter (Signed)
Fwd to PCP. Candelario Steppe S Horacio Werth, CMA  

## 2017-11-30 NOTE — Telephone Encounter (Signed)
Sent refill to her pharmacy. Please notify.

## 2017-11-30 NOTE — Telephone Encounter (Signed)
Pt called to request a medication refill on -gabapentin (NEURONTIN) 600 MG tablet  To -[facility] - [place] - [address] Please follow up

## 2017-12-01 NOTE — Telephone Encounter (Signed)
Patient aware. Tempestt S Roberts, CMA  

## 2017-12-05 ENCOUNTER — Other Ambulatory Visit: Payer: Self-pay

## 2017-12-05 ENCOUNTER — Ambulatory Visit (INDEPENDENT_AMBULATORY_CARE_PROVIDER_SITE_OTHER): Payer: Medicaid Other | Admitting: Physician Assistant

## 2017-12-05 ENCOUNTER — Encounter (INDEPENDENT_AMBULATORY_CARE_PROVIDER_SITE_OTHER): Payer: Self-pay | Admitting: Physician Assistant

## 2017-12-05 VITALS — BP 106/82 | HR 73 | Temp 97.9°F | Ht 66.0 in | Wt 130.8 lb

## 2017-12-05 DIAGNOSIS — Z760 Encounter for issue of repeat prescription: Secondary | ICD-10-CM | POA: Diagnosis not present

## 2017-12-05 DIAGNOSIS — Z124 Encounter for screening for malignant neoplasm of cervix: Secondary | ICD-10-CM | POA: Diagnosis not present

## 2017-12-05 DIAGNOSIS — F17200 Nicotine dependence, unspecified, uncomplicated: Secondary | ICD-10-CM

## 2017-12-05 MED ORDER — VARENICLINE TARTRATE 0.5 MG X 11 & 1 MG X 42 PO MISC: ORAL | 0 refills | Status: DC

## 2017-12-05 MED ORDER — ALBUTEROL SULFATE HFA 108 (90 BASE) MCG/ACT IN AERS: 2.0000 | INHALATION_SPRAY | Freq: Four times a day (QID) | RESPIRATORY_TRACT | 11 refills | Status: DC | PRN

## 2017-12-05 NOTE — Progress Notes (Signed)
Subjective:  Patient ID: Catherine Butler, female    DOB: 09/04/1960  Age: 57 y.o. MRN: [id number]  CC: needs nicotine patches  HPI Catherine Butler a 57 y.o.femalewith a PMH of anxiety, asthma,HTNarthritis, COPD, depression, suicide attempt by multiple drug overdose,Guillan Barre,and acid reflux presentsfor tobacco cessation. Began smoking when she was 57 yo. Most she has ever smoked per day was two packs. Currently smokes one to one and a half packs per day. Has used nicotine patches with temporary success for approximately 3-4 weeks. Has never used Chantix. Has never seen a pulmonologist. Chest CT 04/18/2017 revealed no suspicious pulmonar nodules or mass and also no mediastinal adenopathy. Grandfather had lung cancer. Current symptoms include occasional cough and some shortness of breath with exertion. Denies clotting issues or personal history of cancer.        Outpatient Medications Prior to Visit  Medication Sig Dispense Refill  . acetaminophen (TYLENOL) 500 MG tablet Take 1,000 mg by mouth 2 (two) times daily as needed for headache.    . Calcium Carb-Cholecalciferol (CALTRATE 600+D) 600-800 MG-UNIT TABS Take 1 tablet by mouth daily. 30 tablet 0  . DULoxetine (CYMBALTA) 30 MG capsule Take 1 capsule (30 mg total) by mouth daily. 30 capsule 3  . ferrous sulfate 325 (65 FE) MG tablet Take 325 mg by mouth daily with breakfast.    . gabapentin (NEURONTIN) 600 MG tablet TAKE 2 TABLETS BY MOUTH 3 TIMES DAILY 180 tablet 0  . magnesium (MAGTAB) 84 MG (7MEQ) TBCR SR tablet Take 1 tablet (84 mg total) by mouth 2 (two) times daily. 14 tablet 0  . QUEtiapine (SEROQUEL) 400 MG tablet Take 400 mg by mouth at bedtime.    Marland Kitchen. albuterol (PROVENTIL HFA;VENTOLIN HFA) 108 (90 Base) MCG/ACT inhaler Inhale 2 puffs into the lungs every 6 (six) hours as needed for wheezing. (Patient not taking: Reported on 12/05/2017) 1 Inhaler 0  . lactose free nutrition (BOOST PLUS) LIQD Take 237 mLs by mouth 3 (three)  times daily with meals. (Patient not taking: Reported on 10/13/2017) 30 Can 11  . levocetirizine (XYZAL) 5 MG tablet Take 1 tablet (5 mg total) by mouth every evening. (Patient not taking: Reported on 12/05/2017) 30 tablet 2  . linaclotide (LINZESS) 145 MCG CAPS capsule Take 1 capsule (145 mcg total) by mouth daily before breakfast. 30 capsule 11  . naproxen (NAPROSYN) 500 MG tablet TAKE 1 TABLET BY MOUTH TWICE (2) DAILY WITH MEALS 30 tablet 0  . potassium chloride (K-DUR) 10 MEQ tablet Take 2 tablets (20 mEq total) by mouth daily. (Patient not taking: Reported on 12/05/2017) 30 tablet 0  . ondansetron (ZOFRAN-ODT) 8 MG disintegrating tablet Take 1 tablet (8 mg total) by mouth every 8 (eight) hours as needed for nausea or vomiting. 90 tablet 0   No facility-administered medications prior to visit.      ROS Review of Systems  Constitutional: Negative for chills, fever and malaise/fatigue.  Eyes: Negative for blurred vision.  Respiratory: Positive for cough and shortness of breath.   Cardiovascular: Negative for chest pain and palpitations.  Gastrointestinal: Negative for abdominal pain and nausea.  Genitourinary: Negative for dysuria and hematuria.  Musculoskeletal: Negative for joint pain and myalgias.  Skin: Negative for rash.  Neurological: Negative for tingling and headaches.  Psychiatric/Behavioral: Negative for depression. The patient is not nervous/anxious.     Objective:  BP 106/82 (BP Location: Right Arm, Patient Position: Sitting, Cuff Size: Normal)   Pulse 73   Temp 97.9 F (  36.6 C) (Oral)   Ht 5\' 6"  (1.676 m)   Wt 130 lb 12.8 oz (59.3 kg)   LMP 02/22/2006   SpO2 99%   BMI 21.11 kg/m   BP/Weight 12/05/2017 10/13/2017 09/06/2017  Systolic BP 106 105 98  Diastolic BP 82 73 60  Wt. (Lbs) 130.8 135.2 -  BMI 21.11 21.82 -      Physical Exam  Constitutional: She is oriented to person, place, and time.  Well developed, somewhat frail, NAD, polite, using walker for  ambulation  HENT:  Head: Normocephalic and atraumatic.  Eyes: No scleral icterus.  Neck: Normal range of motion. Neck supple. No thyromegaly present.  Cardiovascular: Normal rate, regular rhythm and normal heart sounds.  Pulmonary/Chest: Effort normal and breath sounds normal. No stridor. No respiratory distress. She has no wheezes. She has no rales.  Musculoskeletal: She exhibits no edema.  Thoracic spine and c spine kyphosis  Neurological: She is alert and oriented to person, place, and time.  Skin: Skin is warm and dry. No rash noted. No erythema. No pallor.  Psychiatric: She has a normal mood and affect. Her behavior is normal. Thought content normal.  Vitals reviewed.    Assessment & Plan:    1. Tobacco use disorder - Begin varenicline (CHANTIX STARTING MONTH PAK) 0.5 MG X 11 & 1 MG X 42 tablet; Take one 0.5 mg tablet by mouth once daily for 3 days, then increase to one 0.5 mg tablet twice daily for 4 days, then increase to one 1 mg tablet twice daily.  Dispense: 53 tablet; Refill: 0  2. Medication refill - Albuterol 108 mcg/ACT inhaler x1 refills 11  3. Screening for cervical cancer - Ambulatory referral to Gynecology. Prefers female GYN for Pap smear.    Meds ordered this encounter  Medications  . varenicline (CHANTIX STARTING MONTH PAK) 0.5 MG X 11 & 1 MG X 42 tablet    Sig: Take one 0.5 mg tablet by mouth once daily for 3 days, then increase to one 0.5 mg tablet twice daily for 4 days, then increase to one 1 mg tablet twice daily.    Dispense:  53 tablet    Refill:  0    Order Specific Question:   Supervising Provider    Answer:   Hoy Register [[id number]]    Follow-up: Return in about 3 months (around 03/07/2018), or if symptoms worsen or fail to improve.   Loletta Specter PA

## 2017-12-05 NOTE — Patient Instructions (Signed)

## 2017-12-14 ENCOUNTER — Ambulatory Visit (INDEPENDENT_AMBULATORY_CARE_PROVIDER_SITE_OTHER): Payer: Medicaid Other | Admitting: Physician Assistant

## 2017-12-15 ENCOUNTER — Other Ambulatory Visit (INDEPENDENT_AMBULATORY_CARE_PROVIDER_SITE_OTHER): Payer: Self-pay | Admitting: Physician Assistant

## 2017-12-15 DIAGNOSIS — R202 Paresthesia of skin: Secondary | ICD-10-CM

## 2017-12-16 ENCOUNTER — Encounter: Payer: Self-pay | Admitting: General Practice

## 2017-12-28 ENCOUNTER — Telehealth (INDEPENDENT_AMBULATORY_CARE_PROVIDER_SITE_OTHER): Payer: Self-pay | Admitting: Physician Assistant

## 2017-12-28 ENCOUNTER — Other Ambulatory Visit (INDEPENDENT_AMBULATORY_CARE_PROVIDER_SITE_OTHER): Payer: Self-pay | Admitting: Physician Assistant

## 2017-12-28 DIAGNOSIS — R202 Paresthesia of skin: Secondary | ICD-10-CM

## 2017-12-28 NOTE — Telephone Encounter (Signed)
FWD to PCP. Tempestt S Roberts, CMA  

## 2017-12-28 NOTE — Telephone Encounter (Signed)
Patient called about medication refill  Please Follow Up   Thank you

## 2017-12-28 NOTE — Telephone Encounter (Signed)
Patient called requesting medication refill for  gabapentin (NEURONTIN) 600 MG tablet   Patient uses [facility] - [place], [place] - [address]  Please Follow Up [phone]  Thank you

## 2017-12-29 ENCOUNTER — Other Ambulatory Visit (INDEPENDENT_AMBULATORY_CARE_PROVIDER_SITE_OTHER): Payer: Self-pay | Admitting: Physician Assistant

## 2017-12-29 DIAGNOSIS — R202 Paresthesia of skin: Secondary | ICD-10-CM

## 2017-12-29 MED ORDER — GABAPENTIN 600 MG PO TABS: ORAL_TABLET | ORAL | 2 refills | Status: DC

## 2017-12-29 NOTE — Telephone Encounter (Signed)
Refill sent.

## 2017-12-29 NOTE — Telephone Encounter (Signed)
Request has been sent to PCP. Maryjean Mornempestt S Roberts, CMA

## 2018-01-10 ENCOUNTER — Other Ambulatory Visit: Payer: Self-pay | Admitting: Gastroenterology

## 2018-01-10 NOTE — Telephone Encounter (Signed)
The pt states that she would like to resume 290 mcg of linzess and would also like a prescription for nexium.  She states she has been taking OTC nexium and its cheaper to get a prescription. She is advised Dr Christella HartiganJacobs is out of the office until Tues.

## 2018-01-11 ENCOUNTER — Other Ambulatory Visit: Payer: Self-pay

## 2018-01-11 MED ORDER — ESOMEPRAZOLE MAGNESIUM 40 MG PO PACK: 40.0000 mg | PACK | Freq: Every day | ORAL | 11 refills | Status: DC

## 2018-01-11 MED ORDER — LINACLOTIDE 290 MCG PO CAPS: 290.0000 ug | ORAL_CAPSULE | Freq: Every day | ORAL | 3 refills | Status: DC

## 2018-01-11 NOTE — Telephone Encounter (Signed)
At time of last OV the 290 was too powerful for her. I'm happy if she wants to try it again however. OK to prescribe linzess 290 one pill once daily, 30 pills with 3 refills. Ok for nexium rx as well, 40mg  pills, once pill once daily 30 pills with 11 refills.  Needs rov in 2 months  thanks

## 2018-01-11 NOTE — Telephone Encounter (Signed)
Scripts sent to pharmacy for pt. No available appts for Dr. Christella HartiganJacobs. Will need appt scheduled when October schedule opens up.

## 2018-01-12 ENCOUNTER — Other Ambulatory Visit: Payer: Self-pay

## 2018-01-12 ENCOUNTER — Encounter: Payer: Self-pay | Admitting: Obstetrics and Gynecology

## 2018-01-12 ENCOUNTER — Ambulatory Visit (INDEPENDENT_AMBULATORY_CARE_PROVIDER_SITE_OTHER): Payer: Medicaid Other | Admitting: Obstetrics and Gynecology

## 2018-01-12 ENCOUNTER — Other Ambulatory Visit (HOSPITAL_COMMUNITY)
Admission: RE | Admit: 2018-01-12 | Discharge: 2018-01-12 | Disposition: A | Discharge status: Home / Self Care | Payer: Medicaid Other | Source: Ambulatory Visit | Attending: Obstetrics and Gynecology | Admitting: Obstetrics and Gynecology

## 2018-01-12 ENCOUNTER — Telehealth (INDEPENDENT_AMBULATORY_CARE_PROVIDER_SITE_OTHER): Payer: Self-pay

## 2018-01-12 ENCOUNTER — Other Ambulatory Visit (INDEPENDENT_AMBULATORY_CARE_PROVIDER_SITE_OTHER): Payer: Self-pay | Admitting: Physician Assistant

## 2018-01-12 VITALS — BP 145/95 | HR 91 | Temp 97.8°F | Ht 66.0 in | Wt 149.0 lb

## 2018-01-12 DIAGNOSIS — Z0000 Encounter for general adult medical examination without abnormal findings: Secondary | ICD-10-CM

## 2018-01-12 DIAGNOSIS — Z124 Encounter for screening for malignant neoplasm of cervix: Secondary | ICD-10-CM

## 2018-01-12 NOTE — Telephone Encounter (Signed)
Patient stopped by to inform PCP of her tachycardia. Maryjean Mornempestt S Roberts, CMA

## 2018-01-12 NOTE — Progress Notes (Signed)
GYNECOLOGY CLINIC ANNUAL PREVENTATIVE CARE ENCOUNTER NOTE  Subjective:   Catherine Butler is a 57 y.o. G7P1011 female here for a routine annual gynecologic exam.  Current complaints: none.   Denies abnormal vaginal bleeding, discharge, pelvic pain, problems with intercourse or other gynecologic concerns.    Gynecologic History Patient's last menstrual period was 02/22/2006. Contraception: abstinence. Last SI was 2009. Last Pap: 2009. Results were: normal per pt Last mammogram: never had. Results were: n/a  Obstetric History OB History  Gravida Para Term Preterm AB Living  3 1 1   1 1   SAB TAB Ectopic Multiple Live Births  1            # Outcome Date GA Lbr Len/2nd Weight Sex Delivery Anes PTL Lv  3 Gravida           2 Term           1 SAB             Past Medical History:  Diagnosis Date  . Anxiety   . Arthritis   . Asthma   . Complication of anesthesia    nausea  . COPD (chronic obstructive pulmonary disease) (HCC)   . Depression   . H/O alcohol abuse   . Hypertension    pt denies  . PONV (postoperative nausea and vomiting)   . Reflux   . Suicide attempt by multiple drug overdose (HCC) 2014    Past Surgical History:  Procedure Laterality Date  . COLONOSCOPY WITH PROPOFOL N/A 07/21/2017   Procedure: COLONOSCOPY WITH PROPOFOL;  Surgeon: Rachael Fee, MD;  Location: [facility];  Service: Endoscopy;  Laterality: N/A;  . LAPAROSCOPY    . laporscopy surgery for ovarian cyst  20 years ago  . TONSILLECTOMY    . TUBAL LIGATION      Current Outpatient Medications on File Prior to Visit  Medication Sig Dispense Refill  . acetaminophen (TYLENOL) 500 MG tablet Take 1,000 mg by mouth 2 (two) times daily as needed for headache.    . albuterol (PROVENTIL HFA;VENTOLIN HFA) 108 (90 Base) MCG/ACT inhaler Inhale 2 puffs into the lungs every 6 (six) hours as needed for wheezing. 1 Inhaler 11  . DULoxetine (CYMBALTA) 30 MG capsule Take 1 capsule (30 mg total) by mouth daily. 30  capsule 3  . esomeprazole (NEXIUM) 40 MG packet Take 40 mg by mouth daily before breakfast. 30 each 11  . gabapentin (NEURONTIN) 600 MG tablet TAKE 2 TABLETS BY MOUTH 3 TIMES DAILY 180 tablet 2  . linaclotide (LINZESS) 290 MCG CAPS capsule Take 1 capsule (290 mcg total) by mouth daily before breakfast. 30 capsule 3  . naproxen (NAPROSYN) 500 MG tablet TAKE 1 TABLET BY MOUTH TWICE (2) DAILY WITH MEALS 30 tablet 0  . QUEtiapine (SEROQUEL) 400 MG tablet Take 400 mg by mouth at bedtime.    . varenicline (CHANTIX STARTING MONTH PAK) 0.5 MG X 11 & 1 MG X 42 tablet Take one 0.5 mg tablet by mouth once daily for 3 days, then increase to one 0.5 mg tablet twice daily for 4 days, then increase to one 1 mg tablet twice daily. 53 tablet 0  . Calcium Carb-Cholecalciferol (CALTRATE 600+D) 600-800 MG-UNIT TABS Take 1 tablet by mouth daily. (Patient not taking: Reported on 01/12/2018) 30 tablet 0  . ferrous sulfate 325 (65 FE) MG tablet Take 325 mg by mouth daily with breakfast.    . lactose free nutrition (BOOST PLUS) LIQD Take 237 mLs by  mouth 3 (three) times daily with meals. (Patient not taking: Reported on 10/13/2017) 30 Can 11  . linaclotide (LINZESS) 145 MCG CAPS capsule Take 1 capsule (145 mcg total) by mouth daily before breakfast. 30 capsule 11  . magnesium (MAGTAB) 84 MG (7MEQ) TBCR SR tablet Take 1 tablet (84 mg total) by mouth 2 (two) times daily. (Patient not taking: Reported on 01/12/2018) 14 tablet 0  . potassium chloride (K-DUR) 10 MEQ tablet Take 2 tablets (20 mEq total) by mouth daily. (Patient not taking: Reported on 12/05/2017) 30 tablet 0   No current facility-administered medications on file prior to visit.     Allergies  Allergen Reactions  . Lipitor [Atorvastatin] Other (See Comments)    "really bad leg pain"    Social History   Socioeconomic History  . Marital status: Single    Spouse name: Not on file  . Number of children: Not on file  . Years of education: Not on file  .  Highest education level: Not on file  Occupational History  . 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 Financial resource strain: Not on file  . Food insecurity:    Worry: Not on file    Inability: Not on file  . Transportation needs:    Medical: Not on file    Non-medical: Not on file  Tobacco Use  . Smoking status: Current Every Day Smoker    Packs/day: 1.00    Years: 40.00    Pack years: 40.00    Types: Cigarettes  . Smokeless tobacco: Never Used  Substance and Sexual Activity  . Alcohol use: No    Frequency: Never  . Drug use: No  . Sexual activity: Not Currently  Lifestyle  . Physical activity:    Days per week: Not on file    Minutes per session: Not on file  . Stress: Not on file  Relationships  . Social connections:    Talks on phone: Not on file    Gets together: Not on file    Attends religious service: Not on file    Active member of club or organization: Not on file    Attends meetings of clubs or organizations: Not on file    Relationship status: Not on file  . Intimate partner violence:    Fear of current or ex partner: Not on file    Emotionally abused: Not on file    Physically abused: Not on file    Forced sexual activity: Not on file  Other Topics Concern  . Not on file  Social History Narrative   ** Merged History Encounter **        Family History  Problem Relation Age of Onset  . Diabetes Father   . Hypertension Father 50       heart attack  . Kidney disease Father   . Heart attack Father   . Heart failure Father   . Dementia Father   . Breast cancer Mother 30       lump removed, bile duct cancer  . COPD Mother   . Hypertension Brother   . Allergic rhinitis Paternal Grandfather     The following portions of the patient's history were reviewed and updated as appropriate: allergies, current medications, past family history, past medical history, past social history, past surgical history  and problem list.  Review of Systems Constitutional: negative Eyes: negative Ears, nose, mouth, throat, and face: negative Respiratory: negative  Cardiovascular: negative Gastrointestinal: negative Genitourinary:negative Integument/breast: negative Hematologic/lymphatic: negative Musculoskeletal:positive for muscle weakness Neurological: negative Behavioral/Psych: positive for anxiety Endocrine: negative Allergic/Immunologic: negative   Objective:  BP (!) 145/95 (BP Location: Right Arm, Patient Position: Sitting, Cuff Size: Normal)   Pulse 91   Temp 97.8 F (36.6 C) (Oral)   Ht 5\' 6"  (1.676 m)   Wt 149 lb (67.6 kg)   LMP 02/22/2006   SpO2 99%   BMI 24.05 kg/m  CONSTITUTIONAL: Well-developed, well-nourished female in no acute distress.  HENT:  Normocephalic, atraumatic, External right and left ear normal. Oropharynx is clear and moist EYES: Conjunctivae and EOM are normal. Pupils are equal, round, and reactive to light. No scleral icterus.  NECK: Normal range of motion, supple, no masses.  Normal thyroid.  SKIN: Skin is warm and dry. No rash noted. Not diaphoretic. No erythema. No pallor. NEUROLGIC: Alert and oriented to person, place, and time. Normal reflexes, muscle tone coordination. No cranial nerve deficit noted. PSYCHIATRIC: Normal mood and affect. Normal behavior. Normal judgment and thought content. CARDIOVASCULAR: Normal heart rate noted, regular rhythm RESPIRATORY: Clear to auscultation bilaterally. Effort and breath sounds normal, no problems with respiration noted. BREASTS: Symmetric in size. No masses, skin changes, nipple drainage, or lymphadenopathy. ABDOMEN: Soft, normal bowel sounds, no distention noted.  No tenderness, rebound or guarding.  PELVIC: Normal appearing external genitalia; normal appearing vaginal mucosa and cervix.  No abnormal discharge noted.  Pap smear obtained.  Normal uterine size, no other palpable masses, no uterine or adnexal  tenderness. MUSCULOSKELETAL: Normal range of motion. No tenderness.  No cyanosis, clubbing, or edema.  2+ distal pulses.   Assessment:  Annual gynecologic examination with pap smear   Plan:   Will follow up results of pap smear and manage accordingly. Information given to schedule mammogram with [facility] Routine preventative health maintenance measures emphasized. Patient to f/u with PCP. Please refer to After Visit Summary for other counseling recommendations.   Raelyn Mora, CNM  01/12/2018 4:33 PM

## 2018-01-12 NOTE — Progress Notes (Signed)
pp

## 2018-01-12 NOTE — Telephone Encounter (Signed)
I have reviewed her previous notes and recent visits show she was not tachycardic. I will have to recommend for her to go to urgent care to verify tachycardia and to possibly have an EKG done.

## 2018-01-13 LAB — CYTOLOGY - PAP
DIAGNOSIS: NEGATIVE
HPV (WINDOPATH): NOT DETECTED

## 2018-01-13 NOTE — Telephone Encounter (Signed)
Patient has been informed to go to UC to verify tachycardia and possibly have EKG done. Maryjean Mornempestt S Selah Zelman, CMA

## 2018-01-17 ENCOUNTER — Telehealth: Payer: Self-pay | Admitting: *Deleted

## 2018-01-17 NOTE — Telephone Encounter (Signed)
-----   Message from Raelyn Moraolitta Dawson, [place] sent at 01/13/2018  9:02 PM EDT ----- Please notify patient of normal Pap results and make sure she called to get mammogram scheduled.

## 2018-01-17 NOTE — Telephone Encounter (Signed)
Patient informed of PAP results that was normal. Patient stated she will call for mammogram appointment today.  Clovis PuMartin, Tamika L, RN

## 2018-01-20 DIAGNOSIS — I10 Essential (primary) hypertension: Secondary | ICD-10-CM

## 2018-02-08 ENCOUNTER — Other Ambulatory Visit (INDEPENDENT_AMBULATORY_CARE_PROVIDER_SITE_OTHER): Payer: Self-pay | Admitting: Physician Assistant

## 2018-02-08 DIAGNOSIS — R202 Paresthesia of skin: Secondary | ICD-10-CM

## 2018-02-08 NOTE — Telephone Encounter (Signed)
FWD to PCP. Trexton Escamilla S Lamonte Hartt, CMA  

## 2018-02-28 ENCOUNTER — Other Ambulatory Visit (INDEPENDENT_AMBULATORY_CARE_PROVIDER_SITE_OTHER): Payer: Self-pay | Admitting: Physician Assistant

## 2018-02-28 NOTE — Telephone Encounter (Signed)
FWD to PCP. Tempestt S Roberts, CMA  

## 2018-03-16 ENCOUNTER — Other Ambulatory Visit (INDEPENDENT_AMBULATORY_CARE_PROVIDER_SITE_OTHER): Payer: Self-pay | Admitting: Physician Assistant

## 2018-03-16 DIAGNOSIS — R202 Paresthesia of skin: Secondary | ICD-10-CM

## 2018-03-17 NOTE — Telephone Encounter (Signed)
FWD to PCP. Tempestt S Roberts, CMA  

## 2018-03-27 ENCOUNTER — Other Ambulatory Visit (INDEPENDENT_AMBULATORY_CARE_PROVIDER_SITE_OTHER): Payer: Self-pay | Admitting: Physician Assistant

## 2018-03-27 NOTE — Telephone Encounter (Signed)
FWD to PCP. Tempestt S Roberts, CMA  

## 2018-03-28 ENCOUNTER — Other Ambulatory Visit: Payer: Self-pay

## 2018-03-28 ENCOUNTER — Ambulatory Visit (INDEPENDENT_AMBULATORY_CARE_PROVIDER_SITE_OTHER): Payer: Medicaid Other | Admitting: Physician Assistant

## 2018-03-28 ENCOUNTER — Encounter (INDEPENDENT_AMBULATORY_CARE_PROVIDER_SITE_OTHER): Payer: Self-pay | Admitting: Physician Assistant

## 2018-03-28 VITALS — BP 117/82 | HR 102 | Temp 97.7°F | Ht 66.0 in | Wt 155.6 lb

## 2018-03-28 DIAGNOSIS — F17200 Nicotine dependence, unspecified, uncomplicated: Secondary | ICD-10-CM | POA: Diagnosis not present

## 2018-03-28 DIAGNOSIS — R202 Paresthesia of skin: Secondary | ICD-10-CM

## 2018-03-28 DIAGNOSIS — R05 Cough: Secondary | ICD-10-CM | POA: Diagnosis not present

## 2018-03-28 DIAGNOSIS — R059 Cough, unspecified: Secondary | ICD-10-CM

## 2018-03-28 MED ORDER — ASPIRIN EC 81 MG PO TBEC: 81.0000 mg | DELAYED_RELEASE_TABLET | Freq: Every day | ORAL | 3 refills | Status: AC

## 2018-03-28 MED ORDER — DM-APAP-CPM 15-500-2 MG PO TABS: 2.0000 | ORAL_TABLET | Freq: Four times a day (QID) | ORAL | 0 refills | Status: AC

## 2018-03-28 MED ORDER — AMOXICILLIN-POT CLAVULANATE 875-125 MG PO TABS: 1.0000 | ORAL_TABLET | Freq: Two times a day (BID) | ORAL | 0 refills | Status: AC

## 2018-03-28 MED ORDER — NICOTINE 21 MG/24HR TD PT24: 21.0000 mg | MEDICATED_PATCH | Freq: Every day | TRANSDERMAL | 1 refills | Status: DC

## 2018-03-28 NOTE — Patient Instructions (Signed)

## 2018-03-28 NOTE — Progress Notes (Signed)
Subjective:  Patient ID: Catherine Butler, female    DOB: Jul 27, 1960  Age: 57 y.o. MRN: [id number]  CC: numbness left leg/arm, cough  HPI Catherine Butler a 57 y.o.femalewith a PMH of anxiety, asthma,HTNarthritis, COPD, depression, suicide attempt by multiple drug overdose,Guillan Barre,and acid reflux presents numbness of the left leg and arm since one month ago. Attributed to a slip and fall. Does not have neck, back, shoulder, or hip pain. Denies headache, facial numbness, slurred speech, cognitive deficits, visual deficits, weakness, limited aROM, CP, palpitations, swelling, erythema, ecchymosis, or rash.    Complains of cough since two weeks ago. Exposed to sick family members. Coughs up yellowish/greenish phlegm. Has taken Mucinex with little to no relief. Does not endorse any other associated symptoms or complaints. Continues to smoke. Stopped taking Chantix due to ineffectiveness. Patient also drinks alcohol. Last drink yesterday. Drinks liquors but is unable to quantify how much she drinks to include yesterday's drink/drinks.     Outpatient Medications Prior to Visit  Medication Sig Dispense Refill  . acetaminophen (TYLENOL) 500 MG tablet Take 1,000 mg by mouth 2 (two) times daily as needed for headache.    . albuterol (PROVENTIL HFA;VENTOLIN HFA) 108 (90 Base) MCG/ACT inhaler Inhale 2 puffs into the lungs every 6 (six) hours as needed for wheezing. 1 Inhaler 11  . DULoxetine (CYMBALTA) 30 MG capsule TAKE 1 CAPSULE BY MOUTH ONCE DAILY 30 capsule 3  . esomeprazole (NEXIUM) 40 MG packet Take 40 mg by mouth daily before breakfast. 30 each 11  . gabapentin (NEURONTIN) 600 MG tablet TAKE 2 TABLETS BY MOUTH 3 TIMES DAILY 180 tablet 2  . hydrOXYzine (ATARAX/VISTARIL) 25 MG tablet Take 1 tablet (25 mg total) by mouth at bedtime. 30 tablet 1  . linaclotide (LINZESS) 290 MCG CAPS capsule Take 1 capsule (290 mcg total) by mouth daily before breakfast. 30 capsule 3  . QUEtiapine (SEROQUEL) 400  MG tablet Take 400 mg by mouth at bedtime.    . ferrous sulfate 325 (65 FE) MG tablet Take 325 mg by mouth daily with breakfast.    . lactose free nutrition (BOOST PLUS) LIQD Take 237 mLs by mouth 3 (three) times daily with meals. (Patient not taking: Reported on 10/13/2017) 30 Can 11  . linaclotide (LINZESS) 145 MCG CAPS capsule Take 1 capsule (145 mcg total) by mouth daily before breakfast. 30 capsule 11  . varenicline (CHANTIX STARTING MONTH PAK) 0.5 MG X 11 & 1 MG X 42 tablet Take one 0.5 mg tablet by mouth once daily for 3 days, then increase to one 0.5 mg tablet twice daily for 4 days, then increase to one 1 mg tablet twice daily. (Patient not taking: Reported on 03/28/2018) 53 tablet 0  . Calcium Carb-Cholecalciferol (CALTRATE 600+D) 600-800 MG-UNIT TABS Take 1 tablet by mouth daily. 30 tablet 0  . magnesium (MAGTAB) 84 MG ( ) TBCR SR tablet Take 1 tablet (84 mg total) by mouth 2 (two) times daily. (Patient not taking: Reported on 01/12/2018) 14 tablet 0  . naproxen (NAPROSYN) 500 MG tablet TAKE 1 TABLET BY MOUTH TWICE (2) DAILY WITH MEALS 30 tablet 0  . potassium chloride (K-DUR) 10 MEQ tablet Take 2 tablets (20 mEq total) by mouth daily. (Patient not taking: Reported on 12/05/2017) 30 tablet 0   No facility-administered medications prior to visit.      ROS Review of Systems  Constitutional: Negative for chills, fever and malaise/fatigue.  Eyes: Negative for blurred vision.  Respiratory: Positive for cough. Negative  for shortness of breath.   Cardiovascular: Negative for chest pain and palpitations.  Gastrointestinal: Negative for abdominal pain and nausea.  Genitourinary: Negative for dysuria and hematuria.  Musculoskeletal: Negative for joint pain and myalgias.  Skin: Negative for rash.  Neurological: Positive for tingling. Negative for headaches.       Numbness of left arm and left leg  Psychiatric/Behavioral: Negative for depression. The patient is not nervous/anxious.      Objective:  Ht 5\' 6"  (1.676 m)   Wt 155 lb 9.6 oz (70.6 kg)   LMP 02/22/2006   BMI 25.11 kg/m   BP/Weight 03/28/2018 01/12/2018 12/05/2017  Systolic BP - 145 106  Diastolic BP - 95 82  Wt. (Lbs) 155.6 149 130.8  BMI 25.11 24.05 21.11      Physical Exam  Constitutional: She is oriented to person, place, and time.  Well developed, well nourished, NAD, reserved, smells of beer, not inebriated.  HENT:  Head: Normocephalic and atraumatic.  Eyes: No scleral icterus.  Neck: Normal range of motion. Neck supple. No thyromegaly present.  Cardiovascular: Normal rate, regular rhythm and normal heart sounds.  Pulmonary/Chest: Effort normal and breath sounds normal. No stridor. No respiratory distress. She has no wheezes. She has no rales.  Occasional cough  Abdominal: Soft. Bowel sounds are normal. There is no tenderness.  Musculoskeletal: She exhibits no edema.  Full aROM of the LLE and LUE.   Lymphadenopathy:    She has cervical adenopathy (large, nontender, left sided lymph node. ).  Neurological: She is alert and oriented to person, place, and time.  Pt asserts there is no altered light touch sensation of the left upper extremity in comparison to the RUE. LLE with altered light touch sensation in comparison to the RLE. Patellar DTR 1+ bilaterally. Strength 5/5 throughout.   Skin: Skin is warm and dry. No rash noted. No erythema. No pallor.  Psychiatric: She has a normal mood and affect. Her behavior is normal. Thought content normal.  Vitals reviewed.    Assessment & Plan:   1. Paresthesia of left arm and leg - CT Head Wo Contrast; Future - CBC with Differential - Comprehensive metabolic panel - Lipid panel - EKG 12-Lead NSR with low voltage - D-dimer, quantitative (not at [facility])  2. Cough - Begin amoxicillin-clavulanate (AUGMENTIN) 875-125 MG tablet; Take 1 tablet by mouth 2 (two) times daily for 10 days.  Dispense: 20 tablet; Refill: 0 - Begin DM-APAP-CPM (CORICIDIN HBP  FLU) 15-500-2 MG TABS; Take 2 tablets by mouth every 6 (six) hours for 5 days.  Dispense: 1 each; Refill: 0  3. Tobacco use disorder - Pt advised to quit smoking to avoid long term consequences such as COPD, cancer, atherosclerosis, MI, stroke, PVD, etc.   *Pt smelled of beer. Asserts last drink was yesterday. Can not quantify how much she drinks to include yesterday's drink/drinks. Maintains she does not drink more than a fifth of liquor (25.6 oz) per day. Pt advised about increase risk of stroke with alcohol consumption.   Meds ordered this encounter  Medications  . aspirin EC 81 MG tablet    Sig: Take 1 tablet (81 mg total) by mouth daily.    Dispense:  90 tablet    Refill:  3    Order Specific Question:   Supervising Provider    Answer:   Hoy Register [[id number]]  . amoxicillin-clavulanate (AUGMENTIN) 875-125 MG tablet    Sig: Take 1 tablet by mouth 2 (two) times daily for 10 days.  Dispense:  20 tablet    Refill:  0    Order Specific Question:   Supervising Provider    Answer:   Hoy Register [[id number]]  . DM-APAP-CPM (CORICIDIN HBP FLU) 15-500-2 MG TABS    Sig: Take 2 tablets by mouth every 6 (six) hours for 5 days.    Dispense:  1 each    Refill:  0    Order Specific Question:   Supervising Provider    Answer:   Hoy Register [[id number]]    Follow-up: Return in about 4 weeks (around 04/25/2018) for Paresthesia of left leg and arm.   Loletta Specter PA

## 2018-03-29 ENCOUNTER — Telehealth (INDEPENDENT_AMBULATORY_CARE_PROVIDER_SITE_OTHER): Payer: Self-pay

## 2018-03-29 LAB — CBC WITH DIFFERENTIAL/PLATELET
Basophils Absolute: 0.1 10*3/uL (ref 0.0–0.2)
Basos: 1 %
EOS (ABSOLUTE): 0.2 10*3/uL (ref 0.0–0.4)
Eos: 2 %
HEMATOCRIT: 35.2 % (ref 34.0–46.6)
Hemoglobin: 12 g/dL (ref 11.1–15.9)
IMMATURE GRANULOCYTES: 0 %
Immature Grans (Abs): 0 10*3/uL (ref 0.0–0.1)
LYMPHS ABS: 1.8 10*3/uL (ref 0.7–3.1)
LYMPHS: 27 %
MCH: 31.7 pg (ref 26.6–33.0)
MCHC: 34.1 g/dL (ref 31.5–35.7)
MCV: 93 fL (ref 79–97)
MONOS ABS: 0.6 10*3/uL (ref 0.1–0.9)
Monocytes: 9 %
NEUTROS PCT: 61 %
Neutrophils Absolute: 4 10*3/uL (ref 1.4–7.0)
Platelets: 287 10*3/uL (ref 150–450)
RBC: 3.78 x10E6/uL (ref 3.77–5.28)
RDW: 15.1 % (ref 12.3–15.4)
WBC: 6.7 10*3/uL (ref 3.4–10.8)

## 2018-03-29 LAB — COMPREHENSIVE METABOLIC PANEL
A/G RATIO: 1.4 (ref 1.2–2.2)
ALT: 13 IU/L (ref 0–32)
AST: 17 IU/L (ref 0–40)
Albumin: 3.8 g/dL (ref 3.5–5.5)
Alkaline Phosphatase: 186 IU/L — ABNORMAL HIGH (ref 39–117)
BUN/Creatinine Ratio: 13 (ref 9–23)
BUN: 9 mg/dL (ref 6–24)
CALCIUM: 8.7 mg/dL (ref 8.7–10.2)
CHLORIDE: 100 mmol/L (ref 96–106)
CO2: 18 mmol/L — ABNORMAL LOW (ref 20–29)
Creatinine, Ser: 0.69 mg/dL (ref 0.57–1.00)
GFR, EST AFRICAN AMERICAN: 113 mL/min/{1.73_m2} (ref >59)
GFR, EST NON AFRICAN AMERICAN: 98 mL/min/{1.73_m2} (ref >59)
GLOBULIN, TOTAL: 2.7 g/dL (ref 1.5–4.5)
Glucose: 105 mg/dL — ABNORMAL HIGH (ref 65–99)
POTASSIUM: 4.5 mmol/L (ref 3.5–5.2)
Sodium: 137 mmol/L (ref 134–144)
Total Protein: 6.5 g/dL (ref 6.0–8.5)

## 2018-03-29 LAB — LIPID PANEL
CHOL/HDL RATIO: 3.9 ratio (ref 0.0–4.4)
Cholesterol, Total: 265 mg/dL — ABNORMAL HIGH (ref 100–199)
HDL: 68 mg/dL (ref >39)
TRIGLYCERIDES: 965 mg/dL — AB (ref 0–149)

## 2018-03-29 LAB — D-DIMER, QUANTITATIVE: D-DIMER: 4.16 mg/L FEU — ABNORMAL HIGH (ref 0.00–0.49)

## 2018-03-29 NOTE — Telephone Encounter (Signed)
Noted  

## 2018-03-29 NOTE — Telephone Encounter (Signed)
-----   Message from Loletta Specter, PA-C sent at 03/29/2018  1:00 PM EDT ----- Unfortunately, triglycerides are highly elevated and d-dimer is showing signs of clot formation. I advise she go to the ED to further evaluate numbness of left arm and leg. She also has to stop drinking alcohol and stop smoking.

## 2018-03-29 NOTE — Telephone Encounter (Signed)
Patient is aware that triglycerides are highly elevated and D dimer shows signs of clot formation. Advised to go to ED to further evaluate numbness of left arm and left leg. Also advised that she stop drinking and smoking. Patient stated she does not have a car and can not guarantee that she could get to ED today. She will try to find a ride. Stated she may have to call and ask her son, advised patient to call son and explain the urgent need for her to go to ED and see if he would take her. Maryjean Morn, CMA

## 2018-03-30 ENCOUNTER — Emergency Department (HOSPITAL_COMMUNITY): Payer: Medicaid Other

## 2018-03-30 ENCOUNTER — Other Ambulatory Visit: Payer: Self-pay

## 2018-03-30 ENCOUNTER — Encounter (HOSPITAL_COMMUNITY): Payer: Self-pay | Admitting: Emergency Medicine

## 2018-03-30 ENCOUNTER — Emergency Department (HOSPITAL_COMMUNITY)
Admission: EM | Admit: 2018-03-30 | Discharge: 2018-03-30 | Disposition: A | Discharge status: Home / Self Care | Payer: Medicaid Other | Attending: Emergency Medicine | Admitting: Emergency Medicine

## 2018-03-30 ENCOUNTER — Emergency Department (HOSPITAL_BASED_OUTPATIENT_CLINIC_OR_DEPARTMENT_OTHER)
Admit: 2018-03-30 | Discharge: 2018-03-30 | Disposition: A | Discharge status: Home / Self Care | Payer: Medicaid Other | Attending: Emergency Medicine | Admitting: Emergency Medicine

## 2018-03-30 DIAGNOSIS — J449 Chronic obstructive pulmonary disease, unspecified: Secondary | ICD-10-CM | POA: Insufficient documentation

## 2018-03-30 DIAGNOSIS — F17210 Nicotine dependence, cigarettes, uncomplicated: Secondary | ICD-10-CM | POA: Insufficient documentation

## 2018-03-30 DIAGNOSIS — R202 Paresthesia of skin: Secondary | ICD-10-CM | POA: Diagnosis not present

## 2018-03-30 DIAGNOSIS — Z79899 Other long term (current) drug therapy: Secondary | ICD-10-CM | POA: Diagnosis not present

## 2018-03-30 DIAGNOSIS — I10 Essential (primary) hypertension: Secondary | ICD-10-CM | POA: Insufficient documentation

## 2018-03-30 DIAGNOSIS — Z7982 Long term (current) use of aspirin: Secondary | ICD-10-CM | POA: Insufficient documentation

## 2018-03-30 DIAGNOSIS — R7989 Other specified abnormal findings of blood chemistry: Secondary | ICD-10-CM | POA: Diagnosis not present

## 2018-03-30 DIAGNOSIS — R42 Dizziness and giddiness: Secondary | ICD-10-CM

## 2018-03-30 NOTE — ED Provider Notes (Signed)
[facility]-EMERGENCY DEPT Provider Note   CSN: [id number] Arrival date & time: 03/30/18  6578     History   Chief Complaint Chief Complaint  Patient presents with  . abnormal labs    HPI Catherine Butler is a 57 y.o. female.  HPI Patient presents after lab abnormalities from PCP.  Reportedly has had some numbness for a while now in her left arm and left leg.  Comes and goes.  May be worse after laying and sitting.  Will come with both arms and legs at the same time.  No chest pain.  No trouble breathing.  Has a history of neuropathy.  No weakness with the arm.  States it feels somewhat like when he is asleep on something.  Had an elevated triglycerides of closed with thousand and also elevated d-dimer.  Has no swelling in her arms or legs.  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  Patient states her PCP was going to get a head CT also but it was not able to be approved by insurance. Past Medical History:  Diagnosis Date  . Anxiety   . Arthritis   . Asthma   . Complication of anesthesia    nausea  . COPD (chronic obstructive pulmonary disease) (HCC)   . Depression   . H/O alcohol abuse   . Hypertension    pt denies  . PONV (postoperative nausea and vomiting)   . Reflux   . Suicide attempt by multiple drug overdose 2014    Patient Active Problem List   Diagnosis Date Noted  . Protein-calorie malnutrition, severe 07/21/2017  . Colonic mass 07/18/2017  . Dehydration 07/18/2017  . Hypotension 07/18/2017  . Hypomagnesemia 07/18/2017  . History of prolonged Q-T interval on ECG 07/06/2017  . Tachycardia   . Generalized anxiety disorder   . Acute lower UTI   . Debility 06/23/2017  . Constipation   . Quadriplegia and quadriparesis (HCC)   . Guillain Barr syndrome (HCC)   . Nausea & vomiting 06/21/2017  . Hypokalemia 06/21/2017  . Volume depletion 06/21/2017  . Sinus tachycardia 06/21/2017  . Nausea and vomiting 06/21/2017  . Essential hypertension 04/14/2017    . Paresthesia   . 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 Abdominal pain 01/01/2017  . Sepsis (HCC) 01/01/2017  . Tobacco abuse 01/01/2017  . Abnormal LFTs 01/01/2017  . Depression   . COPD (chronic obstructive pulmonary disease) (HCC) 06/16/2015    Past Surgical History:  Procedure Laterality Date  . COLONOSCOPY WITH PROPOFOL N/A 07/21/2017   Procedure: COLONOSCOPY WITH PROPOFOL;  Surgeon: Rachael Fee, MD;  Location: [facility];  Service: Endoscopy;  Laterality: N/A;  . LAPAROSCOPY    . laporscopy surgery for ovarian cyst  20 years ago  . TONSILLECTOMY    . TUBAL LIGATION       OB History    Gravida  3   Para  1   Term  1   Preterm      AB  1   Living  1     SAB  1   TAB      Ectopic      Multiple      Live Births               Home Medications    Prior to Admission medications   Medication Sig Start Date End Date Taking? Authorizing Provider  acetaminophen (TYLENOL) 500 MG tablet Take 1,000 mg by mouth 2 (  two) times daily as needed for headache.   Yes [provider]  albuterol (PROVENTIL HFA;VENTOLIN HFA) 108 (90 Base) MCG/ACT inhaler Inhale 2 puffs into the lungs every 6 (six) hours as needed for wheezing. 12/05/17  Yes Loletta Specter, PA-C  amoxicillin-clavulanate (AUGMENTIN) 875-125 MG tablet Take 1 tablet by mouth 2 (two) times daily for 10 days. 03/28/18 04/07/18 Yes Loletta Specter, PA-C  aspirin EC 81 MG tablet Take 1 tablet (81 mg total) by mouth daily. 03/28/18  Yes Loletta Specter, PA-C  DM-APAP-CPM (CORICIDIN HBP FLU) 15-500-2 MG TABS Take 2 tablets by mouth every 6 (six) hours for 5 days. 03/28/18 04/02/18 Yes Loletta Specter, PA-C  DULoxetine (CYMBALTA) 30 MG capsule TAKE 1 CAPSULE BY MOUTH ONCE DAILY Patient taking differently: Take 30 mg by mouth daily after breakfast.  02/08/18  Yes Loletta Specter, PA-C  esomeprazole (NEXIUM) 40 MG packet Take 40 mg by mouth daily before  breakfast. 01/11/18  Yes Rachael Fee, MD  gabapentin (NEURONTIN) 600 MG tablet TAKE 2 TABLETS BY MOUTH 3 TIMES DAILY Patient taking differently: Take 1,200 mg by mouth 3 (three) times daily.  03/17/18  Yes Loletta Specter, PA-C  hydrOXYzine (ATARAX/VISTARIL) 25 MG tablet Take 1 tablet (25 mg total) by mouth at bedtime. 02/28/18  Yes Loletta Specter, PA-C  ibuprofen (ADVIL,MOTRIN) 200 MG tablet Take 400 mg by mouth every 6 (six) hours as needed for fever, headache, mild pain, moderate pain or cramping.   Yes [provider]  linaclotide Karlene Einstein) 290 MCG CAPS capsule Take 1 capsule (290 mcg total) by mouth daily before breakfast. 01/11/18  Yes Rachael Fee, MD  QUEtiapine (SEROQUEL) 400 MG tablet Take 400 mg by mouth at bedtime.   Yes [provider]  nicotine (NICODERM CQ - DOSED IN MG/24 HOURS) 21 mg/24hr patch Place 1 patch (21 mg total) onto the skin daily. 03/28/18   Loletta Specter, PA-C  varenicline (CHANTIX STARTING MONTH PAK) 0.5 MG X 11 & 1 MG X 42 tablet Take one 0.5 mg tablet by mouth once daily for 3 days, then increase to one 0.5 mg tablet twice daily for 4 days, then increase to one 1 mg tablet twice daily. Patient not taking: Reported on 03/28/2018 12/05/17   Loletta Specter, PA-C    Family History Family History  Problem Relation Age of Onset  . Diabetes Father   . Hypertension Father 50       heart attack  . Kidney disease Father   . Heart attack Father   . Heart failure Father   . Dementia Father   . Breast cancer Mother 30       lump removed, bile duct cancer  . COPD Mother   . Hypertension Brother   . Allergic rhinitis Paternal Grandfather     Social History Social History   Tobacco Use  . Smoking status: Current Every Day Smoker    Packs/day: 1.00    Years: 40.00    Pack years: 40.00    Types: Cigarettes  . Smokeless tobacco: Never Used  Substance Use Topics  . Alcohol use: No    Frequency: Never  . Drug use: No      Allergies   Lipitor [atorvastatin]   Review of Systems Review of Systems  Constitutional: Negative for appetite change.  HENT: Negative for congestion.   Respiratory: Negative for cough and shortness of breath.   Cardiovascular: Negative for chest pain.  Gastrointestinal: Negative for abdominal  pain.  Genitourinary: Negative for flank pain.  Musculoskeletal: Negative for back pain.  Skin: Negative for rash.  Neurological: Positive for numbness.  Hematological: Negative for adenopathy.  Psychiatric/Behavioral: Negative for behavioral problems.     Physical Exam Updated Vital Signs BP (!) 152/99   Pulse 87   Temp 97.9 F (36.6 C) (Oral)   Resp 20   Ht 5\' 6"  (1.676 m)   Wt 70.5 kg   LMP 02/22/2006   SpO2 99%   BMI 25.09 kg/m   Physical Exam  Constitutional: She appears well-developed.  HENT:  Head: Atraumatic.  Eyes: EOM are normal.  Neck: Neck supple.  Cardiovascular: Normal rate.  Pulmonary/Chest: Effort normal.  Abdominal: Soft.  Neurological: She is alert.  Sensation and strength grossly intact over bilateral upper and lower extremities.  Face symmetric.  Skin: Skin is warm.  Psychiatric: She has a normal mood and affect.     ED Treatments / Results  Labs (all labs ordered are listed, but only abnormal results are displayed) Labs Reviewed - No data to display  EKG None  Radiology Ct Head Wo Contrast  Result Date: 03/30/2018 CLINICAL DATA:  Left-sided numbness for several weeks EXAM: CT HEAD WITHOUT CONTRAST TECHNIQUE: Contiguous axial images were obtained from the base of the skull through the vertex without intravenous contrast. COMPARISON:  March 16, 2012 FINDINGS: Brain: The ventricles are normal in size and configuration. There is no intracranial mass, hemorrhage, extra-axial fluid collection, or midline shift. Brain parenchyma appears unremarkable. No evident acute infarct. Vascular: No hyperdense vessel.  No evident vascular  calcifications. Skull: The bony calvarium appears intact. Sinuses/Orbits: Visualized paranasal sinuses are clear. Orbits appear symmetric bilaterally. Other: Mastoid air cells are clear. IMPRESSION: Study within normal limits. Electronically Signed   By: Bretta Bang III M.D.   On: 03/30/2018 10:03    Procedures Procedures (including critical care time)  Medications Ordered in ED Medications - No data to display   Initial Impression / Assessment and Plan / ED Course  I have reviewed the triage vital signs and the nursing notes.  Pertinent labs & imaging results that were available during my care of the patient were reviewed by me and considered in my medical decision making (see chart for details).     Patient sent in for elevated d-dimer.  Negative Doppler.  I do not see edema in this leg either.  No signs or symptoms of pulmonary embolism.  Head CT done due to left-sided paresthesias.  Head CT reassuring.  Follow-up as an outpatient with PCP.  Final Clinical Impressions(s) / ED Diagnoses   Final diagnoses:  Paresthesia of left upper and lower extremity    ED Discharge Orders    None       Benjiman Core, MD 03/30/18 1535

## 2018-03-30 NOTE — ED Notes (Signed)
Patient given discharge teaching and verbalized understanding. Patient ambulated out of ED with a steady gait. 

## 2018-03-30 NOTE — ED Triage Notes (Signed)
Pt reports that he doctor called her last night and told her her triglycerides were elevated as well as her D-dimer.

## 2018-03-30 NOTE — Progress Notes (Signed)
LLE venous duplex prelim: negative for DVT. Ammara Raj Eunice, RDMS, RVT  

## 2018-04-05 ENCOUNTER — Telehealth (INDEPENDENT_AMBULATORY_CARE_PROVIDER_SITE_OTHER): Payer: Self-pay

## 2018-04-05 NOTE — Telephone Encounter (Signed)
Patient called stating her cough has gotten worse and she does not feel that the antibiotics are working. Per PCP advised patient to go to urgent care. Maryjean Morn, CMA

## 2018-04-07 NOTE — Telephone Encounter (Signed)
Good documentation.

## 2018-04-10 ENCOUNTER — Emergency Department (HOSPITAL_COMMUNITY): Payer: Medicaid Other

## 2018-04-10 ENCOUNTER — Inpatient Hospital Stay (HOSPITAL_COMMUNITY)
Admission: EM | Admit: 2018-04-10 | Discharge: 2018-04-12 | DRG: 194 | Disposition: A | Discharge status: Home / Self Care | Payer: Medicaid Other | Attending: Internal Medicine | Admitting: Internal Medicine

## 2018-04-10 ENCOUNTER — Encounter (HOSPITAL_COMMUNITY): Payer: Self-pay | Admitting: *Deleted

## 2018-04-10 ENCOUNTER — Other Ambulatory Visit: Payer: Self-pay

## 2018-04-10 DIAGNOSIS — K219 Gastro-esophageal reflux disease without esophagitis: Secondary | ICD-10-CM | POA: Diagnosis present

## 2018-04-10 DIAGNOSIS — J181 Lobar pneumonia, unspecified organism: Principal | ICD-10-CM | POA: Diagnosis present

## 2018-04-10 DIAGNOSIS — I10 Essential (primary) hypertension: Secondary | ICD-10-CM | POA: Diagnosis present

## 2018-04-10 DIAGNOSIS — F17210 Nicotine dependence, cigarettes, uncomplicated: Secondary | ICD-10-CM | POA: Diagnosis present

## 2018-04-10 DIAGNOSIS — Z887 Allergy status to serum and vaccine status: Secondary | ICD-10-CM

## 2018-04-10 DIAGNOSIS — J189 Pneumonia, unspecified organism: Secondary | ICD-10-CM | POA: Diagnosis not present

## 2018-04-10 DIAGNOSIS — F411 Generalized anxiety disorder: Secondary | ICD-10-CM | POA: Diagnosis present

## 2018-04-10 DIAGNOSIS — E860 Dehydration: Secondary | ICD-10-CM | POA: Diagnosis present

## 2018-04-10 DIAGNOSIS — F32A Depression, unspecified: Secondary | ICD-10-CM | POA: Diagnosis present

## 2018-04-10 DIAGNOSIS — R29898 Other symptoms and signs involving the musculoskeletal system: Secondary | ICD-10-CM | POA: Diagnosis present

## 2018-04-10 DIAGNOSIS — J440 Chronic obstructive pulmonary disease with acute lower respiratory infection: Secondary | ICD-10-CM | POA: Diagnosis present

## 2018-04-10 DIAGNOSIS — M1990 Unspecified osteoarthritis, unspecified site: Secondary | ICD-10-CM | POA: Diagnosis present

## 2018-04-10 DIAGNOSIS — Z7982 Long term (current) use of aspirin: Secondary | ICD-10-CM

## 2018-04-10 DIAGNOSIS — R202 Paresthesia of skin: Secondary | ICD-10-CM | POA: Diagnosis present

## 2018-04-10 DIAGNOSIS — Z8249 Family history of ischemic heart disease and other diseases of the circulatory system: Secondary | ICD-10-CM

## 2018-04-10 DIAGNOSIS — J441 Chronic obstructive pulmonary disease with (acute) exacerbation: Secondary | ICD-10-CM

## 2018-04-10 DIAGNOSIS — R000 Tachycardia, unspecified: Secondary | ICD-10-CM | POA: Diagnosis present

## 2018-04-10 DIAGNOSIS — Z79899 Other long term (current) drug therapy: Secondary | ICD-10-CM

## 2018-04-10 DIAGNOSIS — F329 Major depressive disorder, single episode, unspecified: Secondary | ICD-10-CM | POA: Diagnosis present

## 2018-04-10 DIAGNOSIS — Z888 Allergy status to other drugs, medicaments and biological substances status: Secondary | ICD-10-CM

## 2018-04-10 DIAGNOSIS — Z720 Tobacco use: Secondary | ICD-10-CM | POA: Diagnosis present

## 2018-04-10 DIAGNOSIS — J449 Chronic obstructive pulmonary disease, unspecified: Secondary | ICD-10-CM | POA: Diagnosis present

## 2018-04-10 LAB — BASIC METABOLIC PANEL
ANION GAP: 9 (ref 5–15)
BUN: 10 mg/dL (ref 6–20)
CHLORIDE: 106 mmol/L (ref 98–111)
CO2: 20 mmol/L — AB (ref 22–32)
Calcium: 8.1 mg/dL — ABNORMAL LOW (ref 8.9–10.3)
Creatinine, Ser: 0.72 mg/dL (ref 0.44–1.00)
GFR calc non Af Amer: >60 mL/min (ref >60)
Glucose, Bld: 108 mg/dL — ABNORMAL HIGH (ref 70–99)
Potassium: 3.7 mmol/L (ref 3.5–5.1)
Sodium: 135 mmol/L (ref 135–145)

## 2018-04-10 LAB — CBC WITH DIFFERENTIAL/PLATELET
Abs Immature Granulocytes: 0.03 10*3/uL (ref 0.00–0.07)
BASOS ABS: 0.1 10*3/uL (ref 0.0–0.1)
Basophils Relative: 1 %
EOS PCT: 2 %
Eosinophils Absolute: 0.1 10*3/uL (ref 0.0–0.5)
HEMATOCRIT: 34.9 % — AB (ref 36.0–46.0)
HEMOGLOBIN: 11.1 g/dL — AB (ref 12.0–15.0)
Immature Granulocytes: 1 %
LYMPHS ABS: 1.6 10*3/uL (ref 0.7–4.0)
Lymphocytes Relative: 28 %
MCH: 30.7 pg (ref 26.0–34.0)
MCHC: 31.8 g/dL (ref 30.0–36.0)
MCV: 96.7 fL (ref 80.0–100.0)
MONOS PCT: 10 %
Monocytes Absolute: 0.6 10*3/uL (ref 0.1–1.0)
NRBC: 0 % (ref 0.0–0.2)
Neutro Abs: 3.5 10*3/uL (ref 1.7–7.7)
Neutrophils Relative %: 58 %
Platelets: 268 10*3/uL (ref 150–400)
RBC: 3.61 MIL/uL — ABNORMAL LOW (ref 3.87–5.11)
RDW: 17 % — ABNORMAL HIGH (ref 11.5–15.5)
WBC: 5.8 10*3/uL (ref 4.0–10.5)

## 2018-04-10 LAB — BRAIN NATRIURETIC PEPTIDE: B NATRIURETIC PEPTIDE 5: 15.5 pg/mL (ref 0.0–100.0)

## 2018-04-10 LAB — TROPONIN I: Troponin I: <0.03 ng/mL (ref <0.03)

## 2018-04-10 LAB — STREP PNEUMONIAE URINARY ANTIGEN: Strep Pneumo Urinary Antigen: NEGATIVE

## 2018-04-10 MED ORDER — HYDROXYZINE HCL 25 MG PO TABS
25.0000 mg | ORAL_TABLET | Freq: Every day | ORAL | Status: DC
  Administered 2018-04-10 – 2018-04-11 (×2): 25 mg via ORAL
  Filled 2018-04-10 (×2): qty 1

## 2018-04-10 MED ORDER — GUAIFENESIN ER 600 MG PO TB12
600.0000 mg | ORAL_TABLET | Freq: Two times a day (BID) | ORAL | Status: DC
  Administered 2018-04-10 – 2018-04-12 (×4): 600 mg via ORAL
  Filled 2018-04-10 (×4): qty 1

## 2018-04-10 MED ORDER — NICOTINE 21 MG/24HR TD PT24: 21.0000 mg | MEDICATED_PATCH | Freq: Every day | TRANSDERMAL | Status: DC

## 2018-04-10 MED ORDER — LINACLOTIDE 145 MCG PO CAPS
290.0000 ug | ORAL_CAPSULE | Freq: Every day | ORAL | Status: DC
  Administered 2018-04-11 – 2018-04-12 (×2): 290 ug via ORAL
  Filled 2018-04-10 (×2): qty 2

## 2018-04-10 MED ORDER — ONDANSETRON HCL 4 MG PO TABS: 4.0000 mg | ORAL_TABLET | Freq: Four times a day (QID) | ORAL | Status: DC | PRN

## 2018-04-10 MED ORDER — IPRATROPIUM-ALBUTEROL 0.5-2.5 (3) MG/3ML IN SOLN
3.0000 mL | Freq: Three times a day (TID) | RESPIRATORY_TRACT | Status: DC
  Administered 2018-04-11 – 2018-04-12 (×4): 3 mL via RESPIRATORY_TRACT
  Filled 2018-04-10 (×5): qty 3

## 2018-04-10 MED ORDER — ESOMEPRAZOLE MAGNESIUM 40 MG PO CPDR
40.0000 mg | DELAYED_RELEASE_CAPSULE | Freq: Every day | ORAL | Status: DC
  Administered 2018-04-11 – 2018-04-12 (×2): 40 mg via ORAL
  Filled 2018-04-10 (×2): qty 1

## 2018-04-10 MED ORDER — SODIUM CHLORIDE 0.9 % IV SOLN
100.0000 mg | Freq: Once | INTRAVENOUS | Status: AC
  Administered 2018-04-10: 100 mg via INTRAVENOUS
  Filled 2018-04-10: qty 100

## 2018-04-10 MED ORDER — ACETAMINOPHEN 500 MG PO TABS
1000.0000 mg | ORAL_TABLET | Freq: Two times a day (BID) | ORAL | Status: DC | PRN
  Administered 2018-04-10 – 2018-04-11 (×2): 1000 mg via ORAL
  Filled 2018-04-10 (×2): qty 2

## 2018-04-10 MED ORDER — SODIUM CHLORIDE 0.9 % IV SOLN
INTRAVENOUS | Status: DC
  Administered 2018-04-10: 20 mL/h via INTRAVENOUS
  Administered 2018-04-11: 16:00:00 via INTRAVENOUS

## 2018-04-10 MED ORDER — IOPAMIDOL (ISOVUE-370) INJECTION 76%
100.0000 mL | Freq: Once | INTRAVENOUS | Status: AC | PRN
  Administered 2018-04-10: 100 mL via INTRAVENOUS

## 2018-04-10 MED ORDER — ONDANSETRON HCL 4 MG/2ML IJ SOLN
4.0000 mg | Freq: Four times a day (QID) | INTRAMUSCULAR | Status: DC | PRN
  Administered 2018-04-11: 4 mg via INTRAVENOUS
  Filled 2018-04-10: qty 2

## 2018-04-10 MED ORDER — SODIUM CHLORIDE 0.9 % IV SOLN
1.0000 g | INTRAVENOUS | Status: DC
  Administered 2018-04-10 – 2018-04-12 (×3): 1 g via INTRAVENOUS
  Filled 2018-04-10: qty 1
  Filled 2018-04-10 (×2): qty 10

## 2018-04-10 MED ORDER — DULOXETINE HCL 30 MG PO CPEP
30.0000 mg | ORAL_CAPSULE | Freq: Every day | ORAL | Status: DC
  Administered 2018-04-11 – 2018-04-12 (×2): 30 mg via ORAL
  Filled 2018-04-10 (×2): qty 1

## 2018-04-10 MED ORDER — SODIUM CHLORIDE 0.9 % IV SOLN: 500.0000 mg | INTRAVENOUS | Status: DC

## 2018-04-10 MED ORDER — BUDESONIDE 0.25 MG/2ML IN SUSP
0.2500 mg | Freq: Two times a day (BID) | RESPIRATORY_TRACT | Status: DC
  Administered 2018-04-10 – 2018-04-12 (×4): 0.25 mg via RESPIRATORY_TRACT
  Filled 2018-04-10 (×4): qty 2

## 2018-04-10 MED ORDER — QUETIAPINE FUMARATE 100 MG PO TABS
400.0000 mg | ORAL_TABLET | Freq: Every day | ORAL | Status: DC
  Administered 2018-04-10 – 2018-04-11 (×2): 400 mg via ORAL
  Filled 2018-04-10 (×2): qty 4

## 2018-04-10 MED ORDER — GABAPENTIN 400 MG PO CAPS
800.0000 mg | ORAL_CAPSULE | Freq: Three times a day (TID) | ORAL | Status: DC
  Administered 2018-04-10 – 2018-04-12 (×5): 800 mg via ORAL
  Filled 2018-04-10 (×5): qty 2

## 2018-04-10 MED ORDER — METHYLPREDNISOLONE SODIUM SUCC 125 MG IJ SOLR
125.0000 mg | Freq: Once | INTRAMUSCULAR | Status: AC
  Administered 2018-04-10: 125 mg via INTRAVENOUS
  Filled 2018-04-10: qty 2

## 2018-04-10 MED ORDER — PREDNISONE 20 MG PO TABS
40.0000 mg | ORAL_TABLET | Freq: Every day | ORAL | Status: DC
  Administered 2018-04-11 – 2018-04-12 (×2): 40 mg via ORAL
  Filled 2018-04-10 (×2): qty 2

## 2018-04-10 MED ORDER — IBUPROFEN 200 MG PO TABS
400.0000 mg | ORAL_TABLET | Freq: Four times a day (QID) | ORAL | Status: DC | PRN
  Administered 2018-04-10 – 2018-04-12 (×4): 400 mg via ORAL
  Filled 2018-04-10 (×4): qty 2

## 2018-04-10 MED ORDER — IPRATROPIUM-ALBUTEROL 0.5-2.5 (3) MG/3ML IN SOLN: 3.0000 mL | Freq: Four times a day (QID) | RESPIRATORY_TRACT | Status: DC | PRN

## 2018-04-10 MED ORDER — ENOXAPARIN SODIUM 40 MG/0.4ML ~~LOC~~ SOLN
40.0000 mg | SUBCUTANEOUS | Status: DC
  Administered 2018-04-10 – 2018-04-11 (×2): 40 mg via SUBCUTANEOUS
  Filled 2018-04-10 (×2): qty 0.4

## 2018-04-10 MED ORDER — IOPAMIDOL (ISOVUE-370) INJECTION 76%
INTRAVENOUS | Status: AC
  Filled 2018-04-10: qty 100

## 2018-04-10 MED ORDER — ALBUTEROL SULFATE (2.5 MG/3ML) 0.083% IN NEBU
5.0000 mg | INHALATION_SOLUTION | Freq: Once | RESPIRATORY_TRACT | Status: AC
  Administered 2018-04-10: 5 mg via RESPIRATORY_TRACT
  Filled 2018-04-10: qty 6

## 2018-04-10 MED ORDER — SODIUM CHLORIDE 0.9 % IJ SOLN
INTRAMUSCULAR | Status: AC
  Filled 2018-04-10: qty 50

## 2018-04-10 MED ORDER — IPRATROPIUM-ALBUTEROL 0.5-2.5 (3) MG/3ML IN SOLN
3.0000 mL | Freq: Four times a day (QID) | RESPIRATORY_TRACT | Status: DC
  Administered 2018-04-10 (×2): 3 mL via RESPIRATORY_TRACT
  Filled 2018-04-10 (×2): qty 3

## 2018-04-10 MED ORDER — SODIUM CHLORIDE 0.9 % IV SOLN: 1.0000 g | INTRAVENOUS | Status: DC

## 2018-04-10 MED ORDER — ASPIRIN EC 81 MG PO TBEC
81.0000 mg | DELAYED_RELEASE_TABLET | Freq: Every day | ORAL | Status: DC
  Administered 2018-04-11 – 2018-04-12 (×2): 81 mg via ORAL
  Filled 2018-04-10 (×2): qty 1

## 2018-04-10 NOTE — ED Triage Notes (Signed)
Pt presents with c/o SHOB, recent cold symptoms for a month, has taken antibiotics, last few days symptoms increased, productive cough with greenish secretions.

## 2018-04-10 NOTE — ED Notes (Signed)
Returned from CT.

## 2018-04-10 NOTE — ED Notes (Signed)
Pt resting at present, continues to have pain, aware we are waiting for CT results to try and define reason for [facility] and pain,

## 2018-04-10 NOTE — Progress Notes (Signed)
Attempted to call and receive report. Put on hold; no answer.

## 2018-04-10 NOTE — H&P (Signed)
History and Physical    MCKYNZI CAMMON ZOX:[id number] DOB: [birth year] DOA: 04/10/2018  I have briefly reviewed the patient's prior medical records in [facility]  PCP: Loletta Specter, PA-C  Patient coming from: home  Chief Complaint: Shortness of breath  HPI: Catherine Butler is a 57 y.o. female with medical history significant of COPD, ongoing tobacco use, anxiety, presents to the hospital with complaints of shortness of breath.  She tells me that she has been fighting a "cold" for the past month, has been on antibiotics as an outpatient and did not improve.  Over the last 3 days she feels like she has been getting more and more short of breath with minimal activities.  She also has noticed that she has been wheezing more.  She is complaining of productive cough with green sputum.  She denies any chest pain.  She did not have any fever or chills at home.  Complains of abdominal soreness due to coughing but no pain, no nausea or vomiting, no diarrhea.  ED Course: In the emergency room her vitals are stable, she was found to be hypoxic with activity, blood work is unremarkable, underwent a CT angiogram which was negative for PE however it showed left lower lobe pneumonia.  Given persistent dyspnea we are asked to admit.  Review of Systems: As per HPI otherwise 10 point review of systems negative.   Past Medical History:  Diagnosis Date  . Anxiety   . Arthritis   . Asthma   . Complication of anesthesia    nausea  . COPD (chronic obstructive pulmonary disease) (HCC)   . Depression   . H/O alcohol abuse   . Hypertension    pt denies  . PONV (postoperative nausea and vomiting)   . Reflux   . Suicide attempt by multiple drug overdose 2014    Past Surgical History:  Procedure Laterality Date  . COLONOSCOPY WITH PROPOFOL N/A 07/21/2017   Procedure: COLONOSCOPY WITH PROPOFOL;  Surgeon: Rachael Fee, MD;  Location: [facility];  Service: Endoscopy;  Laterality: N/A;  .  LAPAROSCOPY    . laporscopy surgery for ovarian cyst  20 years ago  . TONSILLECTOMY    . TUBAL LIGATION       reports that she has been smoking cigarettes. She has a 40.00 pack-year smoking history. She has never used smokeless tobacco. She reports that she does not drink alcohol or use drugs.  Allergies  Allergen Reactions  . Lipitor [Atorvastatin] Other (See Comments)    "really bad leg pain"    Family History  Problem Relation Age of Onset  . Diabetes Father   . Hypertension Father 50       heart attack  . Kidney disease Father   . Heart attack Father   . Heart failure Father   . Dementia Father   . Breast cancer Mother 30       lump removed, bile duct cancer  . COPD Mother   . Hypertension Brother   . Allergic rhinitis Paternal Grandfather     Prior to Admission medications   Medication Sig Start Date End Date Taking? Authorizing Provider  acetaminophen (TYLENOL) 500 MG tablet Take 1,000 mg by mouth 2 (two) times daily as needed for headache.   Yes [provider]  albuterol (PROVENTIL HFA;VENTOLIN HFA) 108 (90 Base) MCG/ACT inhaler Inhale 2 puffs into the lungs every 6 (six) hours as needed for wheezing. 12/05/17  Yes Loletta Specter, PA-C  aspirin  EC 81 MG tablet Take 1 tablet (81 mg total) by mouth daily. 03/28/18  Yes Loletta Specter, PA-C  DULoxetine (CYMBALTA) 30 MG capsule TAKE 1 CAPSULE BY MOUTH ONCE DAILY Patient taking differently: Take 30 mg by mouth daily after breakfast.  02/08/18  Yes Loletta Specter, PA-C  esomeprazole (NEXIUM) 40 MG packet Take 40 mg by mouth daily before breakfast. 01/11/18  Yes Rachael Fee, MD  gabapentin (NEURONTIN) 600 MG tablet TAKE 2 TABLETS BY MOUTH 3 TIMES DAILY Patient taking differently: Take 1,200 mg by mouth 3 (three) times daily.  03/17/18  Yes Loletta Specter, PA-C  hydrOXYzine (ATARAX/VISTARIL) 25 MG tablet Take 1 tablet (25 mg total) by mouth at bedtime. 02/28/18  Yes Loletta Specter, PA-C  ibuprofen  (ADVIL,MOTRIN) 200 MG tablet Take 400 mg by mouth every 6 (six) hours as needed for fever, headache, mild pain, moderate pain or cramping.   Yes [provider]  linaclotide Karlene Einstein) 290 MCG CAPS capsule Take 1 capsule (290 mcg total) by mouth daily before breakfast. 01/11/18  Yes Rachael Fee, MD  QUEtiapine (SEROQUEL) 400 MG tablet Take 400 mg by mouth at bedtime.   Yes [provider]  nicotine (NICODERM CQ - DOSED IN MG/24 HOURS) 21 mg/24hr patch Place 1 patch (21 mg total) onto the skin daily. Patient not taking: Reported on 04/10/2018 03/28/18   Loletta Specter, PA-C  varenicline (CHANTIX STARTING MONTH PAK) 0.5 MG X 11 & 1 MG X 42 tablet Take one 0.5 mg tablet by mouth once daily for 3 days, then increase to one 0.5 mg tablet twice daily for 4 days, then increase to one 1 mg tablet twice daily. Patient not taking: Reported on 03/28/2018 12/05/17   Loletta Specter, PA-C    Physical Exam: Vitals:   04/10/18 0828 04/10/18 0844 04/10/18 0900 04/10/18 0918  BP: 135/89  (!) 124/91   Pulse: (!) 107  100   Resp: (!) 22  19   Temp: 98.4 F (36.9 C)     TempSrc: Oral     SpO2: 100%  100% 100%  Weight:  70.5 kg    Height:  5\' 6"  (1.676 m)        Constitutional: NAD Eyes: PERRL, lids and conjunctivae normal ENMT: Mucous membranes are moist. Posterior pharynx clear of any exudate or lesions. Neck: normal, supple Respiratory: Overall decreased breath sounds, faint end expiratory wheezing, diminished sounds at the bases, no rales/rhonchi tachypneic Cardiovascular: Regular rate and rhythm, no murmurs / rubs / gallops. No extremity edema. 2+ pedal pulses.  Abdomen: no tenderness, no masses palpated. Bowel sounds positive.  Musculoskeletal: no clubbing / cyanosis. Normal muscle tone.  Skin: no rashes, lesions, ulcers. No induration Neurologic: CN 2-12 grossly intact. Strength 5/5 in all 4.  Psychiatric: Normal judgment and insight. Alert and oriented x 3. Normal mood.    Labs on Admission: I have personally reviewed following labs and imaging studies  CBC: Recent Labs  Lab 04/10/18 0857  WBC 5.8  NEUTROABS 3.5  HGB 11.1*  HCT 34.9*  MCV 96.7  PLT 268   Basic Metabolic Panel: Recent Labs  Lab 04/10/18 0857  NA 135  K 3.7  CL 106  CO2 20*  GLUCOSE 108*  BUN 10  CREATININE 0.72  CALCIUM 8.1*   GFR: Estimated Creatinine Clearance: 74.4 mL/min (by C-G formula based on SCr of 0.72 mg/dL). Liver Function Tests: No results for input(s): AST, ALT, ALKPHOS, BILITOT, PROT, ALBUMIN in the last  168 hours. No results for input(s): LIPASE, AMYLASE in the last 168 hours. No results for input(s): AMMONIA in the last 168 hours. Coagulation Profile: No results for input(s): INR, PROTIME in the last 168 hours. Cardiac Enzymes: Recent Labs  Lab 04/10/18 0857  TROPONINI <0.03   BNP (last 3 results) No results for input(s): PROBNP in the last 8760 hours. HbA1C: No results for input(s): HGBA1C in the last 72 hours. CBG: No results for input(s): GLUCAP in the last 168 hours. Lipid Profile: No results for input(s): CHOL, HDL, LDLCALC, TRIG, CHOLHDL, LDLDIRECT in the last 72 hours. Thyroid Function Tests: No results for input(s): TSH, T4TOTAL, FREET4, T3FREE, THYROIDAB in the last 72 hours. Anemia Panel: No results for input(s): VITAMINB12, FOLATE, FERRITIN, TIBC, IRON, RETICCTPCT in the last 72 hours. Urine analysis:    Component Value Date/Time   COLORURINE YELLOW 08/09/2017 1735   APPEARANCEUR CLEAR 08/09/2017 1735   LABSPEC 1.020 08/09/2017 1735   PHURINE 7.0 08/09/2017 1735   GLUCOSEU NEGATIVE 08/09/2017 1735   HGBUR NEGATIVE 08/09/2017 1735   BILIRUBINUR NEGATIVE 08/09/2017 1735   KETONESUR NEGATIVE 08/09/2017 1735   PROTEINUR NEGATIVE 08/09/2017 1735   UROBILINOGEN 0.2 09/13/2012 0124   NITRITE NEGATIVE 08/09/2017 1735   LEUKOCYTESUR NEGATIVE 08/09/2017 1735     Radiological Exams on Admission: Ct Angio Chest Pe W/cm &/or Wo  Cm  Result Date: 04/10/2018 CLINICAL DATA:  Shortness of breath EXAM: CT ANGIOGRAPHY CHEST WITH CONTRAST TECHNIQUE: Multidetector CT imaging of the chest was performed using the standard protocol during bolus administration of intravenous contrast. Multiplanar CT image reconstructions and MIPs were obtained to evaluate the vascular anatomy. CONTRAST:  100 mL ISOVUE-370 IOPAMIDOL (ISOVUE-370) INJECTION 76% COMPARISON:  Chest CT April 18, 2017; chest radiograph June 20, 2017 FINDINGS: Cardiovascular: There is no demonstrable pulmonary embolus. There is no thoracic aortic aneurysm or dissection. Visualized great vessels appear unremarkable. No pericardial effusion or pericardial thickening is evident. Mediastinum/Nodes: Thyroid appears normal. There is no appreciable thoracic adenopathy. There is a moderate hiatal hernia. Lungs/Pleura: There is focal airspace consolidation in a portion of the anterior segment of the left lower lobe. There are areas of atelectatic change elsewhere in the left lower lobe. There are also areas of patchy atelectasis in each upper lobe. There is scarring in the right apex. There is mild consolidation in the inferior lingula along its posterior aspect. On axial slice 56 series 10, there is a 4 mm nodular opacity in the lateral segment of the right lower lobe, seen on axial slice 56 series 10. There is a 4 mm nodular opacity in the superior segment of the right lower lobe seen on axial slice 57 series 10. No pleural effusion or pleural thickening evident. Upper Abdomen: There is hepatic steatosis. There is diffuse stool in the visualized portions of colon. Visualized upper abdominal structures elsewhere appear unremarkable. Musculoskeletal: There is anterior wedging of the T11 vertebral body, stable. There is anterior wedging of the T8 vertebral body, not present on prior study. There is anterior wedging of the T4 vertebral body, stable from prior study. No chest wall lesions  evident. No blastic or lytic bone lesions evident. Review of the MIP images confirms the above findings. IMPRESSION: 1. No demonstrable pulmonary embolus. No thoracic aortic aneurysm or dissection. 2. Foci of apparent pneumonia in the inferior lingula posteriorly and in the anterior segment of the left lower lobe. Areas of patchy atelectasis noted bilaterally. 4 mm nodular opacities noted in the right lower lobe. No follow-up needed  if patient is low-risk (and has no known or suspected primary neoplasm). Non-contrast chest CT can be considered in 12 months if patient is high-risk. This recommendation follows the consensus statement: Guidelines for Management of Incidental Pulmonary Nodules Detected on CT Images: From the Fleischner Society 2017; Radiology 2017; 284:228-243. 3.  Moderate hiatal hernia. 4.  No appreciable thoracic adenopathy. 5.  Hepatic steatosis. 6. Anterior wedge fracture at T8, not present 1 year prior. Other fractures in the thoracic region appear stable compared to prior study from 1 year prior. Electronically Signed   By: Bretta Bang III M.D.   On: 04/10/2018 10:15    EKG: Independently reviewed.  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 Occasionally uses a walker but can walk on her own most time although quite slow. -PT consult   DVT prophylaxis: Lovenox  Code Status: Full code  Family Communication: no family at bedside Disposition  Plan: home when improved Consults called: none     Admission status: Observation   At the point of initial evaluation, it is my clinical opinion that admission for OBSERVATION is reasonable and necessary because the patient's presenting complaints in the context of their chronic conditions represent sufficient risk of deterioration or significant morbidity to constitute reasonable grounds for close observation in the hospital setting, but that the patient may be medically stable for discharge from the hospital within 24 to 48 hours.    Pamella Pert, MD [facility] Pager [phone]  If 7PM-7AM, please contact night-coverage [email or web address] Password [email or web address]  04/10/2018, 12:13 PM

## 2018-04-10 NOTE — Progress Notes (Signed)
ED TO INPATIENT HANDOFF REPORT  Name/Age/Gender Catherine Butler 57 y.o. female  Code Status Code Status History    Date Active Date Inactive Code Status Order ID Comments User Context   07/18/2017 1937 07/21/2017 1607 Full Code [id number]  Eugenie Filler, MD Inpatient   06/23/2017 1520 07/06/2017 1423 Full Code [id number]  Bary Leriche, PA-C Inpatient   06/21/2017 0124 06/23/2017 1516 Full Code [id number]  Roney Jaffe, MD Inpatient   04/11/2017 1757 04/20/2017 1803 Full Code [id number]  Merton Border, MD ED   01/01/2017 2101 01/03/2017 1825 Full Code [id number]  Ivor Costa, MD ED   06/16/2015 1934 06/18/2015 2038 Full Code [id number]  Doree Albee, MD ED   02/08/2013 1142 02/09/2013 1456 Full Code [id number]  Hyman Bible, PA-C ED   09/13/2012 0222 09/13/2012 1048 Full Code [id number]  Ezequiel Essex, MD ED      Home/SNF/Other Home  Chief Complaint SOB   Level of Care/Admitting Diagnosis ED Disposition    ED Disposition Condition [place]: [facility] [100102]  Level of Care: Med-Surg [16]  Diagnosis: CAP (community acquired pneumonia) [371062]  Admitting Physician: Caren Griffins [phone]  Attending Physician: Caren Griffins [[id number]]  PT Class (Do Not Modify): Observation [104]  PT Acc Code (Do Not Modify): Observation [10022]       Medical History Past Medical History:  Diagnosis Date  . Anxiety   . Arthritis   . Asthma   . Complication of anesthesia    nausea  . COPD (chronic obstructive pulmonary disease) ([place])   . Depression   . H/O alcohol abuse   . Hypertension    pt denies  . PONV (postoperative nausea and vomiting)   . Reflux   . Suicide attempt by multiple drug overdose 2014    Allergies Allergies  Allergen Reactions  . Lipitor [Atorvastatin] Other (See Comments)    "really bad leg pain"    IV Location/Drains/Wounds Patient Lines/Drains/Airways Status   Active Line/Drains/Airways    Name:   Placement  date:   Placement time:   Site:   Days:   Peripheral IV 04/10/18 Left Antecubital   04/10/18    0858    Antecubital   less than 1   AIRWAYS   07/21/17    0701     263   AIRWAYS   07/21/17    0701     263   Wound / Incision (Open or Dehisced) 04/13/17 Puncture Lumbar Mid LP    04/13/17    1639    Lumbar   362          Labs/Imaging Results for orders placed or performed during the hospital encounter of 04/10/18 (from the past 48 hour(s))  CBC with Differential/Platelet     Status: Abnormal   Collection Time: 04/10/18  8:57 AM  Result Value Ref Range   WBC 5.8 4.0 - 10.5 K/uL   RBC 3.61 (L) 3.87 - 5.11 MIL/uL   Hemoglobin 11.1 (L) 12.0 - 15.0 g/dL   HCT 34.9 (L) 36.0 - 46.0 %   MCV 96.7 80.0 - 100.0 fL   MCH 30.7 26.0 - 34.0 pg   MCHC 31.8 30.0 - 36.0 g/dL   RDW 17.0 (H) 11.5 - 15.5 %   Platelets 268 150 - 400 K/uL   nRBC 0.0 0.0 - 0.2 %   Neutrophils Relative % 58 %   Neutro Abs 3.5 1.7 - 7.7 K/uL   Lymphocytes Relative  28 %   Lymphs Abs 1.6 0.7 - 4.0 K/uL   Monocytes Relative 10 %   Monocytes Absolute 0.6 0.1 - 1.0 K/uL   Eosinophils Relative 2 %   Eosinophils Absolute 0.1 0.0 - 0.5 K/uL   Basophils Relative 1 %   Basophils Absolute 0.1 0.0 - 0.1 K/uL   Immature Granulocytes 1 %   Abs Immature Granulocytes 0.03 0.00 - 0.07 K/uL    Comment: Performed at [facility], [place] [address], [place], [place] [address]  Basic metabolic panel     Status: Abnormal   Collection Time: 04/10/18  8:57 AM  Result Value Ref Range   Sodium 135 135 - 145 mmol/L   Potassium 3.7 3.5 - 5.1 mmol/L   Chloride 106 98 - 111 mmol/L   CO2 20 (L) 22 - 32 mmol/L   Glucose, Bld 108 (H) 70 - 99 mg/dL   BUN 10 6 - 20 mg/dL   Creatinine, Ser 0.72 0.44 - 1.00 mg/dL   Calcium 8.1 (L) 8.9 - 10.3 mg/dL   GFR calc non Af Amer >60 >60 mL/min   GFR calc Af Amer >60 >60 mL/min    Comment: (NOTE) The eGFR has been calculated using the CKD EPI equation. This calculation has not been  validated in all clinical situations. eGFR's persistently <60 mL/min signify possible Chronic Kidney Disease.    Anion gap 9 5 - 15    Comment: Performed at [facility], [place] [address], [place], [place] [address]  Troponin I     Status: None   Collection Time: 04/10/18  8:57 AM  Result Value Ref Range   Troponin I <0.03 <0.03 ng/mL    Comment: Performed at [facility], [place] [address] [place], [place] [address]  Brain natriuretic peptide     Status: None   Collection Time: 04/10/18  8:57 AM  Result Value Ref Range   B Natriuretic Peptide 15.5 0.0 - 100.0 pg/mL    Comment: Performed at [facility], [place] [address] [place], [place] [address]   Ct Angio Chest Pe W/cm &/or Wo Cm  Result Date: 04/10/2018 CLINICAL DATA:  Shortness of breath EXAM: CT ANGIOGRAPHY CHEST WITH CONTRAST TECHNIQUE: Multidetector CT imaging of the chest was performed using the standard protocol during bolus administration of intravenous contrast. Multiplanar CT image reconstructions and MIPs were obtained to evaluate the vascular anatomy. CONTRAST:  100 mL ISOVUE-370 IOPAMIDOL (ISOVUE-370) INJECTION 76% COMPARISON:  Chest CT April 18, 2017; chest radiograph June 20, 2017 FINDINGS: Cardiovascular: There is no demonstrable pulmonary embolus. There is no thoracic aortic aneurysm or dissection. Visualized great vessels appear unremarkable. No pericardial effusion or pericardial thickening is evident. Mediastinum/Nodes: Thyroid appears normal. There is no appreciable thoracic adenopathy. There is a moderate hiatal hernia. Lungs/Pleura: There is focal airspace consolidation in a portion of the anterior segment of the left lower lobe. There are areas of atelectatic change elsewhere in the left lower lobe. There are also areas of patchy atelectasis in each upper lobe. There is scarring in the right apex. There is mild consolidation in the inferior lingula along its  posterior aspect. On axial slice 56 series 10, there is a 4 mm nodular opacity in the lateral segment of the right lower lobe, seen on axial slice 56 series 10. There is a 4 mm nodular opacity in the superior segment of the right lower lobe seen on axial slice 57 series 10. No pleural effusion or pleural thickening  evident. Upper Abdomen: There is hepatic steatosis. There is diffuse stool in the visualized portions of colon. Visualized upper abdominal structures elsewhere appear unremarkable. Musculoskeletal: There is anterior wedging of the T11 vertebral body, stable. There is anterior wedging of the T8 vertebral body, not present on prior study. There is anterior wedging of the T4 vertebral body, stable from prior study. No chest wall lesions evident. No blastic or lytic bone lesions evident. Review of the MIP images confirms the above findings. IMPRESSION: 1. No demonstrable pulmonary embolus. No thoracic aortic aneurysm or dissection. 2. Foci of apparent pneumonia in the inferior lingula posteriorly and in the anterior segment of the left lower lobe. Areas of patchy atelectasis noted bilaterally. 4 mm nodular opacities noted in the right lower lobe. No follow-up needed if patient is low-risk (and has no known or suspected primary neoplasm). Non-contrast chest CT can be considered in 12 months if patient is high-risk. This recommendation follows the consensus statement: Guidelines for Management of Incidental Pulmonary Nodules Detected on CT Images: From the Fleischner Society 2017; Radiology 2017; 284:228-243. 3.  Moderate hiatal hernia. 4.  No appreciable thoracic adenopathy. 5.  Hepatic steatosis. 6. Anterior wedge fracture at T8, not present 1 year prior. Other fractures in the thoracic region appear stable compared to prior study from 1 year prior. Electronically Signed   By: Lowella Grip III M.D.   On: 04/10/2018 10:15    Pending Labs [employer] (From admission, onward)    Start     Ordered    Signed and Held  HIV antibody (Routine Testing)  Once,   R     Signed and Held   Signed and Occupational hygienist morning,   R     Signed and Held   Signed and Held  CBC  Tomorrow morning,   R     Signed and Held   Signed and Held  Culture, sputum-assessment  Once,   R     Signed and Held   Signed and Held  Gram stain  Once,   R     Signed and Held   Signed and Held  Strep pneumoniae urinary antigen  Once,   R     Signed and Held          Vitals/Pain Today's Vitals   04/10/18 0844 04/10/18 0900 04/10/18 0918 04/10/18 1235  BP:  (!) 124/91  (!) 130/92  Pulse:  100  (!) 111  Resp:  19  15  Temp:      TempSrc:      SpO2:  100% 100% 98%  Weight: 70.5 kg     Height: '5\' 6"'$  (1.676 m)       Isolation Precautions No active isolations  Medications Medications  0.9 %  sodium chloride infusion ( Intravenous Transfusing/Transfer 04/10/18 1233)  cefTRIAXone (ROCEPHIN) 1 g in sodium chloride 0.9 % 100 mL IVPB (1 g Intravenous Transfusing/Transfer 04/10/18 1233)  doxycycline (VIBRAMYCIN) 100 mg in sodium chloride 0.9 % 250 mL IVPB (100 mg Intravenous Transfusing/Transfer 04/10/18 1234)  methylPREDNISolone sodium succinate (SOLU-MEDROL) 125 mg/2 mL injection 125 mg (125 mg Intravenous Given 04/10/18 0907)  albuterol (PROVENTIL) (2.5 MG/3ML) 0.083% nebulizer solution 5 mg (5 mg Nebulization Given 04/10/18 0918)  iopamidol (ISOVUE-370) 76 % injection 100 mL (100 mLs Intravenous Contrast Given 04/10/18 0956)    Mobility walks

## 2018-04-10 NOTE — ED Notes (Signed)
ED TO INPATIENT HANDOFF REPORT  Name/Age/Gender Catherine Butler 57 y.o. female  Code Status Code Status History    Date Active Date Inactive Code Status Order ID Comments User Context   07/18/2017 1937 07/21/2017 1607 Full Code [id number]  Eugenie Filler, MD Inpatient   06/23/2017 1520 07/06/2017 1423 Full Code [id number]  Bary Leriche, PA-C Inpatient   06/21/2017 0124 06/23/2017 1516 Full Code [id number]  Roney Jaffe, MD Inpatient   04/11/2017 1757 04/20/2017 1803 Full Code [id number]  Merton Border, MD ED   01/01/2017 2101 01/03/2017 1825 Full Code [id number]  Ivor Costa, MD ED   06/16/2015 1934 06/18/2015 2038 Full Code [id number]  Doree Albee, MD ED   02/08/2013 1142 02/09/2013 1456 Full Code [id number]  Hyman Bible, PA-C ED   09/13/2012 0222 09/13/2012 1048 Full Code [id number]  Ezequiel Essex, MD ED      Home/SNF/Other Home  Chief Complaint SOB   Level of Care/Admitting Diagnosis ED Disposition    ED Disposition Condition [place]: [facility] [100102]  Level of Care: Med-Surg [16]  Diagnosis: CAP (community acquired pneumonia) [350093]  Admitting Physician: Caren Griffins [phone]  Attending Physician: Caren Griffins [[id number]]  PT Class (Do Not Modify): Observation [104]  PT Acc Code (Do Not Modify): Observation [10022]       Medical History Past Medical History:  Diagnosis Date  . Anxiety   . Arthritis   . Asthma   . Complication of anesthesia    nausea  . COPD (chronic obstructive pulmonary disease) ([place])   . Depression   . H/O alcohol abuse   . Hypertension    pt denies  . PONV (postoperative nausea and vomiting)   . Reflux   . Suicide attempt by multiple drug overdose 2014    Allergies Allergies  Allergen Reactions  . Lipitor [Atorvastatin] Other (See Comments)    "really bad leg pain"    IV Location/Drains/Wounds Patient Lines/Drains/Airways Status   Active Line/Drains/Airways    Name:   Placement  date:   Placement time:   Site:   Days:   Peripheral IV 04/10/18 Left Antecubital   04/10/18    0858    Antecubital   less than 1   AIRWAYS   07/21/17    0701     263   AIRWAYS   07/21/17    0701     263   Wound / Incision (Open or Dehisced) 04/13/17 Puncture Lumbar Mid LP    04/13/17    1639    Lumbar   362          Labs/Imaging Results for orders placed or performed during the hospital encounter of 04/10/18 (from the past 48 hour(s))  CBC with Differential/Platelet     Status: Abnormal   Collection Time: 04/10/18  8:57 AM  Result Value Ref Range   WBC 5.8 4.0 - 10.5 K/uL   RBC 3.61 (L) 3.87 - 5.11 MIL/uL   Hemoglobin 11.1 (L) 12.0 - 15.0 g/dL   HCT 34.9 (L) 36.0 - 46.0 %   MCV 96.7 80.0 - 100.0 fL   MCH 30.7 26.0 - 34.0 pg   MCHC 31.8 30.0 - 36.0 g/dL   RDW 17.0 (H) 11.5 - 15.5 %   Platelets 268 150 - 400 K/uL   nRBC 0.0 0.0 - 0.2 %   Neutrophils Relative % 58 %   Neutro Abs 3.5 1.7 - 7.7 K/uL   Lymphocytes Relative  28 %   Lymphs Abs 1.6 0.7 - 4.0 K/uL   Monocytes Relative 10 %   Monocytes Absolute 0.6 0.1 - 1.0 K/uL   Eosinophils Relative 2 %   Eosinophils Absolute 0.1 0.0 - 0.5 K/uL   Basophils Relative 1 %   Basophils Absolute 0.1 0.0 - 0.1 K/uL   Immature Granulocytes 1 %   Abs Immature Granulocytes 0.03 0.00 - 0.07 K/uL    Comment: Performed at [facility], [place] [address], [place], [place] [address]  Basic metabolic panel     Status: Abnormal   Collection Time: 04/10/18  8:57 AM  Result Value Ref Range   Sodium 135 135 - 145 mmol/L   Potassium 3.7 3.5 - 5.1 mmol/L   Chloride 106 98 - 111 mmol/L   CO2 20 (L) 22 - 32 mmol/L   Glucose, Bld 108 (H) 70 - 99 mg/dL   BUN 10 6 - 20 mg/dL   Creatinine, Ser 0.72 0.44 - 1.00 mg/dL   Calcium 8.1 (L) 8.9 - 10.3 mg/dL   GFR calc non Af Amer >60 >60 mL/min   GFR calc Af Amer >60 >60 mL/min    Comment: (NOTE) The eGFR has been calculated using the CKD EPI equation. This calculation has not been  validated in all clinical situations. eGFR's persistently <60 mL/min signify possible Chronic Kidney Disease.    Anion gap 9 5 - 15    Comment: Performed at [facility], [place] [address] [place], [place] [address]  Troponin I     Status: None   Collection Time: 04/10/18  8:57 AM  Result Value Ref Range   Troponin I <0.03 <0.03 ng/mL    Comment: Performed at [facility], [place] [address], [place], [place] [address]  Brain natriuretic peptide     Status: None   Collection Time: 04/10/18  8:57 AM  Result Value Ref Range   B Natriuretic Peptide 15.5 0.0 - 100.0 pg/mL    Comment: Performed at [facility], [place] [address] [place], [place] [address]   Ct Angio Chest Pe W/cm &/or Wo Cm  Result Date: 04/10/2018 CLINICAL DATA:  Shortness of breath EXAM: CT ANGIOGRAPHY CHEST WITH CONTRAST TECHNIQUE: Multidetector CT imaging of the chest was performed using the standard protocol during bolus administration of intravenous contrast. Multiplanar CT image reconstructions and MIPs were obtained to evaluate the vascular anatomy. CONTRAST:  100 mL ISOVUE-370 IOPAMIDOL (ISOVUE-370) INJECTION 76% COMPARISON:  Chest CT April 18, 2017; chest radiograph June 20, 2017 FINDINGS: Cardiovascular: There is no demonstrable pulmonary embolus. There is no thoracic aortic aneurysm or dissection. Visualized great vessels appear unremarkable. No pericardial effusion or pericardial thickening is evident. Mediastinum/Nodes: Thyroid appears normal. There is no appreciable thoracic adenopathy. There is a moderate hiatal hernia. Lungs/Pleura: There is focal airspace consolidation in a portion of the anterior segment of the left lower lobe. There are areas of atelectatic change elsewhere in the left lower lobe. There are also areas of patchy atelectasis in each upper lobe. There is scarring in the right apex. There is mild consolidation in the inferior lingula along its  posterior aspect. On axial slice 56 series 10, there is a 4 mm nodular opacity in the lateral segment of the right lower lobe, seen on axial slice 56 series 10. There is a 4 mm nodular opacity in the superior segment of the right lower lobe seen on axial slice 57 series 10. No pleural effusion or pleural thickening  evident. Upper Abdomen: There is hepatic steatosis. There is diffuse stool in the visualized portions of colon. Visualized upper abdominal structures elsewhere appear unremarkable. Musculoskeletal: There is anterior wedging of the T11 vertebral body, stable. There is anterior wedging of the T8 vertebral body, not present on prior study. There is anterior wedging of the T4 vertebral body, stable from prior study. No chest wall lesions evident. No blastic or lytic bone lesions evident. Review of the MIP images confirms the above findings. IMPRESSION: 1. No demonstrable pulmonary embolus. No thoracic aortic aneurysm or dissection. 2. Foci of apparent pneumonia in the inferior lingula posteriorly and in the anterior segment of the left lower lobe. Areas of patchy atelectasis noted bilaterally. 4 mm nodular opacities noted in the right lower lobe. No follow-up needed if patient is low-risk (and has no known or suspected primary neoplasm). Non-contrast chest CT can be considered in 12 months if patient is high-risk. This recommendation follows the consensus statement: Guidelines for Management of Incidental Pulmonary Nodules Detected on CT Images: From the Fleischner Society 2017; Radiology 2017; 284:228-243. 3.  Moderate hiatal hernia. 4.  No appreciable thoracic adenopathy. 5.  Hepatic steatosis. 6. Anterior wedge fracture at T8, not present 1 year prior. Other fractures in the thoracic region appear stable compared to prior study from 1 year prior. Electronically Signed   By: Lowella Grip III M.D.   On: 04/10/2018 10:15    Pending Labs [employer] (From admission, onward)    Start     Ordered    Signed and Held  HIV antibody (Routine Testing)  Once,   R     Signed and Held   Signed and Occupational hygienist morning,   R     Signed and Held   Signed and Held  CBC  Tomorrow morning,   R     Signed and Held   Signed and Held  Culture, sputum-assessment  Once,   R     Signed and Held   Signed and Held  Gram stain  Once,   R     Signed and Held   Signed and Held  Strep pneumoniae urinary antigen  Once,   R     Signed and Held          Vitals/Pain Today's Vitals   04/10/18 0828 04/10/18 0844 04/10/18 0900 04/10/18 0918  BP: 135/89  (!) 124/91   Pulse: (!) 107  100   Resp: (!) 22  19   Temp: 98.4 F (36.9 C)     TempSrc: Oral     SpO2: 100%  100% 100%  Weight:  70.5 kg    Height:  '5\' 6"'$  (1.676 m)      Isolation Precautions No active isolations  Medications Medications  0.9 %  sodium chloride infusion ( Intravenous Transfusing/Transfer 04/10/18 1233)  cefTRIAXone (ROCEPHIN) 1 g in sodium chloride 0.9 % 100 mL IVPB (1 g Intravenous Transfusing/Transfer 04/10/18 1233)  doxycycline (VIBRAMYCIN) 100 mg in sodium chloride 0.9 % 250 mL IVPB (100 mg Intravenous Transfusing/Transfer 04/10/18 1234)  methylPREDNISolone sodium succinate (SOLU-MEDROL) 125 mg/2 mL injection 125 mg (125 mg Intravenous Given 04/10/18 0907)  albuterol (PROVENTIL) (2.5 MG/3ML) 0.083% nebulizer solution 5 mg (5 mg Nebulization Given 04/10/18 0918)  iopamidol (ISOVUE-370) 76 % injection 100 mL (100 mLs Intravenous Contrast Given 04/10/18 0956)    Mobility walks

## 2018-04-10 NOTE — ED Provider Notes (Signed)
[facility]-EMERGENCY DEPT Provider Note   CSN: [id number] Arrival date & time: 04/10/18  0815     History   Chief Complaint Chief Complaint  Patient presents with  . Shortness of Breath    HPI Catherine Butler is a 57 y.o. female.  57 year old female presents with one-month history of cough and shortness of breath worse over the past 3 days.  Cough is been productive of green secretions.  Seen here 11 days ago and had elevated d-dimer and had noted leg discomfort and had negative Dopplers.  She also has a history of COPD has been using her inhaler but does continue to use tobacco products.  Came in today because of worsening dyspnea on exertion.  Denies any anginal type chest pain.     Past Medical History:  Diagnosis Date  . Anxiety   . Arthritis   . Asthma   . Complication of anesthesia    nausea  . COPD (chronic obstructive pulmonary disease) (HCC)   . Depression   . H/O alcohol abuse   . Hypertension    pt denies  . PONV (postoperative nausea and vomiting)   . Reflux   . Suicide attempt by multiple drug overdose 2014    Patient Active Problem List   Diagnosis Date Noted  . Protein-calorie malnutrition, severe 07/21/2017  . Colonic mass 07/18/2017  . Dehydration 07/18/2017  . Hypotension 07/18/2017  . Hypomagnesemia 07/18/2017  . History of prolonged Q-T interval on ECG 07/06/2017  . Tachycardia   . Generalized anxiety disorder   . Acute lower UTI   . Debility 06/23/2017  . Constipation   . Quadriplegia and quadriparesis (HCC)   . Guillain Barr syndrome (HCC)   . Nausea & vomiting 06/21/2017  . Hypokalemia 06/21/2017  . Volume depletion 06/21/2017  . Sinus tachycardia 06/21/2017  . Nausea and vomiting 06/21/2017  . Essential hypertension 04/14/2017  . Paresthesia   . 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 Abdominal pain 01/01/2017  . Sepsis (HCC) 01/01/2017  . Tobacco abuse 01/01/2017  .  Abnormal LFTs 01/01/2017  . Depression   . COPD (chronic obstructive pulmonary disease) (HCC) 06/16/2015    Past Surgical History:  Procedure Laterality Date  . COLONOSCOPY WITH PROPOFOL N/A 07/21/2017   Procedure: COLONOSCOPY WITH PROPOFOL;  Surgeon: Rachael Fee, MD;  Location: [facility];  Service: Endoscopy;  Laterality: N/A;  . LAPAROSCOPY    . laporscopy surgery for ovarian cyst  20 years ago  . TONSILLECTOMY    . TUBAL LIGATION       OB History    Gravida  3   Para  1   Term  1   Preterm      AB  1   Living  1     SAB  1   TAB      Ectopic      Multiple      Live Births               Home Medications    Prior to Admission medications   Medication Sig Start Date End Date Taking? Authorizing Provider  acetaminophen (TYLENOL) 500 MG tablet Take 1,000 mg by mouth 2 (two) times daily as needed for headache.    [provider]  albuterol (PROVENTIL HFA;VENTOLIN HFA) 108 (90 Base) MCG/ACT inhaler Inhale 2 puffs into the lungs every 6 (six) hours as needed for wheezing. 12/05/17   Loletta Specter, PA-C  aspirin EC 81 MG tablet Take 1 tablet (81 mg total) by mouth daily. 03/28/18   Loletta Specter, PA-C  DULoxetine (CYMBALTA) 30 MG capsule TAKE 1 CAPSULE BY MOUTH ONCE DAILY Patient taking differently: Take 30 mg by mouth daily after breakfast.  02/08/18   Loletta Specter, PA-C  esomeprazole (NEXIUM) 40 MG packet Take 40 mg by mouth daily before breakfast. 01/11/18   Rachael Fee, MD  gabapentin (NEURONTIN) 600 MG tablet TAKE 2 TABLETS BY MOUTH 3 TIMES DAILY Patient taking differently: Take 1,200 mg by mouth 3 (three) times daily.  03/17/18   Loletta Specter, PA-C  hydrOXYzine (ATARAX/VISTARIL) 25 MG tablet Take 1 tablet (25 mg total) by mouth at bedtime. 02/28/18   Loletta Specter, PA-C  ibuprofen (ADVIL,MOTRIN) 200 MG tablet Take 400 mg by mouth every 6 (six) hours as needed for fever, headache, mild pain, moderate pain or cramping.     [provider]  linaclotide Karlene Einstein) 290 MCG CAPS capsule Take 1 capsule (290 mcg total) by mouth daily before breakfast. 01/11/18   Rachael Fee, MD  nicotine (NICODERM CQ - DOSED IN MG/24 HOURS) 21 mg/24hr patch Place 1 patch (21 mg total) onto the skin daily. 03/28/18   Loletta Specter, PA-C  QUEtiapine (SEROQUEL) 400 MG tablet Take 400 mg by mouth at bedtime.    [provider]  varenicline (CHANTIX STARTING MONTH PAK) 0.5 MG X 11 & 1 MG X 42 tablet Take one 0.5 mg tablet by mouth once daily for 3 days, then increase to one 0.5 mg tablet twice daily for 4 days, then increase to one 1 mg tablet twice daily. Patient not taking: Reported on 03/28/2018 12/05/17   Loletta Specter, PA-C    Family History Family History  Problem Relation Age of Onset  . Diabetes Father   . Hypertension Father 50       heart attack  . Kidney disease Father   . Heart attack Father   . Heart failure Father   . Dementia Father   . Breast cancer Mother 30       lump removed, bile duct cancer  . COPD Mother   . Hypertension Brother   . Allergic rhinitis Paternal Grandfather     Social History Social History   Tobacco Use  . Smoking status: Current Every Day Smoker    Packs/day: 1.00    Years: 40.00    Pack years: 40.00    Types: Cigarettes  . Smokeless tobacco: Never Used  Substance Use Topics  . Alcohol use: No    Frequency: Never  . Drug use: No     Allergies   Lipitor [atorvastatin]   Review of Systems Review of Systems  All other systems reviewed and are negative.    Physical Exam Updated Vital Signs BP 135/89 (BP Location: Left Arm)   Pulse (!) 107   Temp 98.4 F (36.9 C) (Oral)   Resp (!) 22   Ht 1.676 m (5\' 6" )   Wt 70.5 kg   LMP 02/22/2006   SpO2 100%   BMI 25.09 kg/m   Physical Exam  Constitutional: She is oriented to person, place, and time. She appears well-developed and well-nourished.  Non-toxic appearance. No distress.  HENT:  Head:  Normocephalic and atraumatic.  Eyes: Pupils are equal, round, and reactive to light. Conjunctivae, EOM and lids are normal.  Neck: Normal range of motion. Neck supple. No tracheal deviation present. No thyroid mass present.  Cardiovascular:  Regular rhythm and normal heart sounds. Tachycardia present. Exam reveals no gallop.  No murmur heard. Pulmonary/Chest: Effort normal. No stridor. No respiratory distress. She has decreased breath sounds in the right lower field and the left lower field. She has wheezes in the right lower field and the left lower field. She has no rhonchi. She has no rales.  Abdominal: Soft. Normal appearance and bowel sounds are normal. She exhibits no distension. There is no tenderness. There is no rebound and no CVA tenderness.  Musculoskeletal: Normal range of motion. She exhibits no edema or tenderness.  Neurological: She is alert and oriented to person, place, and time. She has normal strength. No cranial nerve deficit or sensory deficit. GCS eye subscore is 4. GCS verbal subscore is 5. GCS motor subscore is 6.  Skin: Skin is warm and dry. No abrasion and no rash noted.  Psychiatric: She has a normal mood and affect. Her speech is normal and behavior is normal.  Nursing note and vitals reviewed.    ED Treatments / Results  Labs (all labs ordered are listed, but only abnormal results are displayed) Labs Reviewed  CBC WITH DIFFERENTIAL/PLATELET  BASIC METABOLIC PANEL  TROPONIN I  BRAIN NATRIURETIC PEPTIDE    EKG EKG Interpretation  Date/Time:  Monday April 10 2018 08:26:41 EDT Ventricular Rate:  108 PR Interval:    QRS Duration: 85 QT Interval:  347 QTC Calculation: 466 R Axis:   37 Text Interpretation:  Sinus tachycardia Low voltage, extremity leads Baseline wander in lead(s) V2 Confirmed by Lorre Nick ([id number]) on 04/10/2018 8:49:40 AM   Radiology No results found.  Procedures Procedures (including critical care time)  Medications Ordered in  ED Medications  0.9 %  sodium chloride infusion (has no administration in time range)  methylPREDNISolone sodium succinate (SOLU-MEDROL) 125 mg/2 mL injection 125 mg (has no administration in time range)  albuterol (PROVENTIL) (2.5 MG/3ML) 0.083% nebulizer solution 5 mg (has no administration in time range)     Initial Impression / Assessment and Plan / ED Course  I have reviewed the triage vital signs and the nursing notes.  Pertinent labs & imaging results that were available during my care of the patient were reviewed by me and considered in my medical decision making (see chart for details).     Patient is tachypneic and short of breath.  Patient also had wheezing as well and given Solu-Medrol as well as beta agonist.  Some improvement of her symptoms.  Chest CT was performed for possibility of PE but showed that patient has pneumonia.  Start on IV antibiotics and will require admission.  CRITICAL CARE Performed by: Toy Baker Total critical care time: 45 minutes Critical care time was exclusive of separately billable procedures and treating other patients. Critical care was necessary to treat or prevent imminent or life-threatening deterioration. Critical care was time spent personally by me on the following activities: development of treatment plan with patient and/or surrogate as well as nursing, discussions with consultants, evaluation of patient's response to treatment, examination of patient, obtaining history from patient or surrogate, ordering and performing treatments and interventions, ordering and review of laboratory studies, ordering and review of radiographic studies, pulse oximetry and re-evaluation of patient's condition.   Final Clinical Impressions(s) / ED Diagnoses   Final diagnoses:  None    ED Discharge Orders    None       Lorre Nick, MD 04/10/18 1142

## 2018-04-11 DIAGNOSIS — F411 Generalized anxiety disorder: Secondary | ICD-10-CM | POA: Diagnosis present

## 2018-04-11 DIAGNOSIS — J440 Chronic obstructive pulmonary disease with acute lower respiratory infection: Secondary | ICD-10-CM | POA: Diagnosis present

## 2018-04-11 DIAGNOSIS — Z720 Tobacco use: Secondary | ICD-10-CM

## 2018-04-11 DIAGNOSIS — Z7982 Long term (current) use of aspirin: Secondary | ICD-10-CM | POA: Diagnosis not present

## 2018-04-11 DIAGNOSIS — F329 Major depressive disorder, single episode, unspecified: Secondary | ICD-10-CM

## 2018-04-11 DIAGNOSIS — Z79899 Other long term (current) drug therapy: Secondary | ICD-10-CM | POA: Diagnosis not present

## 2018-04-11 DIAGNOSIS — J181 Lobar pneumonia, unspecified organism: Principal | ICD-10-CM

## 2018-04-11 DIAGNOSIS — R202 Paresthesia of skin: Secondary | ICD-10-CM | POA: Diagnosis present

## 2018-04-11 DIAGNOSIS — F17210 Nicotine dependence, cigarettes, uncomplicated: Secondary | ICD-10-CM | POA: Diagnosis present

## 2018-04-11 DIAGNOSIS — J42 Unspecified chronic bronchitis: Secondary | ICD-10-CM

## 2018-04-11 DIAGNOSIS — K219 Gastro-esophageal reflux disease without esophagitis: Secondary | ICD-10-CM | POA: Diagnosis present

## 2018-04-11 DIAGNOSIS — E860 Dehydration: Secondary | ICD-10-CM | POA: Diagnosis present

## 2018-04-11 DIAGNOSIS — J189 Pneumonia, unspecified organism: Secondary | ICD-10-CM | POA: Diagnosis present

## 2018-04-11 DIAGNOSIS — R29898 Other symptoms and signs involving the musculoskeletal system: Secondary | ICD-10-CM

## 2018-04-11 DIAGNOSIS — M1990 Unspecified osteoarthritis, unspecified site: Secondary | ICD-10-CM | POA: Diagnosis present

## 2018-04-11 DIAGNOSIS — Z888 Allergy status to other drugs, medicaments and biological substances status: Secondary | ICD-10-CM | POA: Diagnosis not present

## 2018-04-11 DIAGNOSIS — Z887 Allergy status to serum and vaccine status: Secondary | ICD-10-CM | POA: Diagnosis not present

## 2018-04-11 DIAGNOSIS — R000 Tachycardia, unspecified: Secondary | ICD-10-CM | POA: Diagnosis present

## 2018-04-11 DIAGNOSIS — I10 Essential (primary) hypertension: Secondary | ICD-10-CM | POA: Diagnosis present

## 2018-04-11 DIAGNOSIS — Z8249 Family history of ischemic heart disease and other diseases of the circulatory system: Secondary | ICD-10-CM | POA: Diagnosis not present

## 2018-04-11 LAB — BASIC METABOLIC PANEL
Anion gap: 5 (ref 5–15)
BUN: 10 mg/dL (ref 6–20)
CO2: 22 mmol/L (ref 22–32)
CREATININE: 0.62 mg/dL (ref 0.44–1.00)
Calcium: 8.3 mg/dL — ABNORMAL LOW (ref 8.9–10.3)
Chloride: 112 mmol/L — ABNORMAL HIGH (ref 98–111)
GFR calc non Af Amer: >60 mL/min (ref >60)
Glucose, Bld: 123 mg/dL — ABNORMAL HIGH (ref 70–99)
Potassium: 3.3 mmol/L — ABNORMAL LOW (ref 3.5–5.1)
SODIUM: 139 mmol/L (ref 135–145)

## 2018-04-11 LAB — CBC
HCT: 28.4 % — ABNORMAL LOW (ref 36.0–46.0)
Hemoglobin: 9.1 g/dL — ABNORMAL LOW (ref 12.0–15.0)
MCH: 31.3 pg (ref 26.0–34.0)
MCHC: 32 g/dL (ref 30.0–36.0)
MCV: 97.6 fL (ref 80.0–100.0)
NRBC: 0 % (ref 0.0–0.2)
PLATELETS: 215 10*3/uL (ref 150–400)
RBC: 2.91 MIL/uL — ABNORMAL LOW (ref 3.87–5.11)
RDW: 17 % — AB (ref 11.5–15.5)
WBC: 6.8 10*3/uL (ref 4.0–10.5)

## 2018-04-11 LAB — EXPECTORATED SPUTUM ASSESSMENT W GRAM STAIN, RFLX TO RESP C

## 2018-04-11 LAB — TSH: TSH: 0.69 u[IU]/mL (ref 0.350–4.500)

## 2018-04-11 LAB — HIV ANTIBODY (ROUTINE TESTING W REFLEX): HIV Screen 4th Generation wRfx: NONREACTIVE

## 2018-04-11 LAB — EXPECTORATED SPUTUM ASSESSMENT W REFEX TO RESP CULTURE

## 2018-04-11 MED ORDER — POTASSIUM CHLORIDE 20 MEQ PO PACK
40.0000 meq | PACK | Freq: Once | ORAL | Status: AC
  Administered 2018-04-11: 40 meq via ORAL
  Filled 2018-04-11: qty 2

## 2018-04-11 MED ORDER — TRAMADOL HCL 50 MG PO TABS
50.0000 mg | ORAL_TABLET | Freq: Four times a day (QID) | ORAL | Status: DC | PRN
  Administered 2018-04-11 – 2018-04-12 (×3): 50 mg via ORAL
  Filled 2018-04-11 (×3): qty 1

## 2018-04-11 NOTE — Progress Notes (Addendum)
PROGRESS NOTE  THEODORE RAHRIG JXB:[id number] DOB: 16-Jul-1960 DOA: 04/10/2018 PCP: Loletta Specter, PA-C  HPI/Brief Narrative  DEEPTI GUNAWAN is a 57 y.o. year old female with medical history significant for COPD, tobacco use, anxiety,  who presented on 04/10/2018 with progressive dyspnea for several weeks that worsened over the last week with cough slightly productive of green sputum.  She was given augmentin on 10/8 and increased use of her home inhalers with little improvements. CTA chest on admission consistent with lobar pneumonia.  Subjective Feels ok. Still coughing. No fever. No chest pain  Assessment/Plan:  # Left lobar Pneumonia, improving. Confirmed on CT imaging which ruled out PE. S pneumo neg. Not requiring oxygen and non-productive cough, remains afebrile. Anticipate deescalation from IV doxycycline and ceftriaxone to oral regimen pending sputum culture. Oxygen ambulatory test. Scheduled mucinex  #Copd exacerbation, mild. Concurrent with infection from above. No active wheezing doing well on scheduled duonebs. Continue oral prednisone, flutter valve and incentive spirometry. Antibiotics as above  #Sinus Tachycardia, unclear etiology. HR in 110s. Confirmed on EKG. Check TSH. Maybe related to dehydration? Start timed IVF over next 24 hours.    #Anxiety disorder. Home cymbalta  #Tobacco abuse. Counseled for cessation on admission.   #GERD, stable. Continue nexium  # History of GBS. Occasionally uses walker for ambulation. PT and OT to evaluate.   #Paresthesia, chronic. Home gabapentin  Code Status: FULL code   Family Communication: no family at bedside   Disposition Plan: change to oral antibiotics    Consultants:  none     Procedures:  none   Antimicrobials: Anti-infectives (From admission, onward)   Start     Dose/Rate Route Frequency Ordered Stop   04/11/18 0600  cefTRIAXone (ROCEPHIN) 1 g in sodium chloride 0.9 % 100 mL IVPB  Status:  Discontinued       1 g 200 mL/hr over 30 Minutes Intravenous Every 24 hours 04/10/18 1247 04/10/18 1259   04/11/18 0600  azithromycin (ZITHROMAX) 500 mg in sodium chloride 0.9 % 250 mL IVPB  Status:  Discontinued     500 mg 250 mL/hr over 60 Minutes Intravenous Every 24 hours 04/10/18 1247 04/10/18 1259   04/10/18 1115  doxycycline (VIBRAMYCIN) 100 mg in sodium chloride 0.9 % 250 mL IVPB     100 mg 125 mL/hr over 120 Minutes Intravenous  Once 04/10/18 1058 04/10/18 1348   04/10/18 1100  cefTRIAXone (ROCEPHIN) 1 g in sodium chloride 0.9 % 100 mL IVPB     1 g 200 mL/hr over 30 Minutes Intravenous Every 24 hours 04/10/18 1055           Cultures:  Strep pneumo antigen negative  Telemetry:no  DVT prophylaxis:  lovenox   Objective: Vitals:   04/10/18 1951 04/10/18 1955 04/11/18 0433 04/11/18 0710  BP:  (!) 143/94 (!) 143/94   Pulse:  (!) 113 (!) 114 (!) 113  Resp:  (!) 23 20 18   Temp:  98.1 F (36.7 C) (!) 97.4 F (36.3 C)   TempSrc:  Oral Oral   SpO2: 99% 97% 99% 98%  Weight:      Height:        Intake/Output Summary (Last 24 hours) at 04/11/2018 0942 Last data filed at 04/11/2018 0544 Gross per 24 hour  Intake 1457.17 ml  Output 0 ml  Net 1457.17 ml   Filed Weights   04/10/18 0844  Weight: 70.5 kg    Exam:  Constitutional:normal appearing female, no distress Eyes: EOMI, anicteric, normal  conjunctivae ENMT: Oropharynx with moist mucous membranes,  Cardiovascular:tachycardic, no MRGs, with no peripheral edema Respiratory: Normal respiratory effort on room air, no wheezing  Abdomen: Soft,non-tender,  Skin: No rash ulcers, or lesions. Without skin tenting  Neurologic: Grossly no focal neuro deficit. Psychiatric:Appropriate affect, and mood. Mental status AAOx3  Data Reviewed: CBC: Recent Labs  Lab 04/10/18 0857 04/11/18 0554  WBC 5.8 6.8  NEUTROABS 3.5  --   HGB 11.1* 9.1*  HCT 34.9* 28.4*  MCV 96.7 97.6  PLT 268 215   Basic Metabolic Panel: Recent Labs  Lab  04/10/18 0857 04/11/18 0554  NA 135 139  K 3.7 3.3*  CL 106 112*  CO2 20* 22  GLUCOSE 108* 123*  BUN 10 10  CREATININE 0.72 0.62  CALCIUM 8.1* 8.3*   GFR: Estimated Creatinine Clearance: 74.4 mL/min (by C-G formula based on SCr of 0.62 mg/dL). Liver Function Tests: No results for input(s): AST, ALT, ALKPHOS, BILITOT, PROT, ALBUMIN in the last 168 hours. No results for input(s): LIPASE, AMYLASE in the last 168 hours. No results for input(s): AMMONIA in the last 168 hours. Coagulation Profile: No results for input(s): INR, PROTIME in the last 168 hours. Cardiac Enzymes: Recent Labs  Lab 04/10/18 0857  TROPONINI <0.03   BNP (last 3 results) No results for input(s): PROBNP in the last 8760 hours. HbA1C: No results for input(s): HGBA1C in the last 72 hours. CBG: No results for input(s): GLUCAP in the last 168 hours. Lipid Profile: No results for input(s): CHOL, HDL, LDLCALC, TRIG, CHOLHDL, LDLDIRECT in the last 72 hours. Thyroid Function Tests: No results for input(s): TSH, T4TOTAL, FREET4, T3FREE, THYROIDAB in the last 72 hours. Anemia Panel: No results for input(s): VITAMINB12, FOLATE, FERRITIN, TIBC, IRON, RETICCTPCT in the last 72 hours. Urine analysis:    Component Value Date/Time   COLORURINE YELLOW 08/09/2017 1735   APPEARANCEUR CLEAR 08/09/2017 1735   LABSPEC 1.020 08/09/2017 1735   PHURINE 7.0 08/09/2017 1735   GLUCOSEU NEGATIVE 08/09/2017 1735   HGBUR NEGATIVE 08/09/2017 1735   BILIRUBINUR NEGATIVE 08/09/2017 1735   KETONESUR NEGATIVE 08/09/2017 1735   PROTEINUR NEGATIVE 08/09/2017 1735   UROBILINOGEN 0.2 09/13/2012 0124   NITRITE NEGATIVE 08/09/2017 1735   LEUKOCYTESUR NEGATIVE 08/09/2017 1735   Sepsis Labs: @LABRCNTIP (procalcitonin:4,lacticidven:4)  )No results found for this or any previous visit (from the past 240 hour(s)).    Studies: Ct Angio Chest Pe W/cm &/or Wo Cm  Result Date: 04/10/2018 CLINICAL DATA:  Shortness of breath EXAM: CT  ANGIOGRAPHY CHEST WITH CONTRAST TECHNIQUE: Multidetector CT imaging of the chest was performed using the standard protocol during bolus administration of intravenous contrast. Multiplanar CT image reconstructions and MIPs were obtained to evaluate the vascular anatomy. CONTRAST:  100 mL ISOVUE-370 IOPAMIDOL (ISOVUE-370) INJECTION 76% COMPARISON:  Chest CT April 18, 2017; chest radiograph June 20, 2017 FINDINGS: Cardiovascular: There is no demonstrable pulmonary embolus. There is no thoracic aortic aneurysm or dissection. Visualized great vessels appear unremarkable. No pericardial effusion or pericardial thickening is evident. Mediastinum/Nodes: Thyroid appears normal. There is no appreciable thoracic adenopathy. There is a moderate hiatal hernia. Lungs/Pleura: There is focal airspace consolidation in a portion of the anterior segment of the left lower lobe. There are areas of atelectatic change elsewhere in the left lower lobe. There are also areas of patchy atelectasis in each upper lobe. There is scarring in the right apex. There is mild consolidation in the inferior lingula along its posterior aspect. On axial slice 56 series 10, there is a  4 mm nodular opacity in the lateral segment of the right lower lobe, seen on axial slice 56 series 10. There is a 4 mm nodular opacity in the superior segment of the right lower lobe seen on axial slice 57 series 10. No pleural effusion or pleural thickening evident. Upper Abdomen: There is hepatic steatosis. There is diffuse stool in the visualized portions of colon. Visualized upper abdominal structures elsewhere appear unremarkable. Musculoskeletal: There is anterior wedging of the T11 vertebral body, stable. There is anterior wedging of the T8 vertebral body, not present on prior study. There is anterior wedging of the T4 vertebral body, stable from prior study. No chest wall lesions evident. No blastic or lytic bone lesions evident. Review of the MIP images  confirms the above findings. IMPRESSION: 1. No demonstrable pulmonary embolus. No thoracic aortic aneurysm or dissection. 2. Foci of apparent pneumonia in the inferior lingula posteriorly and in the anterior segment of the left lower lobe. Areas of patchy atelectasis noted bilaterally. 4 mm nodular opacities noted in the right lower lobe. No follow-up needed if patient is low-risk (and has no known or suspected primary neoplasm). Non-contrast chest CT can be considered in 12 months if patient is high-risk. This recommendation follows the consensus statement: Guidelines for Management of Incidental Pulmonary Nodules Detected on CT Images: From the Fleischner Society 2017; Radiology 2017; 284:228-243. 3.  Moderate hiatal hernia. 4.  No appreciable thoracic adenopathy. 5.  Hepatic steatosis. 6. Anterior wedge fracture at T8, not present 1 year prior. Other fractures in the thoracic region appear stable compared to prior study from 1 year prior. Electronically Signed   By: Bretta Bang III M.D.   On: 04/10/2018 10:15    Scheduled Meds: . aspirin EC  81 mg Oral Daily  . budesonide (PULMICORT) nebulizer solution  0.25 mg Nebulization BID  . DULoxetine  30 mg Oral QPC breakfast  . enoxaparin (LOVENOX) injection  40 mg Subcutaneous Q24H  . esomeprazole  40 mg Oral QAC breakfast  . gabapentin  800 mg Oral TID  . guaiFENesin  600 mg Oral BID  . hydrOXYzine  25 mg Oral QHS  . ipratropium-albuterol  3 mL Nebulization TID  . linaclotide  290 mcg Oral QAC breakfast  . predniSONE  40 mg Oral Q breakfast  . QUEtiapine  400 mg Oral QHS    Continuous Infusions: . sodium chloride 20 mL/hr at 04/11/18 0544  . cefTRIAXone (ROCEPHIN)  IV 1 g (04/10/18 1135)     LOS: 0 days     Laverna Peace, MD [facility] Pager [phone]  If 7PM-7AM, please contact night-coverage [email or web address] Password [email or web address] 04/11/2018, 9:42 AM

## 2018-04-11 NOTE — Evaluation (Signed)
Physical Therapy Evaluation Patient Details Name: Catherine Butler MRN: [id number] DOB: 1960-11-28 Today's Date: 04/11/2018   History of Present Illness  57 y.o. female with medical history significant of COPD, DM, EtOH abuse, T11 endplate fracture, ongoing tobacco use, anxiety, presents to the hospital with complaints of shortness of breath.  Clinical Impression  Pt admitted with above diagnosis. Pt currently with functional limitations due to the deficits listed below (see PT Problem List). Pt ambulated 110' with RW, no loss of balance, SaO2 97% on room air, HR 138 max, 2/4 dyspnea.  Pt will benefit from skilled PT to increase their independence and safety with mobility to allow discharge to the venue listed below.       Follow Up Recommendations Home health PT    Equipment Recommendations  None recommended by PT    Recommendations for Other Services       Precautions / Restrictions Precautions Precautions: Fall Precaution Comments: pt reports numerous falls in past 1 year (too many to count) Restrictions Weight Bearing Restrictions: No      Mobility  Bed Mobility Overal bed mobility: Modified Independent             General bed mobility comments: HOB up 30*  Transfers Overall transfer level: Needs assistance Equipment used: Rolling walker (2 wheeled) Transfers: Sit to/from Stand Sit to Stand: Supervision         General transfer comment: VCs hand placement  Ambulation/Gait Ambulation/Gait assistance: Min guard Gait Distance (Feet): 110 Feet Assistive device: Rolling walker (2 wheeled) Gait Pattern/deviations: Step-through pattern Gait velocity: WFL   General Gait Details: distance limited by fatigue/SOB. SaO2 97% on room air walking, HR 138 max walking, 2/4 dyspnea  Stairs            Wheelchair Mobility    Modified Rankin (Stroke Patients Only)       Balance Overall balance assessment: History of Falls;Needs assistance   Sitting  balance-Leahy Scale: Good       Standing balance-Leahy Scale: Fair                               Pertinent Vitals/Pain Pain Assessment: 0-10 Pain Score: 8  Pain Location: back  Pain Descriptors / Indicators: Sore Pain Intervention(s): Limited activity within patient's tolerance;Monitored during session;Premedicated before session;Repositioned    Home Living Family/patient expects to be discharged to:: Private residence Living Arrangements: Parent(lives with mom and dad) Available Help at Discharge: Family;Available 24 hours/day Type of Home: House Home Access: Stairs to enter Entrance Stairs-Rails: None Entrance Stairs-Number of Steps: 3 Home Layout: One level Home Equipment: Environmental consultant - 2 wheels      Prior Function Level of Independence: Independent with assistive device(s)         Comments: used RW prn     Hand Dominance   Dominant Hand: Right    Extremity/Trunk Assessment   Upper Extremity Assessment Upper Extremity Assessment: Overall WFL for tasks assessed    Lower Extremity Assessment Lower Extremity Assessment: Overall WFL for tasks assessed    Cervical / Trunk Assessment Cervical / Trunk Assessment: Normal  Communication   Communication: No difficulties  Cognition Arousal/Alertness: Awake/alert Behavior During Therapy: WFL for tasks assessed/performed Overall Cognitive Status: Within Functional Limits for tasks assessed  General Comments      Exercises     Assessment/Plan    PT Assessment Patient needs continued PT services  PT Problem List Decreased mobility;Decreased activity tolerance;Cardiopulmonary status limiting activity;Decreased balance       PT Treatment Interventions Gait training;Therapeutic activities;Therapeutic exercise;Functional mobility training;Patient/family education    PT Goals (Current goals can be found in the Care Plan section)  Acute Rehab PT  Goals Patient Stated Goal: be able to walk farther PT Goal Formulation: With patient Time For Goal Achievement: 04/25/18 Potential to Achieve Goals: Fair    Frequency Min 3X/week   Barriers to discharge        Co-evaluation               AM-PAC PT "6 Clicks" Daily Activity  Outcome Measure Difficulty turning over in bed (including adjusting bedclothes, sheets and blankets)?: A Little Difficulty moving from lying on back to sitting on the side of the bed? : A Little Difficulty sitting down on and standing up from a chair with arms (e.g., wheelchair, bedside commode, etc,.)?: A Little Help needed moving to and from a bed to chair (including a wheelchair)?: A Little Help needed walking in hospital room?: A Little Help needed climbing 3-5 steps with a railing? : A Little 6 Click Score: 18    End of Session Equipment Utilized During Treatment: Gait belt Activity Tolerance: Patient limited by fatigue Patient left: in chair;with call bell/phone within reach;with nursing/sitter in room(student nurse in room) Nurse Communication: Mobility status PT Visit Diagnosis: Repeated falls (R29.6);Difficulty in walking, not elsewhere classified (R26.2)    Time: 1610-9604 PT Time Calculation (min) (ACUTE ONLY): 17 min   Charges:   PT Evaluation $PT Eval Low Complexity: 1 Low          Ralene Bathe Kistler PT 04/11/2018  Acute Rehabilitation Services Pager [phone] Office [phone]

## 2018-04-11 NOTE — Progress Notes (Signed)
Patient instructed on use of flutter valve,.

## 2018-04-11 NOTE — Progress Notes (Signed)
   04/11/18 1315  MEWS Score  Resp 18  Pulse Rate (!) 114  SpO2 98 %  O2 Device Room Air  MEWS Score  MEWS RR 0  MEWS Pulse 2  MEWS Systolic 0  MEWS LOC 0  MEWS Temp 0  MEWS Score 2  MEWS Score Color Yellow  MEWS Assessment  Is this an acute change? No (pt has copd and PNA)

## 2018-04-11 NOTE — Progress Notes (Signed)
SATURATION QUALIFICATIONS: (This note is used to comply with regulatory documentation for home oxygen)  Patient Saturations on Room Air at Rest = 97%  Patient Saturations on Room Air while Ambulating = 97%  Patient Saturations on  Liters of oxygen while Ambulating = %  Please briefly explain why patient needs home oxygen:

## 2018-04-12 LAB — BASIC METABOLIC PANEL
Anion gap: 9 (ref 5–15)
BUN: 8 mg/dL (ref 6–20)
CHLORIDE: 108 mmol/L (ref 98–111)
CO2: 22 mmol/L (ref 22–32)
Calcium: 8 mg/dL — ABNORMAL LOW (ref 8.9–10.3)
Creatinine, Ser: 0.55 mg/dL (ref 0.44–1.00)
GFR calc Af Amer: >60 mL/min (ref >60)
GFR calc non Af Amer: >60 mL/min (ref >60)
Glucose, Bld: 102 mg/dL — ABNORMAL HIGH (ref 70–99)
POTASSIUM: 4.5 mmol/L (ref 3.5–5.1)
SODIUM: 139 mmol/L (ref 135–145)

## 2018-04-12 LAB — CBC
HEMATOCRIT: 30.2 % — AB (ref 36.0–46.0)
Hemoglobin: 9.5 g/dL — ABNORMAL LOW (ref 12.0–15.0)
MCH: 30.8 pg (ref 26.0–34.0)
MCHC: 31.5 g/dL (ref 30.0–36.0)
MCV: 98.1 fL (ref 80.0–100.0)
NRBC: 0 % (ref 0.0–0.2)
PLATELETS: 220 10*3/uL (ref 150–400)
RBC: 3.08 MIL/uL — ABNORMAL LOW (ref 3.87–5.11)
RDW: 17.7 % — AB (ref 11.5–15.5)
WBC: 6.5 10*3/uL (ref 4.0–10.5)

## 2018-04-12 MED ORDER — PREDNISONE 10 MG PO TABS: ORAL_TABLET | ORAL | 0 refills | Status: DC

## 2018-04-12 MED ORDER — AMOXICILLIN-POT CLAVULANATE 875-125 MG PO TABS: 1.0000 | ORAL_TABLET | Freq: Two times a day (BID) | ORAL | 0 refills | Status: DC

## 2018-04-12 MED ORDER — AMOXICILLIN-POT CLAVULANATE 875-125 MG PO TABS: 1.0000 | ORAL_TABLET | Freq: Two times a day (BID) | ORAL | Status: DC

## 2018-04-12 NOTE — Discharge Summary (Signed)
Physician Discharge Summary  Catherine Butler:[id number] DOB: 1961/05/29 DOA: 04/10/2018  PCP: Loletta Specter, PA-C  Admit date: 04/10/2018 Discharge date: 04/12/2018  Admitted From: home Disposition:  Home  Recommendations for Outpatient Follow-up:  1. Follow up with PCP in 1-2 weeks 2. 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 After 2 days she was much improved saturating greater than 90% on room air she was changed to Augmentin which she will continue as an outpatient.  Mild COPD exacerbation: She was started empirically on IV steroids and antibiotics and inhalers. She was transitioned to oral antibiotics and steroids, she will continue steroid taper as an outpatient.  Sinus tachycardia: Likely reactive now resolved.  Anxiety disorder: Continue Cymbalta.    Discharge Instructions  Discharge Instructions    Diet - low sodium heart healthy   Complete by:  As directed    Increase activity slowly   Complete by:  As directed      Allergies as of 04/12/2018      Reactions   Influenza Vaccines Other (See Comments)   Patient got Guillain Barre   Lipitor [atorvastatin] Other (See Comments)   "really bad leg pain"      Medication List     TAKE these medications   acetaminophen 500 MG tablet Commonly known as:  TYLENOL Take 1,000 mg by mouth 2 (two) times daily as needed for headache.   albuterol 108 (90 Base) MCG/ACT inhaler Commonly known as:  PROVENTIL HFA;VENTOLIN HFA Inhale 2 puffs into the lungs every 6 (six) hours as needed for wheezing.   amoxicillin-clavulanate 875-125 MG tablet Commonly known as:  AUGMENTIN Take 1 tablet by mouth every 12 (twelve) hours.   aspirin EC 81 MG tablet Take 1 tablet (81 mg total) by mouth daily.   DULoxetine 30 MG capsule Commonly known as:  CYMBALTA TAKE 1 CAPSULE BY MOUTH ONCE DAILY What changed:  when to take this   esomeprazole 40 MG packet Commonly known as:  NEXIUM Take 40 mg by mouth daily before breakfast.   gabapentin 600 MG tablet Commonly known as:  NEURONTIN TAKE 2 TABLETS BY MOUTH 3 TIMES DAILY What changed:  See the new instructions.   hydrOXYzine 25 MG tablet Commonly known as:  ATARAX/VISTARIL Take 1 tablet (25 mg total) by mouth at bedtime.   ibuprofen 200 MG tablet Commonly known as:  ADVIL,MOTRIN Take 400 mg by mouth every 6 (six) hours as needed for fever, headache, mild pain, moderate pain or cramping.   linaclotide 290 MCG Caps capsule Commonly known as:  LINZESS Take 1 capsule (290 mcg total) by mouth daily before breakfast.   nicotine 21 mg/24hr patch Commonly known as:  NICODERM CQ - dosed in mg/24 hours Place 1 patch (21 mg total) onto the skin daily.   predniSONE 10 MG tablet Commonly known as:  DELTASONE  Takes 6 tablets for 1 days, then 5 tablets for 1 days, then 4 tablets for 1 days, then 3 tablets for 1 days, then 2 tabs for 1 days, then 1 tab for 1 days, and then stop.   QUEtiapine 400 MG tablet Commonly known as:  SEROQUEL Take 400 mg by mouth at bedtime.   varenicline 0.5 MG X 11 & 1 MG X 42 tablet Commonly known as:  CHANTIX PAK Take one 0.5 mg tablet by mouth once daily for 3 days, then increase to one 0.5 mg tablet twice  daily for 4 days, then increase to one 1 mg tablet twice daily.      Follow-up Information    Loletta Specter, PA-C Follow up.   Specialty:  Physician Assistant Contact information: [address] [place] [address] [phone]          Allergies  Allergen Reactions  . 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 Lipitor [Atorvastatin] Other (See Comments)    "really bad leg pain"    Consultations:  None   Procedures/Studies: Ct Head Wo Contrast  Result Date: 03/30/2018 CLINICAL DATA:  Left-sided numbness for several weeks EXAM: CT HEAD WITHOUT CONTRAST TECHNIQUE: Contiguous axial images were obtained from the base of the skull through the vertex without intravenous contrast. COMPARISON:  March 16, 2012 FINDINGS: Brain: The ventricles are normal in size and configuration. There is no intracranial mass, hemorrhage, extra-axial fluid collection, or midline shift. Brain parenchyma appears unremarkable. No evident acute infarct. Vascular: No hyperdense vessel.  No evident vascular calcifications. Skull: The bony calvarium appears intact. Sinuses/Orbits: Visualized paranasal sinuses are clear. Orbits appear symmetric bilaterally. Other: Mastoid air cells are clear. IMPRESSION: Study within normal limits. Electronically Signed   By: Bretta Bang III M.D.   On: 03/30/2018 10:03   Ct Angio Chest Pe W/cm &/or Wo Cm  Result Date: 04/10/2018 CLINICAL DATA:  Shortness of breath EXAM: CT ANGIOGRAPHY CHEST WITH CONTRAST TECHNIQUE: Multidetector CT imaging of the chest was performed using the standard protocol during bolus administration of intravenous contrast. Multiplanar CT image reconstructions and MIPs were obtained to evaluate the vascular anatomy. CONTRAST:  100 mL ISOVUE-370 IOPAMIDOL (ISOVUE-370) INJECTION 76% COMPARISON:  Chest CT April 18, 2017; chest radiograph June 20, 2017 FINDINGS: Cardiovascular: There is no demonstrable  pulmonary embolus. There is no thoracic aortic aneurysm or dissection. Visualized great vessels appear unremarkable. No pericardial effusion or pericardial thickening is evident. Mediastinum/Nodes: Thyroid appears normal. There is no appreciable thoracic adenopathy. There is a moderate hiatal hernia. Lungs/Pleura: There is focal airspace consolidation in a portion of the anterior segment of the left lower lobe. There are areas of atelectatic change elsewhere in the left lower lobe. There are also areas of patchy atelectasis in each upper lobe. There is scarring in the right apex. There is mild consolidation in the inferior lingula along its posterior aspect. On axial slice 56 series 10, there is a 4 mm nodular opacity in the lateral segment of the right lower lobe, seen on axial slice 56 series 10. There is a 4 mm nodular opacity in the superior segment of the right lower lobe seen on axial slice 57 series 10. No pleural effusion or pleural thickening evident. Upper Abdomen: There is hepatic steatosis. There is diffuse stool in the visualized portions of colon. Visualized upper abdominal structures elsewhere appear unremarkable. Musculoskeletal: There is anterior wedging of the T11 vertebral body, stable. There is anterior wedging of  the T8 vertebral body, not present on prior study. There is anterior wedging of the T4 vertebral body, stable from prior study. No chest wall lesions evident. No blastic or lytic bone lesions evident. Review of the MIP images confirms the above findings. IMPRESSION: 1. No demonstrable pulmonary embolus. No thoracic aortic aneurysm or dissection. 2. Foci of apparent pneumonia in the inferior lingula posteriorly and in the anterior segment of the left lower lobe. Areas of patchy atelectasis noted bilaterally. 4 mm nodular opacities noted in the right lower lobe. No follow-up needed if patient is low-risk (and has no known or suspected primary neoplasm). Non-contrast chest CT can be  considered in 12 months if patient is high-risk. This recommendation follows the consensus statement: Guidelines for Management of Incidental Pulmonary Nodules Detected on CT Images: From the Fleischner Society 2017; Radiology 2017; 284:228-243. 3.  Moderate hiatal hernia. 4.  No appreciable thoracic adenopathy. 5.  Hepatic steatosis. 6. Anterior wedge fracture at T8, not present 1 year prior. Other fractures in the thoracic region appear stable compared to prior study from 1 year prior. Electronically Signed   By: Bretta Bang III M.D.   On: 04/10/2018 10:15      Subjective: No complains  Discharge Exam: Vitals:   04/12/18 0450 04/12/18 0809  BP: 121/87   Pulse: (!) 107   Resp: 20   Temp: 98.1 F (36.7 C)   SpO2: 99% 95%   Vitals:   04/11/18 1949 04/11/18 2032 04/12/18 0450 04/12/18 0809  BP:  (!) 141/99 121/87   Pulse:  (!) 113 (!) 107   Resp:  17 20   Temp:  98.2 F (36.8 C) 98.1 F (36.7 C)   TempSrc:  Oral Oral   SpO2: 96% 98% 99% 95%  Weight:      Height:        General: Pt is alert, awake, not in acute distress Cardiovascular: RRR, S1/S2 +, no rubs, no gallops Respiratory: CTA bilaterally, no wheezing, no rhonchi Abdominal: Soft, NT, ND, bowel sounds + Extremities: no edema, no cyanosis    The results of significant diagnostics from this hospitalization (including imaging, microbiology, ancillary and laboratory) are listed below for reference.     Microbiology: Recent Results (from the past 240 hour(s))  Culture, sputum-assessment     Status: None   Collection Time: 04/11/18  4:00 PM  Result Value Ref Range Status   Specimen Description EXPECTORATED SPUTUM  Final   Special Requests NONE  Final   Sputum evaluation   Final    Sputum specimen not acceptable for testing.  Please recollect.   NOTIFIED D.KAUR RN 04/11/2018 1706 JR Performed at [facility], [address], [place], [place] [address]    Report Status 04/11/2018 FINAL   Final     Labs: BNP (last 3 results) Recent Labs    04/10/18 0857  BNP 15.5   Basic Metabolic Panel: Recent Labs  Lab 04/10/18 0857 04/11/18 0554 04/12/18 0623  NA 135 139 139  K 3.7 3.3* 4.5  CL 106 112* 108  CO2 20* 22 22  GLUCOSE 108* 123* 102*  BUN 10 10 8   CREATININE 0.72 0.62 0.55  CALCIUM 8.1* 8.3* 8.0*   Liver Function Tests: No results for input(s): AST, ALT, ALKPHOS, BILITOT, PROT, ALBUMIN in the last 168 hours. No results for input(s): LIPASE, AMYLASE in the last 168 hours. No results for input(s): AMMONIA in the last 168 hours. CBC: Recent Labs  Lab 04/10/18 0857 04/11/18 0554 04/12/18  0623  WBC 5.8 6.8 6.5  NEUTROABS 3.5  --   --   HGB 11.1* 9.1* 9.5*  HCT 34.9* 28.4* 30.2*  MCV 96.7 97.6 98.1  PLT 268 215 220   Cardiac Enzymes: Recent Labs  Lab 04/10/18 0857  TROPONINI <0.03   BNP: Invalid input(s): POCBNP CBG: No results for input(s): GLUCAP in the last 168 hours. D-Dimer No results for input(s): DDIMER in the last 72 hours. Hgb A1c No results for input(s): HGBA1C in the last 72 hours. Lipid Profile No results for input(s): CHOL, HDL, LDLCALC, TRIG, CHOLHDL, LDLDIRECT in the last 72 hours. Thyroid function studies Recent Labs    04/11/18 1005  TSH 0.690   Anemia work up No results for input(s): VITAMINB12, FOLATE, FERRITIN, TIBC, IRON, RETICCTPCT in the last 72 hours. Urinalysis    Component Value Date/Time   COLORURINE YELLOW 08/09/2017 1735   APPEARANCEUR CLEAR 08/09/2017 1735   LABSPEC 1.020 08/09/2017 1735   PHURINE 7.0 08/09/2017 1735   GLUCOSEU NEGATIVE 08/09/2017 1735   HGBUR NEGATIVE 08/09/2017 1735   BILIRUBINUR NEGATIVE 08/09/2017 1735   KETONESUR NEGATIVE 08/09/2017 1735   PROTEINUR NEGATIVE 08/09/2017 1735   UROBILINOGEN 0.2 09/13/2012 0124   NITRITE NEGATIVE 08/09/2017 1735   LEUKOCYTESUR NEGATIVE 08/09/2017 1735   Sepsis Labs Invalid input(s): PROCALCITONIN,  WBC,  LACTICIDVEN Microbiology Recent Results  (from the past 240 hour(s))  Culture, sputum-assessment     Status: None   Collection Time: 04/11/18  4:00 PM  Result Value Ref Range Status   Specimen Description EXPECTORATED SPUTUM  Final   Special Requests NONE  Final   Sputum evaluation   Final    Sputum specimen not acceptable for testing.  Please recollect.   NOTIFIED D.KAUR RN 04/11/2018 1706 JR Performed at [facility], [address], [place], [place] [address]    Report Status 04/11/2018 FINAL  Final     Time coordinating discharge: 40 minutes  SIGNED:   Marinda Elk, MD  [facility] 04/12/2018, 11:26 AM Pager   If 7PM-7AM, please contact night-coverage [email or web address] Password [email or web address]

## 2018-04-12 NOTE — Care Management Note (Signed)
Case Management Note  Patient Details  Name: Catherine Butler MRN: [id number] Date of Birth: 1961/01/11  Subjective/Objective:                  Discharged to home with self care  Action/Plan:  Discharge to home with self care, orders checked for hhc needs. No CM needs present at time of discharge.  Expected Discharge Date:  04/12/18               Expected Discharge Plan:  Home/Self Care  In-House Referral:     Discharge planning Services  CM Consult  Post Acute Care Choice:    Choice offered to:     DME Arranged:    DME Agency:     HH Arranged:    HH Agency:     Status of Service:  Completed, signed off  If discussed at [employer] of Stay Meetings, dates discussed:    Additional Comments:  Golda Acre, RN 04/12/2018, 1:40 PM

## 2018-04-20 ENCOUNTER — Telehealth (INDEPENDENT_AMBULATORY_CARE_PROVIDER_SITE_OTHER): Payer: Self-pay

## 2018-04-20 NOTE — Telephone Encounter (Signed)
Pt has not been told to take Gabapentin the way she is taking it. She should not be taking Gabapentin 400 mg.

## 2018-04-20 NOTE — Telephone Encounter (Signed)
Wilkie Aye, RN with [facility] called to report that patient has filled gabapentin 600 mg and gabapentin 400 mg. States patient said that her provider told her that it was ok for her to take the 400 mg in between the 600mg . Wilkie Aye advised patient to not take 400mg  until she sees PCP on 04/25/18. Maryjean Morn, CMA

## 2018-04-21 NOTE — Telephone Encounter (Signed)
Patient aware to not take Gabapentin 400 mg. Catherine Butler, CMA

## 2018-04-25 ENCOUNTER — Other Ambulatory Visit: Payer: Self-pay

## 2018-04-25 ENCOUNTER — Ambulatory Visit (INDEPENDENT_AMBULATORY_CARE_PROVIDER_SITE_OTHER): Payer: Medicaid Other | Admitting: Physician Assistant

## 2018-04-25 ENCOUNTER — Encounter (INDEPENDENT_AMBULATORY_CARE_PROVIDER_SITE_OTHER): Payer: Self-pay | Admitting: Physician Assistant

## 2018-04-25 VITALS — BP 144/98 | HR 100 | Temp 97.8°F | Ht 66.0 in | Wt 153.6 lb

## 2018-04-25 DIAGNOSIS — R059 Cough, unspecified: Secondary | ICD-10-CM | POA: Insufficient documentation

## 2018-04-25 DIAGNOSIS — R05 Cough: Secondary | ICD-10-CM | POA: Insufficient documentation

## 2018-04-25 MED ORDER — LEVOFLOXACIN 500 MG PO TABS: 500.0000 mg | ORAL_TABLET | Freq: Every day | ORAL | 0 refills | Status: DC

## 2018-04-25 MED ORDER — BENZONATATE 200 MG PO CAPS: 200.0000 mg | ORAL_CAPSULE | Freq: Every day | ORAL | 0 refills | Status: DC

## 2018-04-25 MED ORDER — GUAIFENESIN ER 1200 MG PO TB12: 1.0000 | ORAL_TABLET | Freq: Two times a day (BID) | ORAL | 0 refills | Status: AC

## 2018-04-25 NOTE — Progress Notes (Signed)
Subjective:  Patient ID: Catherine Butler, female    DOB: Feb 13, 1961  Age: 57 y.o. MRN: [id number]  CC: hospital discharge f/u, paresthesia of left leg and arm, cough.  HPI Catherine Butler a 57 y.o.femalewith a PMH of anxiety, asthma,HTNarthritis, COPD, depression, suicide attempt by multiple drug overdose,Guillan Barre,and acid reflux presents to f/u on paresthesia of the left arm and left leg that began two months ago. She was seen here one month ago when she initially complained of the paresthesias. She was advised to go to the ED after lab abnormalities were found. She had an elevated d-dimer of 4.16 mg/L and triglycerides were 965 mg/dL. CT of the head at ED was within normal limits. Of note, pt has been referred to neurology and MRI was performed. Dr Tat, Neurologist reviewed MR and found MR to be unremarkable without any acute changes. Unfortunately patient continues to smoke and drink alcohol. Had been prescribed nicotine patches but has not begun to use them.     Pt went back to ED 11 days later on 04/10/18 with complaint of a "cold" of one month duration that did not improve with outpt antibiotics. Pt had CTA chest done and found to have pneumonia. No sign of PE. Says she has a recurrence of cough and is feeling left mid back pain in the area of her left lung base. Reports having been visiting her mother at the hospital for pneumonia. No associated fever, chills, rash, swelling, malaise, or fatigue out of baseline.     Outpatient Medications Prior to Visit  Medication Sig Dispense Refill  . acetaminophen (TYLENOL) 500 MG tablet Take 1,000 mg by mouth 2 (two) times daily as needed for headache.    . albuterol (PROVENTIL HFA;VENTOLIN HFA) 108 (90 Base) MCG/ACT inhaler Inhale 2 puffs into the lungs every 6 (six) hours as needed for wheezing. 1 Inhaler 11  . aspirin EC 81 MG tablet Take 1 tablet (81 mg total) by mouth daily. 90 tablet 3  . DULoxetine (CYMBALTA) 30 MG capsule TAKE 1 CAPSULE  BY MOUTH ONCE DAILY (Patient taking differently: Take 30 mg by mouth daily after breakfast. ) 30 capsule 3  . esomeprazole (NEXIUM) 40 MG packet Take 40 mg by mouth daily before breakfast. 30 each 11  . gabapentin (NEURONTIN) 600 MG tablet TAKE 2 TABLETS BY MOUTH 3 TIMES DAILY (Patient taking differently: Take 1,200 mg by mouth 3 (three) times daily. ) 180 tablet 2  . hydrOXYzine (ATARAX/VISTARIL) 25 MG tablet Take 1 tablet (25 mg total) by mouth at bedtime. 30 tablet 1  . ibuprofen (ADVIL,MOTRIN) 200 MG tablet Take 400 mg by mouth every 6 (six) hours as needed for fever, headache, mild pain, moderate pain or cramping.    . linaclotide (LINZESS) 290 MCG CAPS capsule Take 1 capsule (290 mcg total) by mouth daily before breakfast. 30 capsule 3  . QUEtiapine (SEROQUEL) 400 MG tablet Take 400 mg by mouth at bedtime.    . nicotine (NICODERM CQ - DOSED IN MG/24 HOURS) 21 mg/24hr patch Place 1 patch (21 mg total) onto the skin daily. (Patient not taking: Reported on 04/10/2018) 28 patch 1  . amoxicillin-clavulanate (AUGMENTIN) 875-125 MG tablet Take 1 tablet by mouth every 12 (twelve) hours. 10 tablet 0  . predniSONE (DELTASONE) 10 MG tablet Takes 6 tablets for 1 days, then 5 tablets for 1 days, then 4 tablets for 1 days, then 3 tablets for 1 days, then 2 tabs for 1 days, then 1 tab for  1 days, and then stop. 21 tablet 0  . varenicline (CHANTIX STARTING MONTH PAK) 0.5 MG X 11 & 1 MG X 42 tablet Take one 0.5 mg tablet by mouth once daily for 3 days, then increase to one 0.5 mg tablet twice daily for 4 days, then increase to one 1 mg tablet twice daily. (Patient not taking: Reported on 03/28/2018) 53 tablet 0   No facility-administered medications prior to visit.      ROS Review of Systems  Constitutional: Negative for chills, fever and malaise/fatigue.  Eyes: Negative for blurred vision.  Respiratory: Positive for cough. Negative for shortness of breath.   Cardiovascular: Negative for chest pain and  palpitations.  Gastrointestinal: Negative for abdominal pain and nausea.  Genitourinary: Negative for dysuria and hematuria.  Musculoskeletal: Positive for back pain. Negative for joint pain and myalgias.  Skin: Negative for rash.  Neurological: Negative for tingling and headaches.  Psychiatric/Behavioral: Negative for depression. The patient is not nervous/anxious.     Objective:  BP (!) 144/98 (BP Location: Right Arm, Patient Position: Sitting, Cuff Size: Normal)   Pulse 100   Temp 97.8 F (36.6 C) (Oral)   Ht 5\' 6"  (1.676 m)   Wt 153 lb 9.6 oz (69.7 kg)   LMP 02/22/2006   SpO2 94%   BMI 24.79 kg/m   BP/Weight 04/25/2018 04/12/2018 04/10/2018  Systolic BP 144 121 -  Diastolic BP 98 87 -  Wt. (Lbs) 153.6 - 155.42  BMI 24.79 - 25.09      Physical Exam  Constitutional: She is oriented to person, place, and time.  Well developed, well nourished, NAD, polite  HENT:  Head: Normocephalic and atraumatic.  Turbinates patent. No oropharyngeal exudates. No postnasal drip.   Eyes: No scleral icterus.  Neck: Normal range of motion. Neck supple. No thyromegaly present.  Cardiovascular: Normal rate, regular rhythm and normal heart sounds.  Pulmonary/Chest: Effort normal.  Mild coarse rales in left lung base.  Musculoskeletal: She exhibits no edema.  Lymphadenopathy:    She has no cervical adenopathy.  Neurological: She is alert and oriented to person, place, and time.  Skin: Skin is warm and dry. No rash noted. No erythema. No pallor.  Psychiatric: She has a normal mood and affect. Her behavior is normal. Thought content normal.  Vitals reviewed.    Assessment & Plan:    1. Cough - Begin levofloxacin (LEVAQUIN) 500 MG tablet; Take 1 tablet (500 mg total) by mouth daily.  Dispense: 7 tablet; Refill: 0 - Begin Guaifenesin (MUCINEX MAXIMUM STRENGTH) 1200 MG TB12; Take 1 tablet (1,200 mg total) by mouth 2 (two) times daily for 7 days.  Dispense: 14 tablet; Refill: 0 - Begin  benzonatate (TESSALON) 200 MG capsule; Take 1 capsule (200 mg total) by mouth at bedtime.  Dispense: 14 capsule; Refill: 0   Meds ordered this encounter  Medications  . levofloxacin (LEVAQUIN) 500 MG tablet    Sig: Take 1 tablet (500 mg total) by mouth daily.    Dispense:  7 tablet    Refill:  0    Order Specific Question:   Supervising Provider    Answer:   Hoy Register [[id number]]  . Guaifenesin (MUCINEX MAXIMUM STRENGTH) 1200 MG TB12    Sig: Take 1 tablet (1,200 mg total) by mouth 2 (two) times daily for 7 days.    Dispense:  14 tablet    Refill:  0    Order Specific Question:   Supervising Provider    Answer:  NEWLIN, ENOBONG [[id number]]  . benzonatate (TESSALON) 200 MG capsule    Sig: Take 1 capsule (200 mg total) by mouth at bedtime.    Dispense:  14 capsule    Refill:  0    Order Specific Question:   Supervising Provider    Answer:   Hoy Register [[id number]]    Follow-up: Return in about 8 weeks (around 06/20/2018).   Loletta Specter PA

## 2018-04-25 NOTE — Patient Instructions (Signed)

## 2018-05-10 ENCOUNTER — Telehealth (INDEPENDENT_AMBULATORY_CARE_PROVIDER_SITE_OTHER): Payer: Self-pay | Admitting: Physician Assistant

## 2018-05-10 NOTE — Telephone Encounter (Signed)
Catherine ReamerKristy Butler is a Investment banker, operationalnurse care manager and called to inform that Catherine Butler insurance does not cover Nurse Care. Therefore she will not have these services anymore.   Catherine AyeKristy [phone]  Thank you Catherine Butler

## 2018-05-10 NOTE — Telephone Encounter (Signed)
Noted. Pt will have to keep f/u appointments so we may keep track of her health.

## 2018-05-10 NOTE — Telephone Encounter (Signed)
FWD to PCP. Tempestt S Roberts, CMA  

## 2018-05-31 ENCOUNTER — Encounter (HOSPITAL_COMMUNITY): Payer: Self-pay | Admitting: *Deleted

## 2018-05-31 ENCOUNTER — Other Ambulatory Visit: Payer: Self-pay

## 2018-05-31 ENCOUNTER — Emergency Department (HOSPITAL_COMMUNITY): Payer: Medicaid Other

## 2018-05-31 ENCOUNTER — Inpatient Hospital Stay (HOSPITAL_COMMUNITY)
Admission: EM | Admit: 2018-05-31 | Discharge: 2018-06-02 | DRG: 542 | Disposition: A | Discharge status: Home Health | Payer: Medicaid Other | Attending: Internal Medicine | Admitting: Internal Medicine

## 2018-05-31 DIAGNOSIS — K219 Gastro-esophageal reflux disease without esophagitis: Secondary | ICD-10-CM | POA: Diagnosis present

## 2018-05-31 DIAGNOSIS — F40240 Claustrophobia: Secondary | ICD-10-CM | POA: Diagnosis present

## 2018-05-31 DIAGNOSIS — W19XXXA Unspecified fall, initial encounter: Secondary | ICD-10-CM | POA: Diagnosis present

## 2018-05-31 DIAGNOSIS — S2239XA Fracture of one rib, unspecified side, initial encounter for closed fracture: Secondary | ICD-10-CM

## 2018-05-31 DIAGNOSIS — Z7982 Long term (current) use of aspirin: Secondary | ICD-10-CM

## 2018-05-31 DIAGNOSIS — F419 Anxiety disorder, unspecified: Secondary | ICD-10-CM | POA: Diagnosis present

## 2018-05-31 DIAGNOSIS — G9341 Metabolic encephalopathy: Secondary | ICD-10-CM | POA: Diagnosis present

## 2018-05-31 DIAGNOSIS — K851 Biliary acute pancreatitis without necrosis or infection: Secondary | ICD-10-CM | POA: Diagnosis present

## 2018-05-31 DIAGNOSIS — F1010 Alcohol abuse, uncomplicated: Secondary | ICD-10-CM | POA: Diagnosis present

## 2018-05-31 DIAGNOSIS — K8050 Calculus of bile duct without cholangitis or cholecystitis without obstruction: Secondary | ICD-10-CM | POA: Diagnosis present

## 2018-05-31 DIAGNOSIS — Y92009 Unspecified place in unspecified non-institutional (private) residence as the place of occurrence of the external cause: Secondary | ICD-10-CM

## 2018-05-31 DIAGNOSIS — R0781 Pleurodynia: Secondary | ICD-10-CM

## 2018-05-31 DIAGNOSIS — Z915 Personal history of self-harm: Secondary | ICD-10-CM

## 2018-05-31 DIAGNOSIS — Z825 Family history of asthma and other chronic lower respiratory diseases: Secondary | ICD-10-CM

## 2018-05-31 DIAGNOSIS — Z833 Family history of diabetes mellitus: Secondary | ICD-10-CM

## 2018-05-31 DIAGNOSIS — R933 Abnormal findings on diagnostic imaging of other parts of digestive tract: Secondary | ICD-10-CM

## 2018-05-31 DIAGNOSIS — M79602 Pain in left arm: Secondary | ICD-10-CM

## 2018-05-31 DIAGNOSIS — I10 Essential (primary) hypertension: Secondary | ICD-10-CM | POA: Diagnosis present

## 2018-05-31 DIAGNOSIS — K859 Acute pancreatitis without necrosis or infection, unspecified: Secondary | ICD-10-CM | POA: Diagnosis present

## 2018-05-31 DIAGNOSIS — R748 Abnormal levels of other serum enzymes: Secondary | ICD-10-CM | POA: Diagnosis present

## 2018-05-31 DIAGNOSIS — Z803 Family history of malignant neoplasm of breast: Secondary | ICD-10-CM

## 2018-05-31 DIAGNOSIS — W06XXXA Fall from bed, initial encounter: Secondary | ICD-10-CM | POA: Diagnosis present

## 2018-05-31 DIAGNOSIS — J449 Chronic obstructive pulmonary disease, unspecified: Secondary | ICD-10-CM | POA: Diagnosis present

## 2018-05-31 DIAGNOSIS — Z8249 Family history of ischemic heart disease and other diseases of the circulatory system: Secondary | ICD-10-CM

## 2018-05-31 DIAGNOSIS — Z8781 Personal history of (healed) traumatic fracture: Secondary | ICD-10-CM

## 2018-05-31 DIAGNOSIS — R296 Repeated falls: Secondary | ICD-10-CM | POA: Diagnosis present

## 2018-05-31 DIAGNOSIS — G610 Guillain-Barre syndrome: Secondary | ICD-10-CM | POA: Diagnosis present

## 2018-05-31 DIAGNOSIS — M4856XA Collapsed vertebra, not elsewhere classified, lumbar region, initial encounter for fracture: Secondary | ICD-10-CM | POA: Diagnosis present

## 2018-05-31 DIAGNOSIS — K861 Other chronic pancreatitis: Secondary | ICD-10-CM | POA: Diagnosis present

## 2018-05-31 DIAGNOSIS — Z79899 Other long term (current) drug therapy: Secondary | ICD-10-CM

## 2018-05-31 DIAGNOSIS — Z800 Family history of malignant neoplasm of digestive organs: Secondary | ICD-10-CM

## 2018-05-31 DIAGNOSIS — G8929 Other chronic pain: Secondary | ICD-10-CM | POA: Diagnosis present

## 2018-05-31 DIAGNOSIS — M4854XA Collapsed vertebra, not elsewhere classified, thoracic region, initial encounter for fracture: Principal | ICD-10-CM | POA: Diagnosis present

## 2018-05-31 DIAGNOSIS — Z841 Family history of disorders of kidney and ureter: Secondary | ICD-10-CM

## 2018-05-31 DIAGNOSIS — S22008A Other fracture of unspecified thoracic vertebra, initial encounter for closed fracture: Secondary | ICD-10-CM

## 2018-05-31 DIAGNOSIS — F329 Major depressive disorder, single episode, unspecified: Secondary | ICD-10-CM | POA: Diagnosis present

## 2018-05-31 DIAGNOSIS — S32000A Wedge compression fracture of unspecified lumbar vertebra, initial encounter for closed fracture: Secondary | ICD-10-CM

## 2018-05-31 DIAGNOSIS — Z791 Long term (current) use of non-steroidal anti-inflammatories (NSAID): Secondary | ICD-10-CM

## 2018-05-31 DIAGNOSIS — R109 Unspecified abdominal pain: Secondary | ICD-10-CM | POA: Diagnosis present

## 2018-05-31 DIAGNOSIS — F17210 Nicotine dependence, cigarettes, uncomplicated: Secondary | ICD-10-CM | POA: Diagnosis present

## 2018-05-31 DIAGNOSIS — S22000A Wedge compression fracture of unspecified thoracic vertebra, initial encounter for closed fracture: Secondary | ICD-10-CM

## 2018-05-31 HISTORY — DX: Guillain-Barre syndrome: G61.0

## 2018-05-31 LAB — CBC WITH DIFFERENTIAL/PLATELET
ABS IMMATURE GRANULOCYTES: 0.03 10*3/uL (ref 0.00–0.07)
Basophils Absolute: 0.1 10*3/uL (ref 0.0–0.1)
Basophils Relative: 1 %
EOS ABS: 0.1 10*3/uL (ref 0.0–0.5)
Eosinophils Relative: 2 %
HEMATOCRIT: 34.1 % — AB (ref 36.0–46.0)
HEMOGLOBIN: 10.8 g/dL — AB (ref 12.0–15.0)
Immature Granulocytes: 0 %
LYMPHS ABS: 1.9 10*3/uL (ref 0.7–4.0)
LYMPHS PCT: 26 %
MCH: 32 pg (ref 26.0–34.0)
MCHC: 31.7 g/dL (ref 30.0–36.0)
MCV: 100.9 fL — ABNORMAL HIGH (ref 80.0–100.0)
Monocytes Absolute: 0.8 10*3/uL (ref 0.1–1.0)
Monocytes Relative: 10 %
NEUTROS ABS: 4.4 10*3/uL (ref 1.7–7.7)
NEUTROS PCT: 61 %
NRBC: 0 % (ref 0.0–0.2)
PLATELETS: 234 10*3/uL (ref 150–400)
RBC: 3.38 MIL/uL — AB (ref 3.87–5.11)
RDW: 14 % (ref 11.5–15.5)
WBC: 7.2 10*3/uL (ref 4.0–10.5)

## 2018-05-31 LAB — COMPREHENSIVE METABOLIC PANEL
ALT: 11 U/L (ref 0–44)
ANION GAP: 10 (ref 5–15)
AST: 14 U/L — ABNORMAL LOW (ref 15–41)
Albumin: 2.6 g/dL — ABNORMAL LOW (ref 3.5–5.0)
Alkaline Phosphatase: 201 U/L — ABNORMAL HIGH (ref 38–126)
BILIRUBIN TOTAL: 0.5 mg/dL (ref 0.3–1.2)
BUN: 9 mg/dL (ref 6–20)
CO2: 22 mmol/L (ref 22–32)
Calcium: 8.1 mg/dL — ABNORMAL LOW (ref 8.9–10.3)
Chloride: 108 mmol/L (ref 98–111)
Creatinine, Ser: 0.71 mg/dL (ref 0.44–1.00)
GFR calc Af Amer: >60 mL/min (ref >60)
GLUCOSE: 113 mg/dL — AB (ref 70–99)
Potassium: 3.5 mmol/L (ref 3.5–5.1)
Sodium: 140 mmol/L (ref 135–145)
TOTAL PROTEIN: 5.9 g/dL — AB (ref 6.5–8.1)

## 2018-05-31 LAB — I-STAT CHEM 8, ED
BUN: 9 mg/dL (ref 6–20)
CALCIUM ION: 1.06 mmol/L — AB (ref 1.15–1.40)
CHLORIDE: 107 mmol/L (ref 98–111)
CREATININE: 0.7 mg/dL (ref 0.44–1.00)
GLUCOSE: 107 mg/dL — AB (ref 70–99)
HEMATOCRIT: 36 % (ref 36.0–46.0)
Hemoglobin: 12.2 g/dL (ref 12.0–15.0)
POTASSIUM: 3.8 mmol/L (ref 3.5–5.1)
SODIUM: 139 mmol/L (ref 135–145)
TCO2: 25 mmol/L (ref 22–32)

## 2018-05-31 LAB — BLOOD GAS, VENOUS
ACID-BASE DEFICIT: 0.8 mmol/L (ref 0.0–2.0)
BICARBONATE: 23.3 mmol/L (ref 20.0–28.0)
O2 Saturation: 85.2 %
PCO2 VEN: 38.8 mmHg — AB (ref 44.0–60.0)
PH VEN: 7.397 (ref 7.250–7.430)
Patient temperature: 98.6
pO2, Ven: 57 mmHg — ABNORMAL HIGH (ref 32.0–45.0)

## 2018-05-31 MED ORDER — IOPAMIDOL (ISOVUE-300) INJECTION 61%
100.0000 mL | Freq: Once | INTRAVENOUS | Status: AC | PRN
  Administered 2018-05-31: 100 mL via INTRAVENOUS

## 2018-05-31 MED ORDER — SODIUM CHLORIDE (PF) 0.9 % IJ SOLN
INTRAMUSCULAR | Status: AC
  Filled 2018-05-31: qty 50

## 2018-05-31 MED ORDER — IOPAMIDOL (ISOVUE-300) INJECTION 61%
INTRAVENOUS | Status: AC
  Filled 2018-05-31: qty 100

## 2018-05-31 NOTE — ED Triage Notes (Signed)
Pt reports falling on Monday and Tuesday and has been experiencing pain to left torso since. Pt has states pain has "become unbearable" and called EMS.

## 2018-05-31 NOTE — ED Notes (Signed)
Patient transported to CT 

## 2018-05-31 NOTE — ED Notes (Signed)
Bed: WA21 Expected date:  Expected time:  Means of arrival:  Comments: EMS flank pain and falls

## 2018-05-31 NOTE — ED Notes (Signed)
Patient transported to X-ray 

## 2018-05-31 NOTE — ED Provider Notes (Signed)
57 year old female received at sign out from [place] pending imaging and labs. Per her HPI: .  "Patient is a 57 year old female with a history of Galbraith syndrome, COPD, chronic back pain, hypertension, and depression presenting for injuries after a fall.  Patient reports that she fell 2 days ago out of bed.  Patient reports that she rolled out of bed, she does not know if there is loss of consciousness.  Patient does not know how long she laid there.  Patient reports subsequently, she has had difficulty with ambulation and with pain all down her left side.  Patient denies vomiting, visual disturbance, retrograde amnesia, or increase in weakness or numbness in extremities above her baseline.  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  Patient takes 81 mg of aspirin daily, but does not take any other blood thinners.  Patient reports prior to the emergency department visit today, she was unable to get up due to the pain in her left flank.  Patient also reports that she has had epigastric pain for the past couple of days.  She denies that it is acutely bothersome, but it did precede the fall 2 days ago."  Physical Exam  BP (!) 140/95   Pulse 91   Temp 97.8 F (36.6 C) (Oral)   Resp 13   Wt 69.4 kg   LMP 02/22/2006   SpO2 96%   BMI 24.69 kg/m   Physical Exam  Drowsy and somnolent, answers questions appropriately.  Lips appear dry.  No acute distress.  ED Course/Procedures   Clinical Course as of Jun 01 441  Wed May 31, 2018  2254 Stable and improved.   Hemoglobin(!): 10.8 [AM]  Thu Jun 01, 2018  0043 Consulted with Dr. Blaine Hamper. He will follow up the labwork. Will admit for debilitation, possible pancreatic ductal stone, and new fractures.    [AM]    Clinical Course User Index [AM] Albesa Seen, PA-C    Procedures  MDM   57 year old female received at signout from [place] pending labs and imaging.  Patient is here for a fall out of bed that occurred days ago and is also  endorsing some upper abdominal pain.  On exam, she is somnolent, but answers questions appropriately.  She denies substance use or alcohol use.  Denies any sedating medications at home, but may have taken an Ativan at home?  UDS is positive for benzodiazepines and THC.  Ethanol level is negative.  Blood gas with pH of 7.397.  Imaging with acute minimally angulated fracture of the left anterior fifth rib.  Several subacute left rib fractures are also noted.  No pneumothorax.  There is also an acute fracture of the T7 spinous process and progression of a T8 pression fracture.  There also appears to be an age-indeterminate L2, pression fracture that was not seen on previous CT.  There is also an 8 mm calcific focus in the region of the pancreatic neck concerning for an occlusive stone within the duct.  Further evaluation with MRCP is recommended.  Lipase has been ordered and is pending.  Alk phos is elevated, but minimally elevated compared to previous.  This could also be related to new fractures.  Discussed the patient with Dr. Blaine Hamper, attending physician.  He will accept the patient for admission for further work-up and evaluation.  He recommended consult with neurosurgery.  Neurosurgery consult was initially placed, but after Dr. Blaine Hamper evaluated the patient, he recommended that the hospitalist team would follow-up with neurosurgery in  the morning. The patient appears reasonably stabilized for admission considering the current resources, flow, and capabilities available in the ED at this time, and I doubt any other [facility] requiring further screening and/or treatment in the ED prior to admission.      Joanne Gavel, PA-C 06/01/18 0442    Shanon Rosser, MD 06/01/18 [phone]

## 2018-06-01 ENCOUNTER — Observation Stay (HOSPITAL_COMMUNITY): Payer: Medicaid Other

## 2018-06-01 DIAGNOSIS — S32000A Wedge compression fracture of unspecified lumbar vertebra, initial encounter for closed fracture: Secondary | ICD-10-CM

## 2018-06-01 DIAGNOSIS — F1010 Alcohol abuse, uncomplicated: Secondary | ICD-10-CM | POA: Diagnosis present

## 2018-06-01 DIAGNOSIS — F329 Major depressive disorder, single episode, unspecified: Secondary | ICD-10-CM | POA: Diagnosis present

## 2018-06-01 DIAGNOSIS — K851 Biliary acute pancreatitis without necrosis or infection: Secondary | ICD-10-CM | POA: Diagnosis present

## 2018-06-01 DIAGNOSIS — S32020A Wedge compression fracture of second lumbar vertebra, initial encounter for closed fracture: Secondary | ICD-10-CM | POA: Diagnosis not present

## 2018-06-01 DIAGNOSIS — Z833 Family history of diabetes mellitus: Secondary | ICD-10-CM | POA: Diagnosis not present

## 2018-06-01 DIAGNOSIS — R0781 Pleurodynia: Secondary | ICD-10-CM | POA: Diagnosis not present

## 2018-06-01 DIAGNOSIS — Z841 Family history of disorders of kidney and ureter: Secondary | ICD-10-CM | POA: Diagnosis not present

## 2018-06-01 DIAGNOSIS — W06XXXA Fall from bed, initial encounter: Secondary | ICD-10-CM | POA: Diagnosis present

## 2018-06-01 DIAGNOSIS — G8929 Other chronic pain: Secondary | ICD-10-CM | POA: Diagnosis present

## 2018-06-01 DIAGNOSIS — J449 Chronic obstructive pulmonary disease, unspecified: Secondary | ICD-10-CM | POA: Diagnosis present

## 2018-06-01 DIAGNOSIS — G9341 Metabolic encephalopathy: Secondary | ICD-10-CM

## 2018-06-01 DIAGNOSIS — S2232XA Fracture of one rib, left side, initial encounter for closed fracture: Secondary | ICD-10-CM | POA: Diagnosis not present

## 2018-06-01 DIAGNOSIS — R400 Somnolence: Secondary | ICD-10-CM | POA: Diagnosis not present

## 2018-06-01 DIAGNOSIS — S22008A Other fracture of unspecified thoracic vertebra, initial encounter for closed fracture: Secondary | ICD-10-CM

## 2018-06-01 DIAGNOSIS — F17210 Nicotine dependence, cigarettes, uncomplicated: Secondary | ICD-10-CM | POA: Diagnosis present

## 2018-06-01 DIAGNOSIS — K859 Acute pancreatitis without necrosis or infection, unspecified: Secondary | ICD-10-CM | POA: Diagnosis present

## 2018-06-01 DIAGNOSIS — Z915 Personal history of self-harm: Secondary | ICD-10-CM | POA: Diagnosis not present

## 2018-06-01 DIAGNOSIS — S22060A Wedge compression fracture of T7-T8 vertebra, initial encounter for closed fracture: Secondary | ICD-10-CM

## 2018-06-01 DIAGNOSIS — K219 Gastro-esophageal reflux disease without esophagitis: Secondary | ICD-10-CM | POA: Diagnosis present

## 2018-06-01 DIAGNOSIS — I10 Essential (primary) hypertension: Secondary | ICD-10-CM | POA: Diagnosis present

## 2018-06-01 DIAGNOSIS — Z825 Family history of asthma and other chronic lower respiratory diseases: Secondary | ICD-10-CM | POA: Diagnosis not present

## 2018-06-01 DIAGNOSIS — R0789 Other chest pain: Secondary | ICD-10-CM | POA: Insufficient documentation

## 2018-06-01 DIAGNOSIS — K8050 Calculus of bile duct without cholangitis or cholecystitis without obstruction: Secondary | ICD-10-CM | POA: Diagnosis present

## 2018-06-01 DIAGNOSIS — S2239XA Fracture of one rib, unspecified side, initial encounter for closed fracture: Secondary | ICD-10-CM

## 2018-06-01 DIAGNOSIS — Z803 Family history of malignant neoplasm of breast: Secondary | ICD-10-CM | POA: Diagnosis not present

## 2018-06-01 DIAGNOSIS — Y92009 Unspecified place in unspecified non-institutional (private) residence as the place of occurrence of the external cause: Secondary | ICD-10-CM | POA: Diagnosis not present

## 2018-06-01 DIAGNOSIS — G610 Guillain-Barre syndrome: Secondary | ICD-10-CM

## 2018-06-01 DIAGNOSIS — W19XXXA Unspecified fall, initial encounter: Secondary | ICD-10-CM

## 2018-06-01 DIAGNOSIS — M4856XA Collapsed vertebra, not elsewhere classified, lumbar region, initial encounter for fracture: Secondary | ICD-10-CM | POA: Diagnosis present

## 2018-06-01 DIAGNOSIS — R748 Abnormal levels of other serum enzymes: Secondary | ICD-10-CM | POA: Diagnosis present

## 2018-06-01 DIAGNOSIS — Z8249 Family history of ischemic heart disease and other diseases of the circulatory system: Secondary | ICD-10-CM | POA: Diagnosis not present

## 2018-06-01 DIAGNOSIS — S22020A Wedge compression fracture of second thoracic vertebra, initial encounter for closed fracture: Secondary | ICD-10-CM | POA: Diagnosis not present

## 2018-06-01 DIAGNOSIS — K861 Other chronic pancreatitis: Secondary | ICD-10-CM | POA: Diagnosis present

## 2018-06-01 DIAGNOSIS — M4854XA Collapsed vertebra, not elsewhere classified, thoracic region, initial encounter for fracture: Secondary | ICD-10-CM | POA: Diagnosis not present

## 2018-06-01 DIAGNOSIS — R109 Unspecified abdominal pain: Secondary | ICD-10-CM

## 2018-06-01 DIAGNOSIS — F40240 Claustrophobia: Secondary | ICD-10-CM | POA: Diagnosis present

## 2018-06-01 DIAGNOSIS — S22000A Wedge compression fracture of unspecified thoracic vertebra, initial encounter for closed fracture: Secondary | ICD-10-CM

## 2018-06-01 DIAGNOSIS — Z8781 Personal history of (healed) traumatic fracture: Secondary | ICD-10-CM

## 2018-06-01 DIAGNOSIS — R296 Repeated falls: Secondary | ICD-10-CM | POA: Diagnosis present

## 2018-06-01 DIAGNOSIS — F419 Anxiety disorder, unspecified: Secondary | ICD-10-CM | POA: Diagnosis present

## 2018-06-01 LAB — LIPASE, BLOOD: LIPASE: 30 U/L (ref 11–51)

## 2018-06-01 LAB — URINALYSIS, ROUTINE W REFLEX MICROSCOPIC
Bilirubin Urine: NEGATIVE
GLUCOSE, UA: NEGATIVE mg/dL
Hgb urine dipstick: NEGATIVE
Ketones, ur: 5 mg/dL — AB
Leukocytes, UA: NEGATIVE
Nitrite: NEGATIVE
Protein, ur: NEGATIVE mg/dL
pH: 6 (ref 5.0–8.0)

## 2018-06-01 LAB — BASIC METABOLIC PANEL
Anion gap: 8 (ref 5–15)
BUN: 8 mg/dL (ref 6–20)
CO2: 21 mmol/L — ABNORMAL LOW (ref 22–32)
Calcium: 7.7 mg/dL — ABNORMAL LOW (ref 8.9–10.3)
Chloride: 112 mmol/L — ABNORMAL HIGH (ref 98–111)
Creatinine, Ser: 0.61 mg/dL (ref 0.44–1.00)
GFR calc Af Amer: >60 mL/min (ref >60)
GFR calc non Af Amer: >60 mL/min (ref >60)
Glucose, Bld: 102 mg/dL — ABNORMAL HIGH (ref 70–99)
Potassium: 3.3 mmol/L — ABNORMAL LOW (ref 3.5–5.1)
Sodium: 141 mmol/L (ref 135–145)

## 2018-06-01 LAB — RAPID URINE DRUG SCREEN, HOSP PERFORMED
Amphetamines: NOT DETECTED
Barbiturates: NOT DETECTED
Benzodiazepines: POSITIVE — AB
Cocaine: NOT DETECTED
OPIATES: NOT DETECTED
Tetrahydrocannabinol: POSITIVE — AB

## 2018-06-01 LAB — APTT: aPTT: 30 seconds (ref 24–36)

## 2018-06-01 LAB — CBC
HEMATOCRIT: 33.5 % — AB (ref 36.0–46.0)
Hemoglobin: 10.6 g/dL — ABNORMAL LOW (ref 12.0–15.0)
MCH: 31.4 pg (ref 26.0–34.0)
MCHC: 31.6 g/dL (ref 30.0–36.0)
MCV: 99.1 fL (ref 80.0–100.0)
Platelets: 250 10*3/uL (ref 150–400)
RBC: 3.38 MIL/uL — ABNORMAL LOW (ref 3.87–5.11)
RDW: 14.1 % (ref 11.5–15.5)
WBC: 5.4 10*3/uL (ref 4.0–10.5)
nRBC: 0 % (ref 0.0–0.2)

## 2018-06-01 LAB — PROTIME-INR
INR: 0.89
Prothrombin Time: 12 seconds (ref 11.4–15.2)

## 2018-06-01 LAB — AMMONIA: Ammonia: 24 umol/L (ref 9–35)

## 2018-06-01 LAB — ETHANOL: Alcohol, Ethyl (B): <10 mg/dL (ref <10)

## 2018-06-01 MED ORDER — LORAZEPAM 2 MG/ML IJ SOLN: 0.0000 mg | Freq: Two times a day (BID) | INTRAMUSCULAR | Status: DC

## 2018-06-01 MED ORDER — SODIUM CHLORIDE 0.9 % IV SOLN: Freq: Once | INTRAVENOUS | Status: DC

## 2018-06-01 MED ORDER — QUETIAPINE FUMARATE 100 MG PO TABS
400.0000 mg | ORAL_TABLET | Freq: Every day | ORAL | Status: DC
  Administered 2018-06-01: 400 mg via ORAL
  Filled 2018-06-01: qty 4

## 2018-06-01 MED ORDER — SODIUM CHLORIDE 0.9 % IV SOLN
INTRAVENOUS | Status: DC
  Administered 2018-06-01 – 2018-06-02 (×3): via INTRAVENOUS

## 2018-06-01 MED ORDER — ACETAMINOPHEN 325 MG PO TABS: 650.0000 mg | ORAL_TABLET | Freq: Four times a day (QID) | ORAL | Status: DC | PRN

## 2018-06-01 MED ORDER — LORAZEPAM 2 MG/ML IJ SOLN
0.0000 mg | Freq: Four times a day (QID) | INTRAMUSCULAR | Status: DC
  Filled 2018-06-01: qty 1

## 2018-06-01 MED ORDER — SODIUM CHLORIDE 0.9 % IV BOLUS
1000.0000 mL | Freq: Once | INTRAVENOUS | Status: AC
  Administered 2018-06-01: 1000 mL via INTRAVENOUS

## 2018-06-01 MED ORDER — ADULT MULTIVITAMIN W/MINERALS CH
1.0000 | ORAL_TABLET | Freq: Every day | ORAL | Status: DC
  Administered 2018-06-01 – 2018-06-02 (×2): 1 via ORAL
  Filled 2018-06-01 (×2): qty 1

## 2018-06-01 MED ORDER — MORPHINE SULFATE (PF) 2 MG/ML IV SOLN
1.0000 mg | INTRAVENOUS | Status: DC | PRN
  Administered 2018-06-01 (×2): 1 mg via INTRAVENOUS
  Filled 2018-06-01 (×2): qty 1

## 2018-06-01 MED ORDER — ASPIRIN EC 81 MG PO TBEC
81.0000 mg | DELAYED_RELEASE_TABLET | Freq: Every day | ORAL | Status: DC
  Administered 2018-06-01 – 2018-06-02 (×2): 81 mg via ORAL
  Filled 2018-06-01 (×2): qty 1

## 2018-06-01 MED ORDER — OXYCODONE HCL 5 MG PO TABS
5.0000 mg | ORAL_TABLET | Freq: Four times a day (QID) | ORAL | Status: DC | PRN
  Administered 2018-06-01 – 2018-06-02 (×2): 5 mg via ORAL
  Filled 2018-06-01 (×2): qty 1

## 2018-06-01 MED ORDER — FOLIC ACID 1 MG PO TABS
1.0000 mg | ORAL_TABLET | Freq: Every day | ORAL | Status: DC
  Administered 2018-06-01 – 2018-06-02 (×2): 1 mg via ORAL
  Filled 2018-06-01 (×2): qty 1

## 2018-06-01 MED ORDER — ESOMEPRAZOLE MAGNESIUM 40 MG PO CPDR
40.0000 mg | DELAYED_RELEASE_CAPSULE | Freq: Every day | ORAL | Status: DC
  Administered 2018-06-01 – 2018-06-02 (×2): 40 mg via ORAL
  Filled 2018-06-01 (×2): qty 1

## 2018-06-01 MED ORDER — ENOXAPARIN SODIUM 40 MG/0.4ML ~~LOC~~ SOLN
40.0000 mg | SUBCUTANEOUS | Status: DC
  Administered 2018-06-02: 40 mg via SUBCUTANEOUS
  Filled 2018-06-01 (×2): qty 0.4

## 2018-06-01 MED ORDER — ALBUTEROL SULFATE (2.5 MG/3ML) 0.083% IN NEBU: 2.5000 mg | INHALATION_SOLUTION | RESPIRATORY_TRACT | Status: DC | PRN

## 2018-06-01 MED ORDER — LIDOCAINE 5 % EX PTCH
1.0000 | MEDICATED_PATCH | CUTANEOUS | Status: DC
  Administered 2018-06-01 (×2): 1 via TRANSDERMAL
  Filled 2018-06-01 (×2): qty 1

## 2018-06-01 MED ORDER — LORAZEPAM 1 MG PO TABS
1.0000 mg | ORAL_TABLET | Freq: Four times a day (QID) | ORAL | Status: DC | PRN
  Administered 2018-06-01: 1 mg via ORAL
  Filled 2018-06-01 (×2): qty 1

## 2018-06-01 MED ORDER — ONDANSETRON HCL 4 MG/2ML IJ SOLN: 4.0000 mg | Freq: Four times a day (QID) | INTRAMUSCULAR | Status: DC | PRN

## 2018-06-01 MED ORDER — THIAMINE HCL 100 MG/ML IJ SOLN: 100.0000 mg | Freq: Every day | INTRAMUSCULAR | Status: DC

## 2018-06-01 MED ORDER — LINACLOTIDE 145 MCG PO CAPS
290.0000 ug | ORAL_CAPSULE | Freq: Every day | ORAL | Status: DC
  Administered 2018-06-01 – 2018-06-02 (×2): 290 ug via ORAL
  Filled 2018-06-01 (×2): qty 2

## 2018-06-01 MED ORDER — VITAMIN B-1 100 MG PO TABS
100.0000 mg | ORAL_TABLET | Freq: Every day | ORAL | Status: DC
  Administered 2018-06-01 – 2018-06-02 (×2): 100 mg via ORAL
  Filled 2018-06-01 (×2): qty 1

## 2018-06-01 MED ORDER — GADOBUTROL 1 MMOL/ML IV SOLN: 6.0000 mL | Freq: Once | INTRAVENOUS | Status: DC | PRN

## 2018-06-01 MED ORDER — IBUPROFEN 200 MG PO TABS
400.0000 mg | ORAL_TABLET | Freq: Four times a day (QID) | ORAL | Status: DC | PRN
  Administered 2018-06-02: 400 mg via ORAL
  Filled 2018-06-01: qty 2

## 2018-06-01 MED ORDER — NICOTINE 21 MG/24HR TD PT24
21.0000 mg | MEDICATED_PATCH | Freq: Every day | TRANSDERMAL | Status: DC
  Filled 2018-06-01 (×2): qty 1

## 2018-06-01 MED ORDER — ONDANSETRON HCL 4 MG PO TABS: 4.0000 mg | ORAL_TABLET | Freq: Four times a day (QID) | ORAL | Status: DC | PRN

## 2018-06-01 MED ORDER — ACETAMINOPHEN 650 MG RE SUPP: 650.0000 mg | Freq: Four times a day (QID) | RECTAL | Status: DC | PRN

## 2018-06-01 MED ORDER — HYDRALAZINE HCL 20 MG/ML IJ SOLN: 5.0000 mg | INTRAMUSCULAR | Status: DC | PRN

## 2018-06-01 MED ORDER — METHOCARBAMOL 500 MG PO TABS
500.0000 mg | ORAL_TABLET | Freq: Three times a day (TID) | ORAL | Status: DC | PRN
  Administered 2018-06-02: 500 mg via ORAL
  Filled 2018-06-01: qty 1

## 2018-06-01 MED ORDER — LORAZEPAM 2 MG/ML IJ SOLN
1.0000 mg | Freq: Four times a day (QID) | INTRAMUSCULAR | Status: DC | PRN
  Administered 2018-06-01: 1 mg via INTRAVENOUS

## 2018-06-01 MED ORDER — SENNOSIDES-DOCUSATE SODIUM 8.6-50 MG PO TABS: 1.0000 | ORAL_TABLET | Freq: Every evening | ORAL | Status: DC | PRN

## 2018-06-01 MED ORDER — DULOXETINE HCL 30 MG PO CPEP
30.0000 mg | ORAL_CAPSULE | Freq: Every day | ORAL | Status: DC
  Administered 2018-06-01 – 2018-06-02 (×2): 30 mg via ORAL
  Filled 2018-06-01 (×2): qty 1

## 2018-06-01 NOTE — Discharge Instructions (Signed)
Please see the information and instructions below regarding your visit.  Your diagnoses today include:  1. Fall, initial encounter   2. Rib pain on left side   3. Left arm pain   4. Elevated alkaline phosphatase level     Tests performed today include: See side panel of your discharge paperwork for testing performed today. Vital signs are listed at the bottom of these instructions.   Medications prescribed:    Take any prescribed medications only as prescribed, and any over the counter medications only as directed on the packaging.  Continue your home gabapentin.   Home care instructions:  Please follow any educational materials contained in this packet.   Follow-up instructions: Please follow-up with your primary care provider in 7-10 for further evaluation of your symptoms if they are not completely improved.   Please follow up with physical therapy. They will call you.   Return instructions:  Please return to the Emergency Department if you experience worsening symptoms.  Please return to the emergency department if any worsening pain, inability to walk, weakness or numbness in extremities that is worse in your baseline, splinting or pain with breathing.  Please return if you have any other emergent concerns.  Additional Information:   Your vital signs today were: BP (!) 126/91 (BP Location: Right Arm)    Pulse 96    Temp 97.8 F (36.6 C) (Oral)    Resp 15    Wt 69.4 kg    LMP 02/22/2006    SpO2 96%    BMI 24.69 kg/m  If your blood pressure (BP) was elevated on multiple readings during this visit above 130 for the top number or above 80 for the bottom number, please have this repeated by your primary care provider within one month. --------------  Thank you for allowing us to participate in your care today.

## 2018-06-01 NOTE — Evaluation (Signed)
Occupational Therapy Evaluation Patient Details Name: Catherine Butler MRN: [id number] DOB: 1961-04-11 Today's Date: 06/01/2018    History of Present Illness 57 y.o. female with medical history significant of COPD, DM, EtOH abuse, T11 endplate fracture, ongoing tobacco use, anxiety, presents to the hospital 57 year old woman PMH alcohol abuse, Gillain-Barre syndrome, chronic pancreatitis, presented after a fall 2 to 4 days ago out of bed resulting in left-sided flank pain and back pain..  Acute fracture T7 spinous process, progression of T8 compression fracture, age-indeterminate L2 compression fracture.   Clinical Impression   Pt was admitted for the above. She is known to this OT from previous admissions, including when she had [place].  Pt has been functioning at a mod I level at home and helping her parents.  She reports that she fell out of bed twice.  Will follow in acute with min guard level goals.     Follow Up Recommendations  SNF;Supervision/Assistance - 24 hour   Pt had a bad experience at a SNF in the past; is not agreeable to this plan   Equipment Recommendations  (to be further assessed)    Recommendations for Other Services       Precautions / Restrictions Precautions Precautions: Fall Precaution Comments: multiple spine compression fractures.  No brace required Restrictions Weight Bearing Restrictions: No      Mobility Bed Mobility Overal bed mobility: Needs Assistance Bed Mobility: Sit to Sidelying         Sit to sidelying: Mod assist General bed mobility comments: assist with legs, cues for back protection  Transfers Overall transfer level: Needs assistance   Transfers: Sit to/from Stand;Stand Pivot Transfers Sit to Stand: Mod assist;+2 safety/equipment Stand pivot transfers: Min assist;Mod assist;+2 safety/equipment       General transfer comment: needed more assistance from [facility] than to it    Balance                                            ADL either performed or assessed with clinical judgement   ADL Overall ADL's : Needs assistance/impaired Eating/Feeding: Independent   Grooming: Set up   Upper Body Bathing: Set up   Lower Body Bathing: Moderate assistance   Upper Body Dressing : Minimal assistance   Lower Body Dressing: Maximal assistance   Toilet Transfer: Moderate assistance;+2 for safety/equipment;Stand-pivot;BSC;RW   Toileting- Clothing Manipulation and Hygiene: Minimal assistance;Sit to/from stand         General ADL Comments: pt's adls limited by pain at this time. She has extreme pain with coughing and was very unsteady with transfer back to bed     Vision         Perception     Praxis      Pertinent Vitals/Pain Pain Assessment: 0-10 Pain Score: 10-Worst pain ever Pain Location: back and left side Pain Descriptors / Indicators: Aching;Burning;Cramping;Discomfort Pain Intervention(s): Limited activity within patient's tolerance;Monitored during session;Repositioned     Hand Dominance     Extremity/Trunk Assessment Upper Extremity Assessment Upper Extremity Assessment: Generalized weakness(has residual decreased FMC)      Cervical / Trunk Assessment Cervical / Trunk Assessment: Other exceptions Cervical / Trunk Exceptions: does not sir straight, guarding   Communication Communication Communication: No difficulties   Cognition Arousal/Alertness: Awake/alert Behavior During Therapy: WFL for tasks assessed/performed Overall Cognitive Status: Within Functional Limits for tasks assessed  General Comments       Exercises     Shoulder Instructions      Home Living Family/patient expects to be discharged to:: Private residence Living Arrangements: Parent Available Help at Discharge: Family Type of Home: House Home Access: Stairs to enter Secretary/administratorntrance Stairs-Number of Steps: 3 Entrance Stairs-Rails: None Home  Layout: One level     Bathroom Shower/Tub: Chief Strategy OfficerTub/shower unit   Bathroom Toilet: Standard     Home Equipment: Environmental consultantWalker - 2 wheels          Prior Functioning/Environment Level of Independence: Independent with assistive device(s)        Comments: used RW prn.  Helped her parents        OT Problem List: Decreased strength;Decreased activity tolerance;Impaired balance (sitting and/or standing);Decreased knowledge of use of DME or AE;Pain;Decreased coordination      OT Treatment/Interventions: Self-care/ADL training;DME and/or AE instruction;Patient/family education;Balance training;Therapeutic activities    OT Goals(Current goals can be found in the care plan section) Acute Rehab OT Goals Patient Stated Goal: to go home, walk OT Goal Formulation: With patient Time For Goal Achievement: 06/15/18 Potential to Achieve Goals: Good ADL Goals Pt Will Transfer to Toilet: with min guard assist;ambulating;bedside commode(and toileting) Additional ADL Goal #1: pt will perform adl with AE as needed at min guard  level  OT Frequency: Min 2X/week   Barriers to D/C:            Co-evaluation PT/OT/SLP Co-Evaluation/Treatment: Yes Reason for Co-Treatment: For patient/therapist safety PT goals addressed during session: Mobility/safety with mobility OT goals addressed during session: ADL's and self-care      AM-PAC OT "6 Clicks" Daily Activity     Outcome Measure Help from another person eating meals?: None Help from another person taking care of personal grooming?: A Little Help from another person toileting, which includes using toliet, bedpan, or urinal?: A Lot Help from another person bathing (including washing, rinsing, drying)?: A Lot Help from another person to put on and taking off regular upper body clothing?: A Little Help from another person to put on and taking off regular lower body clothing?: A Lot 6 Click Score: 16   End of Session    Activity Tolerance: Patient  limited by pain Patient left: in bed;with call bell/phone within reach  OT Visit Diagnosis: Unsteadiness on feet (R26.81)                Time: 1313-1330 OT Time Calculation (min): 17 min Charges:  OT General Charges $OT Visit: 1 Visit OT Evaluation $OT Eval Low Complexity: 1 Low  Catherine Butler, OTR/L Acute Rehabilitation Services [phone] [facility] pager [phone] office 06/01/2018  Catherine Butler 06/01/2018, 3:19 PM

## 2018-06-01 NOTE — Evaluation (Signed)
Physical Therapy Evaluation Patient Details Name: Catherine Butler D Wolfman MRN: [id number] DOB: 05/10/1961 Today's Date: 06/01/2018   History of Present Illness  57 y.o. female with medical history significant of COPD, DM, EtOH abuse, T11 endplate fracture, ongoing tobacco use, anxiety, presents to the hospital 57 year old woman PMH alcohol abuse, Gillain-Barre syndrome, chronic pancreatitis, presented after a fall 2 to 4 days ago out of bed resulting in left-sided flank pain and back pain..  Acute fracture T7 spinous process, progression of T8 compression fracture, age-indeterminate L2 compression fracture.  Clinical Impression  The patient reports 10/10 pain in back. [facility]. Moves extremely slow and guarded. Pt admitted with above diagnosis. Pt currently with functional limitations due to the deficits listed below (see PT Problem List). Pt will benefit from skilled PT to increase their independence and safety with mobility to allow discharge to the venue listed below.       Follow Up Recommendations Home health PT;SNF(pt. declines SNF)    Equipment Recommendations  None recommended by PT    Recommendations for Other Services       Precautions / Restrictions Precautions Precautions: Fall Precaution Comments: multiple spine compression fractures      Mobility  Bed Mobility Overal bed mobility: Needs Assistance Bed Mobility: Sit to Sidelying         Sit to sidelying: Mod assist General bed mobility comments: assist with legs, cues for back protection  Transfers Overall transfer level: Needs assistance   Transfers: Sit to/from Stand;Stand Pivot Transfers Sit to Stand: Mod assist;+2 safety/equipment Stand pivot transfers: +2 safety/equipment;Mod assist       General transfer comment: steady assist to rise and step to recliner. then to gurney.   Ambulation/Gait                Stairs            Wheelchair Mobility    Modified Rankin  (Stroke Patients Only)       Balance                                             Pertinent Vitals/Pain Pain Assessment: 0-10 Pain Score: 10-Worst pain ever Pain Location: back and left side Pain Descriptors / Indicators: Aching;Burning;Cramping;Discomfort Pain Intervention(s): Limited activity within patient's tolerance;Monitored during session;Premedicated before session;Repositioned;Patient requesting pain meds-RN notified    Home Living Family/patient expects to be discharged to:: Private residence Living Arrangements: Parent(was caring for parents) Available Help at Discharge: Family Type of Home: House Home Access: Stairs to enter Entrance Stairs-Rails: None Secretary/administratorntrance Stairs-Number of Steps: 3 Home Layout: One level Home Equipment: Environmental consultantWalker - 2 wheels      Prior Function Level of Independence: Independent with assistive device(s)         Comments: used RW prn     Hand Dominance        Extremity/Trunk Assessment        Lower Extremity Assessment Lower Extremity Assessment: Generalized weakness    Cervical / Trunk Assessment Cervical / Trunk Assessment: Other exceptions Cervical / Trunk Exceptions: does not sir straight, guarding  Communication   Communication: No difficulties  Cognition Arousal/Alertness: Awake/alert Behavior During Therapy: WFL for tasks assessed/performed Overall Cognitive Status: Within Functional Limits for tasks assessed  General Comments      Exercises     Assessment/Plan    PT Assessment Patient needs continued PT services  PT Problem List Decreased strength;Decreased activity tolerance;Decreased safety awareness;Decreased balance;Decreased mobility;Pain       PT Treatment Interventions DME instruction;Gait training;Functional mobility training;Therapeutic activities;Stair training;Patient/family education    PT Goals (Current goals can be found  in the Care Plan section)  Acute Rehab PT Goals Patient Stated Goal: to go home, walk PT Goal Formulation: With patient Time For Goal Achievement: 06/15/18 Potential to Achieve Goals: Fair    Frequency Min 3X/week   Barriers to discharge Decreased caregiver support      Co-evaluation PT/OT/SLP Co-Evaluation/Treatment: Yes Reason for Co-Treatment: For patient/therapist safety PT goals addressed during session: Mobility/safety with mobility OT goals addressed during session: ADL's and self-care       AM-PAC PT "6 Clicks" Mobility  Outcome Measure Help needed turning from your back to your side while in a flat bed without using bedrails?: A Lot Help needed moving from lying on your back to sitting on the side of a flat bed without using bedrails?: A Lot Help needed moving to and from a bed to a chair (including a wheelchair)?: Total Help needed standing up from a chair using your arms (e.g., wheelchair or bedside chair)?: Total Help needed to walk in hospital room?: Total Help needed climbing 3-5 steps with a railing? : Total 6 Click Score: 8    End of Session   Activity Tolerance: Patient limited by pain Patient left: in bed Nurse Communication: Mobility status PT Visit Diagnosis: Unsteadiness on feet (R26.81)    Time: 1313-1330 PT Time Calculation (min) (ACUTE ONLY): 17 min   Charges:   PT Evaluation $PT Eval Low Complexity: 1 Low          Blanchard Kelch PT Acute Rehabilitation Services Pager [phone] Office [phone]   Rada Hay 06/01/2018, 2:08 PM

## 2018-06-01 NOTE — ED Notes (Signed)
Per Irene LimboGoodrich MD: Hold on MRI bc we do not have an open MRI and we do not want to administer more ativan at this time.

## 2018-06-01 NOTE — ED Provider Notes (Addendum)
[facility]-EMERGENCY DEPT Provider Note   CSN: [id number] Arrival date & time: 05/31/18  2043     History   Chief Complaint Chief Complaint  Patient presents with  . Fall    HPI Catherine Butler is a 57 y.o. female.  HPI  Patient is a 57 year old female with a history of Guillain Barre syndrome, COPD, chronic back pain, hypertension, and depression presenting for injuries after a fall.  Patient reports that she fell 2 days ago out of bed.  Patient reports that she rolled out of bed, she does not know if there is loss of consciousness.  Patient does not know how long she laid there.  Patient reports subsequently, she has had difficulty with ambulation and with pain all down her left side.  Patient denies vomiting, visual disturbance, retrograde amnesia, or increase in weakness or numbness in extremities above her baseline.  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  Patient takes 81 mg of aspirin daily, but does not take any other blood thinners.  Patient reports prior to the emergency department visit today, she was unable to get up due to the pain in her left flank.  Patient also reports that she has had epigastric pain for the past couple of days.  She denies that it is acutely bothersome, but it did precede the fall 2 days ago.  Past Medical History:  Diagnosis Date  . Anxiety   . Arthritis   . Asthma   . Complication of anesthesia    nausea  . COPD (chronic obstructive pulmonary disease) (HCC)   . Depression   . Guillain-Barre (HCC) 2018  . H/O alcohol abuse   . Hypertension    pt denies  . PONV (postoperative nausea and vomiting)   . Reflux   . Suicide attempt by multiple drug overdose 2014    Patient Active Problem List   Diagnosis Date Noted  . Fall 06/01/2018  . Cough 04/25/2018  . CAP (community acquired pneumonia) 04/10/2018  . Protein-calorie malnutrition, severe 07/21/2017  . Colonic mass 07/18/2017  . Dehydration 07/18/2017  . Hypotension  07/18/2017  . Hypomagnesemia 07/18/2017  . History of prolonged Q-T interval on ECG 07/06/2017  . Tachycardia   . Generalized anxiety disorder   . Acute lower UTI   . Debility 06/23/2017  . Constipation   . Quadriplegia and quadriparesis (HCC)   . Guillain Barr syndrome (HCC)   . Nausea & vomiting 06/21/2017  . Hypokalemia 06/21/2017  . Volume depletion 06/21/2017  . Sinus tachycardia 06/21/2017  . Nausea and vomiting 06/21/2017  . Essential hypertension 04/14/2017  . Paresthesia   . 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 Abdominal pain 01/01/2017  . Sepsis (HCC) 01/01/2017  . Tobacco abuse 01/01/2017  . Abnormal LFTs 01/01/2017  . Depression   . COPD (chronic obstructive pulmonary disease) (HCC) 06/16/2015    Past Surgical History:  Procedure Laterality Date  . COLONOSCOPY WITH PROPOFOL N/A 07/21/2017   Procedure: COLONOSCOPY WITH PROPOFOL;  Surgeon: Rachael Fee, MD;  Location: [facility];  Service: Endoscopy;  Laterality: N/A;  . LAPAROSCOPY    . laporscopy surgery for ovarian cyst  20 years ago  . TONSILLECTOMY    . TUBAL LIGATION       OB History    Gravida  3   Para  1   Term  1   Preterm      AB  1   Living  1  SAB  1   TAB      Ectopic      Multiple      Live Births               Home Medications    Prior to Admission medications   Medication Sig Start Date End Date Taking? Authorizing Provider  acetaminophen (TYLENOL) 500 MG tablet Take 1,000 mg by mouth 2 (two) times daily as needed for headache.   Yes [provider]  albuterol (PROVENTIL HFA;VENTOLIN HFA) 108 (90 Base) MCG/ACT inhaler Inhale 2 puffs into the lungs every 6 (six) hours as needed for wheezing. 12/05/17  Yes Loletta Specter, PA-C  aspirin EC 81 MG tablet Take 1 tablet (81 mg total) by mouth daily. 03/28/18  Yes Loletta Specter, PA-C  DULoxetine (CYMBALTA) 30 MG capsule TAKE 1 CAPSULE BY MOUTH ONCE  DAILY Patient taking differently: Take 30 mg by mouth daily after breakfast.  02/08/18  Yes Loletta Specter, PA-C  esomeprazole (NEXIUM) 40 MG packet Take 40 mg by mouth daily before breakfast. 01/11/18  Yes Rachael Fee, MD  gabapentin (NEURONTIN) 600 MG tablet TAKE 2 TABLETS BY MOUTH 3 TIMES DAILY Patient taking differently: Take 1,200 mg by mouth 3 (three) times daily.  03/17/18  Yes Loletta Specter, PA-C  hydrOXYzine (ATARAX/VISTARIL) 25 MG tablet Take 1 tablet (25 mg total) by mouth at bedtime. 02/28/18  Yes Loletta Specter, PA-C  ibuprofen (ADVIL,MOTRIN) 200 MG tablet Take 400 mg by mouth every 6 (six) hours as needed for fever, headache, mild pain, moderate pain or cramping.   Yes [provider]  linaclotide Karlene Einstein) 290 MCG CAPS capsule Take 1 capsule (290 mcg total) by mouth daily before breakfast. 01/11/18  Yes Rachael Fee, MD  QUEtiapine (SEROQUEL) 400 MG tablet Take 400 mg by mouth at bedtime.   Yes [provider]  benzonatate (TESSALON) 200 MG capsule Take 1 capsule (200 mg total) by mouth at bedtime. Patient not taking: Reported on 05/31/2018 04/25/18   Loletta Specter, PA-C  levofloxacin (LEVAQUIN) 500 MG tablet Take 1 tablet (500 mg total) by mouth daily. Patient not taking: Reported on 05/31/2018 04/25/18   Loletta Specter, PA-C  nicotine (NICODERM CQ - DOSED IN MG/24 HOURS) 21 mg/24hr patch Place 1 patch (21 mg total) onto the skin daily. Patient not taking: Reported on 05/31/2018 03/28/18   Loletta Specter, PA-C    Family History Family History  Problem Relation Age of Onset  . Diabetes Father   . Hypertension Father 50       heart attack  . Kidney disease Father   . Heart attack Father   . Heart failure Father   . Dementia Father   . Breast cancer Mother 30       lump removed, bile duct cancer  . COPD Mother   . Hypertension Brother   . Allergic rhinitis Paternal Grandfather     Social History Social History   Tobacco  Use  . Smoking status: Current Every Day Smoker    Packs/day: 1.00    Years: 40.00    Pack years: 40.00    Types: Cigarettes  . Smokeless tobacco: Never Used  Substance Use Topics  . Alcohol use: Yes    Frequency: Never    Comment: 1-2 glasses a month  . Drug use: No     Allergies   Influenza vaccines and Lipitor [atorvastatin]   Review of Systems Review of Systems  Constitutional:  Positive for activity change.  Respiratory: Negative for shortness of breath.   Gastrointestinal: Positive for abdominal pain. Negative for diarrhea, nausea and vomiting.  Musculoskeletal: Positive for arthralgias, back pain and myalgias. Negative for neck pain and neck stiffness.  Skin: Positive for color change and wound.  Neurological: Positive for weakness and numbness. Negative for dizziness and headaches.  All other systems reviewed and are negative.    Physical Exam Updated Vital Signs BP (!) 126/91 (BP Location: Right Arm)   Pulse 96   Temp 97.8 F (36.6 C) (Oral)   Resp 15   Wt 69.4 kg   LMP 02/22/2006   SpO2 96%   BMI 24.69 kg/m   Physical Exam  Constitutional: She appears well-developed and well-nourished.  Appears somnolent and has to be awakened for questions.   HENT:  Head: Normocephalic and atraumatic.  Mouth/Throat: Oropharynx is clear and moist.  Eyes: Pupils are equal, round, and reactive to light. Conjunctivae and EOM are normal.  Neck: Normal range of motion. Neck supple.  No midline neck tenderness.  Cardiovascular: Normal rate, regular rhythm, S1 normal and S2 normal.  No murmur heard. Pulmonary/Chest: Effort normal and breath sounds normal. She has no wheezes. She has no rales.  Abdominal: Soft. She exhibits no distension. There is tenderness. There is no guarding.  Epigastric tenderness palpation without guarding or rebound.  Musculoskeletal: Normal range of motion. She exhibits no edema or deformity.  Patient has ecchymosis of the lower left flank.   Patient has point tenderness to palpation over the left lower rib space. Moves all extremities equally.  Neurological: She is alert.  Cranial nerves grossly intact. Patient moves extremities symmetrically and with good coordination.  Skin: Skin is warm and dry. No rash noted. No erythema.  Psychiatric:  Appears somnolent.  Answers questions appropriately.  Nursing note and vitals reviewed.    ED Treatments / Results  Labs (all labs ordered are listed, but only abnormal results are displayed) Labs Reviewed  CBC WITH DIFFERENTIAL/PLATELET - Abnormal; Notable for the following components:      Result Value   RBC 3.38 (*)    Hemoglobin 10.8 (*)    HCT 34.1 (*)    MCV 100.9 (*)    All other components within normal limits  COMPREHENSIVE METABOLIC PANEL - Abnormal; Notable for the following components:   Glucose, Bld 113 (*)    Calcium 8.1 (*)    Total Protein 5.9 (*)    Albumin 2.6 (*)    AST 14 (*)    Alkaline Phosphatase 201 (*)    All other components within normal limits  BLOOD GAS, VENOUS - Abnormal; Notable for the following components:   pCO2, Ven 38.8 (*)    pO2, Ven 57.0 (*)    All other components within normal limits  I-STAT CHEM 8, ED - Abnormal; Notable for the following components:   Glucose, Bld 107 (*)    Calcium, Ion 1.06 (*)    All other components within normal limits  ETHANOL  LIPASE, BLOOD  RAPID URINE DRUG SCREEN, HOSP PERFORMED  AMMONIA  BASIC METABOLIC PANEL  CBC  PROTIME-INR  APTT  URINALYSIS, ROUTINE W REFLEX MICROSCOPIC    EKG None  Radiology Dg Elbow Complete Left  Result Date: 05/31/2018 CLINICAL DATA:  Fall with elbow pain EXAM: LEFT ELBOW - COMPLETE 3+ VIEW COMPARISON:  None. FINDINGS: There is no evidence of fracture, dislocation, or joint effusion. There is no evidence of arthropathy or other focal bone abnormality. Soft tissues  are unremarkable. IMPRESSION: Negative. Electronically Signed   By: Jasmine PangKim  Fujinaga M.D.   On: 05/31/2018  22:36   Ct Head Wo Contrast  Result Date: 05/31/2018 CLINICAL DATA:  Head trauma from falls over the past 2 days. EXAM: CT HEAD WITHOUT CONTRAST TECHNIQUE: Contiguous axial images were obtained from the base of the skull through the vertex without intravenous contrast. COMPARISON:  03/30/2018. FINDINGS: Brain: Stable mildly enlarged subarachnoid spaces. Normal size and position of the ventricles. No intracranial hemorrhage, mass lesion or CT evidence of acute infarction. Vascular: No hyperdense vessel or unexpected calcification. Skull: Normal. Negative for fracture or focal lesion. Sinuses/Orbits: Unremarkable. Other: None. IMPRESSION: No acute abnormality.  Mild peripheral atrophy. Electronically Signed   By: Beckie SaltsSteven  Reid M.D.   On: 05/31/2018 23:58   Ct Chest W Contrast  Result Date: 06/01/2018 CLINICAL DATA:  57 year old female with trauma and left-sided pain. EXAM: CT CHEST, ABDOMEN, AND PELVIS WITH CONTRAST TECHNIQUE: Multidetector CT imaging of the chest, abdomen and pelvis was performed following the standard protocol during bolus administration of intravenous contrast. CONTRAST:  100mL ISOVUE-300 IOPAMIDOL (ISOVUE-300) INJECTION 61% COMPARISON:  Chest CT dated 04/10/2018 FINDINGS: CT CHEST FINDINGS Cardiovascular: There is no cardiomegaly or pericardial effusion. The thoracic aorta is unremarkable. The visualized origins of the great vessels of the aortic arch appear patent. The central pulmonary arteries appear patent. Mediastinum/Nodes: There is no hilar or mediastinal adenopathy. There is a moderate size hiatal hernia. The esophagus and the thyroid gland are grossly unremarkable. No mediastinal fluid collection. Lungs/Pleura: Bilateral lower lobe linear atelectasis/scarring as well as posterior subpleural dependent atelectatic changes noted. A patchy area of linear opacity in the lingula, likely atelectatic changes. Pneumonia, although less likely is not excluded. Clinical correlation is  recommended. There is no pleural effusion or pneumothorax. The central airways are patent. Musculoskeletal: Acute minimally angulated fracture of the left anterior fifth rib. Multiple additional subacute or old fractures involving the lateral aspect of the left ribs including tenth and eleventh rib. Old-appearing left seventh rib fracture. There is progression of T8 compression fracture compared to the prior CT with near complete loss of vertebral body height centrally and mild buckling of the posterior cortex. No retropulsed fragment. T4 compression fracture with approximately 20% loss of vertebral body height centrally similar to prior CT. Similar appearance of T8 superior endplate compression fracture. There is acute fracture of the T7 spinous process. Old healed sternal manubrium fractures. CT ABDOMEN PELVIS FINDINGS No intra-abdominal free air or free fluid. Hepatobiliary: No focal liver abnormality is seen. No gallstones, gallbladder wall thickening, or biliary dilatation. Pancreas: Scattered coarse calcification of the pancreas, likely sequela of chronic pancreatitis. Mild peripancreatic haziness may represent an acute on chronic pancreatitis. Correlation with pancreatic enzymes recommended. There is dilatation of the main pancreatic duct with moderate associated gland atrophy. The pancreatic duct measures up to 6 mm in diameter. There is an 8 mm calcific focus in the region of the pancreatic neck along the course of the pancreatic duct similar to prior CT and concerning for an occlusive stone within the duct. Further evaluation with MRCP recommended. No fluid collection or abscess. Spleen: Normal in size without focal abnormality. Adrenals/Urinary Tract: The adrenal glands are unremarkable. There is no hydronephrosis on either side. There is symmetric enhancement and excretion of contrast by both kidneys. The visualized ureters and urinary bladder appear unremarkable. Stomach/Bowel: Moderate size hiatal  hernia. There is no bowel obstruction or active inflammation. The appendix is normal. Vascular/Lymphatic: Mild aortoiliac atherosclerotic disease. No  portal venous gas. There is no adenopathy. Reproductive: The uterus is grossly unremarkable as visualized. No pelvic mass. Other: None Musculoskeletal: Osteopenia. L2 compression fracture, new since the CT of 07/30/2017. With anterior wedging and approximately 40% loss of vertebral body height anteriorly. A small triangular fragment noted at the anterior superior endplate. IMPRESSION: 1. Acute minimally angulated fracture of the left anterior fifth rib. Acute fracture of the T7 spinous process. Multiple subacute or old left rib fractures. No pneumothorax. 2. Progression of the T8 compression fracture compared to the prior CT. 3. Age indeterminate L2 compression fracture, new since the prior CT. 4. Probable acute on chronic pancreatitis. Correlation with pancreatic enzymes recommended. A 8 mm calcific focus in the region of the pancreatic neck concerning for an occlusive stone within the duct. Further evaluation with MRCP recommended. Electronically Signed   By: Elgie Collard M.D.   On: 06/01/2018 00:10   Ct Abdomen Pelvis W Contrast  Result Date: 06/01/2018 CLINICAL DATA:  57 year old female with trauma and left-sided pain. EXAM: CT CHEST, ABDOMEN, AND PELVIS WITH CONTRAST TECHNIQUE: Multidetector CT imaging of the chest, abdomen and pelvis was performed following the standard protocol during bolus administration of intravenous contrast. CONTRAST:  ISOVUE-300 IOPAMIDOL (ISOVUE-300) INJECTION 61% COMPARISON:  Chest CT dated 04/10/2018 FINDINGS: CT CHEST FINDINGS Cardiovascular: There is no cardiomegaly or pericardial effusion. The thoracic aorta is unremarkable. The visualized origins of the great vessels of the aortic arch appear patent. The central pulmonary arteries appear patent. Mediastinum/Nodes: There is no hilar or mediastinal adenopathy. There is  a moderate size hiatal hernia. The esophagus and the thyroid gland are grossly unremarkable. No mediastinal fluid collection. Lungs/Pleura: Bilateral lower lobe linear atelectasis/scarring as well as posterior subpleural dependent atelectatic changes noted. A patchy area of linear opacity in the lingula, likely atelectatic changes. Pneumonia, although less likely is not excluded. Clinical correlation is recommended. There is no pleural effusion or pneumothorax. The central airways are patent. Musculoskeletal: Acute minimally angulated fracture of the left anterior fifth rib. Multiple additional subacute or old fractures involving the lateral aspect of the left ribs including tenth and eleventh rib. Old-appearing left seventh rib fracture. There is progression of T8 compression fracture compared to the prior CT with near complete loss of vertebral body height centrally and mild buckling of the posterior cortex. No retropulsed fragment. T4 compression fracture with approximately 20% loss of vertebral body height centrally similar to prior CT. Similar appearance of T8 superior endplate compression fracture. There is acute fracture of the T7 spinous process. Old healed sternal manubrium fractures. CT ABDOMEN PELVIS FINDINGS No intra-abdominal free air or free fluid. Hepatobiliary: No focal liver abnormality is seen. No gallstones, gallbladder wall thickening, or biliary dilatation. Pancreas: Scattered coarse calcification of the pancreas, likely sequela of chronic pancreatitis. Mild peripancreatic haziness may represent an acute on chronic pancreatitis. Correlation with pancreatic enzymes recommended. There is dilatation of the main pancreatic duct with moderate associated gland atrophy. The pancreatic duct measures up to 6 mm in diameter. There is an 8 mm calcific focus in the region of the pancreatic neck along the course of the pancreatic duct similar to prior CT and concerning for an occlusive stone within the duct.  Further evaluation with MRCP recommended. No fluid collection or abscess. Spleen: Normal in size without focal abnormality. Adrenals/Urinary Tract: The adrenal glands are unremarkable. There is no hydronephrosis on either side. There is symmetric enhancement and excretion of contrast by both kidneys. The visualized ureters and urinary bladder appear unremarkable. Stomach/Bowel:  Moderate size hiatal hernia. There is no bowel obstruction or active inflammation. The appendix is normal. Vascular/Lymphatic: Mild aortoiliac atherosclerotic disease. No portal venous gas. There is no adenopathy. Reproductive: The uterus is grossly unremarkable as visualized. No pelvic mass. Other: None Musculoskeletal: Osteopenia. L2 compression fracture, new since the CT of 07/30/2017. With anterior wedging and approximately 40% loss of vertebral body height anteriorly. A small triangular fragment noted at the anterior superior endplate. IMPRESSION: 1. Acute minimally angulated fracture of the left anterior fifth rib. Acute fracture of the T7 spinous process. Multiple subacute or old left rib fractures. No pneumothorax. 2. Progression of the T8 compression fracture compared to the prior CT. 3. Age indeterminate L2 compression fracture, new since the prior CT. 4. Probable acute on chronic pancreatitis. Correlation with pancreatic enzymes recommended. A 8 mm calcific focus in the region of the pancreatic neck concerning for an occlusive stone within the duct. Further evaluation with MRCP recommended. Electronically Signed   By: Elgie Collard M.D.   On: 06/01/2018 00:10   Dg Shoulder Left  Result Date: 05/31/2018 CLINICAL DATA:  Fall with shoulder pain EXAM: LEFT SHOULDER - 2+ VIEW COMPARISON:  None. FINDINGS: Old left fifth and sixth rib fractures. AC joint is intact. No fracture or dislocation. IMPRESSION: No acute osseous abnormality Electronically Signed   By: Jasmine Pang M.D.   On: 05/31/2018 22:36   Dg Hip Unilat W Or Wo  Pelvis 2-3 Views Left  Result Date: 05/31/2018 CLINICAL DATA:  Fall with hip pain EXAM: DG HIP (WITH OR WITHOUT PELVIS) 2-3V LEFT COMPARISON:  CT 08/09/2017 FINDINGS: SI joints are non widened. Pubic symphysis and rami are intact. No fracture or dislocation. IMPRESSION: No acute osseous abnormality. Electronically Signed   By: Jasmine Pang M.D.   On: 05/31/2018 22:37    Procedures Procedures (including critical care time)  Medications Ordered in ED Medications  iopamidol (ISOVUE-300) 61 % injection (has no administration in time range)  sodium chloride (PF) 0.9 % injection (has no administration in time range)  lidocaine (LIDODERM) 5 % 1 patch (has no administration in time range)  0.9 %  sodium chloride infusion (has no administration in time range)  sodium chloride 0.9 % bolus 1,000 mL (has no administration in time range)  enoxaparin (LOVENOX) injection 40 mg (has no administration in time range)  acetaminophen (TYLENOL) tablet 650 mg (has no administration in time range)    Or  acetaminophen (TYLENOL) suppository 650 mg (has no administration in time range)  senna-docusate (Senokot-S) tablet 1 tablet (has no administration in time range)  ondansetron (ZOFRAN) tablet 4 mg (has no administration in time range)    Or  ondansetron (ZOFRAN) injection 4 mg (has no administration in time range)  albuterol (PROVENTIL) (2.5 MG/3ML) 0.083% nebulizer solution 2.5 mg (has no administration in time range)  hydrALAZINE (APRESOLINE) injection 5 mg (has no administration in time range)  iopamidol (ISOVUE-300) 61 % injection 100 mL (100 mLs Intravenous Contrast Given 05/31/18 2308)     Initial Impression / Assessment and Plan / ED Course  I have reviewed the triage vital signs and the nursing notes.  Pertinent labs & imaging results that were available during my care of the patient were reviewed by me and considered in my medical decision making (see chart for details).  Clinical Course as of  Jun 01 50  Wed May 31, 2018  2254 Stable and improved.   Hemoglobin(!): 10.8 [AM]  Thu Jun 01, 2018  5409 Consulted with Dr.  Niu. He will follow up the labwork. Will admit for debilitation, possible pancreatic ductal stone, and new fractures.    [AM]    Clinical Course User Index [AM] Elisha Ponder, PA-C    Patient is overall nontoxic appearing, however appears somnolent without a clear cause.  Patient Nuys taking any illicit drug use, however later reports that she did take Ativan today.  Denies drinking.  Patient does take 1200 mg of gabapentin 3 times a day.  Work-up is demonstrating the patient has a new T7 fracture of the spinous process.  Patient has worsening of the T8 compression fracture.  She also has a left rib fracture.  Patient also exhibits a stone in the pancreatic duct.  Unclear how long has been here, and it was not present in February 2019.  She is having epigastric tenderness.  Lipase is ordered for clinical correlation.  Given patient's polytrauma, epigastric pain with the stone in the pancreatic duct, as well as general debilitation, discussed admission with Dr. new of tried hospitalist.  Additional labs ordered including ammonia.  Patient was also evaluated by Frederik Pear, PA-C to home care was signed out to to follow-up with consult to neurosurgery for the T7 spinous process fracture.  I appreciate their involvement in the care of this patient.  This is a supervised visit with Dr. Shaune Pollack. Evaluation, management, and discharge planning discussed with this attending physician.  Final Clinical Impressions(s) / ED Diagnoses   Final diagnoses:  Fall, initial encounter  Rib pain on left side  Left arm pain  Elevated alkaline phosphatase level    ED Discharge Orders         Ordered    Ambulatory referral to Physical Therapy    Comments:  Pt had a fall and is having left sided shoulder, rib, and hip pain.   06/01/18 0014           Elisha Ponder, PA-C 06/02/18 0133    Shaune Pollack, MD 06/06/18 [phone]

## 2018-06-01 NOTE — Progress Notes (Signed)
Attempted patient for MRI after patient received IV Ativan. Patient states she needed an open scanner and couldn't tolerate ours. [facility]. She will discuss with MD if possible to get more meds and will call MRi back. Bhj

## 2018-06-01 NOTE — H&P (Signed)
History and Physical    Catherine Butler:[id number] DOB: 04-14-1961 DOA: 05/31/2018  Referring MD/NP/PA:   PCP: Loletta Specter, PA-C   Patient coming from:  The patient is coming from home.  At baseline, pt is independent for most of ADL.        Chief Complaint: fall, left abdominal pain, bilateral flank pain, back pain, mild confusion.  HPI: Catherine Butler is a 57 y.o. female with medical history significant of alcohol abuse, tobacco abuse, hypertension, COPD, asthma, GERD, depression, anxiety, Guillain-Barr syndrome, chronic pancreatitis, who presents with fall, left abdominal pain, bilateral flank pain, back pain, mild confusion.  Patient fell 3 days ago at home, caused back pain and bilateral flank pain. She states that she did not have loss of consciousness. No headache or neck pain. She also reports left abdominal pain, associated with nausea. She had vomiting yesterday, not today. No diarrhea. The abdominal pain and bilateral flank pain is constant, sharp, moderate, nonradiating. Patient is mildly confused and somnolent, but is still oriented 3. She denies symptoms of UTI or unilateral weakness. She has a generalized weakness from a previous Guillain-Barr syndrome. No facial droop or slurred speech.  ED Course: Patient was found to have WBC 7.2, lipase of 30, negative UA, electrolytes and renal function okay, temperature normal, heart rate in 90s, ETC. 96% on room air, CT head is negative for acute intracranial abnormalities. Patient is placed on telemetry bed for observation.  CT-chest and abd/pelvis showed: 1. Acute minimally angulated fracture of the left anterior fifth rib. Acute fracture of the T7 spinous process. Multiple subacute or old left rib fractures. No pneumothorax. 2. Progression of the T8 compression fracture compared to the prior CT. 3. Age indeterminate L2 compression fracture, new since the prior CT. 4. Probable acute on chronic pancreatitis. A 8 mm calcific  focus in the region of the pancreatic neck concerning for an occlusive stone within the duct.   Review of Systems:   General: no fevers, chills, no body weight gain, has fatigue HEENT: no blurry vision, hearing changes or sore throat Respiratory: no dyspnea, coughing, wheezing CV: no chest pain, no palpitations GI: has nausea, vomiting, abdominal pain, no diarrhea, constipation GU: no dysuria, burning on urination, increased urinary frequency, hematuria  Ext: no leg edema Neuro: no unilateral weakness, numbness, or tingling, no vision change or hearing loss. Has AMS and fall. Skin: no rash, no skin tear. MSK: No muscle spasm, no deformity, no limitation of range of movement in spin Heme: No easy bruising.  Travel history: No recent long distant travel.  Allergy:  Allergies  Allergen Reactions  . 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 Lipitor [Atorvastatin] Other (See Comments)    "really bad leg pain"    Past Medical History:  Diagnosis Date  . Anxiety   . Arthritis   . Asthma   . Complication of anesthesia    nausea  . COPD (chronic obstructive pulmonary disease) (HCC)   . Depression   . Guillain-Barre (HCC) 2018  . H/O alcohol abuse   . Hypertension    pt denies  . PONV (postoperative nausea and vomiting)   . Reflux   . Suicide attempt by multiple drug overdose 2014    Past Surgical History:  Procedure Laterality Date  . COLONOSCOPY WITH PROPOFOL N/A 07/21/2017   Procedure: COLONOSCOPY WITH PROPOFOL;  Surgeon: Rachael Fee, MD;  Location: [facility];  Service: Endoscopy;  Laterality: N/A;  .  LAPAROSCOPY    . laporscopy surgery for ovarian cyst  20 years ago  . TONSILLECTOMY    . TUBAL LIGATION      Social History:  reports that she has been smoking cigarettes. She has a 40.00 pack-year smoking history. She has never used smokeless tobacco. She reports current alcohol use. She reports that she does not use drugs.  Family  History:  Family History  Problem Relation Age of Onset  . Diabetes Father   . Hypertension Father 50       heart attack  . Kidney disease Father   . Heart attack Father   . Heart failure Father   . Dementia Father   . Breast cancer Mother 30       lump removed, bile duct cancer  . COPD Mother   . Hypertension Brother   . Allergic rhinitis Paternal Grandfather      Prior to Admission medications   Medication Sig Start Date End Date Taking? Authorizing Provider  acetaminophen (TYLENOL) 500 MG tablet Take 1,000 mg by mouth 2 (two) times daily as needed for headache.   Yes [provider]  albuterol (PROVENTIL HFA;VENTOLIN HFA) 108 (90 Base) MCG/ACT inhaler Inhale 2 puffs into the lungs every 6 (six) hours as needed for wheezing. 12/05/17  Yes Loletta Specter, PA-C  aspirin EC 81 MG tablet Take 1 tablet (81 mg total) by mouth daily. 03/28/18  Yes Loletta Specter, PA-C  DULoxetine (CYMBALTA) 30 MG capsule TAKE 1 CAPSULE BY MOUTH ONCE DAILY Patient taking differently: Take 30 mg by mouth daily after breakfast.  02/08/18  Yes Loletta Specter, PA-C  esomeprazole (NEXIUM) 40 MG packet Take 40 mg by mouth daily before breakfast. 01/11/18  Yes Rachael Fee, MD  gabapentin (NEURONTIN) 600 MG tablet TAKE 2 TABLETS BY MOUTH 3 TIMES DAILY Patient taking differently: Take 1,200 mg by mouth 3 (three) times daily.  03/17/18  Yes Loletta Specter, PA-C  hydrOXYzine (ATARAX/VISTARIL) 25 MG tablet Take 1 tablet (25 mg total) by mouth at bedtime. 02/28/18  Yes Loletta Specter, PA-C  ibuprofen (ADVIL,MOTRIN) 200 MG tablet Take 400 mg by mouth every 6 (six) hours as needed for fever, headache, mild pain, moderate pain or cramping.   Yes [provider]  linaclotide Karlene Einstein) 290 MCG CAPS capsule Take 1 capsule (290 mcg total) by mouth daily before breakfast. 01/11/18  Yes Rachael Fee, MD  QUEtiapine (SEROQUEL) 400 MG tablet Take 400 mg by mouth at bedtime.   Yes [provider]  benzonatate (TESSALON) 200 MG capsule Take 1 capsule (200 mg total) by mouth at bedtime. Patient not taking: Reported on 05/31/2018 04/25/18   Loletta Specter, PA-C  levofloxacin (LEVAQUIN) 500 MG tablet Take 1 tablet (500 mg total) by mouth daily. Patient not taking: Reported on 05/31/2018 04/25/18   Loletta Specter, PA-C  nicotine (NICODERM CQ - DOSED IN MG/24 HOURS) 21 mg/24hr patch Place 1 patch (21 mg total) onto the skin daily. Patient not taking: Reported on 05/31/2018 03/28/18   Loletta Specter, PA-C    Physical Exam: Vitals:   06/01/18 0313 06/01/18 0314 06/01/18 0500 06/01/18 0530  BP: (!) 140/95 (!) 140/95 99/73 (!) 135/99  Pulse: 91     Resp: 13 13 (!) 24 15  Temp:      TempSrc:      SpO2: 96%     Weight:       General: Not in acute distress HEENT:  Eyes: PERRL, EOMI, no scleral icterus.       ENT: No discharge from the ears and nose, no pharynx injection, no tonsillar enlargement.        Neck: No JVD, no bruit, no mass felt. Heme: No neck lymph node enlargement. Cardiac: S1/S2, RRR, No murmurs, No gallops or rubs. Respiratory: No rales, wheezing, rhonchi or rubs. GI: Soft, nondistended, has tenderness in left side of abdomen. No rebound pain, no organomegaly, BS present. Has tenderness in both flank areas and lower lateral chest wall. GU: No hematuria Ext: No pitting leg edema bilaterally. 2+DP/PT pulse bilaterally. Musculoskeletal: No joint deformities, No joint redness or warmth, no limitation of ROM in spin. Skin: No rashes.  Neuro: Mildly confused, somnolence, but still oriented X3, cranial nerves II-XII grossly intact, moves all extremities normally.  Psych: Patient is not psychotic, no suicidal or hemocidal ideation.  Labs on Admission: I have personally reviewed following labs and imaging studies  CBC: Recent Labs  Lab 05/31/18 2238 05/31/18 2244 06/01/18 0441  WBC 7.2  --  5.4  NEUTROABS 4.4  --   --   HGB 10.8* 12.2 10.6*    HCT 34.1* 36.0 33.5*  MCV 100.9*  --  99.1  PLT 234  --  250   Basic Metabolic Panel: Recent Labs  Lab 05/31/18 2238 05/31/18 2244 06/01/18 0441  NA 140 139 141  K 3.5 3.8 3.3*  CL 108 107 112*  CO2 22  --  21*  GLUCOSE 113* 107* 102*  BUN 9 9 8   CREATININE 0.71 0.70 0.61  CALCIUM 8.1*  --  7.7*   GFR: Estimated Creatinine Clearance: 74.4 mL/min (by C-G formula based on SCr of 0.61 mg/dL). Liver Function Tests: Recent Labs  Lab 05/31/18 2238  AST 14*  ALT 11  ALKPHOS 201*  BILITOT 0.5  PROT 5.9*  ALBUMIN 2.6*   Recent Labs  Lab 05/31/18 2238  LIPASE 30   Recent Labs  Lab 06/01/18 0105  AMMONIA 24   Coagulation Profile: Recent Labs  Lab 06/01/18 0105  INR 0.89   Cardiac Enzymes: No results for input(s): CKTOTAL, CKMB, CKMBINDEX, TROPONINI in the last 168 hours. BNP (last 3 results) No results for input(s): PROBNP in the last 8760 hours. HbA1C: No results for input(s): HGBA1C in the last 72 hours. CBG: No results for input(s): GLUCAP in the last 168 hours. Lipid Profile: No results for input(s): CHOL, HDL, LDLCALC, TRIG, CHOLHDL, LDLDIRECT in the last 72 hours. Thyroid Function Tests: No results for input(s): TSH, T4TOTAL, FREET4, T3FREE, THYROIDAB in the last 72 hours. Anemia Panel: No results for input(s): VITAMINB12, FOLATE, FERRITIN, TIBC, IRON, RETICCTPCT in the last 72 hours. Urine analysis:    Component Value Date/Time   COLORURINE YELLOW 06/01/2018 0110   APPEARANCEUR CLEAR 06/01/2018 0110   LABSPEC >1.046 (H) 06/01/2018 0110   PHURINE 6.0 06/01/2018 0110   GLUCOSEU NEGATIVE 06/01/2018 0110   HGBUR NEGATIVE 06/01/2018 0110   BILIRUBINUR NEGATIVE 06/01/2018 0110   KETONESUR 5 (A) 06/01/2018 0110   PROTEINUR NEGATIVE 06/01/2018 0110   UROBILINOGEN 0.2 09/13/2012 0124   NITRITE NEGATIVE 06/01/2018 0110   LEUKOCYTESUR NEGATIVE 06/01/2018 0110   Sepsis Labs: @LABRCNTIP (procalcitonin:4,lacticidven:4) )No results found for this or any  previous visit (from the past 240 hour(s)).   Radiological Exams on Admission: Dg Elbow Complete Left  Result Date: 05/31/2018 CLINICAL DATA:  Fall with elbow pain EXAM: LEFT ELBOW - COMPLETE 3+ VIEW COMPARISON:  None. FINDINGS: There is no evidence of fracture, dislocation,  or joint effusion. There is no evidence of arthropathy or other focal bone abnormality. Soft tissues are unremarkable. IMPRESSION: Negative. Electronically Signed   By: Jasmine Pang M.D.   On: 05/31/2018 22:36   Ct Head Wo Contrast  Result Date: 05/31/2018 CLINICAL DATA:  Head trauma from falls over the past 2 days. EXAM: CT HEAD WITHOUT CONTRAST TECHNIQUE: Contiguous axial images were obtained from the base of the skull through the vertex without intravenous contrast. COMPARISON:  03/30/2018. FINDINGS: Brain: Stable mildly enlarged subarachnoid spaces. Normal size and position of the ventricles. No intracranial hemorrhage, mass lesion or CT evidence of acute infarction. Vascular: No hyperdense vessel or unexpected calcification. Skull: Normal. Negative for fracture or focal lesion. Sinuses/Orbits: Unremarkable. Other: None. IMPRESSION: No acute abnormality.  Mild peripheral atrophy. Electronically Signed   By: Beckie Salts M.D.   On: 05/31/2018 23:58   Ct Chest W Contrast  Result Date: 06/01/2018 CLINICAL DATA:  57 year old female with trauma and left-sided pain. EXAM: CT CHEST, ABDOMEN, AND PELVIS WITH CONTRAST TECHNIQUE: Multidetector CT imaging of the chest, abdomen and pelvis was performed following the standard protocol during bolus administration of intravenous contrast. CONTRAST:  ISOVUE-300 IOPAMIDOL (ISOVUE-300) INJECTION 61% COMPARISON:  Chest CT dated 04/10/2018 FINDINGS: CT CHEST FINDINGS Cardiovascular: There is no cardiomegaly or pericardial effusion. The thoracic aorta is unremarkable. The visualized origins of the great vessels of the aortic arch appear patent. The central pulmonary arteries appear patent.  Mediastinum/Nodes: There is no hilar or mediastinal adenopathy. There is a moderate size hiatal hernia. The esophagus and the thyroid gland are grossly unremarkable. No mediastinal fluid collection. Lungs/Pleura: Bilateral lower lobe linear atelectasis/scarring as well as posterior subpleural dependent atelectatic changes noted. A patchy area of linear opacity in the lingula, likely atelectatic changes. Pneumonia, although less likely is not excluded. Clinical correlation is recommended. There is no pleural effusion or pneumothorax. The central airways are patent. Musculoskeletal: Acute minimally angulated fracture of the left anterior fifth rib. Multiple additional subacute or old fractures involving the lateral aspect of the left ribs including tenth and eleventh rib. Old-appearing left seventh rib fracture. There is progression of T8 compression fracture compared to the prior CT with near complete loss of vertebral body height centrally and mild buckling of the posterior cortex. No retropulsed fragment. T4 compression fracture with approximately 20% loss of vertebral body height centrally similar to prior CT. Similar appearance of T8 superior endplate compression fracture. There is acute fracture of the T7 spinous process. Old healed sternal manubrium fractures. CT ABDOMEN PELVIS FINDINGS No intra-abdominal free air or free fluid. Hepatobiliary: No focal liver abnormality is seen. No gallstones, gallbladder wall thickening, or biliary dilatation. Pancreas: Scattered coarse calcification of the pancreas, likely sequela of chronic pancreatitis. Mild peripancreatic haziness may represent an acute on chronic pancreatitis. Correlation with pancreatic enzymes recommended. There is dilatation of the main pancreatic duct with moderate associated gland atrophy. The pancreatic duct measures up to 6 mm in diameter. There is an 8 mm calcific focus in the region of the pancreatic neck along the course of the pancreatic duct  similar to prior CT and concerning for an occlusive stone within the duct. Further evaluation with MRCP recommended. No fluid collection or abscess. Spleen: Normal in size without focal abnormality. Adrenals/Urinary Tract: The adrenal glands are unremarkable. There is no hydronephrosis on either side. There is symmetric enhancement and excretion of contrast by both kidneys. The visualized ureters and urinary bladder appear unremarkable. Stomach/Bowel: Moderate size hiatal hernia. There is no  bowel obstruction or active inflammation. The appendix is normal. Vascular/Lymphatic: Mild aortoiliac atherosclerotic disease. No portal venous gas. There is no adenopathy. Reproductive: The uterus is grossly unremarkable as visualized. No pelvic mass. Other: None Musculoskeletal: Osteopenia. L2 compression fracture, new since the CT of 07/30/2017. With anterior wedging and approximately 40% loss of vertebral body height anteriorly. A small triangular fragment noted at the anterior superior endplate. IMPRESSION: 1. Acute minimally angulated fracture of the left anterior fifth rib. Acute fracture of the T7 spinous process. Multiple subacute or old left rib fractures. No pneumothorax. 2. Progression of the T8 compression fracture compared to the prior CT. 3. Age indeterminate L2 compression fracture, new since the prior CT. 4. Probable acute on chronic pancreatitis. Correlation with pancreatic enzymes recommended. A 8 mm calcific focus in the region of the pancreatic neck concerning for an occlusive stone within the duct. Further evaluation with MRCP recommended. Electronically Signed   By: Elgie CollardArash  Radparvar M.D.   On: 06/01/2018 00:10   Ct Abdomen Pelvis W Contrast  Result Date: 06/01/2018 CLINICAL DATA:  57 year old female with trauma and left-sided pain. EXAM: CT CHEST, ABDOMEN, AND PELVIS WITH CONTRAST TECHNIQUE: Multidetector CT imaging of the chest, abdomen and pelvis was performed following the standard protocol during  bolus administration of intravenous contrast. CONTRAST:  100mL ISOVUE-300 IOPAMIDOL (ISOVUE-300) INJECTION 61% COMPARISON:  Chest CT dated 04/10/2018 FINDINGS: CT CHEST FINDINGS Cardiovascular: There is no cardiomegaly or pericardial effusion. The thoracic aorta is unremarkable. The visualized origins of the great vessels of the aortic arch appear patent. The central pulmonary arteries appear patent. Mediastinum/Nodes: There is no hilar or mediastinal adenopathy. There is a moderate size hiatal hernia. The esophagus and the thyroid gland are grossly unremarkable. No mediastinal fluid collection. Lungs/Pleura: Bilateral lower lobe linear atelectasis/scarring as well as posterior subpleural dependent atelectatic changes noted. A patchy area of linear opacity in the lingula, likely atelectatic changes. Pneumonia, although less likely is not excluded. Clinical correlation is recommended. There is no pleural effusion or pneumothorax. The central airways are patent. Musculoskeletal: Acute minimally angulated fracture of the left anterior fifth rib. Multiple additional subacute or old fractures involving the lateral aspect of the left ribs including tenth and eleventh rib. Old-appearing left seventh rib fracture. There is progression of T8 compression fracture compared to the prior CT with near complete loss of vertebral body height centrally and mild buckling of the posterior cortex. No retropulsed fragment. T4 compression fracture with approximately 20% loss of vertebral body height centrally similar to prior CT. Similar appearance of T8 superior endplate compression fracture. There is acute fracture of the T7 spinous process. Old healed sternal manubrium fractures. CT ABDOMEN PELVIS FINDINGS No intra-abdominal free air or free fluid. Hepatobiliary: No focal liver abnormality is seen. No gallstones, gallbladder wall thickening, or biliary dilatation. Pancreas: Scattered coarse calcification of the pancreas, likely  sequela of chronic pancreatitis. Mild peripancreatic haziness may represent an acute on chronic pancreatitis. Correlation with pancreatic enzymes recommended. There is dilatation of the main pancreatic duct with moderate associated gland atrophy. The pancreatic duct measures up to 6 mm in diameter. There is an 8 mm calcific focus in the region of the pancreatic neck along the course of the pancreatic duct similar to prior CT and concerning for an occlusive stone within the duct. Further evaluation with MRCP recommended. No fluid collection or abscess. Spleen: Normal in size without focal abnormality. Adrenals/Urinary Tract: The adrenal glands are unremarkable. There is no hydronephrosis on either side. There is symmetric enhancement and  excretion of contrast by both kidneys. The visualized ureters and urinary bladder appear unremarkable. Stomach/Bowel: Moderate size hiatal hernia. There is no bowel obstruction or active inflammation. The appendix is normal. Vascular/Lymphatic: Mild aortoiliac atherosclerotic disease. No portal venous gas. There is no adenopathy. Reproductive: The uterus is grossly unremarkable as visualized. No pelvic mass. Other: None Musculoskeletal: Osteopenia. L2 compression fracture, new since the CT of 07/30/2017. With anterior wedging and approximately 40% loss of vertebral body height anteriorly. A small triangular fragment noted at the anterior superior endplate. IMPRESSION: 1. Acute minimally angulated fracture of the left anterior fifth rib. Acute fracture of the T7 spinous process. Multiple subacute or old left rib fractures. No pneumothorax. 2. Progression of the T8 compression fracture compared to the prior CT. 3. Age indeterminate L2 compression fracture, new since the prior CT. 4. Probable acute on chronic pancreatitis. Correlation with pancreatic enzymes recommended. A 8 mm calcific focus in the region of the pancreatic neck concerning for an occlusive stone within the duct. Further  evaluation with MRCP recommended. Electronically Signed   By: Elgie Collard M.D.   On: 06/01/2018 00:10   Dg Shoulder Left  Result Date: 05/31/2018 CLINICAL DATA:  Fall with shoulder pain EXAM: LEFT SHOULDER - 2+ VIEW COMPARISON:  None. FINDINGS: Old left fifth and sixth rib fractures. AC joint is intact. No fracture or dislocation. IMPRESSION: No acute osseous abnormality Electronically Signed   By: Jasmine Pang M.D.   On: 05/31/2018 22:36   Dg Hip Unilat W Or Wo Pelvis 2-3 Views Left  Result Date: 05/31/2018 CLINICAL DATA:  Fall with hip pain EXAM: DG HIP (WITH OR WITHOUT PELVIS) 2-3V LEFT COMPARISON:  CT 08/09/2017 FINDINGS: SI joints are non widened. Pubic symphysis and rami are intact. No fracture or dislocation. IMPRESSION: No acute osseous abnormality. Electronically Signed   By: Jasmine Pang M.D.   On: 05/31/2018 22:37     EKG: Independently reviewed.  Sinus rhythm, QTC 484, early R wave progression, nonspecific T wave change.  Assessment/Plan Principal Problem:   Fall Active Problems:   COPD (chronic obstructive pulmonary disease) (HCC)   Depression   Abdominal pain   Essential hypertension   Guillain Barr syndrome (HCC)   Acute metabolic encephalopathy   History of vertebral fracture  Fall: likely due to combination of alcohol abuse and history of Guillain-Barr syndrome with generalized weakness.  -Place on telemetry bed for observation -PT/OT -Pain control for vertebral fracture and rib fracture as below -prn Robaxin  -fall precaution  Vertebral fracture and rib: as shown in CT scan  -pain ctrl:  prn Percocet and Tylenol, and morphine -Pulmonary toilet -please call neurosurgeon in AM  COPD (chronic obstructive pulmonary disease) (HCC): stable -prn albuterol nebs  Abdominal pain: CT scan showed possible acute on chronic pancreatitis. A 8 mm calcific focus in the region of the pancreatic neck concerning for an occlusive stone within the duct. Lipase normal,  30. -prn morphine for pain and Zofran for nausea -IVF: 1L NS bolus and then 100 cc/h -may need to discuss with GI about pancreatic duct stone  Essential hypertension: not on meds at home. Bp 126/91 -IV hydralazine when necessary  Depression: Seroquel and Cymbalta  Guillain Barr syndrome [facility]): Patient is on high dose of gabapentin 1200 mg 3 times a day -Hold gabapentin due to altered mental status  Alcohol and tobacco abuse: -CiWA and nicotine patch  Acute metabolic encephalopathy: Patient is mildly confused, somnolent, but still oriented 3. CT head is negative for acute intracranial  abnormalities. Etiology is not clear. Patient is high dose of gabapentin which may have contributed partially. -Frequent neuro check -Hold home gabapentin       DVT ppx: SQ Lovenox Code Status: Full code Family Communication: None at bed side.              Yes, patient's  Daughter at bed side Disposition Plan:  Anticipate discharge back to previous home environment Consults called:  none Admission status: Obs / tele       Date of Service 06/01/2018    Lorretta Harp [facility] Pager [phone]  If 7PM-7AM, please contact night-coverage [email or web address] Password [facility] 06/01/2018, 5:48 AM

## 2018-06-01 NOTE — ED Notes (Signed)
Per MRI: Pt was "bumped for a higher priority pt" MRI to come get pt around 12p

## 2018-06-01 NOTE — Progress Notes (Signed)
PROGRESS NOTE  Catherine Butler D Fuson GEX:[id number] DOB: 10/20/1960 DOA: 05/31/2018 PCP: Loletta SpecterGomez, Roger David, PA-C  Brief Narrative: 57 year old woman PMH alcohol abuse, Gillain-Barre syndrome, chronic pancreatitis presented after a fall 2 to 4 days ago out of bed resulting in left-sided flank pain and back pain.  Presented  Assessment/Plan Acute fracture T7 spinous process, progression of T8 compression fracture, age-indeterminate L2 compression fracture --Discussed with neurosurgery Dr. Venetia MaxonStern, recommends activity as tolerated.  No need for brace.  Follow-up in the office in the outpatient setting for repeat x-rays. --PT/OT eval, pain control  Minimally angulated fracture left anterior fifth rib, multiple additional subacute and old fractures lateral aspect left ribs 10-11. --Treat pain, respiratory status appears stable with good air movement, CT without evidence of complicating features.  Chronic pancreatitis.  CT showed 8 mm calcific focus in the region of the pancreatic neck concerning for occlusive stone within the duct.  MRCP was recommended. --Patient completely asymptomatic, lipase is negative, she does not have discrete epigastric pain although she is nauseous.  We will proceed with MRCP to further characterize.  If negative can probably advance diet and monitor.  Acute metabolic encephalopathy.  CT head negative. --Etiology unclear at this appears resolved.  Consider medication effect.  Monitor clinically.  PMH Gillain-Barre with multiple falls at home, new and old trauma --Falls could be related to alcohol use, benzodiazepine use, marijuana.   -- PT, OT evaluation  COPD --Appears stable.  Essential hypertension --Appears stable.  Urine drug screen positive for benzodiazepines and marijuana --Suspect contributing to falls if accurate.  Benzodiazepines not currently listed on her home medication list.  PMH alcohol abuse per chart.  Monitor for withdrawal.   DVT prophylaxis:  enoxaparin Code Status: Full Family Communication: none Disposition Plan: pending therapy evaluations    Brendia Sacksaniel Jadarion Halbig, MD  [facility] Direct contact: [phone] --Via amion app OR  --[email or web address]; password [email or web address]  7PM-7AM contact night coverage as above 06/01/2018, 10:58 AM  LOS: 0 days   Consultants:    Procedures:    Antimicrobials:    Interval history/Subjective: Patient reports pain over the left anterior rib cage, nausea no vomiting.  Chronic back pain which she feels is at baseline.  No lower extremity weakness, numbness, paresthesias.  No bowel or bladder dysfunction.  No sacral dysesthesia.  Objective: Vitals:  Vitals:   06/01/18 0854 06/01/18 0936  BP: (!) 150/102 (!) 143/92  Pulse: 99 99  Resp:  13  Temp:    SpO2:  93%    Exam:  Constitutional:  . Appears calm, mildly uncomfortable, nontoxic Eyes:  . pupils and irises appear normal ENMT:  . grossly normal hearing  . Lips appear normal Respiratory:  . CTA bilaterally, no w/r/r.  . Respiratory effort normal.  Cardiovascular:  . RRR, no m/r/g . No LE extremity edema   Abdomen:  . Soft, nontender, nondistended. Musculoskeletal:  . RLE, LLE   o strength and tone normal, no atrophy, no abnormal movements o No tenderness, masses o No pain with palpation over the thoracic or lumbar spine.  Skin: Skin of the lower chest wall appears unremarkable.  Skin on the back appears unremarkable. Neurologic:  . Sensation bilateral lower extremities intact. Psychiatric:  . Mental status o Mood difficult to gauge.  Affect is odd. . judgment and insight appear intact     I have personally reviewed the following:   Data: . Basic metabolic panel unremarkable.  Alkaline phosphatase mildly elevated 201.  Lipase 30.  AST, ALT and total bilirubin  within normal limits. . Potassium 3.3, remainder BMP unremarkable . Hemoglobin stable today at 10.6, WBC and platelets within normal limits . CT head  negative  Scheduled Meds: . aspirin EC  81 mg Oral Daily  . DULoxetine  30 mg Oral QPC breakfast  . enoxaparin (LOVENOX) injection  40 mg Subcutaneous Q24H  . esomeprazole  40 mg Oral QAC breakfast  . folic acid  1 mg Oral Daily  . iopamidol      . lidocaine  1 patch Transdermal Q24H  . linaclotide  290 mcg Oral QAC breakfast  . LORazepam  0-4 mg Intravenous Q6H   Followed by  . [START ON 06/03/2018] LORazepam  0-4 mg Intravenous Q12H  . multivitamin with minerals  1 tablet Oral Daily  . nicotine  21 mg Transdermal Daily  . QUEtiapine  400 mg Oral QHS  . sodium chloride (PF)      . thiamine  100 mg Oral Daily   Or  . thiamine  100 mg Intravenous Daily   Continuous Infusions: . sodium chloride Stopped (06/01/18 1049)    Principal Problem:   Fall Active Problems:   COPD (chronic obstructive pulmonary disease) (HCC)   Abdominal pain   Essential hypertension   Guillain Barr syndrome (HCC)   Acute metabolic encephalopathy   History of vertebral fracture   Closed fracture of spinous process of thoracic vertebra (HCC)   Thoracic compression fracture (HCC)   Lumbar compression fracture (HCC)   Closed rib fracture   LOS: 0 days     Time spent review of chart, discussion with patient, interview, examination and discussion of care with neurosurgery: 0830-850, 1040-1100

## 2018-06-01 NOTE — ED Notes (Signed)
Patient transported to MRI 

## 2018-06-02 DIAGNOSIS — S22020A Wedge compression fracture of second thoracic vertebra, initial encounter for closed fracture: Secondary | ICD-10-CM

## 2018-06-02 LAB — CBC
HCT: 33.6 % — ABNORMAL LOW (ref 36.0–46.0)
Hemoglobin: 10.3 g/dL — ABNORMAL LOW (ref 12.0–15.0)
MCH: 31.8 pg (ref 26.0–34.0)
MCHC: 30.7 g/dL (ref 30.0–36.0)
MCV: 103.7 fL — ABNORMAL HIGH (ref 80.0–100.0)
Platelets: 226 10*3/uL (ref 150–400)
RBC: 3.24 MIL/uL — AB (ref 3.87–5.11)
RDW: 13.6 % (ref 11.5–15.5)
WBC: 5.9 10*3/uL (ref 4.0–10.5)
nRBC: 0 % (ref 0.0–0.2)

## 2018-06-02 LAB — COMPREHENSIVE METABOLIC PANEL
ALT: 9 U/L (ref 0–44)
AST: 13 U/L — ABNORMAL LOW (ref 15–41)
Albumin: 2.3 g/dL — ABNORMAL LOW (ref 3.5–5.0)
Alkaline Phosphatase: 171 U/L — ABNORMAL HIGH (ref 38–126)
Anion gap: 12 (ref 5–15)
BUN: 6 mg/dL (ref 6–20)
CO2: 17 mmol/L — ABNORMAL LOW (ref 22–32)
Calcium: 7.7 mg/dL — ABNORMAL LOW (ref 8.9–10.3)
Chloride: 111 mmol/L (ref 98–111)
Creatinine, Ser: 0.59 mg/dL (ref 0.44–1.00)
GFR calc non Af Amer: >60 mL/min (ref >60)
Glucose, Bld: 94 mg/dL (ref 70–99)
Potassium: 3.1 mmol/L — ABNORMAL LOW (ref 3.5–5.1)
Sodium: 140 mmol/L (ref 135–145)
TOTAL PROTEIN: 5.3 g/dL — AB (ref 6.5–8.1)
Total Bilirubin: 0.5 mg/dL (ref 0.3–1.2)

## 2018-06-02 LAB — LIPASE, BLOOD: Lipase: 21 U/L (ref 11–51)

## 2018-06-02 MED ORDER — LIDOCAINE 5 % EX PTCH: 1.0000 | MEDICATED_PATCH | CUTANEOUS | 0 refills | Status: DC

## 2018-06-02 MED ORDER — POTASSIUM CHLORIDE CRYS ER 20 MEQ PO TBCR
40.0000 meq | EXTENDED_RELEASE_TABLET | ORAL | Status: DC
  Administered 2018-06-02: 40 meq via ORAL
  Filled 2018-06-02: qty 2

## 2018-06-02 MED ORDER — GUAIFENESIN-DM 100-10 MG/5ML PO SYRP
5.0000 mL | ORAL_SOLUTION | ORAL | Status: DC | PRN
  Administered 2018-06-02: 5 mL via ORAL
  Filled 2018-06-02: qty 10

## 2018-06-02 NOTE — Progress Notes (Signed)
Occupational Therapy Treatment Patient Details Name: Catherine Butler MRN: [id number] DOB: [birth year] Today's Date: 06/02/2018    History of present illness 57 y.o. female with medical history significant of COPD, DM, EtOH abuse, T11 endplate fracture, ongoing tobacco use, anxiety, presents to the hospital 57 year old woman PMH alcohol abuse, Gillain-Barre syndrome, chronic pancreatitis, presented after a fall 2 to 4 days ago out of bed resulting in left-sided flank pain and back pain..  Acute fracture T7 spinous process, progression of T8 compression fracture, age-indeterminate L2 compression fracture.   OT comments  Pt with improved spt to [facility] today. Limited by pain. She needs a lot of assistance for bed mobility.  Pt refusing SNF; she has had a bad experience in the past  Follow Up Recommendations  SNF;Supervision/Assistance - 24 hour ; refusing snf   Equipment Recommendations  None recommended by OT(pt has bsc)    Recommendations for Other Services      Precautions / Restrictions Precautions Precautions: Fall Precaution Comments: multiple spine compression fractures.  No brace required Restrictions Weight Bearing Restrictions: No       Mobility Bed Mobility             Sit to sidelying: Mod assist General bed mobility comments: assist for trunk  Transfers       Sit to Stand: Min assist Stand pivot transfers: Min guard       General transfer comment: steadying assist to stand and min guard for safety during SPT    Balance                                           ADL either performed or assessed with clinical judgement   ADL                           Toilet Transfer: Minimal assistance   Toileting- Clothing Manipulation and Hygiene: Minimal assistance         General ADL Comments: steadying assistance for standing. Pt with increased pain and could not have more pain meds at the time.  Educated on rolling and getting up/down  from sidelying, but she did not want to perform that way     Vision       Perception     Praxis      Cognition Arousal/Alertness: Awake/alert Behavior During Therapy: WFL for tasks assessed/performed Overall Cognitive Status: Within Functional Limits for tasks assessed                                          Exercises     Shoulder Instructions       General Comments      Pertinent Vitals/ Pain       Pain Assessment: 0-10 Pain Score: 9  Pain Location: back and left side Pain Descriptors / Indicators: Aching;Burning;Cramping;Discomfort Pain Intervention(s): Limited activity within patient's tolerance;Monitored during session;Premedicated before session;Repositioned  Home Living                                          Prior Functioning/Environment              Frequency  Progress Toward Goals  OT Goals(current goals can now be found in the care plan section)  Progress towards OT goals: Progressing toward goals     Plan      Co-evaluation                 AM-PAC OT "6 Clicks" Daily Activity     Outcome Measure   Help from another person eating meals?: None Help from another person taking care of personal grooming?: A Little Help from another person toileting, which includes using toliet, bedpan, or urinal?: A Little Help from another person bathing (including washing, rinsing, drying)?: A Lot Help from another person to put on and taking off regular upper body clothing?: A Little Help from another person to put on and taking off regular lower body clothing?: A Lot 6 Click Score: 17    End of Session    OT Visit Diagnosis: Unsteadiness on feet (R26.81)   Activity Tolerance Patient limited by pain   Patient Left in bed;with call bell/phone within reach;with bed alarm set   Nurse Communication          Time: [id number] OT Time Calculation (min): 18 min  Charges: OT General Charges $OT  Visit: 1 Visit OT Treatments $Self Care/Home Management : 8-22 mins  Marica OtterMaryellen Tnya Ades, OTR/L Acute Rehabilitation Services [phone] [facility] pager [phone] office 06/02/2018   Loriann Bosserman 06/02/2018, 11:55 AM

## 2018-06-02 NOTE — Discharge Summary (Signed)
Physician Discharge Summary  Catherine Butler ZOX:[id number] DOB: 01-Oct-1960 DOA: 05/31/2018  PCP: Loletta Specter, PA-C  Admit date: 05/31/2018 Discharge date: 06/02/2018  Admitted From: Home Disposition: Home  Recommendations for Outpatient Follow-up:  1. Follow up with PCP in 1-2 weeks 2. Please obtain CMP/CBC/alkaline phosphatase in one week 3. Please follow-up with your primary care doctor today obtain an MRCP of her abdomen.   Home Health: Yes Equipment/Devices: None  Discharge Condition: Stable CODE STATUS: Full Diet recommendation: Regular  Brief/Interim Summary:  #) Fall/rib and spinous process fractures/history of Guillain-Barr syndrome: Patient presented with falls as well as rib fractures.  Initially this was attributed to Guillain-Barr syndrome but is more likely related to her alcohol abuse.  She was evaluated by physical therapy and Occupational Therapy and recommended discharging to skilled nursing facility but she was not interested.  She was given counseling on alcohol abuse.  She was given pain control with lidocaine patches and oral pain medications.  She was given home health physical therapy as an outpatient.  She did not have any evidence of lower extremity weakness or compromise of the spinal canal on exam.  #) Alcohol abuse: Patient drinks approximately a gallon of vodka a week.  She has not had complicated withdrawals but has had history of seeking help for alcohol abuse.  Extensive counseling was given to the patient.  She does not want any medication management for this.  She will follow-up with AA as an outpatient.  #) Elevated alkaline phosphatase: Patient was noted to have mildly elevated alkaline phosphatase.  This is likely secondary to recurrent falls.  Will have patient follow-up as an outpatient.  #) Chronic pancreatitis/abdominal pain: Due to abdominal pain as a presenting symptom patient had a CT abdomen pelvis that showed possible acute on chronic  pancreatitis.  Her lipase was not dramatically elevated.  She did not have signs or symptoms classic for pancreatitis.  An 8 mm calcific focus was noted at the pancreatic neck concerning for a stone.  An MRCP was attempted however patient could not tolerate it due to claustrophobia.  She should follow-up as an outpatient to have an MRCP.  Patient was continued on home linaclotide.  #) Pain/psych: Patient was continued on home duloxetine, gabapentin, quetiapine.  Discharge Diagnoses:  Principal Problem:   Fall Active Problems:   COPD (chronic obstructive pulmonary disease) (HCC)   Abdominal pain   Essential hypertension   Guillain Barr syndrome (HCC)   Acute metabolic encephalopathy   History of vertebral fracture   Closed fracture of spinous process of thoracic vertebra (HCC)   Thoracic compression fracture (HCC)   Lumbar compression fracture (HCC)   Closed rib fracture   Acute pancreatitis due to calculus of common bile duct    Discharge Instructions  Discharge Instructions    Ambulatory referral to Physical Therapy   Complete by:  As directed    Pt had a fall and is having left sided shoulder, rib, and hip pain.   Call MD for:  difficulty breathing, headache or visual disturbances   Complete by:  As directed    Call MD for:  hives   Complete by:  As directed    Call MD for:  persistant nausea and vomiting   Complete by:  As directed    Call MD for:  redness, tenderness, or signs of infection (pain, swelling, redness, odor or green/yellow discharge around incision site)   Complete by:  As directed    Call MD for:  severe uncontrolled pain   Complete by:  As directed    Call MD for:  temperature >100.4   Complete by:  As directed    Diet - low sodium heart healthy   Complete by:  As directed    Discharge instructions   Complete by:  As directed    Please follow-up with your primary care doctor in 1 week.   Increase activity slowly   Complete by:  As directed       Allergies as of 06/02/2018      Reactions   Influenza Vaccines Other (See Comments)   Patient got Guillain Barre   Lipitor [atorvastatin] Other (See Comments)   "really bad leg pain"      Medication List    STOP taking these medications   levofloxacin 500 MG tablet Commonly known as:  LEVAQUIN     TAKE these medications   acetaminophen 500 MG tablet Commonly known as:  TYLENOL Take 1,000 mg by mouth 2 (two) times daily as needed for headache.   albuterol 108 (90 Base) MCG/ACT inhaler Commonly known as:  PROVENTIL HFA;VENTOLIN HFA Inhale 2 puffs into the lungs every 6 (six) hours as needed for wheezing.   aspirin EC 81 MG tablet Take 1 tablet (81 mg total) by mouth daily.   benzonatate 200 MG capsule Commonly known as:  TESSALON Take 1 capsule (200 mg total) by mouth at bedtime.   DULoxetine 30 MG capsule Commonly known as:  CYMBALTA TAKE 1 CAPSULE BY MOUTH ONCE DAILY What changed:  when to take this   esomeprazole 40 MG packet Commonly known as:  NEXIUM Take 40 mg by mouth daily before breakfast.   gabapentin 600 MG tablet Commonly known as:  NEURONTIN TAKE 2 TABLETS BY MOUTH 3 TIMES DAILY What changed:  See the new instructions.   hydrOXYzine 25 MG tablet Commonly known as:  ATARAX/VISTARIL Take 1 tablet (25 mg total) by mouth at bedtime.   ibuprofen 200 MG tablet Commonly known as:  ADVIL,MOTRIN Take 400 mg by mouth every 6 (six) hours as needed for fever, headache, mild pain, moderate pain or cramping.   lidocaine 5 % Commonly known as:  LIDODERM Place 1 patch onto the skin daily. Remove & Discard patch within 12 hours or as directed by MD Start taking on:  June 03, 2018   linaclotide 290 MCG Caps capsule Commonly known as:  LINZESS Take 1 capsule (290 mcg total) by mouth daily before breakfast.   nicotine 21 mg/24hr patch Commonly known as:  NICODERM CQ - dosed in mg/24 hours Place 1 patch (21 mg total) onto the skin daily.   QUEtiapine  400 MG tablet Commonly known as:  SEROQUEL Take 400 mg by mouth at bedtime.      Follow-up Information    Schedule an appointment as soon as possible for a visit  with Loletta Specter, PA-C.   Specialty:  Physician Assistant Why:  For recheck of blood work in 7-10 days. One of your liver numbers, alkaline phosphatase, is elevated and will need recheck. Contact information: [address] [place] [address] [phone]          Allergies  Allergen Reactions  . 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 Lipitor [Atorvastatin] Other (See Comments)    "really bad leg pain"    Consultations: None  Procedures/Studies: Dg Elbow Complete Left  Result Date: 05/31/2018 CLINICAL DATA:  Fall with elbow pain EXAM: LEFT  ELBOW - COMPLETE 3+ VIEW COMPARISON:  None. FINDINGS: There is no evidence of fracture, dislocation, or joint effusion. There is no evidence of arthropathy or other focal bone abnormality. Soft tissues are unremarkable. IMPRESSION: Negative. Electronically Signed   By: Jasmine Pang M.D.   On: 05/31/2018 22:36   Ct Head Wo Contrast  Result Date: 05/31/2018 CLINICAL DATA:  Head trauma from falls over the past 2 days. EXAM: CT HEAD WITHOUT CONTRAST TECHNIQUE: Contiguous axial images were obtained from the base of the skull through the vertex without intravenous contrast. COMPARISON:  03/30/2018. FINDINGS: Brain: Stable mildly enlarged subarachnoid spaces. Normal size and position of the ventricles. No intracranial hemorrhage, mass lesion or CT evidence of acute infarction. Vascular: No hyperdense vessel or unexpected calcification. Skull: Normal. Negative for fracture or focal lesion. Sinuses/Orbits: Unremarkable. Other: None. IMPRESSION: No acute abnormality.  Mild peripheral atrophy. Electronically Signed   By: Beckie Salts M.D.   On: 05/31/2018 23:58   Ct Chest W Contrast  Result Date: 06/01/2018 CLINICAL DATA:  57 year old  female with trauma and left-sided pain. EXAM: CT CHEST, ABDOMEN, AND PELVIS WITH CONTRAST TECHNIQUE: Multidetector CT imaging of the chest, abdomen and pelvis was performed following the standard protocol during bolus administration of intravenous contrast. CONTRAST:  ISOVUE-300 IOPAMIDOL (ISOVUE-300) INJECTION 61% COMPARISON:  Chest CT dated 04/10/2018 FINDINGS: CT CHEST FINDINGS Cardiovascular: There is no cardiomegaly or pericardial effusion. The thoracic aorta is unremarkable. The visualized origins of the great vessels of the aortic arch appear patent. The central pulmonary arteries appear patent. Mediastinum/Nodes: There is no hilar or mediastinal adenopathy. There is a moderate size hiatal hernia. The esophagus and the thyroid gland are grossly unremarkable. No mediastinal fluid collection. Lungs/Pleura: Bilateral lower lobe linear atelectasis/scarring as well as posterior subpleural dependent atelectatic changes noted. A patchy area of linear opacity in the lingula, likely atelectatic changes. Pneumonia, although less likely is not excluded. Clinical correlation is recommended. There is no pleural effusion or pneumothorax. The central airways are patent. Musculoskeletal: Acute minimally angulated fracture of the left anterior fifth rib. Multiple additional subacute or old fractures involving the lateral aspect of the left ribs including tenth and eleventh rib. Old-appearing left seventh rib fracture. There is progression of T8 compression fracture compared to the prior CT with near complete loss of vertebral body height centrally and mild buckling of the posterior cortex. No retropulsed fragment. T4 compression fracture with approximately 20% loss of vertebral body height centrally similar to prior CT. Similar appearance of T8 superior endplate compression fracture. There is acute fracture of the T7 spinous process. Old healed sternal manubrium fractures. CT ABDOMEN PELVIS FINDINGS No intra-abdominal  free air or free fluid. Hepatobiliary: No focal liver abnormality is seen. No gallstones, gallbladder wall thickening, or biliary dilatation. Pancreas: Scattered coarse calcification of the pancreas, likely sequela of chronic pancreatitis. Mild peripancreatic haziness may represent an acute on chronic pancreatitis. Correlation with pancreatic enzymes recommended. There is dilatation of the main pancreatic duct with moderate associated gland atrophy. The pancreatic duct measures up to 6 mm in diameter. There is an 8 mm calcific focus in the region of the pancreatic neck along the course of the pancreatic duct similar to prior CT and concerning for an occlusive stone within the duct. Further evaluation with MRCP recommended. No fluid collection or abscess. Spleen: Normal in size without focal abnormality. Adrenals/Urinary Tract: The adrenal glands are unremarkable. There is no hydronephrosis on either side. There is symmetric enhancement and excretion of contrast by both kidneys.  The visualized ureters and urinary bladder appear unremarkable. Stomach/Bowel: Moderate size hiatal hernia. There is no bowel obstruction or active inflammation. The appendix is normal. Vascular/Lymphatic: Mild aortoiliac atherosclerotic disease. No portal venous gas. There is no adenopathy. Reproductive: The uterus is grossly unremarkable as visualized. No pelvic mass. Other: None Musculoskeletal: Osteopenia. L2 compression fracture, new since the CT of 07/30/2017. With anterior wedging and approximately 40% loss of vertebral body height anteriorly. A small triangular fragment noted at the anterior superior endplate. IMPRESSION: 1. Acute minimally angulated fracture of the left anterior fifth rib. Acute fracture of the T7 spinous process. Multiple subacute or old left rib fractures. No pneumothorax. 2. Progression of the T8 compression fracture compared to the prior CT. 3. Age indeterminate L2 compression fracture, new since the prior CT. 4.  Probable acute on chronic pancreatitis. Correlation with pancreatic enzymes recommended. A 8 mm calcific focus in the region of the pancreatic neck concerning for an occlusive stone within the duct. Further evaluation with MRCP recommended. Electronically Signed   By: Elgie Collard M.D.   On: 06/01/2018 00:10   Ct Abdomen Pelvis W Contrast  Result Date: 06/01/2018 CLINICAL DATA:  57 year old female with trauma and left-sided pain. EXAM: CT CHEST, ABDOMEN, AND PELVIS WITH CONTRAST TECHNIQUE: Multidetector CT imaging of the chest, abdomen and pelvis was performed following the standard protocol during bolus administration of intravenous contrast. CONTRAST:  ISOVUE-300 IOPAMIDOL (ISOVUE-300) INJECTION 61% COMPARISON:  Chest CT dated 04/10/2018 FINDINGS: CT CHEST FINDINGS Cardiovascular: There is no cardiomegaly or pericardial effusion. The thoracic aorta is unremarkable. The visualized origins of the great vessels of the aortic arch appear patent. The central pulmonary arteries appear patent. Mediastinum/Nodes: There is no hilar or mediastinal adenopathy. There is a moderate size hiatal hernia. The esophagus and the thyroid gland are grossly unremarkable. No mediastinal fluid collection. Lungs/Pleura: Bilateral lower lobe linear atelectasis/scarring as well as posterior subpleural dependent atelectatic changes noted. A patchy area of linear opacity in the lingula, likely atelectatic changes. Pneumonia, although less likely is not excluded. Clinical correlation is recommended. There is no pleural effusion or pneumothorax. The central airways are patent. Musculoskeletal: Acute minimally angulated fracture of the left anterior fifth rib. Multiple additional subacute or old fractures involving the lateral aspect of the left ribs including tenth and eleventh rib. Old-appearing left seventh rib fracture. There is progression of T8 compression fracture compared to the prior CT with near complete loss of  vertebral body height centrally and mild buckling of the posterior cortex. No retropulsed fragment. T4 compression fracture with approximately 20% loss of vertebral body height centrally similar to prior CT. Similar appearance of T8 superior endplate compression fracture. There is acute fracture of the T7 spinous process. Old healed sternal manubrium fractures. CT ABDOMEN PELVIS FINDINGS No intra-abdominal free air or free fluid. Hepatobiliary: No focal liver abnormality is seen. No gallstones, gallbladder wall thickening, or biliary dilatation. Pancreas: Scattered coarse calcification of the pancreas, likely sequela of chronic pancreatitis. Mild peripancreatic haziness may represent an acute on chronic pancreatitis. Correlation with pancreatic enzymes recommended. There is dilatation of the main pancreatic duct with moderate associated gland atrophy. The pancreatic duct measures up to 6 mm in diameter. There is an 8 mm calcific focus in the region of the pancreatic neck along the course of the pancreatic duct similar to prior CT and concerning for an occlusive stone within the duct. Further evaluation with MRCP recommended. No fluid collection or abscess. Spleen: Normal in size without focal abnormality. Adrenals/Urinary Tract: The  adrenal glands are unremarkable. There is no hydronephrosis on either side. There is symmetric enhancement and excretion of contrast by both kidneys. The visualized ureters and urinary bladder appear unremarkable. Stomach/Bowel: Moderate size hiatal hernia. There is no bowel obstruction or active inflammation. The appendix is normal. Vascular/Lymphatic: Mild aortoiliac atherosclerotic disease. No portal venous gas. There is no adenopathy. Reproductive: The uterus is grossly unremarkable as visualized. No pelvic mass. Other: None Musculoskeletal: Osteopenia. L2 compression fracture, new since the CT of 07/30/2017. With anterior wedging and approximately 40% loss of vertebral body height  anteriorly. A small triangular fragment noted at the anterior superior endplate. IMPRESSION: 1. Acute minimally angulated fracture of the left anterior fifth rib. Acute fracture of the T7 spinous process. Multiple subacute or old left rib fractures. No pneumothorax. 2. Progression of the T8 compression fracture compared to the prior CT. 3. Age indeterminate L2 compression fracture, new since the prior CT. 4. Probable acute on chronic pancreatitis. Correlation with pancreatic enzymes recommended. A 8 mm calcific focus in the region of the pancreatic neck concerning for an occlusive stone within the duct. Further evaluation with MRCP recommended. Electronically Signed   By: Elgie Collard M.D.   On: 06/01/2018 00:10   Dg Shoulder Left  Result Date: 05/31/2018 CLINICAL DATA:  Fall with shoulder pain EXAM: LEFT SHOULDER - 2+ VIEW COMPARISON:  None. FINDINGS: Old left fifth and sixth rib fractures. AC joint is intact. No fracture or dislocation. IMPRESSION: No acute osseous abnormality Electronically Signed   By: Jasmine Pang M.D.   On: 05/31/2018 22:36   Dg Hip Unilat W Or Wo Pelvis 2-3 Views Left  Result Date: 05/31/2018 CLINICAL DATA:  Fall with hip pain EXAM: DG HIP (WITH OR WITHOUT PELVIS) 2-3V LEFT COMPARISON:  CT 08/09/2017 FINDINGS: SI joints are non widened. Pubic symphysis and rami are intact. No fracture or dislocation. IMPRESSION: No acute osseous abnormality. Electronically Signed   By: Jasmine Pang M.D.   On: 05/31/2018 22:37     Subjective:   Discharge Exam: Vitals:   06/01/18 1947 06/02/18 0559  BP: 119/80 135/87  Pulse: (!) 103 93  Resp: 19 19  Temp: 98.1 F (36.7 C) 98.4 F (36.9 C)  SpO2: 95% 96%   Vitals:   06/01/18 1541 06/01/18 1947 06/01/18 2100 06/02/18 0559  BP: (!) 134/93 119/80  135/87  Pulse: 98 (!) 103  93  Resp: 17 19  19   Temp: (!) 97.5 F (36.4 C) 98.1 F (36.7 C)  98.4 F (36.9 C)  TempSrc:  Oral  Oral  SpO2: 95% 95%  96%  Weight:      Height:    5\' 3"  (1.6 m)     General: Pt is alert, awake, not in acute distress Cardiovascular: RRR, S1/S2 +, no rubs, no gallops Respiratory: CTA bilaterally, no wheezing, no rhonchi, mild tenderness over ribs Abdominal: Soft, NT, ND, bowel sounds + Extremities: no edema    The results of significant diagnostics from this hospitalization (including imaging, microbiology, ancillary and laboratory) are listed below for reference.     Microbiology: No results found for this or any previous visit (from the past 240 hour(s)).   Labs: BNP (last 3 results) Recent Labs    04/10/18 0857  BNP 15.5   Basic Metabolic Panel: Recent Labs  Lab 05/31/18 2238 05/31/18 2244 06/01/18 0441 06/02/18 0611  NA 140 139 141 140  K 3.5 3.8 3.3* 3.1*  CL 108 107 112* 111  CO2 22  --  21*  17*  GLUCOSE 113* 107* 102* 94  BUN 9 9 8 6   CREATININE 0.71 0.70 0.61 0.59  CALCIUM 8.1*  --  7.7* 7.7*   Liver Function Tests: Recent Labs  Lab 05/31/18 2238 06/02/18 0611  AST 14* 13*  ALT 11 9  ALKPHOS 201* 171*  BILITOT 0.5 0.5  PROT 5.9* 5.3*  ALBUMIN 2.6* 2.3*   Recent Labs  Lab 05/31/18 2238 06/02/18 0611  LIPASE 30 21   Recent Labs  Lab 06/01/18 0105  AMMONIA 24   CBC: Recent Labs  Lab 05/31/18 2238 05/31/18 2244 06/01/18 0441 06/02/18 0611  WBC 7.2  --  5.4 5.9  NEUTROABS 4.4  --   --   --   HGB 10.8* 12.2 10.6* 10.3*  HCT 34.1* 36.0 33.5* 33.6*  MCV 100.9*  --  99.1 103.7*  PLT 234  --  250 226   Cardiac Enzymes: No results for input(s): CKTOTAL, CKMB, CKMBINDEX, TROPONINI in the last 168 hours. BNP: Invalid input(s): POCBNP CBG: No results for input(s): GLUCAP in the last 168 hours. D-Dimer No results for input(s): DDIMER in the last 72 hours. Hgb A1c No results for input(s): HGBA1C in the last 72 hours. Lipid Profile No results for input(s): CHOL, HDL, LDLCALC, TRIG, CHOLHDL, LDLDIRECT in the last 72 hours. Thyroid function studies No results for input(s): TSH,  T4TOTAL, T3FREE, THYROIDAB in the last 72 hours.  Invalid input(s): FREET3 Anemia work up No results for input(s): VITAMINB12, FOLATE, FERRITIN, TIBC, IRON, RETICCTPCT in the last 72 hours. Urinalysis    Component Value Date/Time   COLORURINE YELLOW 06/01/2018 0110   APPEARANCEUR CLEAR 06/01/2018 0110   LABSPEC >1.046 (H) 06/01/2018 0110   PHURINE 6.0 06/01/2018 0110   GLUCOSEU NEGATIVE 06/01/2018 0110   HGBUR NEGATIVE 06/01/2018 0110   BILIRUBINUR NEGATIVE 06/01/2018 0110   KETONESUR 5 (A) 06/01/2018 0110   PROTEINUR NEGATIVE 06/01/2018 0110   UROBILINOGEN 0.2 09/13/2012 0124   NITRITE NEGATIVE 06/01/2018 0110   LEUKOCYTESUR NEGATIVE 06/01/2018 0110   Sepsis Labs Invalid input(s): PROCALCITONIN,  WBC,  LACTICIDVEN Microbiology No results found for this or any previous visit (from the past 240 hour(s)).   Time coordinating discharge: 35  SIGNED:   Delaine LameShrey C Esmay Amspacher, MD  [facility] 06/02/2018, 10:38 AM  If 7PM-7AM, please contact night-coverage [email or web address] Password [email or web address]

## 2018-06-02 NOTE — Progress Notes (Signed)
Catherine Butler to be D/C'd Home per MD order.  Discussed prescriptions and follow up appointments with the patient. Prescriptions given to patient, medication list explained in detail. Pt verbalized understanding.  Allergies as of 06/02/2018      Reactions   Influenza Vaccines Other (See Comments)   Patient got Guillain Barre   Lipitor [atorvastatin] Other (See Comments)   "really bad leg pain"      Medication List    STOP taking these medications   levofloxacin 500 MG tablet Commonly known as:  LEVAQUIN     TAKE these medications   acetaminophen 500 MG tablet Commonly known as:  TYLENOL Take 1,000 mg by mouth 2 (two) times daily as needed for headache.   albuterol 108 (90 Base) MCG/ACT inhaler Commonly known as:  PROVENTIL HFA;VENTOLIN HFA Inhale 2 puffs into the lungs every 6 (six) hours as needed for wheezing.   aspirin EC 81 MG tablet Take 1 tablet (81 mg total) by mouth daily.   benzonatate 200 MG capsule Commonly known as:  TESSALON Take 1 capsule (200 mg total) by mouth at bedtime.   DULoxetine 30 MG capsule Commonly known as:  CYMBALTA TAKE 1 CAPSULE BY MOUTH ONCE DAILY What changed:  when to take this   esomeprazole 40 MG packet Commonly known as:  NEXIUM Take 40 mg by mouth daily before breakfast.   gabapentin 600 MG tablet Commonly known as:  NEURONTIN TAKE 2 TABLETS BY MOUTH 3 TIMES DAILY What changed:  See the new instructions.   hydrOXYzine 25 MG tablet Commonly known as:  ATARAX/VISTARIL Take 1 tablet (25 mg total) by mouth at bedtime.   ibuprofen 200 MG tablet Commonly known as:  ADVIL,MOTRIN Take 400 mg by mouth every 6 (six) hours as needed for fever, headache, mild pain, moderate pain or cramping.   lidocaine 5 % Commonly known as:  LIDODERM Place 1 patch onto the skin daily. Remove & Discard patch within 12 hours or as directed by MD Start taking on:  June 03, 2018   linaclotide 290 MCG Caps capsule Commonly known as:  LINZESS Take  1 capsule (290 mcg total) by mouth daily before breakfast.   nicotine 21 mg/24hr patch Commonly known as:  NICODERM CQ - dosed in mg/24 hours Place 1 patch (21 mg total) onto the skin daily.   QUEtiapine 400 MG tablet Commonly known as:  SEROQUEL Take 400 mg by mouth at bedtime.       Vitals:   06/01/18 1947 06/02/18 0559  BP: 119/80 135/87  Pulse: (!) 103 93  Resp: 19 19  Temp: 98.1 F (36.7 C) 98.4 F (36.9 C)  SpO2: 95% 96%    Skin clean, dry and intact without evidence of skin break down, no evidence of skin tears noted. IV catheter discontinued intact. Site without signs and symptoms of complications. Dressing and pressure applied. Pt denies pain at this time. No complaints noted.  An After Visit Summary was printed and given to the patient. Patient escorted via WC, and D/C home via private auto.  Viviana SimplerDumas, Louie Meaders S 06/02/2018 11:17 AM

## 2018-06-02 NOTE — Care Management Note (Signed)
Case Management Note  Patient Details  Name: Catherine Butler MRN: [id number] Date of Birth: 12/24/1960  Subjective/Objective:                  discharged  Action/Plan: [name] -pt and ot [facility] and used in the past.  Representative [name] notified.  CMS choices placed on shadow chart.  Expected Discharge Date:  06/02/18               Expected Discharge Plan:  Home w Home Health Services  In-House Referral:     Discharge planning Services  CM Consult  Post Acute Care Choice:    Choice offered to:  Patient  DME Arranged:    DME Agency:     HH Arranged:  PT, OT HH Agency:  [facility]  Status of Service:  Completed, signed off  If discussed at Long Length of Stay Meetings, dates discussed:    Additional Comments:  Golda AcreDavis, Aren Pryde Lynn, RN 06/02/2018, 11:37 AM

## 2018-06-05 ENCOUNTER — Telehealth (INDEPENDENT_AMBULATORY_CARE_PROVIDER_SITE_OTHER): Payer: Self-pay | Admitting: Physician Assistant

## 2018-06-05 NOTE — Telephone Encounter (Signed)
[name] physical therapist from [facility] called to request verbal orders for physical therapy for 3x a week for 1 week and 2x a week for 1 week. [name] also asked for verbal orders for a skill nurse to come out an evaluate Ms.Elsen for her pneumonia since she heard abnormal lung sounds.  Front desk gave verbal orders and [name] provided her cell phone number in case she needed to be reached [phone]  Thank you Louisa SecondGloria I Vargas Hernandez

## 2018-06-05 NOTE — Telephone Encounter (Signed)
Ok , approved

## 2018-06-06 ENCOUNTER — Other Ambulatory Visit (INDEPENDENT_AMBULATORY_CARE_PROVIDER_SITE_OTHER): Payer: Self-pay | Admitting: Physician Assistant

## 2018-06-06 DIAGNOSIS — R059 Cough, unspecified: Secondary | ICD-10-CM

## 2018-06-06 DIAGNOSIS — R05 Cough: Secondary | ICD-10-CM

## 2018-06-06 MED ORDER — BENZONATATE 200 MG PO CAPS: 200.0000 mg | ORAL_CAPSULE | Freq: Every day | ORAL | 0 refills | Status: DC

## 2018-06-06 NOTE — Telephone Encounter (Signed)
Byrd HesselbachMaria from [facility] is requesting verbal orders for skill nursing since [name] is out of the hospital with rib fractures.  Byrd HesselbachMaria wants to do skill nursing for 1x a week for 9 weeks.   Front desk gave verbal. Byrd HesselbachMaria provided her number in case she needed to be reached [phone]  Byrd HesselbachMaria also wanted to inform that she is not taking   benzonatate (TESSALON) 200 MG capsule   But still has a cough and is coughing up brown/yellow flame.  Patient is taking Remeron 15mg  before bedtime and wanted to inform PCP.  Thank you Catherine Butler

## 2018-06-06 NOTE — Telephone Encounter (Signed)
I have reordered Catherine Butler in case she has run out. Pt is to go to emergency room should she cough up blood, run a fever, have chills, or has difficulty breathing.

## 2018-06-06 NOTE — Telephone Encounter (Signed)
Patient is aware that tessalon has be refilled. Patient has been advised to go to ED should she began coughing up blood, have fever/chills or difficulty breathing. Maryjean Mornempestt S Roberts, CMA

## 2018-06-07 ENCOUNTER — Other Ambulatory Visit (INDEPENDENT_AMBULATORY_CARE_PROVIDER_SITE_OTHER): Payer: Self-pay | Admitting: Physician Assistant

## 2018-06-07 DIAGNOSIS — R202 Paresthesia of skin: Secondary | ICD-10-CM

## 2018-06-07 NOTE — Telephone Encounter (Signed)
FWD to PCP. Olar Santini S Kery Batzel, CMA  

## 2018-06-15 ENCOUNTER — Telehealth (INDEPENDENT_AMBULATORY_CARE_PROVIDER_SITE_OTHER): Payer: Self-pay | Admitting: Physician Assistant

## 2018-06-15 NOTE — Telephone Encounter (Signed)
Noted  

## 2018-06-15 NOTE — Telephone Encounter (Signed)
FWD to PCP. Deonne Rooks S Kirstina Leinweber, CMA  

## 2018-06-15 NOTE — Telephone Encounter (Signed)
Kat from [facility] stated she went to evaluate Ms.Boehlke on Tuesday Dec 24 and she was doing well on her own and will discontinue her services.   Georgiann HahnKat [phone] (If PCP had any questions)  Thank you  Louisa SecondGloria I Vargas Hernandez

## 2018-06-19 ENCOUNTER — Other Ambulatory Visit (INDEPENDENT_AMBULATORY_CARE_PROVIDER_SITE_OTHER): Payer: Self-pay | Admitting: Physician Assistant

## 2018-06-19 DIAGNOSIS — R202 Paresthesia of skin: Secondary | ICD-10-CM

## 2018-06-19 NOTE — Telephone Encounter (Signed)
FWD to PCP. Skarlett Sedlacek S Pleas Carneal, CMA  

## 2018-07-04 ENCOUNTER — Inpatient Hospital Stay (INDEPENDENT_AMBULATORY_CARE_PROVIDER_SITE_OTHER): Payer: Medicaid Other | Admitting: Family Medicine

## 2018-07-05 ENCOUNTER — Other Ambulatory Visit: Payer: Self-pay | Admitting: Gastroenterology

## 2018-07-05 ENCOUNTER — Other Ambulatory Visit: Payer: Self-pay | Admitting: Nurse Practitioner

## 2018-07-11 ENCOUNTER — Ambulatory Visit: Payer: Medicaid Other | Admitting: Nurse Practitioner

## 2018-07-11 ENCOUNTER — Encounter: Payer: Self-pay | Admitting: Nurse Practitioner

## 2018-07-11 VITALS — BP 120/70 | HR 76 | Ht 66.0 in | Wt 152.0 lb

## 2018-07-11 DIAGNOSIS — K5909 Other constipation: Secondary | ICD-10-CM

## 2018-07-11 MED ORDER — LUBIPROSTONE 24 MCG PO CAPS: 24.0000 ug | ORAL_CAPSULE | Freq: Two times a day (BID) | ORAL | 5 refills | Status: DC

## 2018-07-11 NOTE — Progress Notes (Signed)
Chief Complaint:  Recurrent constipation.     IMPRESSION and PLAN:    1. [age] female with chronic constipation, which is probably medication induced.  Normal colonoscopy late January 2019.  On linaclotide 290 mcg daily, having only one BM a week. Stool is hard. Drinks ~ 48 oz of water / daily. Takes stool softeners.  -We will try changing her to Amitiza 24 mcg twice daily.  If this is not covered by insurance then she can continue the linaclotide 290 mcg and try adding back daily MiraLAX.  -increase water intake to at least 64 oz daily.     2 Abnormal pancreas on CTAP w/ contrast.  No abdominal pain today I just noticed that this was found during hospitalization last month.  CT scan suggested possible acute on chronic calcific pancreatitis, she does have a history of alcohol abuse.  CT scan also suggested an 8 mm calcific focus at the pancreatic neck concerning for stone.  Patient was unable to tolerate an MRCP at the time. - I will ask her primary GI, Dr. Christella HartiganJacobs to review the scan to see if he has any concerns warranting further work-up  HPI:     Patient is a 58 year old female known to Dr. Christella HartiganJacobs.  She has a history of Guillain Barre, COPD, depression with suicide attempts, alcohol abuse and chronic constipation.  Colonoscopy 07/21/17 was normal (done to evaluate ? Colon mass on CT scan)..   Patient was admitted in December with falls / new L2 compression fracture, progression of T8 compression fx,  as well as abdominal pain. CTAP w/ contrast raised concern for acute on chronic calcific pancreatitis .  Main pancreatic duct was dilated, pancreas with moderate atrophy.  . There was an 8 mm calcific focus in the region of the pancreatic neck along the course of the pancreatic duct concerning for stones within the duct.  MRCP was ordered but patient cannot tolerate it.  SNF recommended at time of discharge but patient declined.       For chronic constipation patient has tried Miralax in the  past. In March she was started on Linzess 290 mcg but it caused some urgency / liquid stool so we decreased the dose to 145 mcg. We recommended she continue fiber supplements as well.   Catherine Butler is back with complaints of recurrent constipation defined as hard, infrequent stools.  She is a poor historian, cannot remember what happened after we decreased the dose of linaclotide .  Not sure why but she is back on to 290 mcg again. She averages 1 bowel movement a week.  Not on fiber, does not recall us ever asking her to take fiber but thought we recommended stools softeners which she takes about twice a week. She drinks 3-4 16 oz bottles of water /daily. No abdominal pain. She as chronic nausea  Catherine Butler has nicotine patches, hasn't started them. Plans to start patches on her birthday early next month.   Review of systems:     No chest pain, no SOB, no fevers, no urinary sx   Past Medical History:  Diagnosis Date  . Anxiety   . Arthritis   . Asthma   . Complication of anesthesia    nausea  . COPD (chronic obstructive pulmonary disease) (HCC)   . Depression   . Guillain-Barre (HCC) 2018  . H/O alcohol abuse   . Hypertension    pt denies  . PONV (postoperative nausea and vomiting)   . Reflux   .  Suicide attempt by multiple drug overdose 2014    Patient's surgical history, family medical history, social history, medications and allergies were all reviewed in Epic   Creatinine clearance cannot be calculated (Patient's most recent lab result is older than the maximum 21 days allowed.)  Current Outpatient Medications  Medication Sig Dispense Refill  . acetaminophen (TYLENOL) 500 MG tablet Take 1,000 mg by mouth 2 (two) times daily as needed for headache.    . albuterol (PROVENTIL HFA;VENTOLIN HFA) 108 (90 Base) MCG/ACT inhaler Inhale 2 puffs into the lungs every 6 (six) hours as needed for wheezing. 1 Inhaler 11  . aspirin EC 81 MG tablet Take 1 tablet (81 mg total) by mouth daily. 90 tablet 3    . DULoxetine (CYMBALTA) 30 MG capsule TAKE 1 CAPSULE BY MOUTH ONCE DAILY 30 capsule 3  . esomeprazole (NEXIUM) 40 MG packet Take 40 mg by mouth daily before breakfast. 30 each 11  . gabapentin (NEURONTIN) 600 MG tablet TAKE 2 TABLETS BY MOUTH 3 TIMES DAILY 180 tablet 2  . hydrOXYzine (ATARAX/VISTARIL) 25 MG tablet Take 1 tablet (25 mg total) by mouth at bedtime. 30 tablet 1  . ibuprofen (ADVIL,MOTRIN) 200 MG tablet Take 400 mg by mouth every 6 (six) hours as needed for fever, headache, mild pain, moderate pain or cramping.    . linaclotide (LINZESS) 290 MCG CAPS capsule Take 1 capsule (290 mcg total) by mouth daily before breakfast. 30 capsule 3  . nicotine (NICODERM CQ - DOSED IN MG/24 HOURS) 21 mg/24hr patch Place 1 patch (21 mg total) onto the skin daily. 28 patch 1  . QUEtiapine (SEROQUEL) 400 MG tablet Take 400 mg by mouth at bedtime.     No current facility-administered medications for this visit.     Physical Exam:     BP 120/70   Pulse 76   Ht 5\' 6"  (1.676 m)   Wt 152 lb (68.9 kg)   LMP 02/22/2006   BMI 24.53 kg/m   GENERAL:  Pleasant female in NAD PSYCH: : Cooperative, normal affect EENT:  conjunctiva pink, mucous membranes moist, neck supple without masses CARDIAC:  RRR, no murmur heard, no peripheral edema PULM: Normal respiratory effort, lungs CTA bilaterally, no wheezing ABDOMEN:  Nondistended, soft, nontender. No obvious masses, no hepatomegaly,  normal bowel sounds SKIN:  turgor, no lesions seen Musculoskeletal:  Normal muscle tone, normal strength NEURO: Alert and oriented x 3, no focal neurologic deficits   Catherine Butler , NP 07/11/2018, 1:49 PM

## 2018-07-11 NOTE — Patient Instructions (Addendum)
If you are age 56 or older, your body mass index should be between 23-30. Your Body mass index is 24.53 kg/m. If this is out of the aforementioned range listed, please consider follow up with your Primary Care Provider.  If you are age 22 or younger, your body mass index should be between 19-25. Your Body mass index is 24.53 kg/m. If this is out of the aformentioned range listed, please consider follow up with your Primary Care Provider.   We have sent the following medications to your pharmacy for you to pick up at your convenience: Amitiza 24 mcg twice daily.  STOP Linzess.  INCREASE water to 64 ounces daily.  Follow up as needed.  Thank you for choosing me and [facility].   Willette Cluster, NP

## 2018-07-12 NOTE — Progress Notes (Signed)
I agree with the above note, plan.  Catherine Butler, she's had MRIs in the past couple years (back, head) and so I'm not sure why she could not tolerated MRI abd with MRCP.  Does she need oral benzo to help her get through it?

## 2018-07-21 ENCOUNTER — Telehealth: Payer: Self-pay | Admitting: Nurse Practitioner

## 2018-07-21 NOTE — Telephone Encounter (Signed)
Pt states that Amitiza does not work for her and is causing side effects. She is requesting for a different med. She uses [facility].

## 2018-07-25 ENCOUNTER — Ambulatory Visit (INDEPENDENT_AMBULATORY_CARE_PROVIDER_SITE_OTHER): Payer: Medicaid Other | Admitting: Family Medicine

## 2018-07-25 ENCOUNTER — Other Ambulatory Visit: Payer: Self-pay

## 2018-07-25 ENCOUNTER — Encounter (INDEPENDENT_AMBULATORY_CARE_PROVIDER_SITE_OTHER): Payer: Self-pay | Admitting: Family Medicine

## 2018-07-25 VITALS — BP 136/85 | HR 114 | Temp 97.5°F | Ht 66.0 in | Wt 150.8 lb

## 2018-07-25 DIAGNOSIS — K860 Alcohol-induced chronic pancreatitis: Secondary | ICD-10-CM

## 2018-07-25 DIAGNOSIS — M545 Low back pain, unspecified: Secondary | ICD-10-CM

## 2018-07-25 DIAGNOSIS — J441 Chronic obstructive pulmonary disease with (acute) exacerbation: Secondary | ICD-10-CM

## 2018-07-25 DIAGNOSIS — M5440 Lumbago with sciatica, unspecified side: Secondary | ICD-10-CM

## 2018-07-25 DIAGNOSIS — S2242XD Multiple fractures of ribs, left side, subsequent encounter for fracture with routine healing: Secondary | ICD-10-CM

## 2018-07-25 DIAGNOSIS — S22060D Wedge compression fracture of T7-T8 vertebra, subsequent encounter for fracture with routine healing: Secondary | ICD-10-CM

## 2018-07-25 DIAGNOSIS — Z09 Encounter for follow-up examination after completed treatment for conditions other than malignant neoplasm: Secondary | ICD-10-CM

## 2018-07-25 MED ORDER — ALBUTEROL SULFATE (2.5 MG/3ML) 0.083% IN NEBU: 2.5000 mg | INHALATION_SOLUTION | Freq: Four times a day (QID) | RESPIRATORY_TRACT | 4 refills | Status: DC | PRN

## 2018-07-25 MED ORDER — DOXYCYCLINE HYCLATE 100 MG PO TABS: 100.0000 mg | ORAL_TABLET | Freq: Two times a day (BID) | ORAL | 0 refills | Status: DC

## 2018-07-25 NOTE — Progress Notes (Signed)
Subjective:    Patient ID: Catherine Butler, female    DOB: 1960-12-03, 58 y.o.   MRN: [id number]  HPI       58 year old female with history of Guillain-Barr syndrome, COPD, alcohol abuse and chronic pancreatitis.  Patient is status post hospitalization 05/31/2018 due to a fall with rib fractures.  On CT of the abdomen and pelvis done on 05/31/2018, patient had acute minimally angulated fracture of the left anterior fifth rib and multiple subacute or old fractures involving the lateral aspect of the left ribs including 10th and 11th rib.  Patient also with an old appearing left seventh rib fracture.  Patient also had progression of a T8 compression fracture compared to her prior CT.  Patient also with T4 subacute compression fracture.  Patient with acute fracture of the T7 spinous process and old healed sternal manubrium fractures.  Patient's prior fractures and falls were thought to be secondary to patient's alcohol use.  Patient is seen at today's visit in follow-up of recent hospitalization as well as due to complaint of recent onset of productive cough.       Patient with complaint of productive cough along with wheezing for more than a week.  Patient states that her cough is productive of yellow to green sputum.  Patient states that she has a nurse that comes to her home and the nurse suggested that patient have a nebulizer to administer medication to help with her wheezing.  Patient would like to have prescription for nebulizer and albuterol to use in the nebulizer.  Patient denies fever or chills.  Patient does have mild nasal congestion, no sore throat or ear pain.  Patient does have some increase in left-sided rib pain with her coughing.  Patient states that her rib pain however is greatly improved from the time of her hospitalization in December.  Patient however continues to have mid back pain as well as lower back pain which radiates down both legs but mostly on the left.  Patient states that her  mid back pain is a dull, ache and about a 5 on a 0-to-10 scale.  Patient states that her low back pain with radiation is a 10 on a 0-to-10 scale and pain is sharp.      Patient states that she does not drink alcohol on a daily basis.  Patient states that she may occasionally have 3 alcoholic drinks per week to help with her sleep.  Per hospital discharge records, patient drinks at least a gallon of vodka weekly.  Patient states that she currently smokes about a pack of cigarettes per day.  Patient at this time is not interested in smoking cessation or alcohol cessation.      During her hospitalization, patient had findings of possible chronic pancreatitis on CT scan.  Patient states that she did see gastroenterology recently but this was concerning chronic issues with constipation and patient reports that she was given medication for the constipation however this medication did not agree with her and she is left a message for GI to call regarding a different medication.  Patient denies any blood in the stool and no dark stools and patient denies any current abdominal pain.  Past Medical History:  Diagnosis Date  . Anxiety   . Arthritis   . Asthma   . Complication of anesthesia    nausea  . COPD (chronic obstructive pulmonary disease) (HCC)   . Depression   . Guillain-Barre (HCC) 2018  . H/O alcohol abuse   .  Hypertension    pt denies  . PONV (postoperative nausea and vomiting)   . Reflux   . Suicide attempt by multiple drug overdose 2014   Past Surgical History:  Procedure Laterality Date  . COLONOSCOPY WITH PROPOFOL N/A 07/21/2017   Procedure: COLONOSCOPY WITH PROPOFOL;  Surgeon: Rachael Fee, MD;  Location: [facility];  Service: Endoscopy;  Laterality: N/A;  . LAPAROSCOPY    . laporscopy surgery for ovarian cyst  20 years ago  . TONSILLECTOMY    . TUBAL LIGATION     Family History  Problem Relation Age of Onset  . Diabetes Father   . Hypertension Father 50       heart attack    . Kidney disease Father   . Heart attack Father   . Heart failure Father   . Dementia Father   . Breast cancer Mother 30       lump removed, bile duct cancer  . COPD Mother   . Hypertension Brother   . Allergic rhinitis Paternal Grandfather    Social History   Tobacco Use  . Smoking status: Current Every Day Smoker    Packs/day: 1.00    Years: 40.00    Pack years: 40.00    Types: Cigarettes  . Smokeless tobacco: Never Used  Substance Use Topics  . Alcohol use: Yes    Frequency: Never    Comment: 1-2 glasses a month  . Drug use: No   Allergies  Allergen Reactions  . 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 Lipitor [Atorvastatin] Other (See Comments)    "really bad leg pain"    Review of Systems  Constitutional: Positive for fatigue. Negative for chills and fever.  HENT: Positive for congestion, postnasal drip and rhinorrhea. Negative for ear pain, nosebleeds, sinus pressure, sinus pain, sneezing, sore throat and trouble swallowing.   Respiratory: Positive for cough, shortness of breath and wheezing. Negative for chest tightness.   Cardiovascular: Negative for chest pain, palpitations and leg swelling.  Gastrointestinal: Positive for constipation. Negative for abdominal pain and diarrhea.  Genitourinary: Negative for dysuria and frequency.  Musculoskeletal: Positive for arthralgias, back pain, gait problem and myalgias. Negative for joint swelling.  Neurological: Negative for dizziness and light-headedness.  Hematological: Negative for adenopathy. Does not bruise/bleed easily.       Objective:   Physical Exam BP 136/85 (BP Location: Left Arm, Patient Position: Sitting, Cuff Size: Normal)   Pulse (!) 114   Temp (!) 97.5 F (36.4 C) (Oral)   Ht 5\' 6"  (1.676 m)   Wt 150 lb 12.8 oz (68.4 kg)   LMP 02/22/2006   SpO2 93%   BMI 24.34 kg/m Nurse's notes and vital signs reviewed General- well-nourished, well-developed overweight female  in no acute distress who is sitting in exam chair. ENT- TMs dull, nares with mild edema of the nasal turbinates, no discharge, patient with mild posterior pharynx/tonsillar arch edema/erythema Neck-supple, no lymphadenopathy Lungs- patient with mild coarse breath sounds, no wheezing.  No increased work of breathing, no accessory muscle use Cardiovascular-regular rate and rhythm, no tachycardia Abdomen-mild abdominal distention, soft and nontender Back- patient with mid thoracic discomfort to palpation as well as mild lumbosacral discomfort to palpation and thoracolumbar paraspinous spasm Extremities-no edema Psych- patient appears to have normal mood and judgment      Assessment & Plan:  1. COPD with acute exacerbation [facility]) Patient with COPD exacerbation and patient will be placed on doxycycline 100 mg twice  daily x2 days.  Patient was not given prednisone as she was not wheezing currently and patient has had multiple vertebral compression fractures and I suspect the patient likely has some osteoporosis due to poor dietary intake.  Patient given prescription for nebulizer as well as prescription sent into patient's pharmacy for albuterol nebulizer solution.  Patient should return to clinic if her symptoms worsen or if she does not feel that she is improving. - doxycycline (VIBRA-TABS) 100 MG tablet; Take 1 tablet (100 mg total) by mouth 2 (two) times daily.  Dispense: 20 tablet; Refill: 0 - albuterol (PROVENTIL) (2.5 MG/3ML) 0.083% nebulizer solution; Take 3 mLs (2.5 mg total) by nebulization every 6 (six) hours as needed for wheezing or shortness of breath.  Dispense: 75 mL; Refill: 4 - DME Nebulizer machine  2. Compression fracture of T8 vertebra with routine healing, subsequent encounter Patient status post hospitalization and patient was found to have a compression fracture of the T8 vertebra as well as a subacute T4 compression fracture.  Patient be referred to see if she may be a candidate  for vertebroplasty or other interventions to help with back pain secondary to compression fractures - Ambulatory referral to Orthopedic Surgery  3. Low back pain with radiation Patient with complaint of low back pain with radiation.  Patient has had recent L2 vertebral fracture which was found during her recent hospitalization.  Patient being referred to orthopedics for further follow-up - Ambulatory referral to Orthopedic Surgery  4. Hospital discharge follow-up Patient's hospital discharge summary was reviewed and pertinent information from her discharge was discussed with the patient.  Patient's falls were thought to be secondary to her alcohol use which patient denies.  Patient feels that she does not drink on a regular basis follow-up [about  5. Closed fracture of multiple ribs of left side with routine healing, subsequent encounter Patient had evidence of multiple old fractures as well as new left rib fracture on her recent CT.  Patient reports minimal pain at this time  6. Alcohol-induced chronic pancreatitis [facility](HCC) Patient status post recent GI visit secondary to constipation.  GI note was reviewed and they did make mention of abnormality seen on CT scan during patient's recent hospital admission regarding possible pancreatic stone and evidence of chronic pancreatitis however they wanted patient to follow-up with her regular gastroenterologist, Dr. Yetta BarreJones.  New referral placed - Ambulatory referral to Gastroenterology   An After Visit Summary was printed and given to the patient.  Allergies as of 07/25/2018      Reactions   Influenza Vaccines Other (See Comments)   Patient got Guillain Barre   Lipitor [atorvastatin] Other (See Comments)   "really bad leg pain"      Medication List       Accurate as of July 25, 2018  3:10 PM. Always use your most recent med list.        acetaminophen 500 MG tablet Commonly known as:  TYLENOL Take 1,000 mg by mouth 2 (two) times daily as  needed for headache.   albuterol 108 (90 Base) MCG/ACT inhaler Commonly known as:  PROVENTIL HFA;VENTOLIN HFA Inhale 2 puffs into the lungs every 6 (six) hours as needed for wheezing.   albuterol (2.5 MG/3ML) 0.083% nebulizer solution Commonly known as:  PROVENTIL Take 3 mLs (2.5 mg total) by nebulization every 6 (six) hours as needed for wheezing or shortness of breath.   aspirin EC 81 MG tablet Take 1 tablet (81 mg total) by mouth daily.  doxycycline 100 MG tablet Commonly known as:  VIBRA-TABS Take 1 tablet (100 mg total) by mouth 2 (two) times daily.   DULoxetine 30 MG capsule Commonly known as:  CYMBALTA TAKE 1 CAPSULE BY MOUTH ONCE DAILY   esomeprazole 40 MG packet Commonly known as:  NEXIUM Take 40 mg by mouth daily before breakfast.   gabapentin 600 MG tablet Commonly known as:  NEURONTIN TAKE 2 TABLETS BY MOUTH 3 TIMES DAILY   hydrOXYzine 25 MG tablet Commonly known as:  ATARAX/VISTARIL Take 1 tablet (25 mg total) by mouth at bedtime.   ibuprofen 200 MG tablet Commonly known as:  ADVIL,MOTRIN Take 400 mg by mouth every 6 (six) hours as needed for fever, headache, mild pain, moderate pain or cramping.   lubiprostone 24 MCG capsule Commonly known as:  AMITIZA Take 1 capsule (24 mcg total) by mouth 2 (two) times daily with a meal.   nicotine 21 mg/24hr patch Commonly known as:  NICODERM CQ - dosed in mg/24 hours Place 1 patch (21 mg total) onto the skin daily.   QUEtiapine 400 MG tablet Commonly known as:  SEROQUEL Take 400 mg by mouth at bedtime.            Durable Medical Equipment  (From admission, onward)         Start     Ordered   07/25/18 0000  DME Nebulizer machine    Question:  Patient needs a nebulizer to treat with the following condition  Answer:  COPD (chronic obstructive pulmonary disease) (HCC)   07/25/18 1408          Return in about 1 week (around 08/01/2018), or if symptoms worsen or fail to improve, for COPD- next week if  not better otherwise 3 month f/u.

## 2018-07-28 ENCOUNTER — Telehealth: Payer: Self-pay

## 2018-07-28 DIAGNOSIS — K861 Other chronic pancreatitis: Secondary | ICD-10-CM

## 2018-07-28 NOTE — Telephone Encounter (Signed)
-----   Message from Meredith Pel, NP sent at 07/27/2018 10:50 AM EST ----- Catherine Butler,  Please let patient know that I noticed on CT scan done during hospitalization that there was question of a stone in her pancreatic duct.  Hospitalist ordered MRCP but she could not tolerate.  I discussed this with Dr. Christella Hartigan and he agrees that she needs MRI / MRCP.  She has had MRIs before, not sure why she could not tolerate the one ordered in the hospital.  Can you discuss this with her and maybe order Ativan 0.5 mg 30 minutes prior to the study.  She would need to have a driver to take her to the study.Thanks

## 2018-07-31 NOTE — Telephone Encounter (Signed)
Spoke with the patient. She is willing to try the imaging again with Ativan in an "open MRI." [facility] contacted. They will call her and schedule the appointment.  Ativan 0.5 mg to take 30 minutes prior to procedure time must be transmitted to [facility]. Gunnar Fusi is aware of this and will work on it.

## 2018-08-01 NOTE — Telephone Encounter (Signed)
Malachi Bonds, let her try Linzess 145 mcg on empty stomach. It may caused diarrhea at first but ask her to stick with it for a while. Thanks

## 2018-08-02 ENCOUNTER — Other Ambulatory Visit: Payer: Self-pay | Admitting: Nurse Practitioner

## 2018-08-02 ENCOUNTER — Ambulatory Visit (INDEPENDENT_AMBULATORY_CARE_PROVIDER_SITE_OTHER): Payer: Self-pay | Admitting: Family Medicine

## 2018-08-02 MED ORDER — LORAZEPAM 0.5 MG PO TABS: 0.5000 mg | ORAL_TABLET | Freq: Once | ORAL | 0 refills | Status: AC

## 2018-08-07 ENCOUNTER — Telehealth: Payer: Self-pay

## 2018-08-07 ENCOUNTER — Other Ambulatory Visit: Payer: Self-pay

## 2018-08-07 MED ORDER — LINACLOTIDE 145 MCG PO CAPS: 145.0000 ug | ORAL_CAPSULE | Freq: Every day | ORAL | 0 refills | Status: DC

## 2018-08-07 NOTE — Telephone Encounter (Signed)
Left message on pts voicemail that Linzess has been sent to her pharmacy. To call back if she has any questions.

## 2018-08-08 ENCOUNTER — Ambulatory Visit (INDEPENDENT_AMBULATORY_CARE_PROVIDER_SITE_OTHER): Payer: Medicaid Other | Admitting: Family Medicine

## 2018-08-08 ENCOUNTER — Encounter (INDEPENDENT_AMBULATORY_CARE_PROVIDER_SITE_OTHER): Payer: Self-pay | Admitting: Family Medicine

## 2018-08-08 DIAGNOSIS — G8929 Other chronic pain: Secondary | ICD-10-CM

## 2018-08-08 DIAGNOSIS — E559 Vitamin D deficiency, unspecified: Secondary | ICD-10-CM

## 2018-08-08 DIAGNOSIS — M545 Low back pain, unspecified: Secondary | ICD-10-CM

## 2018-08-08 DIAGNOSIS — K219 Gastro-esophageal reflux disease without esophagitis: Secondary | ICD-10-CM | POA: Diagnosis not present

## 2018-08-08 DIAGNOSIS — D649 Anemia, unspecified: Secondary | ICD-10-CM

## 2018-08-08 MED ORDER — MAGNESIUM OXIDE -MG SUPPLEMENT 400 (240 MG) MG PO TABS: 1.0000 | ORAL_TABLET | Freq: Two times a day (BID) | ORAL | 6 refills | Status: DC

## 2018-08-08 NOTE — Progress Notes (Signed)
  Catherine Butler - 58 y.o. female MRN [id number]  Date of birth: 05-19-1961    SUBJECTIVE:      Chief Complaint: Low back pain and leg numbness.  HPI:  Catherine Butler is a 58 year old female who presents with around 2 years of low back pain.  She reports lower midline back pain.  She denies any specific injury.  Her pain does not radiate.  It is made worse with prolonged standing, approximate over 5 minutes at which time she has to sit for relief.  She denies worsening pain with flexion-extension.  She states she does not use any regular prescription or over-the-counter medications to treat her back pain.  She denies feeling any focal weakness.  No bowel or bladder symptoms associated.  She does report having numbness from the mid tibia to the toes bilaterally.  She has previously been diagnosed with peripheral neuropathy.  She currently takes gabapentin 1200 mg 3 times daily which she does not find beneficial. Of note, she also has a history of Guyon Barr in 2018.   ROS:     See HPI  PERTINENT  PMH / PSH FH / / SH:  Past Medical, Surgical, Social, and Family History Reviewed & Updated in the EMR.  Pertinent findings include:  Guyon Barr in 2018 Peripheral neuropathy  History of lumbar and thoracic compression fracture Long duration of PPI use     OBJECTIVE: LMP 02/22/2006   Physical Exam:  Vital signs are reviewed.  GEN: Alert and oriented, NAD Pulm: Breathing unlabored PSY: normal mood, congruent affect  MSK: Lumbar spine: - Inspection: Increased kyphotic curve of the thoracic spine.  Otherwise, no gross deformity or asymmetry, swelling or ecchymosis - Palpation: No TTP over the spinous processes, paraspinal muscles, or SI joints b/l - ROM: Range of motion is grossly limited lumbar spine but no significant pain. - Strength: 4+/5 strength of lower extremity in L4-S1 nerve root distributions b/l - Neuro: sensation intact in the L4-S1 nerve root distribution b/l, 1+ L4 and S1 reflexes  symmetric bilaterally - Special testing: Negative straight leg raise  Right YDX:AJOI range of motion of the hip without pain Left hip: Full range of motion of the hip without pain    ASSESSMENT & PLAN:  1.  Low back pain-this is likely secondary to muscular strain and fatigue.  MRI from 2018 was reviewed today and shows no concerning bony abnormalities or significant degenerative changes.  Her pain does not appear to be neuropathic as it relates to her lumbar spine.  No red flag symptoms.  In general, her laboratory results historically pain a picture of undernourishment. -We will refer to physical therapy   2.  Peripheral neuropathy- the numbness she is experiencing in her lower extremities is unrelated to her low back.  She does have a history of of a previously diagnosed peripheral neuropathy.  Currently she feels is not getting good relief from the gabapentin or Cymbalta. -We will obtain magnesium and B12 levels to further evaluate.  Her prolonged use (many years) of a PPI increases her risk of B12 absorption.  Additionally, she has a previously documented history of hypomagnesemia. -We will also obtain vitamin D and ferritin.  Patient does have a history of chronic anemia. - f/u 4 weeks

## 2018-08-08 NOTE — Progress Notes (Signed)
I saw and examined the patient with Dr. Jamse Mead and agree with assessment and plan as outlined.  Mechanical low back pain with unremarkable MRI scan in 2018.  Labs are notable for anemia.  She is on chronic proton pump inhibitors increasing her chance of becoming B12 and magnesium deficient.  There is also a good chance she is vitamin D deficient.  She is a chronic smoker as well.  We will draw some additional labs, refer her to physical therapy for her to physical therapy, and encourage her to quit smoking.

## 2018-08-09 ENCOUNTER — Inpatient Hospital Stay: Admission: RE | Admit: 2018-08-09 | Payer: Medicaid Other | Source: Ambulatory Visit

## 2018-08-09 ENCOUNTER — Telehealth (INDEPENDENT_AMBULATORY_CARE_PROVIDER_SITE_OTHER): Payer: Self-pay | Admitting: Family Medicine

## 2018-08-09 LAB — FERRITIN: Ferritin: 13 ng/mL — ABNORMAL LOW (ref 16–232)

## 2018-08-09 LAB — VITAMIN B12: Vitamin B-12: 207 pg/mL (ref 200–1100)

## 2018-08-09 LAB — VITAMIN D 25 HYDROXY (VIT D DEFICIENCY, FRACTURES): VIT D 25 HYDROXY: 4 ng/mL — AB (ref 30–100)

## 2018-08-09 LAB — MAGNESIUM: Magnesium: 1.6 mg/dL (ref 1.5–2.5)

## 2018-08-09 MED ORDER — FERROUS SULFATE 325 (65 FE) MG PO TABS: 325.0000 mg | ORAL_TABLET | Freq: Every day | ORAL | 6 refills | Status: DC

## 2018-08-09 MED ORDER — CYANOCOBALAMIN 1000 MCG/ML IJ SOLN: INTRAMUSCULAR | 6 refills | Status: DC

## 2018-08-09 NOTE — Telephone Encounter (Signed)
B12 and ferritin are low, so will treat accordingly.

## 2018-08-09 NOTE — Telephone Encounter (Signed)
Labs are notable for the following:  Vitamin D is severely low at 4.  Target level is 50-80.  This can be associated with osteoporosis, musculoskeletal pain, decreased immune function, and other problems.  I recommend taking vitamin D3, 5000 IU tablets, 2 of them daily for the next 3 months and then recheck the levels.  We will determine what dosage she will need to take on a daily basis long-term.  Magnesium is low as well.  Treating this can improve blood pressure control, decrease muscle cramps, and help in other ways.  I have already sent in a dosage of 400 mg twice daily.  We will recheck this in 3 months as well.  2 other results are still pending.

## 2018-08-10 ENCOUNTER — Other Ambulatory Visit (INDEPENDENT_AMBULATORY_CARE_PROVIDER_SITE_OTHER): Payer: Self-pay | Admitting: Family Medicine

## 2018-08-10 MED ORDER — VITAMIN D-3 125 MCG (5000 UT) PO TABS: ORAL_TABLET | ORAL | 1 refills | Status: DC

## 2018-08-10 NOTE — Telephone Encounter (Signed)
I called the patient and notified her of all the test results. She has picked up the magnesium, iron, and vit B12 from the pharmacy already and will start this.     She is asking if the vitamin D could also be sent in to her pharmacy (cheaper for her as Rx).

## 2018-08-10 NOTE — Telephone Encounter (Signed)
Rx sent 

## 2018-08-10 NOTE — Telephone Encounter (Signed)
I called the patient - see other message regarding lab results from today.

## 2018-08-10 NOTE — Telephone Encounter (Signed)
Patient advised Rx was sent to pharmacy.

## 2018-08-15 ENCOUNTER — Other Ambulatory Visit: Payer: Self-pay | Admitting: Family Medicine

## 2018-08-15 ENCOUNTER — Other Ambulatory Visit: Payer: Self-pay | Admitting: Physician Assistant

## 2018-08-15 DIAGNOSIS — Z1231 Encounter for screening mammogram for malignant neoplasm of breast: Secondary | ICD-10-CM

## 2018-08-17 ENCOUNTER — Ambulatory Visit: Payer: Medicaid Other | Attending: Family Medicine | Admitting: Physical Therapy

## 2018-08-17 ENCOUNTER — Other Ambulatory Visit: Payer: Self-pay

## 2018-08-17 ENCOUNTER — Encounter: Payer: Self-pay | Admitting: Physical Therapy

## 2018-08-17 DIAGNOSIS — R2689 Other abnormalities of gait and mobility: Secondary | ICD-10-CM

## 2018-08-17 DIAGNOSIS — G8929 Other chronic pain: Secondary | ICD-10-CM | POA: Diagnosis present

## 2018-08-17 DIAGNOSIS — M62830 Muscle spasm of back: Secondary | ICD-10-CM | POA: Insufficient documentation

## 2018-08-17 DIAGNOSIS — M545 Low back pain: Secondary | ICD-10-CM | POA: Diagnosis not present

## 2018-08-18 ENCOUNTER — Encounter: Payer: Self-pay | Admitting: Physical Therapy

## 2018-08-18 NOTE — Therapy (Addendum)
[facility] [place], [place], [address] Phone: [phone]   Fax:  [phone]  Physical Therapy Evaluation/Discharge   Patient Details  Name: Catherine Butler MRN: [id number] Date of Birth: 1961/05/18 Referring Provider (PT): Dr Eunice Blase    Encounter Date: 08/17/2018  PT End of Session - 08/17/18 1349    Visit Number  1    Number of Visits  4    Date for PT Re-Evaluation  09/14/18    Authorization Type  Medicaid     PT Start Time  0800    PT Stop Time  0843    PT Time Calculation (min)  43 min    Activity Tolerance  Patient tolerated treatment well    Behavior During Therapy  [facility] for tasks assessed/performed       Past Medical History:  Diagnosis Date  . Anxiety   . Arthritis   . Asthma   . Complication of anesthesia    nausea  . COPD (chronic obstructive pulmonary disease) ([place])   . Depression   . Guillain-Barre ([place]) 2018  . H/O alcohol abuse   . Hypertension    pt denies  . PONV (postoperative nausea and vomiting)   . Reflux   . Suicide attempt by multiple drug overdose 2014    Past Surgical History:  Procedure Laterality Date  . COLONOSCOPY WITH PROPOFOL N/A 07/21/2017   Procedure: COLONOSCOPY WITH PROPOFOL;  Surgeon: Milus Banister, MD;  Location: [facility];  Service: Endoscopy;  Laterality: N/A;  . LAPAROSCOPY    . laporscopy surgery for ovarian cyst  20 years ago  . TONSILLECTOMY    . TUBAL LIGATION      There were no vitals filed for this visit.   Subjective Assessment - 08/17/18 0805    Subjective  Patient has a 2 year history of lower back pain. The pain increases when she stands for too long. She is able to sleep well. She has no radiation into her legs. She has bilateral numbness inboth legs. She has been diagnosed with bilateral neuropathy but she does not have diabetes.     How long can you stand comfortably?  5 min     How long can you walk comfortably?  can walk around  the gorcery stroe but she has increased pain    Diagnostic tests  MRI: 2018 Negative MRI     Patient Stated Goals  Less pain     Currently in Pain?  Yes    Pain Score  5     Pain Location  Back    Pain Orientation  Right;Left    Pain Descriptors / Indicators  Aching    Pain Onset  More than a month ago    Pain Frequency  Constant    Aggravating Factors   Standing, walking     Pain Relieving Factors  lying down;     Effect of Pain on Daily Activities  difficulty standing to perform ADL's and IAD's          [facility] PT Assessment - 08/18/18 0001      Assessment   Medical Diagnosis  Low Back Pain     Referring Provider (PT)  Dr Legrand Como Hilts     Onset Date/Surgical Date  --   2 years ago but pain has been increasing    Hand Dominance  Right    Next MD Visit  3 months     Prior Therapy  None  Precautions   Precautions  None      Restrictions   Weight Bearing Restrictions  No      Balance Screen   Has the patient fallen in the past 6 months  Yes    How many times?  --   had a fall but failed to elaborate why    Has the patient had a decrease in activity level because of a fear of falling?   Yes    Is the patient reluctant to leave their home because of a fear of falling?   Yes      Home Environment   Additional Comments  2 steps into the house       Prior Function   Level of Independence  Independent    Vocation  Unemployed    Leisure  Nothing       Cognition   Overall Cognitive Status  Within Functional Limits for tasks assessed    Attention  Focused    Focused Attention  Appears intact    Memory  Appears intact    Awareness  Appears intact    Problem Solving  Appears intact      Observation/Other Assessments   Observations  shifts frequently in her chair       Sensation   Light Touch  Appears Intact    Additional Comments  numbness into both legs       Coordination   Gross Motor Movements are Fluid and Coordinated  Yes    Fine Motor Movements are Fluid  and Coordinated  Yes      Posture/Postural Control   Posture Comments  forward head, rounded shoulder ; Significant throacic kyphosis       AROM   Lumbar Flexion  limited 25% with pain. required min a to keep balance     Lumbar Extension  no limit     Lumbar - Right Side Bend  no limit pain at end range     Lumbar - Left Side Bend  no limit pain at end range     Lumbar - Right Rotation  no limit     Lumbar - Left Rotation  pain at end range       Strength   Overall Strength Comments  decreased core strength with short crunch     Right/Left Hip  Right;Left    Right Hip Flexion  4/5    Right Hip ABduction  4/5    Right Hip ADduction  4+/5    Left Hip Flexion  4/5    Left Hip ABduction  4/5    Left Hip ADduction  4/5      Palpation   Palpation comment  spasming in bilateral parapsinals       Special Tests   Other special tests  SLR (-)       Ambulation/Gait   Gait Comments  wals with varus knees and bilateral toe out. Decreased balance upon initial gait.       High Level Balance   High Level Balance Comments  narrow base supervision; tandem stance CGA can not maintain single leg stance                 Objective measurements completed on examination: See above findings.      [facility] Adult PT Treatment/Exercise - 08/17/18 0001      Exercises   Exercises  Lumbar      Lumbar Exercises: Stretches   Active Hamstring Stretch Limitations  seated hamstring stretch  2x20sec hold       Lumbar Exercises: Standing   Other Standing Lumbar Exercises  tennis ball trigger point release       Lumbar Exercises: Supine   Other Supine Lumbar Exercises  ball squeeze with breathing x10; clamshell red abdn with breathing x10              PT Education - 08/17/18 1348    Education Details  reviewed HEP; symptom management     Person(s) Educated  Patient    Methods  Explanation;Demonstration;Tactile cues;Verbal cues    Comprehension  Returned demonstration;Verbal cues  required;Tactile cues required;Verbalized understanding       PT Short Term Goals - 08/17/18 1403      PT SHORT TERM GOAL #1   Title  Patient will increase gross bilateral lower extremity strength to 5/5     Baseline  4+/5     Time  3    Period  Weeks    Status  New      PT SHORT TERM GOAL #2   Title  Patient will demonstrate a good core contraction without cuing     Baseline  reuqires cuing for breathing and core contraction     Time  3    Period  Weeks    Status  New    Target Date  09/07/18      PT SHORT TERM GOAL #3   Title  Patient will stand for 10 minutes without pain     Baseline  5 minutes before significant increase in pain     Time  3    Period  Weeks    Status  New    Target Date  09/07/18                Plan - 08/17/18 1349    Clinical Impression Statement  Patient is a 58 year old female with a 2 year history of lower back pain. Her pain increases with standing and walking. She has spasming in her parapsinals and upper gluteals. She hs increased pain with lumbar flexion and left rotation. She has wekaness in her core muscles. Signs and symptoms are consitent with lumbar DDD. She would benefit from a core strnegthening and stretching program.     History and Personal Factors relevant to plan of care:  guillion Burre, depression, COPD    Clinical Presentation  Evolving    Clinical Presentation due to:  Long standing history of lower back pain that is increasing with less satanding     Clinical Decision Making  Moderate    Rehab Potential  Good    PT Frequency  1x / week    PT Duration  3 weeks    PT Treatment/Interventions  ADLs/Self Care Home Management;Cryotherapy;Electrical Stimulation;Moist Heat;Traction;Ultrasound;DME Instruction;Gait training;Functional mobility training;Therapeutic activities;Therapeutic exercise;Balance training;Neuromuscular re-education;Patient/family education;Manual techniques;Passive range of motion;Taping    PT Next Visit  Plan  begin light core strengthening and stretching, consider nu-step; cosnder LAD; consdeir STM tpo lumbar spine; consder standing hip exercises and standing postural correction when ready. Att this time focus on sitting and supine exercises for the core    PT Home Exercise Plan  ball squeeze with abdominal brace, hip abduction with breathing; seated hamstring stretch;     Consulted and Agree with Plan of Care  Patient       Patient will benefit from skilled therapeutic intervention in order to improve the following deficits and impairments:  Abnormal gait, Pain, Postural dysfunction, Decreased activity tolerance,  Decreased endurance, Decreased range of motion, Decreased strength, Difficulty walking  Visit Diagnosis: Chronic bilateral low back pain without sciatica  Muscle spasm of back  Other abnormalities of gait and mobility     Problem List Patient Active Problem List   Diagnosis Date Noted  . Fall 06/01/2018  . Acute metabolic encephalopathy 58/30/9407  . History of vertebral fracture 06/01/2018  . Closed fracture of spinous process of thoracic vertebra ([place]) 06/01/2018  . Thoracic compression fracture ([place]) 06/01/2018  . Lumbar compression fracture ([place]) 06/01/2018  . Closed rib fracture 06/01/2018  . Acute pancreatitis due to calculus of common bile duct 06/01/2018  . Rib pain on left side   . Cough 04/25/2018  . CAP (community acquired pneumonia) 04/10/2018  . Protein-calorie malnutrition, severe 07/21/2017  . Colonic mass 07/18/2017  . Dehydration 07/18/2017  . Hypotension 07/18/2017  . Hypomagnesemia 07/18/2017  . History of prolonged Q-T interval on ECG 07/06/2017  . Tachycardia   . Generalized anxiety disorder   . Acute lower UTI   . Debility 06/23/2017  . Constipation   . Quadriplegia and quadriparesis ([place])   . Guillain Barr syndrome ([place])   . Nausea & vomiting 06/21/2017  . Hypokalemia 06/21/2017  . Volume depletion 06/21/2017  . Sinus tachycardia  06/21/2017  . Nausea and vomiting 06/21/2017  . Essential hypertension 04/14/2017  . Paresthesia   . 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 Abdominal pain 01/01/2017  . Sepsis ([place]) 01/01/2017  . Tobacco abuse 01/01/2017  . Abnormal LFTs 01/01/2017  . Depression   . COPD (chronic obstructive pulmonary disease) ([place]) 06/16/2015   PHYSICAL THERAPY DISCHARGE SUMMARY  Visits from Start of Care: 1  Current functional level related to goals / functional outcomes:  unknown did not return 2nd to Covid-19    Remaining deficits: Unknown    Education / Equipment: Unknown   Plan: Patient agrees to discharge.  Patient goals were not met. Patient is being discharged due to not returning since the last visit.  ?????      Carney Living  PT DPT  08/18/2018, 6:47 AM  [facility] [address] [place], [place], [address] Phone: [phone]   Fax:  [phone]  Name: Catherine Butler MRN: [id number] Date of Birth: 03/06/1961

## 2018-08-29 ENCOUNTER — Ambulatory Visit: Payer: Medicaid Other | Attending: Family Medicine | Admitting: Physical Therapy

## 2018-08-29 ENCOUNTER — Telehealth: Payer: Self-pay | Admitting: Physical Therapy

## 2018-08-29 ENCOUNTER — Encounter: Payer: Self-pay | Admitting: Physical Therapy

## 2018-08-29 NOTE — Telephone Encounter (Signed)
Contacted patient regarding missed visit, She reports she was sick but did not call. Patient advised to call if she will miss her next appointment. Patient notified of her next appointment.

## 2018-08-30 ENCOUNTER — Other Ambulatory Visit: Payer: Self-pay

## 2018-08-30 ENCOUNTER — Ambulatory Visit (INDEPENDENT_AMBULATORY_CARE_PROVIDER_SITE_OTHER): Payer: Medicaid Other | Admitting: Primary Care

## 2018-08-30 ENCOUNTER — Encounter (INDEPENDENT_AMBULATORY_CARE_PROVIDER_SITE_OTHER): Payer: Self-pay | Admitting: Primary Care

## 2018-08-30 VITALS — BP 129/80 | HR 109 | Temp 97.9°F | Ht 66.0 in | Wt 150.6 lb

## 2018-08-30 DIAGNOSIS — J441 Chronic obstructive pulmonary disease with (acute) exacerbation: Secondary | ICD-10-CM

## 2018-08-30 DIAGNOSIS — Z760 Encounter for issue of repeat prescription: Secondary | ICD-10-CM | POA: Diagnosis not present

## 2018-08-30 DIAGNOSIS — R251 Tremor, unspecified: Secondary | ICD-10-CM

## 2018-08-30 DIAGNOSIS — R202 Paresthesia of skin: Secondary | ICD-10-CM | POA: Diagnosis not present

## 2018-08-30 MED ORDER — ALBUTEROL SULFATE HFA 108 (90 BASE) MCG/ACT IN AERS: 2.0000 | INHALATION_SPRAY | Freq: Four times a day (QID) | RESPIRATORY_TRACT | 11 refills | Status: AC | PRN

## 2018-08-30 MED ORDER — ALBUTEROL SULFATE (2.5 MG/3ML) 0.083% IN NEBU: 2.5000 mg | INHALATION_SOLUTION | Freq: Four times a day (QID) | RESPIRATORY_TRACT | 4 refills | Status: AC | PRN

## 2018-08-30 MED ORDER — DULOXETINE HCL 30 MG PO CPEP: 30.0000 mg | ORAL_CAPSULE | Freq: Every day | ORAL | 3 refills | Status: DC

## 2018-08-30 MED ORDER — GABAPENTIN 600 MG PO TABS: ORAL_TABLET | ORAL | 2 refills | Status: DC

## 2018-08-30 NOTE — Progress Notes (Signed)
Acute Office Visit  Subjective:    Patient ID: Catherine Butler, female    DOB: [birth year], 58 y.o.   MRN: [id number]  Chief Complaint  Patient presents with  . Tremors    both hands    HPI Patient is in today for concerns with hand tremors PMH Compression fracture of T8 vertebra , Pancreatitis , COPD and back pain.  Past Medical History:  Diagnosis Date  . Anxiety   . Arthritis   . Asthma   . Complication of anesthesia    nausea  . COPD (chronic obstructive pulmonary disease) ([facility])   . Depression   . Guillain-Barre ([facility]) 2018  . H/O alcohol abuse   . Hypertension    pt denies  . PONV (postoperative nausea and vomiting)   . Reflux   . Suicide attempt by multiple drug overdose 2014    Past Surgical History:  Procedure Laterality Date  . COLONOSCOPY WITH PROPOFOL N/A 07/21/2017   Procedure: COLONOSCOPY WITH PROPOFOL;  Surgeon: Rachael Fee, MD;  Location: [facility];  Service: Endoscopy;  Laterality: N/A;  . LAPAROSCOPY    . laporscopy surgery for ovarian cyst  20 years ago  . TONSILLECTOMY    . TUBAL LIGATION      Family History  Problem Relation Age of Onset  . Diabetes Father   . Hypertension Father 50       heart attack  . Kidney disease Father   . Heart attack Father   . Heart failure Father   . Dementia Father   . Breast cancer Mother 30       lump removed, bile duct cancer  . COPD Mother   . Hypertension Brother   . Allergic rhinitis Paternal Grandfather     Social History   Socioeconomic History  . Marital status: Single    Spouse name: Not on file  . Number of children: 1  . Years of education: Not on file  . Highest education level: Not on file  Occupational History  . Occupation: unemployed    Comment: prior Holiday representative  Social Needs  . Financial resource strain: Not on file  . Food insecurity:    Worry: Not on file    Inability: Not on file  . Transportation needs:    Medical: Not on file    Non-medical: Not on  file  Tobacco Use  . Smoking status: Current Every Day Smoker    Packs/day: 1.00    Years: 40.00    Pack years: 40.00    Types: Cigarettes  . Smokeless tobacco: Never Used  Substance and Sexual Activity  . Alcohol use: Yes    Frequency: Never    Comment: 1-2 glasses a month  . Drug use: No  . Sexual activity: Not Currently  Lifestyle  . Physical activity:    Days per week: Not on file    Minutes per session: Not on file  . Stress: Not on file  Relationships  . Social connections:    Talks on phone: Not on file    Gets together: Not on file    Attends religious service: Not on file    Active member of club or organization: Not on file    Attends meetings of clubs or organizations: Not on file    Relationship status: Not on file  . Intimate partner violence:    Fear of current or ex partner: Not on file    Emotionally abused: Not on file  Physically abused: Not on file    Forced sexual activity: Not on file  Other Topics Concern  . Not on file  Social History Narrative   ** Merged History Encounter **        Outpatient Medications Prior to Visit  Medication Sig Dispense Refill  . acetaminophen (TYLENOL) 500 MG tablet Take 1,000 mg by mouth 2 (two) times daily as needed for headache.    . albuterol (PROVENTIL HFA;VENTOLIN HFA) 108 (90 Base) MCG/ACT inhaler Inhale 2 puffs into the lungs every 6 (six) hours as needed for wheezing. 1 Inhaler 11  . albuterol (PROVENTIL) (2.5 MG/3ML) 0.083% nebulizer solution Take 3 mLs (2.5 mg total) by nebulization every 6 (six) hours as needed for wheezing or shortness of breath. 75 mL 4  . aspirin EC 81 MG tablet Take 1 tablet (81 mg total) by mouth daily. 90 tablet 3  . Cholecalciferol (VITAMIN D-3) 125 MCG (5000 UT) TABS 2 PO qd x 3 months, then 1 PO qd after that. 180 tablet 1  . cyanocobalamin (,VITAMIN B-12,) 1000 MCG/ML injection 1,000 mcg [place] qd x 1 week, then qod after that. 30 mL 6  . DULoxetine (CYMBALTA) 30 MG capsule TAKE 1  CAPSULE BY MOUTH ONCE DAILY 30 capsule 3  . esomeprazole (NEXIUM) 40 MG packet Take 40 mg by mouth daily before breakfast. 30 each 11  . ferrous sulfate 325 (65 FE) MG tablet Take 1 tablet (325 mg total) by mouth daily with breakfast. 30 tablet 6  . gabapentin (NEURONTIN) 600 MG tablet TAKE 2 TABLETS BY MOUTH 3 TIMES DAILY 180 tablet 2  . hydrOXYzine (ATARAX/VISTARIL) 25 MG tablet Take 1 tablet (25 mg total) by mouth at bedtime. 30 tablet 1  . ibuprofen (ADVIL,MOTRIN) 200 MG tablet Take 400 mg by mouth every 6 (six) hours as needed for fever, headache, mild pain, moderate pain or cramping.    . linaclotide (LINZESS) 145 MCG CAPS capsule Take 1 capsule (145 mcg total) by mouth daily before breakfast. Take on an empty stomach 30 capsule 0  . Magnesium Oxide 400 (240 Mg) MG TABS Take 1 tablet (400 mg total) by mouth 2 (two) times daily. 60 tablet 6  . QUEtiapine (SEROQUEL) 400 MG tablet Take 400 mg by mouth at bedtime.    . nicotine (NICODERM CQ - DOSED IN MG/24 HOURS) 21 mg/24hr patch Place 1 patch (21 mg total) onto the skin daily. (Patient not taking: Reported on 08/17/2018) 28 patch 1   No facility-administered medications prior to visit.     Allergies  Allergen Reactions  . 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 Lipitor [Atorvastatin] Other (See Comments)    "really bad leg pain"    ROS     Objective:    Physical Exam  Constitutional: She appears well-developed.  HENT:  Head: Normocephalic.  Eyes: Pupils are equal, round, and reactive to light. EOM are normal.  Neck: Normal range of motion. Neck supple.  Cardiovascular: Normal rate and regular rhythm.  Abdominal: Soft. Bowel sounds are normal.    Ht  (1.676 m)   Wt 150 lb 9.6 oz (68.3 kg)   LMP 02/22/2006   BMI 24.31 kg/m  Wt Readings from Last 3 Encounters:  08/30/18 150 lb 9.6 oz (68.3 kg)  07/25/18 150 lb 12.8 oz (68.4 kg)  07/11/18 152 lb (68.9 kg)    Health Maintenance Due   Topic Date Due  . MAMMOGRAM  06/23/2014  There are no preventive care reminders to display for this patient.   Lab Results  Component Value Date   TSH 0.690 04/11/2018   Lab Results  Component Value Date   WBC 5.9 06/02/2018   HGB 10.3 (L) 06/02/2018   HCT 33.6 (L) 06/02/2018   MCV 103.7 (H) 06/02/2018   PLT 226 06/02/2018   Lab Results  Component Value Date   NA 140 06/02/2018   K 3.1 (L) 06/02/2018   CO2 17 (L) 06/02/2018   GLUCOSE 94 06/02/2018   BUN 6 06/02/2018   CREATININE 0.59 06/02/2018   BILITOT 0.5 06/02/2018   ALKPHOS 171 (H) 06/02/2018   AST 13 (L) 06/02/2018   ALT 9 06/02/2018   PROT 5.3 (L) 06/02/2018   ALBUMIN 2.3 (L) 06/02/2018   CALCIUM 7.7 (L) 06/02/2018   ANIONGAP 12 06/02/2018   Lab Results  Component Value Date   CHOL 265 (H) 03/28/2018   Lab Results  Component Value Date   HDL 68 03/28/2018   Lab Results  Component Value Date   LDLCALC Comment 03/28/2018   Lab Results  Component Value Date   TRIG 965 (HH) 03/28/2018   Lab Results  Component Value Date   CHOLHDL 3.9 03/28/2018   Lab Results  Component Value Date   HGBA1C 4.8 04/13/2017       Assessment & Plan:   Problem List Items Addressed This Visit    None       No orders of the defined types were placed in this encounter.    Grayce Sessions, NP

## 2018-09-04 ENCOUNTER — Other Ambulatory Visit: Payer: Self-pay | Admitting: Nurse Practitioner

## 2018-09-05 ENCOUNTER — Inpatient Hospital Stay: Admission: RE | Admit: 2018-09-05 | Payer: Medicaid Other | Source: Ambulatory Visit

## 2018-09-05 ENCOUNTER — Ambulatory Visit: Payer: Medicaid Other | Admitting: Physical Therapy

## 2018-09-12 ENCOUNTER — Ambulatory Visit: Payer: Medicaid Other

## 2018-09-12 ENCOUNTER — Ambulatory Visit: Payer: Medicaid Other | Admitting: Physical Therapy

## 2018-09-27 ENCOUNTER — Telehealth: Payer: Self-pay | Admitting: Physical Therapy

## 2018-09-27 NOTE — Telephone Encounter (Signed)
Catherine Butler  was contacted today regarding the temporary reduction of OP Rehab Services due to concerns for community transmission of Covid-19.    Therapist advised the patient to continue to perform their HEP and assured they had no unanswered questions at this time.  The patient was offered and declined the continuation in their POC by using methods such as an e-visit, virtual check in, or telehealth visit.    Outpatient Rehabilitation Services will follow up with this client when we are able to safely resume care at the [facility] in person.   Patient is aware we can be reached by telephone during limited business hours in the meantime.

## 2018-09-28 ENCOUNTER — Encounter: Payer: Self-pay | Admitting: Neurology

## 2018-09-28 ENCOUNTER — Telehealth: Payer: Self-pay | Admitting: Neurology

## 2018-09-28 NOTE — Telephone Encounter (Signed)
Attempted to reach patient to update EMR before video visit. Phone continuously rang, no answer or VM.

## 2018-09-28 NOTE — Telephone Encounter (Signed)
Since this patient is a new patient and Dr. Athar would prefer to see this type of patient in office, could you call and see if she would like to be scheduled with Dr. Penumalli? °

## 2018-09-28 NOTE — Telephone Encounter (Signed)
Spoke with patient and updated EMR. She had no  questions,  verbalized understanding, appreciation.

## 2018-10-02 ENCOUNTER — Other Ambulatory Visit: Payer: Self-pay

## 2018-10-02 ENCOUNTER — Ambulatory Visit: Payer: Medicaid Other | Admitting: Diagnostic Neuroimaging

## 2018-10-04 ENCOUNTER — Ambulatory Visit: Payer: Medicaid Other | Admitting: Diagnostic Neuroimaging

## 2018-10-23 IMAGING — MR MR CERVICAL SPINE WO/W CM
4 of 8 series · 17 of 48 positions shown · IV contrast (multihance)
Comparison: Prior noncontrast MRIs of the thoracic and lumbar spine
performed earlier the same day.

CLINICAL DATA: Initial evaluation for generalized muscle weakness.

EXAM:
MRI CERVICAL AND LUMBAR SPINE WITHOUT AND WITH CONTRAST
TECHNIQUE: Multiplanar and multiecho pulse sequences of the cervical spine, to
include the craniocervical junction and cervicothoracic junction,
and lumbar spine, were obtained without and with intravenous
contrast.
CONTRAST:  15mL MULTIHANCE GADOBENATE DIMEGLUMINE 529 MG/ML IV SOLN

[Series 2: T1 · sagittal · 3.0mm · 0.41mm/px · 3 of 14 slices shown (1 of 2)]
[im 1/14]
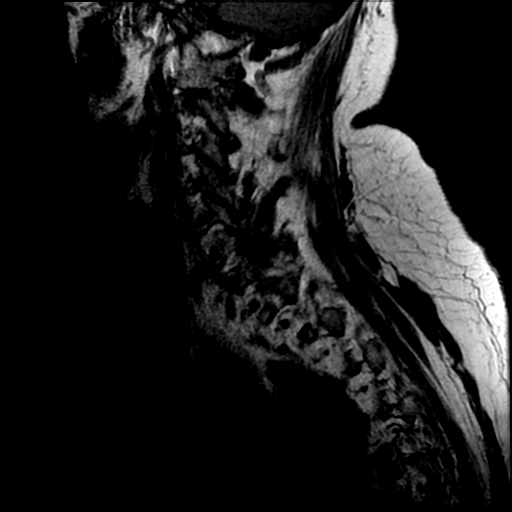
[im 9/14]
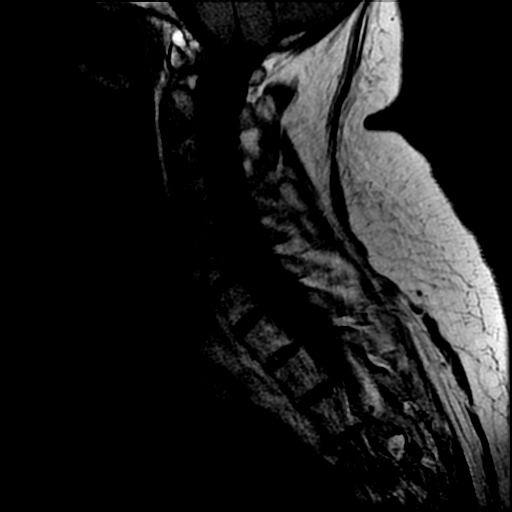
[im 14/14]
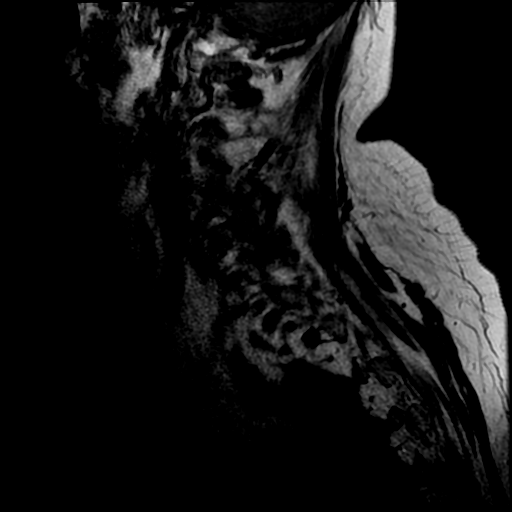

[Series 5: T1 · axial · non-contrast · 3.1mm · 0.35mm/px · z∈[-58,+10]mm · 3 of 29 slices shown (2 of 2)]
[im 5/29]
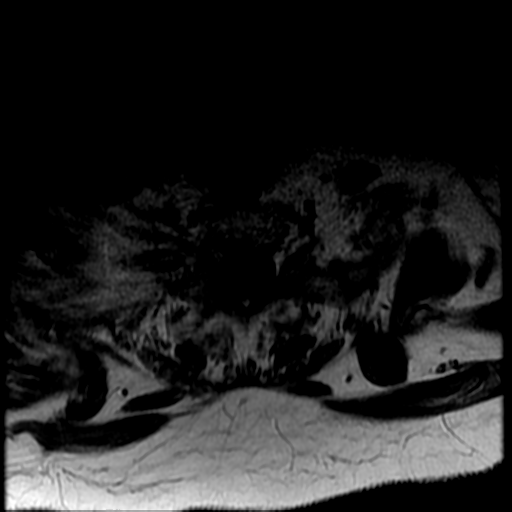
[im 17/29]
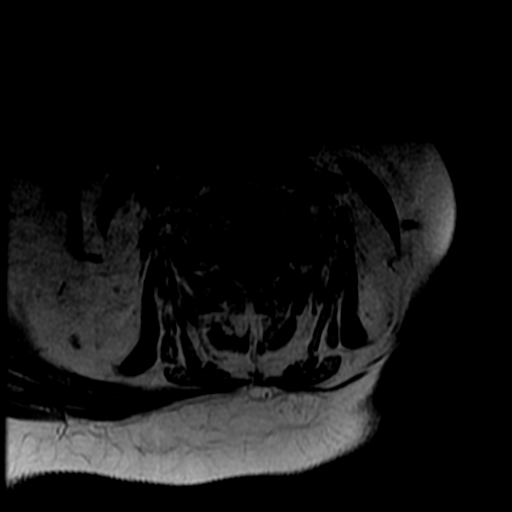
[im 25/29]
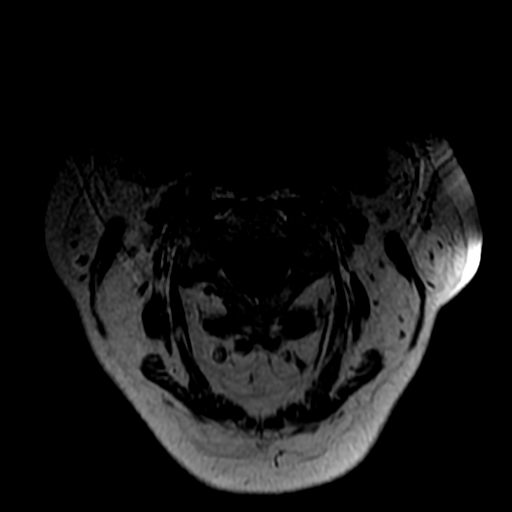

[Series 6: T2 · axial · 3.1mm · 0.35mm/px · z∈[-71,+23]mm · 8 of 29 slices shown]
[im 1/29]
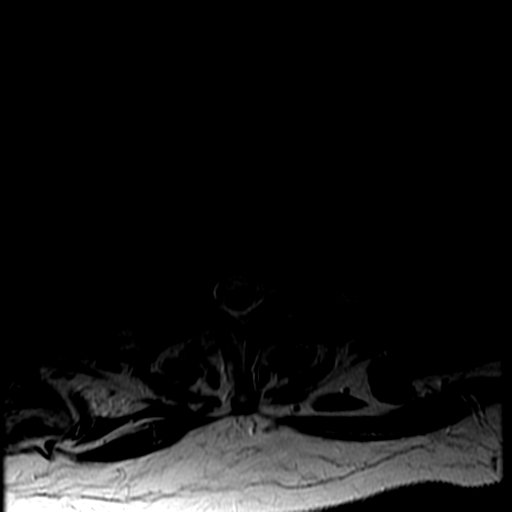
[im 5/29]
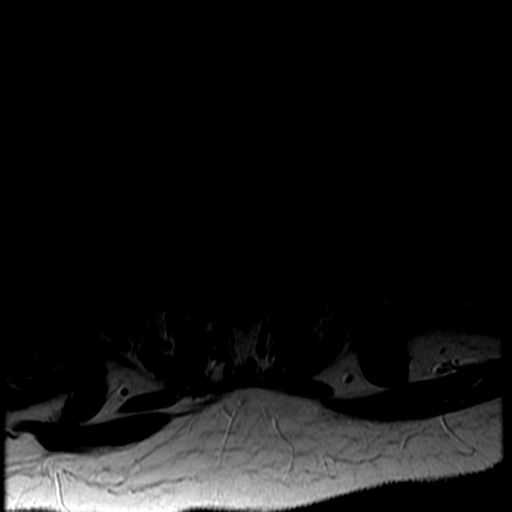
[im 9/29]
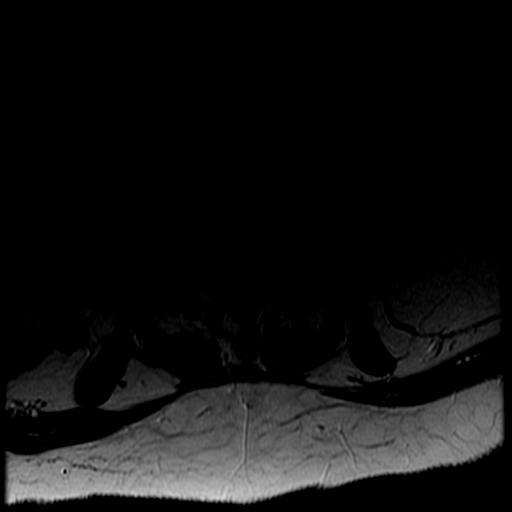
[im 13/29]
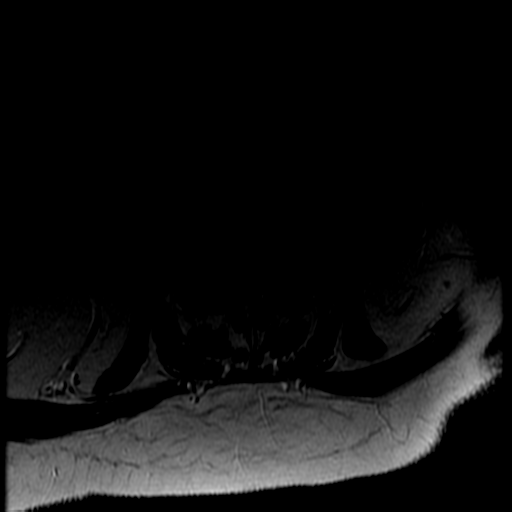
[im 17/29]
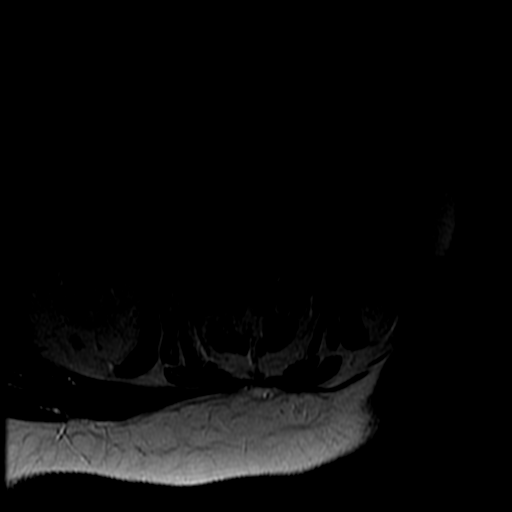
[im 21/29]
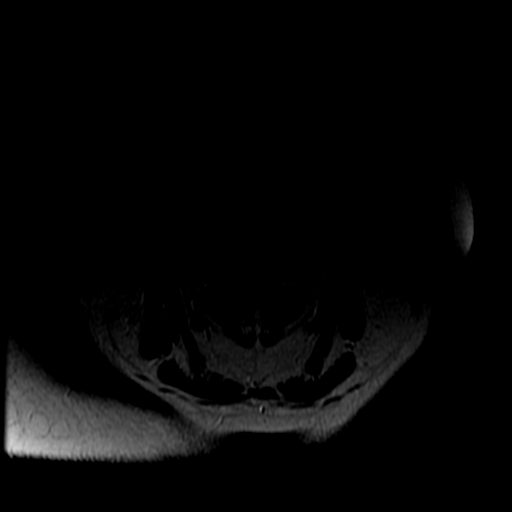
[im 25/29]
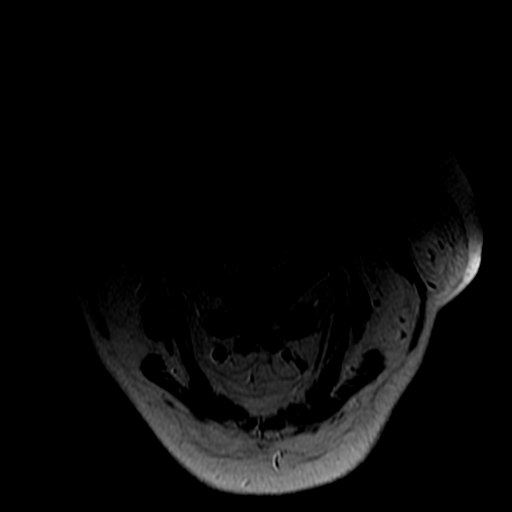
[im 29/29]
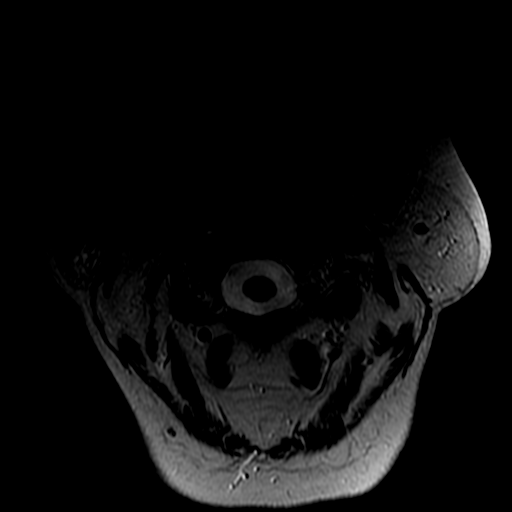

[Series 7: T2 post-contrast · sagittal · 3.0mm · 0.41mm/px · 3 of 14 slices shown]
[im 1/14]
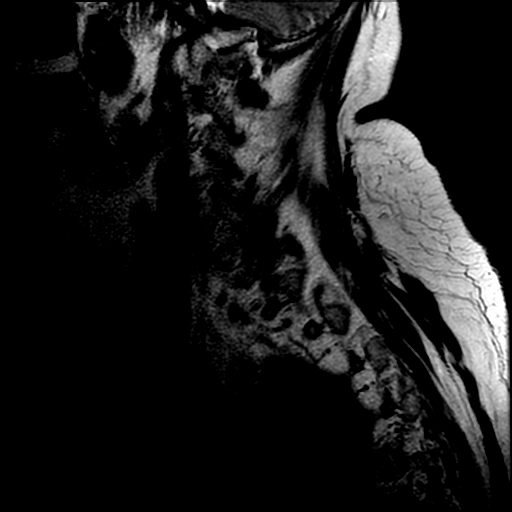
[im 9/14]
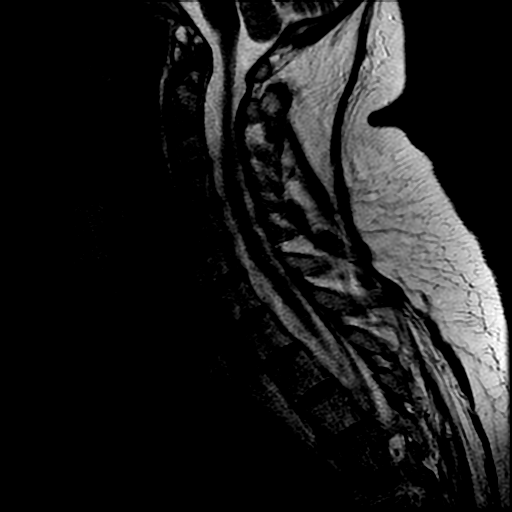
[im 14/14]
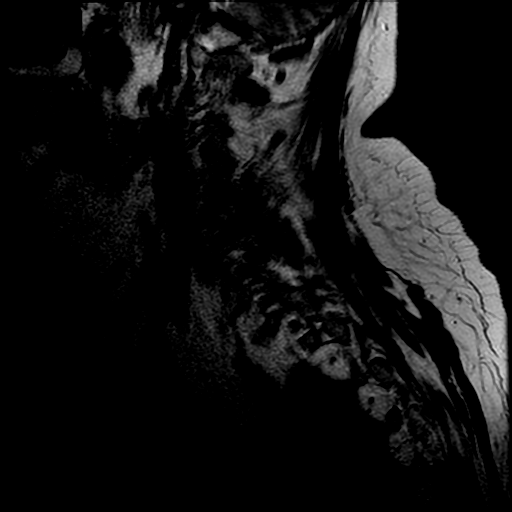

[17 of 48 positions shown; findings below may reference images not displayed]

FINDINGS: MRI CERVICAL SPINE FINDINGS

Alignment: Study degraded by motion artifact.

Vertebral bodies normally aligned with preservation of the normal
cervical lordosis. No listhesis or subluxation.

Vertebrae: Height loss at the superior endplate of T4 again noted.
Vertebral body heights otherwise maintained. No evidence for acute
cervical spine fracture. Bone marrow signal intensity within normal
limits. No discrete or worrisome osseous lesions. No abnormal
enhancement.

Cord: Signal intensity within the cervical spinal cord is normal.
Cord caliber within normal limits. No abnormal enhancement.

Posterior Fossa, vertebral arteries, paraspinal tissues: Paraspinal
soft tissues demonstrate no acute abnormality. No abnormal
prevertebral edema. Normal vascular flow voids noted within the
vertebral arteries bilaterally.

Disc levels:

C2-C3: Unremarkable.

C3-C4: Mild uncovertebral and facet hypertrophy. No canal stenosis.
Mild bilateral C4 foraminal narrowing.

C4-C5: Mild disc bulge. Right-sided uncovertebral hypertrophy.
Resultant moderate right C5 foraminal stenosis. No canal narrowing.

C5-C6: Mild disc bulge with uncovertebral hypertrophy. No canal
stenosis. Mild right C6 foraminal narrowing.

C6-C7:  Unremarkable.

C7-T1:  Unremarkable.

No made of a tiny right foraminal disc protrusion at T2-3 without
stenosis. Remainder the visualized upper thoracic spine grossly
unremarkable.

MRI LUMBAR SPINE FINDINGS

Segmentation: Limited post-contrast imaging of the lumbar spine was
performed. Normal segmentation. Lowest well-formed disc labeled the
L5-S1 level.

Alignment: Normal alignment with preservation of the normal lumbar
lordosis.

Vertebrae: Vertebral body heights maintained. No evidence for acute
or chronic fracture. Bone marrow signal intensity within normal
limits. No discrete osseous lesions. No abnormal enhancement.

Conus medullaris: Extends to the L1 level and appears normal. No
abnormal enhancement.

Paraspinal and other soft tissues: Paraspinous soft tissues within
normal limits. No abnormal enhancement. Visualized visceral
structures within normal limits.

Disc levels:

Mild multilevel degenerative changes again noted, described on prior
MRI from earlier same day. No significant stenosis or evidence for
neural impingement.
IMPRESSION: MRI CERVICAL SPINE IMPRESSION:

1. No acute abnormality within the cervical spine. No findings to
explain patient's symptoms identified.
2. Mild degenerative spondylolysis at C3-4 through C5-6 without
significant canal stenosis. Mild right greater than left foraminal
narrowing at these levels as above.
MRI LUMBAR SPINE IMPRESSION:

No abnormal enhancement or other acute abnormality within the lumbar
spine.

## 2018-10-23 IMAGING — MR MR THORACIC SPINE W/O CM
4 of 7 series · 19 of 48 positions shown · non-contrast
Comparison: CT chest 06/18/2015.

CLINICAL DATA: Urinary retention and bladder pain. Numbness from
the waist down. The patient suffered 2 falls last night. Initial
encounter.

EXAM:
MRI THORACIC SPINE WITHOUT CONTRAST
TECHNIQUE: Multiplanar, multisequence MR imaging of the thoracic spine was
performed. No intravenous contrast was administered.

[Series 5: T1 · sagittal · 3.0mm · 0.66mm/px · 4 of 15 slices shown (1 of 2)]
[im 1/15]
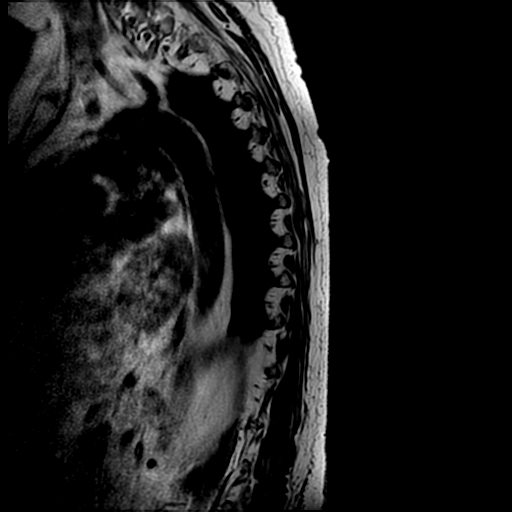
[im 4/15]
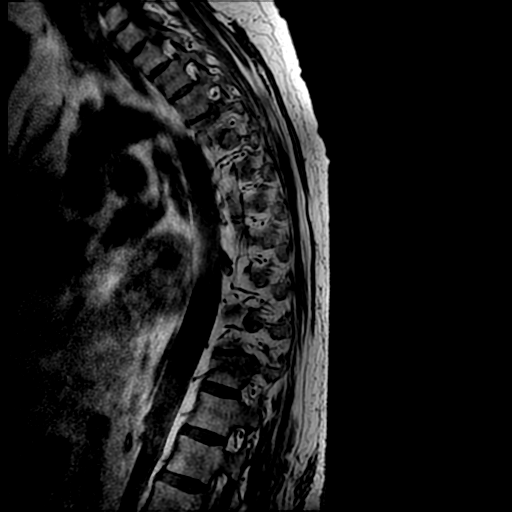
[im 8/15]
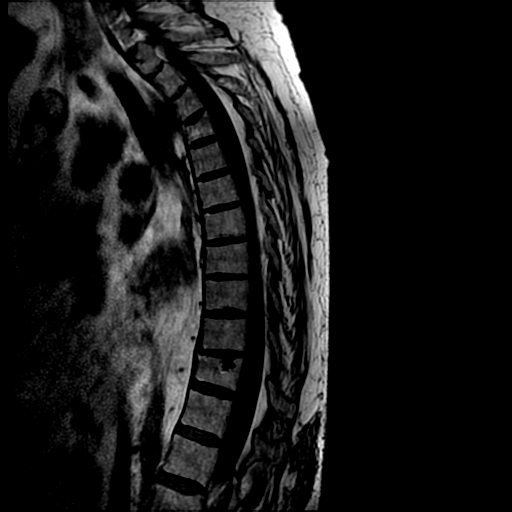
[im 15/15]
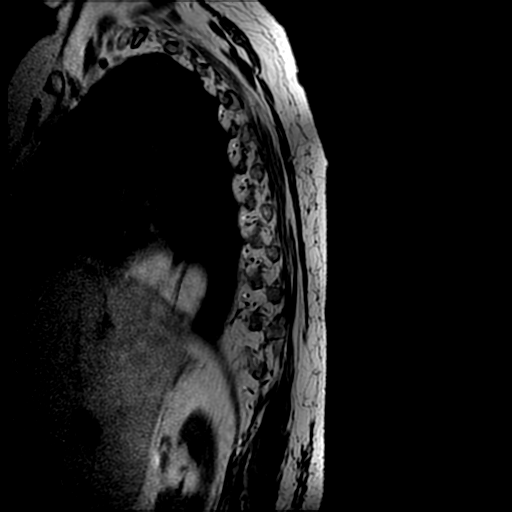

[Series 7: T2 post-contrast · sagittal · 3.0mm · 0.66mm/px · 4 of 15 slices shown]
[im 1/15]
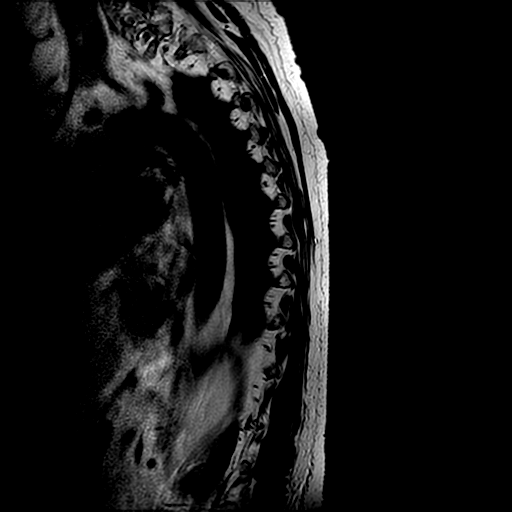
[im 5/15]
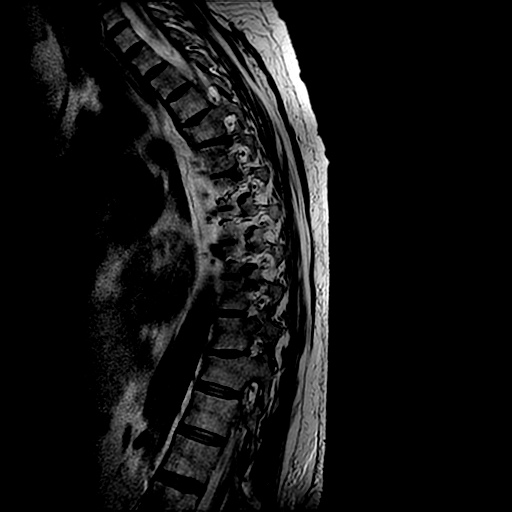
[im 10/15]
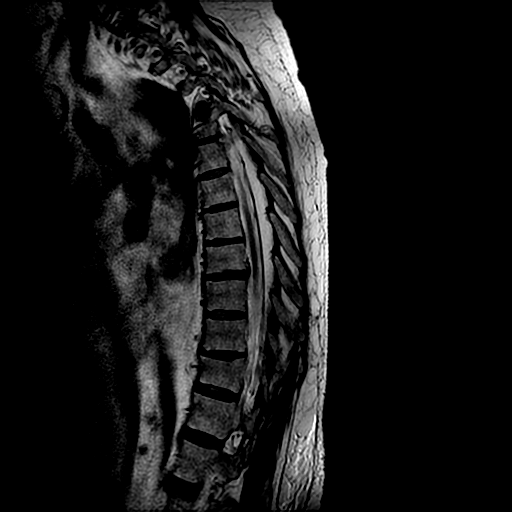
[im 15/15]
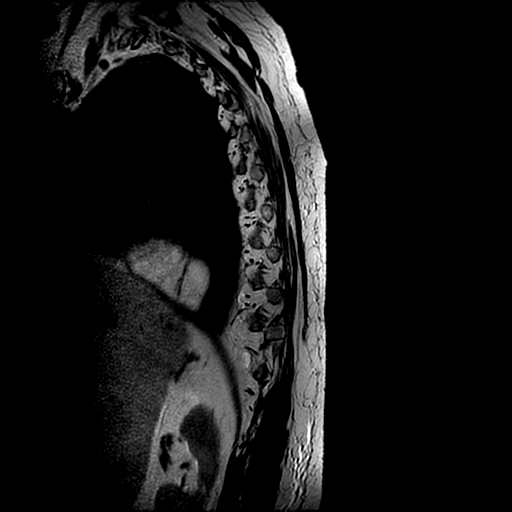

[Series 8: T2 · axial · 4.0mm · 0.39mm/px · z∈[-257,+55]mm · 8 of 36 slices shown]
[im 1/36]
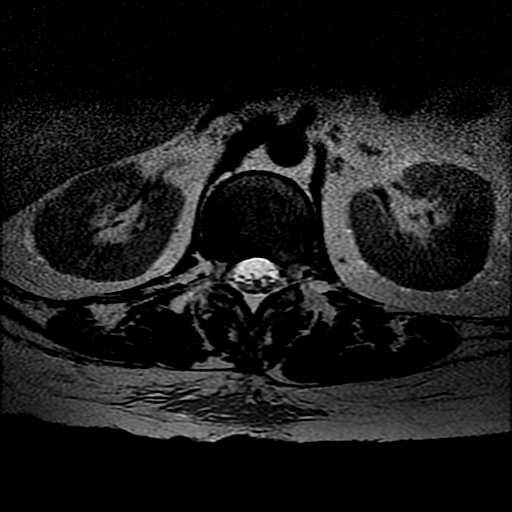
[im 4/36]
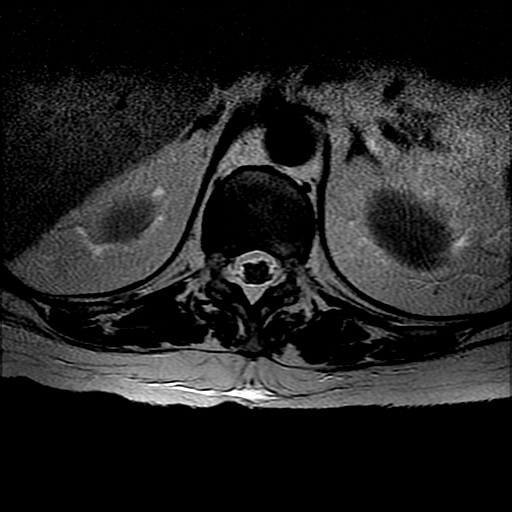
[im 12/36]
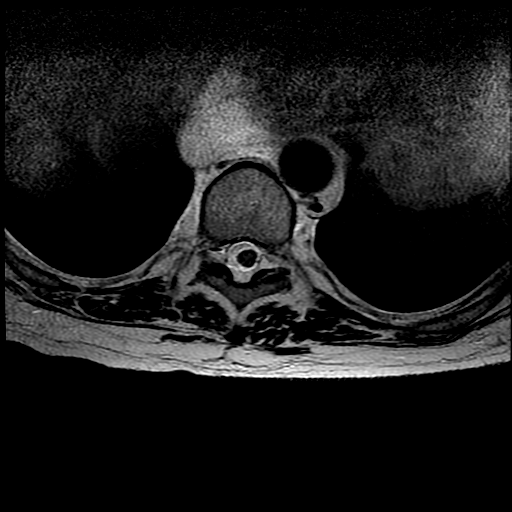
[im 16/36]
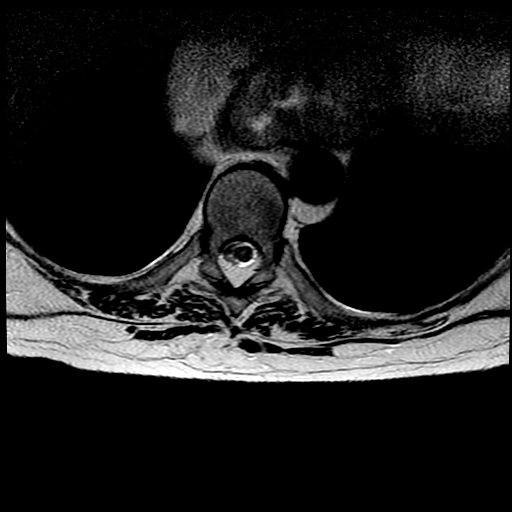
[im 20/36]
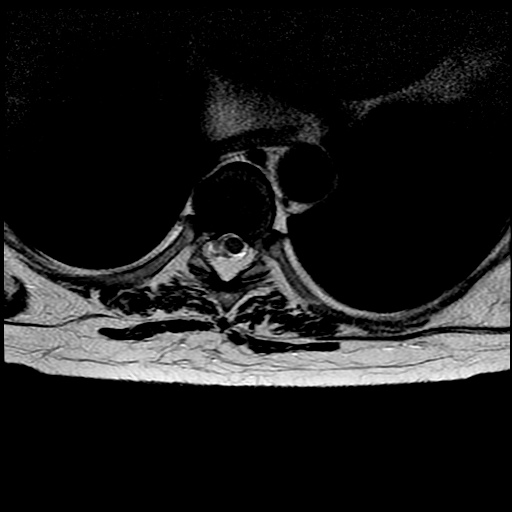
[im 24/36]
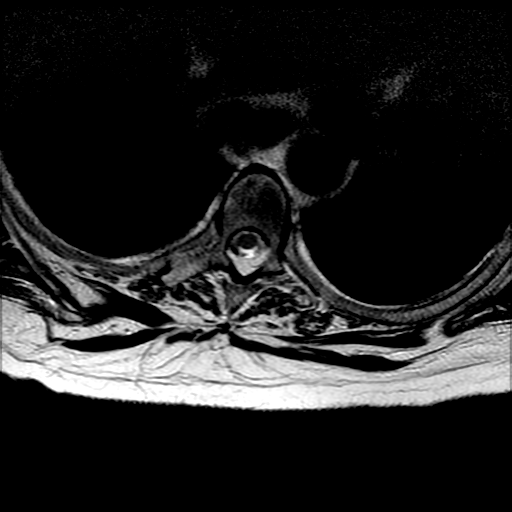
[im 32/36]
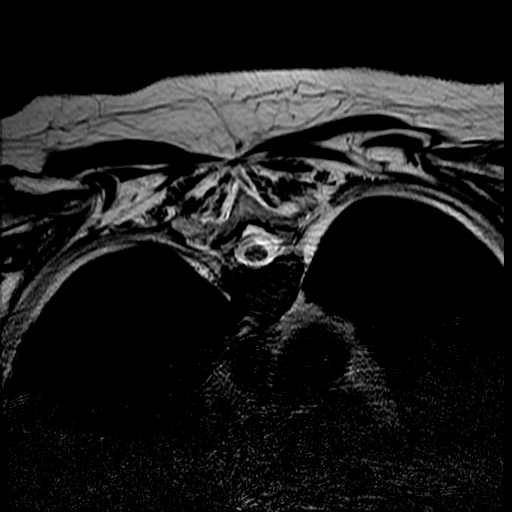
[im 36/36]
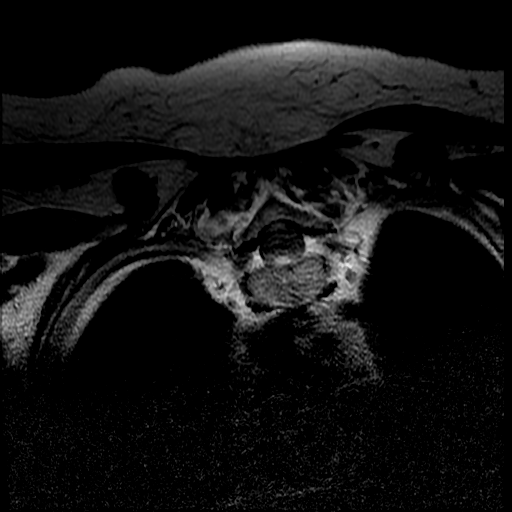

[Series 10: T1 · axial · non-contrast · 4.0mm · 0.39mm/px · z∈[-235,+35]mm · 3 of 36 slices shown (2 of 2)]
[im 4/36]
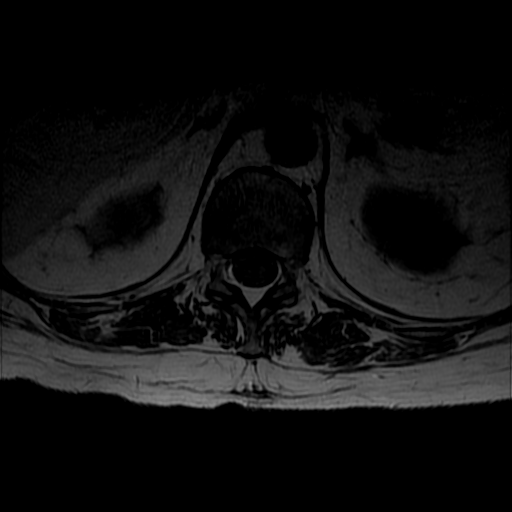
[im 20/36]
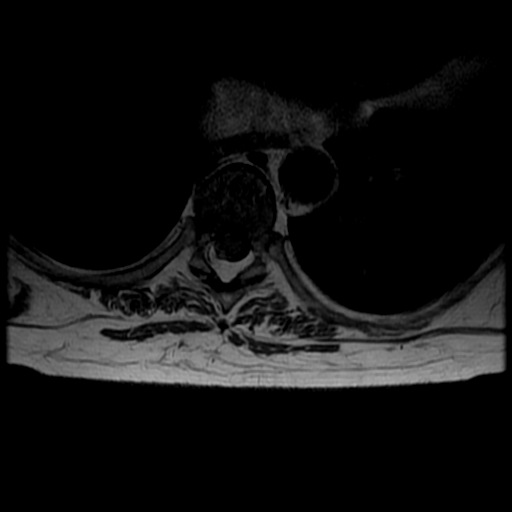
[im 32/36]
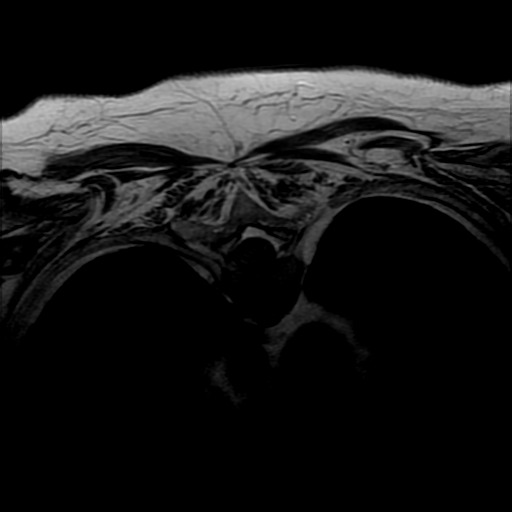

[19 of 48 positions shown; findings below may reference images not displayed]

FINDINGS: Alignment: There is mild exaggeration of the normal thoracic
kyphosis.

Vertebrae: Remote anterior, superior endplate compression fracture
of T4 with vertebral body height loss of approximately 25% is
unchanged. Prominent Schmorl's node in the superior endplate of T11
is also unchanged. There is no acute fracture or worrisome marrow
lesion.

Cord:  Normal signal throughout.  Epidural lipomatosis noted.

Paraspinal and other soft tissues: Hiatal hernia is noted.

Disc levels:

T4-5: Minimal disc bulge. No bony retropulsion off the superior
endplate of T4. The central canal and foramina are widely patent.

T7-8: Very shallow left paracentral protrusion. The central canal
and foramina are widely patent.

T8-9: Very shallow right paracentral protrusion with some cephalad
extension. The disc indents the ventral thecal sac but the central
canal and foramina are widely patent.

Intervertebral disc spaces are otherwise unremarkable.
IMPRESSION: No acute abnormality or finding to explain the patient's symptoms.

Mild degenerative disc disease most notable at T7-8 and T8-9 where
there are small disc protrusions. The central canal and foramina are
widely patent at all levels.

Epidural lipomatosis.

Very mild, remote superior endplate compression fracture of T4
without bony retropulsion.

## 2018-10-24 ENCOUNTER — Telehealth: Payer: Self-pay | Admitting: Physical Therapy

## 2018-10-24 NOTE — Telephone Encounter (Signed)
Contacted [facility] to resume Physical Therapy Services. She declined PT at this time and declined Tele-health. She was advised to obtain a new referral from her physician if she would like to continue PT in the future. She verbalized understanding. Will forward chart to primary PT for discharge.

## 2018-10-25 IMAGING — RF DG FLUORO GUIDE LUMBAR PUNCTURE
2 series · 2 of 2 positions shown · non-contrast
Comparison: none

CLINICAL DATA: Inpatient. Unexplained paresthesias and lower
extremity weakness.

[Series 1: run · 1 of 1 slices shown (1 of 2)]
[im 1/1]
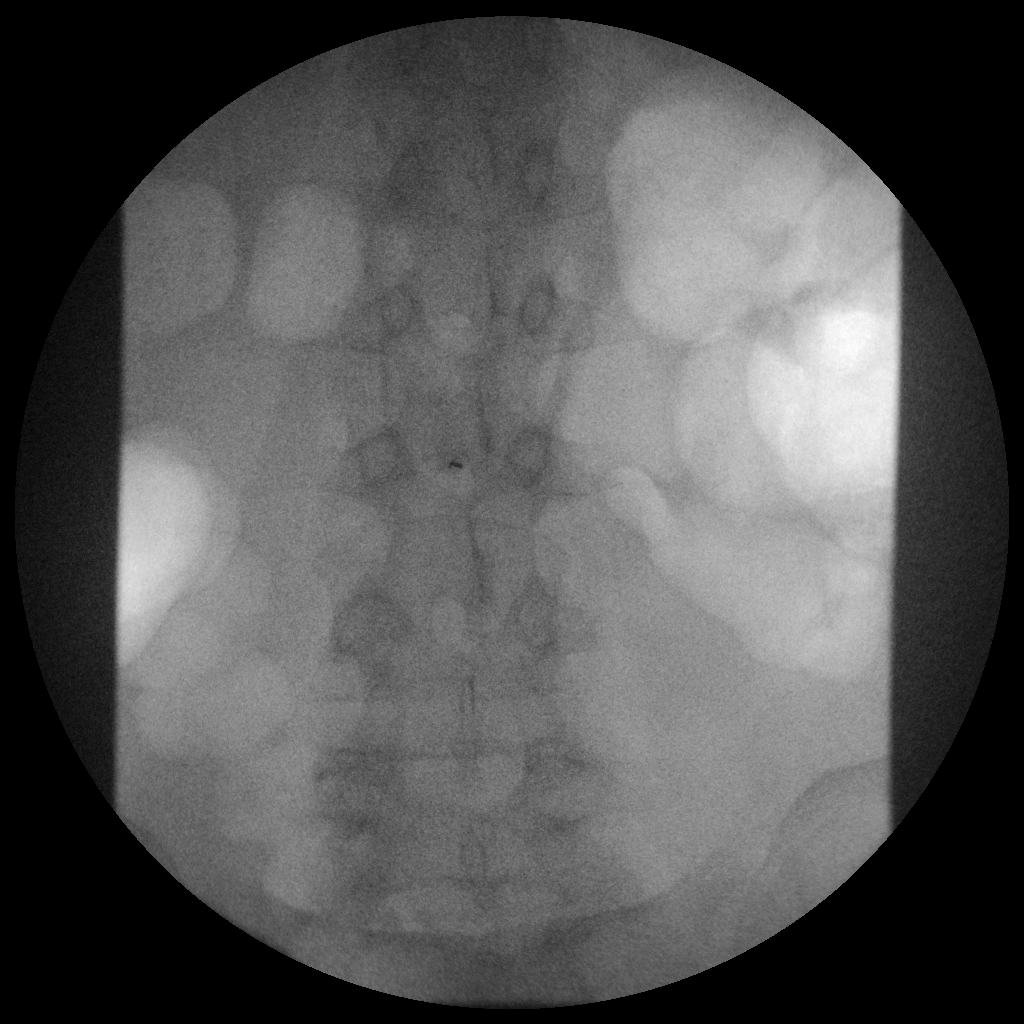

[Series 2: run · 1 of 1 slices shown (2 of 2)]
[im 1/1]
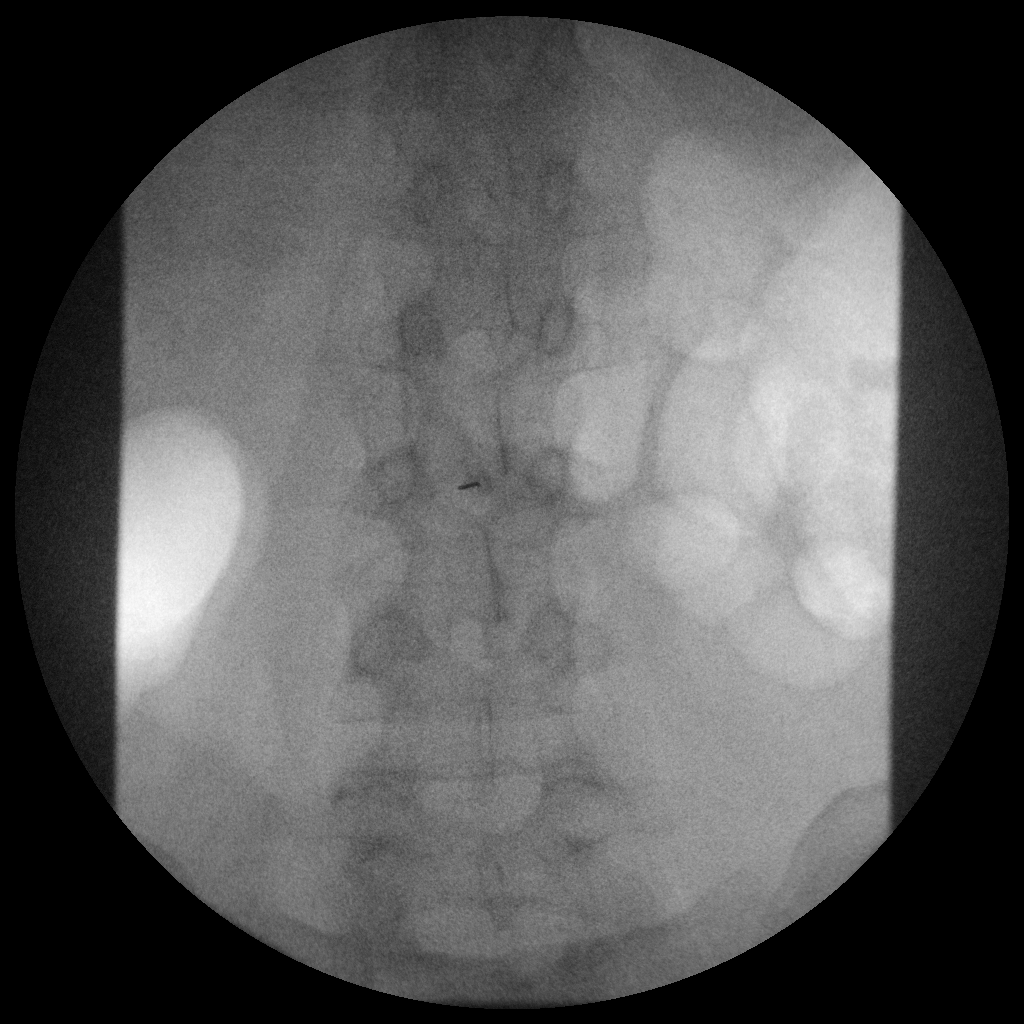

[2 of 2 positions shown; findings below may reference images not displayed]

EXAM:
DIAGNOSTIC LUMBAR PUNCTURE UNDER FLUOROSCOPIC GUIDANCE

FLUOROSCOPY TIME:  Fluoroscopy Time:  0 minutes 20 seconds

Radiation Exposure Index (if provided by the fluoroscopic device):
8.8 mGy

Number of Acquired Spot Images: 0

PROCEDURE:
Informed consent was obtained from the patient prior to the
procedure, including potential complications of headache, allergy,
and pain. With the patient prone, the lower back was prepped with
Betadine. 1% Lidocaine was used for local anesthesia. Lumbar
puncture was performed at the left L2-3 level via interlaminar
approach using a 20 gauge needle with return of clear CSF with an
opening pressure of 11 cm water. 8.5 ml of CSF were obtained for
laboratory studies. The patient tolerated the procedure well and
there were no apparent complications.
IMPRESSION: Technically successful diagnostic lumbar puncture under fluoroscopic
guidance.

## 2018-11-07 ENCOUNTER — Ambulatory Visit: Payer: Medicaid Other | Admitting: Neurology

## 2018-11-16 ENCOUNTER — Other Ambulatory Visit: Payer: Self-pay | Admitting: Gastroenterology

## 2018-12-08 ENCOUNTER — Other Ambulatory Visit (INDEPENDENT_AMBULATORY_CARE_PROVIDER_SITE_OTHER): Payer: Self-pay | Admitting: Primary Care

## 2018-12-08 DIAGNOSIS — Z760 Encounter for issue of repeat prescription: Secondary | ICD-10-CM

## 2018-12-08 DIAGNOSIS — R202 Paresthesia of skin: Secondary | ICD-10-CM

## 2019-01-03 ENCOUNTER — Other Ambulatory Visit: Payer: Self-pay | Admitting: Nurse Practitioner

## 2019-01-03 ENCOUNTER — Other Ambulatory Visit: Payer: Self-pay | Admitting: Gastroenterology

## 2019-01-22 ENCOUNTER — Telehealth (INDEPENDENT_AMBULATORY_CARE_PROVIDER_SITE_OTHER): Payer: Self-pay

## 2019-01-22 NOTE — Telephone Encounter (Signed)
Chris from [facility] called to get clarification in regards to gabapentin (NEURONTIN) 600 MG tablet   stating that Dr. Malachi Paradise from [facility] in [place] has been prescribing Gabapentin 400mg  and to take 4x daily but is needing clarification in which dosage to fill.   Please advice Gerald Stabs [phone]  Thank you Whitney Post

## 2019-01-23 ENCOUNTER — Other Ambulatory Visit (INDEPENDENT_AMBULATORY_CARE_PROVIDER_SITE_OTHER): Payer: Self-pay | Admitting: Primary Care

## 2019-01-23 NOTE — Progress Notes (Signed)
Gabapentin prescribes by behavioral health will defer all refills to them

## 2019-01-23 NOTE — Telephone Encounter (Signed)
FWD to PCP. Catherine Butler S Catherine Butler, CMA  

## 2019-01-23 NOTE — Telephone Encounter (Signed)
I will discontinue prescribing gabapentin and defer all refills to behavioral health

## 2019-01-24 NOTE — Telephone Encounter (Signed)
Spoke with pharmacist Sherren Mocha. He stated they hadnt filled the gabapentin 400 mg since may. They have been filling the gabapentin that PCP is prescribing. Please continue to prescribe. Nat Christen, CMA

## 2019-02-01 ENCOUNTER — Other Ambulatory Visit: Payer: Self-pay | Admitting: Nurse Practitioner

## 2019-02-01 ENCOUNTER — Other Ambulatory Visit: Payer: Self-pay | Admitting: Gastroenterology

## 2019-02-12 ENCOUNTER — Other Ambulatory Visit (INDEPENDENT_AMBULATORY_CARE_PROVIDER_SITE_OTHER): Payer: Self-pay | Admitting: Primary Care

## 2019-02-12 DIAGNOSIS — Z760 Encounter for issue of repeat prescription: Secondary | ICD-10-CM

## 2019-02-12 DIAGNOSIS — R202 Paresthesia of skin: Secondary | ICD-10-CM

## 2019-02-12 MED ORDER — GABAPENTIN 300 MG PO CAPS: 600.0000 mg | ORAL_CAPSULE | Freq: Three times a day (TID) | ORAL | 2 refills | Status: DC

## 2019-02-12 NOTE — Progress Notes (Unsigned)
ga

## 2019-02-12 NOTE — Telephone Encounter (Signed)
FWD to PCP. Riata Ikeda S Aaron Boeh, CMA  

## 2019-02-12 NOTE — Telephone Encounter (Signed)
Please refill. Patient is only receiving from our office. Nat Christen, CMA

## 2019-03-07 ENCOUNTER — Other Ambulatory Visit: Payer: Self-pay | Admitting: Nurse Practitioner

## 2019-03-12 ENCOUNTER — Other Ambulatory Visit (INDEPENDENT_AMBULATORY_CARE_PROVIDER_SITE_OTHER): Payer: Self-pay | Admitting: Primary Care

## 2019-03-12 DIAGNOSIS — Z760 Encounter for issue of repeat prescription: Secondary | ICD-10-CM

## 2019-03-12 DIAGNOSIS — R202 Paresthesia of skin: Secondary | ICD-10-CM

## 2019-03-20 ENCOUNTER — Encounter (INDEPENDENT_AMBULATORY_CARE_PROVIDER_SITE_OTHER): Payer: Self-pay | Admitting: Primary Care

## 2019-03-20 ENCOUNTER — Other Ambulatory Visit: Payer: Self-pay

## 2019-03-20 ENCOUNTER — Ambulatory Visit (INDEPENDENT_AMBULATORY_CARE_PROVIDER_SITE_OTHER): Payer: Medicaid Other | Admitting: Primary Care

## 2019-03-20 VITALS — BP 100/7 | HR 116 | Temp 98.3°F | Resp 18

## 2019-03-20 DIAGNOSIS — F329 Major depressive disorder, single episode, unspecified: Secondary | ICD-10-CM

## 2019-03-20 DIAGNOSIS — G610 Guillain-Barre syndrome: Secondary | ICD-10-CM

## 2019-03-20 DIAGNOSIS — I959 Hypotension, unspecified: Secondary | ICD-10-CM | POA: Diagnosis not present

## 2019-03-20 DIAGNOSIS — F17210 Nicotine dependence, cigarettes, uncomplicated: Secondary | ICD-10-CM

## 2019-03-20 DIAGNOSIS — F32A Depression, unspecified: Secondary | ICD-10-CM

## 2019-03-20 DIAGNOSIS — R202 Paresthesia of skin: Secondary | ICD-10-CM

## 2019-03-20 MED ORDER — DULOXETINE HCL 30 MG PO CPEP: 30.0000 mg | ORAL_CAPSULE | Freq: Every day | ORAL | 3 refills | Status: DC

## 2019-03-20 NOTE — Progress Notes (Signed)
Established Patient Office Visit  Subjective:  Patient ID: Catherine Butler, female    DOB: [birth year]  Age: 58 y.o. MRN: [id number]  CC:  Chief Complaint  Patient presents with  . Gait Problem    HPI Catherine Butler presents for weakness in lower extremities, paresthesis and unstable gait. Diagnoses with Guillain -Barre syndrome. Requesting refills .  Past Medical History:  Diagnosis Date  . Anxiety   . Arthritis   . Asthma   . Complication of anesthesia    nausea  . COPD (chronic obstructive pulmonary disease) ([place])   . Depression   . Guillain-Barre ([place]) 2018  . H/O alcohol abuse   . Hypertension    pt denies  . PONV (postoperative nausea and vomiting)   . Reflux   . Suicide attempt by multiple drug overdose 2014    Past Surgical History:  Procedure Laterality Date  . COLONOSCOPY WITH PROPOFOL N/A 07/21/2017   Procedure: COLONOSCOPY WITH PROPOFOL;  Surgeon: Milus Banister, MD;  Location: [facility];  Service: Endoscopy;  Laterality: N/A;  . LAPAROSCOPY    . laporscopy surgery for ovarian cyst  20 years ago  . TONSILLECTOMY    . TUBAL LIGATION      Family History  Problem Relation Age of Onset  . Diabetes Father   . Hypertension Father 4       heart attack  . Kidney disease Father   . Heart attack Father   . Heart failure Father   . Dementia Father   . Breast cancer Mother 30       lump removed, bile duct cancer  . COPD Mother   . Hypertension Brother   . Allergic rhinitis Paternal Grandfather     Social History   Socioeconomic History  . Marital status: Single    Spouse name: Not on file  . Number of children: 1  . Years of education: Not on file  . Highest education level: High school graduate  Occupational History  . Occupation: unemployed    Comment: prior Veterinary surgeon  Social Needs  . Financial resource strain: Not on file  . Food insecurity    Worry: Not on file    Inability: Not on file  . Transportation needs   Medical: Not on file    Non-medical: Not on file  Tobacco Use  . Smoking status: Current Every Day Smoker    Packs/day: 1.00    Years: 40.00    Pack years: 40.00    Types: Cigarettes  . Smokeless tobacco: Never Used  Substance and Sexual Activity  . Alcohol use: Yes    Frequency: Never    Comment: 1-2 glasses a month  . Drug use: No  . Sexual activity: Not Currently  Lifestyle  . Physical activity    Days per week: Not on file    Minutes per session: Not on file  . Stress: Not on file  Relationships  . Social Herbalist on phone: Not on file    Gets together: Not on file    Attends religious service: Not on file    Active member of club or organization: Not on file    Attends meetings of clubs or organizations: Not on file    Relationship status: Not on file  . Intimate partner violence    Fear of current or ex partner: Not on file    Emotionally abused: Not on file    Physically abused: Not on  file    Forced sexual activity: Not on file  Other Topics Concern  . Not on file  Social History Narrative   Lives with mom   Caffeine- tea during the day        Outpatient Medications Prior to Visit  Medication Sig Dispense Refill  . acetaminophen (TYLENOL) 500 MG tablet Take 1,000 mg by mouth 2 (two) times daily as needed for headache.    . albuterol (PROVENTIL HFA;VENTOLIN HFA) 108 (90 Base) MCG/ACT inhaler Inhale 2 puffs into the lungs every 6 (six) hours as needed for wheezing. 1 Inhaler 11  . albuterol (PROVENTIL) (2.5 MG/3ML) 0.083% nebulizer solution Take 3 mLs (2.5 mg total) by nebulization every 6 (six) hours as needed for wheezing or shortness of breath. 75 mL 4  . aspirin EC 81 MG tablet Take 1 tablet (81 mg total) by mouth daily. 90 tablet 3  . Cholecalciferol (VITAMIN D-3) 125 MCG (5000 UT) TABS 2 PO qd x 3 months, then 1 PO qd after that. 180 tablet 1  . cyanocobalamin (,VITAMIN B-12,) 1000 MCG/ML injection 1,000 mcg [place] qd x 1 week, then qod after that.  30 mL 6  . esomeprazole (NEXIUM) 40 MG capsule TAKE 1 CAPSULE BY MOUTH ONCE DAILY BEFORE BREAKFAST 30 capsule 1  . ferrous sulfate 325 (65 FE) MG tablet Take 1 tablet (325 mg total) by mouth daily with breakfast. 30 tablet 6  . gabapentin (NEURONTIN) 300 MG capsule Take 2 capsules (600 mg total) by mouth 3 (three) times daily. 180 capsule 2  . gabapentin (NEURONTIN) 600 MG tablet TAKE 2 TABLETS BY MOUTH 3 TIMES DAILY 180 tablet 2  . hydrOXYzine (ATARAX/VISTARIL) 25 MG tablet Take 1 tablet (25 mg total) by mouth at bedtime. 30 tablet 1  . ibuprofen (ADVIL,MOTRIN) 200 MG tablet Take 400 mg by mouth every 6 (six) hours as needed for fever, headache, mild pain, moderate pain or cramping.    Marland Kitchen. LINZESS 145 MCG CAPS capsule TAKE 1 CAPSULE BY MOUTH ONCE DAILY WITH BREAKFAST. TAKE ON AN EMPTY STOMACH 30 capsule 0  . Magnesium Oxide 400 (240 Mg) MG TABS Take 1 tablet (400 mg total) by mouth 2 (two) times daily. 60 tablet 6  . mirtazapine (REMERON) 15 MG tablet Take 15 mg by mouth at bedtime.    Marland Kitchen. QUEtiapine (SEROQUEL) 400 MG tablet Take 400 mg by mouth at bedtime.    . DULoxetine (CYMBALTA) 30 MG capsule Take 1 capsule (30 mg total) by mouth daily. 30 capsule 3  . nicotine (NICODERM CQ - DOSED IN MG/24 HOURS) 21 mg/24hr patch Place 1 patch (21 mg total) onto the skin daily. (Patient not taking: Reported on 08/17/2018) 28 patch 1   No facility-administered medications prior to visit.     Allergies  Allergen Reactions  . 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 Lipitor [Atorvastatin] Other (See Comments)    "really bad leg pain"    ROS Review of Systems  Constitutional: Positive for fatigue.  Musculoskeletal: Positive for arthralgias and gait problem.  Neurological: Positive for weakness and numbness.  All other systems reviewed and are negative.  Physical Exam  Constitutional: She is oriented to person, place, and time and well-developed, well-nourished, and in no  distress.  HENT:  Head: Normocephalic.  Eyes: Pupils are equal, round, and reactive to light. EOM are normal.  Neck: Normal range of motion.  Cardiovascular: Normal rate and regular rhythm.  Pulmonary/Chest: Effort normal and breath  sounds normal.  Abdominal: Soft. Bowel sounds are normal.  Musculoskeletal: Normal range of motion.  Neurological: She is alert and oriented to person, place, and time.  Skin: Skin is warm.  Psychiatric: Affect normal.     Objective:    BP (!) 100/7 (BP Location: Left Arm, Patient Position: Sitting, Cuff Size: Normal)   Pulse (!) 116   Temp 98.3 F (36.8 C) (Oral)   Resp 18   LMP 02/22/2006   SpO2 98%  Wt Readings from Last 3 Encounters:  08/30/18 150 lb 9.6 oz (68.3 kg)  07/25/18 150 lb 12.8 oz (68.4 kg)  07/11/18 152 lb (68.9 kg)     Health Maintenance Due  Topic Date Due  . MAMMOGRAM  06/23/2014    There are no preventive care reminders to display for this patient.  Lab Results  Component Value Date   TSH 0.690 04/11/2018   Lab Results  Component Value Date   WBC 5.9 06/02/2018   HGB 10.3 (L) 06/02/2018   HCT 33.6 (L) 06/02/2018   MCV 103.7 (H) 06/02/2018   PLT 226 06/02/2018   Lab Results  Component Value Date   NA 140 06/02/2018   K 3.1 (L) 06/02/2018   CO2 17 (L) 06/02/2018   GLUCOSE 94 06/02/2018   BUN 6 06/02/2018   CREATININE 0.59 06/02/2018   BILITOT 0.5 06/02/2018   ALKPHOS 171 (H) 06/02/2018   AST 13 (L) 06/02/2018   ALT 9 06/02/2018   PROT 5.3 (L) 06/02/2018   ALBUMIN 2.3 (L) 06/02/2018   CALCIUM 7.7 (L) 06/02/2018   ANIONGAP 12 06/02/2018   Lab Results  Component Value Date   CHOL 265 (H) 03/28/2018   Lab Results  Component Value Date   HDL 68 03/28/2018   Lab Results  Component Value Date   LDLCALC Comment 03/28/2018   Lab Results  Component Value Date   TRIG 965 (HH) 03/28/2018   Lab Results  Component Value Date   CHOLHDL 3.9 03/28/2018   Lab Results  Component Value Date   HGBA1C 4.8  04/13/2017      Assessment & Plan:  Iyanni was seen today for gait problem.  Diagnoses and all orders for this visit:  Guillain Barr syndrome [facility]) Guillain-Barre Syndrome Diagnosis was  October 2018  is a rare disorder in which your body's immune system attacks your nerves. Weakness and tingling in her lower  Extremities.  -     Ambulatory referral to Neurology -     CBC with Differential -     VITAMIN D 25 Hydroxy (Vit-D Deficiency, Fractures) -     Vitamin B12  Hypotension, unspecified hypotension type Increased fluid made aware of symptoms  can cause dizziness and fainting.  Increase fluids  -     CBC with Differential  Hypomagnesemia History of magnesium on supplement  -     Magnesium  Depression, unspecified depression type Depression is when you feel down, blue or sad for at least 2 weeks in a row. You may experience increaset increase in sleeping, eating or  loss of interest in doing things that once gave you pleasure. Feeling worthless, guilty, nervous and low self esteem, avoiding interaction with other people or increase agitation.  Paresthesia Paresthesia pricking, tingling, and numbness in any section of the body.It is an anomalous condition characterized by sensations of itching, burning, tingling, prickling, or numbness. Paresthesia may also be described as skin-crawling or pins-and-needles sensations. -     DULoxetine (CYMBALTA) 30 MG capsule; Take  1 capsule (30 mg total) by mouth daily.   Follow-up: Return in about 3 months (around 06/19/2019) for depression.    Grayce Sessions, NP

## 2019-03-21 LAB — CBC WITH DIFFERENTIAL/PLATELET
Basophils Absolute: 0.1 10*3/uL (ref 0.0–0.2)
Basos: 2 %
EOS (ABSOLUTE): 0.1 10*3/uL (ref 0.0–0.4)
Eos: 1 %
Hematocrit: 37.4 % (ref 34.0–46.6)
Hemoglobin: 12.8 g/dL (ref 11.1–15.9)
Immature Grans (Abs): 0 10*3/uL (ref 0.0–0.1)
Immature Granulocytes: 0 %
Lymphocytes Absolute: 2 10*3/uL (ref 0.7–3.1)
Lymphs: 41 %
MCH: 31.6 pg (ref 26.6–33.0)
MCHC: 34.2 g/dL (ref 31.5–35.7)
MCV: 92 fL (ref 79–97)
Monocytes Absolute: 0.7 10*3/uL (ref 0.1–0.9)
Monocytes: 14 %
Neutrophils Absolute: 2.1 10*3/uL (ref 1.4–7.0)
Neutrophils: 42 %
Platelets: 194 10*3/uL (ref 150–450)
RBC: 4.05 x10E6/uL (ref 3.77–5.28)
RDW: 13.5 % (ref 11.7–15.4)
WBC: 4.9 10*3/uL (ref 3.4–10.8)

## 2019-03-21 LAB — VITAMIN D 25 HYDROXY (VIT D DEFICIENCY, FRACTURES): Vit D, 25-Hydroxy: 41.1 ng/mL (ref 30.0–100.0)

## 2019-03-21 LAB — VITAMIN B12: Vitamin B-12: >2000 pg/mL — ABNORMAL HIGH (ref 232–1245)

## 2019-03-21 LAB — MAGNESIUM: Magnesium: 1.1 mg/dL — ABNORMAL LOW (ref 1.6–2.3)

## 2019-03-22 ENCOUNTER — Other Ambulatory Visit (INDEPENDENT_AMBULATORY_CARE_PROVIDER_SITE_OTHER): Payer: Self-pay | Admitting: Primary Care

## 2019-03-22 MED ORDER — MAGNESIUM OXIDE -MG SUPPLEMENT 400 (240 MG) MG PO TABS: 1.0000 | ORAL_TABLET | Freq: Two times a day (BID) | ORAL | 6 refills | Status: DC

## 2019-03-28 ENCOUNTER — Other Ambulatory Visit (INDEPENDENT_AMBULATORY_CARE_PROVIDER_SITE_OTHER): Payer: Self-pay | Admitting: Primary Care

## 2019-03-28 ENCOUNTER — Telehealth (INDEPENDENT_AMBULATORY_CARE_PROVIDER_SITE_OTHER): Payer: Self-pay

## 2019-03-28 MED ORDER — ONDANSETRON HCL 4 MG PO TABS: 4.0000 mg | ORAL_TABLET | Freq: Three times a day (TID) | ORAL | 0 refills | Status: DC | PRN

## 2019-03-28 NOTE — Telephone Encounter (Signed)
Zoloft was not sent in but will prescribe zofran for nausea

## 2019-03-28 NOTE — Telephone Encounter (Signed)
Patient called stating that she did not need zoloft that she needed zofran for nausea.  Please advice patient [phone]  Thank you Whitney Post

## 2019-03-28 NOTE — Telephone Encounter (Signed)
FWD to PCP. Mya Suell S Stefon Ramthun, CMA  

## 2019-04-16 ENCOUNTER — Other Ambulatory Visit (INDEPENDENT_AMBULATORY_CARE_PROVIDER_SITE_OTHER): Payer: Self-pay | Admitting: Primary Care

## 2019-04-16 ENCOUNTER — Other Ambulatory Visit: Payer: Self-pay | Admitting: Nurse Practitioner

## 2019-04-16 ENCOUNTER — Other Ambulatory Visit: Payer: Self-pay | Admitting: Gastroenterology

## 2019-05-07 ENCOUNTER — Encounter

## 2019-05-07 ENCOUNTER — Ambulatory Visit: Payer: Medicaid Other | Admitting: Diagnostic Neuroimaging

## 2019-05-07 ENCOUNTER — Telehealth: Payer: Self-pay | Admitting: *Deleted

## 2019-05-07 NOTE — Telephone Encounter (Signed)
Patient was no show for new patient appointment today. 

## 2019-05-08 ENCOUNTER — Encounter: Payer: Self-pay | Admitting: Diagnostic Neuroimaging

## 2019-05-15 ENCOUNTER — Ambulatory Visit (INDEPENDENT_AMBULATORY_CARE_PROVIDER_SITE_OTHER): Payer: Medicaid Other | Admitting: Primary Care

## 2019-05-21 ENCOUNTER — Ambulatory Visit (INDEPENDENT_AMBULATORY_CARE_PROVIDER_SITE_OTHER): Payer: Medicaid Other

## 2019-05-21 ENCOUNTER — Other Ambulatory Visit: Payer: Self-pay

## 2019-05-21 ENCOUNTER — Encounter (HOSPITAL_COMMUNITY): Payer: Self-pay

## 2019-05-21 ENCOUNTER — Ambulatory Visit (HOSPITAL_COMMUNITY)
Admission: EM | Admit: 2019-05-21 | Discharge: 2019-05-21 | Disposition: A | Discharge status: Home / Self Care | Payer: Medicaid Other | Attending: Family Medicine | Admitting: Family Medicine

## 2019-05-21 DIAGNOSIS — K5900 Constipation, unspecified: Secondary | ICD-10-CM

## 2019-05-21 LAB — POCT URINALYSIS DIP (DEVICE)
Bilirubin Urine: NEGATIVE
Glucose, UA: NEGATIVE mg/dL
Ketones, ur: NEGATIVE mg/dL
Leukocytes,Ua: NEGATIVE
Nitrite: NEGATIVE
Protein, ur: NEGATIVE mg/dL
Specific Gravity, Urine: 1.01 (ref 1.005–1.030)
Urobilinogen, UA: 0.2 mg/dL (ref 0.0–1.0)
pH: 6.5 (ref 5.0–8.0)

## 2019-05-21 MED ORDER — POLYETHYLENE GLYCOL 3350 17 GM/SCOOP PO POWD: 1.0000 | Freq: Once | ORAL | 0 refills | Status: AC

## 2019-05-21 NOTE — ED Provider Notes (Signed)
[facility]    CSN: [id number] Arrival date & time: 05/21/19  1054      History   Chief Complaint Chief Complaint  Patient presents with  . Urinary Tract Infection    HPI Catherine Butler is a 58 y.o. female.   Patient is a 58 year old female with past medical history of anxiety, arthritis, asthma, COPD, depression, Guillain-Barr, alcohol abuse, hypertension, reflux, suicide attempt.  She presents today with abdominal discomfort, lower back pain, bloating, urinary hesitancy and retention.  This is been intermittent over the past week and a half.  She thought she may have been constipated and took Dulcolax 1 time and drink a small amount of magnesium citrate without any relief.  Unsure of last bowel movement.  Mild nausea without vomiting.  ROS per HPI      Past Medical History:  Diagnosis Date  . Anxiety   . Arthritis   . Asthma   . Complication of anesthesia    nausea  . COPD (chronic obstructive pulmonary disease) (HCC)   . Depression   . Guillain-Barre (HCC) 2018  . H/O alcohol abuse   . Hypertension    pt denies  . PONV (postoperative nausea and vomiting)   . Reflux   . Suicide attempt by multiple drug overdose [facility]) 2014    Patient Active Problem List   Diagnosis Date Noted  . Fall 06/01/2018  . Acute metabolic encephalopathy 06/01/2018  . History of vertebral fracture 06/01/2018  . Closed fracture of spinous process of thoracic vertebra (HCC) 06/01/2018  . Thoracic compression fracture (HCC) 06/01/2018  . Lumbar compression fracture (HCC) 06/01/2018  . Closed rib fracture 06/01/2018  . Acute pancreatitis due to calculus of common bile duct 06/01/2018  . Rib pain on left side   . Cough 04/25/2018  . CAP (community acquired pneumonia) 04/10/2018  . Protein-calorie malnutrition, severe 07/21/2017  . Colonic mass 07/18/2017  . Dehydration 07/18/2017  . Hypotension 07/18/2017  . Hypomagnesemia 07/18/2017  . History of prolonged Q-T interval on  ECG 07/06/2017  . Tachycardia   . Generalized anxiety disorder   . Acute lower UTI   . Debility 06/23/2017  . Constipation   . Quadriplegia and quadriparesis (HCC)   . Guillain Barr syndrome (HCC)   . Nausea & vomiting 06/21/2017  . Hypokalemia 06/21/2017  . Volume depletion 06/21/2017  . Sinus tachycardia 06/21/2017  . Nausea and vomiting 06/21/2017  . Essential hypertension 04/14/2017  . Paresthesia   . 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 Abdominal pain 01/01/2017  . Sepsis (HCC) 01/01/2017  . Tobacco abuse 01/01/2017  . Abnormal LFTs 01/01/2017  . Depression   . COPD (chronic obstructive pulmonary disease) (HCC) 06/16/2015    Past Surgical History:  Procedure Laterality Date  . COLONOSCOPY WITH PROPOFOL N/A 07/21/2017   Procedure: COLONOSCOPY WITH PROPOFOL;  Surgeon: Rachael Fee, MD;  Location: [facility];  Service: Endoscopy;  Laterality: N/A;  . LAPAROSCOPY    . laporscopy surgery for ovarian cyst  20 years ago  . TONSILLECTOMY    . TUBAL LIGATION      OB History    Gravida  3   Para  1   Term  1   Preterm      AB  1   Living  1     SAB  1   TAB      Ectopic      Multiple      Live Births  Home Medications    Prior to Admission medications   Medication Sig Start Date End Date Taking? Authorizing Provider  acetaminophen (TYLENOL) 500 MG tablet Take 1,000 mg by mouth 2 (two) times daily as needed for headache.    [provider]  albuterol (PROVENTIL HFA;VENTOLIN HFA) 108 (90 Base) MCG/ACT inhaler Inhale 2 puffs into the lungs every 6 (six) hours as needed for wheezing. 08/30/18   Kerin Perna, NP  albuterol (PROVENTIL) (2.5 MG/3ML) 0.083% nebulizer solution Take 3 mLs (2.5 mg total) by nebulization every 6 (six) hours as needed for wheezing or shortness of breath. 08/30/18   Kerin Perna, NP  aspirin EC 81 MG tablet Take 1 tablet (81 mg total) by mouth daily.  03/28/18   Clent Demark, PA-C  Cholecalciferol (VITAMIN D-3) 125 MCG (5000 UT) TABS 2 PO qd x 3 months, then 1 PO qd after that. 08/10/18   Hilts, Legrand Como, MD  DULoxetine (CYMBALTA) 30 MG capsule Take 1 capsule (30 mg total) by mouth daily. 03/20/19   Kerin Perna, NP  esomeprazole (NEXIUM) 40 MG capsule TAKE 1 CAPSULE BY MOUTH ONCE DAILY BEFORE BREAKFAST 04/16/19   Milus Banister, MD  ferrous sulfate 325 (65 FE) MG tablet Take 1 tablet (325 mg total) by mouth daily with breakfast. 08/09/18   Hilts, Legrand Como, MD  gabapentin (NEURONTIN) 300 MG capsule Take 2 capsules (600 mg total) by mouth 3 (three) times daily. 02/12/19   Kerin Perna, NP  gabapentin (NEURONTIN) 600 MG tablet TAKE 2 TABLETS BY MOUTH 3 TIMES DAILY 03/12/19   Kerin Perna, NP  hydrOXYzine (ATARAX/VISTARIL) 25 MG tablet Take 1 tablet (25 mg total) by mouth at bedtime. 02/28/18   Clent Demark, PA-C  ibuprofen (ADVIL,MOTRIN) 200 MG tablet Take 400 mg by mouth every 6 (six) hours as needed for fever, headache, mild pain, moderate pain or cramping.    [provider]  LINZESS 145 MCG CAPS capsule TAKE 1 CAPSULE BY MOUTH ONCE DAILY BREAKFAST. TAKE ON AN EMPTY STOMACH 04/16/19   Willia Craze, NP  Magnesium Oxide 400 (240 Mg) MG TABS Take 1 tablet (400 mg total) by mouth 2 (two) times daily. 03/22/19   Kerin Perna, NP  mirtazapine (REMERON) 15 MG tablet Take 15 mg by mouth at bedtime.    [provider]  nicotine (NICODERM CQ - DOSED IN MG/24 HOURS) 21 mg/24hr patch Place 1 patch (21 mg total) onto the skin daily. Patient not taking: Reported on 08/17/2018 03/28/18   Clent Demark, PA-C  ondansetron (ZOFRAN) 4 MG tablet TAKE 1 TABLET BY MOUTH EVERY 8 HOURS AS NEEDED FOR NAUSEA AND VOMITING 04/16/19   Charlott Rakes, MD  QUEtiapine (SEROQUEL) 400 MG tablet Take 400 mg by mouth at bedtime.    [provider]    Family History Family History  Problem Relation Age of Onset   . Diabetes Father   . Hypertension Father 75       heart attack  . Kidney disease Father   . Heart attack Father   . Heart failure Father   . Dementia Father   . Breast cancer Mother 30       lump removed, bile duct cancer  . COPD Mother   . Hypertension Brother   . Allergic rhinitis Paternal Grandfather     Social History Social History   Tobacco Use  . Smoking status: Current Every Day Smoker    Packs/day: 1.00    Years:  40.00    Pack years: 40.00    Types: Cigarettes  . Smokeless tobacco: Never Used  Substance Use Topics  . Alcohol use: Yes    Frequency: Never    Comment: 1-2 glasses a month  . Drug use: No     Allergies   Influenza vaccines and Lipitor [atorvastatin]   Review of Systems Review of Systems   Physical Exam Triage Vital Signs ED Triage Vitals  Enc Vitals Group     BP 05/21/19 1134 108/72     Pulse Rate 05/21/19 1134 92     Resp 05/21/19 1134 16     Temp 05/21/19 1134 98 F (36.7 C)     Temp Source 05/21/19 1134 Oral     SpO2 05/21/19 1134 99 %     Weight --      Height --      Head Circumference --      Peak Flow --      Pain Score 05/21/19 1132 4     Pain Loc --      Pain Edu? --      Excl. in GC? --    No data found.  Updated Vital Signs BP 108/72 (BP Location: Right Arm)   Pulse 92   Temp 98 F (36.7 C) (Oral)   Resp 16   LMP 02/22/2006   SpO2 99%   Visual Acuity Right Eye Distance:   Left Eye Distance:   Bilateral Distance:    Right Eye Near:   Left Eye Near:    Bilateral Near:     Physical Exam Vitals signs and nursing note reviewed.  Constitutional:      General: She is not in acute distress.    Appearance: Normal appearance. She is not ill-appearing, toxic-appearing or diaphoretic.  HENT:     Head: Normocephalic and atraumatic.     Nose: Nose normal.  Eyes:     Conjunctiva/sclera: Conjunctivae normal.  Neck:     Musculoskeletal: Normal range of motion.  Pulmonary:     Effort: Pulmonary effort is  normal.  Abdominal:     General: There is distension.     Palpations: Abdomen is soft.     Tenderness: There is no right CVA tenderness, left CVA tenderness, guarding or rebound.     Comments: Generalized tenderness.   Musculoskeletal: Normal range of motion.  Skin:    General: Skin is warm and dry.  Neurological:     Mental Status: She is alert.  Psychiatric:        Mood and Affect: Mood normal.      UC Treatments / Results  Labs (all labs ordered are listed, but only abnormal results are displayed) Labs Reviewed  POCT URINALYSIS DIP (DEVICE) - Abnormal; Notable for the following components:      Result Value   Hgb urine dipstick SMALL (*)    All other components within normal limits  URINE CULTURE    EKG   Radiology Dg Abd 2 Views  Result Date: 05/21/2019 CLINICAL DATA:  Abdominal pain and bloating. EXAM: ABDOMEN - 2 VIEW COMPARISON:  07/05/2017 FINDINGS: There is a moderate to large amount of stool in the colon, most notably in the region of the splenic flexure. Scattered small and large bowel gas is present without dilated loops of bowel suggestive of obstruction. No intraperitoneal free air is identified. There is scarring in the left lung base. No acute osseous abnormality is seen. IMPRESSION: Moderate to large amount of colonic stool. No  evidence of bowel obstruction. Electronically Signed   By: Sebastian AcheAllen  Grady M.D.   On: 05/21/2019 12:43    Procedures Procedures (including critical care time)  Medications Ordered in UC Medications - No data to display  Initial Impression / Assessment and Plan / UC Course  I have reviewed the triage vital signs and the nursing notes.  Pertinent labs & imaging results that were available during my care of the patient were reviewed by me and considered in my medical decision making (see chart for details).     Constipation- pt x ray with large stool, very distended on exam No bowel obstruction This is likely the cause of her  symptoms.  Urine with small blood but without leuks, nitrites Unable to send for culture do to small amount of urine.  miralax to treat Push fluids  Return precautions and ER precautions given.   Final Clinical Impressions(s) / UC Diagnoses   Final diagnoses:  Constipation, unspecified constipation type     Discharge Instructions     I believe your symptoms are most likely from constipation.  The x-ray showed a large amount of stool in the colon.  This is probably causing your urinary symptoms. I am going to prescribe MiraLAX.  You can do 1 scoop twice a day until you get a good bowel movement.  When she had a good bowel movement and feeling better you can back off to once a day or once every other day to keep you regular. Make sure you are drinking lots and lots of water to stay hydrated Follow up as needed for continued or worsening symptoms     ED Prescriptions    Medication Sig Dispense Auth. Provider   polyethylene glycol powder (GLYCOLAX/MIRALAX) 17 GM/SCOOP powder Take 255 g by mouth once for 1 dose. 255 g Dahlia ByesBast, Brendalyn Vallely A, NP     PDMP not reviewed this encounter.   Janace ArisBast, Lindzy Rupert A, NP 05/22/19 [phone]

## 2019-05-21 NOTE — Discharge Instructions (Signed)
I believe your symptoms are most likely from constipation.  The x-ray showed a large amount of stool in the colon.  This is probably causing your urinary symptoms. I am going to prescribe MiraLAX.  You can do 1 scoop twice a day until you get a good bowel movement.  When she had a good bowel movement and feeling better you can back off to once a day or once every other day to keep you regular. Make sure you are drinking lots and lots of water to stay hydrated Follow up as needed for continued or worsening symptoms

## 2019-05-21 NOTE — ED Triage Notes (Signed)
Patient presents to Urgent Care with complaints of lower abdominal bloating and urinary hesitancy since about a week and a half intermittently. Patient reports she thinks she has a UTI.

## 2019-05-23 ENCOUNTER — Other Ambulatory Visit: Payer: Self-pay | Admitting: Nurse Practitioner

## 2019-05-29 ENCOUNTER — Other Ambulatory Visit: Payer: Self-pay

## 2019-05-29 ENCOUNTER — Ambulatory Visit (INDEPENDENT_AMBULATORY_CARE_PROVIDER_SITE_OTHER): Payer: Medicaid Other | Admitting: Primary Care

## 2019-05-29 ENCOUNTER — Encounter (INDEPENDENT_AMBULATORY_CARE_PROVIDER_SITE_OTHER): Payer: Self-pay | Admitting: Primary Care

## 2019-05-29 VITALS — BP 91/69 | HR 112 | Temp 97.2°F | Ht 66.0 in | Wt 139.2 lb

## 2019-05-29 DIAGNOSIS — R202 Paresthesia of skin: Secondary | ICD-10-CM | POA: Diagnosis not present

## 2019-05-29 DIAGNOSIS — I959 Hypotension, unspecified: Secondary | ICD-10-CM | POA: Diagnosis not present

## 2019-05-29 DIAGNOSIS — G610 Guillain-Barre syndrome: Secondary | ICD-10-CM | POA: Diagnosis not present

## 2019-05-29 DIAGNOSIS — Z760 Encounter for issue of repeat prescription: Secondary | ICD-10-CM

## 2019-05-29 DIAGNOSIS — Z720 Tobacco use: Secondary | ICD-10-CM

## 2019-05-29 DIAGNOSIS — F32A Depression, unspecified: Secondary | ICD-10-CM

## 2019-05-29 DIAGNOSIS — F329 Major depressive disorder, single episode, unspecified: Secondary | ICD-10-CM

## 2019-05-29 MED ORDER — ONDANSETRON 4 MG PO TBDP: 4.0000 mg | ORAL_TABLET | Freq: Three times a day (TID) | ORAL | 3 refills | Status: DC | PRN

## 2019-05-29 MED ORDER — GABAPENTIN 600 MG PO TABS: ORAL_TABLET | ORAL | 5 refills | Status: AC

## 2019-05-29 MED ORDER — DULOXETINE HCL 30 MG PO CPEP: 30.0000 mg | ORAL_CAPSULE | Freq: Every day | ORAL | 5 refills | Status: AC

## 2019-05-29 NOTE — Patient Instructions (Signed)
Guillain-Barr Syndrome Guillain-Barr syndrome (GBS) is a rare disorder in which the body's disease-fighting system (immune system) attacks the nerves. This causes numbness and tingling. It can eventually develop into a serious condition and, in some cases, cause paralysis. GBS does not spread from person to person (is not contagious). Most people with GBS recover within a few months. However, some people may have muscle weakness that lasts for a few years. What are the causes? The exact cause of GBS is not known. In most cases, GBS develops a few days or weeks after you have an infection, such as:  A stomach infection caused by Campylobacter bacteria. This type of infection usually causes diarrhea.  An infection of the nose, throat, and upper airways (respiratory infection), such as a cold or the flu.  A viral infection. Less commonly, GBS may be triggered by:  Surgery.  A vaccine. What are the signs or symptoms? The most common first symptoms are tingling, numbness, and weakness in the lower legs. Other symptoms may develop over hours, days, or weeks. These may include:  Muscle weakness that spreads from the legs to the abdomen and arms.  Aching or burning pain.  Weakness of muscles in the face.  Trouble chewing or swallowing.  Slurred speech.  Inability to move the arms, legs, or both (paralysis).  Difficulty breathing. How is this diagnosed? This condition may be diagnosed based on:  Your symptoms and medical history. Your health care provider will ask about any recent infections you have had.  A physical exam.  A test that measures how quickly your nerves carry signals to your muscles (nerve conduction velocity test).  Testing a sample of fluid that surrounds the spinal cord (lumbar puncture). How is this treated? If your symptoms are mild, you may be given medicines to control your symptoms. If your condition becomes severe, you may need to be treated at a hospital.  At the hospital, treatment may include:  Medicines. These help to improve numbness or tingling sensations in your arms and legs (paresthesias) caused by irritation of the nerves.  Plasma exchange (plasmapheresis). This is a procedure in which the immune system cells that are attacking your nerves are removed from your blood.  Getting proteins (immunoglobulins or antibodies) that slow down the immune system's attack on your nerves. These may be given through an IV inserted into one of your veins.  Oxygen and breathing assistance. Treatment may also include physical therapy to help strengthen your muscles. After being treated in the hospital, you may be transferred to a rehabilitation center to get intensive physical therapy. Once your strength improves, you may continue to get physical therapy at home. Additional physical therapy may be needed for many months. Follow these instructions at home:   If physical therapy was prescribed, do exercises as told by your health care provider.  Make sure you have the support you need. Someone may need to help you bathe, dress, and prepare meals until you regain your strength.  Rest as needed. Return to your normal activities as told by your health care provider. Ask your health care provider what activities are safe for you.  Take over-the-counter and prescription medicines only as told by your health care provider.  Keep all follow-up visits as told by your health care provider. This is important. Contact a health care provider if you:  Have new symptoms or your symptoms get worse.  Do not feel like you are getting better or regaining strength.  Do not feel safe   or supported at home.  Are having trouble coping with your recovery. Get help right away if you:  Have trouble breathing.  Have trouble swallowing.  Develop a fever.  Choke after eating or drinking.  Cannot move.  Faint.  Have pain, swelling, warmth, or redness in an arm or  leg.  Have chest pain. Summary  Guillain-Barr syndrome (GBS) is a rare disorder in which the body's disease-fighting system (immune system) attacks the nerves.  GBS often requires treatment at a hospital. Treatment in the hospital may include medicines, plasmapheresis, getting proteins that slow down the immune system's attack on your nerves, or oxygen and breathing assistance.  Months of physical therapy may be needed until you regain your strength. This information is not intended to replace advice given to you by your health care provider. Make sure you discuss any questions you have with your health care provider. Document Released: 05/28/2002 Document Revised: 06/12/2017 Document Reviewed: 06/12/2017 Elsevier Patient Education  2020 [employer].

## 2019-05-29 NOTE — Progress Notes (Signed)
Acute Office Visit  Subjective:    Patient ID: Catherine Butler, female    DOB: Dec 16, 1960, 58 y.o.   MRN: [id number]  Chief Complaint  Patient presents with  . Cyst    on leg   . Medication Refill    HPI Patient is in today for acute visit states her Catherine Butler is getting worst and having dizzy spells and difficulty walking. Patient missed last neurology appointment she was not aware  She had one. Today advised her to call neurologist to schedule an appointment. Cyst on leg self resolved.   Past Medical History:  Diagnosis Date  . Anxiety   . Arthritis   . Asthma   . Complication of anesthesia    nausea  . COPD (chronic obstructive pulmonary disease) (HCC)   . Depression   . Guillain-Barre (HCC) 2018  . H/O alcohol abuse   . Hypertension    pt denies  . PONV (postoperative nausea and vomiting)   . Reflux   . Suicide attempt by multiple drug overdose (HCC) 2014      Past Surgical History:  Procedure Laterality Date  . COLONOSCOPY WITH PROPOFOL N/A 07/21/2017   Procedure: COLONOSCOPY WITH PROPOFOL;  Surgeon: Rachael Fee, MD;  Location: [facility];  Service: Endoscopy;  Laterality: N/A;  . LAPAROSCOPY    . laporscopy surgery for ovarian cyst  20 years ago  . TONSILLECTOMY    . TUBAL LIGATION      Family History  Problem Relation Age of Onset  . Diabetes Father   . Hypertension Father 50       heart attack  . Kidney disease Father   . Heart attack Father   . Heart failure Father   . Dementia Father   . Breast cancer Mother 30       lump removed, bile duct cancer  . COPD Mother   . Hypertension Brother   . Allergic rhinitis Paternal Grandfather     Social History   Socioeconomic History  . Marital status: Single    Spouse name: Not on file  . Number of children: 1  . Years of education: Not on file  . Highest education level: High school graduate  Occupational History  . Occupation: unemployed    Comment: prior Holiday representative   Social Needs  . Financial resource strain: Not on file  . Food insecurity    Worry: Not on file    Inability: Not on file  . Transportation needs    Medical: Not on file    Non-medical: Not on file  Tobacco Use  . Smoking status: Current Every Day Smoker    Packs/day: 1.00    Years: 40.00    Pack years: 40.00    Types: Cigarettes  . Smokeless tobacco: Never Used  Substance and Sexual Activity  . Alcohol use: Yes    Frequency: Never    Comment: 1-2 glasses a month  . Drug use: No  . Sexual activity: Not Currently  Lifestyle  . Physical activity    Days per week: Not on file    Minutes per session: Not on file  . Stress: Not on file  Relationships  . Social Musician on phone: Not on file    Gets together: Not on file    Attends religious service: Not on file    Active member of club or organization: Not on file    Attends meetings of clubs or organizations: Not on  file    Relationship status: Not on file  . Intimate partner violence    Fear of current or ex partner: Not on file    Emotionally abused: Not on file    Physically abused: Not on file    Forced sexual activity: Not on file  Other Topics Concern  . Not on file  Social History Narrative   Lives with mom   Caffeine- tea during the day        Outpatient Medications Prior to Visit  Medication Sig Dispense Refill  . acetaminophen (TYLENOL) 500 MG tablet Take 1,000 mg by mouth 2 (two) times daily as needed for headache.    . albuterol (PROVENTIL HFA;VENTOLIN HFA) 108 (90 Base) MCG/ACT inhaler Inhale 2 puffs into the lungs every 6 (six) hours as needed for wheezing. 1 Inhaler 11  . albuterol (PROVENTIL) (2.5 MG/3ML) 0.083% nebulizer solution Take 3 mLs (2.5 mg total) by nebulization every 6 (six) hours as needed for wheezing or shortness of breath. 75 mL 4  . aspirin EC 81 MG tablet Take 1 tablet (81 mg total) by mouth daily. 90 tablet 3  . Cholecalciferol (VITAMIN D-3) 125 MCG (5000 UT) TABS 2 PO  qd x 3 months, then 1 PO qd after that. 180 tablet 1  . DULoxetine (CYMBALTA) 30 MG capsule Take 1 capsule (30 mg total) by mouth daily. 30 capsule 3  . esomeprazole (NEXIUM) 40 MG capsule TAKE 1 CAPSULE BY MOUTH ONCE DAILY BEFORE BREAKFAST 30 capsule 1  . ferrous sulfate 325 (65 FE) MG tablet Take 1 tablet (325 mg total) by mouth daily with breakfast. 30 tablet 6  . gabapentin (NEURONTIN) 300 MG capsule Take 2 capsules (600 mg total) by mouth 3 (three) times daily. 180 capsule 2  . gabapentin (NEURONTIN) 600 MG tablet TAKE 2 TABLETS BY MOUTH 3 TIMES DAILY 180 tablet 2  . hydrOXYzine (ATARAX/VISTARIL) 25 MG tablet Take 1 tablet (25 mg total) by mouth at bedtime. 30 tablet 1  . ibuprofen (ADVIL,MOTRIN) 200 MG tablet Take 400 mg by mouth every 6 (six) hours as needed for fever, headache, mild pain, moderate pain or cramping.    Marland Kitchen LINZESS 145 MCG CAPS capsule TAKE 1 CAPSULE BY MOUTH ONCE DAILY BEFORE BREAKFAST. TAKE ON AN EMPTY STOMACH. 30 capsule 1  . Magnesium Oxide 400 (240 Mg) MG TABS Take 1 tablet (400 mg total) by mouth 2 (two) times daily. 60 tablet 6  . mirtazapine (REMERON) 15 MG tablet Take 15 mg by mouth at bedtime.    . nicotine (NICODERM CQ - DOSED IN MG/24 HOURS) 21 mg/24hr patch Place 1 patch (21 mg total) onto the skin daily. 28 patch 1  . ondansetron (ZOFRAN) 4 MG tablet TAKE 1 TABLET BY MOUTH EVERY 8 HOURS AS NEEDED FOR NAUSEA AND VOMITING 20 tablet 0  . QUEtiapine (SEROQUEL) 400 MG tablet Take 400 mg by mouth at bedtime.     No facility-administered medications prior to visit.     Allergies  Allergen Reactions  . 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 Lipitor [Atorvastatin] Other (See Comments)    "really bad leg pain"    Review of Systems  Constitutional: Positive for malaise/fatigue.  Musculoskeletal: Positive for falls.       3-4 times uses a walker Unstable gait  Neurological: Positive for dizziness and weakness.  All other systems  reviewed and are negative.      Objective:    Physical Exam  Constitutional: She is oriented to person, place, and time. She appears well-developed and well-nourished.  HENT:  Head: Normocephalic.  Neck: Normal range of motion.  Cardiovascular: Normal rate and regular rhythm.  Pulmonary/Chest: Effort normal and breath sounds normal.  Abdominal: Soft. Bowel sounds are normal.  Musculoskeletal: Normal range of motion.  Neurological: She is oriented to person, place, and time.  Skin: Skin is warm and dry.  Psychiatric: She has a normal mood and affect.    BP 91/69 (BP Location: Left Arm, Patient Position: Sitting, Cuff Size: Small)   Pulse (!) 112   Temp (!) 97.2 F (36.2 C) (Temporal)   Ht 5\' 6"  (1.676 m)   Wt 139 lb 3.2 oz (63.1 kg)   LMP 02/22/2006   SpO2 97%   BMI 22.47 kg/m  Wt Readings from Last 3 Encounters:  05/29/19 139 lb 3.2 oz (63.1 kg)  08/30/18 150 lb 9.6 oz (68.3 kg)  07/25/18 150 lb 12.8 oz (68.4 kg)    Health Maintenance Due  Topic Date Due  . MAMMOGRAM  06/23/2014    There are no preventive care reminders to display for this patient.   Lab Results  Component Value Date   TSH 0.690 04/11/2018   Lab Results  Component Value Date   WBC 4.9 03/20/2019   HGB 12.8 03/20/2019   HCT 37.4 03/20/2019   MCV 92 03/20/2019   PLT 194 03/20/2019   Lab Results  Component Value Date   NA 140 06/02/2018   K 3.1 (L) 06/02/2018   CO2 17 (L) 06/02/2018   GLUCOSE 94 06/02/2018   BUN 6 06/02/2018   CREATININE 0.59 06/02/2018   BILITOT 0.5 06/02/2018   ALKPHOS 171 (H) 06/02/2018   AST 13 (L) 06/02/2018   ALT 9 06/02/2018   PROT 5.3 (L) 06/02/2018   ALBUMIN 2.3 (L) 06/02/2018   CALCIUM 7.7 (L) 06/02/2018   ANIONGAP 12 06/02/2018   Lab Results  Component Value Date   CHOL 265 (H) 03/28/2018   Lab Results  Component Value Date   HDL 68 03/28/2018   Lab Results  Component Value Date   LDLCALC Comment 03/28/2018   Lab Results  Component Value  Date   TRIG 965 (HH) 03/28/2018   Lab Results  Component Value Date   CHOLHDL 3.9 03/28/2018   Lab Results  Component Value Date   HGBA1C 4.8 04/13/2017       Assessment & Plan:  Catherine Butler was seen today for cyst and medication refill.  Diagnoses and all orders for this visit:  Guillain Barr syndrome (HCC)  Hypotension, unspecified hypotension type  Paresthesia -     gabapentin (NEURONTIN) 600 MG tablet; Take 2 tablets by mouth three times a day (Patient taking differently: Take 1,200 mg by mouth 3 (three) times daily. ) -     DULoxetine (CYMBALTA) 30 MG capsule; Take 1 capsule (30 mg total) by mouth daily.  Medication refill -     gabapentin (NEURONTIN) 600 MG tablet; Take 2 tablets by mouth three times a day (Patient taking differently: Take 1,200 mg by mouth 3 (three) times daily. )  Tobacco abuse She us made aware of increased risk for lung cancer and other respiratory diseases recommend cessation.  This will be reminded at each clinical visit.  Depression, unspecified depression type Manage on DULoxetine (CYMBALTA) 30 MG capsule; Take 1 capsule (30 mg total) by mouth daily. Dual purpose depression and pain   Other orders -     ondansetron (ZOFRAN-ODT) 4  MG disintegrating tablet; Take 1 tablet (4 mg total) by mouth every 8 (eight) hours as needed for nausea or vomiting.    No orders of the defined types were placed in this encounter.    Grayce Sessions, NP

## 2019-05-29 NOTE — Progress Notes (Signed)
Pt complains of dizzy spells

## 2019-06-01 ENCOUNTER — Other Ambulatory Visit: Payer: Self-pay

## 2019-06-01 ENCOUNTER — Inpatient Hospital Stay (HOSPITAL_COMMUNITY)
Admission: EM | Admit: 2019-06-01 | Discharge: 2019-06-04 | DRG: 683 | Disposition: A | Discharge status: Home Health | Payer: Medicaid Other | Attending: Internal Medicine | Admitting: Internal Medicine

## 2019-06-01 ENCOUNTER — Encounter (HOSPITAL_COMMUNITY): Payer: Self-pay | Admitting: *Deleted

## 2019-06-01 DIAGNOSIS — Z825 Family history of asthma and other chronic lower respiratory diseases: Secondary | ICD-10-CM

## 2019-06-01 DIAGNOSIS — N320 Bladder-neck obstruction: Secondary | ICD-10-CM | POA: Diagnosis present

## 2019-06-01 DIAGNOSIS — E872 Acidosis: Secondary | ICD-10-CM | POA: Diagnosis present

## 2019-06-01 DIAGNOSIS — R1310 Dysphagia, unspecified: Secondary | ICD-10-CM | POA: Diagnosis present

## 2019-06-01 DIAGNOSIS — K219 Gastro-esophageal reflux disease without esophagitis: Secondary | ICD-10-CM | POA: Diagnosis present

## 2019-06-01 DIAGNOSIS — I951 Orthostatic hypotension: Secondary | ICD-10-CM | POA: Diagnosis present

## 2019-06-01 DIAGNOSIS — D638 Anemia in other chronic diseases classified elsewhere: Secondary | ICD-10-CM | POA: Diagnosis present

## 2019-06-01 DIAGNOSIS — R079 Chest pain, unspecified: Secondary | ICD-10-CM | POA: Diagnosis present

## 2019-06-01 DIAGNOSIS — Z8249 Family history of ischemic heart disease and other diseases of the circulatory system: Secondary | ICD-10-CM

## 2019-06-01 DIAGNOSIS — Z20828 Contact with and (suspected) exposure to other viral communicable diseases: Secondary | ICD-10-CM | POA: Diagnosis present

## 2019-06-01 DIAGNOSIS — N179 Acute kidney failure, unspecified: Principal | ICD-10-CM | POA: Diagnosis present

## 2019-06-01 DIAGNOSIS — E871 Hypo-osmolality and hyponatremia: Secondary | ICD-10-CM | POA: Diagnosis present

## 2019-06-01 DIAGNOSIS — F329 Major depressive disorder, single episode, unspecified: Secondary | ICD-10-CM | POA: Diagnosis present

## 2019-06-01 DIAGNOSIS — Z841 Family history of disorders of kidney and ureter: Secondary | ICD-10-CM

## 2019-06-01 DIAGNOSIS — R748 Abnormal levels of other serum enzymes: Secondary | ICD-10-CM | POA: Diagnosis present

## 2019-06-01 DIAGNOSIS — Z888 Allergy status to other drugs, medicaments and biological substances status: Secondary | ICD-10-CM

## 2019-06-01 DIAGNOSIS — R338 Other retention of urine: Secondary | ICD-10-CM

## 2019-06-01 DIAGNOSIS — E860 Dehydration: Secondary | ICD-10-CM | POA: Diagnosis present

## 2019-06-01 DIAGNOSIS — Z800 Family history of malignant neoplasm of digestive organs: Secondary | ICD-10-CM

## 2019-06-01 DIAGNOSIS — J449 Chronic obstructive pulmonary disease, unspecified: Secondary | ICD-10-CM | POA: Diagnosis present

## 2019-06-01 DIAGNOSIS — N1339 Other hydronephrosis: Secondary | ICD-10-CM | POA: Diagnosis present

## 2019-06-01 DIAGNOSIS — R29898 Other symptoms and signs involving the musculoskeletal system: Secondary | ICD-10-CM | POA: Diagnosis present

## 2019-06-01 DIAGNOSIS — M6281 Muscle weakness (generalized): Secondary | ICD-10-CM | POA: Diagnosis present

## 2019-06-01 DIAGNOSIS — R296 Repeated falls: Secondary | ICD-10-CM | POA: Diagnosis present

## 2019-06-01 DIAGNOSIS — E8729 Other acidosis: Secondary | ICD-10-CM | POA: Diagnosis present

## 2019-06-01 DIAGNOSIS — Z79899 Other long term (current) drug therapy: Secondary | ICD-10-CM

## 2019-06-01 DIAGNOSIS — R319 Hematuria, unspecified: Secondary | ICD-10-CM | POA: Diagnosis present

## 2019-06-01 DIAGNOSIS — F17210 Nicotine dependence, cigarettes, uncomplicated: Secondary | ICD-10-CM | POA: Diagnosis present

## 2019-06-01 DIAGNOSIS — G650 Sequelae of Guillain-Barre syndrome: Secondary | ICD-10-CM | POA: Diagnosis present

## 2019-06-01 DIAGNOSIS — Z887 Allergy status to serum and vaccine status: Secondary | ICD-10-CM

## 2019-06-01 DIAGNOSIS — K5909 Other constipation: Secondary | ICD-10-CM | POA: Diagnosis present

## 2019-06-01 DIAGNOSIS — Z833 Family history of diabetes mellitus: Secondary | ICD-10-CM

## 2019-06-01 LAB — CBC
HCT: 31.3 % — ABNORMAL LOW (ref 36.0–46.0)
Hemoglobin: 10.2 g/dL — ABNORMAL LOW (ref 12.0–15.0)
MCH: 33.8 pg (ref 26.0–34.0)
MCHC: 32.6 g/dL (ref 30.0–36.0)
MCV: 103.6 fL — ABNORMAL HIGH (ref 80.0–100.0)
Platelets: 300 10*3/uL (ref 150–400)
RBC: 3.02 MIL/uL — ABNORMAL LOW (ref 3.87–5.11)
RDW: 13.2 % (ref 11.5–15.5)
WBC: 9.2 10*3/uL (ref 4.0–10.5)
nRBC: 0 % (ref 0.0–0.2)

## 2019-06-01 LAB — BASIC METABOLIC PANEL
Anion gap: 19 — ABNORMAL HIGH (ref 5–15)
BUN: 83 mg/dL — ABNORMAL HIGH (ref 6–20)
CO2: 16 mmol/L — ABNORMAL LOW (ref 22–32)
Calcium: 8.3 mg/dL — ABNORMAL LOW (ref 8.9–10.3)
Chloride: 92 mmol/L — ABNORMAL LOW (ref 98–111)
Creatinine, Ser: 8.75 mg/dL — ABNORMAL HIGH (ref 0.44–1.00)
GFR calc Af Amer: 5 mL/min — ABNORMAL LOW (ref >60)
GFR calc non Af Amer: 5 mL/min — ABNORMAL LOW (ref >60)
Glucose, Bld: 130 mg/dL — ABNORMAL HIGH (ref 70–99)
Potassium: 4.1 mmol/L (ref 3.5–5.1)
Sodium: 127 mmol/L — ABNORMAL LOW (ref 135–145)

## 2019-06-01 MED ORDER — SODIUM CHLORIDE 0.9% FLUSH: 3.0000 mL | Freq: Once | INTRAVENOUS | Status: DC

## 2019-06-01 NOTE — ED Triage Notes (Signed)
Pt reporting she has become dizzy and fell twice in the past week. Has had chest pain and SOB.  Pt is alert/oriented and pale in triage.

## 2019-06-01 NOTE — ED Provider Notes (Addendum)
[facility] Provider Note  CSN: [id number] Arrival date & time: 06/01/19 2011  Chief Complaint(s) Dizziness  HPI Patriciaann ClanKelly D Lepp is a 58 y.o. female with extensive past medical history listed below who presents to the emergency department with almost 1 week of lightheadedness upon standing.  Patient reports that she is had at least one episode of emesis for the past month.  Denies any fevers or chills.  Endorses mild shortness of breath for the past several days stating that she cannot feel like she is taking a big deep breath.  Reports that she is attempting to hydrate at home and drinks 2 20 ounce bottles of water a day.  Denies any recent alcohol use stating that she last drank 2 weeks ago.  Denies any chest pain.  No abdominal pain.  She does report lower abdominal distention.  States that she still makes urine.  Denies any dysuria.  Other than being Rx'd Zofran for nausea, she denies new medication.  The history is provided by the patient.    Past Medical History Past Medical History:  Diagnosis Date  . Anxiety   . Arthritis   . Asthma   . Complication of anesthesia    nausea  . COPD (chronic obstructive pulmonary disease) (HCC)   . Depression   . Guillain-Barre (HCC) 2018  . H/O alcohol abuse   . Hypertension    pt denies  . PONV (postoperative nausea and vomiting)   . Reflux   . Suicide attempt by multiple drug overdose [facility](HCC) 2014   Patient Active Problem List   Diagnosis Date Noted  . Acute renal failure (ARF) (HCC) 06/02/2019  . Acute urinary retention   . Fall 06/01/2018  . Acute metabolic encephalopathy 06/01/2018  . History of vertebral fracture 06/01/2018  . Closed fracture of spinous process of thoracic vertebra (HCC) 06/01/2018  . Thoracic compression fracture (HCC) 06/01/2018  . Lumbar compression fracture (HCC) 06/01/2018  . Closed rib fracture 06/01/2018  . Acute pancreatitis due to calculus of common bile duct 06/01/2018  . Rib  pain on left side   . Cough 04/25/2018  . CAP (community acquired pneumonia) 04/10/2018  . Protein-calorie malnutrition, severe 07/21/2017  . Colonic mass 07/18/2017  . Dehydration 07/18/2017  . Hypotension 07/18/2017  . Hypomagnesemia 07/18/2017  . History of prolonged Q-T interval on ECG 07/06/2017  . Tachycardia   . Generalized anxiety disorder   . Acute lower UTI   . Debility 06/23/2017  . Constipation   . Quadriplegia and quadriparesis (HCC)   . Guillain Barr syndrome (HCC)   . Nausea & vomiting 06/21/2017  . Hypokalemia 06/21/2017  . Volume depletion 06/21/2017  . Sinus tachycardia 06/21/2017  . Nausea and vomiting 06/21/2017  . Essential hypertension 04/14/2017  . Paresthesia   . 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 Abdominal pain 01/01/2017  . Sepsis (HCC) 01/01/2017  . Tobacco abuse 01/01/2017  . Abnormal LFTs 01/01/2017  . Depression   . COPD (chronic obstructive pulmonary disease) (HCC) 06/16/2015   Home Medication(s) Prior to Admission medications   Medication Sig Start Date End Date Taking? Authorizing Provider  acetaminophen (TYLENOL) 500 MG tablet Take 1,000 mg by mouth 2 (two) times daily as needed for headache.   Yes [provider]  albuterol (PROVENTIL HFA;VENTOLIN HFA) 108 (90 Base) MCG/ACT inhaler Inhale 2 puffs into the lungs every 6 (six) hours as needed for wheezing. 08/30/18  Yes Grayce SessionsEdwards, Michelle P, NP  albuterol (PROVENTIL) (2.5 MG/3ML) 0.083% nebulizer solution Take 3 mLs (2.5 mg total) by nebulization every 6 (six) hours as needed for wheezing or shortness of breath. 08/30/18  Yes Grayce Sessions, NP  aspirin EC 81 MG tablet Take 1 tablet (81 mg total) by mouth daily. 03/28/18  Yes Loletta Specter, PA-C  esomeprazole (NEXIUM) 40 MG capsule TAKE 1 CAPSULE BY MOUTH ONCE DAILY BEFORE BREAKFAST Patient taking differently: Take 40 mg by mouth daily.  04/16/19  Yes Rachael Fee, MD  gabapentin  (NEURONTIN) 600 MG tablet Take 2 tablets by mouth three times a day Patient taking differently: Take 1,200 mg by mouth 3 (three) times daily.  05/29/19  Yes Grayce Sessions, NP  ibuprofen (ADVIL,MOTRIN) 200 MG tablet Take 400 mg by mouth every 6 (six) hours as needed for fever, headache, mild pain, moderate pain or cramping.   Yes [provider]  LINZESS 145 MCG CAPS capsule TAKE 1 CAPSULE BY MOUTH ONCE DAILY BEFORE BREAKFAST. TAKE ON AN EMPTY STOMACH. Patient taking differently: Take 145 mcg by mouth daily before breakfast.  05/23/19  Yes Meredith Pel, NP  mirtazapine (REMERON) 15 MG tablet Take 15 mg by mouth at bedtime.   Yes [provider]  ondansetron (ZOFRAN-ODT) 4 MG disintegrating tablet Take 1 tablet (4 mg total) by mouth every 8 (eight) hours as needed for nausea or vomiting. 05/29/19  Yes Grayce Sessions, NP  QUEtiapine (SEROQUEL) 400 MG tablet Take 400 mg by mouth at bedtime.   Yes [provider]  DULoxetine (CYMBALTA) 30 MG capsule Take 1 capsule (30 mg total) by mouth daily. 05/29/19   Grayce Sessions, NP                                                                                                                                    Past Surgical History Past Surgical History:  Procedure Laterality Date  . COLONOSCOPY WITH PROPOFOL N/A 07/21/2017   Procedure: COLONOSCOPY WITH PROPOFOL;  Surgeon: Rachael Fee, MD;  Location: [facility];  Service: Endoscopy;  Laterality: N/A;  . LAPAROSCOPY    . laporscopy surgery for ovarian cyst  20 years ago  . TONSILLECTOMY    . TUBAL LIGATION     Family History Family History  Problem Relation Age of Onset  . Diabetes Father   . Hypertension Father 50       heart attack  . Kidney disease Father   . Heart attack Father   . Heart failure Father   . Dementia Father   . Breast cancer Mother 30       lump removed, bile duct cancer  . COPD Mother   . Hypertension Brother   . Allergic  rhinitis Paternal Grandfather     Social History Social History   Tobacco Use  . Smoking status: Current Every Day Smoker    Packs/day: 1.00    Years:  40.00    Pack years: 40.00    Types: Cigarettes  . Smokeless tobacco: Never Used  Substance Use Topics  . Alcohol use: Yes    Comment: 1-2 glasses a month  . Drug use: No   Allergies Influenza vaccines and Lipitor [atorvastatin]  Review of Systems Review of Systems All other systems are reviewed and are negative for acute change except as noted in the HPI  Physical Exam Vital Signs  I have reviewed the triage vital signs BP 90/72 (BP Location: Left Arm)   Pulse (!) 110   Temp 97.9 F (36.6 C) (Oral)   Resp 18   LMP 02/22/2006   SpO2 99%   Physical Exam Vitals reviewed.  Constitutional:      General: She is not in acute distress.    Appearance: She is well-developed. She is not diaphoretic.  HENT:     Head: Normocephalic and atraumatic.     Nose: Nose normal.  Eyes:     General: No scleral icterus.       Right eye: No discharge.        Left eye: No discharge.     Conjunctiva/sclera: Conjunctivae normal.     Pupils: Pupils are equal, round, and reactive to light.  Cardiovascular:     Rate and Rhythm: Normal rate and regular rhythm.     Heart sounds: No murmur. No friction rub. No gallop.   Pulmonary:     Effort: Pulmonary effort is normal. No respiratory distress.     Breath sounds: Normal breath sounds. No stridor. No rales.  Abdominal:     General: There is distension.     Palpations: Abdomen is soft.     Tenderness: There is no abdominal tenderness.  Musculoskeletal:        General: No tenderness.     Cervical back: Normal range of motion and neck supple.  Skin:    General: Skin is warm and dry.     Findings: No erythema or rash.  Neurological:     Mental Status: She is alert and oriented to person, place, and time.     ED Results and Treatments Labs (all labs ordered are listed, but only  abnormal results are displayed) Labs Reviewed  BASIC METABOLIC PANEL - Abnormal; Notable for the following components:      Result Value   Sodium 127 (*)    Chloride 92 (*)    CO2 16 (*)    Glucose, Bld 130 (*)    BUN 83 (*)    Creatinine, Ser 8.75 (*)    Calcium 8.3 (*)    GFR calc non Af Amer 5 (*)    GFR calc Af Amer 5 (*)    Anion gap 19 (*)    All other components within normal limits  CBC - Abnormal; Notable for the following components:   RBC 3.02 (*)    Hemoglobin 10.2 (*)    HCT 31.3 (*)    MCV 103.6 (*)    All other components within normal limits  RENAL FUNCTION PANEL - Abnormal; Notable for the following components:   Sodium 128 (*)    Chloride 93 (*)    CO2 17 (*)    Glucose, Bld 132 (*)    BUN 86 (*)    Creatinine, Ser 8.89 (*)    Calcium 8.3 (*)    Phosphorus 6.2 (*)    Albumin 1.8 (*)    GFR calc non Af Amer 4 (*)  GFR calc Af Amer 5 (*)    Anion gap 18 (*)    All other components within normal limits  HEPATIC FUNCTION PANEL - Abnormal; Notable for the following components:   Total Protein 5.6 (*)    Albumin 1.8 (*)    Alkaline Phosphatase 342 (*)    Total Bilirubin 1.7 (*)    Bilirubin, Direct 0.5 (*)    Indirect Bilirubin 1.2 (*)    All other components within normal limits  ACETAMINOPHEN LEVEL - Abnormal; Notable for the following components:   Acetaminophen (Tylenol), Serum <10 (*)    All other components within normal limits  MAGNESIUM - Abnormal; Notable for the following components:   Magnesium 3.0 (*)    All other components within normal limits  CBC - Abnormal; Notable for the following components:   RBC 2.80 (*)    Hemoglobin 9.5 (*)    HCT 28.8 (*)    MCV 102.9 (*)    All other components within normal limits  RENAL FUNCTION PANEL - Abnormal; Notable for the following components:   Sodium 132 (*)    Chloride 97 (*)    CO2 18 (*)    Glucose, Bld 103 (*)    BUN 73 (*)    Creatinine, Ser 7.06 (*)    Calcium 8.4 (*)    Phosphorus  5.1 (*)    Albumin 1.8 (*)    GFR calc non Af Amer 6 (*)    GFR calc Af Amer 7 (*)    Anion gap 17 (*)    All other components within normal limits  GAMMA GT - Abnormal; Notable for the following components:   GGT 325 (*)    All other components within normal limits  POCT I-STAT EG7 - Abnormal; Notable for the following components:   pH, Ven 7.486 (*)    pCO2, Ven 25.8 (*)    pO2, Ven 75.0 (*)    Bicarbonate 19.5 (*)    TCO2 20 (*)    Acid-base deficit 3.0 (*)    Sodium 126 (*)    Calcium, Ion 0.98 (*)    HCT 31.0 (*)    Hemoglobin 10.5 (*)    All other components within normal limits  POCT I-STAT EG7 - Abnormal; Notable for the following components:   pCO2, Ven 36.3 (*)    Sodium 127 (*)    Calcium, Ion 1.07 (*)    HCT 33.0 (*)    Hemoglobin 11.2 (*)    All other components within normal limits  SARS CORONAVIRUS 2 (TAT 6-24 HRS)  URINALYSIS, ROUTINE W REFLEX MICROSCOPIC  CREATININE, URINE, RANDOM  SALICYLATE LEVEL  NA AND K (SODIUM & POTASSIUM), RAND UR  BLOOD GAS, VENOUS  HIV ANTIBODY (ROUTINE TESTING W REFLEX)  RENAL FUNCTION PANEL  CBG MONITORING, ED  TROPONIN I (HIGH SENSITIVITY)  TROPONIN I (HIGH SENSITIVITY)  EKG  EKG Interpretation  Date/Time:  Friday June 01 2019 20:31:51 EST Ventricular Rate:  113 PR Interval:    QRS Duration: 72 QT Interval:  474 QTC Calculation: 650 R Axis:   9 Text Interpretation: ** Critical Test Result: Long QTc Sinus tachycardia Low voltage QRS Prolonged QT Abnormal ECG No STEMI Confirmed by Alvester Chou [phone]) on 06/01/2019 11:07:40 PM      Radiology US RENAL  Result Date: 06/02/2019 CLINICAL DATA:  Acute renal failure EXAM: RENAL / URINARY TRACT ULTRASOUND COMPLETE COMPARISON:  CT FINDINGS: Right Kidney: Renal measurements: 10.4 x 6.3 x 6.4 cm = volume: 218 mL. Mild to moderate  hydronephrosis. Left Kidney: Renal measurements: 10.5 x 5.6 x 5.2 cm = volume: 116 mL. Mild hydronephrosis Bladder: Bladder collapsed around Foley catheter. Other: Echogenic liver suggests hepatic steatosis IMPRESSION: 1. Bilateral hydronephrosis. Moderate on the RIGHT and mild on the LEFT. No obstructing lesion identified. 2. Bladder collapsed around Foley catheter. Electronically Signed   By: Genevive Bi M.D.   On: 06/02/2019 05:12    Pertinent labs & imaging results that were available during my care of the patient were reviewed by me and considered in my medical decision making (see chart for details).  Medications Ordered in ED Medications  sodium chloride flush (NS) 0.9 % injection 3 mL (3 mLs Intravenous Not Given 06/01/19 2329)  aspirin EC tablet 81 mg (has no administration in time range)  acetaminophen (TYLENOL) tablet 1,000 mg (1,000 mg Oral Given 06/02/19 0606)  DULoxetine (CYMBALTA) DR capsule 30 mg (has no administration in time range)  QUEtiapine (SEROQUEL) tablet 400 mg (400 mg Oral Not Given 06/02/19 0606)  pantoprazole (PROTONIX) EC tablet 40 mg (has no administration in time range)  linaclotide (LINZESS) capsule 145 mcg (has no administration in time range)  ondansetron (ZOFRAN-ODT) disintegrating tablet 4 mg (has no administration in time range)  ferrous sulfate tablet 325 mg (has no administration in time range)  albuterol (PROVENTIL) (2.5 MG/3ML) 0.083% nebulizer solution 2.5 mg (has no administration in time range)  gabapentin (NEURONTIN) tablet 600 mg (has no administration in time range)  heparin injection 5,000 Units (5,000 Units Subcutaneous Given 06/02/19 0606)  lactated ringers infusion ( Intravenous New Bag/Given 06/02/19 0605)  feeding supplement (ENSURE ENLIVE) (ENSURE ENLIVE) liquid 237 mL (has no administration in time range)  potassium chloride SA (KLOR-CON) CR tablet 40 mEq (has no administration in time range)  lactated ringers bolus 1,000 mL (has no  administration in time range)  sodium chloride 0.9 % bolus 1,000 mL (0 mLs Intravenous Stopped 06/02/19 0251)  fentaNYL (SUBLIMAZE) injection 25 mcg (25 mcg Intravenous Given 06/02/19 0244)  sodium chloride 0.9 % bolus 500 mL (500 mLs Intravenous New Bag/Given 06/02/19 0408)  Procedures .Critical Care Performed by: Nira Conn, MD Authorized by: Nira Conn, MD    CRITICAL CARE Performed by: Amadeo Garnet  Total critical care time: 35 minutes Critical care time was exclusive of separately billable procedures and treating other patients. Critical care was necessary to treat or prevent imminent or life-threatening deterioration. Critical care was time spent personally by me on the following activities: development of treatment plan with patient and/or surrogate as well as nursing, discussions with consultants, evaluation of patient's response to treatment, examination of patient, obtaining history from patient or surrogate, ordering and performing treatments and interventions, ordering and review of laboratory studies, ordering and review of radiographic studies, pulse oximetry and re-evaluation of patient's condition.   (including critical care time)  Medical Decision Making / ED Course I have reviewed the nursing notes for this encounter and the patient's prior records (if available in EHR or on provided paperwork).   YUMALAY CIRCLE was evaluated in Emergency Department on 06/02/2019 for the symptoms described in the history of present illness. She was evaluated in the context of the global COVID-19 pandemic, which necessitated consideration that the patient might be at risk for infection with the SARS-CoV-2 virus that causes COVID-19. Institutional protocols and algorithms that pertain to the evaluation of patients at risk for COVID-19  are in a state of rapid change based on information released by regulatory bodies including the CDC and federal and state organizations. These policies and algorithms were followed during the patient's care in the ED.  Patient with soft blood pressures.  Has abdominal distention which feels like her bladder.  Labs notable for renal failure.  Likely obstructive given bladder scan greater than 1000 cc.  Foley inserted. UA without evidence of infection. Admitted to medicine for further work up and management.     Final Clinical Impression(s) / ED Diagnoses Final diagnoses:  Acute urinary retention  Acute renal failure (ARF) (HCC)  Alkaline phosphatase elevation      This chart was dictated using voice recognition software.  Despite best efforts to proofread,  errors can occur which can change the documentation meaning.     Nira Conn, MD 06/02/19 [phone]

## 2019-06-02 ENCOUNTER — Observation Stay (HOSPITAL_COMMUNITY): Payer: Medicaid Other

## 2019-06-02 ENCOUNTER — Inpatient Hospital Stay (HOSPITAL_COMMUNITY): Payer: Medicaid Other

## 2019-06-02 DIAGNOSIS — J449 Chronic obstructive pulmonary disease, unspecified: Secondary | ICD-10-CM

## 2019-06-02 DIAGNOSIS — R296 Repeated falls: Secondary | ICD-10-CM | POA: Diagnosis present

## 2019-06-02 DIAGNOSIS — R319 Hematuria, unspecified: Secondary | ICD-10-CM

## 2019-06-02 DIAGNOSIS — R079 Chest pain, unspecified: Secondary | ICD-10-CM | POA: Diagnosis present

## 2019-06-02 DIAGNOSIS — K219 Gastro-esophageal reflux disease without esophagitis: Secondary | ICD-10-CM | POA: Diagnosis present

## 2019-06-02 DIAGNOSIS — G650 Sequelae of Guillain-Barre syndrome: Secondary | ICD-10-CM

## 2019-06-02 DIAGNOSIS — Z79899 Other long term (current) drug therapy: Secondary | ICD-10-CM

## 2019-06-02 DIAGNOSIS — N1339 Other hydronephrosis: Secondary | ICD-10-CM | POA: Diagnosis present

## 2019-06-02 DIAGNOSIS — E872 Acidosis: Secondary | ICD-10-CM | POA: Diagnosis present

## 2019-06-02 DIAGNOSIS — D649 Anemia, unspecified: Secondary | ICD-10-CM

## 2019-06-02 DIAGNOSIS — N1330 Unspecified hydronephrosis: Secondary | ICD-10-CM

## 2019-06-02 DIAGNOSIS — N320 Bladder-neck obstruction: Secondary | ICD-10-CM | POA: Diagnosis present

## 2019-06-02 DIAGNOSIS — E860 Dehydration: Secondary | ICD-10-CM | POA: Diagnosis present

## 2019-06-02 DIAGNOSIS — K644 Residual hemorrhoidal skin tags: Secondary | ICD-10-CM

## 2019-06-02 DIAGNOSIS — Z888 Allergy status to other drugs, medicaments and biological substances status: Secondary | ICD-10-CM | POA: Diagnosis not present

## 2019-06-02 DIAGNOSIS — R531 Weakness: Secondary | ICD-10-CM

## 2019-06-02 DIAGNOSIS — K5909 Other constipation: Secondary | ICD-10-CM

## 2019-06-02 DIAGNOSIS — I951 Orthostatic hypotension: Secondary | ICD-10-CM

## 2019-06-02 DIAGNOSIS — Z20828 Contact with and (suspected) exposure to other viral communicable diseases: Secondary | ICD-10-CM | POA: Diagnosis present

## 2019-06-02 DIAGNOSIS — R338 Other retention of urine: Secondary | ICD-10-CM | POA: Diagnosis present

## 2019-06-02 DIAGNOSIS — R1310 Dysphagia, unspecified: Secondary | ICD-10-CM | POA: Diagnosis present

## 2019-06-02 DIAGNOSIS — Z960 Presence of urogenital implants: Secondary | ICD-10-CM

## 2019-06-02 DIAGNOSIS — R748 Abnormal levels of other serum enzymes: Secondary | ICD-10-CM | POA: Diagnosis present

## 2019-06-02 DIAGNOSIS — E8729 Other acidosis: Secondary | ICD-10-CM | POA: Diagnosis present

## 2019-06-02 DIAGNOSIS — N179 Acute kidney failure, unspecified: Secondary | ICD-10-CM | POA: Diagnosis present

## 2019-06-02 DIAGNOSIS — F17210 Nicotine dependence, cigarettes, uncomplicated: Secondary | ICD-10-CM | POA: Diagnosis present

## 2019-06-02 DIAGNOSIS — D638 Anemia in other chronic diseases classified elsewhere: Secondary | ICD-10-CM | POA: Diagnosis present

## 2019-06-02 DIAGNOSIS — E871 Hypo-osmolality and hyponatremia: Secondary | ICD-10-CM

## 2019-06-02 DIAGNOSIS — F329 Major depressive disorder, single episode, unspecified: Secondary | ICD-10-CM | POA: Diagnosis present

## 2019-06-02 DIAGNOSIS — Z887 Allergy status to serum and vaccine status: Secondary | ICD-10-CM

## 2019-06-02 DIAGNOSIS — R202 Paresthesia of skin: Secondary | ICD-10-CM

## 2019-06-02 DIAGNOSIS — M6281 Muscle weakness (generalized): Secondary | ICD-10-CM | POA: Diagnosis present

## 2019-06-02 DIAGNOSIS — R339 Retention of urine, unspecified: Secondary | ICD-10-CM | POA: Diagnosis not present

## 2019-06-02 LAB — NA AND K (SODIUM & POTASSIUM), RAND UR
Potassium Urine: 21 mmol/L
Sodium, Ur: 17 mmol/L

## 2019-06-02 LAB — RENAL FUNCTION PANEL
Albumin: 1.8 g/dL — ABNORMAL LOW (ref 3.5–5.0)
Albumin: 1.8 g/dL — ABNORMAL LOW (ref 3.5–5.0)
Anion gap: 18 — ABNORMAL HIGH (ref 5–15)
Anion gap: 17 — ABNORMAL HIGH (ref 5–15)
BUN: 86 mg/dL — ABNORMAL HIGH (ref 6–20)
BUN: 73 mg/dL — ABNORMAL HIGH (ref 6–20)
CO2: 17 mmol/L — ABNORMAL LOW (ref 22–32)
CO2: 18 mmol/L — ABNORMAL LOW (ref 22–32)
Calcium: 8.3 mg/dL — ABNORMAL LOW (ref 8.9–10.3)
Calcium: 8.4 mg/dL — ABNORMAL LOW (ref 8.9–10.3)
Chloride: 93 mmol/L — ABNORMAL LOW (ref 98–111)
Chloride: 97 mmol/L — ABNORMAL LOW (ref 98–111)
Creatinine, Ser: 8.89 mg/dL — ABNORMAL HIGH (ref 0.44–1.00)
Creatinine, Ser: 7.06 mg/dL — ABNORMAL HIGH (ref 0.44–1.00)
GFR calc Af Amer: 5 mL/min — ABNORMAL LOW (ref >60)
GFR calc Af Amer: 7 mL/min — ABNORMAL LOW (ref >60)
GFR calc non Af Amer: 4 mL/min — ABNORMAL LOW (ref >60)
GFR calc non Af Amer: 6 mL/min — ABNORMAL LOW (ref >60)
Glucose, Bld: 132 mg/dL — ABNORMAL HIGH (ref 70–99)
Glucose, Bld: 103 mg/dL — ABNORMAL HIGH (ref 70–99)
Phosphorus: 6.2 mg/dL — ABNORMAL HIGH (ref 2.5–4.6)
Phosphorus: 5.1 mg/dL — ABNORMAL HIGH (ref 2.5–4.6)
Potassium: 5.1 mmol/L (ref 3.5–5.1)
Potassium: 3.5 mmol/L (ref 3.5–5.1)
Sodium: 128 mmol/L — ABNORMAL LOW (ref 135–145)
Sodium: 132 mmol/L — ABNORMAL LOW (ref 135–145)

## 2019-06-02 LAB — TROPONIN I (HIGH SENSITIVITY)
Troponin I (High Sensitivity): 10 ng/L (ref <18)
Troponin I (High Sensitivity): 8 ng/L (ref <18)
Troponin I (High Sensitivity): 6 ng/L (ref <18)
Troponin I (High Sensitivity): 6 ng/L (ref <18)

## 2019-06-02 LAB — CBC
HCT: 28.8 % — ABNORMAL LOW (ref 36.0–46.0)
HCT: 28.7 % — ABNORMAL LOW (ref 36.0–46.0)
Hemoglobin: 9.5 g/dL — ABNORMAL LOW (ref 12.0–15.0)
Hemoglobin: 9.4 g/dL — ABNORMAL LOW (ref 12.0–15.0)
MCH: 33.9 pg (ref 26.0–34.0)
MCH: 33.2 pg (ref 26.0–34.0)
MCHC: 33 g/dL (ref 30.0–36.0)
MCHC: 32.8 g/dL (ref 30.0–36.0)
MCV: 102.9 fL — ABNORMAL HIGH (ref 80.0–100.0)
MCV: 101.4 fL — ABNORMAL HIGH (ref 80.0–100.0)
Platelets: 306 10*3/uL (ref 150–400)
Platelets: 302 10*3/uL (ref 150–400)
RBC: 2.8 MIL/uL — ABNORMAL LOW (ref 3.87–5.11)
RBC: 2.83 MIL/uL — ABNORMAL LOW (ref 3.87–5.11)
RDW: 13.2 % (ref 11.5–15.5)
RDW: 13.2 % (ref 11.5–15.5)
WBC: 8.6 10*3/uL (ref 4.0–10.5)
WBC: 7 10*3/uL (ref 4.0–10.5)
nRBC: 0 % (ref 0.0–0.2)
nRBC: 0 % (ref 0.0–0.2)

## 2019-06-02 LAB — URINALYSIS, ROUTINE W REFLEX MICROSCOPIC
Bilirubin Urine: NEGATIVE
Glucose, UA: NEGATIVE mg/dL
Hgb urine dipstick: NEGATIVE
Ketones, ur: NEGATIVE mg/dL
Leukocytes,Ua: NEGATIVE
Nitrite: NEGATIVE
Protein, ur: NEGATIVE mg/dL
Specific Gravity, Urine: 1.011 (ref 1.005–1.030)
pH: 5 (ref 5.0–8.0)

## 2019-06-02 LAB — POCT I-STAT EG7
Acid-base deficit: 2 mmol/L (ref 0.0–2.0)
Acid-base deficit: 3 mmol/L — ABNORMAL HIGH (ref 0.0–2.0)
Bicarbonate: 19.5 mmol/L — ABNORMAL LOW (ref 20.0–28.0)
Bicarbonate: 22.3 mmol/L (ref 20.0–28.0)
Calcium, Ion: 0.98 mmol/L — ABNORMAL LOW (ref 1.15–1.40)
Calcium, Ion: 1.07 mmol/L — ABNORMAL LOW (ref 1.15–1.40)
HCT: 31 % — ABNORMAL LOW (ref 36.0–46.0)
HCT: 33 % — ABNORMAL LOW (ref 36.0–46.0)
Hemoglobin: 10.5 g/dL — ABNORMAL LOW (ref 12.0–15.0)
Hemoglobin: 11.2 g/dL — ABNORMAL LOW (ref 12.0–15.0)
O2 Saturation: 62 %
O2 Saturation: 96 %
Potassium: 4 mmol/L (ref 3.5–5.1)
Potassium: 4.8 mmol/L (ref 3.5–5.1)
Sodium: 126 mmol/L — ABNORMAL LOW (ref 135–145)
Sodium: 127 mmol/L — ABNORMAL LOW (ref 135–145)
TCO2: 20 mmol/L — ABNORMAL LOW (ref 22–32)
TCO2: 23 mmol/L (ref 22–32)
pCO2, Ven: 25.8 mmHg — ABNORMAL LOW (ref 44.0–60.0)
pCO2, Ven: 36.3 mmHg — ABNORMAL LOW (ref 44.0–60.0)
pH, Ven: 7.396 (ref 7.250–7.430)
pH, Ven: 7.486 — ABNORMAL HIGH (ref 7.250–7.430)
pO2, Ven: 32 mmHg (ref 32.0–45.0)
pO2, Ven: 75 mmHg — ABNORMAL HIGH (ref 32.0–45.0)

## 2019-06-02 LAB — BASIC METABOLIC PANEL
Anion gap: 12 (ref 5–15)
BUN: 51 mg/dL — ABNORMAL HIGH (ref 6–20)
CO2: 18 mmol/L — ABNORMAL LOW (ref 22–32)
Calcium: 8.3 mg/dL — ABNORMAL LOW (ref 8.9–10.3)
Chloride: 106 mmol/L (ref 98–111)
Creatinine, Ser: 3.81 mg/dL — ABNORMAL HIGH (ref 0.44–1.00)
GFR calc Af Amer: 14 mL/min — ABNORMAL LOW (ref >60)
GFR calc non Af Amer: 12 mL/min — ABNORMAL LOW (ref >60)
Glucose, Bld: 119 mg/dL — ABNORMAL HIGH (ref 70–99)
Potassium: 3.4 mmol/L — ABNORMAL LOW (ref 3.5–5.1)
Sodium: 136 mmol/L (ref 135–145)

## 2019-06-02 LAB — ACETAMINOPHEN LEVEL: Acetaminophen (Tylenol), Serum: <10 ug/mL — ABNORMAL LOW (ref 10–30)

## 2019-06-02 LAB — HIV ANTIBODY (ROUTINE TESTING W REFLEX): HIV Screen 4th Generation wRfx: NONREACTIVE

## 2019-06-02 LAB — GAMMA GT: GGT: 325 U/L — ABNORMAL HIGH (ref 7–50)

## 2019-06-02 LAB — MAGNESIUM: Magnesium: 3 mg/dL — ABNORMAL HIGH (ref 1.7–2.4)

## 2019-06-02 LAB — HEPATIC FUNCTION PANEL
ALT: 22 U/L (ref 0–44)
AST: 24 U/L (ref 15–41)
Albumin: 1.8 g/dL — ABNORMAL LOW (ref 3.5–5.0)
Alkaline Phosphatase: 342 U/L — ABNORMAL HIGH (ref 38–126)
Bilirubin, Direct: 0.5 mg/dL — ABNORMAL HIGH (ref 0.0–0.2)
Indirect Bilirubin: 1.2 mg/dL — ABNORMAL HIGH (ref 0.3–0.9)
Total Bilirubin: 1.7 mg/dL — ABNORMAL HIGH (ref 0.3–1.2)
Total Protein: 5.6 g/dL — ABNORMAL LOW (ref 6.5–8.1)

## 2019-06-02 LAB — TYPE AND SCREEN
ABO/RH(D): A POS
Antibody Screen: NEGATIVE

## 2019-06-02 LAB — SALICYLATE LEVEL: Salicylate Lvl: <7 mg/dL (ref 2.8–30.0)

## 2019-06-02 LAB — ABO/RH: ABO/RH(D): A POS

## 2019-06-02 LAB — CREATININE, URINE, RANDOM: Creatinine, Urine: 67.34 mg/dL

## 2019-06-02 LAB — SARS CORONAVIRUS 2 (TAT 6-24 HRS): SARS Coronavirus 2: NEGATIVE

## 2019-06-02 MED ORDER — SODIUM CHLORIDE 0.9 % IV BOLUS
1000.0000 mL | Freq: Once | INTRAVENOUS | Status: AC
  Administered 2019-06-02: 1000 mL via INTRAVENOUS

## 2019-06-02 MED ORDER — PANTOPRAZOLE SODIUM 40 MG PO TBEC
40.0000 mg | DELAYED_RELEASE_TABLET | Freq: Every day | ORAL | Status: DC
  Administered 2019-06-02 – 2019-06-04 (×3): 40 mg via ORAL
  Filled 2019-06-02 (×3): qty 1

## 2019-06-02 MED ORDER — ACETAMINOPHEN 500 MG PO TABS
1000.0000 mg | ORAL_TABLET | Freq: Two times a day (BID) | ORAL | Status: DC | PRN
  Administered 2019-06-02 – 2019-06-04 (×4): 1000 mg via ORAL
  Filled 2019-06-02 (×4): qty 2

## 2019-06-02 MED ORDER — ONDANSETRON 4 MG PO TBDP
4.0000 mg | ORAL_TABLET | Freq: Three times a day (TID) | ORAL | Status: DC | PRN
  Administered 2019-06-03 – 2019-06-04 (×2): 4 mg via ORAL
  Filled 2019-06-02 (×2): qty 1

## 2019-06-02 MED ORDER — ENSURE ENLIVE PO LIQD
237.0000 mL | Freq: Two times a day (BID) | ORAL | Status: DC
  Administered 2019-06-02 – 2019-06-04 (×5): 237 mL via ORAL

## 2019-06-02 MED ORDER — HEPARIN SODIUM (PORCINE) 5000 UNIT/ML IJ SOLN
5000.0000 [IU] | Freq: Three times a day (TID) | INTRAMUSCULAR | Status: DC
  Administered 2019-06-02 – 2019-06-04 (×7): 5000 [IU] via SUBCUTANEOUS
  Filled 2019-06-02 (×7): qty 1

## 2019-06-02 MED ORDER — FENTANYL CITRATE (PF) 100 MCG/2ML IJ SOLN
25.0000 ug | Freq: Once | INTRAMUSCULAR | Status: AC
  Administered 2019-06-02: 03:00:00 25 ug via INTRAVENOUS
  Filled 2019-06-02: qty 2

## 2019-06-02 MED ORDER — ALBUTEROL SULFATE HFA 108 (90 BASE) MCG/ACT IN AERS: 2.0000 | INHALATION_SPRAY | Freq: Four times a day (QID) | RESPIRATORY_TRACT | Status: DC | PRN

## 2019-06-02 MED ORDER — DULOXETINE HCL 30 MG PO CPEP
30.0000 mg | ORAL_CAPSULE | Freq: Every day | ORAL | Status: DC
  Administered 2019-06-02 – 2019-06-04 (×3): 30 mg via ORAL
  Filled 2019-06-02 (×3): qty 1

## 2019-06-02 MED ORDER — GABAPENTIN 600 MG PO TABS
600.0000 mg | ORAL_TABLET | Freq: Three times a day (TID) | ORAL | Status: DC
  Administered 2019-06-02 – 2019-06-04 (×8): 600 mg via ORAL
  Filled 2019-06-02 (×8): qty 1

## 2019-06-02 MED ORDER — LACTATED RINGERS IV SOLN
INTRAVENOUS | Status: DC
  Administered 2019-06-02 – 2019-06-04 (×5): via INTRAVENOUS

## 2019-06-02 MED ORDER — LACTATED RINGERS IV BOLUS
1000.0000 mL | Freq: Once | INTRAVENOUS | Status: AC
  Administered 2019-06-02: 1000 mL via INTRAVENOUS

## 2019-06-02 MED ORDER — POTASSIUM CHLORIDE CRYS ER 20 MEQ PO TBCR
40.0000 meq | EXTENDED_RELEASE_TABLET | Freq: Two times a day (BID) | ORAL | Status: AC
  Administered 2019-06-02 (×2): 40 meq via ORAL
  Filled 2019-06-02 (×2): qty 2

## 2019-06-02 MED ORDER — SODIUM CHLORIDE 0.9 % IV BOLUS
500.0000 mL | Freq: Once | INTRAVENOUS | Status: AC
  Administered 2019-06-02: 500 mL via INTRAVENOUS

## 2019-06-02 MED ORDER — ASPIRIN EC 81 MG PO TBEC
81.0000 mg | DELAYED_RELEASE_TABLET | Freq: Every day | ORAL | Status: DC
  Administered 2019-06-02 – 2019-06-04 (×3): 81 mg via ORAL
  Filled 2019-06-02 (×3): qty 1

## 2019-06-02 MED ORDER — QUETIAPINE FUMARATE 400 MG PO TABS
400.0000 mg | ORAL_TABLET | Freq: Every day | ORAL | Status: DC
  Administered 2019-06-02 – 2019-06-03 (×2): 400 mg via ORAL
  Filled 2019-06-02 (×4): qty 1

## 2019-06-02 MED ORDER — FERROUS SULFATE 325 (65 FE) MG PO TABS
325.0000 mg | ORAL_TABLET | Freq: Every day | ORAL | Status: DC
  Administered 2019-06-02 – 2019-06-04 (×3): 325 mg via ORAL
  Filled 2019-06-02 (×3): qty 1

## 2019-06-02 MED ORDER — ALBUTEROL SULFATE (2.5 MG/3ML) 0.083% IN NEBU
2.5000 mg | INHALATION_SOLUTION | Freq: Four times a day (QID) | RESPIRATORY_TRACT | Status: DC | PRN
  Administered 2019-06-03: 2.5 mg via RESPIRATORY_TRACT
  Filled 2019-06-02: qty 3

## 2019-06-02 MED ORDER — LINACLOTIDE 145 MCG PO CAPS
145.0000 ug | ORAL_CAPSULE | Freq: Every day | ORAL | Status: DC
  Administered 2019-06-02 – 2019-06-04 (×3): 145 ug via ORAL
  Filled 2019-06-02 (×4): qty 1

## 2019-06-02 NOTE — Progress Notes (Addendum)
Subjective:   Catherine Butler was examined at bedside this morning and she endorses disguisea. She also reports of chest pain, centrally located she endorses nausea that has been going on for the past hour.  She appears very uncomfortable, frequently clutching her chest and pain.  The patient also has blood in her urine was not present when she first came to the hospital.  Objective: CBC Latest Ref Rng & Units 06/02/2019 06/02/2019 06/02/2019  WBC 4.0 - 10.5 K/uL 8.6 - -  Hemoglobin 12.0 - 15.0 g/dL 9.5(L) 11.2(L) 10.5(L)  Hematocrit 36.0 - 46.0 % 28.8(L) 33.0(L) 31.0(L)  Platelets 150 - 400 K/uL 306 - -   BMP Latest Ref Rng & Units 06/02/2019 06/02/2019 06/02/2019  Glucose 70 - 99 mg/dL 103(H) - -  BUN 6 - 20 mg/dL 73(H) - -  Creatinine 0.44 - 1.00 mg/dL 7.06(H) - -  BUN/Creat Ratio 9 - 23 - - -  Sodium 135 - 145 mmol/L 132(L) 127(L) 126(L)  Potassium 3.5 - 5.1 mmol/L 3.5 4.0 4.8  Chloride 98 - 111 mmol/L 97(L) - -  CO2 22 - 32 mmol/L 18(L) - -  Calcium 8.9 - 10.3 mg/dL 8.4(L) - -   Renal ultrasound: IMPRESSION: 1. Bilateral hydronephrosis. Moderate on the RIGHT and mild on the LEFT. No obstructing lesion identified. 2. Bladder collapsed around Foley catheter.  Vital signs in last 24 hours: Vitals:   06/02/19 0333 06/02/19 0335 06/02/19 0829 06/02/19 1013  BP:  (!) 130/92 114/79 121/87  Pulse:  (!) 106 (!) 103   Resp:   18 18  Temp:  98.3 F (36.8 C) 98.5 F (36.9 C) 98.3 F (36.8 C)  TempSrc:   Oral Oral  SpO2:  99% 95% 97%  Weight: 60.6 kg     Height: '5\' 6"'$  (1.676 m)      Physical Exam General: Uncomfortable appearing, HEENT: NCAT, pale conjunctiva bilaterally  CV: tachycardia, Normal S1 & S2, no murmurs rubs or gallops PULM: coughing, CTAB, no crackles or wheezing appreciated ABD: Suprapubic tenderness Urinary: Hematuria,pink with red swirls in urine  Skin: Pale appearing NEURO: alert, no focal deficits noted  Assessment/Plan:  Principal Problem:   Acute renal  failure (ARF) (HCC) Active Problems:   Bilateral leg weakness - chronic, after Guillian Barre episode   Acute urinary retention   High anion gap metabolic acidosis   Hyponatremia  Summary, Catherine. 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   #Hematuria #AKI secondary to urinary retention: Cr. 8.89 on admission and has started to downtrend to 7.06 on the last BMP. (BL normal kidney function). Renal ultrasound significant for bilateral hydronephrosis, suggesting obstruction but no obstructing lesion identitied. Patient reportedly had episode of urinary retention in 2018 and had an extensive neurologic work-up that was unremarkable. Neurology has seen the patient and feels her symptoms are most consistent with atypical GBS exacerbation, specially given the fact that the patient's reflexes are intact. Once the Foley was placed in the ED, the patient immediately diuresed 1.5 L. Has diuresed 4.2 L total today. Urine is pink in color currently, and was not normal when she came in. Possible that Foley insertion because traumatic injury to the urethra.  We will continue to monitor. -Likely has bladder stretch injury, continue foley -Strict I/O -Follow up with urology -Repeat BMP ordered  #New onset chest pain:  Patient states that her chest pain started about an hour before we came to see her this morning.  Although I could not appreciate a friction rub on exam, I suspect she may have pericarditis secondary to uremia. No ST changes in EKG -f/u troponin  -Continue to monitor  #Orthostatic hypotension:  -Orthostatic vitals ordered -PT/OT consults ordered  #Anemia: Skin appears pale and has pale conjunctiva on exam.Hgb was 11.2 then dropped to 9.5. -repeat CBC ordered -ferrous sulfate tablet 325 mg daily   #Elevated alk  phos:  -f/u RUQ u/s  #GERD -Protonix 40 mg daily  -zofran '4mg'$  q8hrs PRN  #Depression -Seroquel 400 mg daily -Cymbalta 30 mg daily  #Chornic Constipation  -Linaclotide 145 mcg daily   #FEN/GI -Diet: regular + Ensure supplement between meals -Fluids: LR 75 cc/hr  #DVT prophylaxis -Heparin 5000 units subq injections every 8 hours  #Dispo: Anticipated discharge pending medical course.  [name], MD Internal Medicine, PGY1 Pager: [phone]  06/02/2019,12:55 PM

## 2019-06-02 NOTE — ED Notes (Signed)
Report attempted 

## 2019-06-02 NOTE — Progress Notes (Signed)
PT Cancellation Note  Patient Details Name: Catherine Butler MRN: [id number] DOB: 02-22-1961   Cancelled Treatment:    Reason Eval/Treat Not Completed: Patient at procedure or test/unavailable.  Has had recent chest pain and is being seen by another staff member.  Retry at another time when medically ready.   Ramond Dial 06/02/2019, 11:59 AM   Mee Hives, PT MS Acute Rehab Dept. Number: [place] [facility] [place]

## 2019-06-02 NOTE — Progress Notes (Signed)
Initial Nutrition Assessment  DOCUMENTATION CODES:   Not applicable  INTERVENTION:  -Ensure Enlive po BID, each supplement provides 350 kcal and 20 grams of protein  -MVI with minerals daily  NUTRITION DIAGNOSIS:   Increased nutrient needs related to acute illness(acute renal failure) as evidenced by estimated needs.  GOAL:   Patient will meet greater than or equal to 90% of their needs    MONITOR:   PO intake, Labs, Weight trends, Skin, I & O's  REASON FOR ASSESSMENT:   Malnutrition Screening Tool    ASSESSMENT:  RD working remotely.  58 year old female with history of depression with h/o suicide attempt, ETOH abuse, COPD, asthma, HTN, h/o atypical GBS with residual weakness who presents with 1 week history of lightheadedness with standing and frequent falls, lower abdominal pain, back pain, and dysuria. Patient seen by PCP 4 days prior to presentation who recommended increasing fluid intake, prescribed zofran for intermittent nausea, and increased Gabapentin to help with paresthesias. In ED, pt noted with significantly distended bladder, bladder scan showed retention of approximately 1L and Foley catheter placed and acute renal failure.  Unable to reach pt via phone this morning, per chart review pt with sonographer for ultrasound. No nutrition history able to be obtained at this time. Patient recently admitted, no recorded meals for review. Will continue to monitor for po intake and provide Ensure to aid with calorie/protein needs.   I/Os: -2917 ml since admit UOP: 4200 ml since admit  Current wt 60.6 kg (133.3 lbs)  Patient has lost 17 lbs (11.3%) over the past 9 months per review of history which is severe for time frame.  Medications reviewed and include: ferrous sulfate, gabapentin, protonix, klor-con, lactated ringers  Labs: Na 132 (L), Cr 7.06 (H), BUN 73 (H), Phos 5.1 (H), Hgb 9.5 (L)  NUTRITION - FOCUSED PHYSICAL EXAM: Unable to complete at this time, RD  working remotely.  Diet Order:   Diet Order            Diet regular Room service appropriate? No; Fluid consistency: Thin  Diet effective now              EDUCATION NEEDS:   No education needs have been identified at this time  Skin:  Skin Assessment: Reviewed RN Assessment  Last BM:  12/5  Height:   Ht Readings from Last 1 Encounters:  06/02/19 5\' 6"  (1.676 m)    Weight:   Wt Readings from Last 1 Encounters:  06/02/19 60.6 kg    Ideal Body Weight:  59.1 kg  BMI:  Body mass index is 21.56 kg/m.  Estimated Nutritional Needs:   Kcal:  1700-1900  Protein:  85-95  Fluid:  >/= 1.7 L/day   Lajuan Lines, RD, LDN Clinical Nutrition [facility] Telephone After Hours/Weekend Pager: [phone]

## 2019-06-02 NOTE — H&P (Addendum)
Date: 06/02/2019               Patient Name:  Catherine Butler MRN: [id number]  DOB: [birth year] Age / Sex: 58 y.o., female   PCP: Kerin Perna, NP         Medical Service: Internal Medicine Teaching Service         Attending Physician: Dr. Evette Doffing, Mallie Mussel, *    First Contact: Dr. Sheppard Coil Pager: [phone]  Second Contact: Dr. Eileen Stanford  Pager: [phone]       After Hours (After 5p/  First Contact Pager: [phone]  weekends / holidays): Second Contact Pager: [phone]   Chief Complaint: lightheadedness with standing   History of Present Illness: Catherine Butler is a 58 y/o female with history of depression, COPD, remote history of atypical GBS with residual weakness, paresthesias who presents with 1 week history of lightheadedness with standing and frequent falls. This has been a recurrent issue for her when her "GBS flares." She was seen by her PCP 4 days ago who recommended increasing fluid intake to help with her low blood pressure, prescribed zofran for intermittent nausea, and increased her Gabapentin to help with paresthesias.   She also complains of dysuria and back pain for the last week. Complains of associated lower abdominal pain described as intermittent spasms. She denies fevers, chills, hematuria, incomplete emptying, or difficulty urinating. No recent URI symptoms, chest pain, shortness of breath, changes in bowel movements.   Meds:  Current Meds  Medication Sig   acetaminophen (TYLENOL) 500 MG tablet Take 1,000 mg by mouth 2 (two) times daily as needed for headache.   albuterol (PROVENTIL HFA;VENTOLIN HFA) 108 (90 Base) MCG/ACT inhaler Inhale 2 puffs into the lungs every 6 (six) hours as needed for wheezing.   albuterol (PROVENTIL) (2.5 MG/3ML) 0.083% nebulizer solution Take 3 mLs (2.5 mg total) by nebulization every 6 (six) hours as needed for wheezing or shortness of breath.   aspirin EC 81 MG tablet Take 1 tablet (81 mg total) by mouth daily.   esomeprazole (NEXIUM)  40 MG capsule TAKE 1 CAPSULE BY MOUTH ONCE DAILY BEFORE BREAKFAST (Patient taking differently: Take 40 mg by mouth daily. )   gabapentin (NEURONTIN) 600 MG tablet Take 2 tablets by mouth three times a day (Patient taking differently: Take 1,200 mg by mouth 3 (three) times daily. )   ibuprofen (ADVIL,MOTRIN) 200 MG tablet Take 400 mg by mouth every 6 (six) hours as needed for fever, headache, mild pain, moderate pain or cramping.   LINZESS 145 MCG CAPS capsule TAKE 1 CAPSULE BY MOUTH ONCE DAILY BEFORE BREAKFAST. TAKE ON AN EMPTY STOMACH. (Patient taking differently: Take 145 mcg by mouth daily before breakfast. )   mirtazapine (REMERON) 15 MG tablet Take 15 mg by mouth at bedtime.   ondansetron (ZOFRAN-ODT) 4 MG disintegrating tablet Take 1 tablet (4 mg total) by mouth every 8 (eight) hours as needed for nausea or vomiting.   QUEtiapine (SEROQUEL) 400 MG tablet Take 400 mg by mouth at bedtime.     Allergies: Allergies as of 06/01/2019 - Review Complete 05/29/2019  Allergen Reaction Noted   Influenza vaccines Other (See Comments) 04/10/2018   Lipitor [atorvastatin] Other (See Comments) 04/11/2017   Past Medical History:  Diagnosis Date   Anxiety    Arthritis    Asthma    Complication of anesthesia    nausea   COPD (chronic obstructive pulmonary disease) ([place])    Depression    Guillain-Barre ([place])  2018   H/O alcohol abuse    Hypertension    pt denies   PONV (postoperative nausea and vomiting)    Reflux    Suicide attempt by multiple drug overdose ([place]) 2014    Family History:  Family History  Problem Relation Age of Onset   Diabetes Father    Hypertension Father 36       heart attack   Kidney disease Father    Heart attack Father    Heart failure Father    Dementia Father    Breast cancer Mother 42       lump removed, bile duct cancer   COPD Mother    Hypertension Brother    Allergic rhinitis Paternal Grandfather      Social History: lives at home alone, able to care  for herself. Endorses heavy alcohol use "several years ago", currently drinks a glass of vodka a few times/week. Has smoked 1 ppd for the last 89+ years, no illicit drug use.   Review of Systems: A complete ROS was negative except as per HPI.   Physical Exam: Blood pressure 121/89, pulse (!) 110, temperature 98 F (36.7 C), resp. rate 13, height _0  (1.676 m), weight 63.1 kg, last menstrual period 02/22/2006, SpO2 95 %. Physical Exam Constitutional:      General: She is not in acute distress.    Comments: Appears older than stated age   HENT:     Head: Normocephalic and atraumatic.  Eyes:     Extraocular Movements: Extraocular movements intact.     Conjunctiva/sclera: Conjunctivae normal.     Pupils: Pupils are equal, round, and reactive to light.  Cardiovascular:     Rate and Rhythm: Tachycardia present.     Heart sounds: Normal heart sounds.  Pulmonary:     Effort: Pulmonary effort is normal.     Breath sounds: Wheezing present.  Abdominal:     General: Abdomen is flat. Bowel sounds are normal.     Palpations: Abdomen is soft.     Tenderness: There is abdominal tenderness in the suprapubic area. There is no guarding or rebound.     Comments: Scattered ecchymoses from recent fall   Genitourinary:    Rectum: External hemorrhoid present. No tenderness. Normal anal tone.  Musculoskeletal:        General: No swelling.     Cervical back: Neck supple.  Skin:    General: Skin is warm and dry.  Neurological:     Mental Status: She is alert and oriented to person, place, and time.     Cranial Nerves: Cranial nerves are intact.     Motor: Motor function is intact.     Deep Tendon Reflexes:     Reflex Scores:      Tricep reflexes are 2+ on the right side and 2+ on the left side.      Bicep reflexes are 2+ on the right side and 2+ on the left side.      Patellar reflexes are 1+ on the right side and 1+ on the left side.      Achilles reflexes are 2+ on the right side and 2+ on the  left side.    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 In the ED she was found to be tachycardic with blood pressures on low end of normal, saturating well on room air. Work-up revealed patient was in acute renal failure with BUN of 83, crt of 1.75. Bladder was significantly distended on initial exam and bladder scan showed retention of approximately 1L. Foley catheter was subsequently placed. So far she has diuresed approximately 3600 ml of urine.   AKI 2/2 acute urinary retention - U/A was negative for infection. Does not appear to have any medications that typically cause urinary retention. Most likely etiology seems to be neurologic impairment. She had similar presentation in 2018 at which time she had extensive neurologic work-up that was unremarkable. Neurology felt presentation was most consistent with atypical GBS. Spoke with Dr. Leonel Ramsay. Given that her patellar reflexes are present, felt this was a recrudescence of previous syndrome and that no further work-up was needed at this time.  - given significant bladder stretch injury, will plan to maintain foley with urology follow-up in a couple of weeks  - will administer IVF and monitor electrolytes to avoid complications of post-obstructive diuresis   Orthostatic hypotension  - seems to be chronic issue; PCP felt this was related to dehydration and encouraged her to increase fluid intake - will re-check orthostatics after IVF - PT/OT eval    Elevated alk phos - acute on chronic in the setting of ARF - will check GGT to rule out hepatobiliary etiology; CT scan 1 year ago did not show any liver abnormality, gallbladder wall thickening or biliary dilation   Depression: continued home Seroquel and cymbalta  Chronic constipation: continued Linzess   Dispo: Admit patient to Inpatient with expected length of stay greater than 2 midnights.  SignedDelice Bison, DO 06/02/2019, 3:07 AM  Pager: [phone]

## 2019-06-02 NOTE — Progress Notes (Signed)
OT Cancellation Note  Patient Details Name: Catherine Butler MRN: [id number] DOB: April 23, 1961   Cancelled Treatment:    Reason Eval/Treat Not Completed: Medical issues which prohibited therapy pt with chest pain, RN aware and requests OT to hold at this time.  Will follow and see as able and medically appropriate.   Jolaine Artist, OT Acute Rehabilitation Services Pager [phone] Office [phone]   Delight Stare 06/02/2019, 11:54 AM

## 2019-06-03 DIAGNOSIS — R339 Retention of urine, unspecified: Secondary | ICD-10-CM

## 2019-06-03 LAB — RETICULOCYTES
Immature Retic Fract: 15.4 % (ref 2.3–15.9)
RBC.: 2.41 MIL/uL — ABNORMAL LOW (ref 3.87–5.11)
Retic Count, Absolute: 30.4 10*3/uL (ref 19.0–186.0)
Retic Ct Pct: 1.3 % (ref 0.4–3.1)

## 2019-06-03 LAB — BASIC METABOLIC PANEL
Anion gap: 10 (ref 5–15)
Anion gap: 11 (ref 5–15)
BUN: 30 mg/dL — ABNORMAL HIGH (ref 6–20)
BUN: 25 mg/dL — ABNORMAL HIGH (ref 6–20)
CO2: 21 mmol/L — ABNORMAL LOW (ref 22–32)
CO2: 20 mmol/L — ABNORMAL LOW (ref 22–32)
Calcium: 7.7 mg/dL — ABNORMAL LOW (ref 8.9–10.3)
Calcium: 7.7 mg/dL — ABNORMAL LOW (ref 8.9–10.3)
Chloride: 110 mmol/L (ref 98–111)
Chloride: 110 mmol/L (ref 98–111)
Creatinine, Ser: 2.02 mg/dL — ABNORMAL HIGH (ref 0.44–1.00)
Creatinine, Ser: 1.33 mg/dL — ABNORMAL HIGH (ref 0.44–1.00)
GFR calc Af Amer: 31 mL/min — ABNORMAL LOW (ref >60)
GFR calc Af Amer: 52 mL/min — ABNORMAL LOW (ref >60)
GFR calc non Af Amer: 27 mL/min — ABNORMAL LOW (ref >60)
GFR calc non Af Amer: 45 mL/min — ABNORMAL LOW (ref >60)
Glucose, Bld: 95 mg/dL (ref 70–99)
Glucose, Bld: 132 mg/dL — ABNORMAL HIGH (ref 70–99)
Potassium: 3.7 mmol/L (ref 3.5–5.1)
Potassium: 3.5 mmol/L (ref 3.5–5.1)
Sodium: 141 mmol/L (ref 135–145)
Sodium: 141 mmol/L (ref 135–145)

## 2019-06-03 LAB — CBC
HCT: 25.5 % — ABNORMAL LOW (ref 36.0–46.0)
HCT: 25.9 % — ABNORMAL LOW (ref 36.0–46.0)
Hemoglobin: 8.1 g/dL — ABNORMAL LOW (ref 12.0–15.0)
Hemoglobin: 8.3 g/dL — ABNORMAL LOW (ref 12.0–15.0)
MCH: 33.6 pg (ref 26.0–34.0)
MCH: 33.5 pg (ref 26.0–34.0)
MCHC: 31.8 g/dL (ref 30.0–36.0)
MCHC: 32 g/dL (ref 30.0–36.0)
MCV: 105.8 fL — ABNORMAL HIGH (ref 80.0–100.0)
MCV: 104.4 fL — ABNORMAL HIGH (ref 80.0–100.0)
Platelets: 259 10*3/uL (ref 150–400)
Platelets: 263 10*3/uL (ref 150–400)
RBC: 2.41 MIL/uL — ABNORMAL LOW (ref 3.87–5.11)
RBC: 2.48 MIL/uL — ABNORMAL LOW (ref 3.87–5.11)
RDW: 13.2 % (ref 11.5–15.5)
RDW: 13.2 % (ref 11.5–15.5)
WBC: 6.3 10*3/uL (ref 4.0–10.5)
WBC: 6.6 10*3/uL (ref 4.0–10.5)
nRBC: 0 % (ref 0.0–0.2)
nRBC: 0 % (ref 0.0–0.2)

## 2019-06-03 LAB — IRON AND TIBC
Iron: 63 ug/dL (ref 28–170)
Saturation Ratios: 39 % — ABNORMAL HIGH (ref 10.4–31.8)
TIBC: 162 ug/dL — ABNORMAL LOW (ref 250–450)
UIBC: 99 ug/dL

## 2019-06-03 LAB — FERRITIN: Ferritin: 219 ng/mL (ref 11–307)

## 2019-06-03 MED ORDER — CHLORHEXIDINE GLUCONATE CLOTH 2 % EX PADS
6.0000 | MEDICATED_PAD | Freq: Every day | CUTANEOUS | Status: DC
  Administered 2019-06-03: 6 via TOPICAL

## 2019-06-03 NOTE — Progress Notes (Signed)
Subjective:   Catherine Butler was examined at bedside this morning.  Patient appears much more comfortable today than she did yesterday.  States that she feels better too.  She still has some pointed chest pain, however this is improved from the day prior. The patient asks when she can go home.  I explained that we are monitoring her kidney function to make sure it is safe to go home prior to discharge. Patient is in agreement. No other complaints at this time.   Objective: CBC Latest Ref Rng & Units 06/02/2019 06/02/2019 06/02/2019  WBC 4.0 - 10.5 K/uL 7.0 8.6 -  Hemoglobin 12.0 - 15.0 g/dL 9.4(L) 9.5(L) 11.2(L)  Hematocrit 36.0 - 46.0 % 28.7(L) 28.8(L) 33.0(L)  Platelets 150 - 400 K/uL 302 306 -   BMP Latest Ref Rng & Units 06/02/2019 06/02/2019 06/02/2019  Glucose 70 - 99 mg/dL 119(H) 103(H) -  BUN 6 - 20 mg/dL 51(H) 73(H) -  Creatinine 0.44 - 1.00 mg/dL 3.81(H) 7.06(H) -  BUN/Creat Ratio 9 - 23 - - -  Sodium 135 - 145 mmol/L 136 132(L) 127(L)  Potassium 3.5 - 5.1 mmol/L 3.4(L) 3.5 4.0  Chloride 98 - 111 mmol/L 106 97(L) -  CO2 22 - 32 mmol/L 18(L) 18(L) -  Calcium 8.9 - 10.3 mg/dL 8.3(L) 8.4(L) -   Renal ultrasound: IMPRESSION: 1. Bilateral hydronephrosis. Moderate on the RIGHT and mild on the LEFT. No obstructing lesion identified. 2. Bladder collapsed around Foley catheter.  RUQ ultrasound: IMPRESSION: Fatty infiltration of the liver. No other focal abnormality is noted.  Vital signs in last 24 hours: Vitals:   06/02/19 1013 06/02/19 1650 06/02/19 2225 06/03/19 0138  BP: 121/87 (!) 126/91 128/83   Pulse:  (!) 111 (!) 117 (!) 118  Resp: 18 18 18 18   Temp: 98.3 F (36.8 C) 97.8 F (36.6 C) 98.1 F (36.7 C)   TempSrc: Oral  Oral   SpO2: 97% 92% 94% 94%  Weight:      Height:       Physical Exam General: Resting in bed comfortably HEENT: NCAT, conjunctival palor CV: Tachycardia, S1-S2 no murmurs rubs or gallops appreciated PULM: Clear to auscultation bilaterally, no  crackles or wheezes ABD: Mild suprapubic tenderness improved to prior day, all other quadrants soft nontender Skin: Pale appearing Urinary: Urine light yellow color, no blood NEURO: Alert and oriented, nonfocal  Assessment/Plan:  Principal Problem:   Acute renal failure (ARF) (HCC) Active Problems:   Bilateral leg weakness - chronic, after Guillian Barre episode   Acute urinary retention   High anion gap metabolic acidosis   Hyponatremia  Summary, Catherine Butler is a 58 year old lady with a past medical history of atypical GBS with residual paresthesias and weakness who presented with a 1 week history of lightheadedness with position changes, recurrent falls, urinary retention and AKI.   #Hematuria #AKI secondary to urinary retention: Cr. 8.89 on admission and has started to downtrend. (BL normal kidney function). Renal ultrasound significant for bilateral hydronephrosis, suggesting obstruction but no obstructing lesion identitied. Patient reportedly had episode of urinary retention in 2018 and had an extensive neurologic work-up that was unremarkable. Neurology has seen the patient and feels her symptoms are most consistent with atypical GBS exacerbation, specially given the fact that the patient's reflexes are intact. Once the Foley was placed in the ED, the patient immediately diuresed 1.5 L. Has diuresed 5.8 L total.  We will continue to monitor. -Likely has bladder stretch injury, continue foley -Strict I/O -Follow up  with urology -BMP 5AM & 5PM  #New onset chest pain: Improved. Patient had acute pointed chest pain yesterday. Although I could not appreciate a friction rub on exam, I suspect she may have pericarditis secondary to uremia. No ST changes in EKG and flat troponin.  -Continue to monitor  #Orthostatic hypotension -Orthostatic vitals ordered -PT/OT consults ordered  #Anemia: Skin appears pale and has pale conjunctiva on exam.Hgb was 11.2 > 9.5 > 9.4 >8.1. -repeat CBC  ordered -FOBT, Ferritin, Iron, TIBC, reticulocyte ordered -ferrous sulfate tablet 325 mg daily   #GERD -Protonix 40 mg daily  -Zofran 4mg  q8hrs PRN  #Depression -Seroquel 400 mg daily -Cymbalta 30 mg daily  #[place]orn[place] Constipation  -Linaclotide 145 mcg daily   #FEN/GI -Diet: regular + Ensure supplement between meals -Fluids: LR 75 cc/hr  #DVT prophylaxis -Heparin 5000 units subq injections every 8 hours  #Dispo: Anticipated discharge pending medical course.  , MD Internal Medicine, PGY1 Pager: [phone]  06/03/2019,7:35 AM

## 2019-06-03 NOTE — Evaluation (Signed)
Physical Therapy Evaluation Patient Details Name: Catherine Butler MRN: [id number] DOB: 1961/05/26 Today's Date: 06/03/2019   History of Present Illness  This 58 y.o. female admitted 06/01/19 with 1 week h/o lightheadedness with standing and frequent falls which has been a recurrent issue. + chest pain with suspected pericarditis; no EKG changes, flat troponins. Dx:  AKI due to acute urinary retention, orthostatic hypotension.  PMH includes:  GBS with residual parasthesias and weakness, depression, COPD,   Clinical Impression   Pt admitted with above diagnoses and noted to have orthostasis with tachycardia (although pt asymptomatic) during evaluation. She reported 2 falls in the past 3 days with no warning and "blacks out," then waking up on the floor. Patient is currently a high fall risk due to orthostasis, LE weakness, and decreased sensation bil LEs. She is typically home alone when her son is at work, but reports she has friends/family that can come stay with her "for a few days" if needed. She reports she has a wheelchair and that her home is accessible, if she is not safe to ambulate. (She desperately wants to go home). Pt currently with functional limitations due to the deficits listed below (see PT Problem List). Pt will benefit from skilled PT to increase their independence and safety with mobility to allow discharge to the venue listed below.   PT will plan to see 06/04/19 to further assess safety with mobility and assist with discharge planning.       Follow Up Recommendations Home health PT;Supervision/Assistance - 24 hour(24/7 due to recent falls w/ reports of blacking out)    Equipment Recommendations  None recommended by PT    Recommendations for Other Services       Precautions / Restrictions Precautions Precautions: Fall Restrictions Weight Bearing Restrictions: No      Mobility  Bed Mobility Overal bed mobility: Modified Independent             General bed  mobility comments: using rail and HOB ~20  Transfers Overall transfer level: Needs assistance Equipment used: Rolling walker (2 wheeled) Transfers: Sit to/from [employer] Sit to Stand: Min guard Stand pivot transfers: Min assist       General transfer comment: vc for safety with RW; hands-on assist due to recent falls "blacks out"  Ambulation/Gait             General Gait Details: deferred due to orthostasis and pt reports no warning with recent falls (states she blacks out and goes down)  Information systems manager [place] (Stroke Patients Only)       Balance Overall balance assessment: History of Falls                                           Pertinent Vitals/Pain Pain Assessment: 0-10 Pain Score: 2  Pain Location: bil lower legs, feet Pain Descriptors / Indicators: Pins and needles Pain Intervention(s): Monitored during session(pt reports due to neuropathy)    Home Living Family/patient expects to be discharged to:: Private residence Living Arrangements: Children(son) Available Help at Discharge: Family;Friend(s);Available PRN/intermittently Type of Home: House Home Access: Ramped entrance Entrance Stairs-Rails: None   Home Layout: Two level;Able to live on main level with bedroom/bathroom Home Equipment: Shower seat;Wheelchair - manual;Cane - single point;Walker - 2 wheels  Prior Function Level of Independence: Needs assistance   Gait / Transfers Assistance Needed: did not use device to walk; uses ramp  ADL's / Homemaking Assistance Needed: son assists with housework; independent with self-care        Hand Dominance   Dominant Hand: Right    Extremity/Trunk Assessment   Upper Extremity Assessment Upper Extremity Assessment: Defer to OT evaluation    Lower Extremity Assessment Lower Extremity Assessment: Generalized weakness(RLE slightly stronger (4+ compared to 4))     Cervical / Trunk Assessment Cervical / Trunk Assessment: Normal  Communication   Communication: No difficulties  Cognition Arousal/Alertness: Awake/alert Behavior During Therapy: Impulsive(slightly; moves before therapist set) Overall Cognitive Status: No family/caregiver present to determine baseline cognitive functioning Area of Impairment: Orientation;Awareness;Attention                 Orientation Level: Place([place], could not name MC with cues) Current Attention Level: Sustained(easily distracted; changes topics quickly in conversation)       Awareness: Intellectual   General Comments: poor awareness of deficits and risks of mobility/returning home (wants to go home today)      General Comments      Exercises     Assessment/Plan    PT Assessment Patient needs continued PT services  PT Problem List Decreased strength;Decreased activity tolerance;Decreased mobility;Decreased cognition;Decreased knowledge of use of DME;Decreased safety awareness;Decreased knowledge of precautions;Cardiopulmonary status limiting activity;Impaired sensation;Pain       PT Treatment Interventions DME instruction;Gait training;Functional mobility training;Therapeutic activities;Balance training;Cognitive remediation;Patient/family education;Wheelchair mobility training    PT Goals (Current goals can be found in the Care Plan section)  Acute Rehab PT Goals Patient Stated Goal: to go home today  PT Goal Formulation: With patient Time For Goal Achievement: 06/17/19 Potential to Achieve Goals: Fair(pending resolution of medical issues/orthostasis)    Frequency Min 3X/week   Barriers to discharge Decreased caregiver support per patient has intermittent assist when son at work    Co-evaluation               AM-PAC PT "6 Clicks" Mobility  Outcome Measure Help needed turning from your back to your side while in a flat bed without using bedrails?: None Help needed moving  from lying on your back to sitting on the side of a flat bed without using bedrails?: A Little Help needed moving to and from a bed to a chair (including a wheelchair)?: A Little Help needed standing up from a chair using your arms (e.g., wheelchair or bedside chair)?: A Little Help needed to walk in hospital room?: Total Help needed climbing 3-5 steps with a railing? : Total 6 Click Score: 15    End of Session Equipment Utilized During Treatment: Gait belt Activity Tolerance: Treatment limited secondary to medical complications (Comment) Patient left: in chair;with call bell/phone within reach;with chair alarm set Nurse Communication: Mobility status;Other (comment)(orthostasis, tachycardia) PT Visit Diagnosis: Repeated falls (R29.6);Muscle weakness (generalized) (M62.81);Difficulty in walking, not elsewhere classified (R26.2)    Time: 8101-7510 PT Time Calculation (min) (ACUTE ONLY): 37 min   Charges:   PT Evaluation $PT Eval Moderate Complexity: 1 Mod           Barry Brunner, PT Pager [phone]   Rexanne Mano 06/03/2019, 11:04 AM

## 2019-06-03 NOTE — Evaluation (Signed)
Clinical/Bedside Swallow Evaluation Patient Details  Name: DELAILA NAND MRN: [id number] Date of Birth: [birth year]  Today's Date: 06/03/2019 Time: SLP Start Time (ACUTE ONLY): 1404 SLP Stop Time (ACUTE ONLY): 1416 SLP Time Calculation (min) (ACUTE ONLY): 12 min  Past Medical History:  Past Medical History:  Diagnosis Date  . Anxiety   . Arthritis   . Asthma   . Complication of anesthesia    nausea  . COPD (chronic obstructive pulmonary disease) (HCC)   . Depression   . Guillain-Barre (HCC) 2018  . H/O alcohol abuse   . Hypertension    pt denies  . PONV (postoperative nausea and vomiting)   . Reflux   . Suicide attempt by multiple drug overdose ([facility]) 2014   Past Surgical History:  Past Surgical History:  Procedure Laterality Date  . COLONOSCOPY WITH PROPOFOL N/A 07/21/2017   Procedure: COLONOSCOPY WITH PROPOFOL;  Surgeon: Rachael Fee, MD;  Location: [facility];  Service: Endoscopy;  Laterality: N/A;  . LAPAROSCOPY    . laporscopy surgery for ovarian cyst  20 years ago  . TONSILLECTOMY    . TUBAL LIGATION     HPI:  This 58 y.o. female admitted 06/01/19 with 1 week h/o lightheadedness with standing and frequent falls which has been a recurrent issue. + chest pain with suspected pericarditis; no EKG changes, flat troponins. Dx:  AKI due to acute urinary retention, orthostatic hypotension.  PMH includes:  GBS with residual parasthesias and weakness, depression, COPD,    Assessment / Plan / Recommendation Clinical Impression  Patient presents with a suspected primary esophageal dysphagia with c/o severe chest pain with onset of po intake, occurring during today's evaluation as well. Otherwise, patient with a normal appearing oropharyngeal swallow without overt indication of aspiration. No SLP f/u indicated however would recommend GI consult to evaluate esophageal function. Patient with know large hiatal hernia.  SLP Visit Diagnosis: Dysphagia, unspecified (R13.10)     Aspiration Risk  Mild aspiration risk    Diet Recommendation Regular;Thin liquid(as patient can tolerate)   Liquid Administration via: Cup;Straw Medication Administration: Crushed with puree Supervision: Patient able to self feed Compensations: Slow rate;Small sips/bites;Follow solids with liquid Postural Changes: Seated upright at 90 degrees;Remain upright for at least 30 minutes after po intake    Other  Recommendations Recommended Consults: Consider ENT evaluation Oral Care Recommendations: Oral care BID   Follow up Recommendations None      Frequency and Duration            Prognosis        Swallow Study   General HPI: This 58 y.o. female admitted 06/01/19 with 1 week h/o lightheadedness with standing and frequent falls which has been a recurrent issue. + chest pain with suspected pericarditis; no EKG changes, flat troponins. Dx:  AKI due to acute urinary retention, orthostatic hypotension.  PMH includes:  GBS with residual parasthesias and weakness, depression, COPD,  Type of Study: Bedside Swallow Evaluation Previous Swallow Assessment: evaluated by SLP 07/19/17, found to have a normal oropharyngeal swallow. noted large hiatal hernia.  Diet Prior to this Study: Regular;Thin liquids Temperature Spikes Noted: No Respiratory Status: Room air History of Recent Intubation: No Behavior/Cognition: Alert;Cooperative;Pleasant mood Oral Cavity Assessment: Within Functional Limits Oral Care Completed by SLP: No Vision: Functional for self-feeding Self-Feeding Abilities: Able to feed self Patient Positioning: Upright in bed Baseline Vocal Quality: Normal Volitional Cough: Strong Volitional Swallow: Able to elicit    Oral/Motor/Sensory Function Overall Oral Motor/Sensory Function: Within functional limits  Ice Chips Ice chips: Not tested   Thin Liquid Thin Liquid: Within functional limits Presentation: Cup;Straw    Nectar Thick Nectar Thick Liquid: Not tested   Honey  Thick Honey Thick Liquid: Not tested   Puree Puree: Within functional limits Presentation: Spoon   Solid    Riot Barrick MA, CCC-SLP   Solid: Within functional limits Presentation: [place] 06/03/2019,3:13 PM

## 2019-06-03 NOTE — Evaluation (Addendum)
Occupational Therapy Evaluation Patient Details Name: Catherine Butler MRN: [id number] DOB: [birth year] Today's Date: 06/03/2019    History of Present Illness This 58 y.o. female admitted 06/01/19 with 1 week h/o lightheadedness with standing and frequent falls which has been a recurrent issue. + chest pain with suspected pericarditis; no EKG changes, flat troponins. Dx:  AKI due to acute urinary retention, orthostatic hypotension.  PMH includes:  GBS with residual parasthesias and weakness, depression, COPD,    Clinical Impression   PTA pt living with son, able to complete BADL independently but needing assist with IADL. She has had recent falls (2 in the last 3 days) due to what she describes as "black outs". Pt is able to complete bed mobility at mod I level, and transfers at min guard- min A level. Pt presents positive for orthostasis (see below). This is impacting her ability to safely navigate her home environment and increasing risk for falls. Pt also present with decreased awareness to deficits, which also is a fall risk. Also noted pt has chest pain after she swallows PO. Informed MD for SLP consult to assess safe swallowing. Given current status, recommend HHOT with 24/7 supervision at d/c. Will continue to follow per POC listed below.  BP as follows: seated- 129/94; standing 105/88; seated after ~2 minutes 116/90      Follow Up Recommendations  Home health OT;Supervision/Assistance - 24 hour    Equipment Recommendations  3 in 1 bedside commode    Recommendations for Other Services       Precautions / Restrictions Precautions Precautions: Fall Restrictions Weight Bearing Restrictions: No      Mobility Bed Mobility Overal bed mobility: Modified Independent             General bed mobility comments: using rail and HOB ~20  Transfers Overall transfer level: Needs assistance Equipment used: Rolling walker (2 wheeled) Transfers: Sit to/from [employer] Sit to Stand: Min guard Stand pivot transfers: Min assist       General transfer comment: vc for safety with RW; hands-on assist due to recent falls "blacks out"    Balance Overall balance assessment: History of Falls                                         ADL either performed or assessed with clinical judgement   ADL Overall ADL's : Needs assistance/impaired Eating/Feeding: Set up;Sitting   Grooming: Set up;Sitting   Upper Body Bathing: Minimal assistance;Sitting   Lower Body Bathing: Minimal assistance;Sit to/from stand;Sitting/lateral leans   Upper Body Dressing : Minimal assistance;Sitting   Lower Body Dressing: Minimal assistance;Sit to/from stand;Sitting/lateral leans   Toilet Transfer: Minimal assistance;Ambulation;RW;Stand-pivot   Toileting- Clothing Manipulation and Hygiene: Minimal assistance;Sit to/from stand;Sitting/lateral lean   Tub/ Shower Transfer: Minimal assistance;Stand-pivot;Shower seat;Rolling walker   Functional mobility during ADLs: Minimal assistance;Cueing for safety;Cueing for sequencing;Rolling walker General ADL Comments: pt limited by poor activity tolerance, orthostasis causing black outs and increasing risk of falls, and cognitive deficits     Vision Baseline Vision/History: No visual deficits Patient Visual Report: No change from baseline       Perception     Praxis      Pertinent Vitals/Pain Pain Assessment: 0-10 Pain Score: 2  Pain Location: bil lower legs, feet Pain Descriptors / Indicators: Pins and needles Pain Intervention(s): Monitored during session     Hand Dominance Right  Extremity/Trunk Assessment Upper Extremity Assessment Upper Extremity Assessment: Generalized weakness   Lower Extremity Assessment Lower Extremity Assessment: Defer to PT evaluation       Communication Communication Communication: No difficulties   Cognition Arousal/Alertness: Awake/alert Behavior During  Therapy: Impulsive Overall Cognitive Status: No family/caregiver present to determine baseline cognitive functioning Area of Impairment: Orientation;Awareness;Attention;Safety/judgement                 Orientation Level: Place Current Attention Level: Sustained     Safety/Judgement: Decreased awareness of safety;Decreased awareness of deficits Awareness: Intellectual   General Comments: poor awarenss of safety, deficits and risks of returning home with appropriate assistance   General Comments       Exercises     Shoulder Instructions      Home Living Family/patient expects to be discharged to:: Private residence Living Arrangements: Children(son) Available Help at Discharge: Family;Friend(s);Available PRN/intermittently Type of Home: House Home Access: Ramped entrance   Entrance Stairs-Rails: None Home Layout: Two level;Able to live on main level with bedroom/bathroom Alternate Level Stairs-Number of Steps: 1 down into den that she does not use Alternate Level Stairs-Rails: None Bathroom Shower/Tub: Chief Strategy OfficerTub/shower unit   Bathroom Toilet: Standard Bathroom Accessibility: Yes   Home Equipment: Shower seat;Wheelchair - manual;Cane - single point;Walker - 2 wheels          Prior Functioning/Environment Level of Independence: Needs assistance  Gait / Transfers Assistance Needed: did not use device to walk; uses ramp ADL's / Homemaking Assistance Needed: son assists with housework; independent with self-care Communication / Swallowing Assistance Needed: grimaces with pain with every swallow and reports chest pain "stabbing"          OT Problem List: Decreased strength;Decreased knowledge of use of DME or AE;Decreased activity tolerance;Decreased cognition;Impaired balance (sitting and/or standing);Decreased safety awareness      OT Treatment/Interventions: Self-care/ADL training;Therapeutic exercise;Patient/family education;Balance training;Therapeutic  activities;Energy conservation;DME and/or AE instruction;Cognitive remediation/compensation    OT Goals(Current goals can be found in the care plan section) Acute Rehab OT Goals Patient Stated Goal: to go home today  OT Goal Formulation: With patient Time For Goal Achievement: 06/17/19 Potential to Achieve Goals: Good  OT Frequency: Min 2X/week   Barriers to D/C:            Co-evaluation              AM-PAC OT "6 Clicks" Daily Activity     Outcome Measure Help from another person eating meals?: A Little Help from another person taking care of personal grooming?: A Little Help from another person toileting, which includes using toliet, bedpan, or urinal?: A Little Help from another person bathing (including washing, rinsing, drying)?: A Lot Help from another person to put on and taking off regular upper body clothing?: A Little Help from another person to put on and taking off regular lower body clothing?: A Little 6 Click Score: 17   End of Session Equipment Utilized During Treatment: Gait belt;Rolling walker Nurse Communication: Mobility status  Activity Tolerance: Patient tolerated treatment well Patient left: in chair;with call bell/phone within reach;with chair alarm set  OT Visit Diagnosis: Unsteadiness on feet (R26.81);Other abnormalities of gait and mobility (R26.89);Muscle weakness (generalized) (M62.81);History of falling (Z91.81);Pain Pain - Right/Left: (Bil) Pain - part of body: Ankle and joints of foot                Time: [id number] OT Time Calculation (min): 34 min Charges:  OT General Charges $OT Visit: 1 Visit OT Evaluation $OT Eval  Moderate Complexity: 1 Mod  Zenovia Jarred, MSOT, OTR/L Behavioral Health OT/ Acute Relief OT [facility] Office: [place] 06/03/2019, 1:50 PM

## 2019-06-04 DIAGNOSIS — R1310 Dysphagia, unspecified: Secondary | ICD-10-CM

## 2019-06-04 LAB — BASIC METABOLIC PANEL
Anion gap: 12 (ref 5–15)
BUN: 12 mg/dL (ref 6–20)
CO2: 21 mmol/L — ABNORMAL LOW (ref 22–32)
Calcium: 7.3 mg/dL — ABNORMAL LOW (ref 8.9–10.3)
Chloride: 107 mmol/L (ref 98–111)
Creatinine, Ser: 0.95 mg/dL (ref 0.44–1.00)
GFR calc Af Amer: >60 mL/min (ref >60)
GFR calc non Af Amer: >60 mL/min (ref >60)
Glucose, Bld: 162 mg/dL — ABNORMAL HIGH (ref 70–99)
Potassium: 3.3 mmol/L — ABNORMAL LOW (ref 3.5–5.1)
Sodium: 140 mmol/L (ref 135–145)

## 2019-06-04 MED ORDER — POTASSIUM CHLORIDE CRYS ER 20 MEQ PO TBCR
20.0000 meq | EXTENDED_RELEASE_TABLET | ORAL | Status: AC
  Administered 2019-06-04 (×2): 20 meq via ORAL
  Filled 2019-06-04 (×2): qty 1

## 2019-06-04 NOTE — Progress Notes (Addendum)
Subjective:   Catherine Butler was examined during rounds. States she had poor night of sleep but otherwise feels well. Mentions that she had little light-headedness during PT/OT yesterday She mentions that she has had recurrent episode of GBS flairs, which usual presents as hand tremors She also provides additional recent history of weight gain For her urinary retention, mentions that she 'always had problems going to the bathroom' but for the last couple months her stomach had progressively become distended with inability to urinate. Discussed plan for voiding trail this am and plan to discharge with urology follow up.  Patient also discusses weeks to months history of dysphagia with odynophagia which has limited her PO intake. She has never had an EGD or seen a GI doctor for this problem.Denies hematemesis.   Objective:  Vital signs in last 24 hours: Vitals:   06/03/19 1527 06/03/19 2144 06/04/19 0753 06/04/19 0900  BP: (!) 123/95 (!) 136/94 126/84 107/81  Pulse: 73 (!) 105 (!) 120 (!) 116  Resp:  18    Temp: 98.2 F (36.8 C) 98.9 F (37.2 C) 99.7 F (37.6 C) 99.1 F (37.3 C)  TempSrc: Oral Oral Oral Oral  SpO2: 99% 100% 96%   Weight:      Height:        Physical Exam: Physical Exam  Constitutional: She is oriented to person, place, and time. No distress.  HENT:  Head: Normocephalic and atraumatic.  Eyes: Pupils are equal, round, and reactive to light. EOM are normal.  Cardiovascular: Normal rate, regular rhythm and intact distal pulses. Exam reveals no gallop and no friction rub.  No murmur heard. Pulmonary/Chest: Effort normal. No respiratory distress. She exhibits no tenderness.  Abdominal: Soft. She exhibits no distension.  1) Abdominal pain - patient admits dysphagia with odynophagia with a chronic GERD/epigastric abdominal pain.    Musculoskeletal:        General: No tenderness or edema. Normal range of motion.     Cervical back: Normal range of motion.  Neurological:  She is alert and oriented to person, place, and time.  Skin: Skin is warm and dry. She is not diaphoretic.     Pertinent labs/Imaging: CBC Latest Ref Rng & Units 06/03/2019 06/03/2019 06/02/2019  WBC 4.0 - 10.5 K/uL 6.6 6.3 7.0  Hemoglobin 12.0 - 15.0 g/dL 8.3(L) 8.1(L) 9.4(L)  Hematocrit 36.0 - 46.0 % 25.9(L) 25.5(L) 28.7(L)  Platelets 150 - 400 K/uL 263 259 302    CMP Latest Ref Rng & Units 06/04/2019 06/03/2019 06/03/2019  Glucose 70 - 99 mg/dL 162(H) 132(H) 95  BUN 6 - 20 mg/dL 12 25(H) 30(H)  Creatinine 0.44 - 1.00 mg/dL 0.95 1.33(H) 2.02(H)  Sodium 135 - 145 mmol/L 140 141 141  Potassium 3.5 - 5.1 mmol/L 3.3(L) 3.5 3.7  Chloride 98 - 111 mmol/L 107 110 110  CO2 22 - 32 mmol/L 21(L) 20(L) 21(L)  Calcium 8.9 - 10.3 mg/dL 7.3(L) 7.7(L) 7.7(L)  Total Protein 6.5 - 8.1 g/dL - - -  Total Bilirubin 0.3 - 1.2 mg/dL - - -  Alkaline Phos 38 - 126 U/L - - -  AST 15 - 41 U/L - - -  ALT 0 - 44 U/L - - -   Assessment/Plan:  Principal Problem:   Acute renal failure (ARF) (HCC) Active Problems:   Bilateral leg weakness - chronic, 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  female with a pertinent PMH of GBS with residual paresthesias/weakness who presented with positional lightheadedness resulting in several falls, urine retention, and AKI.    #AKI 2/2 to urinary retention.  - Renal US shows bilateral hydronephrosis with almost 3 L of fluid removed with foley placement - Urology appointment was set up for patient prior to discharge.  - She will be discharged with a foley in place.  #Orthostatic hypotension: - Orthostatic hypotension improved with hydration. - denies lightheadedness with PT. - She states that she is only symptomatic when having flares of GBS  #Anemia 2/2 to chronic disease/inflammation in the setting of her GBS - Ferritin 219, Iron 63, TIBC 162, Sat  39  #Dysphagia/Odynophagia: #GERD: - Patient will need close follow up with PCP and GI for EGD.  Diet: Renal IVF: LR 75 VTE: Heparin Code: Full  Dispo: Anticipated discharge today with close follow up [facility] and Urology.   Dellia Cloud, MD 06/04/2019, 11:54 AM Pager: [phone]

## 2019-06-04 NOTE — TOC Transition Note (Signed)
Transition of Care [facility]) - CM/SW Discharge Note   Patient Details  Name: Catherine Butler MRN: [id number] Date of Birth: [birth year]  [facility] of [facility]) CM/SW Contact:  Sharin Mons, RN Phone Number: 06/04/2019, 4:34 PM   Clinical Narrative:    Admitted 06/01/19 with 1 week h/o lightheadedness with standing and frequent falls. Pt will transition to home with son today with home health services, [facility] 06/06/2019.  DME, rolling walker will be delivered to bedside prior to d/c.   Theda Belfast [facility])       [phone]      Son to provide transportation to home.  Final next level of care: [place] Barriers to Discharge: No Barriers Identified   Patient Goals and CMS Choice     Choice offered to / list presented to : Patient  Discharge Placement     Discharge Plan and Services                DME Arranged: Walker rolling DME Agency: [employer] Date DME Agency Contacted: 06/04/19 Time DME Agency Contacted: [phone] Representative spoke with at DME Agency: [facility] HH Arranged: PT, OT [place] Agency: [facility] [employer] Date [place]: 06/04/19 Time [place]: [phone] Representative spoke with at [place]: [place] ([place]) Interventions     Readmission Risk Interventions No flowsheet data found.

## 2019-06-04 NOTE — Progress Notes (Addendum)
Physical Therapy Treatment Patient Details Name: Catherine Butler MRN: [id number] DOB: 1961/04/18 Today's Date: 06/04/2019    History of Present Illness This 58 y.o. female admitted 06/01/19 with 1 week h/o lightheadedness with standing and frequent falls which has been a recurrent issue. + chest pain with suspected pericarditis; no EKG changes, flat troponins. Dx:  AKI due to acute urinary retention, orthostatic hypotension.  PMH includes:  GBS with residual parasthesias and weakness, depression, COPD,     PT Comments    Pt was seen for mobility on RW, with good response to walking on the halls.  Her distance with min guard was much improved from the first PT visit, demonstrating better endurance, better control of balance and safer technique for use of walker.  Pt will continue with home therapy to increase all these values, and to reduce her fall risk being home with family.   Follow Up Recommendations  Home health PT;Supervision/Assistance - 24 hour     Equipment Recommendations  None recommended by PT    Recommendations for Other Services       Precautions / Restrictions Precautions Precautions: Fall Precaution Comments: monitor vitals wiht mobility Restrictions Weight Bearing Restrictions: No    Mobility  Bed Mobility Overal bed mobility: Modified Independent             General bed mobility comments: rails used  Transfers Overall transfer level: Needs assistance Equipment used: Rolling walker (2 wheeled);1 person hand held assist Transfers: Sit to/from Stand Sit to Stand: Min guard         General transfer comment: has blacked out and fallen in the past  Ambulation/Gait Ambulation/Gait assistance: Min guard Gait Distance (Feet): 120 Feet Assistive device: Rolling walker (2 wheeled) Gait Pattern/deviations: Step-through pattern;Wide base of support Gait velocity: reduced Gait velocity interpretation: <1.31 ft/sec, indicative of household ambulator General  Gait Details: pt has been up to BR today, moving better   Stairs             Wheelchair Mobility    Modified Rankin (Stroke Patients Only)       Balance Overall balance assessment: History of Falls                                          Cognition Arousal/Alertness: Awake/alert Behavior During Therapy: Impulsive Overall Cognitive Status: No family/caregiver present to determine baseline cognitive functioning Area of Impairment: Problem solving;Awareness;Safety/judgement;Following commands                   Current Attention Level: Selective   Following Commands: Follows one step commands inconsistently;Follows one step commands with increased time Safety/Judgement: Decreased awareness of deficits Awareness: Intellectual Problem Solving: Slow processing;Requires verbal cues General Comments: pt is not concerned with getting enough help at home      Exercises General Exercises - Lower Extremity Long Arc Quad: Strengthening;10 reps Heel Slides: Strengthening;10 reps    General Comments General comments (skin integrity, edema, etc.): pt is up to walk with gait belt on RW, note her control of balance is reduced by dizziness typically but no such episodes today.      Pertinent Vitals/Pain Pain Assessment: No/denies pain    Home Living                      Prior Function  PT Goals (current goals can now be found in the care plan section) Acute Rehab PT Goals Patient Stated Goal: to go home today  Progress towards PT goals: Progressing toward goals    Frequency    Min 3X/week      PT Plan Current plan remains appropriate    Co-evaluation              AM-PAC PT "6 Clicks" Mobility   Outcome Measure  Help needed turning from your back to your side while in a flat bed without using bedrails?: None Help needed moving from lying on your back to sitting on the side of a flat bed without using bedrails?:  None Help needed moving to and from a bed to a chair (including a wheelchair)?: None Help needed standing up from a chair using your arms (e.g., wheelchair or bedside chair)?: A Little Help needed to walk in hospital room?: A Little Help needed climbing 3-5 steps with a railing? : A Little 6 Click Score: 21    End of Session Equipment Utilized During Treatment: Gait belt Activity Tolerance: Patient limited by fatigue;Treatment limited secondary to medical complications (Comment) Patient left: in bed;with call bell/phone within reach Nurse Communication: Mobility status;Other (comment)(tolerated gait with no LOB) PT Visit Diagnosis: Repeated falls (R29.6);Muscle weakness (generalized) (M62.81);Difficulty in walking, not elsewhere classified (R26.2)     Time: 8676-1950 PT Time Calculation (min) (ACUTE ONLY): 17 min  Charges:  $Gait Training: 8-22 mins                    Ivar Drape 06/04/2019, 5:03 PM   Samul Dada, PT MS Acute Rehab Dept. Number: [facility] [id number] and [facility] [phone]

## 2019-06-04 NOTE — Progress Notes (Signed)
Foley was placed prior to discharging patient

## 2019-06-04 NOTE — Discharge Summary (Addendum)
Name: BRINLY MAIETTA MRN: [id number] DOB: October 11, 1960 58 y.o. PCP: Kerin Perna, NP  Date of Admission: 06/01/2019  8:21 PM Date of Discharge:  Attending Physician: Bartholomew Crews, MD  Discharge Diagnosis: 1. Urinary retention  Discharge Medications: Allergies as of 06/04/2019      Reactions   Influenza Vaccines Other (See Comments)   Patient got Guillain Barre   Lipitor [atorvastatin] Other (See Comments)   "really bad leg pain"      Medication List    TAKE these medications   acetaminophen 500 MG tablet Commonly known as: TYLENOL Take 1,000 mg by mouth 2 (two) times daily as needed for headache.   albuterol 108 (90 Base) MCG/ACT inhaler Commonly known as: VENTOLIN HFA Inhale 2 puffs into the lungs every 6 (six) hours as needed for wheezing.   albuterol (2.5 MG/3ML) 0.083% nebulizer solution Commonly known as: PROVENTIL Take 3 mLs (2.5 mg total) by nebulization every 6 (six) hours as needed for wheezing or shortness of breath.   aspirin EC 81 MG tablet Take 1 tablet (81 mg total) by mouth daily.   DULoxetine 30 MG capsule Commonly known as: CYMBALTA Take 1 capsule (30 mg total) by mouth daily.   esomeprazole 40 MG capsule Commonly known as: NEXIUM TAKE 1 CAPSULE BY MOUTH ONCE DAILY BEFORE BREAKFAST What changed: See the new instructions.   gabapentin 600 MG tablet Commonly known as: NEURONTIN Take 2 tablets by mouth three times a day What changed:   how much to take  how to take this  when to take this  additional instructions   ibuprofen 200 MG tablet Commonly known as: ADVIL Take 400 mg by mouth every 6 (six) hours as needed for fever, headache, mild pain, moderate pain or cramping.   Linzess 145 MCG Caps capsule Generic drug: linaclotide TAKE 1 CAPSULE BY MOUTH ONCE DAILY BEFORE BREAKFAST. TAKE ON AN EMPTY STOMACH. What changed: See the new instructions.   mirtazapine 15 MG tablet Commonly known as: REMERON Take 15 mg by mouth at  bedtime.   ondansetron 4 MG disintegrating tablet Commonly known as: ZOFRAN-ODT Take 1 tablet (4 mg total) by mouth every 8 (eight) hours as needed for nausea or vomiting.   QUEtiapine 400 MG tablet Commonly known as: SEROQUEL Take 400 mg by mouth at bedtime.            Durable Medical Equipment  (From admission, onward)         Start     Ordered   06/04/19 1046  For home use only DME Walker rolling  [facility])  Once    Question:  Patient needs a walker to treat with the following condition  Answer:  Sharlyn Bologna syndrome [facility])   06/04/19 1107         Disposition and follow-up:   Ms.Breya D Gatling was discharged from [facility] in Stable condition.  At the hospital follow up visit please address:  1.  Urinary retention: Ensure she has f/u with urology and consider bladder scan at hospital f/u appointment. Please talk to the patient about her dysphagia/odynophagia and get an EGD/GI referral   2.  Labs / imaging needed at time of follow-up: CBC and BMP. She will need an EGD from GI.  3.  Pending labs/ test needing follow-up: none  Follow-up Appointments: Follow-up Information    [facility]. Go on 06/13/2019.   Why: 06/03/2019 at 8:30 am. Please arrive at 8:15 am. Contact information: [address] [place] [id number] [phone]       [facility]. Schedule an appointment as soon as possible for a visit in 1 week(s).   Why: Clinic will call you to make an appoinment.  Contact information: [address] [place] [id number] [phone]       [facility].   Specialty: Home Health Services Why: home health service arranged Contact information: [address] [place] [id number] [phone]           Hospital Course by problem list: Ms. Regino BellowLoman is a 58 y.o. female with a history of depression, COPD, atypical GBS, who presented with  orthostatic hypotension.  1. Post-Obstructive Acute Kidney injury: Patient presented to [facility] on 12/12 with urinary retention that was found to be due to bladder outlet obstruction.  She presented with an acute elevation in creatinine of 8.7 up from 0.61. She had multiple electolyte/metoabolic abdnormalities, including anion gap metabolic acidosis, hyponatremia, uremia (BUN of 33).   Patient had a similar episode of urinary rention in October of 2018. Renal US showed bilateral hydronephrosis and foley catheter was placed with output of roughly 3 L of urine. Cr improved to 0.95, which is close to her baseline with conservative management. Voiding trial was attempted without success. Prior to discharge, the patient as scheduled for an outpatient follow up with Urology and discharge with a foley catheter in place.   2. Orthostatic hypotension: Patient presented with a 1 week history of lightheadedness with standing and admits to increased number of falls. She states that this has been a recurrent issues, particularly when she has "GBS flares" that intermittently occur at least monthly. Patient was recently seen by her PCP and was told to increase her fluid intake and prescribed zofran for nausea. [facility] with hypotension of 90/70's and tachycardia. Patient admits to a poor PO intake over the last several months to years. She states this is due to progressive dysphagia of solids and liquids with odynophagia. Patient was given fluids which resolved her hypotension and improvement of her lightheaded.    3. Dysphagia/Odynophagia - Patient complained of odynophagia and dysphagia. She states that this has been going on for several months and has caused her to loose weight. She was counseled on the importance of following up with GI for an upper endoscopy.  Discharge Vitals:   BP 107/81 (BP Location: Right Arm)    Pulse (!) 116    Temp 99.1 F (37.3 C) (Oral)    Resp 18    Ht 5\' 6"  (1.676 m)     Wt 60.6 kg    LMP 02/22/2006    SpO2 96%    BMI 21.56 kg/m   Pertinent Labs, Studies, and Procedures:  CBC Latest Ref Rng & Units 06/03/2019 06/03/2019 06/02/2019  WBC 4.0 - 10.5 K/uL 6.6 6.3 7.0  Hemoglobin 12.0 - 15.0 g/dL 8.3(L) 8.1(L) 9.4(L)  Hematocrit 36.0 - 46.0 % 25.9(L) 25.5(L) 28.7(L)  Platelets 150 - 400 K/uL 263 259 302   CMP Latest Ref Rng & Units 06/04/2019 06/03/2019 06/03/2019  Glucose 70 - 99 mg/dL 413(K162(H) 440(N132(H) 95  BUN 6 - 20 mg/dL 12 02(V25(H) 25(D30(H)  Creatinine 0.44 - 1.00 mg/dL 6.640.95 4.03(K1.33(H) 7.42(V2.02(H)  Sodium 135 - 145 mmol/L 140 141 141  Potassium 3.5 - 5.1 mmol/L 3.3(L) 3.5 3.7  Chloride 98 - 111 mmol/L 107 110 110  CO2 22 - 32  mmol/L 21(L) 20(L) 21(L)  Calcium 8.9 - 10.3 mg/dL 7.3(L) 7.7(L) 7.7(L)  Total Protein 6.5 - 8.1 g/dL - - -  Total Bilirubin 0.3 - 1.2 mg/dL - - -  Alkaline Phos 38 - 126 U/L - - -  AST 15 - 41 U/L - - -  ALT 0 - 44 U/L - - -     Discharge Instructions: Discharge Instructions    Diet - low sodium heart healthy   Complete by: As directed    Discharge instructions   Complete by: As directed    You were hospitalized for Orthostatic Hypotension. Thank you for allowing [place] to be part of your care.   We arranged for you to follow up at:   1) [facility] - in 1 weeks. The Clinic will call you to make an appointment.    Please note these changes made to your medications:   Please START taking: please continue taking all of your home medications as they were prescribed. See the list of medications on your after visit summary.   Please STOP taking: none   Please make sure to go to your hospital follow up appointment at [facility].   Please call our clinic if you have any questions or concerns, we may be able to help and keep you from a long and expensive emergency room wait. Our clinic and after hours phone number is [phone], the best time to call is Monday through Friday 9 am to 4 pm but  there is always someone available 24/7 if you have an emergency. If you need medication refills please notify your pharmacy one week in advance and they will send [place] a request.   Face-to-face encounter (required for Medicare/Medicaid patients)   Complete by: As directed    I Dellia Cloud certify that this patient is under my care and that I, or a nurse practitioner or physician's assistant working with me, had a face-to-face encounter that meets the physician face-to-face encounter requirements with this patient on 06/04/2019. 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   The encounter with the patient was in whole, or in part, for the following medical condition, which is the primary reason for home health care: GBS with residual weakness and parethesias   I certify that, based on my findings, the following services are medically necessary home health services: Physical therapy   Reason for Medically Necessary Home Health Services: Therapy- Investment banker, operational, Patent examiner   My clinical findings support the need for the above services: Unsafe ambulation due to balance issues   Further, I certify that my clinical findings support that this patient is homebound due to: Unsafe ambulation due to balance issues   Home Health   Complete by: As directed    To provide the following care/treatments: PT   Increase activity slowly   Complete by: As directed       Signed: Dellia Cloud, MD 06/04/2019, 2:21 PM   Pager: [phone]

## 2019-06-07 ENCOUNTER — Other Ambulatory Visit: Payer: Self-pay | Admitting: Gastroenterology

## 2019-06-12 ENCOUNTER — Ambulatory Visit (INDEPENDENT_AMBULATORY_CARE_PROVIDER_SITE_OTHER): Payer: Medicaid Other | Admitting: Internal Medicine

## 2019-06-12 ENCOUNTER — Other Ambulatory Visit: Payer: Self-pay

## 2019-06-12 VITALS — BP 112/88 | HR 116 | Temp 98.0°F | Ht 66.0 in | Wt 123.2 lb

## 2019-06-12 DIAGNOSIS — R1310 Dysphagia, unspecified: Secondary | ICD-10-CM | POA: Insufficient documentation

## 2019-06-12 DIAGNOSIS — R112 Nausea with vomiting, unspecified: Secondary | ICD-10-CM | POA: Diagnosis not present

## 2019-06-12 DIAGNOSIS — T83511A Infection and inflammatory reaction due to indwelling urethral catheter, initial encounter: Secondary | ICD-10-CM

## 2019-06-12 DIAGNOSIS — R339 Retention of urine, unspecified: Secondary | ICD-10-CM | POA: Diagnosis not present

## 2019-06-12 DIAGNOSIS — E876 Hypokalemia: Secondary | ICD-10-CM

## 2019-06-12 DIAGNOSIS — D519 Vitamin B12 deficiency anemia, unspecified: Secondary | ICD-10-CM

## 2019-06-12 DIAGNOSIS — F17210 Nicotine dependence, cigarettes, uncomplicated: Secondary | ICD-10-CM

## 2019-06-12 DIAGNOSIS — D649 Anemia, unspecified: Secondary | ICD-10-CM | POA: Diagnosis present

## 2019-06-12 DIAGNOSIS — Z960 Presence of urogenital implants: Secondary | ICD-10-CM

## 2019-06-12 DIAGNOSIS — R338 Other retention of urine: Secondary | ICD-10-CM

## 2019-06-12 DIAGNOSIS — Z79899 Other long term (current) drug therapy: Secondary | ICD-10-CM | POA: Diagnosis not present

## 2019-06-12 DIAGNOSIS — Z87440 Personal history of urinary (tract) infections: Secondary | ICD-10-CM | POA: Diagnosis not present

## 2019-06-12 DIAGNOSIS — R6883 Chills (without fever): Secondary | ICD-10-CM

## 2019-06-12 DIAGNOSIS — R5381 Other malaise: Secondary | ICD-10-CM | POA: Diagnosis not present

## 2019-06-12 DIAGNOSIS — N390 Urinary tract infection, site not specified: Secondary | ICD-10-CM | POA: Insufficient documentation

## 2019-06-12 DIAGNOSIS — Z888 Allergy status to other drugs, medicaments and biological substances status: Secondary | ICD-10-CM | POA: Diagnosis not present

## 2019-06-12 DIAGNOSIS — Z887 Allergy status to serum and vaccine status: Secondary | ICD-10-CM | POA: Diagnosis not present

## 2019-06-12 LAB — BASIC METABOLIC PANEL
Anion gap: 13 (ref 5–15)
BUN: <5 mg/dL — ABNORMAL LOW (ref 6–20)
CO2: 23 mmol/L (ref 22–32)
Calcium: 7.7 mg/dL — ABNORMAL LOW (ref 8.9–10.3)
Chloride: 105 mmol/L (ref 98–111)
Creatinine, Ser: 0.77 mg/dL (ref 0.44–1.00)
GFR calc Af Amer: >60 mL/min (ref >60)
GFR calc non Af Amer: >60 mL/min (ref >60)
Glucose, Bld: 107 mg/dL — ABNORMAL HIGH (ref 70–99)
Potassium: 3.1 mmol/L — ABNORMAL LOW (ref 3.5–5.1)
Sodium: 141 mmol/L (ref 135–145)

## 2019-06-12 LAB — CBC
HCT: 31.9 % — ABNORMAL LOW (ref 36.0–46.0)
Hemoglobin: 10.4 g/dL — ABNORMAL LOW (ref 12.0–15.0)
MCH: 34.3 pg — ABNORMAL HIGH (ref 26.0–34.0)
MCHC: 32.6 g/dL (ref 30.0–36.0)
MCV: 105.3 fL — ABNORMAL HIGH (ref 80.0–100.0)
Platelets: 322 10*3/uL (ref 150–400)
RBC: 3.03 MIL/uL — ABNORMAL LOW (ref 3.87–5.11)
RDW: 14.7 % (ref 11.5–15.5)
WBC: 4.1 10*3/uL (ref 4.0–10.5)
nRBC: 0 % (ref 0.0–0.2)

## 2019-06-12 LAB — POCT URINALYSIS DIPSTICK
Glucose, UA: NEGATIVE
Ketones, UA: 15
Nitrite, UA: POSITIVE
Protein, UA: POSITIVE — AB
Spec Grav, UA: 1.025 (ref 1.010–1.025)
Urobilinogen, UA: >=8 E.U./dL — AB
pH, UA: 7 (ref 5.0–8.0)

## 2019-06-12 MED ORDER — CEFDINIR 300 MG PO CAPS: 300.0000 mg | ORAL_CAPSULE | Freq: Two times a day (BID) | ORAL | 0 refills | Status: DC

## 2019-06-12 NOTE — Assessment & Plan Note (Signed)
Malaise, nausea and vomiting: Patient presents today with abrupt onset malaise, nausea and vomiting since early this morning.  She denies any changes in her routine including denying symptoms such as fever, chills or joint pain.  She denied cough or shortness of breath as well.  Her symptoms are concerning for UTI. This is supported by her indwelling foley catheter as well as her point-of-care urinalysis.  She is endorsing burning with urination.  Patient CBC does not demonstrate a leukocytosis with a WBC count of 4.1.  I do not think she needs admission criteria.   Plan: Treat with a 5-day course of cefdinir 30 mg twice daily She has been given strict return precautions

## 2019-06-12 NOTE — Assessment & Plan Note (Signed)
Urinary retention: To see [facility] tomorrow. Catheter will in position. She does endorse burning with urination today as well as nausea (see UTI). Etiology uncertain.   Plan: F/up with Urology BMP today with improvement in creatinine 0.77 from 0.95.

## 2019-06-12 NOTE — Patient Instructions (Addendum)
FOLLOW-UP INSTRUCTIONS When: As needed What to bring: All of your medications  Please make certain to keep the appointment with the urologist tomorrow. I have ordered for a course of cefdinir for our UTI. Please pick this up as well as the Zofran for nausea and vomiting. Please return to the ER if you do not improve or worsen and can not keep your meds down.   As always if your symptoms worsen, fail to improve, or you develop other concerning symptoms, please notify our office or visit the local ER if we are unavailable. Symptoms including fever, chills, weakness, should not be ignored and should encourage you to visit the ED if we are unavailable by phone or the symptoms are severe.  Thank you for your visit to the [facility] today. If you have any questions or concerns please call us at [phone].

## 2019-06-12 NOTE — Assessment & Plan Note (Signed)
Patient complains of intermittent pain with swallowing that has led to weight loss. I was unable to more fully investigate this during our visit today due to her malaise but per her request and given the weight loss I agree that an EGD is recommended.  Referral to GI placed.

## 2019-06-12 NOTE — Assessment & Plan Note (Signed)
Hypokalemia: Potassium remains low at 3.1, She is unable to tolerate potassium supplements but has agreed to increase her dietary potassium in foods like bananas for the next several days. Etiology uncertain. Obtain repeat BMP in ~2 weeks. I called and notified the patient of her lab results and to schedule an appointment in 2 weeks for follow-up.

## 2019-06-12 NOTE — Assessment & Plan Note (Signed)
Patient Hgb improved to 10.4 today from 8.3 on discharge. She is being treated with B12 injections for her macrocytic anemia. Continue to follow.

## 2019-06-12 NOTE — Progress Notes (Signed)
CC: hospital discharge follow-up and to establish care  HPI:[name] is a 58 y.o. female who presents today to establish care establish care. Please see individual problem based A/P for details.  Past Medical History:  Diagnosis Date  . Anxiety   . Arthritis   . Asthma   . Complication of anesthesia    nausea  . COPD (chronic obstructive pulmonary disease) ([place])   . Depression   . Guillain-Barre ([place]) 2018  . H/O alcohol abuse   . Hypertension    pt denies  . PONV (postoperative nausea and vomiting)   . Reflux   . Suicide attempt by multiple drug overdose ([place]) 2014   Review of Systems:   Review of Systems  Constitutional: Positive for chills. Negative for diaphoresis and fever.  HENT: Negative for ear pain and sinus pain.   Eyes: Negative for blurred vision, photophobia and redness.  Respiratory: Negative for cough and shortness of breath.   Cardiovascular: Negative for chest pain and leg swelling.  Gastrointestinal: Positive for nausea (Since this morning) and vomiting (x2 this morning). Negative for constipation and diarrhea.  Genitourinary: Negative for flank pain, frequency and urgency.  Musculoskeletal: Negative for myalgias.  Neurological: Negative for dizziness and headaches.  Psychiatric/Behavioral: Negative for depression. The patient is not nervous/anxious.    Family History: Family History  Problem Relation Age of Onset  . Diabetes Father   . Hypertension Father 64       heart attack  . Kidney disease Father   . Heart attack Father   . Heart failure Father   . Dementia Father   . Breast cancer Mother 30       lump removed, bile duct cancer  . COPD Mother   . Hypertension Brother   . Allergic rhinitis Paternal Grandfather   Personally confirmed  Social History: Social History   Tobacco Use  . Smoking status: Current Every Day Smoker    Packs/day: 1.00    Years: 40.00    Pack years: 40.00    Types: Cigarettes  . Smokeless tobacco:  Never Used  Substance Use Topics  . Alcohol use: Yes    Comment: 1-2 glasses a month  . Drug use: No  I Personally confirmed the above to be correct   PMHx: Past Medical History:  Diagnosis Date  . Anxiety   . Arthritis   . Asthma   . Complication of anesthesia    nausea  . COPD (chronic obstructive pulmonary disease) ([place])   . Depression   . Guillain-Barre ([place]) 2018  . H/O alcohol abuse   . Hypertension    pt denies  . PONV (postoperative nausea and vomiting)   . Reflux   . Suicide attempt by multiple drug overdose [facility]) 2014   Surgical Hx: Laparoscopic ovarian tubal   Allergies: Lipitor and influenza vaccine  Physical Exam: Vitals:   06/12/19 1007  BP: 112/88  Pulse: (!) 116  Temp: 98 F (36.7 C)  TempSrc: Oral  SpO2: 100%  Weight: 123 lb 3.2 oz (55.9 kg)  Height: 5\' 6"  (1.676 m)   General: A/O x4, in no acute distress, afebrile, nondiaphoretic HEENT: PEERL, EMO intact Cardio: RRR, no mrg's  Pulmonary: CTA bilaterally, no wheezing or crackles  Abdomen: Bowel sounds normal, soft, nontender  MSK: BLE nontender, nonedematous Neuro: Alert, CNII-XII grossly intact, conversational, strength 5/5 in the upper and lower extremities bilaterally, normal gait Psych: Appropriate affect, not depressed in appearance, engages well  Assessment & Plan:  See Encounters Tab for problem based charting.  Patient discussed with Dr. Daryll Drown

## 2019-06-13 NOTE — Progress Notes (Signed)
Internal Medicine Clinic Attending  Case discussed with Dr. Harbrecht at the time of the visit.  We reviewed the resident's history and exam and pertinent patient test results.  I agree with the assessment, diagnosis, and plan of care documented in the resident's note.   

## 2019-06-26 ENCOUNTER — Encounter: Payer: Self-pay | Admitting: *Deleted

## 2019-07-17 ENCOUNTER — Encounter (HOSPITAL_COMMUNITY): Payer: Self-pay

## 2019-07-17 ENCOUNTER — Emergency Department (HOSPITAL_COMMUNITY): Payer: Medicaid Other

## 2019-07-17 ENCOUNTER — Other Ambulatory Visit: Payer: Self-pay

## 2019-07-17 ENCOUNTER — Emergency Department (HOSPITAL_COMMUNITY)
Admission: EM | Admit: 2019-07-17 | Discharge: 2019-07-17 | Disposition: A | Discharge status: Home / Self Care | Payer: Medicaid Other | Attending: Emergency Medicine | Admitting: Emergency Medicine

## 2019-07-17 ENCOUNTER — Telehealth: Payer: Self-pay | Admitting: *Deleted

## 2019-07-17 DIAGNOSIS — R600 Localized edema: Secondary | ICD-10-CM | POA: Diagnosis not present

## 2019-07-17 DIAGNOSIS — F17210 Nicotine dependence, cigarettes, uncomplicated: Secondary | ICD-10-CM | POA: Insufficient documentation

## 2019-07-17 DIAGNOSIS — Z7982 Long term (current) use of aspirin: Secondary | ICD-10-CM | POA: Diagnosis not present

## 2019-07-17 DIAGNOSIS — R2233 Localized swelling, mass and lump, upper limb, bilateral: Secondary | ICD-10-CM | POA: Diagnosis present

## 2019-07-17 DIAGNOSIS — J449 Chronic obstructive pulmonary disease, unspecified: Secondary | ICD-10-CM | POA: Diagnosis not present

## 2019-07-17 DIAGNOSIS — Z887 Allergy status to serum and vaccine status: Secondary | ICD-10-CM | POA: Diagnosis not present

## 2019-07-17 DIAGNOSIS — Z888 Allergy status to other drugs, medicaments and biological substances status: Secondary | ICD-10-CM | POA: Diagnosis not present

## 2019-07-17 DIAGNOSIS — G610 Guillain-Barre syndrome: Secondary | ICD-10-CM | POA: Insufficient documentation

## 2019-07-17 DIAGNOSIS — Z79899 Other long term (current) drug therapy: Secondary | ICD-10-CM | POA: Diagnosis not present

## 2019-07-17 DIAGNOSIS — R609 Edema, unspecified: Secondary | ICD-10-CM

## 2019-07-17 LAB — CBC
HCT: 32.6 % — ABNORMAL LOW (ref 36.0–46.0)
Hemoglobin: 10.6 g/dL — ABNORMAL LOW (ref 12.0–15.0)
MCH: 34 pg (ref 26.0–34.0)
MCHC: 32.5 g/dL (ref 30.0–36.0)
MCV: 104.5 fL — ABNORMAL HIGH (ref 80.0–100.0)
Platelets: 140 10*3/uL — ABNORMAL LOW (ref 150–400)
RBC: 3.12 MIL/uL — ABNORMAL LOW (ref 3.87–5.11)
RDW: 13.7 % (ref 11.5–15.5)
WBC: 3.7 10*3/uL — ABNORMAL LOW (ref 4.0–10.5)
nRBC: 0 % (ref 0.0–0.2)

## 2019-07-17 LAB — BASIC METABOLIC PANEL
Anion gap: 12 (ref 5–15)
BUN: 9 mg/dL (ref 6–20)
CO2: 18 mmol/L — ABNORMAL LOW (ref 22–32)
Calcium: 7.2 mg/dL — ABNORMAL LOW (ref 8.9–10.3)
Chloride: 108 mmol/L (ref 98–111)
Creatinine, Ser: 0.94 mg/dL (ref 0.44–1.00)
GFR calc Af Amer: >60 mL/min (ref >60)
GFR calc non Af Amer: >60 mL/min (ref >60)
Glucose, Bld: 145 mg/dL — ABNORMAL HIGH (ref 70–99)
Potassium: 3 mmol/L — ABNORMAL LOW (ref 3.5–5.1)
Sodium: 138 mmol/L (ref 135–145)

## 2019-07-17 LAB — TROPONIN I (HIGH SENSITIVITY): Troponin I (High Sensitivity): 4 ng/L (ref <18)

## 2019-07-17 LAB — I-STAT BETA HCG BLOOD, ED (MC, WL, AP ONLY): I-stat hCG, quantitative: 7 m[IU]/mL — ABNORMAL HIGH (ref <5)

## 2019-07-17 NOTE — ED Provider Notes (Signed)
[facility] EMERGENCY DEPARTMENT Provider Note   CSN: [id number] Arrival date & time: 07/17/19  1612     History Chief Complaint  Patient presents with  . Leg Swelling    Catherine Butler is a 59 y.o. female.  59 year old female presents with complaint of swelling in her hands and her feet for the past 5 days.  Patient states that when she wakes up her hands and feet are swollen, get progressively better throughout the day.  Patient called her PCP who told her this could be from her heart and to come to the emergency room.  Patient denies chest pain or shortness of breath, no changes in diet or activity.  Denies pain in her hands or feet and states that they have currently improved while she has been sitting in the bed in the emergency room today.  No other complaints or concerns.        Past Medical History:  Diagnosis Date  . Anxiety   . Arthritis   . Asthma   . Complication of anesthesia    nausea  . COPD (chronic obstructive pulmonary disease) ([place])   . Depression   . Guillain-Barre ([place]) 2018  . H/O alcohol abuse   . Hypertension    pt denies  . PONV (postoperative nausea and vomiting)   . Reflux   . Suicide attempt by multiple drug overdose [facility]) 2014    Patient Active Problem List   Diagnosis Date Noted  . Infection due to urethral catheter ([place]) 06/12/2019  . Urinary retention 06/12/2019  . Odynophagia 06/12/2019  . Anemia 06/12/2019  . Acute renal failure (ARF) ([place]) 06/02/2019  . High anion gap metabolic acidosis 17/40/8144  . Hyponatremia 06/02/2019  . Acute urinary retention   . Protein-calorie malnutrition, severe 07/21/2017  . Colonic mass 07/18/2017  . History of prolonged Q-T interval on ECG 07/06/2017  . Tachycardia   . Generalized anxiety disorder   . Debility 06/23/2017  . Constipation   . Quadriplegia and quadriparesis ([place])   . Guillain Barr syndrome ([place])   . Hypokalemia 06/21/2017  . Sinus tachycardia 06/21/2017  .  Essential hypertension 04/14/2017  . Paresthesia   . 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 Abdominal pain 01/01/2017  . Tobacco abuse 01/01/2017  . Abnormal LFTs 01/01/2017  . Depression   . COPD (chronic obstructive pulmonary disease) ([place]) 06/16/2015    Past Surgical History:  Procedure Laterality Date  . COLONOSCOPY WITH PROPOFOL N/A 07/21/2017   Procedure: COLONOSCOPY WITH PROPOFOL;  Surgeon: Milus Banister, MD;  Location: [facility];  Service: Endoscopy;  Laterality: N/A;  . LAPAROSCOPY    . laporscopy surgery for ovarian cyst  20 years ago  . TONSILLECTOMY    . TUBAL LIGATION       OB History    Gravida  3   Para  1   Term  1   Preterm      AB  1   Living  1     SAB  1   TAB      Ectopic      Multiple      Live Births              Family History  Problem Relation Age of Onset  . Diabetes Father   . Hypertension Father 3       heart attack  . Kidney disease Father   . Heart attack Father   .  Heart failure Father   . Dementia Father   . Breast cancer Mother 30       lump removed, bile duct cancer  . COPD Mother   . Hypertension Brother   . Allergic rhinitis Paternal Grandfather     Social History   Tobacco Use  . Smoking status: Current Every Day Smoker    Packs/day: 1.00    Years: 40.00    Pack years: 40.00    Types: Cigarettes  . Smokeless tobacco: Never Used  Substance Use Topics  . Alcohol use: Yes    Comment: 1-2 glasses a month  . Drug use: No    Home Medications Prior to Admission medications   Medication Sig Start Date End Date Taking? Authorizing Provider  acetaminophen (TYLENOL) 500 MG tablet Take 1,000 mg by mouth 2 (two) times daily as needed for headache.    [provider]  albuterol (PROVENTIL HFA;VENTOLIN HFA) 108 (90 Base) MCG/ACT inhaler Inhale 2 puffs into the lungs every 6 (six) hours as needed for wheezing. 08/30/18   Grayce Sessions, NP  albuterol  (PROVENTIL) (2.5 MG/3ML) 0.083% nebulizer solution Take 3 mLs (2.5 mg total) by nebulization every 6 (six) hours as needed for wheezing or shortness of breath. 08/30/18   Grayce Sessions, NP  aspirin EC 81 MG tablet Take 1 tablet (81 mg total) by mouth daily. 03/28/18   Loletta Specter, PA-C  cefdinir (OMNICEF) 300 MG capsule Take 1 capsule (300 mg total) by mouth 2 (two) times daily. 06/12/19   Lanelle Bal, MD  DULoxetine (CYMBALTA) 30 MG capsule Take 1 capsule (30 mg total) by mouth daily. 05/29/19   Grayce Sessions, NP  esomeprazole (NEXIUM) 40 MG capsule TAKE 1 CAPSULE BY MOUTH ONCE DAILY BEFORE BREAKFAST 06/07/19   Rachael Fee, MD  gabapentin (NEURONTIN) 600 MG tablet Take 2 tablets by mouth three times a day Patient taking differently: Take 1,200 mg by mouth 3 (three) times daily.  05/29/19   Grayce Sessions, NP  ibuprofen (ADVIL,MOTRIN) 200 MG tablet Take 400 mg by mouth every 6 (six) hours as needed for fever, headache, mild pain, moderate pain or cramping.    [provider]  LINZESS 145 MCG CAPS capsule TAKE 1 CAPSULE BY MOUTH ONCE DAILY BEFORE BREAKFAST. TAKE ON AN EMPTY STOMACH. Patient taking differently: Take 145 mcg by mouth daily before breakfast.  05/23/19   Meredith Pel, NP  mirtazapine (REMERON) 15 MG tablet Take 15 mg by mouth at bedtime.    [provider]  ondansetron (ZOFRAN-ODT) 4 MG disintegrating tablet Take 1 tablet (4 mg total) by mouth every 8 (eight) hours as needed for nausea or vomiting. 05/29/19   Grayce Sessions, NP  QUEtiapine (SEROQUEL) 400 MG tablet Take 400 mg by mouth at bedtime.    [provider]    Allergies    Influenza vaccines and Lipitor [atorvastatin]  Review of Systems   Review of Systems  Constitutional: Negative for chills, diaphoresis and fever.  Respiratory: Negative for shortness of breath.   Cardiovascular: Negative for chest pain and palpitations.  Gastrointestinal: Negative for  abdominal pain, constipation, diarrhea, nausea and vomiting.  Genitourinary: Negative for decreased urine volume and difficulty urinating.  Musculoskeletal: Negative for arthralgias and myalgias.  Skin: Negative for rash and wound.  Allergic/Immunologic: Negative for immunocompromised state.  Neurological: Negative for weakness.  Hematological: Negative for adenopathy.  Psychiatric/Behavioral: Negative for confusion.  All other systems reviewed and are negative.   Physical  Exam Updated Vital Signs BP (!) 133/98   Pulse (!) 104   Temp 97.6 F (36.4 C) (Oral)   Resp 18   LMP 02/22/2006   SpO2 100%   Physical Exam Vitals and nursing note reviewed.  Constitutional:      General: She is not in acute distress.    Appearance: She is well-developed. She is not diaphoretic.  HENT:     Head: Normocephalic and atraumatic.  Cardiovascular:     Rate and Rhythm: Normal rate and regular rhythm.     Pulses: Normal pulses.     Heart sounds: Normal heart sounds.  Pulmonary:     Effort: Pulmonary effort is normal.     Breath sounds: Normal breath sounds.  Abdominal:     Tenderness: There is no abdominal tenderness.  Musculoskeletal:        General: No swelling, tenderness, deformity or signs of injury.     Right lower leg: No edema.     Left lower leg: No edema.  Skin:    General: Skin is warm and dry.     Coloration: Skin is pale.     Findings: No erythema or rash.  Neurological:     Mental Status: She is alert and oriented to person, place, and time.  Psychiatric:        Behavior: Behavior normal.     ED Results / Procedures / Treatments   Labs (all labs ordered are listed, but only abnormal results are displayed) Labs Reviewed  BASIC METABOLIC PANEL - Abnormal; Notable for the following components:      Result Value   Potassium 3.0 (*)    CO2 18 (*)    Glucose, Bld 145 (*)    Calcium 7.2 (*)    All other components within normal limits  CBC - Abnormal; Notable for the  following components:   WBC 3.7 (*)    RBC 3.12 (*)    Hemoglobin 10.6 (*)    HCT 32.6 (*)    MCV 104.5 (*)    Platelets 140 (*)    All other components within normal limits  I-STAT BETA HCG BLOOD, ED ([facility], [facility], [facility] ONLY) - Abnormal; Notable for the following components:   I-stat hCG, quantitative 7.0 (*)    All other components within normal limits  TROPONIN I (HIGH SENSITIVITY)    EKG EKG Interpretation  Date/Time:  Tuesday July 17 2019 16:18:07 EST Ventricular Rate:  103 PR Interval:  126 QRS Duration: 68 QT Interval:  406 QTC Calculation: 531 R Axis:   37 Text Interpretation: Sinus tachycardia Cannot rule out Anterior infarct , age undetermined Prolonged QT Abnormal ECG some motion artifact but overall similar to previous Confirmed by Frederick Peers [phone]) on 07/17/2019 5:39:36 PM   Radiology DG Chest 2 View  Result Date: 07/17/2019 CLINICAL DATA:  Shortness of breath and leg swelling for 1 day. EXAM: CHEST - 2 VIEW COMPARISON:  06/20/2017 FINDINGS: Leftward patient rotation with kyphotic positioning. Right lung clear. Small focus of collapse/consolidation noted left costophrenic angle. Cardiopericardial silhouette is at upper limits of normal for size. Multiple old left rib fractures again noted. Midthoracic compression fracture stable since chest CT of 05/31/2018 and superior endplate compression fracture at L2 is also unchanged. Telemetry leads overlie the chest. IMPRESSION: Small focus of collapse/consolidation left costophrenic angle. Electronically Signed   By: Kennith Center M.D.   On: 07/17/2019 17:17    Procedures Procedures (including critical care time)  Medications Ordered in ED Medications -  No data to display  ED Course  I have reviewed the triage vital signs and the nursing notes.  Pertinent labs & imaging results that were available during my care of the patient were reviewed by me and considered in my medical decision making (see chart for  details).  Clinical Course as of Jul 17 1755  Tue Jul 17, 2019  [id number] 59 year old female presents with complaint of swelling in her hands and feet, worse in the morning for the past 5 days, improves throughout the day.  Patient has an indwelling Foley catheter, notes a normal amount of urinary output, denies chest pain or shortness of breath.  On exam, patient does not appear to have any swelling, she has 2 rings on each of her hands which appear to fit appropriately, no lower extremity edema noted.  Patient is slightly pale.  Exam is otherwise unremarkable.  Review of labs, patient has a mild hypokalemia with potassium of 3.0, history of same not responsive to p.o. therapy and patient manages dietarily.  CBC with mild leukopenia with white count 3.7, hemoglobin of 10.6 appears consistent with patient's baseline.  Troponin ordered in triage is consistent with baseline.  Chest x-ray shows left costophrenic blunting, no respiratory symptoms today. Case discussed with Dr. Clarene Duke, ER attending, reviewed chest x-ray, patient may be discharged follow-up with PCP.    [LM]    Clinical Course User Index [LM] Alden Hipp   MDM Rules/Calculators/A&P                      Final Clinical Impression(s) / ED Diagnoses Final diagnoses:  Peripheral edema    Rx / DC Orders ED Discharge Orders    None       Jeannie Fend, PA-C 07/17/19 1758    Little, Ambrose Finland, MD 07/17/19 2303

## 2019-07-17 NOTE — ED Triage Notes (Signed)
Pt reports bilateral feet and hand swelling since Thursday.  Pt called PCP and they referred her here to rule out a cardiac event. Pt denies chest pain but having some SOB. Pt a.o, resp e.u

## 2019-07-17 NOTE — Telephone Encounter (Signed)
Pt calls and states she was told by another MD that she needs to see her pcp

## 2019-07-17 NOTE — Discharge Instructions (Addendum)
Follow up with your doctor for recheck.  °

## 2019-07-18 ENCOUNTER — Ambulatory Visit: Payer: Medicaid Other | Admitting: Nurse Practitioner

## 2019-07-23 DIAGNOSIS — R112 Nausea with vomiting, unspecified: Secondary | ICD-10-CM

## 2019-07-23 HISTORY — DX: Nausea with vomiting, unspecified: R11.2

## 2019-07-25 ENCOUNTER — Encounter (INDEPENDENT_AMBULATORY_CARE_PROVIDER_SITE_OTHER): Payer: Self-pay | Admitting: Primary Care

## 2019-07-25 ENCOUNTER — Other Ambulatory Visit: Payer: Self-pay

## 2019-07-25 ENCOUNTER — Ambulatory Visit (INDEPENDENT_AMBULATORY_CARE_PROVIDER_SITE_OTHER): Payer: Medicaid Other | Admitting: Primary Care

## 2019-07-25 VITALS — BP 99/64 | HR 60 | Temp 97.3°F | Ht 66.0 in | Wt 148.0 lb

## 2019-07-25 DIAGNOSIS — K120 Recurrent oral aphthae: Secondary | ICD-10-CM | POA: Diagnosis not present

## 2019-07-25 DIAGNOSIS — F17200 Nicotine dependence, unspecified, uncomplicated: Secondary | ICD-10-CM

## 2019-07-25 DIAGNOSIS — Z978 Presence of other specified devices: Secondary | ICD-10-CM

## 2019-07-25 MED ORDER — TRIAMCINOLONE ACETONIDE 0.1 % MT PSTE: 1.0000 "application " | PASTE | Freq: Two times a day (BID) | OROMUCOSAL | 3 refills | Status: AC

## 2019-07-25 NOTE — Patient Instructions (Signed)
Patient in today for possible UTI and requesting to remove foley cathter. [facility] was called and appointment made to see them today to address these problems.

## 2019-07-25 NOTE — Progress Notes (Signed)
Established Patient Office Visit  Subjective:  Patient ID: Catherine Butler, female    DOB: [birth year]  Age: 59 y.o. MRN: [id number]  CC:  Chief Complaint  Patient presents with  . Urinary Tract Infection    possible yeast     HPI Catherine Butler presents for possible UTI and yeast infection she has a foley catheter. Hospital follow up discharge on 06/04/2019 and seen en ED on 07/17/2019  Past Medical History:  Diagnosis Date  . Anxiety   . Arthritis   . Asthma   . Complication of anesthesia    nausea  . COPD (chronic obstructive pulmonary disease) ([facility])   . Depression   . Guillain-Barre ([facility]) 2018  . H/O alcohol abuse   . Hypertension    pt denies  . PONV (postoperative nausea and vomiting)   . Reflux   . Suicide attempt by multiple drug overdose ([facility]) 2014    Past Surgical History:  Procedure Laterality Date  . COLONOSCOPY WITH PROPOFOL N/A 07/21/2017   Procedure: COLONOSCOPY WITH PROPOFOL;  Surgeon: Rachael Fee, MD;  Location: [facility];  Service: Endoscopy;  Laterality: N/A;  . LAPAROSCOPY    . laporscopy surgery for ovarian cyst  20 years ago  . TONSILLECTOMY    . TUBAL LIGATION      Family History  Problem Relation Age of Onset  . Diabetes Father   . Hypertension Father 50       heart attack  . Kidney disease Father   . Heart attack Father   . Heart failure Father   . Dementia Father   . Breast cancer Mother 30       lump removed, bile duct cancer  . COPD Mother   . Hypertension Brother   . Allergic rhinitis Paternal Grandfather     Social History   Socioeconomic History  . Marital status: Single    Spouse name: Not on file  . Number of children: 1  . Years of education: Not on file  . Highest education level: High school graduate  Occupational History  . Occupation: unemployed    Comment: prior Holiday representative  Tobacco Use  . Smoking status: Current Every Day Smoker    Packs/day: 1.00    Years: 40.00    Pack years: 40.00     Types: Cigarettes  . Smokeless tobacco: Never Used  Substance and Sexual Activity  . Alcohol use: Yes    Comment: 1-2 glasses a month  . Drug use: No  . Sexual activity: Not Currently  Other Topics Concern  . Not on file  Social History Narrative   Lives with mom   Caffeine- tea during the day       Social Determinants of Health   Financial Resource Strain:   . Difficulty of Paying Living Expenses: Not on file  Food Insecurity:   . Worried About Programme researcher, broadcasting/film/video in the Last Year: Not on file  . Ran Out of Food in the Last Year: Not on file  Transportation Needs:   . Lack of Transportation (Medical): Not on file  . Lack of Transportation (Non-Medical): Not on file  Physical Activity:   . Days of Exercise per Week: Not on file  . Minutes of Exercise per Session: Not on file  Stress:   . Feeling of Stress : Not on file  Social Connections:   . Frequency of Communication with Friends and Family: Not on file  . Frequency of Social  Gatherings with Friends and Family: Not on file  . Attends Religious Services: Not on file  . Active Member of Clubs or Organizations: Not on file  . Attends Banker Meetings: Not on file  . Marital Status: Not on file  Intimate Partner Violence:   . Fear of Current or Ex-Partner: Not on file  . Emotionally Abused: Not on file  . Physically Abused: Not on file  . Sexually Abused: Not on file    Outpatient Medications Prior to Visit  Medication Sig Dispense Refill  . acetaminophen (TYLENOL) 500 MG tablet Take 1,000 mg by mouth 2 (two) times daily as needed for headache.    . albuterol (PROVENTIL HFA;VENTOLIN HFA) 108 (90 Base) MCG/ACT inhaler Inhale 2 puffs into the lungs every 6 (six) hours as needed for wheezing. 1 Inhaler 11  . albuterol (PROVENTIL) (2.5 MG/3ML) 0.083% nebulizer solution Take 3 mLs (2.5 mg total) by nebulization every 6 (six) hours as needed for wheezing or shortness of breath. 75 mL 4  . aspirin EC 81 MG  tablet Take 1 tablet (81 mg total) by mouth daily. 90 tablet 3  . DULoxetine (CYMBALTA) 30 MG capsule Take 1 capsule (30 mg total) by mouth daily. 30 capsule 5  . esomeprazole (NEXIUM) 40 MG capsule TAKE 1 CAPSULE BY MOUTH ONCE DAILY BEFORE BREAKFAST 30 capsule 1  . gabapentin (NEURONTIN) 600 MG tablet Take 2 tablets by mouth three times a day (Patient taking differently: Take 1,200 mg by mouth 3 (three) times daily. ) 180 tablet 5  . ibuprofen (ADVIL,MOTRIN) 200 MG tablet Take 400 mg by mouth every 6 (six) hours as needed for fever, headache, mild pain, moderate pain or cramping.    Marland Kitchen LINZESS 145 MCG CAPS capsule TAKE 1 CAPSULE BY MOUTH ONCE DAILY BEFORE BREAKFAST. TAKE ON AN EMPTY STOMACH. (Patient taking differently: Take 145 mcg by mouth daily before breakfast. ) 30 capsule 1  . mirtazapine (REMERON) 15 MG tablet Take 15 mg by mouth at bedtime.    . ondansetron (ZOFRAN-ODT) 4 MG disintegrating tablet Take 1 tablet (4 mg total) by mouth every 8 (eight) hours as needed for nausea or vomiting. 30 tablet 3  . QUEtiapine (SEROQUEL) 400 MG tablet Take 400 mg by mouth at bedtime.    . cefdinir (OMNICEF) 300 MG capsule Take 1 capsule (300 mg total) by mouth 2 (two) times daily. 10 capsule 0   No facility-administered medications prior to visit.    Allergies  Allergen Reactions  . 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 Lipitor [Atorvastatin] Other (See Comments)    "really bad leg pain"    ROS Review of Systems  Gastrointestinal: Positive for abdominal pain.  Genitourinary: Positive for dysuria, flank pain, vaginal discharge and vaginal pain.  Psychiatric/Behavioral: The patient is nervous/anxious.        Depression decline to f/u on CSW followed by Mon[facility]      Objective:    Physical Exam  Constitutional: She is oriented to person, place, and time. She appears well-developed and well-nourished.  Cardiovascular: Normal rate and regular rhythm.   Pulmonary/Chest: Effort normal. She has rales.  Abdominal: Soft. Bowel sounds are normal.  Musculoskeletal:        General: Normal range of motion.     Cervical back: Normal range of motion.  Neurological: She is oriented to person, place, and time.  Decrease ROM uses a wheel chair   Skin: Skin is warm and dry.  Psychiatric: She has a normal mood and affect. Her behavior is normal.    BP 99/64 (BP Location: Left Arm, Patient Position: Sitting, Cuff Size: Small)   Pulse 60   Temp (!) 97.3 F (36.3 C) (Temporal)   Ht 5\' 6"  (1.676 m)   Wt 148 lb (67.1 kg) Comment: in wheelchair  LMP 02/22/2006   SpO2 97%   BMI 23.89 kg/m  Wt Readings from Last 3 Encounters:  07/25/19 148 lb (67.1 kg)  06/12/19 123 lb 3.2 oz (55.9 kg)  06/02/19 133 lb 9.6 oz (60.6 kg)     Health Maintenance Due  Topic Date Due  . MAMMOGRAM  06/23/2014    There are no preventive care reminders to display for this patient.  Lab Results  Component Value Date   TSH 0.690 04/11/2018   Lab Results  Component Value Date   WBC 3.7 (L) 07/17/2019   HGB 10.6 (L) 07/17/2019   HCT 32.6 (L) 07/17/2019   MCV 104.5 (H) 07/17/2019   PLT 140 (L) 07/17/2019   Lab Results  Component Value Date   NA 138 07/17/2019   K 3.0 (L) 07/17/2019   CO2 18 (L) 07/17/2019   GLUCOSE 145 (H) 07/17/2019   BUN 9 07/17/2019   CREATININE 0.94 07/17/2019   BILITOT 1.7 (H) 06/02/2019   ALKPHOS 342 (H) 06/02/2019   AST 24 06/02/2019   ALT 22 06/02/2019   PROT 5.6 (L) 06/02/2019   ALBUMIN 1.8 (L) 06/02/2019   CALCIUM 7.2 (L) 07/17/2019   ANIONGAP 12 07/17/2019   Lab Results  Component Value Date   CHOL 265 (H) 03/28/2018   Lab Results  Component Value Date   HDL 68 03/28/2018   Lab Results  Component Value Date   LDLCALC Comment 03/28/2018   Lab Results  Component Value Date   TRIG 965 ([place]) 03/28/2018   Lab Results  Component Value Date   CHOLHDL 3.9 03/28/2018   Lab Results  Component Value Date   HGBA1C  4.8 04/13/2017      Assessment & Plan:  Dashanti was seen today for urinary tract infection.  Diagnoses and all orders for this visit:  Foley catheter in place Patient wanting f/c remove- feels she has a UTI contacted urology appointment today at 11:00 to determine treatment  Aphthous ulcer white ,gray erythremia areas in buccal inferior vestibule, cheek buccal  treatment triamcinolone paste -Good oral hygeine   Other orders -     triamcinolone (KENALOG) 0.1 % paste; Use as directed 1 application in the mouth or throat 2 (two) times daily.    Follow-up: Return if symptoms worsen or fail to improve.    Kerin Perna, NP

## 2019-07-26 NOTE — Addendum Note (Signed)
Addended by: Neomia Dear on: 07/26/2019 06:09 PM   Modules accepted: Orders

## 2019-08-05 ENCOUNTER — Encounter (HOSPITAL_COMMUNITY): Payer: Self-pay

## 2019-08-05 ENCOUNTER — Emergency Department (HOSPITAL_COMMUNITY): Payer: Medicaid Other

## 2019-08-05 ENCOUNTER — Other Ambulatory Visit: Payer: Self-pay

## 2019-08-05 ENCOUNTER — Inpatient Hospital Stay (HOSPITAL_COMMUNITY)
Admission: EM | Admit: 2019-08-05 | Discharge: 2019-08-10 | DRG: 640 | Disposition: A | Discharge status: Home Health | Payer: Medicaid Other | Attending: Internal Medicine | Admitting: Internal Medicine

## 2019-08-05 DIAGNOSIS — Z825 Family history of asthma and other chronic lower respiratory diseases: Secondary | ICD-10-CM | POA: Diagnosis not present

## 2019-08-05 DIAGNOSIS — G610 Guillain-Barre syndrome: Secondary | ICD-10-CM | POA: Diagnosis present

## 2019-08-05 DIAGNOSIS — R112 Nausea with vomiting, unspecified: Secondary | ICD-10-CM

## 2019-08-05 DIAGNOSIS — G629 Polyneuropathy, unspecified: Secondary | ICD-10-CM | POA: Diagnosis not present

## 2019-08-05 DIAGNOSIS — G8250 Quadriplegia, unspecified: Secondary | ICD-10-CM | POA: Diagnosis present

## 2019-08-05 DIAGNOSIS — F419 Anxiety disorder, unspecified: Secondary | ICD-10-CM | POA: Diagnosis present

## 2019-08-05 DIAGNOSIS — N3000 Acute cystitis without hematuria: Secondary | ICD-10-CM

## 2019-08-05 DIAGNOSIS — G650 Sequelae of Guillain-Barre syndrome: Secondary | ICD-10-CM

## 2019-08-05 DIAGNOSIS — I10 Essential (primary) hypertension: Secondary | ICD-10-CM | POA: Diagnosis present

## 2019-08-05 DIAGNOSIS — Z20822 Contact with and (suspected) exposure to covid-19: Secondary | ICD-10-CM | POA: Diagnosis present

## 2019-08-05 DIAGNOSIS — D539 Nutritional anemia, unspecified: Secondary | ICD-10-CM | POA: Diagnosis not present

## 2019-08-05 DIAGNOSIS — K219 Gastro-esophageal reflux disease without esophagitis: Secondary | ICD-10-CM | POA: Diagnosis not present

## 2019-08-05 DIAGNOSIS — Z681 Body mass index (BMI) 19 or less, adult: Secondary | ICD-10-CM

## 2019-08-05 DIAGNOSIS — R9431 Abnormal electrocardiogram [ECG] [EKG]: Secondary | ICD-10-CM | POA: Diagnosis not present

## 2019-08-05 DIAGNOSIS — E860 Dehydration: Secondary | ICD-10-CM | POA: Diagnosis present

## 2019-08-05 DIAGNOSIS — Z79899 Other long term (current) drug therapy: Secondary | ICD-10-CM

## 2019-08-05 DIAGNOSIS — B159 Hepatitis A without hepatic coma: Secondary | ICD-10-CM | POA: Diagnosis present

## 2019-08-05 DIAGNOSIS — E8729 Other acidosis: Secondary | ICD-10-CM | POA: Diagnosis present

## 2019-08-05 DIAGNOSIS — R197 Diarrhea, unspecified: Secondary | ICD-10-CM

## 2019-08-05 DIAGNOSIS — Z7982 Long term (current) use of aspirin: Secondary | ICD-10-CM | POA: Diagnosis not present

## 2019-08-05 DIAGNOSIS — F17210 Nicotine dependence, cigarettes, uncomplicated: Secondary | ICD-10-CM

## 2019-08-05 DIAGNOSIS — E874 Mixed disorder of acid-base balance: Secondary | ICD-10-CM | POA: Diagnosis present

## 2019-08-05 DIAGNOSIS — Z8249 Family history of ischemic heart disease and other diseases of the circulatory system: Secondary | ICD-10-CM

## 2019-08-05 DIAGNOSIS — E872 Acidosis: Secondary | ICD-10-CM | POA: Diagnosis not present

## 2019-08-05 DIAGNOSIS — Z887 Allergy status to serum and vaccine status: Secondary | ICD-10-CM

## 2019-08-05 DIAGNOSIS — Z915 Personal history of self-harm: Secondary | ICD-10-CM

## 2019-08-05 DIAGNOSIS — R531 Weakness: Secondary | ICD-10-CM | POA: Diagnosis not present

## 2019-08-05 DIAGNOSIS — F329 Major depressive disorder, single episode, unspecified: Secondary | ICD-10-CM | POA: Diagnosis present

## 2019-08-05 DIAGNOSIS — J449 Chronic obstructive pulmonary disease, unspecified: Secondary | ICD-10-CM | POA: Diagnosis present

## 2019-08-05 DIAGNOSIS — R748 Abnormal levels of other serum enzymes: Secondary | ICD-10-CM | POA: Diagnosis not present

## 2019-08-05 DIAGNOSIS — E43 Unspecified severe protein-calorie malnutrition: Secondary | ICD-10-CM | POA: Diagnosis present

## 2019-08-05 DIAGNOSIS — R339 Retention of urine, unspecified: Secondary | ICD-10-CM | POA: Diagnosis present

## 2019-08-05 DIAGNOSIS — F0390 Unspecified dementia without behavioral disturbance: Secondary | ICD-10-CM | POA: Diagnosis present

## 2019-08-05 DIAGNOSIS — E876 Hypokalemia: Secondary | ICD-10-CM

## 2019-08-05 DIAGNOSIS — R1030 Lower abdominal pain, unspecified: Secondary | ICD-10-CM

## 2019-08-05 DIAGNOSIS — Z888 Allergy status to other drugs, medicaments and biological substances status: Secondary | ICD-10-CM

## 2019-08-05 LAB — POCT I-STAT 7, (LYTES, BLD GAS, ICA,H+H)
Acid-base deficit: 8 mmol/L — ABNORMAL HIGH (ref 0.0–2.0)
Bicarbonate: 15.9 mmol/L — ABNORMAL LOW (ref 20.0–28.0)
Calcium, Ion: 0.86 mmol/L — CL (ref 1.15–1.40)
HCT: 29 % — ABNORMAL LOW (ref 36.0–46.0)
Hemoglobin: 9.9 g/dL — ABNORMAL LOW (ref 12.0–15.0)
O2 Saturation: 97 %
Potassium: 2.8 mmol/L — ABNORMAL LOW (ref 3.5–5.1)
Sodium: 139 mmol/L (ref 135–145)
TCO2: 17 mmol/L — ABNORMAL LOW (ref 22–32)
pCO2 arterial: 26.3 mmHg — ABNORMAL LOW (ref 32.0–48.0)
pH, Arterial: 7.39 (ref 7.350–7.450)
pO2, Arterial: 88 mmHg (ref 83.0–108.0)

## 2019-08-05 LAB — COMPREHENSIVE METABOLIC PANEL
ALT: 35 U/L (ref 0–44)
AST: 58 U/L — ABNORMAL HIGH (ref 15–41)
Albumin: 1.7 g/dL — ABNORMAL LOW (ref 3.5–5.0)
Alkaline Phosphatase: 197 U/L — ABNORMAL HIGH (ref 38–126)
Anion gap: 20 — ABNORMAL HIGH (ref 5–15)
BUN: 9 mg/dL (ref 6–20)
CO2: 18 mmol/L — ABNORMAL LOW (ref 22–32)
Calcium: 5.9 mg/dL — CL (ref 8.9–10.3)
Chloride: 105 mmol/L (ref 98–111)
Creatinine, Ser: 1.13 mg/dL — ABNORMAL HIGH (ref 0.44–1.00)
GFR calc Af Amer: >60 mL/min (ref >60)
GFR calc non Af Amer: 54 mL/min — ABNORMAL LOW (ref >60)
Glucose, Bld: 100 mg/dL — ABNORMAL HIGH (ref 70–99)
Potassium: 2.4 mmol/L — CL (ref 3.5–5.1)
Sodium: 143 mmol/L (ref 135–145)
Total Bilirubin: 1.3 mg/dL — ABNORMAL HIGH (ref 0.3–1.2)
Total Protein: 5.2 g/dL — ABNORMAL LOW (ref 6.5–8.1)

## 2019-08-05 LAB — RENAL FUNCTION PANEL
Albumin: 1.4 g/dL — ABNORMAL LOW (ref 3.5–5.0)
Anion gap: 14 (ref 5–15)
BUN: 8 mg/dL (ref 6–20)
CO2: 15 mmol/L — ABNORMAL LOW (ref 22–32)
Calcium: 5.7 mg/dL — CL (ref 8.9–10.3)
Chloride: 109 mmol/L (ref 98–111)
Creatinine, Ser: 0.82 mg/dL (ref 0.44–1.00)
GFR calc Af Amer: >60 mL/min (ref >60)
GFR calc non Af Amer: >60 mL/min (ref >60)
Glucose, Bld: 118 mg/dL — ABNORMAL HIGH (ref 70–99)
Phosphorus: 3.1 mg/dL (ref 2.5–4.6)
Potassium: 3.1 mmol/L — ABNORMAL LOW (ref 3.5–5.1)
Sodium: 138 mmol/L (ref 135–145)

## 2019-08-05 LAB — URINALYSIS, ROUTINE W REFLEX MICROSCOPIC
Bilirubin Urine: NEGATIVE
Glucose, UA: NEGATIVE mg/dL
Hgb urine dipstick: NEGATIVE
Ketones, ur: 5 mg/dL — AB
Nitrite: NEGATIVE
Protein, ur: 100 mg/dL — AB
Specific Gravity, Urine: 1.019 (ref 1.005–1.030)
pH: 7 (ref 5.0–8.0)

## 2019-08-05 LAB — CBC
HCT: 35.4 % — ABNORMAL LOW (ref 36.0–46.0)
Hemoglobin: 11.7 g/dL — ABNORMAL LOW (ref 12.0–15.0)
MCH: 33.1 pg (ref 26.0–34.0)
MCHC: 33.1 g/dL (ref 30.0–36.0)
MCV: 100 fL (ref 80.0–100.0)
Platelets: 172 10*3/uL (ref 150–400)
RBC: 3.54 MIL/uL — ABNORMAL LOW (ref 3.87–5.11)
RDW: 15 % (ref 11.5–15.5)
WBC: 5.3 10*3/uL (ref 4.0–10.5)
nRBC: 0 % (ref 0.0–0.2)

## 2019-08-05 LAB — MAGNESIUM
Magnesium: 0.5 mg/dL — CL (ref 1.7–2.4)
Magnesium: 2.7 mg/dL — ABNORMAL HIGH (ref 1.7–2.4)

## 2019-08-05 LAB — POC SARS CORONAVIRUS 2 AG -  ED: SARS Coronavirus 2 Ag: NEGATIVE

## 2019-08-05 LAB — LIPASE, BLOOD: Lipase: 13 U/L (ref 11–51)

## 2019-08-05 LAB — SARS CORONAVIRUS 2 (TAT 6-24 HRS): SARS Coronavirus 2: NEGATIVE

## 2019-08-05 LAB — LACTIC ACID, PLASMA: Lactic Acid, Venous: 1 mmol/L (ref 0.5–1.9)

## 2019-08-05 LAB — I-STAT BETA HCG BLOOD, ED (MC, WL, AP ONLY): I-stat hCG, quantitative: 5.9 m[IU]/mL — ABNORMAL HIGH (ref <5)

## 2019-08-05 LAB — POC URINE PREG, ED: Preg Test, Ur: NEGATIVE

## 2019-08-05 MED ORDER — HYDROMORPHONE HCL 1 MG/ML IJ SOLN
0.5000 mg | Freq: Once | INTRAMUSCULAR | Status: AC
  Administered 2019-08-05: 0.5 mg via INTRAVENOUS
  Filled 2019-08-05: qty 1

## 2019-08-05 MED ORDER — MIRTAZAPINE 15 MG PO TABS
15.0000 mg | ORAL_TABLET | Freq: Every day | ORAL | Status: DC
  Administered 2019-08-06 – 2019-08-09 (×4): 15 mg via ORAL
  Filled 2019-08-05 (×5): qty 1

## 2019-08-05 MED ORDER — QUETIAPINE FUMARATE 100 MG PO TABS
400.0000 mg | ORAL_TABLET | Freq: Every day | ORAL | Status: DC
  Administered 2019-08-06 – 2019-08-09 (×4): 400 mg via ORAL
  Filled 2019-08-05 (×3): qty 4
  Filled 2019-08-05: qty 1
  Filled 2019-08-05: qty 4

## 2019-08-05 MED ORDER — PROMETHAZINE HCL 25 MG/ML IJ SOLN
12.5000 mg | Freq: Four times a day (QID) | INTRAMUSCULAR | Status: DC | PRN
  Administered 2019-08-05: 12.5 mg via INTRAVENOUS
  Filled 2019-08-05: qty 1

## 2019-08-05 MED ORDER — CALCIUM GLUCONATE-NACL 1-0.675 GM/50ML-% IV SOLN
1.0000 g | Freq: Once | INTRAVENOUS | Status: AC
  Administered 2019-08-05: 1000 mg via INTRAVENOUS
  Filled 2019-08-05: qty 50

## 2019-08-05 MED ORDER — SODIUM CHLORIDE 0.9 % IV SOLN
1.0000 g | Freq: Once | INTRAVENOUS | Status: AC
  Administered 2019-08-05: 1 g via INTRAVENOUS
  Filled 2019-08-05: qty 10

## 2019-08-05 MED ORDER — PROMETHAZINE HCL 25 MG/ML IJ SOLN
12.5000 mg | Freq: Once | INTRAMUSCULAR | Status: AC
  Administered 2019-08-05: 12.5 mg via INTRAVENOUS
  Filled 2019-08-05: qty 1

## 2019-08-05 MED ORDER — POTASSIUM CHLORIDE 10 MEQ/100ML IV SOLN
10.0000 meq | INTRAVENOUS | Status: AC
  Administered 2019-08-05 (×4): 10 meq via INTRAVENOUS
  Filled 2019-08-05 (×4): qty 100

## 2019-08-05 MED ORDER — PROMETHAZINE HCL 25 MG PO TABS: 12.5000 mg | ORAL_TABLET | Freq: Four times a day (QID) | ORAL | Status: DC | PRN

## 2019-08-05 MED ORDER — GABAPENTIN 600 MG PO TABS
1200.0000 mg | ORAL_TABLET | Freq: Three times a day (TID) | ORAL | Status: DC
  Administered 2019-08-06 – 2019-08-10 (×11): 1200 mg via ORAL
  Filled 2019-08-05 (×15): qty 2

## 2019-08-05 MED ORDER — SODIUM CHLORIDE 0.9 % IV SOLN: 1.0000 g | Freq: Once | INTRAVENOUS | Status: DC

## 2019-08-05 MED ORDER — LACTATED RINGERS IV BOLUS
1000.0000 mL | Freq: Once | INTRAVENOUS | Status: AC
  Administered 2019-08-05: 1000 mL via INTRAVENOUS

## 2019-08-05 MED ORDER — SENNOSIDES-DOCUSATE SODIUM 8.6-50 MG PO TABS: 1.0000 | ORAL_TABLET | Freq: Every evening | ORAL | Status: DC | PRN

## 2019-08-05 MED ORDER — ACETAMINOPHEN 325 MG PO TABS
650.0000 mg | ORAL_TABLET | Freq: Four times a day (QID) | ORAL | Status: DC | PRN
  Administered 2019-08-09: 650 mg via ORAL
  Filled 2019-08-05: qty 2

## 2019-08-05 MED ORDER — ALBUTEROL SULFATE (2.5 MG/3ML) 0.083% IN NEBU: 2.5000 mg | INHALATION_SOLUTION | Freq: Four times a day (QID) | RESPIRATORY_TRACT | Status: DC | PRN

## 2019-08-05 MED ORDER — ACETAMINOPHEN 650 MG RE SUPP: 650.0000 mg | Freq: Four times a day (QID) | RECTAL | Status: DC | PRN

## 2019-08-05 MED ORDER — ASPIRIN EC 81 MG PO TBEC
81.0000 mg | DELAYED_RELEASE_TABLET | Freq: Every day | ORAL | Status: DC
  Administered 2019-08-06 – 2019-08-10 (×5): 81 mg via ORAL
  Filled 2019-08-05 (×5): qty 1

## 2019-08-05 MED ORDER — PROMETHAZINE HCL 25 MG PO TABS: 25.0000 mg | ORAL_TABLET | Freq: Four times a day (QID) | ORAL | Status: DC | PRN

## 2019-08-05 MED ORDER — POTASSIUM CHLORIDE 10 MEQ/100ML IV SOLN
10.0000 meq | INTRAVENOUS | Status: AC
  Administered 2019-08-05 (×3): 10 meq via INTRAVENOUS
  Filled 2019-08-05 (×3): qty 100

## 2019-08-05 MED ORDER — ENOXAPARIN SODIUM 40 MG/0.4ML ~~LOC~~ SOLN
40.0000 mg | SUBCUTANEOUS | Status: DC
  Administered 2019-08-05 – 2019-08-09 (×5): 40 mg via SUBCUTANEOUS
  Filled 2019-08-05 (×5): qty 0.4

## 2019-08-05 MED ORDER — POTASSIUM CHLORIDE 20 MEQ/15ML (10%) PO SOLN
40.0000 meq | Freq: Once | ORAL | Status: AC
  Administered 2019-08-05: 40 meq via ORAL
  Filled 2019-08-05: qty 30

## 2019-08-05 MED ORDER — PANTOPRAZOLE SODIUM 40 MG PO TBEC
80.0000 mg | DELAYED_RELEASE_TABLET | Freq: Every day | ORAL | Status: DC
  Administered 2019-08-06 – 2019-08-10 (×5): 80 mg via ORAL
  Filled 2019-08-05 (×5): qty 2

## 2019-08-05 MED ORDER — DULOXETINE HCL 30 MG PO CPEP
30.0000 mg | ORAL_CAPSULE | Freq: Every day | ORAL | Status: DC
  Administered 2019-08-06 – 2019-08-10 (×5): 30 mg via ORAL
  Filled 2019-08-05 (×6): qty 1

## 2019-08-05 MED ORDER — IOHEXOL 300 MG/ML  SOLN
100.0000 mL | Freq: Once | INTRAMUSCULAR | Status: AC | PRN
  Administered 2019-08-05: 100 mL via INTRAVENOUS

## 2019-08-05 MED ORDER — POTASSIUM CHLORIDE CRYS ER 20 MEQ PO TBCR
40.0000 meq | EXTENDED_RELEASE_TABLET | Freq: Once | ORAL | Status: DC
  Filled 2019-08-05: qty 2

## 2019-08-05 MED ORDER — MAGNESIUM SULFATE 4 GM/100ML IV SOLN
4.0000 g | Freq: Once | INTRAVENOUS | Status: AC
  Administered 2019-08-05: 4 g via INTRAVENOUS
  Filled 2019-08-05: qty 100

## 2019-08-05 MED ORDER — LACTATED RINGERS IV SOLN
INTRAVENOUS | Status: AC
  Administered 2019-08-05: 1000 mL via INTRAVENOUS

## 2019-08-05 MED ORDER — MAGNESIUM SULFATE 2 GM/50ML IV SOLN
2.0000 g | Freq: Once | INTRAVENOUS | Status: AC
  Administered 2019-08-05: 2 g via INTRAVENOUS
  Filled 2019-08-05: qty 50

## 2019-08-05 MED ORDER — SODIUM CHLORIDE 0.9 % IV BOLUS
1000.0000 mL | Freq: Once | INTRAVENOUS | Status: AC
  Administered 2019-08-05: 1000 mL via INTRAVENOUS

## 2019-08-05 MED ORDER — PROMETHAZINE HCL 12.5 MG RE SUPP
12.5000 mg | Freq: Four times a day (QID) | RECTAL | Status: DC | PRN
  Filled 2019-08-05: qty 1

## 2019-08-05 NOTE — ED Notes (Signed)
Magnesium   0.5

## 2019-08-05 NOTE — Progress Notes (Signed)
Ordering MD paged for abnormal iCA results. RN aware.

## 2019-08-05 NOTE — ED Notes (Signed)
Medications given and charted per MAR. Tolerated well.  

## 2019-08-05 NOTE — ED Provider Notes (Signed)
[facility] EMERGENCY DEPARTMENT Provider Note   CSN: [id number] Arrival date & time: 08/05/19  1209     History Chief Complaint  Patient presents with  . Abdominal Pain  . Emesis    Catherine Butler is a 59 y.o. female with history of asthma, COPD, hypertension, Guillain-Barr syndrome, quadriplegia/quadriparesis, acute urinary retention presents for evaluation of acute onset, progressively worsening lower abdominal pain with nausea and vomiting for 2 days. Reports several episodes of nonbloody nonbilious emesis daily as well as a 2 episodes of watery nonbloody diarrhea daily. Denies suspicious food intake, known sick contacts, or recent treatment with antibiotics. Pain is sharp. Denies fevers, shortness of breath, chest pain. Reports that she self caths 4 times daily and has noted that her urine is a lot darker than usual. She has tried Zofran without relief of symptoms   The history is provided by the patient.         Past Medical History:  Diagnosis Date  . Anxiety   . Arthritis   . Asthma   . Complication of anesthesia    nausea  . COPD (chronic obstructive pulmonary disease) (HCC)   . Depression   . Guillain-Barre (HCC) 2018  . H/O alcohol abuse   . Hypertension    pt denies  . PONV (postoperative nausea and vomiting)   . Reflux   . Suicide attempt by multiple drug overdose [facility]) 2014    Patient Active Problem List   Diagnosis Date Noted  . Infection due to urethral catheter (HCC) 06/12/2019  . Urinary retention 06/12/2019  . Odynophagia 06/12/2019  . Anemia 06/12/2019  . Acute renal failure (ARF) (HCC) 06/02/2019  . High anion gap metabolic acidosis 06/02/2019  . Hyponatremia 06/02/2019  . Acute urinary retention   . Protein-calorie malnutrition, severe 07/21/2017  . Colonic mass 07/18/2017  . History of prolonged Q-T interval on ECG 07/06/2017  . Tachycardia   . Generalized anxiety disorder   . Debility 06/23/2017  . Constipation   .  Quadriplegia and quadriparesis (HCC)   . Guillain Barr syndrome (HCC)   . Hypokalemia 06/21/2017  . Sinus tachycardia 06/21/2017  . Essential hypertension 04/14/2017  . Paresthesia   . 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 Abdominal pain 01/01/2017  . Tobacco abuse 01/01/2017  . Abnormal LFTs 01/01/2017  . Depression   . COPD (chronic obstructive pulmonary disease) (HCC) 06/16/2015    Past Surgical History:  Procedure Laterality Date  . COLONOSCOPY WITH PROPOFOL N/A 07/21/2017   Procedure: COLONOSCOPY WITH PROPOFOL;  Surgeon: Rachael Fee, MD;  Location: [facility] ENDOSCOPY;  Service: Endoscopy;  Laterality: N/A;  . LAPAROSCOPY    . laporscopy surgery for ovarian cyst  20 years ago  . TONSILLECTOMY    . TUBAL LIGATION       OB History    Gravida  3   Para  1   Term  1   Preterm      AB  1   Living  1     SAB  1   TAB      Ectopic      Multiple      Live Births              Family History  Problem Relation Age of Onset  . Diabetes Father   . Hypertension Father 50       heart attack  . Kidney disease Father   . Heart attack Father   .  Heart failure Father   . Dementia Father   . Breast cancer Mother 30       lump removed, bile duct cancer  . COPD Mother   . Hypertension Brother   . Allergic rhinitis Paternal Grandfather     Social History   Tobacco Use  . Smoking status: Current Every Day Smoker    Packs/day: 1.00    Years: 40.00    Pack years: 40.00    Types: Cigarettes  . Smokeless tobacco: Never Used  Substance Use Topics  . Alcohol use: Yes    Comment: 1-2 glasses a month  . Drug use: No    Home Medications Prior to Admission medications   Medication Sig Start Date End Date Taking? Authorizing Provider  acetaminophen (TYLENOL) 500 MG tablet Take 1,000 mg by mouth 2 (two) times daily as needed for headache.   Yes [provider]  albuterol (PROVENTIL HFA;VENTOLIN HFA) 108 (90 Base)  MCG/ACT inhaler Inhale 2 puffs into the lungs every 6 (six) hours as needed for wheezing. 08/30/18  Yes Grayce Sessions, NP  albuterol (PROVENTIL) (2.5 MG/3ML) 0.083% nebulizer solution Take 3 mLs (2.5 mg total) by nebulization every 6 (six) hours as needed for wheezing or shortness of breath. 08/30/18  Yes Grayce Sessions, NP  aspirin EC 81 MG tablet Take 1 tablet (81 mg total) by mouth daily. 03/28/18  Yes Loletta Specter, PA-C  DULoxetine (CYMBALTA) 30 MG capsule Take 1 capsule (30 mg total) by mouth daily. 05/29/19  Yes Grayce Sessions, NP  esomeprazole (NEXIUM) 40 MG capsule TAKE 1 CAPSULE BY MOUTH ONCE DAILY BEFORE BREAKFAST Patient taking differently: Take 40 mg by mouth daily. Before breakfast 06/07/19  Yes Rachael Fee, MD  gabapentin (NEURONTIN) 600 MG tablet Take 2 tablets by mouth three times a day Patient taking differently: Take 1,200 mg by mouth 3 (three) times daily.  05/29/19  Yes Grayce Sessions, NP  ibuprofen (ADVIL,MOTRIN) 200 MG tablet Take 400 mg by mouth every 6 (six) hours as needed for fever, headache, mild pain, moderate pain or cramping.   Yes [provider]  LINZESS 145 MCG CAPS capsule TAKE 1 CAPSULE BY MOUTH ONCE DAILY BEFORE BREAKFAST. TAKE ON AN EMPTY STOMACH. Patient taking differently: Take 145 mcg by mouth daily before breakfast.  05/23/19  Yes Meredith Pel, NP  mirtazapine (REMERON) 15 MG tablet Take 15 mg by mouth at bedtime.   Yes [provider]  ondansetron (ZOFRAN-ODT) 4 MG disintegrating tablet Take 1 tablet (4 mg total) by mouth every 8 (eight) hours as needed for nausea or vomiting. 05/29/19  Yes Grayce Sessions, NP  QUEtiapine (SEROQUEL) 400 MG tablet Take 400 mg by mouth at bedtime.   Yes [provider]  triamcinolone (KENALOG) 0.1 % paste Use as directed 1 application in the mouth or throat 2 (two) times daily. 07/25/19  Yes Grayce Sessions, NP    Allergies    Influenza vaccines and Lipitor  [atorvastatin]  Review of Systems   Review of Systems  Constitutional: Negative for chills and fever.  Respiratory: Negative for cough and shortness of breath.   Gastrointestinal: Positive for abdominal pain, diarrhea, nausea and vomiting.  All other systems reviewed and are negative.   Physical Exam Updated Vital Signs BP 103/78   Pulse (!) 104   Temp 97.9 F (36.6 C) (Oral)   Resp 16   LMP 02/22/2006   SpO2 96%   Physical Exam Vitals and nursing note  reviewed.  Constitutional:      Appearance: She is ill-appearing.     Comments: Thin, chronically ill in appearance  HENT:     Head: Normocephalic and atraumatic.  Eyes:     General:        Right eye: No discharge.        Left eye: No discharge.     Conjunctiva/sclera: Conjunctivae normal.  Neck:     Vascular: No JVD.     Trachea: No tracheal deviation.  Cardiovascular:     Rate and Rhythm: Regular rhythm. Tachycardia present.  Pulmonary:     Effort: Pulmonary effort is normal.     Breath sounds: Normal breath sounds.  Abdominal:     General: Abdomen is flat. Bowel sounds are normal. There is no distension.     Palpations: Abdomen is soft.     Tenderness: There is generalized abdominal tenderness. There is no right CVA tenderness, left CVA tenderness, guarding or rebound.     Comments: Actively dry heaving, spitting up clear sputum  Skin:    General: Skin is warm and dry.     Findings: No erythema.  Neurological:     Mental Status: She is alert.  Psychiatric:        Behavior: Behavior normal.     ED Results / Procedures / Treatments   Labs (all labs ordered are listed, but only abnormal results are displayed) Labs Reviewed  COMPREHENSIVE METABOLIC PANEL - Abnormal; Notable for the following components:      Result Value   Potassium 2.4 (*)    CO2 18 (*)    Glucose, Bld 100 (*)    Creatinine, Ser 1.13 (*)    Calcium 5.9 (*)    Total Protein 5.2 (*)    Albumin 1.7 (*)    AST 58 (*)    Alkaline  Phosphatase 197 (*)    Total Bilirubin 1.3 (*)    GFR calc non Af Amer 54 (*)    Anion gap 20 (*)    All other components within normal limits  CBC - Abnormal; Notable for the following components:   RBC 3.54 (*)    Hemoglobin 11.7 (*)    HCT 35.4 (*)    All other components within normal limits  URINALYSIS, ROUTINE W REFLEX MICROSCOPIC - Abnormal; Notable for the following components:   Color, Urine AMBER (*)    APPearance HAZY (*)    Ketones, ur 5 (*)    Protein, ur 100 (*)    Leukocytes,Ua SMALL (*)    Bacteria, UA MANY (*)    All other components within normal limits  MAGNESIUM - Abnormal; Notable for the following components:   Magnesium 0.5 (*)    All other components within normal limits  I-STAT BETA HCG BLOOD, ED ([facility], [facility], [facility] ONLY) - Abnormal; Notable for the following components:   I-stat hCG, quantitative 5.9 (*)    All other components within normal limits  URINE CULTURE  LIPASE, BLOOD  LACTIC ACID, PLASMA  LACTIC ACID, PLASMA  POC URINE PREG, ED  POC SARS CORONAVIRUS 2 AG -  ED    EKG EKG Interpretation  Date/Time:  Sunday August 05 2019 13:46:36 EST Ventricular Rate:  118 PR Interval:    QRS Duration: 72 QT Interval:  336 QTC Calculation: 471 R Axis:   65 Text Interpretation: Sinus tachycardia Low voltage, extremity and precordial leads Borderline repolarization abnormality similar to Jul 17 2019 Confirmed by Pricilla Loveless [phone]) on 08/05/2019 2:01:49 PM  Radiology CT ABDOMEN PELVIS W CONTRAST  Result Date: 08/05/2019 CLINICAL DATA:  Abdominal pain EXAM: CT ABDOMEN AND PELVIS WITH CONTRAST TECHNIQUE: Multidetector CT imaging of the abdomen and pelvis was performed using the standard protocol following bolus administration of intravenous contrast. CONTRAST:  OMNIPAQUE IOHEXOL 300 MG/ML  SOLN COMPARISON:  None. FINDINGS: Lower chest: Lung bases demonstrate mild dependent atelectatic changes Hepatobiliary: Diffuse fatty infiltration of the liver is  noted. The gallbladder is within normal limits. Pancreas: Unremarkable. No pancreatic ductal dilatation or surrounding inflammatory changes. Spleen: Normal in size without focal abnormality. Adrenals/Urinary Tract: Adrenal glands are within normal limits. Kidneys demonstrate a normal enhancement pattern bilaterally. No renal calculi or obstructive changes are noted. Bladder is decompressed. Stomach/Bowel: Appendix is within normal limits. Colon is decompressed with mucosal enhancement and wall edema diffusely suggestive of colitis. Small bowel shows no acute abnormality. The stomach shows evidence of a large hiatal hernia. Vascular/Lymphatic: Aortic atherosclerosis. No enlarged abdominal or pelvic lymph nodes. Reproductive: Uterus and bilateral adnexa are unremarkable. Other: Free fluid is noted within the abdomen. Musculoskeletal: No acute or significant osseous findings. IMPRESSION: Decompressed colon with mucosal hyperenhancement and diffuse wall edema suggestive of colitis. Mild free fluid likely reactive in nature. No renal calculi are seen. Fatty liver. Electronically Signed   By: Alcide Clever M.D.   On: 08/05/2019 16:03    Procedures Procedures (including critical care time)  Medications Ordered in ED Medications  potassium chloride 10 mEq in 100 mL IVPB (10 mEq Intravenous New Bag/Given 08/05/19 1509)  cefTRIAXone (ROCEPHIN) 1 g in sodium chloride 0.9 % 100 mL IVPB (has no administration in time range)  sodium chloride 0.9 % bolus 1,000 mL (1,000 mLs Intravenous New Bag/Given 08/05/19 1419)  HYDROmorphone (DILAUDID) injection 0.5 mg (0.5 mg Intravenous Given 08/05/19 1358)  calcium gluconate 1 g/ 50 mL sodium chloride IVPB (1,000 mg Intravenous New Bag/Given 08/05/19 1429)  magnesium sulfate IVPB 2 g 50 mL (2 g Intravenous New Bag/Given 08/05/19 1429)  promethazine (PHENERGAN) injection 12.5 mg (12.5 mg Intravenous Given 08/05/19 1424)  iohexol (OMNIPAQUE) 300 MG/ML solution 100 mL (100 mLs  Intravenous Contrast Given 08/05/19 1539)    ED Course  I have reviewed the triage vital signs and the nursing notes.  Pertinent labs & imaging results that were available during my care of the patient were reviewed by me and considered in my medical decision making (see chart for details).    MDM Rules/Calculators/A&P                      Patient presenting for evaluation of nausea, vomiting, diarrhea, and lower abdominal pain.  She is afebrile, persistently tachycardic in the ED.  She appears ill.  She exhibits generalized abdominal tenderness on examination but no peritoneal signs.  She self cath several times a day, thinks she may have a UTI.  Will obtain lab work, imaging, UA for further evaluation.  Will give IV fluids, antiemetics, and pain medicine.  Lab work reviewed and interpreted by myself significant for hypokalemia, hypomagnesemia, hypocalcemia, and elevated anion gap.  I suspect she likely has a lactic acidosis in the setting of severe dehydration due to persistent nausea and vomiting.  We will add a lactic to assess for this.  I do not think that she is septic given she is not febrile, hypotensive, and has no leukocytosis.  She is anemic but this is at baseline.  She was given IV potassium, magnesium, and calcium.  Will hold on  oral replenishment in the setting of her persistent vomiting.  Her EKG shows sinus tachycardia, QT interval within normal limits.  Her UA is suggestive of UTI, will culture and give a dose of IV Rocephin in the ED.  3:30 PM Signed out care to oncoming provider PA Phylliss Bob.  Pending CT scan of the abdomen and pelvis.  If no acute surgical abdominal pathology, patient will still likely need admission for evaluation of significant metabolic derangements in the setting of persistent nausea vomiting and diarrhea.  Final Clinical Impression(s) / ED Diagnoses Final diagnoses:  Lower abdominal pain  Nausea vomiting and diarrhea  Hypokalemia  Hypomagnesemia    Hypocalcemia  Acute cystitis without hematuria    Rx / DC Orders ED Discharge Orders    None       Renita Papa, PA-C 08/05/19 1611    Sherwood Gambler, MD 08/05/19 1648    Sherwood Gambler, MD 08/08/19 1511

## 2019-08-05 NOTE — ED Triage Notes (Signed)
Pt reports abd pain and emesis for 2 days, pt self caths at home and states she probably has a UTI, had not cathed herself today. Pt alert, no vomiting at this time

## 2019-08-05 NOTE — ED Notes (Signed)
Pt refused PO medication d/t being nauseated.

## 2019-08-05 NOTE — ED Provider Notes (Signed)
I assumed care of patient at shift change from previous team, please see note by previous team for full H&P.  Briefly patient is a 59 year old woman with a past medical history of asthma, COPD, hypertension, Guillain-Barr, quadriplegia and urinary retention who presents today for evaluation of abdominal pain and vomiting.  She intermittently self caths as needed. Reportedly on exam earlier she was tachycardic and dry heaving without vomiting of significant amounts.  Her abdomen was reportedly generally tender to palpation without rebound or guarding.    Physical Exam  BP 103/78   Pulse (!) 104   Temp 97.9 F (36.6 C) (Oral)   Resp 16   LMP 02/22/2006   SpO2 96%   Physical Exam Vitals and nursing note reviewed.  Constitutional:      General: She is not in acute distress. Pulmonary:     Effort: No respiratory distress.  Neurological:     Mental Status: She is alert. Mental status is at baseline.     ED Course/Procedures   Clinical Course as of Aug 05 2243  Sun Aug 05, 2019  1725 Spoke with imts who will see patient for admission.    [EH]    Clinical Course User Index [EH] Cristina Gong, PA-C    Procedures   CT ABDOMEN PELVIS W CONTRAST  Result Date: 08/05/2019 CLINICAL DATA:  Abdominal pain EXAM: CT ABDOMEN AND PELVIS WITH CONTRAST TECHNIQUE: Multidetector CT imaging of the abdomen and pelvis was performed using the standard protocol following bolus administration of intravenous contrast. CONTRAST:  OMNIPAQUE IOHEXOL 300 MG/ML  SOLN COMPARISON:  None. FINDINGS: Lower chest: Lung bases demonstrate mild dependent atelectatic changes Hepatobiliary: Diffuse fatty infiltration of the liver is noted. The gallbladder is within normal limits. Pancreas: Unremarkable. No pancreatic ductal dilatation or surrounding inflammatory changes. Spleen: Normal in size without focal abnormality. Adrenals/Urinary Tract: Adrenal glands are within normal limits. Kidneys demonstrate a  normal enhancement pattern bilaterally. No renal calculi or obstructive changes are noted. Bladder is decompressed. Stomach/Bowel: Appendix is within normal limits. Colon is decompressed with mucosal enhancement and wall edema diffusely suggestive of colitis. Small bowel shows no acute abnormality. The stomach shows evidence of a large hiatal hernia. Vascular/Lymphatic: Aortic atherosclerosis. No enlarged abdominal or pelvic lymph nodes. Reproductive: Uterus and bilateral adnexa are unremarkable. Other: Free fluid is noted within the abdomen. Musculoskeletal: No acute or significant osseous findings. IMPRESSION: Decompressed colon with mucosal hyperenhancement and diffuse wall edema suggestive of colitis. Mild free fluid likely reactive in nature. No renal calculi are seen. Fatty liver. Electronically Signed   By: Alcide Clever M.D.   On: 08/05/2019 16:03    Labs Reviewed  COMPREHENSIVE METABOLIC PANEL - Abnormal; Notable for the following components:      Result Value   Potassium 2.4 (*)    CO2 18 (*)    Glucose, Bld 100 (*)    Creatinine, Ser 1.13 (*)    Calcium 5.9 (*)    Total Protein 5.2 (*)    Albumin 1.7 (*)    AST 58 (*)    Alkaline Phosphatase 197 (*)    Total Bilirubin 1.3 (*)    GFR calc non Af Amer 54 (*)    Anion gap 20 (*)    All other components within normal limits  CBC - Abnormal; Notable for the following components:   RBC 3.54 (*)    Hemoglobin 11.7 (*)    HCT 35.4 (*)    All other components within normal limits  URINALYSIS,  ROUTINE W REFLEX MICROSCOPIC - Abnormal; Notable for the following components:   Color, Urine AMBER (*)    APPearance HAZY (*)    Ketones, ur 5 (*)    Protein, ur 100 (*)    Leukocytes,Ua SMALL (*)    Bacteria, UA MANY (*)    All other components within normal limits  MAGNESIUM - Abnormal; Notable for the following components:   Magnesium 0.5 (*)    All other components within normal limits  I-STAT BETA HCG BLOOD, ED ([facility], [facility], [facility] ONLY) -  Abnormal; Notable for the following components:   I-stat hCG, quantitative 5.9 (*)    All other components within normal limits  URINE CULTURE  LIPASE, BLOOD  LACTIC ACID, PLASMA  LACTIC ACID, PLASMA  POC URINE PREG, ED  POC SARS CORONAVIRUS 2 AG -  ED    MDM   Plan is to follow-up on CT scan. Labs so far have revealed multiple electrolyte derangements suspected to be due to dehydration/poor p.o. intake. Her urine does show many bacteria today, on review of previous urines it does not appear that she normally has bacteriuria therefore she was started on Rocephin. Her magnesium was significantly low at 0.5.  Her calcium was low at 5.9, corrected for hypoalbuminemia is 7.7 which is still low. Anticipate the patient will require admission. Multiple electrolyte changes due to dehydration/poor po intake.   CT scan shows concern for colitis.  She has not had any antibiotics in the past few months.   Covid testing was ordered.    I spoke with IMTS who will see patient for admission for her multiple metabolic derangements.   Note: Portions of this report may have been transcribed using voice recognition software. Every effort was made to ensure accuracy; however, inadvertent computerized transcription errors may be present      Ollen Gross 08/05/19 2247    Charlesetta Shanks, MD 08/06/19 [phone]

## 2019-08-05 NOTE — ED Notes (Signed)
Internal med . Notified Pt can not take po Potassium

## 2019-08-05 NOTE — ED Notes (Signed)
Urine culture sent with urine sample. 

## 2019-08-05 NOTE — H&P (Signed)
Date: 08/05/2019               Patient Name:  Catherine Butler MRN: [id number]  DOB: 1961/05/29 Age / Sex: 59 y.o., female   PCP: Grayce Sessions, NP         Medical Service: Internal Medicine Teaching Service         Attending Physician: Dr. Oswaldo Done, Marquita Palms, *    First Contact: Dr. Ronelle Nigh Pager: [phone]  Second Contact: Dr. Delma Officer Pager: [phone]       After Hours (After 5p/  First Contact Pager: [phone]  weekends / holidays): Second Contact Pager: [phone]   Chief Complaint: Intractable nausea and vomiting  History of Present Illness:  Patient is a 59 year old female with past medical history significant for asthma, COPD, Guillain-Barr syndrome with quadriplegia, urinary retention who presents with a 3-week history of abdominal pain, nausea and, vomiting.  Patient states that 3 weeks ago she developed lower abdominal pain and went to a urologist and was found to have a distended bladder filled with 3 L of urine.  Since this time, she has been intermittently catheterizing herself.  Patient states that since then her pain has continued and is intermittent, minimally relieved by catheterization, not relieved by vomiting.  Over these 3 weeks, nausea and vomiting has worsened and now occurs too frequently to count, yielding nonbilious nonbloody fluid.  Patient denies recent travel, eating out, sick contacts.  Patient denies fever, chills, night sweats. Patient does report 40-50 pound weight loss since July which she attributes to decreased appetite. She reports early satiety with meals.  Patient reports that she does have occasional flare from her Guillain-Barr which causes generalized weakness, difficulty walking, dizziness, as well as well as nausea and vomiting.  Patient denies drug use, heavy alcohol use, denies taking iron, reports only taking 81 mg daily of aspirin.  Per chart review, in October 2018 patient was seen for upper and lower extremity weakness accompanied by  neuropathy and bowel/bladder incontinence. MRI spine was unremarkable and CSF did not show pleocytosis. GBS was suspected by neurology and patient was treated with 5 days of IVIG.  In January 2019, patient had similar presentation with nausea and vomiting. There was concern for colonic narrowing but colonoscopy was WNL. Patient was managed with supportive care.  Meds:  * Tylenol and ibuprofen PRN for pain * Aspirin 81 mg daily * Albuterol inhaler + nebulizer * Duloxetine 30 mg daily * Esomeprazole 40 mg daily * Gabapentin 1200 mg three times daily * Linzess 145 mcg once daily * Mirtazapine 15 mg at bedtime * Zofran PRN for nausea * Quetiapine 400 mg at bedtime  Allergies: Allergies as of 08/05/2019 - Review Complete 08/05/2019  Allergen Reaction Noted   Influenza vaccines Other (See Comments) 04/10/2018   Lipitor [atorvastatin] Other (See Comments) 04/11/2017   Past Medical History:  Diagnosis Date   Anxiety    Arthritis    Asthma    Complication of anesthesia    nausea   COPD (chronic obstructive pulmonary disease) (HCC)    Depression    Guillain-Barre (HCC) 2018   H/O alcohol abuse    Hypertension    pt denies   PONV (postoperative nausea and vomiting)    Reflux    Suicide attempt by multiple drug overdose (HCC) 2014   Family History:  * Reports multiple family members with cancer. Denies colon or stomach cancer. Endorses family history of breast cancer.  Social History:  * Reports 2  drinks/week, reports 1 pack/1-2 days since she was 59 years old, denies substance usage * Patient lives [place] with son, uses walker to get around, son helps with driving to appointments. Patient is on disability since 2018, used to work as Conservation officer, [employer]  Review of Systems: A complete ROS was negative except as per HPI.  Physical Exam: Blood pressure 102/82, pulse (!) 101, temperature 97.9 F (36.6 C), temperature source Oral, resp. rate 18, last menstrual period  02/22/2006, SpO2 100 %. Physical Exam  Constitutional: She appears distressed.  Ill-appearing  HENT:  Head: Normocephalic and atraumatic.  Eyes: EOM are normal. Right eye exhibits no discharge. Left eye exhibits no discharge.  Neck: No tracheal deviation present.  Cardiovascular: Normal rate and regular rhythm. Exam reveals no gallop and no friction rub.  No murmur heard. Pulmonary/Chest: Effort normal and breath sounds normal. No respiratory distress. She has no wheezes. She has no rales.  Abdominal: Soft. She exhibits no distension. There is abdominal tenderness (Left-sided abdominal pain to mild palpation). There is no rebound and no guarding.  Musculoskeletal:        General: No tenderness, deformity or edema. Normal range of motion.     Cervical back: Normal range of motion.  Neurological: She is alert. Coordination normal.  Cranial nerve exam without deficit. Mild diffuse weakness, no focal deficit  Skin: Skin is warm and dry. No rash noted. She is not diaphoretic. No erythema.  Psychiatric: Memory and judgment normal.    EKG: personally reviewed my interpretation is sinus tachycardia, low-voltage, flat T-waves  CMP Latest Ref Rng & Units 08/05/2019 08/05/2019 07/17/2019  Glucose 70 - 99 mg/dL - 921(J) 941(D)  BUN 6 - 20 mg/dL - 9 9  Creatinine 4.08 - 1.00 mg/dL - 1.44(Y) 1.85  Sodium 135 - 145 mmol/L 139 143 138  Potassium 3.5 - 5.1 mmol/L 2.8(L) 2.4(LL) 3.0(L)  Chloride 98 - 111 mmol/L - 105 108  CO2 22 - 32 mmol/L - 18(L) 18(L)  Calcium 8.9 - 10.3 mg/dL - 5.9(LL) 7.2(L)  Total Protein 6.5 - 8.1 g/dL - 5.2(L) -  Total Bilirubin 0.3 - 1.2 mg/dL - 1.3(H) -  Alkaline Phos 38 - 126 U/L - 197(H) -  AST 15 - 41 U/L - 58(H) -  ALT 0 - 44 U/L - 35 -    CBC Latest Ref Rng & Units 08/05/2019 08/05/2019 07/17/2019  WBC 4.0 - 10.5 K/uL - 5.3 3.7(L)  Hemoglobin 12.0 - 15.0 g/dL 6.3(J) 11.7(L) 10.6(L)  Hematocrit 36.0 - 46.0 % 29.0(L) 35.4(L) 32.6(L)  Platelets 150 - 400 K/uL - 172  140(L)   Urinalysis    Component Value Date/Time   COLORURINE AMBER (A) 08/05/2019 1454   APPEARANCEUR HAZY (A) 08/05/2019 1454   LABSPEC 1.019 08/05/2019 1454   PHURINE 7.0 08/05/2019 1454   GLUCOSEU NEGATIVE 08/05/2019 1454   HGBUR NEGATIVE 08/05/2019 1454   BILIRUBINUR NEGATIVE 08/05/2019 1454   BILIRUBINUR Moderate 06/12/2019 1102   KETONESUR 5 (A) 08/05/2019 1454   PROTEINUR 100 (A) 08/05/2019 1454   UROBILINOGEN >=8.0 (A) 06/12/2019 1102   UROBILINOGEN 0.2 05/21/2019 1150   NITRITE NEGATIVE 08/05/2019 1454   LEUKOCYTESUR SMALL (A) 08/05/2019 1454   Lipase: 13  ABG (08/05/19): PH:    7.390 PCO2:   26.3 PO2:    88.0 TCO2:   17 Acid-base deficit:  8.0 Bicarbonate:   15.9 O2 saturation:  97.0  Lactic Acid: 1.0  CT Abdomen/Pelvis W Contrast (08/05/2019): IMPRESSION: Decompressed colon with mucosal hyperenhancement and diffuse  wall edema suggestive of colitis.  Mild free fluid likely reactive in nature.  No renal calculi are seen.  Fatty liver.   Assessment & Plan by Problem: Active Problems:   Intractable nausea and vomiting  Patient is a 58 year old female with past medical history significant for asthma, COPD, Guillain-Barr syndrome with quadriplegia, urinary retention who presents with a 3-week history of abdominal pain, nausea and, vomiting.   # Hypokalemia: # Hypomagnesemia: # Hypocalcemia: # Intractable nausea/vomiting: Patient with 3 weeks of persistent nausea and vomiting. On presentation, potassium of 2.4, magnesium of 0.5, corrected calcium of 7.7. Patient is normotensive (102/82), mildly tachycardic (100-120), tachypnic, saturating well on room air. Patient without sick contacts or known toxin exposure, CT abd/pelvis without structural abnormality. Patient's symptoms with unclear etiology. Patient initially with bicarb of 18 and anion gap of 20. This may be secondary to lactic acidosis from dehydration. Lactic acid was normal but this was measured  after LR bolus. ABG shows findings consistent with primary metabolic acidosis with secondary respiratory alkalosis and additional metabolic alkalosis.  * Calcium: S/p 1 g calcium gluconate. On ABG patient with ionized calcium of 0.86 * Potassium: 2.4 on presentation. After 10 mEq IV x3, potassium of 2.8. 10 mEq Q1HR x5 ordered * Magnesium: 0.5 on presentation. Received 2 g mag sulfate IV. 4 additional grams ordered * Will follow electrolytes with Q4HR RFP,Mag x 3 * Cardiac monitoring * On mIVF with LR 100 cc/hr * Phenergan 12.5 mg IV Q6HR PRN for nausea/vomiting  # Urinary retention: Patient reports self-catheterization for past 3 weeks. She follows with urology, unclear etiology of retention.  * [id number] bladder scan + urinary cath for volume > 300 ml  # Depression/Anxiety: Continue home duloxetine 30 mg daily + mirtazipine 15 mg daily + quetiapine 400 mg at bedtime  # GERD: Continue home pantoprazole 80 mg daily  # Peripheral neuropathy: Continue gabapentin 1200 mg three times daily  # ALP elevation: ALP elevated to 197, elevation is chronic. Also with elevation of AST to 58. Viral hepatitis labs in August 2018 were negative - Hep A Ab IgM, Hep B Surface Ag, Hep B Core Ab IgM, Hep C Virus Ab  Diet: NPO DVT Ppx: Lovenox 40 mg daily Dispo: Admit patient to Inpatient with expected length of stay greater than 2 midnights.  Signed: Jeanmarie Hubert, MD 08/05/2019, 7:28 PM  Pager: [phone]

## 2019-08-05 NOTE — ED Notes (Signed)
Pt conscious, breathing, and A&OX4. No distress noted. IVF and IV medications infusing per MAR.

## 2019-08-06 ENCOUNTER — Encounter (HOSPITAL_COMMUNITY): Payer: Self-pay | Admitting: Student in an Organized Health Care Education/Training Program

## 2019-08-06 DIAGNOSIS — Z79899 Other long term (current) drug therapy: Secondary | ICD-10-CM

## 2019-08-06 LAB — RENAL FUNCTION PANEL
Albumin: 1.6 g/dL — ABNORMAL LOW (ref 3.5–5.0)
Albumin: 1.6 g/dL — ABNORMAL LOW (ref 3.5–5.0)
Albumin: 1.6 g/dL — ABNORMAL LOW (ref 3.5–5.0)
Anion gap: 19 — ABNORMAL HIGH (ref 5–15)
Anion gap: 17 — ABNORMAL HIGH (ref 5–15)
Anion gap: 14 (ref 5–15)
BUN: 9 mg/dL (ref 6–20)
BUN: 8 mg/dL (ref 6–20)
BUN: 9 mg/dL (ref 6–20)
CO2: 14 mmol/L — ABNORMAL LOW (ref 22–32)
CO2: 15 mmol/L — ABNORMAL LOW (ref 22–32)
CO2: 17 mmol/L — ABNORMAL LOW (ref 22–32)
Calcium: 6.1 mg/dL — CL (ref 8.9–10.3)
Calcium: 6.4 mg/dL — CL (ref 8.9–10.3)
Calcium: 6.3 mg/dL — CL (ref 8.9–10.3)
Chloride: 106 mmol/L (ref 98–111)
Chloride: 106 mmol/L (ref 98–111)
Chloride: 104 mmol/L (ref 98–111)
Creatinine, Ser: 0.86 mg/dL (ref 0.44–1.00)
Creatinine, Ser: 0.85 mg/dL (ref 0.44–1.00)
Creatinine, Ser: 0.73 mg/dL (ref 0.44–1.00)
GFR calc Af Amer: >60 mL/min (ref >60)
GFR calc Af Amer: >60 mL/min (ref >60)
GFR calc Af Amer: >60 mL/min (ref >60)
GFR calc non Af Amer: >60 mL/min (ref >60)
GFR calc non Af Amer: >60 mL/min (ref >60)
GFR calc non Af Amer: >60 mL/min (ref >60)
Glucose, Bld: 124 mg/dL — ABNORMAL HIGH (ref 70–99)
Glucose, Bld: 137 mg/dL — ABNORMAL HIGH (ref 70–99)
Glucose, Bld: 120 mg/dL — ABNORMAL HIGH (ref 70–99)
Phosphorus: 2.8 mg/dL (ref 2.5–4.6)
Phosphorus: 2.7 mg/dL (ref 2.5–4.6)
Phosphorus: 2.7 mg/dL (ref 2.5–4.6)
Potassium: 3.4 mmol/L — ABNORMAL LOW (ref 3.5–5.1)
Potassium: 3.6 mmol/L (ref 3.5–5.1)
Potassium: 6.1 mmol/L — ABNORMAL HIGH (ref 3.5–5.1)
Sodium: 139 mmol/L (ref 135–145)
Sodium: 138 mmol/L (ref 135–145)
Sodium: 135 mmol/L (ref 135–145)

## 2019-08-06 LAB — CBC
HCT: 35.3 % — ABNORMAL LOW (ref 36.0–46.0)
Hemoglobin: 11.7 g/dL — ABNORMAL LOW (ref 12.0–15.0)
MCH: 33.1 pg (ref 26.0–34.0)
MCHC: 33.1 g/dL (ref 30.0–36.0)
MCV: 99.7 fL (ref 80.0–100.0)
Platelets: 166 10*3/uL (ref 150–400)
RBC: 3.54 MIL/uL — ABNORMAL LOW (ref 3.87–5.11)
RDW: 15.2 % (ref 11.5–15.5)
WBC: 9.3 10*3/uL (ref 4.0–10.5)
nRBC: 0 % (ref 0.0–0.2)

## 2019-08-06 LAB — BASIC METABOLIC PANEL
Anion gap: 13 (ref 5–15)
BUN: 5 mg/dL — ABNORMAL LOW (ref 6–20)
CO2: 18 mmol/L — ABNORMAL LOW (ref 22–32)
Calcium: 6.4 mg/dL — CL (ref 8.9–10.3)
Chloride: 105 mmol/L (ref 98–111)
Creatinine, Ser: 0.73 mg/dL (ref 0.44–1.00)
GFR calc Af Amer: >60 mL/min (ref >60)
GFR calc non Af Amer: >60 mL/min (ref >60)
Glucose, Bld: 115 mg/dL — ABNORMAL HIGH (ref 70–99)
Potassium: 3.5 mmol/L (ref 3.5–5.1)
Sodium: 136 mmol/L (ref 135–145)

## 2019-08-06 LAB — LACTIC ACID, PLASMA: Lactic Acid, Venous: 1.6 mmol/L (ref 0.5–1.9)

## 2019-08-06 LAB — MAGNESIUM: Magnesium: 2 mg/dL (ref 1.7–2.4)

## 2019-08-06 MED ORDER — EMETROL 1.87-1.87-21.5 PO SOLN
10.0000 mL | ORAL | Status: DC | PRN
  Administered 2019-08-06: 10 mL via ORAL
  Filled 2019-08-06: qty 118

## 2019-08-06 MED ORDER — POTASSIUM CHLORIDE 10 MEQ/100ML IV SOLN
10.0000 meq | INTRAVENOUS | Status: AC
  Administered 2019-08-06 (×5): 10 meq via INTRAVENOUS
  Filled 2019-08-06 (×5): qty 100

## 2019-08-06 MED ORDER — PROMETHAZINE HCL 25 MG PO TABS: 25.0000 mg | ORAL_TABLET | Freq: Four times a day (QID) | ORAL | Status: DC | PRN

## 2019-08-06 MED ORDER — PROMETHAZINE HCL 12.5 MG RE SUPP
12.5000 mg | Freq: Four times a day (QID) | RECTAL | Status: DC
  Filled 2019-08-06 (×9): qty 1

## 2019-08-06 MED ORDER — ALUM & MAG HYDROXIDE-SIMETH 200-200-20 MG/5ML PO SUSP
30.0000 mL | Freq: Once | ORAL | Status: DC
  Filled 2019-08-06: qty 30

## 2019-08-06 MED ORDER — PROMETHAZINE HCL 25 MG PO TABS
25.0000 mg | ORAL_TABLET | Freq: Four times a day (QID) | ORAL | Status: DC
  Administered 2019-08-07 – 2019-08-08 (×4): 25 mg via ORAL
  Filled 2019-08-06 (×4): qty 1

## 2019-08-06 MED ORDER — PROMETHAZINE HCL 25 MG/ML IJ SOLN
25.0000 mg | Freq: Four times a day (QID) | INTRAMUSCULAR | Status: DC
  Administered 2019-08-06 – 2019-08-07 (×3): 25 mg via INTRAVENOUS
  Filled 2019-08-06 (×4): qty 1

## 2019-08-06 MED ORDER — ONDANSETRON HCL 4 MG/2ML IJ SOLN
4.0000 mg | Freq: Four times a day (QID) | INTRAMUSCULAR | Status: DC
  Administered 2019-08-06: 4 mg via INTRAVENOUS
  Filled 2019-08-06: qty 2

## 2019-08-06 MED ORDER — PROMETHAZINE HCL 12.5 MG RE SUPP
12.5000 mg | Freq: Four times a day (QID) | RECTAL | Status: DC | PRN
  Filled 2019-08-06: qty 1

## 2019-08-06 MED ORDER — CALCIUM GLUCONATE-NACL 2-0.675 GM/100ML-% IV SOLN
2.0000 g | Freq: Once | INTRAVENOUS | Status: AC
  Administered 2019-08-06: 2000 mg via INTRAVENOUS
  Filled 2019-08-06: qty 100

## 2019-08-06 MED ORDER — PROMETHAZINE HCL 25 MG/ML IJ SOLN: 25.0000 mg | Freq: Four times a day (QID) | INTRAMUSCULAR | Status: DC | PRN

## 2019-08-06 MED ORDER — LOPERAMIDE HCL 2 MG PO CAPS: 2.0000 mg | ORAL_CAPSULE | ORAL | Status: DC | PRN

## 2019-08-06 MED ORDER — PROMETHAZINE HCL 25 MG PO TABS: 25.0000 mg | ORAL_TABLET | ORAL | Status: DC | PRN

## 2019-08-06 MED ORDER — CALCIUM GLUCONATE-NACL 1-0.675 GM/50ML-% IV SOLN
1.0000 g | Freq: Once | INTRAVENOUS | Status: AC
  Administered 2019-08-06: 1000 mg via INTRAVENOUS
  Filled 2019-08-06: qty 50

## 2019-08-06 MED ORDER — HYDROMORPHONE HCL 1 MG/ML IJ SOLN
0.5000 mg | Freq: Once | INTRAMUSCULAR | Status: AC
  Administered 2019-08-06: 0.5 mg via INTRAVENOUS
  Filled 2019-08-06: qty 0.5

## 2019-08-06 MED ORDER — PROMETHAZINE HCL 25 MG/ML IJ SOLN
12.5000 mg | INTRAMUSCULAR | Status: DC | PRN
  Administered 2019-08-06: 12.5 mg via INTRAVENOUS
  Filled 2019-08-06 (×2): qty 1

## 2019-08-06 MED ORDER — LIDOCAINE VISCOUS HCL 2 % MT SOLN
15.0000 mL | Freq: Once | OROMUCOSAL | Status: AC
  Administered 2019-08-06: 15 mL via ORAL
  Filled 2019-08-06: qty 15

## 2019-08-06 MED ORDER — LACTATED RINGERS IV BOLUS
1000.0000 mL | Freq: Once | INTRAVENOUS | Status: AC
  Administered 2019-08-06: 1000 mL via INTRAVENOUS

## 2019-08-06 MED ORDER — PROMETHAZINE HCL 12.5 MG RE SUPP: 12.5000 mg | RECTAL | Status: DC | PRN

## 2019-08-06 MED ORDER — DICYCLOMINE HCL 10 MG/5ML PO SOLN
10.0000 mg | Freq: Once | ORAL | Status: AC
  Administered 2019-08-06: 10 mg via ORAL
  Filled 2019-08-06: qty 5

## 2019-08-06 MED ORDER — HYDROMORPHONE HCL 1 MG/ML IJ SOLN
0.5000 mg | INTRAMUSCULAR | Status: DC | PRN
  Administered 2019-08-07: 0.5 mg via INTRAVENOUS
  Filled 2019-08-06: qty 0.5

## 2019-08-06 MED ORDER — POTASSIUM CHLORIDE 10 MEQ/100ML IV SOLN
INTRAVENOUS | Status: AC
  Administered 2019-08-06: 10 meq via INTRAVENOUS
  Filled 2019-08-06: qty 100

## 2019-08-06 MED ORDER — ORAL CARE MOUTH RINSE
15.0000 mL | Freq: Two times a day (BID) | OROMUCOSAL | Status: DC
  Administered 2019-08-06 – 2019-08-10 (×8): 15 mL via OROMUCOSAL

## 2019-08-06 MED ORDER — PROCHLORPERAZINE EDISYLATE 10 MG/2ML IJ SOLN
5.0000 mg | Freq: Once | INTRAMUSCULAR | Status: AC
  Administered 2019-08-06: 5 mg via INTRAVENOUS
  Filled 2019-08-06: qty 1

## 2019-08-06 MED ORDER — CHLORHEXIDINE GLUCONATE CLOTH 2 % EX PADS: 6.0000 | MEDICATED_PAD | Freq: Every day | CUTANEOUS | Status: DC

## 2019-08-06 MED ORDER — LACTATED RINGERS IV SOLN: INTRAVENOUS | Status: AC

## 2019-08-06 NOTE — Progress Notes (Addendum)
Notified MD regarding inefficacy of phenergan. No new orders at this time. Patient nausea continues to be intractable.  Addendum: new antiemetics ordered (see MAR).

## 2019-08-06 NOTE — Progress Notes (Signed)
CRITICAL VALUE STICKER  CRITICAL VALUE: Calcium 6.1  RECEIVER (on-site recipient of call): Molli Hazard RN  DATE & TIME NOTIFIED: 12/15 1245  MESSENGER (representative from lab): Clydie Braun  MD NOTIFIED: Teaching service [phone])  TIME OF NOTIFICATION: [phone]  RESPONSE: 0319, continue calcium gluconate IVPB

## 2019-08-06 NOTE — Progress Notes (Signed)
PT Cancellation Note  Patient Details Name: Catherine Butler MRN: [id number] DOB: September 02, 1960   Cancelled Treatment:    Reason Eval/Treat Not Completed: Patient not medically ready Pt with intractable N/V. Will follow   Marcy Panning 08/06/2019, 8:33 AM [place], PT, DPT Acute Rehabilitation Services Pager [phone] Office [phone]

## 2019-08-06 NOTE — Progress Notes (Signed)
OT Cancellation Note  Patient Details Name: Catherine Butler MRN: [id number] DOB: Nov 05, 1960   Cancelled Treatment:    Reason Eval/Treat Not Completed: Patient not medically ready;Other (comment)(Pt continues to have intractable N/V. OT to follow for eval.)  Will attempt OT eval at a later time.  Flora Lipps, OTR/L Acute Rehabilitation Services Pager: [phone] Office: [phone]   Lonzo Cloud 08/06/2019, 8:09 AM

## 2019-08-06 NOTE — Progress Notes (Signed)
OT Cancellation Note  Patient Details Name: Catherine Butler MRN: [id number] DOB: [birth year]   Cancelled Treatment:    Reason Eval/Treat Not Completed: Patient not medically ready(Pt continues to have intractable N/V. OT to follow for eval.)  Pt continues to not feel well and uncontrolled N/V.  Flora Lipps, OTR/L Acute Rehabilitation Services Pager: [phone] Office: [phone]    [place] 08/06/2019, 3:26 PM

## 2019-08-06 NOTE — ED Notes (Signed)
RN to RN report given to [facility] on 3E with no questions or concerns.

## 2019-08-06 NOTE — Progress Notes (Signed)
Initial Nutrition Assessment  RD working remotely.  DOCUMENTATION CODES:   Not applicable  INTERVENTION:   -RD will follow for diet advancement and add supplements as appropriate -If pt remains unable to take in nutrition orally, consider initiation of nutrition support (EN vs PN)  NUTRITION DIAGNOSIS:   Inadequate oral intake related to altered GI function, inability to eat as evidenced by NPO status.  GOAL:   Patient will meet greater than or equal to 90% of their needs  MONITOR:   Diet advancement, Labs, Weight trends, Skin, I & O's  REASON FOR ASSESSMENT:   Consult Assessment of nutrition requirement/status  ASSESSMENT:   Patient is a 59 year old female with past medical history significant for asthma, COPD, Guillain-Barr syndrome with quadriplegia, urinary retention who presents with a 3-week history of abdominal pain, nausea and, vomiting.  Patient states that 3 weeks ago she developed lower abdominal pain and went to a urologist and was found to have a distended bladder filled with 3 L of urine.  Since this time, she has been intermittently catheterizing herself.  Patient states that since then her pain has continued and is intermittent, minimally relieved by catheterization, not relieved by vomiting.  Over these 3 weeks, nausea and vomiting has worsened and now occurs too frequently to count, yielding nonbilious nonbloody fluid.  Patient denies recent travel, eating out, sick contacts.  Patient denies fever, chills, night sweats. Patient does report 40-50 pound weight loss since July which she attributes to decreased appetite. She reports early satiety with meals.  Patient reports that she does have occasional flare from her Guillain-Barr which causes generalized weakness, difficulty walking, dizziness, as well as well as nausea and vomiting.  Patient denies drug use, heavy alcohol use, denies taking iron, reports only taking 81 mg daily of aspirin.  Pt admitted with  intractable nause  Reviewed I/O's: +4 L x 24 hours   UOP: 450 ml x 24 hours  Attempted to speak with pt via phone, however, no answer. RD unable to obtain further nutrition-related history at this time.   Per chart review, pt with progressive nausea and vomiting over the past 3 weeks. Pt remains with intractable nausea and vomiting since admission and unable to take po's. She remains NPO.   Reviewed weight history. Pt has experienced a 18.5% wt loss over the past year. While not significant for time frame, it is concerning given acute history of poor oral intake.   Medication reviewed and include remeron and lactated ringers infusion @ 125 ml/hr.   Albumin has a half-life of 21 days and is strongly affected by stress response and inflammatory process, therefore, do not expect to see an improvement in this lab value during acute hospitalization. Albumin is not an indicator of nutritional status, but rather an indicator of morbidity and mortality, and recovery from acute and chronic illness.  Labs reviewed.   Diet Order:   Diet Order            Diet NPO time specified  Diet effective now              EDUCATION NEEDS:   No education needs have been identified at this time  Skin:  Skin Assessment: Reviewed RN Assessment  Last BM:  08/06/19  Height:   Ht Readings from Last 1 Encounters:  07/25/19 5\' 6"  (1.676 m)    Weight:   Wt Readings from Last 1 Encounters:  08/06/19 55.9 kg    Ideal Body Weight:  51.7 kg(adjusted for quadriplegia)  BMI:  Body mass index is 19.89 kg/m.  Estimated Nutritional Needs:   Kcal:  1700-1900  Protein:  85-100 grams  Fluid:  > 1.7 L    Loistine Chance, RD, LDN, [place] Registered Dietitian II Certified Diabetes Care and Education Specialist Please refer to [facility] for RD and/or RD on-call/weekend/after hours pager

## 2019-08-06 NOTE — Progress Notes (Signed)
Per MD request, quantifying intractable vomiting. The following is all emesis I can personally vouch for having been present. Between 0125 and 0300, patient had vomited 14 times. Between 0300 and 0427, patient vomited 6 times. Between 0427 and 0537, patient vomited approximately a dozen more times. Patient is unable to account for number of instances of emesis when I was not present from the room.

## 2019-08-06 NOTE — Progress Notes (Addendum)
CRITICAL VALUE STICKER  CRITICAL VALUE: Calcium 6.4  RECEIVER (on-site recipient of call): Doyne Keel RN  DATE & TIME NOTIFIED: 2/15 1954  MESSENGER (representative from lab): Thomes Cake  MD NOTIFIED: Lyn Hollingshead MD  TIME OF NOTIFICATION: 2003  RESPONSE: Will order IV calcium

## 2019-08-06 NOTE — Progress Notes (Signed)
  Subjective:  Patient slumped over side of bed. She has had several episodes of emesis overnight. Continues to have nausea.   Objective:    Vital Signs (last 24 hours): Vitals:   08/06/19 0145 08/06/19 0408 08/06/19 0752 08/06/19 1239  BP:  116/81 120/86 110/68  Pulse:  99 (!) 109 (!) 112  Resp:  13 18 18   Temp:  98.1 F (36.7 C) 98 F (36.7 C) 98.2 F (36.8 C)  TempSrc:  Oral  Oral  SpO2:  100% 100% 99%  Weight: 55.9 kg       Physical Exam: General Alert and answers questions appropriately, in distress  Pulmonary Clear to auscultation bilaterally without wheezes, rhonchi, or rales  Extremities No peripheral edema   BMP Latest Ref Rng & Units 08/06/2019 08/06/2019 08/05/2019  Glucose 70 - 99 mg/dL 08/07/2019) 644(I) 347(Q)  BUN 6 - 20 mg/dL 8 9 8   Creatinine 0.44 - 1.00 mg/dL 259(D 6.38  BUN/Creat Ratio 9 - 23 - - -  Sodium 135 - 145 mmol/L 138 139 138  Potassium 3.5 - 5.1 mmol/L 3.6 3.4(L) 3.1(L)  Chloride 98 - 111 mmol/L 106 106 109  CO2 22 - 32 mmol/L 15(L) 14(L) 15(L)  Calcium 8.9 - 10.3 mg/dL 6.4(LL) 6.1(LL) 5.7(LL)   CBC Latest Ref Rng & Units 08/06/2019 08/05/2019 08/05/2019  WBC 4.0 - 10.5 K/uL 9.3 - 5.3  Hemoglobin 12.0 - 15.0 g/dL 11.7(L) 9.9(L) 11.7(L)  Hematocrit 36.0 - 46.0 % 35.3(L) 29.0(L) 35.4(L)  Platelets 150 - 400 K/uL 166 - 172   EKG: Sinus tachycardia with QTc of 429 msec ([place])  Assessment/Plan:   Principal Problem:   Dehydration Active Problems:   Hypokalemia   Hypomagnesemia   Protein-calorie malnutrition, severe   High anion gap metabolic acidosis   Intractable nausea and vomiting  Patient is a 59 year old female with past medical history significant for asthma, COPD, AIDP with quadriplegia, urinary retention who presented on 08/05/2019 with a 3-week history of abdominal pain, nausea, and vomiting.  # Hypokalemia: # Hypomagnesemia: # Hypocalcemia: # Intractable nausea/vomiting: Patient with 3 weeks of persistent nausea and vomiting  and with severe electrolyte derangements on presentation. On presentation, potassium of 2.4, magnesium 0.5, corrected calcium of 7.7. Today [phone]) patient with potassium of 3.6, magnesium 2.0, corrected calcium of 8.0. Etiology is unclear. Patient is without obstruction on CT abdomen/pelvis and while there is evidence for colitis on CT, the absence of significant diarrhea, fevers, or elevated WBC makes infectious colitis less likely. Will continue with symptomatic management. * Scheduled phenergan 25 mg Q6HRs * Cardiac monitoring * Check EKG daily  # Urinary retention: Patient reports self-catheterization for past 3 weeks. She follows with urology, unclear etiology of retention. * 08/07/2019 bladder scan + urinary cath for volume > 300 ml  # Depression/Anxiety: Continue home duloxetine 30 mg daily + mirtazipine 15 mg daily + quetiapine 400 gm at bedtime  # GERD: Continue home pantoprazole 80 mg daily  # Peripheral neuropathy: Continue gabapentin 1200 mg three times daily  # ALP elevation: ALP elevated to 197, elevation is chronic. Also with elevation of AST to 58. Viral hepatitis labs in August 2018 were negative - Hep A Ab IgM, Hep B Surface Ag, Hep B Core Ab IgM, Hep C Virus Ab  PT/OT: Consulted, eval pending improvement in symptoms Diet: NPO DVT Ppx: Lovenox 40 mg daily Dispo: Anticipated discharge in approximately 2-3 days  [id number], MD 08/06/2019, 2:38 PM

## 2019-08-07 ENCOUNTER — Ambulatory Visit: Payer: Medicaid Other | Admitting: Nurse Practitioner

## 2019-08-07 LAB — RENAL FUNCTION PANEL
Albumin: 1.3 g/dL — ABNORMAL LOW (ref 3.5–5.0)
Anion gap: 9 (ref 5–15)
BUN: <5 mg/dL — ABNORMAL LOW (ref 6–20)
CO2: 18 mmol/L — ABNORMAL LOW (ref 22–32)
Calcium: 6.9 mg/dL — ABNORMAL LOW (ref 8.9–10.3)
Chloride: 110 mmol/L (ref 98–111)
Creatinine, Ser: 0.74 mg/dL (ref 0.44–1.00)
GFR calc Af Amer: >60 mL/min (ref >60)
GFR calc non Af Amer: >60 mL/min (ref >60)
Glucose, Bld: 106 mg/dL — ABNORMAL HIGH (ref 70–99)
Phosphorus: 2.2 mg/dL — ABNORMAL LOW (ref 2.5–4.6)
Potassium: 3.3 mmol/L — ABNORMAL LOW (ref 3.5–5.1)
Sodium: 137 mmol/L (ref 135–145)

## 2019-08-07 LAB — MAGNESIUM: Magnesium: 1.5 mg/dL — ABNORMAL LOW (ref 1.7–2.4)

## 2019-08-07 MED ORDER — POTASSIUM PHOSPHATES 15 MMOLE/5ML IV SOLN
30.0000 mmol | Freq: Once | INTRAVENOUS | Status: AC
  Administered 2019-08-07: 30 mmol via INTRAVENOUS
  Filled 2019-08-07: qty 10

## 2019-08-07 MED ORDER — HYDROMORPHONE HCL 1 MG/ML IJ SOLN
0.5000 mg | INTRAMUSCULAR | Status: DC | PRN
  Administered 2019-08-07: 0.5 mg via INTRAVENOUS
  Filled 2019-08-07: qty 0.5

## 2019-08-07 MED ORDER — POTASSIUM CHLORIDE 10 MEQ/100ML IV SOLN: 10.0000 meq | INTRAVENOUS | Status: DC

## 2019-08-07 MED ORDER — MAGNESIUM SULFATE 4 GM/100ML IV SOLN
4.0000 g | Freq: Once | INTRAVENOUS | Status: DC
  Filled 2019-08-07: qty 100

## 2019-08-07 NOTE — Evaluation (Signed)
Physical Therapy Evaluation Patient Details Name: Catherine Butler MRN: [id number] DOB: Jan 10, 1961 Today's Date: 08/07/2019   History of Present Illness  Pt is a 59 y/o female with PMH signficant for asthma, COPD, AIDP with quadriplegia, urinary retention who presented on 08/05/19 with 3 week history of abdominal pain, nausea and vomiting. Found with severe electrolyte derangements.   Clinical Impression  Patient presents with generalized weakness, pain, impaired balance and impaired mobility s/p above. Pt reports being Mod I with ADls and uses RW for ambulation PTA. Lives with son who works but reports her best friend can check on her at d/c if needed. Pt reports she is finally starting to feel a little bit better and inquiring about eating something. Today, pt requires Min A for standing and for gait training with RW due to BLE instability/weakness. HR ranged from 120-134 bpm with activity. Will follow acutely to maximize independence and mobility prior to return home.     Follow Up Recommendations Home health PT;Supervision for mobility/OOB    Equipment Recommendations  3in1 (PT)    Recommendations for Other Services       Precautions / Restrictions Precautions Precautions: Fall Precaution Comments: watch HR Restrictions Weight Bearing Restrictions: No      Mobility  Bed Mobility Overal bed mobility: Needs Assistance Bed Mobility: Sidelying to Sit   Sidelying to sit: HOB elevated;Supervision Supine to sit: Supervision     General bed mobility comments: Pt already sidelying in bed with LEs off bed, able to lift trunk without assist, use of rail.  Transfers Overall transfer level: Needs assistance Equipment used: Rolling walker (2 wheeled) Transfers: Sit to/from Stand Sit to Stand: Min assist         General transfer comment: assist to power to standing with cues for hand placement/technique.  Ambulation/Gait Ambulation/Gait assistance: Min assist Gait Distance (Feet):  80 Feet Assistive device: Rolling walker (2 wheeled) Gait Pattern/deviations: Step-through pattern;Decreased stride length;Trunk flexed;Drifts right/left Gait velocity: decreased   General Gait Details: Slow, unsteady gait with RW for support; bil knee instability noted.  no overt LOB.  Stairs            Wheelchair Mobility    Modified Rankin (Stroke Patients Only)       Balance Overall balance assessment: Needs assistance Sitting-balance support: Feet supported;No upper extremity supported Sitting balance-Leahy Scale: Fair     Standing balance support: During functional activity Standing balance-Leahy Scale: Fair Standing balance comment: Able to stand at sink and wash hands with close Min guard.                             Pertinent Vitals/Pain Pain Assessment: 0-10 Pain Score: 8  Pain Location: UEs and stomach Pain Descriptors / Indicators: Aching;Guarding Pain Intervention(s): Repositioned;Monitored during session;Limited activity within patient's tolerance    Home Living Family/patient expects to be discharged to:: Private residence Living Arrangements: Children(son) Available Help at Discharge: Family;Available PRN/intermittently;Neighbor Type of Home: House Home Access: Stairs to enter Entrance Stairs-Rails: None Entrance Stairs-Number of Steps: 3 Home Layout: One level Home Equipment: Shower seat;Wheelchair - manual;Cane - single point;Walker - 2 wheels;Bedside commode      Prior Function Level of Independence: Needs assistance   Gait / Transfers Assistance Needed: Uses RW for ambulation.  ADL's / Homemaking Assistance Needed: son assists with housework; independent with self-care  Comments: Reports her best friend which is her neighbor can check on her as needed.  Hand Dominance   Dominant Hand: Right    Extremity/Trunk Assessment   Upper Extremity Assessment Upper Extremity Assessment: Defer to OT evaluation;Generalized  weakness    Lower Extremity Assessment Lower Extremity Assessment: Generalized weakness       Communication   Communication: No difficulties  Cognition Arousal/Alertness: Awake/alert Behavior During Therapy: WFL for tasks assessed/performed Overall Cognitive Status: Within Functional Limits for tasks assessed                                 General Comments: appears WFL for basic tasks, continue assessment      General Comments General comments (skin integrity, edema, etc.): Pt with extremely dry and cracked lip- asking for chap stick.    Exercises     Assessment/Plan    PT Assessment Patient needs continued PT services  PT Problem List Decreased strength;Decreased mobility;Pain;Decreased balance;Decreased activity tolerance       PT Treatment Interventions Therapeutic activities;Gait training;Therapeutic exercise;Patient/family education;Balance training;Functional mobility training;Stair training    PT Goals (Current goals can be found in the Care Plan section)  Acute Rehab PT Goals Patient Stated Goal: to get home and feel better PT Goal Formulation: With patient Time For Goal Achievement: 08/21/19 Potential to Achieve Goals: Good    Frequency Min 3X/week   Barriers to discharge Decreased caregiver support son works but reports her neighbor can check on her (best friend)    Co-evaluation PT/OT/SLP Co-Evaluation/Treatment: Yes Reason for Co-Treatment: To address functional/ADL transfers;Other (comment)(decreased activity tolerance) PT goals addressed during session: Mobility/safety with mobility         AM-PAC PT "6 Clicks" Mobility  Outcome Measure Help needed turning from your back to your side while in a flat bed without using bedrails?: None Help needed moving from lying on your back to sitting on the side of a flat bed without using bedrails?: None Help needed moving to and from a bed to a chair (including a wheelchair)?: A Little Help  needed standing up from a chair using your arms (e.g., wheelchair or bedside chair)?: A Little Help needed to walk in hospital room?: A Little Help needed climbing 3-5 steps with a railing? : A Little 6 Click Score: 20    End of Session Equipment Utilized During Treatment: Gait belt Activity Tolerance: Patient tolerated treatment well Patient left: in bed;with call bell/phone within reach;with bed alarm set Nurse Communication: Mobility status PT Visit Diagnosis: Pain;Muscle weakness (generalized) (M62.81);Difficulty in walking, not elsewhere classified (R26.2) Pain - Right/Left: (bil) Pain - part of body: Arm(abdomen)    Time: 7846-9629 PT Time Calculation (min) (ACUTE ONLY): 16 min   Charges:   PT Evaluation $PT Eval Moderate Complexity: 1 Mod          Marisa Severin, PT, DPT Acute Rehabilitation Services Pager [phone] Office [phone]      Marguarite Arbour A Sabra Heck 08/07/2019, 12:27 PM

## 2019-08-07 NOTE — Evaluation (Signed)
Occupational Therapy Evaluation Patient Details Name: Catherine Butler MRN: [id number] DOB: 02-13-1961 Today's Date: 08/07/2019    History of Present Illness Pt is a 59 y/o female with PMH signficant for asthma, COPD, AIDP with quadriplegia, urinary retention who presented on 08/05/19 with 3 week history of abdominal pain, nausea and vomiting. Found with severe electrolyte derangements.    Clinical Impression   PTA patient independent with ADLs, using RW for mobility. Admitted for above and limited by problem list below, including impaired balance, decreased activity tolerance, endurance and stomach pain.  Patient currently requires min assist for transfers, min assist to supervision for ADLs.  She will benefit from continued OT services while admitted and after dc at [facility] level to optimize independence and safety with ADLs, mobility.     Follow Up Recommendations  Home health OT;Supervision/Assistance - 24 hour    Equipment Recommendations  3 in 1 bedside commode    Recommendations for Other Services       Precautions / Restrictions Precautions Precautions: Fall Precaution Comments: watch HR Restrictions Weight Bearing Restrictions: No      Mobility Bed Mobility Overal bed mobility: Needs Assistance Bed Mobility: Sidelying to Sit   Sidelying to sit: HOB elevated;Supervision Supine to sit: Supervision     General bed mobility comments: Pt already sidelying in bed with LEs off bed, able to lift trunk without assist, use of rail.  Transfers Overall transfer level: Needs assistance Equipment used: Rolling walker (2 wheeled) Transfers: Sit to/from Stand Sit to Stand: Min assist         General transfer comment: assist to power to standing with cues for hand placement/technique.    Balance Overall balance assessment: Needs assistance Sitting-balance support: No upper extremity supported;Feet supported Sitting balance-Leahy Scale: Fair     Standing balance support:  Bilateral upper extremity supported;During functional activity;No upper extremity supported Standing balance-Leahy Scale: Poor Standing balance comment: relaint on BUE support dynamically, min guard with 0 hand support at sink grooming                            ADL either performed or assessed with clinical judgement   ADL Overall ADL's : Needs assistance/impaired     Grooming: Min guard;Standing   Upper Body Bathing: Set up;Sitting   Lower Body Bathing: Minimal assistance;Sit to/from stand   Upper Body Dressing : Set up;Sitting   Lower Body Dressing: Sit to/from stand;Minimal assistance   Toilet Transfer: Minimal assistance;Ambulation;RW Toilet Transfer Details (indicate cue type and reason): simulated in room         Functional mobility during ADLs: Minimal assistance;Rolling walker General ADL Comments: pt limited by weakness, decreased activity tolerance and impaired balance      Vision   Vision Assessment?: No apparent visual deficits     Perception     Praxis      Pertinent Vitals/Pain Pain Assessment: 0-10 Pain Score: 8  Pain Location: UEs and stomach Pain Descriptors / Indicators: Aching;Guarding Pain Intervention(s): Repositioned;Monitored during session;Limited activity within patient's tolerance     Hand Dominance Right   Extremity/Trunk Assessment Upper Extremity Assessment Upper Extremity Assessment: Defer to OT evaluation;Generalized weakness   Lower Extremity Assessment Lower Extremity Assessment: Generalized weakness       Communication Communication Communication: No difficulties   Cognition Arousal/Alertness: Awake/alert Behavior During Therapy: WFL for tasks assessed/performed Overall Cognitive Status: Within Functional Limits for tasks assessed  General Comments: appears WFL for basic tasks, continue assessment   General Comments  pt HR up to 135 with activity      Exercises     Shoulder Instructions      Home Living Family/patient expects to be discharged to:: Private residence Living Arrangements: Children(son) Available Help at Discharge: Family;Available PRN/intermittently;Neighbor Type of Home: House Home Access: Stairs to enter [employer] of Steps: 3 Entrance Stairs-Rails: None Home Layout: One level   Alternate Level Stairs-Rails: None Bathroom Shower/Tub: Teacher, early years/pre: Standard Bathroom Accessibility: Yes   Home Equipment: Shower seat;Wheelchair - manual;Cane - single point;Walker - 2 wheels;Bedside commode          Prior Functioning/Environment Level of Independence: Needs assistance  Gait / Transfers Assistance Needed: Uses RW for ambulation. ADL's / Homemaking Assistance Needed: son assists with housework; independent with self-care   Comments: Reports her best friend which is her neighbor can check on her as needed.        OT Problem List: Decreased strength;Decreased activity tolerance;Impaired balance (sitting and/or standing);Decreased coordination;Decreased safety awareness;Decreased knowledge of use of DME or AE;Cardiopulmonary status limiting activity      OT Treatment/Interventions: Self-care/ADL training;DME and/or AE instruction;Therapeutic exercise;Balance training;Patient/family education;Therapeutic activities;Energy conservation    OT Goals(Current goals can be found in the care plan section) Acute Rehab OT Goals Patient Stated Goal: to get home and feel better OT Goal Formulation: With patient Time For Goal Achievement: 08/21/19 Potential to Achieve Goals: Good  OT Frequency: Min 2X/week   Barriers to D/C:            Co-evaluation PT/OT/SLP Co-Evaluation/Treatment: Yes Reason for Co-Treatment: To address functional/ADL transfers;Other (comment)(decreased activity tolerance) PT goals addressed during session: Mobility/safety with mobility        AM-PAC OT "6  Clicks" Daily Activity     Outcome Measure Help from another person eating meals?: A Little Help from another person taking care of personal grooming?: A Little Help from another person toileting, which includes using toliet, bedpan, or urinal?: A Little Help from another person bathing (including washing, rinsing, drying)?: A Little Help from another person to put on and taking off regular upper body clothing?: A Little Help from another person to put on and taking off regular lower body clothing?: A Little 6 Click Score: 18   End of Session Equipment Utilized During Treatment: Gait belt;Rolling walker Nurse Communication: Mobility status  Activity Tolerance: Patient tolerated treatment well Patient left: in bed;with call bell/phone within reach;with bed alarm set  OT Visit Diagnosis: Other abnormalities of gait and mobility (R26.89);Muscle weakness (generalized) (M62.81);Pain Pain - part of body: (stomach)                Time: 4098-1191 OT Time Calculation (min): 16 min Charges:  OT General Charges $OT Visit: 1 Visit OT Evaluation $OT Eval Moderate Complexity: 1 Mod  Jolaine Artist, OT Acute Rehabilitation Services Pager [phone] Office [phone]   Delight Stare 08/07/2019, 12:23 PM

## 2019-08-07 NOTE — Progress Notes (Signed)
Mews score of 2 due to tachycardia. Compared heart rate to previous values; this is not an acute change.

## 2019-08-07 NOTE — Progress Notes (Signed)
Mews score 2 due to tachycardia. This is not an acute change. MD made aware verbally during AM conversation regarding newly ordered potassium and mag runs.

## 2019-08-07 NOTE — Progress Notes (Signed)
   Vital Signs MEWS/VS Documentation       08/06/2019 2004 08/07/2019 0011 08/07/2019 0413 08/07/2019 0700   MEWS Score:  2  2  2  2    MEWS Score Color:  Yellow  Yellow  Yellow  Yellow   Resp:  18  18  18   --   Pulse:  (!) 112  (!) 117  (!) 116  --   BP:  110/79  112/77  101/74  --   Temp:  99.2 F (37.3 C)  98.7 F (37.1 C)  98.4 F (36.9 C)  --   O2 Device:  Room  --        This is not an acute change. Patient is alert and oriented x 4 and asymptomatic. MD is aware.    11-12-1976 08/07/2019,9:48 AM

## 2019-08-07 NOTE — Progress Notes (Addendum)
  Subjective:  Patient seen at bedside. Patient states her nausea and vomiting have improved. She reports continued abdominal pain that has improved with the dilaudid yesterday. Patient states she has been able to take the pills and fluids without vomiting. She wants to try soft foods today. Patient denies pain with urination, states that while she self-catheterizes she still has sensation.  Objective:     Vital Signs (last 24 hours): Vitals:   08/06/19 1239 08/06/19 2004 08/07/19 0011 08/07/19 0413  BP: 110/68 110/79 112/77 101/74  Pulse: (!) 112 (!) 112 (!) 117 (!) 116  Resp: 18 18 18 18   Temp: 98.2 F (36.8 C) 99.2 F (37.3 C) 98.7 F (37.1 C) 98.4 F (36.9 C)  TempSrc: Oral Oral Oral Oral  SpO2: 99% 90% 97% 98%  Weight:    59.7 kg   Physical Exam: General Alert and answers questions appropriately, no acute distress  Respiratory Breathing comfortably on room air, no distress  Abdominal Soft, mild tenderness to palpation in RLQ, without distention. Bowel sounds present  Extremities Mild swelling in left hand, no lower extremity edema   BMP Latest Ref Rng & Units 08/07/2019 08/06/2019 08/06/2019  Glucose 70 - 99 mg/dL 08/08/2019) 557(D) 220(U)  BUN 6 - 20 mg/dL 542(H) 5(L) 9  Creatinine 0.44 - 1.00 mg/dL <0(W 2.37 6.28  BUN/Creat Ratio 9 - 23 - - -  Sodium 135 - 145 mmol/L 137 136 135  Potassium 3.5 - 5.1 mmol/L 3.3(L) 3.5 6.1(H)  Chloride 98 - 111 mmol/L 110 105 104  CO2 22 - 32 mmol/L 18(L) 18(L) 17(L)  Calcium 8.9 - 10.3 mg/dL 6.9(L) 6.4(LL) 6.3(LL)   Magnesium: 1.5   Assessment/Plan:   Principal Problem:   Dehydration Active Problems:   Hypokalemia   Hypomagnesemia   Protein-calorie malnutrition, severe   High anion gap metabolic acidosis   Intractable nausea and vomiting  Patient is a 59 year old female with past medical history significant for asthma, COPD, AIDP with quadriplegia, urinary retention who presented on 08/05/2019 with a 3-week history of abdominal pain,  nausea, and vomiting.  # Hypokalemia # Hypomagnesemia: # Hypocalcemia: # Intractable nausea/vomiting: Patient presented with a 3-week history of persistent nausea and vomiting with severe electrolyte derangements on presentation.  On presentation, potassium of 2.4, magnesium of 0.5, corrected calcium was 7.7.  Today patient with potassium 3.3, magnesium of 1.5, corrected calcium of 9.0. Patient with symptomatic improvement today, will continue with supportive care. * Potassium Phosphate 30 mmol (~45 mEq) * Magnesium sulfate IV 4 g * Continue phenergan 25 mg Q6HR scheduled * Dilaudid 0.5 mg Q4HR PRN for abdominal pain - received 1x yesterday, and 1x this AM  # Urinary retention: Patient reports self-catheterization for past 3 weeks. She follows with urology, unclear etiology of retention. Patient with >100,000 colonies GNR. 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

## 2019-08-08 DIAGNOSIS — D539 Nutritional anemia, unspecified: Secondary | ICD-10-CM

## 2019-08-08 LAB — RENAL FUNCTION PANEL
Albumin: 1.3 g/dL — ABNORMAL LOW (ref 3.5–5.0)
Anion gap: 10 (ref 5–15)
BUN: 6 mg/dL (ref 6–20)
CO2: 18 mmol/L — ABNORMAL LOW (ref 22–32)
Calcium: 7.1 mg/dL — ABNORMAL LOW (ref 8.9–10.3)
Chloride: 109 mmol/L (ref 98–111)
Creatinine, Ser: 0.67 mg/dL (ref 0.44–1.00)
GFR calc Af Amer: >60 mL/min (ref >60)
GFR calc non Af Amer: >60 mL/min (ref >60)
Glucose, Bld: 95 mg/dL (ref 70–99)
Phosphorus: 3 mg/dL (ref 2.5–4.6)
Potassium: 3.3 mmol/L — ABNORMAL LOW (ref 3.5–5.1)
Sodium: 137 mmol/L (ref 135–145)

## 2019-08-08 LAB — CBC
HCT: 34.3 % — ABNORMAL LOW (ref 36.0–46.0)
Hemoglobin: 10.9 g/dL — ABNORMAL LOW (ref 12.0–15.0)
MCH: 33 pg (ref 26.0–34.0)
MCHC: 31.8 g/dL (ref 30.0–36.0)
MCV: 103.9 fL — ABNORMAL HIGH (ref 80.0–100.0)
Platelets: 98 10*3/uL — ABNORMAL LOW (ref 150–400)
RBC: 3.3 MIL/uL — ABNORMAL LOW (ref 3.87–5.11)
RDW: 15.5 % (ref 11.5–15.5)
WBC: 4.2 10*3/uL (ref 4.0–10.5)
nRBC: 0 % (ref 0.0–0.2)

## 2019-08-08 LAB — GLUCOSE, CAPILLARY: Glucose-Capillary: 100 mg/dL — ABNORMAL HIGH (ref 70–99)

## 2019-08-08 LAB — URINE CULTURE: Culture: >=100000 — AB

## 2019-08-08 LAB — VITAMIN B12: Vitamin B-12: 4394 pg/mL — ABNORMAL HIGH (ref 180–914)

## 2019-08-08 LAB — MAGNESIUM: Magnesium: 1.5 mg/dL — ABNORMAL LOW (ref 1.7–2.4)

## 2019-08-08 MED ORDER — MAGNESIUM SULFATE 4 GM/100ML IV SOLN
4.0000 g | Freq: Once | INTRAVENOUS | Status: AC
  Administered 2019-08-08: 4 g via INTRAVENOUS
  Filled 2019-08-08: qty 100

## 2019-08-08 MED ORDER — PROMETHAZINE HCL 25 MG PO TABS
25.0000 mg | ORAL_TABLET | Freq: Four times a day (QID) | ORAL | Status: DC | PRN
  Administered 2019-08-08: 25 mg via ORAL
  Filled 2019-08-08: qty 1

## 2019-08-08 MED ORDER — ADULT MULTIVITAMIN W/MINERALS CH
1.0000 | ORAL_TABLET | Freq: Every day | ORAL | Status: DC
  Administered 2019-08-08 – 2019-08-10 (×3): 1 via ORAL
  Filled 2019-08-08 (×3): qty 1

## 2019-08-08 MED ORDER — PROMETHAZINE HCL 6.25 MG/5ML PO SYRP
25.0000 mg | ORAL_SOLUTION | Freq: Four times a day (QID) | ORAL | Status: DC | PRN
  Filled 2019-08-08: qty 20

## 2019-08-08 MED ORDER — POTASSIUM CHLORIDE CRYS ER 20 MEQ PO TBCR
40.0000 meq | EXTENDED_RELEASE_TABLET | ORAL | Status: DC
  Filled 2019-08-08: qty 2

## 2019-08-08 MED ORDER — PROMETHAZINE HCL 25 MG PO TABS: 25.0000 mg | ORAL_TABLET | Freq: Four times a day (QID) | ORAL | Status: DC | PRN

## 2019-08-08 MED ORDER — ENSURE ENLIVE PO LIQD
237.0000 mL | Freq: Three times a day (TID) | ORAL | Status: DC
  Administered 2019-08-08 – 2019-08-10 (×5): 237 mL via ORAL

## 2019-08-08 MED ORDER — POTASSIUM CHLORIDE 10 MEQ/100ML IV SOLN
10.0000 meq | INTRAVENOUS | Status: AC
  Administered 2019-08-08 (×5): 10 meq via INTRAVENOUS
  Filled 2019-08-08 (×6): qty 100

## 2019-08-08 NOTE — Progress Notes (Signed)
Physical Therapy Treatment Patient Details Name: Catherine Butler MRN: [id number] DOB: [birth year] Today's Date: 08/08/2019    History of Present Illness Pt is a 59 y/o female with PMH signficant for asthma, COPD, AIDP with quadriplegia, urinary retention who presented on 08/05/19 with 3 week history of abdominal pain, nausea and vomiting. Found with severe electrolyte derangements.     PT Comments    Pt with mild unsteadiness with gait when distracted.  Needed cues to focus on walking and assist with RW in tight areas.  Able to increase gait to 150' with RW.  HR improved today and ranged from 96 bpm to 115 bpm. Cont POC.     Follow Up Recommendations  Home health PT;Supervision for mobility/OOB Pt reports has neighbor that can assist as needed     Equipment Recommendations  3in1 (PT)    Recommendations for Other Services       Precautions / Restrictions Precautions Precautions: Fall Precaution Comments: watch HR Restrictions Weight Bearing Restrictions: No    Mobility  Bed Mobility Overal bed mobility: Needs Assistance Bed Mobility: Supine to Sit;Sit to Supine     Supine to sit: Supervision Sit to supine: Supervision   General bed mobility comments: increased time and effort from pt  Transfers Overall transfer level: Needs assistance Equipment used: Rolling walker (2 wheeled) Transfers: Sit to/from Stand Sit to Stand: Min guard         General transfer comment: cues for hand placment; sit to stand x 2  Ambulation/Gait Ambulation/Gait assistance: Min assist Gait Distance (Feet): 150 Feet Assistive device: Rolling walker (2 wheeled) Gait Pattern/deviations: Step-through pattern;Decreased stride length;Trunk flexed;Drifts right/left Gait velocity: decreased   General Gait Details: Slow and unsteady but no overt LOB;  tended to drift R/L and get outside RW when looking into other rooms -cued to focus on walking multiple times; assist with RW in tight  spaces   Stairs             Wheelchair Mobility    Modified Rankin (Stroke Patients Only)       Balance Overall balance assessment: Needs assistance Sitting-balance support: Feet supported;No upper extremity supported Sitting balance-Leahy Scale: Fair     Standing balance support: During functional activity;Bilateral upper extremity supported Standing balance-Leahy Scale: Fair                              Cognition Arousal/Alertness: Awake/alert Behavior During Therapy: WFL for tasks assessed/performed Overall Cognitive Status: Within Functional Limits for tasks assessed                                        Exercises      General Comments General comments (skin integrity, edema, etc.): Hr 96 bpm to 115 bpm      Pertinent Vitals/Pain Pain Assessment: No/denies pain    Home Living                      Prior Function            PT Goals (current goals can now be found in the care plan section) Progress towards PT goals: Progressing toward goals    Frequency    Min 3X/week      PT Plan Current plan remains appropriate    Co-evaluation  AM-PAC PT "6 Clicks" Mobility   Outcome Measure  Help needed turning from your back to your side while in a flat bed without using bedrails?: None Help needed moving from lying on your back to sitting on the side of a flat bed without using bedrails?: None Help needed moving to and from a bed to a chair (including a wheelchair)?: None Help needed standing up from a chair using your arms (e.g., wheelchair or bedside chair)?: None Help needed to walk in hospital room?: A Little Help needed climbing 3-5 steps with a railing? : A Little 6 Click Score: 22    End of Session Equipment Utilized During Treatment: Gait belt Activity Tolerance: Patient tolerated treatment well Patient left: in bed;with call bell/phone within reach;with bed alarm set Nurse  Communication: Mobility status PT Visit Diagnosis: Pain;Muscle weakness (generalized) (M62.81);Difficulty in walking, not elsewhere classified (R26.2)     Time: 6962-9528 PT Time Calculation (min) (ACUTE ONLY): 18 min  Charges:  $Gait Training: 8-22 mins                     Catherine Butler, PT Acute Rehab Services Pager [phone] [facility] [phone] [facility] [phone]    Catherine Butler 08/08/2019, 4:41 PM

## 2019-08-08 NOTE — Plan of Care (Signed)

## 2019-08-08 NOTE — Progress Notes (Addendum)
  Subjective:  Patient states that she has not had any vomiting since yesterday. She has not had any bowel movements since admission. She was counseled regarding trying to progress her diet prior to anticipated discharge tomorrow.   Objective:    Vital Signs (last 24 hours): Vitals:   08/07/19 1155 08/07/19 1941 08/08/19 0026 08/08/19 0416  BP: 103/76 114/90 105/79 (!) 126/93  Pulse: (!) 107 (!) 106 (!) 105 98  Resp: 18 18 18 18   Temp: 98.8 F (37.1 C) 98.7 F (37.1 C) 98 F (36.7 C) 98.6 F (37 C)  TempSrc: Oral Oral Oral   SpO2: 98% 99% 97% 100%  Weight:    59 kg    Physical Exam: General Alert and answers questions appropriately, no acute distress  Abdominal Soft, tenderness to moderate palpation, without distention. Bowel sounds present  Extremities No peripheral edema in bilateral lower extremities    BMP Latest Ref Rng & Units 08/08/2019 08/07/2019 08/06/2019  Glucose 70 - 99 mg/dL 95 08/08/2019) 659(D)  BUN 6 - 20 mg/dL 6 357(S) 5(L)  Creatinine 0.44 - 1.00 mg/dL <1(X 7.93 9.03  BUN/Creat Ratio 9 - 23 - - -  Sodium 135 - 145 mmol/L 137 137 136  Potassium 3.5 - 5.1 mmol/L 3.3(L) 3.3(L) 3.5  Chloride 98 - 111 mmol/L 109 110 105  CO2 22 - 32 mmol/L 18(L) 18(L) 18(L)  Calcium 8.9 - 10.3 mg/dL 7.1(L) 6.9(L) 6.4(LL)   CBC Latest Ref Rng & Units 08/08/2019 08/06/2019 08/05/2019  WBC 4.0 - 10.5 K/uL 4.2 9.3 -  Hemoglobin 12.0 - 15.0 g/dL 10.9(L) 11.7(L) 9.9(L)  Hematocrit 36.0 - 46.0 % 34.3(L) 35.3(L) 29.0(L)  Platelets 150 - 400 K/uL PENDING 166 -   Magnesium: 1.5  Assessment/Plan:   Principal Problem:   Dehydration Active Problems:   Hypokalemia   Hypomagnesemia   Protein-calorie malnutrition, severe   High anion gap metabolic acidosis   Intractable nausea and vomiting  The patient is a 59 year old female with past medical history significant for asthma, COPD, AIDP with quadriplegia, urinary retention who presented on 08/05/2019 with a 3-week history of abdominal pain,  nausea, and vomiting.  # Hypokalemia # Hypomagnesemia # Hypocalcemia # Intractable nausea/vomiting Patient presented with a 3-week history of persistent nausea and vomiting with severe electrolyte derangements on presentation. On presentation, potassium of 2.4, magnesium of 0.5, corrected calcium of 7.7. Today patient with potassium 3.3, magnesium of 1.5, corrected calcium of 9.3. Patient with continued symptomatic improvement today, will continue with supportive care * K IV 10 mEq x6 * Mag Sulfate IV 4 g x1, not administered yesterday, will replete today * Phenergan 25 mg Q6HR PO (syrup or tablet) scheduled * QTc of 490 msec ([place] today)  # Urinary retention: Patient reports self-catheterization for past 3 weeks. She is intermittently able to void spontaneously. Urine culture demonstrated >100,000 colonies GNR. 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 MCV of 103.9. Patient with poor nutritional status. Will check Folate, B12.  PT/OT: PT recommends home health PT and 3 N 1 bedside commode. OT recommends home health OT. DME and HH orders placed Diet: Will advance from full liquid to soft diet. PO meds as tolerated DVT Ppx: Lovenox 40 mg daily Dispo: Anticipated discharge in approximately 1-2 days  * Q3RA, MD 08/08/2019, 6:29 AM

## 2019-08-08 NOTE — TOC Initial Note (Signed)
Transition of Care [facility]) - Initial/Assessment Note    Patient Details  Name: Catherine Butler MRN: [id number] Date of Birth: Sep 13, 1960  Transition of Care [facility]) CM/SW Contact:    Bethena Roys, RN Phone Number: 08/08/2019, 4:39 PM  Clinical Narrative:  Patient presented for dehydration. Plan will be to return home with home health services- Medicare.gov list provided to the patient and she chose [place]. Referral made to [facility] and the patient will be seen within 24-48 hours post transition home. Patient has durable medical equipment 3n1, RW and WC. Patient has son in the home for support when he is not working- patient has stated that she can ask her daughter-in-law to provide the supervision for her because she lives two doors down and does not work. Case Manager will continue to follow for additional transition of care needs.                 Expected Discharge Plan: [place] Barriers to Discharge: No Barriers Identified   Patient Goals and CMS Choice Patient states their goals for this hospitalization and ongoing recovery are:: "to return home" CMS Medicare.gov Compare Post Acute Care list provided to:: Patient Choice offered to / list presented to : Patient  Expected Discharge Plan and Services Expected Discharge Plan: [place] In-house Referral: NA Discharge Planning Services: CM Consult Post Acute Care Choice: [place] arrangements for the past 2 months: Single Family Home                  HH Arranged: PT, OT HH Agency: [place] Date [facility] Agency Contacted: 08/08/19 Time HH Agency Contacted: 61 Representative spoke with at [place]: Tommi Rumps  Prior Living Arrangements/Services Living arrangements for the past 2 months: [place] Lives with:: Adult Children Patient language and need for interpreter reviewed:: Yes Do you feel safe going back to the place where you live?: Yes      Need for  Family Participation in Patient Care: Yes (Comment) Care giver support system in place?: Yes (comment)   Criminal Activity/Legal Involvement Pertinent to Current Situation/Hospitalization: No - Comment as needed  Activities of Daily Living Home Assistive Devices/Equipment: Walker (specify type) ADL Screening (condition at time of admission) Patient's cognitive ability adequate to safely complete daily activities?: Yes Is the patient deaf or have difficulty hearing?: No Does the patient have difficulty seeing, even when wearing glasses/contacts?: No Does the patient have difficulty concentrating, remembering, or making decisions?: No Patient able to express need for assistance with ADLs?: Yes Does the patient have difficulty dressing or bathing?: No Independently performs ADLs?: Yes (appropriate for developmental age) Does the patient have difficulty walking or climbing stairs?: Yes Weakness of Legs: Both Weakness of Arms/Hands: None  Permission Sought/Granted Permission sought to share information with : Family Supports, Investment banker, corporate granted to share info w AGENCY: Tommi Rumps        Emotional Assessment Appearance:: Appears stated age Attitude/Demeanor/Rapport: Engaged Affect (typically observed): Accepting Orientation: : Oriented to Self, Oriented to Place, Oriented to  Time, Oriented to Situation Alcohol / Substance Use: Not Applicable Psych Involvement: No (comment)  Admission diagnosis:  Hypocalcemia [E83.51] Hypokalemia [E87.6] Hypomagnesemia [E83.42] Lower abdominal pain [R10.30] Acute cystitis without hematuria [N30.00] Nausea vomiting and diarrhea [R11.2, R19.7] Intractable nausea and vomiting [R11.2] Patient Active Problem List   Diagnosis Date Noted  . Intractable nausea and vomiting 08/05/2019  . Urinary  retention 06/12/2019  . Odynophagia 06/12/2019  . Anemia 06/12/2019  . Acute renal failure (ARF) (HCC) 06/02/2019  . High  anion gap metabolic acidosis 06/02/2019  . Hyponatremia 06/02/2019  . Acute urinary retention   . Protein-calorie malnutrition, severe 07/21/2017  . Colonic mass 07/18/2017  . Dehydration 07/18/2017  . Hypomagnesemia 07/18/2017  . History of prolonged Q-T interval on ECG 07/06/2017  . Tachycardia   . Generalized anxiety disorder   . Debility 06/23/2017  . Constipation   . Quadriplegia and quadriparesis (HCC)   . Guillain Barr syndrome (HCC)   . Hypokalemia 06/21/2017  . Sinus tachycardia 06/21/2017  . Essential hypertension 04/14/2017  . Paresthesia   . 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 Tobacco abuse 01/01/2017  . Abnormal LFTs 01/01/2017  . Depression   . COPD (chronic obstructive pulmonary disease) (HCC) 06/16/2015   PCP:  Grayce Sessions, NP Pharmacy:   [facility] [place], [place] - [address] [address] Phone: [phone] Fax: [phone]    Social Determinants of Health (SDOH) Interventions    Readmission Risk Interventions No flowsheet data found.

## 2019-08-08 NOTE — Progress Notes (Signed)
   08/08/19 1843  Urine Characteristics  Intermittent/Straight Cath (mL) 600 mL  Intermittent Catheter Size 16   Patient tolerated intermittent/straight cath well with no difficulties. 600 mL of clear, yellow urine was removed.

## 2019-08-08 NOTE — Progress Notes (Signed)
Nutrition Follow-up  DOCUMENTATION CODES:   Not applicable  INTERVENTION:   -Ensure Enlive po TID, each supplement provides 350 kcal and 20 grams of protein -MVI with minerals daily  NUTRITION DIAGNOSIS:   Inadequate oral intake related to altered GI function, inability to eat as evidenced by NPO status.  Progressing; now on soft diet  GOAL:   Patient will meet greater than or equal to 90% of their needs  Progressing   MONITOR:   Diet advancement, Labs, Weight trends, Skin, I & O's  REASON FOR ASSESSMENT:   Consult Assessment of nutrition requirement/status  ASSESSMENT:   Patient is a 59 year old female with past medical history significant for asthma, COPD, Guillain-Barr syndrome with quadriplegia, urinary retention who presents with a 3-week history of abdominal pain, nausea and, vomiting.  Patient states that 3 weeks ago she developed lower abdominal pain and went to a urologist and was found to have a distended bladder filled with 3 L of urine.  Since this time, she has been intermittently catheterizing herself.  Patient states that since then her pain has continued and is intermittent, minimally relieved by catheterization, not relieved by vomiting.  Over these 3 weeks, nausea and vomiting has worsened and now occurs too frequently to count, yielding nonbilious nonbloody fluid.  Patient denies recent travel, eating out, sick contacts.  Patient denies fever, chills, night sweats. Patient does report 40-50 pound weight loss since July which she attributes to decreased appetite. She reports early satiety with meals.  Patient reports that she does have occasional flare from her Guillain-Barr which causes generalized weakness, difficulty walking, dizziness, as well as well as nausea and vomiting.  Patient denies drug use, heavy alcohol use, denies taking iron, reports only taking 81 mg daily of aspirin.  2/16- advanced to full liquids 2/17- advanced to soft diet  Reviewed  I/O's: +1.3 L x 24 hours and +4.6 L since admission  UOP: 450 ml x 24 hours  Spoke with pt at bedside, who reports feeling better at time of visit. Pt complained of sensation of food being stuck in her throat yesterday, but has not experienced this today. Pt consuming green beans, spaghetti, and breadstick at time of visit. She shares her nausea and vomiting have improved great and is happy to be able to keep solid food down currently.   Pt shares that she has lost "a lot" of weight over the past year, which she attributes to grief from losing both her mother and father over the summer. She shares she usually consumes 1-2 meals per day (sausage and biscuits and meat, starch, and vegetable). Pt reports consuming a lot of sugary beverages and fruit juices at home.   Reviewed breakfast tray; pt consumed about 50% of full liquid diet. Noted meal completions documented at 25-50%.   Case and findings discussed with RN after visit.  Labs reviewed: K: 3.3 (on PO supplementation), Mg: 1.5 (on IV supplementation), CBGS: 100.   NUTRITION - FOCUSED PHYSICAL EXAM:    Most Recent Value  Orbital Region  No depletion  Upper Arm Region  Moderate depletion  Thoracic and Lumbar Region  No depletion  Buccal Region  No depletion  Temple Region  No depletion  Clavicle Bone Region  No depletion  Clavicle and Acromion Bone Region  No depletion  Scapular Bone Region  No depletion  Dorsal Hand  No depletion  Patellar Region  Mild depletion  Anterior Thigh Region  Mild depletion  Posterior Calf Region  Mild depletion  Edema (  RD Assessment)  None  Hair  Reviewed  Eyes  Reviewed  Mouth  Reviewed  Skin  Reviewed  Nails  Reviewed       Diet Order:   Diet Order            DIET SOFT Room service appropriate? Yes; Fluid consistency: Thin  Diet effective now              EDUCATION NEEDS:   No education needs have been identified at this time  Skin:  Skin Assessment: Reviewed RN Assessment  Last  BM:  08/06/19  Height:   Ht Readings from Last 1 Encounters:  07/25/19 5\' 6"  (1.676 m)    Weight:   Wt Readings from Last 1 Encounters:  08/08/19 59 kg    Ideal Body Weight:  51.7 kg(adjusted for quadriplegia)  BMI:  Body mass index is 20.99 kg/m.  Estimated Nutritional Needs:   Kcal:  1700-1900  Protein:  85-100 grams  Fluid:  > 1.7 L    08/10/19, RD, LDN, CDCES Registered Dietitian II Certified Diabetes Care and Education Specialist Please refer to [facility] for RD and/or RD on-call/weekend/after hours pager

## 2019-08-08 NOTE — Progress Notes (Signed)
To the best of my knowledge, the student's charting is accurate.  

## 2019-08-08 NOTE — Progress Notes (Addendum)
Pt lethargic and difficult to awaken. Vitals and CBG taken, all within normal limits. RRT notified. Stated pt stable and responded to commands. MD paged. Will continue to monitor pt.

## 2019-08-09 LAB — RENAL FUNCTION PANEL
Albumin: 1.3 g/dL — ABNORMAL LOW (ref 3.5–5.0)
Anion gap: 8 (ref 5–15)
BUN: 5 mg/dL — ABNORMAL LOW (ref 6–20)
CO2: 21 mmol/L — ABNORMAL LOW (ref 22–32)
Calcium: 7.3 mg/dL — ABNORMAL LOW (ref 8.9–10.3)
Chloride: 109 mmol/L (ref 98–111)
Creatinine, Ser: 0.68 mg/dL (ref 0.44–1.00)
GFR calc Af Amer: >60 mL/min (ref >60)
GFR calc non Af Amer: >60 mL/min (ref >60)
Glucose, Bld: 108 mg/dL — ABNORMAL HIGH (ref 70–99)
Phosphorus: 1.9 mg/dL — ABNORMAL LOW (ref 2.5–4.6)
Potassium: 3.8 mmol/L (ref 3.5–5.1)
Sodium: 138 mmol/L (ref 135–145)

## 2019-08-09 LAB — MAGNESIUM: Magnesium: 2 mg/dL (ref 1.7–2.4)

## 2019-08-09 MED ORDER — PROMETHAZINE HCL 6.25 MG/5ML PO SYRP
25.0000 mg | ORAL_SOLUTION | Freq: Four times a day (QID) | ORAL | Status: DC | PRN
  Filled 2019-08-09: qty 20

## 2019-08-09 MED ORDER — POTASSIUM & SODIUM PHOSPHATES 280-160-250 MG PO PACK
2.0000 | PACK | ORAL | Status: AC
  Administered 2019-08-09 (×3): 2 via ORAL
  Filled 2019-08-09 (×3): qty 2

## 2019-08-09 NOTE — Progress Notes (Signed)
Physical Therapy Treatment Patient Details Name: Catherine Butler MRN: [id number] DOB: 02-Sep-1960 Today's Date: 08/09/2019    History of Present Illness Pt is a 59 y/o female with PMH signficant for asthma, COPD, AIDP with quadriplegia, urinary retention who presented on 08/05/19 with 3 week history of abdominal pain, nausea and vomiting. Found with severe electrolyte derangements.     PT Comments    Pt presented with mild confusion and decreased balance today.  She was able to perform steps with mod A.  Ambulated 100' with min A but was unsteady and required min A for balance.  Systolic BP was low 976'B but this has been pts baseline and it was consistent with transfers.  If pt going home, she will need 24 hr support, which, she reports she can get.     Follow Up Recommendations  Home health PT;Supervision/Assistance - 24 hour(strongly recommend 24 hr support (pt reports she can get it))     Equipment Recommendations  3in1 (PT)    Recommendations for Other Services       Precautions / Restrictions Precautions Precautions: Fall Precaution Comments: watch HR Restrictions Weight Bearing Restrictions: No    Mobility  Bed Mobility Overal bed mobility: Needs Assistance Bed Mobility: Supine to Sit     Supine to sit: Min guard     General bed mobility comments: increased time and effort from pt; min guard for steadying  Transfers Overall transfer level: Needs assistance Equipment used: Rolling walker (2 wheeled) Transfers: Sit to/from Stand Sit to Stand: Min guard         General transfer comment: cues for hand placment; sit to stand x 3  Ambulation/Gait Ambulation/Gait assistance: Min assist Gait Distance (Feet): 100 Feet Assistive device: Rolling walker (2 wheeled) Gait Pattern/deviations: Step-through pattern;Decreased stride length;Trunk flexed;Drifts right/left Gait velocity: decreased   General Gait Details: Slow and unsteady but no overt LOB;  tended to drift R/L  and get outside RW when looking into other rooms -cued to focus on walking multiple times; assist with RW in tight spaces   Stairs Stairs: Yes Stairs assistance: Mod assist Stair Management: Step to pattern;Forwards;One rail Right Number of Stairs: 5 General stair comments: pt very unsteady, needing mod A for balance, cues for sequence, reports feels like she needs to sit down but denied dizziness   Wheelchair Mobility    Modified Rankin (Stroke Patients Only)       Balance Overall balance assessment: Needs assistance Sitting-balance support: Feet supported;No upper extremity supported Sitting balance-Leahy Scale: Poor Sitting balance - Comments: leaning L initially, once feet on floor able to maintain but with close guard   Standing balance support: During functional activity;Single extremity supported Standing balance-Leahy Scale: Poor Standing balance comment: Pt attempting to reach for items on table and took a drink of water - required min A to maintain balance                            Cognition Arousal/Alertness: Awake/alert Behavior During Therapy: WFL for tasks assessed/performed Overall Cognitive Status: No family/caregiver present to determine baseline cognitive functioning                                 General Comments: Pt was alert and oriented but occasionally making out of context statements.  Also , reported see a pack of cigarettes under the bed - nothing was there.  Exercises      General Comments General comments (skin integrity, edema, etc.): HR 110-117 bpm; O2 sats 99%;  BP as follows : sitting 102/81, standing 99/77, sitting after walking to chair 108/59      Pertinent Vitals/Pain Pain Assessment: No/denies pain    Home Living                      Prior Function            PT Goals (current goals can now be found in the care plan section) Progress towards PT goals: Progressing toward goals     Frequency           PT Plan Other (comment)(strongly recommend 24 hr support)    Co-evaluation              AM-PAC PT "6 Clicks" Mobility   Outcome Measure  Help needed turning from your back to your side while in a flat bed without using bedrails?: None Help needed moving from lying on your back to sitting on the side of a flat bed without using bedrails?: None Help needed moving to and from a bed to a chair (including a wheelchair)?: A Little Help needed standing up from a chair using your arms (e.g., wheelchair or bedside chair)?: A Little Help needed to walk in hospital room?: A Little Help needed climbing 3-5 steps with a railing? : A Lot 6 Click Score: 19    End of Session Equipment Utilized During Treatment: Gait belt Activity Tolerance: Patient tolerated treatment well Patient left: with call bell/phone within reach;with chair alarm set;in chair Nurse Communication: Mobility status PT Visit Diagnosis: Pain;Muscle weakness (generalized) (M62.81);Difficulty in walking, not elsewhere classified (R26.2)     Time: 1010-1034 PT Time Calculation (min) (ACUTE ONLY): 24 min  Charges:  $Gait Training: 8-22 mins $Therapeutic Activity: 8-22 mins                     Royetta Asal, PT Acute Rehab Services Pager [phone] [facility] [phone] [facility] [phone]    Rayetta Humphrey 08/09/2019, 11:41 AM

## 2019-08-09 NOTE — Plan of Care (Signed)

## 2019-08-09 NOTE — Evaluation (Signed)
Clinical/Bedside Swallow Evaluation Patient Details  Name: Catherine Butler MRN: [id number] Date of Birth: 05-04-1961  Today's Date: 08/09/2019 Time: SLP Start Time (ACUTE ONLY): 1115 SLP Stop Time (ACUTE ONLY): 1135 SLP Time Calculation (min) (ACUTE ONLY): 20 min  Past Medical History:  Past Medical History:  Diagnosis Date  . Anxiety   . Arthritis   . Asthma   . Complication of anesthesia    nausea  . COPD (chronic obstructive pulmonary disease) ([place])   . Depression   . Guillain-Barre ([place]) 2018  . H/O alcohol abuse   . Hypertension    Catherine Butler denies  . Nausea & vomiting 07/2019  . PONV (postoperative nausea and vomiting)   . Reflux   . Suicide attempt by multiple drug overdose ([place]) 2014   Past Surgical History:  Past Surgical History:  Procedure Laterality Date  . COLONOSCOPY WITH PROPOFOL N/A 07/21/2017   Procedure: COLONOSCOPY WITH PROPOFOL;  Surgeon: Milus Banister, MD;  Location: [facility];  Service: Endoscopy;  Laterality: N/A;  . LAPAROSCOPY    . laporscopy surgery for ovarian cyst  20 years ago  . TONSILLECTOMY    . TUBAL LIGATION     HPI:  Catherine Butler 59 year old person living with chronic weakness after an episode of AIDP who presented to the emergency departments with intractable nausea and vomiting and admitted to the general medicine service due to dehydration and multiple severe electrolyte abnormalities. CT of the abdomen: Decompressed colon with mucosal hyperenhancement and diffuse wall edema suggestive of colitis. Per EMR, Catherine Butler has reported globus sensation for months.    Assessment / Plan / Recommendation Clinical Impression  Catherine Butler was seen for bedside swallow evaluation. She was alert throughout the evaluation but her reliability as a historian is questioned due to the variability of her reports during the evaluation. It is also noteworthy that she appeared to be hallucinating during the evaluation and she reported seeing babies and subsequently children on the roof of the  hospital. She reported weight loss of 50-60lbs, odynophagia, and globus sensation for the past year. She denied any progression in her symptoms or any alleviating factors. Per, the Catherine Butler she has the most difficulty with meats and therefore does not eat meat often. Oral mechanism exam was [facility] and dentition was limited to reduced mandibular teeth. She tolerated all solids and liquids without signs or symptoms of aspiration but reported odynophagia with solids and globus sensation with all boluses. Catherine Butler's symptoms appear to be esophageal in nature; however, a modified barium swallow study will be conducted to rule out pharyngeal dysfunction.  SLP Visit Diagnosis: Dysphagia, unspecified (R13.10)    Aspiration Risk  No limitations    Diet Recommendation Regular;Thin liquid   Liquid Administration via: Cup;Straw Medication Administration: Whole meds with puree Supervision: Patient able to self feed Compensations: Follow solids with liquid Postural Changes: Seated upright at 90 degrees    Other  Recommendations Oral Care Recommendations: Oral care BID   Follow up Recommendations None      Frequency and Duration min 2x/week  1 week       Prognosis Prognosis for Safe Diet Advancement: Good      Swallow Study   General Date of Onset: 08/08/18 HPI: Catherine Butler 59 year old person living with chronic weakness after an episode of AIDP who presented to the emergency departments with intractable nausea and vomiting and admitted to the general medicine service due to dehydration and multiple severe electrolyte abnormalities. CT of the abdomen: Decompressed colon with mucosal hyperenhancement and diffuse wall  edema suggestive of colitis. Per EMR, Catherine Butler has reported globus sensation for months.  Type of Study: Bedside Swallow Evaluation Previous Swallow Assessment: [place] Swallow Eval in Dec, 2020 was WNL for oropharyngeal swallow function Diet Prior to this Study: Regular;Thin liquids Temperature Spikes Noted:  No Respiratory Status: Room air History of Recent Intubation: No Behavior/Cognition: Alert;Cooperative;Pleasant mood Oral Cavity Assessment: Within Functional Limits Oral Care Completed by SLP: No Oral Cavity - Dentition: Adequate natural dentition Vision: Functional for self-feeding Self-Feeding Abilities: Able to feed self Patient Positioning: Upright in chair;Postural control adequate for testing Baseline Vocal Quality: Normal Volitional Swallow: Able to elicit    Oral/Motor/Sensory Function Overall Oral Motor/Sensory Function: Within functional limits   Ice Chips Ice chips: Within functional limits Presentation: Spoon   Thin Liquid Thin Liquid: Within functional limits(c/o odynophagia) Presentation: Cup;Straw    Nectar Thick Nectar Thick Liquid: Not tested   Honey Thick Honey Thick Liquid: Not tested   Puree Puree: Within functional limits Presentation: Spoon   Solid    Mirranda Monrroy I. [name], MS, CCC-SLP Acute Rehabilitation Services Office number [phone] Pager [phone]  Solid: Impaired Pharyngeal Phase Impairments: Multiple swallows(odynophagia)      [name] 08/09/2019,12:01 PM

## 2019-08-09 NOTE — Progress Notes (Signed)
Occupational Therapy Treatment Patient Details Name: Catherine Butler MRN: [id number] DOB: 1960-06-23 Today's Date: 08/09/2019    History of present illness Pt is a 59 y/o female with PMH signficant for asthma, COPD, AIDP with quadriplegia, urinary retention who presented on 08/05/19 with 3 week history of abdominal pain, nausea and vomiting. Found with severe electrolyte derangements.    OT comments  Patient seated in recliner upon entry.  Completing transfers with min assist, in room mobility with min guard assist using RW, LB ADLs with min assist and grooming at sink with min guard assist.  She fatigues easily and requires increased assist with transfers.  Patient alert and oriented today, but mild confusion noted by making unrelated statements during session (ie reporting "the TV has been broken since the christmas party"). Attempted short blessed test but unable to score due to pt unable to repeat name/address after 3 trials, but noted difficulties with STM, sequencing, attention and problem solving.  Patient reports she can have 24/7 support at home, but if this is not possible may need to look into SNF. At this time patient will need 24/7 support, assist with med mgmt and all IADLS.    Follow Up Recommendations  Home health OT;Supervision/Assistance - 24 hour    Equipment Recommendations  3 in 1 bedside commode    Recommendations for Other Services      Precautions / Restrictions Precautions Precautions: Fall Precaution Comments: watch HR Restrictions Weight Bearing Restrictions: No       Mobility Bed Mobility Overal bed mobility: Needs Assistance Bed Mobility: Supine to Sit     Supine to sit: Min guard     General bed mobility comments: OOB in recliner upon entry   Transfers Overall transfer level: Needs assistance Equipment used: Rolling walker (2 wheeled) Transfers: Sit to/from Stand Sit to Stand: Min assist         General transfer comment: min assist to power up  and steady, cueing for hand placement and safety     Balance Overall balance assessment: Needs assistance Sitting-balance support: No upper extremity supported;Feet supported Sitting balance-Leahy Scale: Fair Sitting balance - Comments: leaning L initially, once feet on floor able to maintain but with close guard   Standing balance support: Bilateral upper extremity supported;During functional activity;No upper extremity supported Standing balance-Leahy Scale: Poor Standing balance comment: able to maintain static standing at sink during grooming with min guard, but dynamically relaint on BUE and external support                           ADL either performed or assessed with clinical judgement   ADL Overall ADL's : Needs assistance/impaired     Grooming: Min guard;Standing;Oral care;Brushing hair               Lower Body Dressing: Minimal assistance;Sit to/from stand   Toilet Transfer: Minimal assistance;Ambulation;RW Toilet Transfer Details (indicate cue type and reason): simulated in room         Functional mobility during ADLs: Minimal assistance;Rolling walker;Cueing for safety;Cueing for sequencing General ADL Comments: pt limited by cognition, impaired balance, decreased activity tolerance     Vision       Perception     Praxis      Cognition Arousal/Alertness: Awake/alert Behavior During Therapy: WFL for tasks assessed/performed Overall Cognitive Status: No family/caregiver present to determine baseline cognitive functioning Area of Impairment: Following commands;Attention;Memory;Safety/judgement;Awareness;Problem solving  Current Attention Level: Sustained Memory: Decreased recall of precautions;Decreased short-term memory Following Commands: Follows one step commands consistently;Follows one step commands with increased time;Follows multi-step commands inconsistently Safety/Judgement: Decreased awareness of  safety;Decreased awareness of deficits Awareness: Emergent Problem Solving: Slow processing;Difficulty sequencing;Requires verbal cues General Comments: mild confusion, patient alert and oriented but occasionaly making out of context statements.  Attempted short blessed test but patient unable to repeat name/address correctly after 3 trials (but deficits noted in sequencing, attention and memory)         Exercises     Shoulder Instructions       General Comments HR max 120     Pertinent Vitals/ Pain       Pain Assessment: No/denies pain  Home Living                                          Prior Functioning/Environment              Frequency  Min 2X/week        Progress Toward Goals  OT Goals(current goals can now be found in the care plan section)  Progress towards OT goals: Progressing toward goals  Acute Rehab OT Goals Patient Stated Goal: to get home  OT Goal Formulation: With patient  Plan Discharge plan remains appropriate;Frequency remains appropriate    Co-evaluation                 AM-PAC OT "6 Clicks" Daily Activity     Outcome Measure   Help from another person eating meals?: A Little Help from another person taking care of personal grooming?: A Little Help from another person toileting, which includes using toliet, bedpan, or urinal?: A Little Help from another person bathing (including washing, rinsing, drying)?: A Little Help from another person to put on and taking off regular upper body clothing?: A Little Help from another person to put on and taking off regular lower body clothing?: A Little 6 Click Score: 18    End of Session Equipment Utilized During Treatment: Rolling walker;Gait belt  OT Visit Diagnosis: Other abnormalities of gait and mobility (R26.89);Muscle weakness (generalized) (M62.81);Pain   Activity Tolerance Patient limited by fatigue   Patient Left with call bell/phone within reach;in chair;with  chair alarm set   Nurse Communication Mobility status        Time: 2706-2376 OT Time Calculation (min): 24 min  Charges: OT General Charges $OT Visit: 1 Visit OT Treatments $Self Care/Home Management : 23-37 mins  [place] Pager [phone] Office [phone]    Delight Stare 08/09/2019, 1:39 PM

## 2019-08-09 NOTE — Progress Notes (Signed)
  Subjective:  States that she has a feeling of something being stuck in her throat for a few months now. Patient states her nausea/vomiting have improved.  Objective:    Vital Signs (last 24 hours): Vitals:   08/08/19 1201 08/08/19 2037 08/09/19 0003 08/09/19 0408  BP: 107/80 108/76 110/82 114/80  Pulse: 98 (!) 110 (!) 102 100  Resp:  20 20 19   Temp: 97.8 F (36.6 C) 98.3 F (36.8 C) (!) 97.5 F (36.4 C)   TempSrc:  Oral Oral   SpO2:  100% 97% 100%  Weight:    59.4 kg    Physical Exam: General Alert and answers questions appropriately, no acute distress  Cardiac Regular rate and rhythm, no murmurs, rubs, or gallops  Abdominal Soft, non-tender, without distention    BMP Latest Ref Rng & Units 08/09/2019 08/08/2019 08/07/2019  Glucose 70 - 99 mg/dL 08/09/2019) 95 443(X)  BUN 6 - 20 mg/dL 5(L) 6 540(G)  Creatinine 0.44 - 1.00 mg/dL <8(Q 7.61 9.50  BUN/Creat Ratio 9 - 23 - - -  Sodium 135 - 145 mmol/L 138 137 137  Potassium 3.5 - 5.1 mmol/L 3.8 3.3(L) 3.3(L)  Chloride 98 - 111 mmol/L 109 109 110  CO2 22 - 32 mmol/L 21(L) 18(L) 18(L)  Calcium 8.9 - 10.3 mg/dL 7.3(L) 7.1(L) 6.9(L)   Magnesium: 2.0  Assessment/Plan:   Principal Problem:   Dehydration Active Problems:   Hypokalemia   Hypomagnesemia   Protein-calorie malnutrition, severe   High anion gap metabolic acidosis   Intractable nausea and vomiting   Patient is a 59 year old female with past medical history significant for asthma, COPD, AIDP with quadriplegia, urinary retention who presented on 08/05/2019 with a 3-week history of abdominal pain, nausea, and vomiting.  # Hypokalemia # Hypomagnesemia # Intractable nausea/vomiting Patient presented with a 3-week history of persistent nausea and vomiting with severe electrolyte derangements on presentation.  Today patient with potassium 3.8, magnesium 2.0, corrected calcium 9.5.  Patient received as needed Phenergan PO x1 yesterday, has not required any pain medication in  past 24 hours. QTc of 490 msec ([place]az[place]) yesterday. * Phenergan 25 mg syrup Q6HR PRN  # Urinary retention: Continue to intermittent in&out cath. 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 Vitamin B12 elevated, Folate pending. Anemia likely secondary to nutritional deficiency  PT/OT: HH PT/OT and DME 3 N 1 have been ordered Diet: On soft diet, advance as tolerated DVT Ppx: Lovenox 40 mg daily Dispo: Anticipated discharge in approximately 0-1 days  12-12, MD 08/09/2019, 6:48 AM

## 2019-08-09 NOTE — Progress Notes (Signed)
Paged and notified the physician in regards to patient's mental orientation. Patient is alert and orientation x 4 however patient's has mild confusion during each conversation during each rounding. Patient states she asked "why are there babies running around on the ceiling" or why has her son hasn't came to visit her. MD paged and is aware. Will continue to monitor.

## 2019-08-10 ENCOUNTER — Inpatient Hospital Stay (HOSPITAL_COMMUNITY): Payer: Medicaid Other

## 2019-08-10 DIAGNOSIS — F0390 Unspecified dementia without behavioral disturbance: Secondary | ICD-10-CM

## 2019-08-10 LAB — CBC
HCT: 31.2 % — ABNORMAL LOW (ref 36.0–46.0)
Hemoglobin: 10.1 g/dL — ABNORMAL LOW (ref 12.0–15.0)
MCH: 33.4 pg (ref 26.0–34.0)
MCHC: 32.4 g/dL (ref 30.0–36.0)
MCV: 103.3 fL — ABNORMAL HIGH (ref 80.0–100.0)
Platelets: 122 10*3/uL — ABNORMAL LOW (ref 150–400)
RBC: 3.02 MIL/uL — ABNORMAL LOW (ref 3.87–5.11)
RDW: 15.4 % (ref 11.5–15.5)
WBC: 4 10*3/uL (ref 4.0–10.5)
nRBC: 0 % (ref 0.0–0.2)

## 2019-08-10 LAB — RENAL FUNCTION PANEL
Albumin: 1.3 g/dL — ABNORMAL LOW (ref 3.5–5.0)
Anion gap: 10 (ref 5–15)
BUN: 5 mg/dL — ABNORMAL LOW (ref 6–20)
CO2: 22 mmol/L (ref 22–32)
Calcium: 7.7 mg/dL — ABNORMAL LOW (ref 8.9–10.3)
Chloride: 108 mmol/L (ref 98–111)
Creatinine, Ser: 0.71 mg/dL (ref 0.44–1.00)
GFR calc Af Amer: >60 mL/min (ref >60)
GFR calc non Af Amer: >60 mL/min (ref >60)
Glucose, Bld: 107 mg/dL — ABNORMAL HIGH (ref 70–99)
Phosphorus: 2.6 mg/dL (ref 2.5–4.6)
Potassium: 3.6 mmol/L (ref 3.5–5.1)
Sodium: 140 mmol/L (ref 135–145)

## 2019-08-10 LAB — FOLATE RBC
Folate, Hemolysate: 268 ng/mL
Folate, RBC: 915 ng/mL (ref >498)
Hematocrit: 29.3 % — ABNORMAL LOW (ref 34.0–46.6)

## 2019-08-10 LAB — MAGNESIUM: Magnesium: 1.5 mg/dL — ABNORMAL LOW (ref 1.7–2.4)

## 2019-08-10 LAB — PROTIME-INR
INR: 1 (ref 0.8–1.2)
Prothrombin Time: 13 seconds (ref 11.4–15.2)

## 2019-08-10 MED ORDER — ADULT MULTIVITAMIN W/MINERALS CH: 1.0000 | ORAL_TABLET | Freq: Every day | ORAL | 0 refills | Status: AC

## 2019-08-10 MED ORDER — MAGNESIUM SULFATE 4 GM/100ML IV SOLN
4.0000 g | Freq: Once | INTRAVENOUS | Status: AC
  Administered 2019-08-10: 4 g via INTRAVENOUS
  Filled 2019-08-10: qty 100

## 2019-08-10 MED ORDER — PROMETHAZINE HCL 6.25 MG/5ML PO SYRP: 25.0000 mg | ORAL_SOLUTION | Freq: Four times a day (QID) | ORAL | 0 refills | Status: AC | PRN

## 2019-08-10 MED ORDER — ACETAMINOPHEN 500 MG PO TABS: 500.0000 mg | ORAL_TABLET | ORAL | 0 refills | Status: AC | PRN

## 2019-08-10 MED ORDER — ENSURE ENLIVE PO LIQD: 237.0000 mL | Freq: Three times a day (TID) | ORAL | 0 refills | Status: AC

## 2019-08-10 MED FILL — PROMETHAZINE 6.25 MG/5 ML S: 6.25 | 3 days supply | Qty: 240 | Fill #0

## 2019-08-10 NOTE — Progress Notes (Signed)
  Subjective:  Patient seen at bedside. Patient states that her nausea and vomiting have improved.  Patient states she is able to eat food.  States that when eating solid food it sometimes feels like it temporarily get stuck in her throat.  Patient states she feels comfortable going home at this time.  Patient counseled on plan of care, all questions answered.  Objective:    Vital Signs (last 24 hours): Vitals:   08/09/19 2041 08/09/19 2358 08/10/19 0454 08/10/19 0500  BP: 111/79 94/70 (!) 124/91   Pulse: (!) 102 98 (!) 168 (!) 105  Resp: 18 14 19    Temp: 98.1 F (36.7 C) (!) 97.4 F (36.3 C) 98.1 F (36.7 C)   TempSrc: Oral Oral Oral   SpO2: 99% 99% (!) 86%   Weight:   59.4 kg     Physical Exam: General Alert and answers questions appropriately, no acute distress  Cardiac Regular rate and rhythm, no murmurs, rubs, or gallops  Abdominal Soft, non-tender, without distention  Extremities No peripheral edema   BMP Latest Ref Rng & Units 08/10/2019 08/09/2019 08/08/2019  Glucose 70 - 99 mg/dL 08/10/2019) 188(C) 95  BUN 6 - 20 mg/dL 5(L) 5(L) 6  Creatinine 0.44 - 1.00 mg/dL 166(A 6.30 1.60  BUN/Creat Ratio 9 - 23 - - -  Sodium 135 - 145 mmol/L 140 138 137  Potassium 3.5 - 5.1 mmol/L 3.6 3.8 3.3(L)  Chloride 98 - 111 mmol/L 108 109 109  CO2 22 - 32 mmol/L 22 21(L) 18(L)  Calcium 8.9 - 10.3 mg/dL 7.7(L) 7.3(L) 7.1(L)   Magnesium: 1.5   Assessment/Plan:   Principal Problem:   Dehydration Active Problems:   Hypokalemia   Hypomagnesemia   Protein-calorie malnutrition, severe   High anion gap metabolic acidosis   Intractable nausea and vomiting  Patient is a 59 year old female with past medical history significant for asthma, COPD, AIDP with quadriplegia, urinary retention who presented on 08/05/2019 with a 3-week history of abdominal pain, nausea, and vomiting.  # Hypokalemia # Hypomagnesemia # Intractable nausea/vomiting Patient presented with a 3-week history of persistent  nausea and vomiting with severe electrolyte derangements on presentation. Today patient with potassium 3.6, magnesium of 1.5, corrected calcium of 9.9. Will replete magnesium. Patient has not required pain medications or anti-emetics in past 24 hours. Patient to be discharged with Phenergan 25 mg syrup Q6HR PRN  # Urinary retention: Continue to intermittent in and out cath.  Urine culture demonstrated greater than >100,000 colonies GNR but patient is asymptomatic and likely chronically colonized  # Macrocytic anemia: Hemoglobin 10-12 during admission. Vitamin B12 elevated, foalte pending  # Elevated Alkaline phosphatase: Chronically elevated, no biliary duct dilation on last ultrasound, AMA pending  PT/OT: HH PT/OT and DME 3 N 1 have been ordered Diet: On soft diet, advance as tolerated DVT Ppx: Lovenox 40 mg daily Admit Status: Inpatient Dispo: Anticipated discharge today  12-12, MD 08/10/2019, 7:25 AM

## 2019-08-10 NOTE — TOC Progression Note (Signed)
Transition of Care [facility]) - Progression Note    Patient Details  Name: Catherine Butler MRN: [id number] Date of Birth: 1960-08-18  Transition of Care [facility]) CM/SW Contact  Leone Haven, RN Phone Number: 08/10/2019, 12:07 PM  Clinical Narrative:    NCM notified Kandee Keen with [facility] that patient is for dc today.     Expected Discharge Plan: Home w Home Health Services Barriers to Discharge: No Barriers Identified  Expected Discharge Plan and Services Expected Discharge Plan: Home w Home Health Services In-house Referral: NA Discharge Planning Services: CM Consult Post Acute Care Choice: Home Health Living arrangements for the past 2 months: Single Family Home Expected Discharge Date: 08/10/19                         HH Arranged: PT, OT HH Agency: [facility] Date [facility] Agency Contacted: 08/08/19 Time HH Agency Contacted: [phone] Representative spoke with at [facility] Agency: Kandee Keen   Social Determinants of Health (SDOH) Interventions    Readmission Risk Interventions No flowsheet data found.

## 2019-08-10 NOTE — Discharge Summary (Addendum)
Name: Catherine Butler MRN: [id number] DOB: Nov 12, 1960 59 y.o. PCP: Kerin Perna, NP  Date of Admission: 08/05/2019 12:16 PM Date of Discharge:  08/10/2019 Attending Physician: Oda Kilts, MD  Discharge Diagnosis: 1. Intractable nausea and vomiting 2. Hypokalemia 3. Hypomagnesemia 4. Urinary retention 5. Macrocytic anemia 6. 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 What changed:  how much to take when to take this   albuterol 108 (90 Base) MCG/ACT inhaler Commonly known as: VENTOLIN HFA Inhale 2 puffs into the lungs every 6 (six) hours as needed for wheezing.   albuterol (2.5 MG/3ML) 0.083% nebulizer solution Commonly known as: PROVENTIL Take 3 mLs (2.5 mg total) by nebulization every 6 (six) hours as needed for wheezing or shortness of breath.   aspirin EC 81 MG tablet Take 1 tablet (81 mg total) by mouth daily.   DULoxetine 30 MG capsule Commonly known as: CYMBALTA Take 1 capsule (30 mg total) by mouth daily.   esomeprazole 40 MG capsule Commonly known as: NEXIUM TAKE 1 CAPSULE BY MOUTH ONCE DAILY BEFORE BREAKFAST What changed: See the new instructions.   feeding supplement (ENSURE ENLIVE) Liqd Take 237 mLs by mouth 3 (three) times daily between meals.   gabapentin 600 MG tablet Commonly known as: NEURONTIN Take 2 tablets by mouth three times a day What changed:  how much to take how to take this when to take this additional instructions   ibuprofen 200 MG tablet Commonly known as: ADVIL Take 400 mg by mouth every 6 (six) hours as needed for fever, headache, mild pain, moderate pain or cramping.   Linzess 145 MCG Caps capsule Generic drug: linaclotide TAKE 1 CAPSULE BY MOUTH ONCE DAILY BEFORE BREAKFAST. TAKE ON AN EMPTY STOMACH. What changed: See the new instructions.   mirtazapine 15 MG tablet Commonly known as: REMERON Take 15 mg by mouth at bedtime.   multivitamin with minerals Tabs tablet Take 1 tablet by mouth daily. Start taking on: August 11, 2019   promethazine 6.25 MG/5ML syrup Commonly known as: PHENERGAN Take 20 mLs (25 mg total) by mouth every 6 (six) hours as needed for nausea or vomiting.   QUEtiapine 400 MG tablet Commonly known as: SEROQUEL Take 400 mg by mouth at bedtime.   triamcinolone 0.1 % paste Commonly known as: KENALOG Use as directed 1 application in the mouth or throat 2 (two) times daily.               Durable Medical Equipment  (From admission, onward)           Start     Ordered   08/08/19 [phone]  For home use only DME 3 n 1  Once     08/08/19 3154            Disposition and follow-up:   Catherine Butler was discharged from [facility] in Stable condition.  At the  hospital follow up visit please address:  1.  Please assess patient for resolution of nausea/vomiting. Please check patient's electrolytes.  Patient should followup with gastroenterology provider for evaluation of globus sensation when swallowing.  Please consider titrating patient off of seroquel or one of her other centrally-acting medications as patient had confusion during hospitalization.  Patient will need followup with urology for urinary retention.  2.  Labs / imaging needed at time of follow-up: CMP, Magnesium  3.  Pending labs/ test needing follow-up: Anti-mitochondrial antibody, Folate RBC  Follow-up Appointments: Follow-up Information     Care, [facility] Follow up.   Specialty: Home Health Services Why: Physical and Occupational Therapies-office to call with visit times.  Contact information: [address] [place] [address] [phone]         Grayce Sessions, NP. Go on 08/30/2019.   Specialty: Internal Medicine Why: Appointment is at 230 pm. Please call for an earlier appointment. At appointment ask about stopping seroquel or remeron as these may be contributing to your confusion Contact information: [address] [place]  [address] [phone]         Meredith Pel, NP Follow up.   Specialty: Gastroenterology Why: Go to appointment on 2019-09-04 at [facility] information: [address] [place] [address] [phone]            Hospital Course by problem list:  # Hypokalemia # Hypomagnesemia # Intractable nausea/vomiting Patient presented with a 3-week history of persistent nausea and vomiting with severe electrolyte derangements.  On presentation, potassium of 2.4, magnesium 0.5, corrected calcium is 7.7.  CT abdomen/pelvis was without signs of obstruction.  While there was evidence for colitis on CT, the absence of significant diarrhea, fever, or elevated WBC made infectious colitis less likely.  Patient was treated with supportive care and improved significantly during hospitalization.  Patient did have mildly prolonged QTC which was monitored when providing antiemetic agents.  Patient was discharged with Phenergan 25 mg syrup every 6 hour as needed.  # Urinary retention: Patient reports self-catheterization for 3 weeks prior to hospitalization.  She follows with urology.  During hospitalization, patient was managed with every 6 hours bladder scans + in and out catheterization for volumes greater than 300 mL.  # Dementia: # Depression/Anxiety: Patient was continued on home medications of duloxetine, mirtazapine, and quetiapine.  Mild confusion noted during  hospitalization, which is baseline per son.  Patient may benefit from discontinuation of quetiapine or one of her other centrally acting medications.  Doses will need to be gradually decreased, will defer to PCP for management.  # ALP Elevation: ALP elevated to 197, elevation is chronic.  Viral hepatitis labs in August 2018 were negative - hep A antibody IgM, hep B surface antigen, hep B core antibody IgM, hep C virus antibody.  Patient had a right upper quadrant ultrasound performed in December 2020 which did not show common bile duct elation.  Antimitochondrial antibody study was collected, is pending.  # Macrocytic anemia: Hemoglobin 10-12 during admission, MCV 104. Vitamin B12 elevated, folate RBC level is pending at discharge.  Discharge Vitals:   BP (!) 110/49 (BP Location: Left Arm)   Pulse (!) 106   Temp 98.2 F (36.8 C) (Oral)   Resp 16   Wt 59.4 kg   LMP 02/22/2006   SpO2 99%   BMI 21.13 kg/m   Pertinent Labs, Studies, and Procedures:  CBC Latest Ref Rng & Units 08/10/2019 08/08/2019 08/06/2019  WBC  4.0 - 10.5 K/uL 4.0 4.2 9.3  Hemoglobin 12.0 - 15.0 g/dL 10.1(L) 10.9(L) 11.7(L)  Hematocrit 36.0 - 46.0 % 31.2(L) 34.3(L) 35.3(L)  Platelets 150 - 400 K/uL 122(L) 98(L) 166   BMP Latest Ref Rng & Units 08/10/2019 08/09/2019 08/08/2019  Glucose 70 - 99 mg/dL 287(O) 676(H) 95  BUN 6 - 20 mg/dL 5(L) 5(L) 6  Creatinine 0.44 - 1.00 mg/dL 2.09 4.70 9.62  BUN/Creat Ratio 9 - 23 - - -  Sodium 135 - 145 mmol/L 140 138 137  Potassium 3.5 - 5.1 mmol/L 3.6 3.8 3.3(L)  Chloride 98 - 111 mmol/L 108 109 109  CO2 22 - 32 mmol/L 22 21(L) 18(L)  Calcium 8.9 - 10.3 mg/dL 7.7(L) 7.3(L) 7.1(L)   CT Abdomen/Pelvis W Contrast (08/05/2019): IMPRESSION: Decompressed colon with mucosal hyperenhancement and diffuse wall edema suggestive of colitis.   Mild free fluid likely reactive in nature.   No renal calculi are seen.   Fatty liver.  DG Swallow Func Speech Path (08/10/2019): Pt presents with  functional oropharyngeal swallow with adequate mastication, swallow that triggers between valleculae and pyriforms, occasional penetration of thin liquids that is ejected from laryngeal vestibule upon laryngeal closure (PAS#2, considered normal).  There was no aspiration and good pharyngeal clearance of barium.  Intermittent scan of esophagus revealed retention of solid barium; this was cleared with liquid wash.  Pt was unable to swallow a 13 mm barium pill whole and expectorated it. There is no oropharyngeal dysphagia; pt may benefit from esophageal w/u considering ongling c/o globus.  Discussed basic recs to cope with esophageal deficits.  No further SLP f/u is warranted.      SLP Visit Diagnosis Dysphagia, unspecified (R13.10)     Discharge Instructions: Discharge Instructions     Call MD for:  extreme fatigue   Complete by: As directed    Call MD for:  persistant nausea and vomiting   Complete by: As directed    Call MD for:  redness, tenderness, or signs of infection (pain, swelling, redness, odor or green/yellow discharge around incision site)   Complete by: As directed    Call MD for:  severe uncontrolled pain   Complete by: As directed    Diet - low sodium heart healthy   Complete by: As directed    Discharge instructions   Complete by: As directed    You were seen in the hospital for nausea and vomiting. Your symptoms have improved during the hospitalization. We have sent you home with a liquid nausea medicine that should help with your symptoms. It is important that you get adequate nutrition. Please take Ensures three times a day with meals to help supplement your nutrition. We also want you to take a daily multivitamin.  At your followup appointment with your primary care provider, please ask about coming off of your seroquel or remeron as these medications may be causing confusion. Regarding food getting stuck in your throat, we recommend you followup with your gastrointestinal  doctor to further evaluate this.  Thank you for allowing [place] to be part of your medical care!   Increase activity slowly   Complete by: As directed        Signed: Katherine Roan, MD 08/10/2019, 2:19 PM

## 2019-08-10 NOTE — Progress Notes (Signed)
Occupational Therapy Treatment Patient Details Name: Catherine Butler MRN: [id number] DOB: May 22, 1961 Today's Date: 08/10/2019    History of present illness Pt is a 59 y/o female with PMH signficant for asthma, COPD, AIDP with quadriplegia, urinary retention who presented on 08/05/19 with 3 week history of abdominal pain, nausea and vomiting. Found with severe electrolyte derangements.    OT comments  Patient supine in bed and agreeable to OT.  Patient continues to demonstrate increased confusion today, reports only being here for about 1 1/2 days, unable to tell therapist where she was until OT says "look around, what does this place look like" and able to voice "hospital", poor awareness to situation.  Engaged in clock drawing, where patient is unable to place numbers in clock correctly over 2 trials-- listing numbers 12-8, but then jumping to 16-20 with no awareness of mistakes.  Patient completing transfers, grooming and mobility with min assist using RW; cueing for safety and awareness.  Patient inconsistent with report of support at home, but based on performance today and cognition recommend 24/7 assist, hands on assist with all ADls and mobility and assistance with IADLs at this time.  Will follow acutely.    Follow Up Recommendations  Home health OT;Supervision/Assistance - 24 hour;Other (comment)(HH aide )    Equipment Recommendations  3 in 1 bedside commode    Recommendations for Other Services Speech consult    Precautions / Restrictions Precautions Precautions: Fall Precaution Comments: watch HR Restrictions Weight Bearing Restrictions: No       Mobility Bed Mobility Overal bed mobility: Needs Assistance Bed Mobility: Supine to Sit;Sit to Supine     Supine to sit: Min assist Sit to supine: Supervision   General bed mobility comments: min assist to bring trunk up and scoot foward, heavy L lateral lean until positioned; returned to supine with supervision    Transfers Overall transfer level: Needs assistance Equipment used: Rolling walker (2 wheeled) Transfers: Sit to/from Stand Sit to Stand: Min assist         General transfer comment: min assist to power up and steady, cueing for hand placement and safety     Balance Overall balance assessment: Needs assistance Sitting-balance support: No upper extremity supported;Feet supported Sitting balance-Leahy Scale: Fair Sitting balance - Comments: L lean initally but once repositioned with feet on floor able to maintain with close guard    Standing balance support: Bilateral upper extremity supported;During functional activity;No upper extremity supported Standing balance-Leahy Scale: Poor Standing balance comment: able to maintain static standing at sink during grooming with min guard, but dynamically relaint on BUE and external support                           ADL either performed or assessed with clinical judgement   ADL Overall ADL's : Needs assistance/impaired     Grooming: Min guard;Standing;Oral care;Brushing hair               Lower Body Dressing: Minimal assistance;Sit to/from stand   Toilet Transfer: Minimal assistance;Ambulation;RW Toilet Transfer Details (indicate cue type and reason): simulated in room         Functional mobility during ADLs: Minimal assistance;Rolling walker;Cueing for safety;Cueing for sequencing General ADL Comments: pt limited by cognition, impaired balance, decreased activity tolerance     Vision       Perception     Praxis      Cognition Arousal/Alertness: Awake/alert Behavior During Therapy: WFL for tasks assessed/performed  Overall Cognitive Status: No family/caregiver present to determine baseline cognitive functioning Area of Impairment: Orientation;Attention;Memory;Following commands;Safety/judgement;Awareness;Problem solving                 Orientation Level: Disoriented to;Time;Situation Current  Attention Level: Focused Memory: Decreased recall of precautions;Decreased short-term memory Following Commands: Follows one step commands consistently;Follows one step commands with increased time;Follows multi-step commands inconsistently Safety/Judgement: Decreased awareness of safety;Decreased awareness of deficits Awareness: Intellectual Problem Solving: Slow processing;Difficulty sequencing;Requires verbal cues;Decreased initiation General Comments: patient oriented to year and month (self corrected from march to feb when questioned), but poor awareness to situation and time-- patient reports only being here for 1 1/2 days, unable to voice where she was but able to say hospital when therapist asked her to look around to figure it out.  Engaged in clock drawing and patient unable to successfully complete placing numbers in clock (2 trials provided) writing numbers 12-8 and then jumping to 16-20, unable to recall time to put hands towards, and poor attention to task.  Patient agreeable to difficulties with problem sovling and recall today.  RN notified.         Exercises     Shoulder Instructions       General Comments HR max 126    Pertinent Vitals/ Pain       Pain Assessment: No/denies pain  Home Living                                          Prior Functioning/Environment              Frequency  Min 2X/week        Progress Toward Goals  OT Goals(current goals can now be found in the care plan section)  Progress towards OT goals: Progressing toward goals  Acute Rehab OT Goals Patient Stated Goal: to get home  OT Goal Formulation: With patient  Plan Discharge plan remains appropriate;Frequency remains appropriate    Co-evaluation                 AM-PAC OT "6 Clicks" Daily Activity     Outcome Measure   Help from another person eating meals?: A Little Help from another person taking care of personal grooming?: A Little Help from  another person toileting, which includes using toliet, bedpan, or urinal?: A Little Help from another person bathing (including washing, rinsing, drying)?: A Little Help from another person to put on and taking off regular upper body clothing?: A Little Help from another person to put on and taking off regular lower body clothing?: A Little 6 Click Score: 18    End of Session Equipment Utilized During Treatment: Rolling walker;Gait belt  OT Visit Diagnosis: Other abnormalities of gait and mobility (R26.89);Muscle weakness (generalized) (M62.81);Pain;Other symptoms and signs involving cognitive function   Activity Tolerance Patient tolerated treatment well   Patient Left with call bell/phone within reach;in bed;with bed alarm set;with nursing/sitter in room   Nurse Communication Mobility status        Time: 7829-5621 OT Time Calculation (min): 25 min  Charges: OT General Charges $OT Visit: 1 Visit OT Treatments $Self Care/Home Management : 8-22 mins $Cognitive Funtion inital: Initial 15 mins  Barry Brunner, OT Acute Rehabilitation Services Pager [phone] Office [phone]    Chancy Milroy 08/10/2019, 12:08 PM

## 2019-08-10 NOTE — Progress Notes (Signed)
Patient has increased forgetfulness and confusion due to this concerns for discharged. MD internal medicine Ronelle Nigh notified.

## 2019-08-10 NOTE — Progress Notes (Signed)
MD Ronelle Nigh at bedside to assess patient increased forgetfulness and confusion.

## 2019-08-10 NOTE — Progress Notes (Signed)
Modified Barium Swallow Progress Note  Patient Details  Name: Catherine Butler MRN: [id number] Date of Birth: 05-02-1961  Today's Date: 08/10/2019  Modified Barium Swallow completed.  Full report located under Chart Review in the Imaging Section.  Brief recommendations include the following:  Clinical Impression  Pt presents with functional oropharyngeal swallow with adequate mastication, swallow that triggers between valleculae and pyriforms, occasional penetration of thin liquids that is ejected from laryngeal vestibule upon laryngeal closure (PAS#2, considered normal).  There was no aspiration and good pharyngeal clearance of barium.  Intermittent scan of esophagus revealed retention of solid barium; this was cleared with liquid wash.  Pt was unable to swallow a 13 mm barium pill whole and expectorated it. There is no oropharyngeal dysphagia; pt may benefit from esophageal w/u considering ongling c/o globus.  Discussed basic recs to cope with esophageal deficits.  No further SLP f/u is warranted.      Swallow Evaluation Recommendations   Recommended Consults: Consider esophageal assessment   SLP Diet Recommendations: Dysphagia 3 (Mech soft) solids;Thin liquid   Liquid Administration via: Cup;Straw   Medication Administration: Whole meds with liquid   Supervision: Patient able to self feed   Compensations: Follow solids with liquid   Postural Changes: Remain semi-upright after after feeds/meals (Comment)          Koralee Wedeking L. Samson Frederic, MA CCC/SLP Acute Rehabilitation Services Office number [phone] Pager [phone]   [name] 08/10/2019,9:30 AM

## 2019-08-12 LAB — MITOCHONDRIAL ANTIBODIES: Mitochondrial M2 Ab, IgG: <20 Units (ref 0.0–20.0)

## 2019-08-13 ENCOUNTER — Telehealth: Payer: Self-pay

## 2019-08-13 ENCOUNTER — Telehealth (INDEPENDENT_AMBULATORY_CARE_PROVIDER_SITE_OTHER): Payer: Self-pay

## 2019-08-13 NOTE — Telephone Encounter (Signed)
Transition Care Management Follow-up Telephone Call  Call returned to patient's son, Vonna Kotyk.  DPR on file to speak with him. This information was collected from him    Date of discharge and from where: 08/10/2019, [facility]   How have you been since you were released from the hospital?  Her son said his mother fell earlier  today.  He has family with her while he is at work. He was not sure if she hit her head. He was interested in hiring someone privately to sit with her while he is at work. He is with her at night.  Provided him with the phone number for [facility] Lemmings to inquire about private pay services as they have been referred for home PT/OT.  He was not sure if Alvis Lemmings called her today about therapy.  Any questions or concerns? Noted above. Her son is concerned about her safety at home.    Items Reviewed:  Did the pt receive and understand the discharge instructions provided? Her son said that he needs to review the instructions  Does the patient have all medications? He said that his mother is trying to manage the medications herself but he is not sure how well she is doing this.  He needs to review the instructions and said he will discuss with her provider.  Instructed him to call this CM with any questions   Any new allergies since your discharge? None reported   Do you have support at home? Her son is living with her now and family checks on her during the day when he is working   Other (ie: DME, Home Health, etc) home PT/OT ordered through [facility].  Asked her son to inquire about the status of this referral when he calls to inquire about private pay services.   He said that his mother has been using the bathroom.  He is not aware that she has been self -catheterizing. He said that she was given the cath supplies when she was discharged.   Discussed the option of PCS. He said that his mother would be interested.  This CM to check with her to confirm she would accept the  services. This CM then called the house again to speak to the patient about PCS and she was asleep. Caryl Pina ( Joshua's mother) answered and will ask the patient to call this CM back regarding PCS.Caryl Pina said that she came to stay with patient after she ( patient) called her stating that she fell and was on the floor for hours.  Caryl Pina was not sure if the patient hit her head when she fell.  She explained that the patient needs assistance when transferring.   She has RW, Wheelchair and nebulizer. He was not sure if she has received the 3:1 commode.    Functional Questionnaire: (I = Independent and D = Dependent) ADL's;   Needs assistance. Her son/family has been providing assistance when they can.     Follow up appointments reviewed:    PCP appointment?  Appointment scheduled for 08/20/2019 @ 1050 with Ms Oletta Lamas, [facility] f/u appt confirmed? GI appointment 07/30/2019.   Are transportation arrangements needed? her son provides transportation.   If their condition worsens, is the pt aware to call  their PCP or go to the ED?   Yes, her son is aware  Was the patient provided with contact information for the PCP's office or ED?  her son has the phone number for the clinic?  Was the  pt encouraged to call back with questions or concerns?  Yes, her son was instructed to call back with any questions.

## 2019-08-13 NOTE — Telephone Encounter (Signed)
Transition Care Management Follow-up Telephone Call Date of discharge and from where: 08/10/2019,[facility].  Call placed to patient x3 and there was a lot of static, unable to hear patient.   Call placed again to # [phone], message left with call back requested to this CM.

## 2019-08-13 NOTE — Telephone Encounter (Signed)
Patient called wanting to get information on how to obtain a nurse aid for his mother.  Please advice patient son Catherine Butler [phone]

## 2019-08-14 ENCOUNTER — Other Ambulatory Visit: Payer: Self-pay

## 2019-08-14 ENCOUNTER — Ambulatory Visit: Payer: Medicaid Other | Admitting: Nurse Practitioner

## 2019-08-14 ENCOUNTER — Encounter (HOSPITAL_COMMUNITY): Payer: Self-pay | Admitting: Emergency Medicine

## 2019-08-14 ENCOUNTER — Other Ambulatory Visit: Payer: Self-pay | Admitting: Physician Assistant

## 2019-08-14 ENCOUNTER — Inpatient Hospital Stay (HOSPITAL_COMMUNITY)
Admission: EM | Admit: 2019-08-14 | Discharge: 2019-09-20 | DRG: 441 | Disposition: E | Payer: Medicaid Other | Attending: Internal Medicine | Admitting: Internal Medicine

## 2019-08-14 DIAGNOSIS — Z800 Family history of malignant neoplasm of digestive organs: Secondary | ICD-10-CM

## 2019-08-14 DIAGNOSIS — Z888 Allergy status to other drugs, medicaments and biological substances status: Secondary | ICD-10-CM

## 2019-08-14 DIAGNOSIS — Z7982 Long term (current) use of aspirin: Secondary | ICD-10-CM

## 2019-08-14 DIAGNOSIS — K5900 Constipation, unspecified: Secondary | ICD-10-CM | POA: Diagnosis present

## 2019-08-14 DIAGNOSIS — Z841 Family history of disorders of kidney and ureter: Secondary | ICD-10-CM

## 2019-08-14 DIAGNOSIS — Z681 Body mass index (BMI) 19 or less, adult: Secondary | ICD-10-CM

## 2019-08-14 DIAGNOSIS — K652 Spontaneous bacterial peritonitis: Secondary | ICD-10-CM | POA: Diagnosis present

## 2019-08-14 DIAGNOSIS — E8809 Other disorders of plasma-protein metabolism, not elsewhere classified: Secondary | ICD-10-CM | POA: Diagnosis present

## 2019-08-14 DIAGNOSIS — Z66 Do not resuscitate: Secondary | ICD-10-CM | POA: Diagnosis not present

## 2019-08-14 DIAGNOSIS — K2100 Gastro-esophageal reflux disease with esophagitis, without bleeding: Secondary | ICD-10-CM | POA: Diagnosis present

## 2019-08-14 DIAGNOSIS — E43 Unspecified severe protein-calorie malnutrition: Secondary | ICD-10-CM | POA: Diagnosis present

## 2019-08-14 DIAGNOSIS — Z833 Family history of diabetes mellitus: Secondary | ICD-10-CM

## 2019-08-14 DIAGNOSIS — K7460 Unspecified cirrhosis of liver: Secondary | ICD-10-CM | POA: Diagnosis present

## 2019-08-14 DIAGNOSIS — J90 Pleural effusion, not elsewhere classified: Secondary | ICD-10-CM | POA: Diagnosis not present

## 2019-08-14 DIAGNOSIS — K2970 Gastritis, unspecified, without bleeding: Secondary | ICD-10-CM | POA: Diagnosis present

## 2019-08-14 DIAGNOSIS — F17210 Nicotine dependence, cigarettes, uncomplicated: Secondary | ICD-10-CM | POA: Diagnosis present

## 2019-08-14 DIAGNOSIS — G610 Guillain-Barre syndrome: Secondary | ICD-10-CM | POA: Diagnosis present

## 2019-08-14 DIAGNOSIS — F40240 Claustrophobia: Secondary | ICD-10-CM | POA: Diagnosis not present

## 2019-08-14 DIAGNOSIS — K7290 Hepatic failure, unspecified without coma: Secondary | ICD-10-CM | POA: Diagnosis not present

## 2019-08-14 DIAGNOSIS — K449 Diaphragmatic hernia without obstruction or gangrene: Secondary | ICD-10-CM | POA: Diagnosis present

## 2019-08-14 DIAGNOSIS — K209 Esophagitis, unspecified without bleeding: Secondary | ICD-10-CM | POA: Diagnosis present

## 2019-08-14 DIAGNOSIS — Z452 Encounter for adjustment and management of vascular access device: Secondary | ICD-10-CM

## 2019-08-14 DIAGNOSIS — F1020 Alcohol dependence, uncomplicated: Secondary | ICD-10-CM | POA: Diagnosis present

## 2019-08-14 DIAGNOSIS — Z9889 Other specified postprocedural states: Secondary | ICD-10-CM

## 2019-08-14 DIAGNOSIS — Z515 Encounter for palliative care: Secondary | ICD-10-CM | POA: Diagnosis not present

## 2019-08-14 DIAGNOSIS — K921 Melena: Secondary | ICD-10-CM | POA: Diagnosis present

## 2019-08-14 DIAGNOSIS — J9601 Acute respiratory failure with hypoxia: Secondary | ICD-10-CM | POA: Diagnosis not present

## 2019-08-14 DIAGNOSIS — D696 Thrombocytopenia, unspecified: Secondary | ICD-10-CM | POA: Diagnosis present

## 2019-08-14 DIAGNOSIS — I10 Essential (primary) hypertension: Secondary | ICD-10-CM | POA: Diagnosis present

## 2019-08-14 DIAGNOSIS — K529 Noninfective gastroenteritis and colitis, unspecified: Secondary | ICD-10-CM | POA: Diagnosis present

## 2019-08-14 DIAGNOSIS — K2990 Gastroduodenitis, unspecified, without bleeding: Secondary | ICD-10-CM | POA: Diagnosis present

## 2019-08-14 DIAGNOSIS — Z915 Personal history of self-harm: Secondary | ICD-10-CM

## 2019-08-14 DIAGNOSIS — R188 Other ascites: Secondary | ICD-10-CM

## 2019-08-14 DIAGNOSIS — E874 Mixed disorder of acid-base balance: Secondary | ICD-10-CM | POA: Diagnosis not present

## 2019-08-14 DIAGNOSIS — K766 Portal hypertension: Principal | ICD-10-CM | POA: Diagnosis present

## 2019-08-14 DIAGNOSIS — Z978 Presence of other specified devices: Secondary | ICD-10-CM

## 2019-08-14 DIAGNOSIS — R339 Retention of urine, unspecified: Secondary | ICD-10-CM | POA: Diagnosis present

## 2019-08-14 DIAGNOSIS — R627 Adult failure to thrive: Secondary | ICD-10-CM | POA: Diagnosis present

## 2019-08-14 DIAGNOSIS — R0600 Dyspnea, unspecified: Secondary | ICD-10-CM

## 2019-08-14 DIAGNOSIS — R1084 Generalized abdominal pain: Secondary | ICD-10-CM

## 2019-08-14 DIAGNOSIS — K861 Other chronic pancreatitis: Secondary | ICD-10-CM | POA: Diagnosis present

## 2019-08-14 DIAGNOSIS — G9340 Encephalopathy, unspecified: Secondary | ICD-10-CM

## 2019-08-14 DIAGNOSIS — R112 Nausea with vomiting, unspecified: Secondary | ICD-10-CM | POA: Diagnosis present

## 2019-08-14 DIAGNOSIS — J690 Pneumonitis due to inhalation of food and vomit: Secondary | ICD-10-CM | POA: Diagnosis not present

## 2019-08-14 DIAGNOSIS — M1990 Unspecified osteoarthritis, unspecified site: Secondary | ICD-10-CM | POA: Diagnosis present

## 2019-08-14 DIAGNOSIS — Z7189 Other specified counseling: Secondary | ICD-10-CM

## 2019-08-14 DIAGNOSIS — Z887 Allergy status to serum and vaccine status: Secondary | ICD-10-CM

## 2019-08-14 DIAGNOSIS — K317 Polyp of stomach and duodenum: Secondary | ICD-10-CM | POA: Diagnosis present

## 2019-08-14 DIAGNOSIS — R64 Cachexia: Secondary | ICD-10-CM | POA: Diagnosis present

## 2019-08-14 DIAGNOSIS — J441 Chronic obstructive pulmonary disease with (acute) exacerbation: Secondary | ICD-10-CM | POA: Diagnosis not present

## 2019-08-14 DIAGNOSIS — Z825 Family history of asthma and other chronic lower respiratory diseases: Secondary | ICD-10-CM

## 2019-08-14 DIAGNOSIS — N319 Neuromuscular dysfunction of bladder, unspecified: Secondary | ICD-10-CM | POA: Diagnosis present

## 2019-08-14 DIAGNOSIS — I700 Atherosclerosis of aorta: Secondary | ICD-10-CM | POA: Diagnosis present

## 2019-08-14 DIAGNOSIS — Z781 Physical restraint status: Secondary | ICD-10-CM

## 2019-08-14 DIAGNOSIS — Z803 Family history of malignant neoplasm of breast: Secondary | ICD-10-CM

## 2019-08-14 DIAGNOSIS — K760 Fatty (change of) liver, not elsewhere classified: Secondary | ICD-10-CM | POA: Diagnosis present

## 2019-08-14 DIAGNOSIS — E870 Hyperosmolality and hypernatremia: Secondary | ICD-10-CM | POA: Diagnosis not present

## 2019-08-14 DIAGNOSIS — T17908A Unspecified foreign body in respiratory tract, part unspecified causing other injury, initial encounter: Secondary | ICD-10-CM

## 2019-08-14 DIAGNOSIS — Z8042 Family history of malignant neoplasm of prostate: Secondary | ICD-10-CM

## 2019-08-14 DIAGNOSIS — Z8249 Family history of ischemic heart disease and other diseases of the circulatory system: Secondary | ICD-10-CM

## 2019-08-14 DIAGNOSIS — D539 Nutritional anemia, unspecified: Secondary | ICD-10-CM | POA: Diagnosis present

## 2019-08-14 DIAGNOSIS — Z20822 Contact with and (suspected) exposure to covid-19: Secondary | ICD-10-CM | POA: Diagnosis present

## 2019-08-14 LAB — CBC
HCT: 35 % — ABNORMAL LOW (ref 36.0–46.0)
Hemoglobin: 10.8 g/dL — ABNORMAL LOW (ref 12.0–15.0)
MCH: 33.8 pg (ref 26.0–34.0)
MCHC: 30.9 g/dL (ref 30.0–36.0)
MCV: 109.4 fL — ABNORMAL HIGH (ref 80.0–100.0)
Platelets: 227 10*3/uL (ref 150–400)
RBC: 3.2 MIL/uL — ABNORMAL LOW (ref 3.87–5.11)
RDW: 16.5 % — ABNORMAL HIGH (ref 11.5–15.5)
WBC: 12 10*3/uL — ABNORMAL HIGH (ref 4.0–10.5)
nRBC: 0 % (ref 0.0–0.2)

## 2019-08-14 LAB — COMPREHENSIVE METABOLIC PANEL
ALT: 40 U/L (ref 0–44)
AST: 47 U/L — ABNORMAL HIGH (ref 15–41)
Albumin: 1.5 g/dL — ABNORMAL LOW (ref 3.5–5.0)
Alkaline Phosphatase: 231 U/L — ABNORMAL HIGH (ref 38–126)
Anion gap: 11 (ref 5–15)
BUN: <5 mg/dL — ABNORMAL LOW (ref 6–20)
CO2: 21 mmol/L — ABNORMAL LOW (ref 22–32)
Calcium: 7.7 mg/dL — ABNORMAL LOW (ref 8.9–10.3)
Chloride: 104 mmol/L (ref 98–111)
Creatinine, Ser: 0.78 mg/dL (ref 0.44–1.00)
GFR calc Af Amer: >60 mL/min (ref >60)
GFR calc non Af Amer: >60 mL/min (ref >60)
Glucose, Bld: 134 mg/dL — ABNORMAL HIGH (ref 70–99)
Potassium: 3.8 mmol/L (ref 3.5–5.1)
Sodium: 136 mmol/L (ref 135–145)
Total Bilirubin: 1.2 mg/dL (ref 0.3–1.2)
Total Protein: 5.4 g/dL — ABNORMAL LOW (ref 6.5–8.1)

## 2019-08-14 LAB — LIPASE, BLOOD: Lipase: 13 U/L (ref 11–51)

## 2019-08-14 MED ORDER — SODIUM CHLORIDE 0.9% FLUSH
3.0000 mL | Freq: Once | INTRAVENOUS | Status: AC
  Administered 2019-08-15: 01:00:00 3 mL via INTRAVENOUS

## 2019-08-14 NOTE — ED Triage Notes (Signed)
Per EMS, pt from home w/ upper right quad pain.  Also complains of N/V/D.  Family states she has had two falls in the past two days, no blood thinners.  Pt walks w/ a walker and has some "cognative issues"  116/78 110HR 18 RR 98%RA CBG 127

## 2019-08-15 ENCOUNTER — Other Ambulatory Visit: Payer: Self-pay

## 2019-08-15 ENCOUNTER — Inpatient Hospital Stay (HOSPITAL_COMMUNITY): Payer: Medicaid Other

## 2019-08-15 ENCOUNTER — Emergency Department (HOSPITAL_COMMUNITY): Payer: Medicaid Other

## 2019-08-15 DIAGNOSIS — Z9889 Other specified postprocedural states: Secondary | ICD-10-CM | POA: Diagnosis not present

## 2019-08-15 DIAGNOSIS — Z681 Body mass index (BMI) 19 or less, adult: Secondary | ICD-10-CM | POA: Diagnosis not present

## 2019-08-15 DIAGNOSIS — R52 Pain, unspecified: Secondary | ICD-10-CM

## 2019-08-15 DIAGNOSIS — K529 Noninfective gastroenteritis and colitis, unspecified: Secondary | ICD-10-CM | POA: Diagnosis present

## 2019-08-15 DIAGNOSIS — Z7289 Other problems related to lifestyle: Secondary | ICD-10-CM

## 2019-08-15 DIAGNOSIS — R601 Generalized edema: Secondary | ICD-10-CM | POA: Diagnosis not present

## 2019-08-15 DIAGNOSIS — K7460 Unspecified cirrhosis of liver: Secondary | ICD-10-CM | POA: Diagnosis not present

## 2019-08-15 DIAGNOSIS — R627 Adult failure to thrive: Secondary | ICD-10-CM | POA: Diagnosis not present

## 2019-08-15 DIAGNOSIS — D539 Nutritional anemia, unspecified: Secondary | ICD-10-CM | POA: Diagnosis not present

## 2019-08-15 DIAGNOSIS — R1084 Generalized abdominal pain: Secondary | ICD-10-CM | POA: Diagnosis present

## 2019-08-15 DIAGNOSIS — R112 Nausea with vomiting, unspecified: Secondary | ICD-10-CM | POA: Diagnosis not present

## 2019-08-15 DIAGNOSIS — J90 Pleural effusion, not elsewhere classified: Secondary | ICD-10-CM | POA: Diagnosis not present

## 2019-08-15 DIAGNOSIS — Z515 Encounter for palliative care: Secondary | ICD-10-CM | POA: Diagnosis not present

## 2019-08-15 DIAGNOSIS — K449 Diaphragmatic hernia without obstruction or gangrene: Secondary | ICD-10-CM | POA: Diagnosis not present

## 2019-08-15 DIAGNOSIS — K652 Spontaneous bacterial peritonitis: Secondary | ICD-10-CM | POA: Diagnosis present

## 2019-08-15 DIAGNOSIS — Z8249 Family history of ischemic heart disease and other diseases of the circulatory system: Secondary | ICD-10-CM | POA: Diagnosis not present

## 2019-08-15 DIAGNOSIS — F17210 Nicotine dependence, cigarettes, uncomplicated: Secondary | ICD-10-CM

## 2019-08-15 DIAGNOSIS — K209 Esophagitis, unspecified without bleeding: Secondary | ICD-10-CM | POA: Diagnosis not present

## 2019-08-15 DIAGNOSIS — K921 Melena: Secondary | ICD-10-CM | POA: Diagnosis present

## 2019-08-15 DIAGNOSIS — Z887 Allergy status to serum and vaccine status: Secondary | ICD-10-CM

## 2019-08-15 DIAGNOSIS — R110 Nausea: Secondary | ICD-10-CM

## 2019-08-15 DIAGNOSIS — T17908D Unspecified foreign body in respiratory tract, part unspecified causing other injury, subsequent encounter: Secondary | ICD-10-CM | POA: Diagnosis not present

## 2019-08-15 DIAGNOSIS — Z803 Family history of malignant neoplasm of breast: Secondary | ICD-10-CM | POA: Diagnosis not present

## 2019-08-15 DIAGNOSIS — J441 Chronic obstructive pulmonary disease with (acute) exacerbation: Secondary | ICD-10-CM | POA: Diagnosis not present

## 2019-08-15 DIAGNOSIS — I340 Nonrheumatic mitral (valve) insufficiency: Secondary | ICD-10-CM | POA: Diagnosis not present

## 2019-08-15 DIAGNOSIS — R188 Other ascites: Secondary | ICD-10-CM | POA: Diagnosis present

## 2019-08-15 DIAGNOSIS — K8689 Other specified diseases of pancreas: Secondary | ICD-10-CM

## 2019-08-15 DIAGNOSIS — E870 Hyperosmolality and hypernatremia: Secondary | ICD-10-CM | POA: Diagnosis not present

## 2019-08-15 DIAGNOSIS — K2100 Gastro-esophageal reflux disease with esophagitis, without bleeding: Secondary | ICD-10-CM | POA: Diagnosis not present

## 2019-08-15 DIAGNOSIS — K2970 Gastritis, unspecified, without bleeding: Secondary | ICD-10-CM | POA: Diagnosis not present

## 2019-08-15 DIAGNOSIS — I361 Nonrheumatic tricuspid (valve) insufficiency: Secondary | ICD-10-CM | POA: Diagnosis not present

## 2019-08-15 DIAGNOSIS — Z66 Do not resuscitate: Secondary | ICD-10-CM | POA: Diagnosis not present

## 2019-08-15 DIAGNOSIS — R339 Retention of urine, unspecified: Secondary | ICD-10-CM

## 2019-08-15 DIAGNOSIS — Z20822 Contact with and (suspected) exposure to covid-19: Secondary | ICD-10-CM | POA: Diagnosis present

## 2019-08-15 DIAGNOSIS — I10 Essential (primary) hypertension: Secondary | ICD-10-CM

## 2019-08-15 DIAGNOSIS — Z841 Family history of disorders of kidney and ureter: Secondary | ICD-10-CM | POA: Diagnosis not present

## 2019-08-15 DIAGNOSIS — G650 Sequelae of Guillain-Barre syndrome: Secondary | ICD-10-CM

## 2019-08-15 DIAGNOSIS — I469 Cardiac arrest, cause unspecified: Secondary | ICD-10-CM | POA: Diagnosis not present

## 2019-08-15 DIAGNOSIS — R0603 Acute respiratory distress: Secondary | ICD-10-CM | POA: Diagnosis not present

## 2019-08-15 DIAGNOSIS — D72829 Elevated white blood cell count, unspecified: Secondary | ICD-10-CM

## 2019-08-15 DIAGNOSIS — R0600 Dyspnea, unspecified: Secondary | ICD-10-CM | POA: Diagnosis not present

## 2019-08-15 DIAGNOSIS — M7989 Other specified soft tissue disorders: Secondary | ICD-10-CM | POA: Diagnosis not present

## 2019-08-15 DIAGNOSIS — G9340 Encephalopathy, unspecified: Secondary | ICD-10-CM | POA: Diagnosis not present

## 2019-08-15 DIAGNOSIS — E43 Unspecified severe protein-calorie malnutrition: Secondary | ICD-10-CM | POA: Diagnosis present

## 2019-08-15 DIAGNOSIS — R0902 Hypoxemia: Secondary | ICD-10-CM | POA: Diagnosis not present

## 2019-08-15 DIAGNOSIS — J449 Chronic obstructive pulmonary disease, unspecified: Secondary | ICD-10-CM

## 2019-08-15 DIAGNOSIS — R64 Cachexia: Secondary | ICD-10-CM | POA: Diagnosis present

## 2019-08-15 DIAGNOSIS — J690 Pneumonitis due to inhalation of food and vomit: Secondary | ICD-10-CM | POA: Diagnosis not present

## 2019-08-15 DIAGNOSIS — Z888 Allergy status to other drugs, medicaments and biological substances status: Secondary | ICD-10-CM

## 2019-08-15 DIAGNOSIS — K317 Polyp of stomach and duodenum: Secondary | ICD-10-CM | POA: Diagnosis not present

## 2019-08-15 DIAGNOSIS — J9601 Acute respiratory failure with hypoxia: Secondary | ICD-10-CM | POA: Diagnosis not present

## 2019-08-15 DIAGNOSIS — Z833 Family history of diabetes mellitus: Secondary | ICD-10-CM | POA: Diagnosis not present

## 2019-08-15 DIAGNOSIS — K766 Portal hypertension: Secondary | ICD-10-CM | POA: Diagnosis present

## 2019-08-15 DIAGNOSIS — R000 Tachycardia, unspecified: Secondary | ICD-10-CM | POA: Diagnosis not present

## 2019-08-15 DIAGNOSIS — M79662 Pain in left lower leg: Secondary | ICD-10-CM

## 2019-08-15 DIAGNOSIS — K861 Other chronic pancreatitis: Secondary | ICD-10-CM | POA: Diagnosis present

## 2019-08-15 DIAGNOSIS — E874 Mixed disorder of acid-base balance: Secondary | ICD-10-CM | POA: Diagnosis not present

## 2019-08-15 DIAGNOSIS — E8809 Other disorders of plasma-protein metabolism, not elsewhere classified: Secondary | ICD-10-CM | POA: Diagnosis present

## 2019-08-15 DIAGNOSIS — G610 Guillain-Barre syndrome: Secondary | ICD-10-CM | POA: Diagnosis present

## 2019-08-15 DIAGNOSIS — M1990 Unspecified osteoarthritis, unspecified site: Secondary | ICD-10-CM | POA: Diagnosis present

## 2019-08-15 DIAGNOSIS — Z7189 Other specified counseling: Secondary | ICD-10-CM | POA: Diagnosis not present

## 2019-08-15 HISTORY — PX: IR PARACENTESIS: IMG2679

## 2019-08-15 LAB — SARS CORONAVIRUS 2 (TAT 6-24 HRS): SARS Coronavirus 2: NEGATIVE

## 2019-08-15 LAB — URINALYSIS, ROUTINE W REFLEX MICROSCOPIC
Bilirubin Urine: NEGATIVE
Glucose, UA: NEGATIVE mg/dL
Hgb urine dipstick: NEGATIVE
Ketones, ur: NEGATIVE mg/dL
Leukocytes,Ua: NEGATIVE
Nitrite: NEGATIVE
Protein, ur: NEGATIVE mg/dL
Specific Gravity, Urine: 1.025 (ref 1.005–1.030)
pH: 5 (ref 5.0–8.0)

## 2019-08-15 LAB — BODY FLUID CELL COUNT WITH DIFFERENTIAL
Eos, Fluid: 0 %
Lymphs, Fluid: 44 %
Monocyte-Macrophage-Serous Fluid: 56 % (ref 50–90)
Neutrophil Count, Fluid: 0 % (ref 0–25)
Total Nucleated Cell Count, Fluid: 31 cu mm (ref 0–1000)

## 2019-08-15 LAB — OCCULT BLOOD GASTRIC / DUODENUM (SPECIMEN CUP): Occult Blood, Gastric: POSITIVE — AB

## 2019-08-15 LAB — GRAM STAIN: Gram Stain: NONE SEEN

## 2019-08-15 LAB — AMYLASE, PLEURAL OR PERITONEAL FLUID: Amylase, Fluid: <5 U/L

## 2019-08-15 LAB — SARS CORONAVIRUS 2 BY RT PCR (DIASORIN): SARS Coronavirus 2: NEGATIVE

## 2019-08-15 LAB — PROTIME-INR
INR: 1.1 (ref 0.8–1.2)
Prothrombin Time: 13.7 seconds (ref 11.4–15.2)

## 2019-08-15 LAB — CREATININE, SERUM
Creatinine, Ser: 0.71 mg/dL (ref 0.44–1.00)
GFR calc Af Amer: >60 mL/min (ref >60)
GFR calc non Af Amer: >60 mL/min (ref >60)

## 2019-08-15 LAB — PHOSPHORUS: Phosphorus: 3.7 mg/dL (ref 2.5–4.6)

## 2019-08-15 LAB — BRAIN NATRIURETIC PEPTIDE: B Natriuretic Peptide: 105.6 pg/mL — ABNORMAL HIGH (ref 0.0–100.0)

## 2019-08-15 LAB — LACTIC ACID, PLASMA: Lactic Acid, Venous: 1.6 mmol/L (ref 0.5–1.9)

## 2019-08-15 LAB — PREALBUMIN: Prealbumin: 6.4 mg/dL — ABNORMAL LOW (ref 18–38)

## 2019-08-15 LAB — ALBUMIN, PLEURAL OR PERITONEAL FLUID: Albumin, Fluid: <1 g/dL

## 2019-08-15 LAB — MAGNESIUM: Magnesium: 1.2 mg/dL — ABNORMAL LOW (ref 1.7–2.4)

## 2019-08-15 LAB — APTT: aPTT: 29 seconds (ref 24–36)

## 2019-08-15 LAB — LACTATE DEHYDROGENASE, PLEURAL OR PERITONEAL FLUID: LD, Fluid: 17 U/L (ref 3–23)

## 2019-08-15 LAB — PROTEIN, PLEURAL OR PERITONEAL FLUID: Total protein, fluid: <3 g/dL

## 2019-08-15 LAB — AMMONIA: Ammonia: 35 umol/L (ref 9–35)

## 2019-08-15 MED ORDER — THIAMINE HCL 100 MG/ML IJ SOLN: 100.0000 mg | Freq: Every day | INTRAMUSCULAR | Status: DC

## 2019-08-15 MED ORDER — METRONIDAZOLE IN NACL 5-0.79 MG/ML-% IV SOLN
500.0000 mg | Freq: Once | INTRAVENOUS | Status: AC
  Administered 2019-08-15: 05:00:00 500 mg via INTRAVENOUS
  Filled 2019-08-15: qty 100

## 2019-08-15 MED ORDER — HYDROMORPHONE HCL 1 MG/ML IJ SOLN: 1.0000 mg | INTRAMUSCULAR | Status: DC | PRN

## 2019-08-15 MED ORDER — SPIRONOLACTONE 25 MG PO TABS
50.0000 mg | ORAL_TABLET | Freq: Once | ORAL | Status: DC
  Filled 2019-08-15: qty 2

## 2019-08-15 MED ORDER — ONDANSETRON HCL 4 MG PO TABS: 4.0000 mg | ORAL_TABLET | Freq: Four times a day (QID) | ORAL | Status: DC | PRN

## 2019-08-15 MED ORDER — THIAMINE HCL 100 MG/ML IJ SOLN
500.0000 mg | Freq: Once | INTRAVENOUS | Status: AC
  Administered 2019-08-15: 500 mg via INTRAVENOUS
  Filled 2019-08-15: qty 5

## 2019-08-15 MED ORDER — SODIUM CHLORIDE 0.9 % IV SOLN
2.0000 g | Freq: Every day | INTRAVENOUS | Status: DC
  Administered 2019-08-15 – 2019-08-17 (×3): 2 g via INTRAVENOUS
  Filled 2019-08-15: qty 20
  Filled 2019-08-15 (×2): qty 2

## 2019-08-15 MED ORDER — ALBUTEROL SULFATE (2.5 MG/3ML) 0.083% IN NEBU
2.5000 mg | INHALATION_SOLUTION | Freq: Four times a day (QID) | RESPIRATORY_TRACT | Status: DC | PRN
  Administered 2019-08-21 (×2): 2.5 mg via RESPIRATORY_TRACT
  Filled 2019-08-15 (×2): qty 3

## 2019-08-15 MED ORDER — ASPIRIN EC 81 MG PO TBEC
81.0000 mg | DELAYED_RELEASE_TABLET | Freq: Every day | ORAL | Status: DC
  Administered 2019-08-17 – 2019-08-31 (×12): 81 mg via ORAL
  Filled 2019-08-15 (×17): qty 1

## 2019-08-15 MED ORDER — HYDROMORPHONE HCL 1 MG/ML IJ SOLN
0.5000 mg | INTRAMUSCULAR | Status: DC | PRN
  Administered 2019-08-15 – 2019-08-20 (×12): 0.5 mg via INTRAVENOUS
  Filled 2019-08-15 (×13): qty 0.5

## 2019-08-15 MED ORDER — THIAMINE HCL 100 MG/ML IJ SOLN
100.0000 mg | Freq: Every day | INTRAMUSCULAR | Status: AC
  Administered 2019-08-17 – 2019-08-19 (×3): 100 mg via INTRAVENOUS
  Filled 2019-08-15 (×4): qty 2

## 2019-08-15 MED ORDER — MAGNESIUM SULFATE 4 GM/100ML IV SOLN
4.0000 g | Freq: Once | INTRAVENOUS | Status: AC
  Administered 2019-08-15: 4 g via INTRAVENOUS
  Filled 2019-08-15: qty 100

## 2019-08-15 MED ORDER — DULOXETINE HCL 30 MG PO CPEP: 30.0000 mg | ORAL_CAPSULE | Freq: Every day | ORAL | Status: DC

## 2019-08-15 MED ORDER — PROCHLORPERAZINE EDISYLATE 10 MG/2ML IJ SOLN: 5.0000 mg | Freq: Four times a day (QID) | INTRAMUSCULAR | Status: DC | PRN

## 2019-08-15 MED ORDER — SODIUM CHLORIDE 0.9 % IV BOLUS
1000.0000 mL | Freq: Once | INTRAVENOUS | Status: AC
  Administered 2019-08-15: 01:00:00 1000 mL via INTRAVENOUS

## 2019-08-15 MED ORDER — ENOXAPARIN SODIUM 40 MG/0.4ML ~~LOC~~ SOLN
40.0000 mg | SUBCUTANEOUS | Status: DC
  Administered 2019-08-15 – 2019-08-21 (×7): 40 mg via SUBCUTANEOUS
  Filled 2019-08-15 (×7): qty 0.4

## 2019-08-15 MED ORDER — MAGNESIUM SULFATE 2 GM/50ML IV SOLN
2.0000 g | Freq: Once | INTRAVENOUS | Status: AC
  Administered 2019-08-15: 21:00:00 2 g via INTRAVENOUS
  Filled 2019-08-15: qty 50

## 2019-08-15 MED ORDER — ORAL CARE MOUTH RINSE
15.0000 mL | Freq: Two times a day (BID) | OROMUCOSAL | Status: DC
  Administered 2019-08-16 – 2019-08-31 (×21): 15 mL via OROMUCOSAL

## 2019-08-15 MED ORDER — ONDANSETRON HCL 4 MG/2ML IJ SOLN
4.0000 mg | Freq: Once | INTRAMUSCULAR | Status: AC
  Administered 2019-08-15: 01:00:00 4 mg via INTRAVENOUS
  Filled 2019-08-15: qty 2

## 2019-08-15 MED ORDER — ONDANSETRON HCL 4 MG/2ML IJ SOLN: 4.0000 mg | Freq: Four times a day (QID) | INTRAMUSCULAR | Status: DC | PRN

## 2019-08-15 MED ORDER — PROMETHAZINE HCL 25 MG/ML IJ SOLN
12.5000 mg | Freq: Four times a day (QID) | INTRAMUSCULAR | Status: DC | PRN
  Administered 2019-08-20 – 2019-08-24 (×12): 12.5 mg via INTRAVENOUS
  Filled 2019-08-15 (×12): qty 1

## 2019-08-15 MED ORDER — PANTOPRAZOLE SODIUM 40 MG IV SOLR
40.0000 mg | Freq: Two times a day (BID) | INTRAVENOUS | Status: DC
  Administered 2019-08-15 – 2019-08-31 (×31): 40 mg via INTRAVENOUS
  Filled 2019-08-15 (×34): qty 40

## 2019-08-15 MED ORDER — LIDOCAINE HCL (PF) 1 % IJ SOLN
INTRAMUSCULAR | Status: DC | PRN
  Administered 2019-08-15: 10 mL

## 2019-08-15 MED ORDER — MORPHINE SULFATE (PF) 4 MG/ML IV SOLN
4.0000 mg | Freq: Once | INTRAVENOUS | Status: AC
  Administered 2019-08-15: 01:00:00 4 mg via INTRAVENOUS
  Filled 2019-08-15: qty 1

## 2019-08-15 MED ORDER — FUROSEMIDE 10 MG/ML IJ SOLN: 20.0000 mg | Freq: Once | INTRAMUSCULAR | Status: DC

## 2019-08-15 MED ORDER — IOHEXOL 300 MG/ML  SOLN
100.0000 mL | Freq: Once | INTRAMUSCULAR | Status: AC | PRN
  Administered 2019-08-15: 01:00:00 100 mL via INTRAVENOUS

## 2019-08-15 MED ORDER — EMETROL 1.87-1.87-21.5 PO SOLN
10.0000 mL | ORAL | Status: DC | PRN
  Filled 2019-08-15: qty 118

## 2019-08-15 MED ORDER — FENTANYL CITRATE (PF) 100 MCG/2ML IJ SOLN: 12.5000 ug | INTRAMUSCULAR | Status: DC | PRN

## 2019-08-15 MED ORDER — SODIUM CHLORIDE 0.9 % IV SOLN
80.0000 mg | Freq: Once | INTRAVENOUS | Status: AC
  Administered 2019-08-15: 80 mg via INTRAVENOUS
  Filled 2019-08-15: qty 80

## 2019-08-15 MED ORDER — FOLIC ACID 5 MG/ML IJ SOLN
1.0000 mg | Freq: Every day | INTRAMUSCULAR | Status: AC
  Administered 2019-08-15 – 2019-08-19 (×4): 1 mg via INTRAVENOUS
  Filled 2019-08-15 (×5): qty 0.2

## 2019-08-15 MED ORDER — METRONIDAZOLE IN NACL 5-0.79 MG/ML-% IV SOLN
500.0000 mg | Freq: Three times a day (TID) | INTRAVENOUS | Status: DC
  Administered 2019-08-15 – 2019-08-17 (×5): 500 mg via INTRAVENOUS
  Filled 2019-08-15 (×5): qty 100

## 2019-08-15 MED ORDER — LIDOCAINE HCL 1 % IJ SOLN
INTRAMUSCULAR | Status: AC
  Filled 2019-08-15: qty 20

## 2019-08-15 MED ORDER — BOOST / RESOURCE BREEZE PO LIQD CUSTOM
1.0000 | Freq: Three times a day (TID) | ORAL | Status: DC
  Administered 2019-08-16: 21:00:00 1 via ORAL

## 2019-08-15 MED ORDER — SPIRONOLACTONE 25 MG PO TABS: 50.0000 mg | ORAL_TABLET | Freq: Every day | ORAL | Status: DC

## 2019-08-15 MED ORDER — FUROSEMIDE 20 MG PO TABS: 20.0000 mg | ORAL_TABLET | Freq: Once | ORAL | Status: DC

## 2019-08-15 NOTE — Progress Notes (Signed)
Left lower extremity venous duplex exam completed.  Preliminary results can be found under CV proc under chart review.  08/15/2019 1:29 PM  Regina Coppolino, K., RDMS, RVT

## 2019-08-15 NOTE — Plan of Care (Signed)

## 2019-08-15 NOTE — ED Notes (Signed)
Pt made aware that we need urine specimen. 

## 2019-08-15 NOTE — ED Notes (Signed)
Pt vomited x1; dark brown in color (possibly blood).

## 2019-08-15 NOTE — ED Provider Notes (Signed)
[facility] EMERGENCY DEPARTMENT Provider Note   CSN: [id number] Arrival date & time: 07/23/2019  2048     History Chief Complaint  Patient presents with  . Abdominal Pain    Catherine Butler is a 59 y.o. female.  Patient is a 59 year old female with past medical history of COPD, hypertension, Debera Lat, and recent admission for colitis.  Patient was discharged several days ago.  She returns with complaints of ongoing abdominal pain and vomiting.  She denies fevers or chills.  She admits to some diarrhea, but denies any bloody stool or melena.  The history is provided by the patient.  Abdominal Pain Pain location:  Generalized Pain quality: cramping   Pain radiates to:  Does not radiate Pain severity:  Moderate Duration:  2 weeks Timing:  Intermittent Progression:  Worsening Chronicity:  New Relieved by:  Nothing Worsened by:  Nothing Ineffective treatments:  None tried      Past Medical History:  Diagnosis Date  . Anxiety   . Arthritis   . Asthma   . Complication of anesthesia    nausea  . COPD (chronic obstructive pulmonary disease) ([place])   . Depression   . Guillain-Barre ([place]) 2018  . H/O alcohol abuse   . Hypertension    pt denies  . Nausea & vomiting 07/2019  . PONV (postoperative nausea and vomiting)   . Reflux   . Suicide attempt by multiple drug overdose [facility]) 2014    Patient Active Problem List   Diagnosis Date Noted  . Intractable nausea and vomiting 08/05/2019  . Urinary retention 06/12/2019  . Odynophagia 06/12/2019  . Anemia 06/12/2019  . Acute renal failure (ARF) ([place]) 06/02/2019  . High anion gap metabolic acidosis 35/32/9924  . Hyponatremia 06/02/2019  . Acute urinary retention   . Protein-calorie malnutrition, severe 07/21/2017  . Colonic mass 07/18/2017  . Dehydration 07/18/2017  . Hypomagnesemia 07/18/2017  . History of prolonged Q-T interval on ECG 07/06/2017  . Tachycardia   . Generalized anxiety disorder   .  Debility 06/23/2017  . Constipation   . Quadriplegia and quadriparesis ([place])   . Guillain Barr syndrome ([place])   . Hypokalemia 06/21/2017  . Sinus tachycardia 06/21/2017  . Essential hypertension 04/14/2017  . Paresthesia   . 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 Tobacco abuse 01/01/2017  . Abnormal LFTs 01/01/2017  . Depression   . COPD (chronic obstructive pulmonary disease) ([place]) 06/16/2015    Past Surgical History:  Procedure Laterality Date  . COLONOSCOPY WITH PROPOFOL N/A 07/21/2017   Procedure: COLONOSCOPY WITH PROPOFOL;  Surgeon: Milus Banister, MD;  Location: [facility] ENDOSCOPY;  Service: Endoscopy;  Laterality: N/A;  . LAPAROSCOPY    . laporscopy surgery for ovarian cyst  20 years ago  . TONSILLECTOMY    . TUBAL LIGATION       OB History    Gravida  3   Para  1   Term  1   Preterm      AB  1   Living  1     SAB  1   TAB      Ectopic      Multiple      Live Births              Family History  Problem Relation Age of Onset  . Diabetes Father   . Hypertension Father 62       heart attack  . Kidney disease  Father   . Heart attack Father   . Heart failure Father   . Dementia Father   . Breast cancer Mother 30       lump removed, bile duct cancer  . COPD Mother   . Hypertension Brother   . Allergic rhinitis Paternal Grandfather     Social History   Tobacco Use  . Smoking status: Current Every Day Smoker    Packs/day: 1.00    Years: 40.00    Pack years: 40.00    Types: Cigarettes  . Smokeless tobacco: Never Used  Substance Use Topics  . Alcohol use: Yes    Comment: 1-2 glasses a month  . Drug use: No    Home Medications Prior to Admission medications   Medication Sig Start Date End Date Taking? Authorizing Provider  acetaminophen (TYLENOL) 500 MG tablet Take 1 tablet (500 mg total) by mouth every 4 (four) hours as needed for headache. 08/10/19   Katherine Roan, MD  albuterol (PROVENTIL  HFA;VENTOLIN HFA) 108 (90 Base) MCG/ACT inhaler Inhale 2 puffs into the lungs every 6 (six) hours as needed for wheezing. 08/30/18   Grayce Sessions, NP  albuterol (PROVENTIL) (2.5 MG/3ML) 0.083% nebulizer solution Take 3 mLs (2.5 mg total) by nebulization every 6 (six) hours as needed for wheezing or shortness of breath. 08/30/18   Grayce Sessions, NP  aspirin EC 81 MG tablet Take 1 tablet (81 mg total) by mouth daily. 03/28/18   Loletta Specter, PA-C  DULoxetine (CYMBALTA) 30 MG capsule Take 1 capsule (30 mg total) by mouth daily. 05/29/19   Grayce Sessions, NP  esomeprazole (NEXIUM) 40 MG capsule TAKE 1 CAPSULE BY MOUTH ONCE DAILY BEFORE BREAKFAST Patient taking differently: Take 40 mg by mouth daily. Before breakfast 06/07/19   Rachael Fee, MD  feeding supplement, ENSURE ENLIVE, (ENSURE ENLIVE) LIQD Take 237 mLs by mouth 3 (three) times daily between meals. 08/10/19   Katherine Roan, MD  gabapentin (NEURONTIN) 600 MG tablet Take 2 tablets by mouth three times a day Patient taking differently: Take 1,200 mg by mouth 3 (three) times daily.  05/29/19   Grayce Sessions, NP  ibuprofen (ADVIL,MOTRIN) 200 MG tablet Take 400 mg by mouth every 6 (six) hours as needed for fever, headache, mild pain, moderate pain or cramping.    [provider]  LINZESS 145 MCG CAPS capsule TAKE 1 CAPSULE BY MOUTH ONCE DAILY BEFORE BREAKFAST. TAKE ON AN EMPTY STOMACH. Patient taking differently: Take 145 mcg by mouth daily before breakfast.  05/23/19   Meredith Pel, NP  mirtazapine (REMERON) 15 MG tablet Take 15 mg by mouth at bedtime.    [provider]  Multiple Vitamin (MULTIVITAMIN WITH MINERALS) TABS tablet Take 1 tablet by mouth daily. 08/11/19   Katherine Roan, MD  promethazine (PHENERGAN) 6.25 MG/5ML syrup Take 20 mLs (25 mg total) by mouth every 6 (six) hours as needed for nausea or vomiting. 08/10/19 08/09/20  Katherine Roan, MD  QUEtiapine (SEROQUEL) 400 MG tablet Take  400 mg by mouth at bedtime.    [provider]  triamcinolone (KENALOG) 0.1 % paste Use as directed 1 application in the mouth or throat 2 (two) times daily. 07/25/19   Grayce Sessions, NP    Allergies    Influenza vaccines and Lipitor [atorvastatin]  Review of Systems   Review of Systems  Gastrointestinal: Positive for abdominal pain.  All other systems reviewed and are negative.   Physical Exam Updated Vital Signs  BP (!) 82/72 (BP Location: Right Arm)   Pulse (!) 119   Temp 98.4 F (36.9 C) (Oral)   Resp 16   Ht 5\' 6"  (1.676 m)   Wt 52.2 kg   LMP 02/22/2006   SpO2 100%   BMI 18.56 kg/m   Physical Exam Vitals and nursing note reviewed.  Constitutional:      General: She is not in acute distress.    Appearance: She is well-developed. She is not diaphoretic.  HENT:     Head: Normocephalic and atraumatic.  Cardiovascular:     Rate and Rhythm: Normal rate and regular rhythm.     Heart sounds: No murmur. No friction rub. No gallop.   Pulmonary:     Effort: Pulmonary effort is normal. No respiratory distress.     Breath sounds: Normal breath sounds. No wheezing.  Abdominal:     General: Bowel sounds are normal. There is no distension.     Palpations: Abdomen is soft.     Tenderness: There is abdominal tenderness in the epigastric area. There is no right CVA tenderness, left CVA tenderness, guarding or rebound.  Musculoskeletal:        General: Normal range of motion.     Cervical back: Normal range of motion and neck supple.  Skin:    General: Skin is warm and dry.  Neurological:     Mental Status: She is alert and oriented to person, place, and time.     ED Results / Procedures / Treatments   Labs (all labs ordered are listed, but only abnormal results are displayed) Labs Reviewed  COMPREHENSIVE METABOLIC PANEL - Abnormal; Notable for the following components:      Result Value   CO2 21 (*)    Glucose, Bld 134 (*)    BUN <5 (*)    Calcium 7.7 (*)     Total Protein 5.4 (*)    Albumin 1.5 (*)    AST 47 (*)    Alkaline Phosphatase 231 (*)    All other components within normal limits  CBC - Abnormal; Notable for the following components:   WBC 12.0 (*)    RBC 3.20 (*)    Hemoglobin 10.8 (*)    HCT 35.0 (*)    MCV 109.4 (*)    RDW 16.5 (*)    All other components within normal limits  LIPASE, BLOOD  URINALYSIS, ROUTINE W REFLEX MICROSCOPIC  I-STAT BETA HCG BLOOD, ED ([facility], [facility], [facility] ONLY)    EKG None  Radiology No results found.  Procedures Procedures (including critical care time)  Medications Ordered in ED Medications  sodium chloride flush (NS) 0.9 % injection 3 mL (has no administration in time range)  morphine 4 MG/ML injection 4 mg (has no administration in time range)  ondansetron (ZOFRAN) injection 4 mg (has no administration in time range)  sodium chloride 0.9 % bolus 1,000 mL (has no administration in time range)    ED Course  I have reviewed the triage vital signs and the nursing notes.  Pertinent labs & imaging results that were available during my care of the patient were reviewed by me and considered in my medical decision making (see chart for details).    MDM Rules/Calculators/A&P  Patient with recent admission for colitis returning with worsening pain, nausea.  Patient's white count is 12,000, up from 4000 during her admission.  CT scan repeated today, this time showing ascites.  Concern for spontaneous bacterial peritonitis due to this new CT  finding.  I feel as though patient will require admission and will likely require a diagnostic/therapeutic paracentesis.  I have spoken with the internal medicine teaching service who will evaluate and admit.  Final Clinical Impression(s) / ED Diagnoses Final diagnoses:  None    Rx / DC Orders ED Discharge Orders    None       Geoffery Lyons, MD 08/15/19 [phone]

## 2019-08-15 NOTE — ED Notes (Signed)
Patient to IR for paracentesis.

## 2019-08-15 NOTE — Procedures (Signed)
PROCEDURE SUMMARY:  Successful image-guided paracentesis from the left lower abdomen.  Yielded 900 milliliters of clear, very pale yellow  fluid.  No immediate complications.  EBL: zero Patient tolerated well.   Specimen was sent for labs.  Please see imaging section of Epic for full dictation.  Villa Herb PA-C 08/15/2019 9:14 AM

## 2019-08-15 NOTE — H&P (View-Only) (Signed)
[facility] Consult: 12:49 PM 08/15/2019  LOS: 0 days     Referring Provider: Dr. Rebeca Alert Primary Care Physician:  Kerin Perna, NP Primary Gastroenterologist:  Dr Ardis Hughs    Reason for Consultation:  Progression colitis, new ascites   HPI: Catherine Butler is a 59 y.o. female.  PMH COPD.  Alcohol abuse.  Hypertension.  Depression.  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  Urinary retention.  Thrombocytopenia, platelets 140 K on 07/17/2019.  Fatty liver, elevated LFTs in 12/2016.  Alcoholic hepatitis 98/0221.  Hiatal hernia, marked fatty liver, ? Acute on chronic calcific pancreatitis and ? occlusive stone in PD on CT 03/2017.  Did not tolerate attempt at MRCP in 03/2017.  Constipation, historically BM's q 10 to 14 days.  Trial of Linzess following 06/2017 colonoscopy caused fecal urgency, liquid stools.  Trial Amitiza 06/2018 inefective, resumed Linzess.  Now has 3 to 5 liquid BM's/day, less or none if skips Linzess.  06/2017 colonoscopy for abnml CT suggesting possible mass in descending colon, chronic constipation.  The colonoscopy was entirely normal to the IC valve.  Admission 12/11 -06/04/2027 with urinary retention Admission 08/05/2019 -08/10/2019 with intractable N/V.  Hypokalemia, hypomagnesemia. Alk phos at 342.  Macrocytic anemia.  Urinary retention.  06/02/2019 abdominal ultrasound showed fatty liver.  08/05/2019 CTAP with contrast showed decompressed colon with hyperenhancing mucosa and diffuse wall edema suggesting colitis, mild reactive free fluid, fatty liver.  Discharged on esomeprazole 40 mg daily, Linzess 145 mcg daily, prn promethazine  Called EMS for transport to ED for persistent abd swelling since before xmas 2020,  right abd pain, n/v of initially clear then brown emesis 2 to 10 x daily.  10 # wt drop per pt estimate.  Urinary output ok: at least 5 x daily.  Drinks Vodka, 750 mL/week on average, sometimes less.  Last Vodka was 2/20.  Pill dysphagia.  No bloody emesis or BM's.     Hb 10.8, MCV 109.  WBCs 12. Anemia profile in early 05/2019 without iron, folate or B12 deficiency. T bili 1.2.  AST/ALT 47/40 alk phos 231 (342 on 06/02/19).  Albumin 1.5 Lipase wnl now and at recent admission.    CTAP with contrast: Bilateral pleural effusions and atelectasis.  Nonspecific, focal focus of gas within atelectasis in the peripheral left lung.  Thickening in the distal esophagus, ?  Esophagitis.  Large HH containing bulk of the proximal stomach.  Gastric wall thick, though antrum spared.   Duodenum distended w fluid, wall thickened.  prox jejunum filled w fluid, w focal decompression in jejunum in LUQ.  Diffuse wall thickening throughout SB to TI, suff of infectious or inflammatory enteritis.  Diffuse hepatic steatosis.  Diffuse pancreatic atrophy with extensive calcification, no acute pancreatitis.  Large volume abdominal ascites and extensive body wall edema c/w anasarca.  Diffuse hepatic hypoattenuation possibly representing hepatic steatosis. Nonacute left rib fractures.  Deformities of thoracic and lumbar spine.  Underwent 900 mL paracentesis this morning.  Fluid studies do not support diagnosis of  SBP, no WBCs seen, no organisms on Gram stain.  Family history Mother died of old age around 82 but when she was around 58 underwent surgery and chemo for cancer of the bile duct.  Prostate cancer in uncles.  No colon cancer.  No Crohn's disease or ulcerative colitis.  Social history Lives with her son.  Alcohol intake about 750 mL vodka/week.  1 pack cigarettes/day.  Past Medical History:  Diagnosis Date   Anxiety    Arthritis    Asthma    Complication of anesthesia    nausea   COPD (chronic obstructive  pulmonary disease) ([place])    Depression    Guillain-Barre ([place]) 2018   H/O alcohol abuse    Hypertension    pt denies   Nausea & vomiting 07/2019   PONV (postoperative nausea and vomiting)    Reflux    Suicide attempt by multiple drug overdose ([place]) 2014    Past Surgical History:  Procedure Laterality Date   COLONOSCOPY WITH PROPOFOL N/A 07/21/2017   Procedure: COLONOSCOPY WITH PROPOFOL;  Surgeon: Milus Banister, MD;  Location: [facility];  Service: Endoscopy;  Laterality: N/A;   IR PARACENTESIS  08/15/2019   LAPAROSCOPY     laporscopy surgery for ovarian cyst  20 years ago   TONSILLECTOMY     TUBAL LIGATION      Prior to Admission medications   Medication Sig Start Date End Date Taking? Authorizing Provider  acetaminophen (TYLENOL) 500 MG tablet Take 1 tablet (500 mg total) by mouth every 4 (four) hours as needed for headache. 08/10/19  Yes Jeanmarie Hubert, MD  albuterol (PROVENTIL HFA;VENTOLIN HFA) 108 (90 Base) MCG/ACT inhaler Inhale 2 puffs into the lungs every 6 (six) hours as needed for wheezing. 08/30/18  Yes Kerin Perna, NP  albuterol (PROVENTIL) (2.5 MG/3ML) 0.083% nebulizer solution Take 3 mLs (2.5 mg total) by nebulization every 6 (six) hours as needed for wheezing or shortness of breath. 08/30/18  Yes Kerin Perna, NP  aspirin EC 81 MG tablet Take 1 tablet (81 mg total) by mouth daily. 03/28/18  Yes Clent Demark, PA-C  DULoxetine (CYMBALTA) 30 MG capsule Take 1 capsule (30 mg total) by mouth daily. 05/29/19  Yes Kerin Perna, NP  esomeprazole (NEXIUM) 40 MG capsule TAKE 1 CAPSULE BY MOUTH ONCE DAILY BEFORE BREAKFAST Patient taking differently: Take 40 mg by mouth daily. Before breakfast 06/07/19  Yes Milus Banister, MD  feeding supplement, ENSURE ENLIVE, (ENSURE ENLIVE) LIQD Take 237 mLs by mouth 3 (three) times daily between meals. 08/10/19  Yes Jeanmarie Hubert, MD  gabapentin (NEURONTIN) 600 MG tablet Take 2 tablets by mouth  three times a day 05/29/19  Yes Kerin Perna, NP  ibuprofen (ADVIL,MOTRIN) 200 MG tablet Take 400 mg by mouth every 6 (six) hours as needed for fever, headache, mild pain, moderate pain or cramping.   Yes [provider]  LINZESS 145 MCG CAPS capsule TAKE 1 CAPSULE BY MOUTH ONCE DAILY BEFORE BREAKFAST. TAKE ON AN EMPTY STOMACH. Patient taking differently: Take 145 mcg by mouth daily before breakfast.  05/23/19  Yes Willia Craze, NP  mirtazapine (REMERON) 15 MG tablet Take 15 mg by mouth at bedtime.   Yes [provider]  Multiple Vitamin (MULTIVITAMIN WITH MINERALS) TABS tablet Take 1 tablet by mouth daily. 08/11/19  Yes Jeanmarie Hubert, MD  promethazine (PHENERGAN) 6.25 MG/5ML syrup Take 20 mLs (25 mg total) by mouth every 6 (six) hours as needed for nausea or  vomiting. 08/10/19 08/09/20 Yes Jeanmarie Hubert, MD  QUEtiapine (SEROQUEL) 400 MG tablet Take 400 mg by mouth at bedtime.   Yes [provider]  triamcinolone (KENALOG) 0.1 % paste Use as directed 1 application in the mouth or throat 2 (two) times daily. 07/25/19  Yes Kerin Perna, NP    Scheduled Meds:  aspirin EC  81 mg Oral Daily   enoxaparin (LOVENOX) injection  40 mg Subcutaneous T62U   folic acid  1 mg Intravenous Daily   lidocaine       pantoprazole (PROTONIX) IV  40 mg Intravenous Q12H   [START ON 07/24/2019] thiamine injection  100 mg Intravenous Daily   Infusions:  cefTRIAXone (ROCEPHIN)  IV Stopped (08/15/19 0629)   metronidazole 500 mg (08/15/19 1140)   PRN Meds: albuterol, anti-nausea, HYDROmorphone (DILAUDID) injection, lidocaine (PF), promethazine   Allergies as of 08/13/2019 - Review Complete 08/17/2019  Allergen Reaction Noted   Influenza vaccines Other (See Comments) 04/10/2018   Lipitor [atorvastatin] Other (See Comments) 04/11/2017    Family History  Problem Relation Age of Onset   Diabetes Father    Hypertension Father 75       heart attack   Kidney  disease Father    Heart attack Father    Heart failure Father    Dementia Father    Breast cancer Mother 46       lump removed, bile duct cancer   COPD Mother    Hypertension Brother    Allergic rhinitis Paternal Grandfather     Social History   Socioeconomic History   Marital status: Single    Spouse name: Not on file   Number of children: 1   Years of education: Not on file   Highest education level: High school graduate  Occupational History   Occupation: unemployed    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  Tobacco Use   Smoking status: Current Every Day Smoker    Packs/day: 1.00    Years: 40.00    Pack years: 40.00    Types: Cigarettes   Smokeless tobacco: Never Used  Substance and Sexual Activity   Alcohol use: Yes    Comment: 1-2 glasses a month   Drug use: No   Sexual activity: Not Currently  Other Topics Concern   Not on file  Social History Narrative   Lives with mom   Caffeine- tea during the day       Social Determinants of Health   Financial Resource Strain:    Difficulty of Paying Living Expenses: Not on file  Food Insecurity:    Worried About Charity fundraiser in the Last Year: Not on file   [employer] of Food in the Last Year: Not on file  Transportation Needs:    Lack of Transportation (Medical): Not on file   Lack of Transportation (Non-Medical): Not on file  Physical Activity:    Days of Exercise per Week: Not on file   Minutes of Exercise per Session: Not on file  Stress:    Feeling of Stress : Not on file  Social Connections:    Frequency of Communication with Friends and Family: Not on file   Frequency of Social Gatherings with Friends and Family: Not on file   Attends Religious Services: Not on file   Active Member of Clubs or Organizations: Not on file   Attends Archivist Meetings: Not on file   Marital Status: Not on file  Intimate Partner Violence:  Fear of Current or  Ex-Partner: Not on file   Emotionally Abused: Not on file   Physically Abused: Not on file   Sexually Abused: Not on file    REVIEW OF SYSTEMS: Constitutional: Generally weak.  Feels tired.  Unsteady on her feet and suffers frequent falls. ENT:  No nose bleeds Pulm: Denies shortness of breath or cough. CV:  No palpitations, no chest pain.  Some pedal edema worse on the left foot. GU:  No hematuria, no frequency.  No oliguria GI: See HPI. Heme: Denies excessive bleeding tendency.  Does have a lot of bruises but those are the result of her having lost her balance and falling. Transfusions: On epic review, no documentation of blood product transfusions. Neuro:  No headaches, no seizures, no syncope.  Poor balance, frequent falls.  Lower extremity numbness.  Depending on the day, she can sometimes walk around the house without assistance.  Other days she is unable to get out of bed to walk and that has been the situation for the last several days. Derm:  No itching, no rash or sores.  Endocrine:  No sweats or chills.  No polyuria or dysuria Immunization: No recent vaccinations including flu or COVID-19.  It was after a flu shot in 03/2017 that she was diagnosed with probable GBS. Travel:  None beyond local counties in last few months.    PHYSICAL EXAM: Vital signs in last 24 hours: Vitals:   08/15/19 1215 08/15/19 1230  BP: 97/83 95/79  Pulse: (!) 103 (!) 102  Resp: 15 13  Temp:    SpO2: 98% 95%   Wt Readings from Last 3 Encounters:  08/13/2019 52.2 kg  08/10/19 59.4 kg  07/25/19 67.1 kg    General: Cooperative, acutely and chronically ill looking, pale WF who is slightly uncomfortable.  Looks malnourished Head: No facial asymmetry or swelling.  No signs of head trauma. Eyes: Conjunctiva pale, no scleral icterus.  EOMI Ears: No hearing deficit Nose: No congestion or discharge. Mouth: Somewhat dry oral mucosa but no lesions, exudates.  Tongue midline.  Poor dentition. Neck: No  JVD, no masses, no thyromegaly Lungs: Diminished breath sounds but clear bilaterally.  No cough, no labored breathing. 4 to 5 resolving appearing bruises on her posterior thorax.   Heart: RRR.  No MRG.  S1, S2 present Abdomen: Soft.  Tender without guarding or rebound on the right abdomen.  Bowel sounds hypoactive but no tinkling or tympanic bowel sounds.  No organomegaly, hernias, bruits..   Rectal: Empty rectal vault.  No blood on exam glove, no masses. Musc/Skeltl: No joint redness or gross deformity.  Overall muscular atrophy in the arms and legs. Extremities: Slight, pitting edema in the feet.  Feet are cold but not cyanotic. Neurologic: Able to wiggle her toes and sit up or turn in the bed with minimal assistance.  Did not test limb strength.  Oriented x3. Skin: Somewhat sallow/pale but no jaundice.  No telangiectasia. Tattoos: None observed. Nodes: No cervical adenopathy Psych: Pleasant, cooperative, calm, blunted affect.  Intake/Output from previous day: 02/23 0701 - 02/24 0700 In: -  Out: 2200 [Urine:2200] Intake/Output this shift: Total I/O In: 100 [IV Piggyback:100] Out: 900 [Other:900]  LAB RESULTS: Recent Labs    07/31/2019 2135  WBC 12.0*  HGB 10.8*  HCT 35.0*  PLT 227   BMET Lab Results  Component Value Date   NA 136 08/06/2019   NA 140 08/10/2019   NA 138 08/09/2019   K 3.8 07/29/2019  K 3.6 08/10/2019   K 3.8 08/09/2019   CL 104 08/15/2019   CL 108 08/10/2019   CL 109 08/09/2019   CO2 21 (L) 07/29/2019   CO2 22 08/10/2019   CO2 21 (L) 08/09/2019   GLUCOSE 134 (H) 07/30/2019   GLUCOSE 107 (H) 08/10/2019   GLUCOSE 108 (H) 08/09/2019   BUN <5 (L) 07/23/2019   BUN 5 (L) 08/10/2019   BUN 5 (L) 08/09/2019   CREATININE 0.71 08/15/2019   CREATININE 0.78 08/08/2019   CREATININE 0.71 08/10/2019   CALCIUM 7.7 (L) 08/05/2019   CALCIUM 7.7 (L) 08/10/2019   CALCIUM 7.3 (L) 08/09/2019   LFT Recent Labs    08/18/2019 2135  PROT 5.4*  ALBUMIN 1.5*  AST  47*  ALT 40  ALKPHOS 231*  BILITOT 1.2   PT/INR Lab Results  Component Value Date   INR 1.1 08/15/2019   INR 1.0 08/10/2019   INR 0.89 06/01/2018   Hepatitis Panel No results for input(s): HEPBSAG, HCVAB, HEPAIGM, HEPBIGM in the last 72 hours. C-Diff No components found for: CDIFF Lipase     Component Value Date/Time   LIPASE 13 08/09/2019 2135    Drugs of Abuse     Component Value Date/Time   LABOPIA NONE DETECTED 06/01/2018 0110   COCAINSCRNUR NONE DETECTED 06/01/2018 0110   LABBENZ POSITIVE (A) 06/01/2018 0110   AMPHETMU NONE DETECTED 06/01/2018 0110   THCU POSITIVE (A) 06/01/2018 0110   LABBARB NONE DETECTED 06/01/2018 0110     RADIOLOGY STUDIES: CT ABDOMEN PELVIS W CONTRAST  Result Date: 08/15/2019 CLINICAL DATA:  Right upper quadrant abdominal pain with nausea vomiting and diarrhea. EXAM: CT ABDOMEN AND PELVIS WITH CONTRAST TECHNIQUE: Multidetector CT imaging of the abdomen and pelvis was performed using the standard protocol following bolus administration of intravenous contrast. CONTRAST:  191m OMNIPAQUE IOHEXOL 300 MG/ML  SOLN COMPARISON:  CT abdomen pelvis 08/05/2019 FINDINGS: Lower chest: Interval development of moderate bilateral pleural effusions with adjacent areas of atelectatic change. Additional bandlike areas of subsegmental atelectasis or scarring are noted. Small focus of gas within the atelectatic portion of the left lung periphery (3/4). Hepatobiliary: Diffuse hepatic hypoattenuation which is most pronounced centrally and with sparing along the gallbladder fossa. No focal liver lesion is seen. Mild gallbladder distention without visible calcified gallstones or frank biliary ductal dilatation. Pancreas: Diffuse pancreatic atrophy with multiple areas of pancreatic parenchymal calcification likely suggesting sequela of prior pancreatitis. No ductal dilatation. No focal acute peripancreatic inflammation. Spleen: Normal in size without focal abnormality.  Adrenals/Urinary Tract: Normal adrenal glands. Kidneys enhance symmetrically with delayed excretion on excretory phase imaging. No visible renal lesions. No urolithiasis or hydronephrosis. Urinary bladder is markedly distended at the time of exam without gross wall thickening, debris or bladder calculi. Stomach/Bowel: Circumferential thickening with mucosal hyperemia of the distal thoracic esophagus. Large hiatal hernia containing much of the proximal stomach. There is hyperenhancing gastric mucosa with thickened rugal folds predominantly within the gastric fundus and body. Relative sparing of the antrum. Fluid distended appearance of the duodenum with mild mucosal hyperemia and wall thickening. Proximal jejunum is fluid distended measuring up to 3.2 cm in diameter with focal decompression to more decompressed jejunal loops in the left upper quadrant (3/38). Mild edematous mural thickening is seen throughout the small bowel to the level of the terminal ileum. There is fluid distention of the colon as well with some retained high attenuation contrast media, most compatible with barium likely related 2 swallowing study performed 08/10/2019. Vascular/Lymphatic: Atherosclerotic plaque  throughout the abdominal aorta and branch vessels. Portal and hepatic veins are patent and well opacified. No suspicious or enlarged lymph nodes in the included lymphatic chains. Reproductive: Anteverted uterus. No concerning adnexal lesions. Other: Large volume of simple attenuation ascites in the abdomen as well as extensive severe body wall edema. Additional bilateral pleural effusions and low-attenuation fluid in the mediastinum likely passing through the hiatal hernia. No free air within the abdomen or pelvis. No abdominal wall defect or hernia. Musculoskeletal: Multilevel degenerative changes are present in the imaged portions of the spine. Stable sclerotic compression deformity of T8. As well as additional stable superior endplate  deformities of T11 and L2. Nonunited subacute to remote posterior left eighth through tenth rib fractures IMPRESSION: 1. Interval development of moderate bilateral pleural effusions with adjacent areas of atelectatic change. Small focus of gas within the atelectatic portion of the left lung periphery (08/23/46). This finding is nonspecific and could be seen with infection or air trapping within distal airways. 2. Circumferential thickening with mucosal hyperemia of the distal thoracic esophagus may represent esophagitis with a large hiatal hernia containing much of the proximal stomach. 3. Fluid distended appearance of the duodenum with focal decompression to more decompressed jejunal loops in the left upper quadrant. Mild edematous mural thickening is seen throughout the small bowel to the level of the terminal ileum. Findings are suggestive of infectious or inflammatory enteritis. 4. Large volume simple attenuation ascites in the abdomen as well as extensive body wall edema, features compatible with anasarca. 5. Diffuse hepatic hypoattenuation which is most pronounced centrally and with sparing along the gallbladder fossa. This finding can be seen in the setting of hepatic steatosis. 6. Diffuse pancreatic atrophy with multiple areas of pancreatic parenchymal calcification likely suggesting sequela of prior pancreatitis. No focal acute peripancreatic inflammation. 7. 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 8. Stable sclerotic compression deformity of T8. Additional stable superior endplate deformities of T11 and L2. 9. Aortic Atherosclerosis (ICD10-I70.0). Electronically Signed   By: Lovena Le M.D.   On: 08/15/2019 01:59   Portable chest 1 View  Result Date: 08/15/2019 CLINICAL DATA:  Aspiration after vomiting EXAM: PORTABLE CHEST 1 VIEW COMPARISON:  07/17/2019 FINDINGS: Widened mediastinum which is primarily from hiatal hernia and low lung volumes based on prior CT.  Haziness of the lower chest with pleural effusions and atelectasis by CT. Aspiration could easily be superimposed. No pneumothorax. IMPRESSION: Hazy opacification of the low volume chest correlating with atelectasis and pleural fluid on preceding abdominal CT. There is history of aspiration which could easily be superimposed and obscured. Electronically Signed   By: Monte Fantasia M.D.   On: 08/15/2019 04:44   IR Paracentesis  Result Date: 08/15/2019 INDICATION: Patient with history of COPD, Guillain-Barre, colitis presents to the ED today with complaints of abdominal pain nausea and vomiting. Found to have new onset ascites with concern for spontaneous bacterial peritonitis, request to IR for diagnostic and therapeutic paracentesis. EXAM: ULTRASOUND GUIDED DIAGNOSTIC AND THERAPEUTIC PARACENTESIS MEDICATIONS: 10 mL 1% lidocaine COMPLICATIONS: None immediate. PROCEDURE: Informed written consent was obtained from the patient after a discussion of the risks, benefits and alternatives to treatment. A timeout was performed prior to the initiation of the procedure. Initial ultrasound scanning demonstrates a small amount of ascites within the left lower abdominal quadrant. The left lower abdomen was prepped and draped in the usual sterile fashion. 1% lidocaine was used for local anesthesia. Following this, a 6 Fr Safe-T-Centesis catheter was introduced. An  ultrasound image was saved for documentation purposes. The paracentesis was performed. The catheter was removed and a dressing was applied. The patient tolerated the procedure well without immediate post procedural complication. Patient received post-procedure intravenous albumin; see nursing notes for details. FINDINGS: A total of approximately 900 mL of clear, very pale yellow fluid was removed. Samples were sent to the laboratory as requested by the clinical team. IMPRESSION: Successful ultrasound-guided paracentesis yielding 900 mL of peritoneal fluid. Read by  Candiss Norse, PA-C Electronically Signed   By: Corrie Mckusick D.O.   On: 08/15/2019 10:06     IMPRESSION:   *   Enteritis/colitis.  Several weeks n/v, abd bloating, right abd pain.   CT scan 12 days ago showed what looked like colitis and this has now progressed into small bowel.  *     New onset ascites.  900 mL paracentesis this morning 2/24.  No SBP on fluid studies.  Fluid albumin  <1.  Serum ascites gradient ~ 0.6, not c/w portal hypertension.   *   Chronic calcific pancreatitis.  No recent episodes of acute pancreatitis.  Wonder if malabsorption is contributing to her malnutrition and loose stools.  *    Fatty liver.  Suspect she may drink more vodka than she admits.  No definitive diagnosis of cirrhosis.  Intermittent thrombocytopenia but normal INR.    *   Protein cal malnutrition, suspect severe.  With anasarca.  This may be causing edema in SB and colon as well as the ascites.    *      Hx chronic constipation.  Unremarkable colonoscopy 06/2017 did not confirm CT s/o descending colon mass. Patient has chronic diarrhea but she also is taking Linzess.  ? role of pancreatic exocrine insufficiency.    *    Macrocytic anemia.  Labs within the last couple of weeks did not confirm iron, B12 or folate deficiency.  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  Improved from near quad to intermittent ambulation.      PLAN:     *   Pre-albumin, ammonia level now.  *   allow clear liquids.  N.p.o. after midnight to allow for enteroscopy tomorrow morning.    *   Empiric Rocephin, Flagyl in place. Protonix 40 mg IV in place       Azucena Freed  08/15/2019, 12:49 PM Phone [phone]

## 2019-08-15 NOTE — Progress Notes (Signed)
Subjective:  Patient seen at bedside this AM. Patient states she was doing well initially after discharge but her symptoms returned and worsened. States she was worked up for cirrhosis before and told she did not have it.   Objective:    Vital Signs (last 24 hours): Vitals:   08/15/19 0630 08/15/19 0700 08/15/19 0915 08/15/19 0923  BP: 103/84 94/79 97/65  98/72  Pulse: (!) 107 (!) 106    Resp: 16 13    Temp:      TempSrc:      SpO2: 96% 97%    Weight:      Height:        Physical Exam: General Alert and answers questions appropriately, no acute distress  Cardiac Regular rate and rhythm, no murmurs, rubs, or gallops  Pulmonary Clear to auscultation bilaterally without wheezes, rhonchi, or rales  Abdominal Soft, mild tenderness to palpation, without significant distention  Extremities No peripheral edema   CT Abdomen/Pelvis W Contrast (08/15/19): IMPRESSION: 1. Interval development of moderate bilateral pleural effusions with adjacent areas of atelectatic change. Small focus of gas within the atelectatic portion of the left lung periphery ([birth year]). This finding is nonspecific and could be seen with infection or air trapping within distal airways. 2. Circumferential thickening with mucosal hyperemia of the distal thoracic esophagus may represent esophagitis with a large hiatal hernia containing much of the proximal stomach. 3. Fluid distended appearance of the duodenum with focal decompression to more decompressed jejunal loops in the left upper quadrant. Mild edematous mural thickening is seen throughout the small bowel to the level of the terminal ileum. Findings are suggestive of infectious or inflammatory enteritis. 4. Large volume simple attenuation ascites in the abdomen as well as extensive body wall edema, features compatible with anasarca. 5. Diffuse hepatic hypoattenuation which is most pronounced centrally and with sparing along the gallbladder fossa. This  finding can be seen in the setting of hepatic steatosis. 6. Diffuse pancreatic atrophy with multiple areas of pancreatic parenchymal calcification likely suggesting sequela of prior pancreatitis. No focal acute peripancreatic inflammation. 7. 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 8. Stable sclerotic compression deformity of T8. Additional stable superior endplate deformities of T11 and L2. 9. Aortic Atherosclerosis (ICD10-I70.0).   Assessment/Plan:   Active Problems:   Colitis   SBP (spontaneous bacterial peritonitis) [facility])   Hematochezia  Patient is a 59 year old female with past medical history significant for COPD, hypertension, AIDP and recent hospital mission for colitis (2/14-2/19) who presented on 08/01/2019 with abdominal pain, nausea, and vomiting.  # Ascites # Colitis # Hematochezia CT abdomen/pelvis demonstrates inflammation of distal thoracic esophagus which may represent esophagitis with large hiatal hernia.  There is mild edematous mural thickening throughout the small bowel consistent with infectious versus inflammatory enteritis.  There is also a large volume ascites in the abdomen which is new from prior CT.  There are findings suggesting of hepatic steatosis as well as prior pancreatitis. No lab abnormalities consistent with cirrhosis, INR is 1.1 and platelets of 227, doppler ultrasound from December 2020 demonstrated normal direction of blood flow towards liver. Patient has variable alcohol history. Alkaline phosphatase elevated to 231, AST/ALT without significant elevation, no biliary duct dilation on last ultrasound, AMA WNL.  Patient's fecal occult blood is positive.  Patient treated empirically for infectious colitis with ceftriaxone + Flagyl. There is concern for infectious vs inflammatory colitis vs. SBP. *Patient had IR paracentesis performed this morning draining 900 cc fluid with protein < 3.0, albumin <  1.0. Serum albumin  is low at 1.5, total protein 5.4.  Cell count, gram stain, cultures sent *Continue ceftriaxone 2 g daily + metronidazole 500 mg every 8 hours *GI consulted, we appreciate their recommendations *Pantoprazole 40 mg every 12 hours *Emetrol 10 mL Q15 minutes nausea/vomiting, phenergan 12.5 mg Q6HR PRN for nausea/vomiting, will monitor EKG, continue telemetry given QTc *Dilaudid 0.5 mg Q4HR PRN for moderate-severe pain *Followup blood cultures *Continue telemetry  # COPD # Bilateral pleural effusions CT abdomen/pelvis demonstrated bilateral pleural effusions, likely secondary to intra-abdominal process.  Chest x-ray shows hazy opacification consistent with atelectasis and pleural fluid. Patient is saturating well, breathing comfortably on room air.  # Urinary retention: Patient with >1 L on initial bladder scan, In&Out caths at home. Foley placed in ER. Will consider In/Out cath when patient on floor for infection risk  # Left calf pain: DVT ultrasound pending  # Alcohol ENM:[id number] with unclear alcohol use history. Denies recent consumption *Continue IV thiamine 100 mg daily *Continue IV folate 1 g daily  PT/OT: Will consult when patient improved Diet: NPO, sponges for moisture DVT Ppx: Lovenox 40 mg daily Admit Status: Inpatient Dispo: Anticipated discharge pending clinical improvement  Jeanmarie Hubert, MD 08/15/2019, 10:47 AM

## 2019-08-15 NOTE — Consult Note (Addendum)
[facility] Consult: 12:49 PM 08/15/2019  LOS: 0 days     Referring Provider: Dr. Rebeca Alert Primary Care Physician:  Kerin Perna, NP Primary Gastroenterologist:  Dr Ardis Hughs    Reason for Consultation:  Progression colitis, new ascites   HPI: Catherine Butler is a 59 y.o. female.  PMH COPD.  Alcohol abuse.  Hypertension.  Depression.  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  Urinary retention.  Thrombocytopenia, platelets 140 K on 07/17/2019.  Fatty liver, elevated LFTs in 12/2016.  Alcoholic hepatitis 11/43.  Hiatal hernia, marked fatty liver, ? Acute on chronic calcific pancreatitis and ? occlusive stone in PD on CT 03/2017.  Did not tolerate attempt at MRCP in 03/2017.  Constipation, historically BM's q 10 to 14 days.  Trial of Linzess following 06/2017 colonoscopy caused fecal urgency, liquid stools.  Trial Amitiza 06/2018 inefective, resumed Linzess.  Now has 3 to 5 liquid BM's/day, less or none if skips Linzess.  06/2017 colonoscopy for abnml CT suggesting possible mass in descending colon, chronic constipation.  The colonoscopy was entirely normal to the IC valve.  Admission 12/11 -06/04/2027 with urinary retention Admission 08/05/2019 -08/10/2019 with intractable N/V.  Hypokalemia, hypomagnesemia. Alk phos at 342.  Macrocytic anemia.  Urinary retention.  06/02/2019 abdominal ultrasound showed fatty liver.  08/05/2019 CTAP with contrast showed decompressed colon with hyperenhancing mucosa and diffuse wall edema suggesting colitis, mild reactive free fluid, fatty liver.  Discharged on esomeprazole 40 mg daily, Linzess 145 mcg daily, prn promethazine  Called EMS for transport to ED for persistent abd swelling since before xmas 2020,  right abd pain, n/v of initially clear then brown emesis 2 to 10 x daily.  10 # wt drop per pt estimate.  Urinary output ok: at least 5 x daily.  Drinks Vodka, 750 mL/week on average, sometimes less.  Last Vodka was 2/20.  Pill dysphagia.  No bloody emesis or BM's.     Hb 10.8, MCV 109.  WBCs 12. Anemia profile in early 05/2019 without iron, folate or B12 deficiency. T bili 1.2.  AST/ALT 47/40 alk phos 231 (342 on 06/02/19).  Albumin 1.5 Lipase wnl now and at recent admission.    CTAP with contrast: Bilateral pleural effusions and atelectasis.  Nonspecific, focal focus of gas within atelectasis in the peripheral left lung.  Thickening in the distal esophagus, ?  Esophagitis.  Large HH containing bulk of the proximal stomach.  Gastric wall thick, though antrum spared.   Duodenum distended w fluid, wall thickened.  prox jejunum filled w fluid, w focal decompression in jejunum in LUQ.  Diffuse wall thickening throughout SB to TI, suff of infectious or inflammatory enteritis.  Diffuse hepatic steatosis.  Diffuse pancreatic atrophy with extensive calcification, no acute pancreatitis.  Large volume abdominal ascites and extensive body wall edema c/w anasarca.  Diffuse hepatic hypoattenuation possibly representing hepatic steatosis. Nonacute left rib fractures.  Deformities of thoracic and lumbar spine.  Underwent 900 mL paracentesis this morning.  Fluid studies do not support diagnosis of  SBP, no WBCs seen, no organisms on Gram stain.  Family history Mother died of old age around 22 but when she was around 80 underwent surgery and chemo for cancer of the bile duct.  Prostate cancer in uncles.  No colon cancer.  No Crohn's disease or ulcerative colitis.  Social history Lives with her son.  Alcohol intake about 750 mL vodka/week.  1 pack cigarettes/day.  Past Medical History:  Diagnosis Date  . Anxiety   . Arthritis   . Asthma   . Complication of anesthesia    nausea  . COPD (chronic obstructive  pulmonary disease) ([place])   . Depression   . Guillain-Barre ([place]) 2018  . H/O alcohol abuse   . Hypertension    pt denies  . Nausea & vomiting 07/2019  . PONV (postoperative nausea and vomiting)   . Reflux   . Suicide attempt by multiple drug overdose ([place]) 2014    Past Surgical History:  Procedure Laterality Date  . COLONOSCOPY WITH PROPOFOL N/A 07/21/2017   Procedure: COLONOSCOPY WITH PROPOFOL;  Surgeon: Milus Banister, MD;  Location: [facility];  Service: Endoscopy;  Laterality: N/A;  . IR PARACENTESIS  08/15/2019  . LAPAROSCOPY    . laporscopy surgery for ovarian cyst  20 years ago  . TONSILLECTOMY    . TUBAL LIGATION      Prior to Admission medications   Medication Sig Start Date End Date Taking? Authorizing Provider  acetaminophen (TYLENOL) 500 MG tablet Take 1 tablet (500 mg total) by mouth every 4 (four) hours as needed for headache. 08/10/19  Yes Jeanmarie Hubert, MD  albuterol (PROVENTIL HFA;VENTOLIN HFA) 108 (90 Base) MCG/ACT inhaler Inhale 2 puffs into the lungs every 6 (six) hours as needed for wheezing. 08/30/18  Yes Kerin Perna, NP  albuterol (PROVENTIL) (2.5 MG/3ML) 0.083% nebulizer solution Take 3 mLs (2.5 mg total) by nebulization every 6 (six) hours as needed for wheezing or shortness of breath. 08/30/18  Yes Kerin Perna, NP  aspirin EC 81 MG tablet Take 1 tablet (81 mg total) by mouth daily. 03/28/18  Yes Clent Demark, PA-C  DULoxetine (CYMBALTA) 30 MG capsule Take 1 capsule (30 mg total) by mouth daily. 05/29/19  Yes Kerin Perna, NP  esomeprazole (NEXIUM) 40 MG capsule TAKE 1 CAPSULE BY MOUTH ONCE DAILY BEFORE BREAKFAST Patient taking differently: Take 40 mg by mouth daily. Before breakfast 06/07/19  Yes Milus Banister, MD  feeding supplement, ENSURE ENLIVE, (ENSURE ENLIVE) LIQD Take 237 mLs by mouth 3 (three) times daily between meals. 08/10/19  Yes Jeanmarie Hubert, MD  gabapentin (NEURONTIN) 600 MG tablet Take 2 tablets by mouth  three times a day 05/29/19  Yes Kerin Perna, NP  ibuprofen (ADVIL,MOTRIN) 200 MG tablet Take 400 mg by mouth every 6 (six) hours as needed for fever, headache, mild pain, moderate pain or cramping.   Yes [provider]  LINZESS 145 MCG CAPS capsule TAKE 1 CAPSULE BY MOUTH ONCE DAILY BEFORE BREAKFAST. TAKE ON AN EMPTY STOMACH. Patient taking differently: Take 145 mcg by mouth daily before breakfast.  05/23/19  Yes Willia Craze, NP  mirtazapine (REMERON) 15 MG tablet Take 15 mg by mouth at bedtime.   Yes [provider]  Multiple Vitamin (MULTIVITAMIN WITH MINERALS) TABS tablet Take 1 tablet by mouth daily. 08/11/19  Yes Jeanmarie Hubert, MD  promethazine (PHENERGAN) 6.25 MG/5ML syrup Take 20 mLs (25 mg total) by mouth every 6 (six) hours as needed for nausea or  vomiting. 08/10/19 08/09/20 Yes Jeanmarie Hubert, MD  QUEtiapine (SEROQUEL) 400 MG tablet Take 400 mg by mouth at bedtime.   Yes [provider]  triamcinolone (KENALOG) 0.1 % paste Use as directed 1 application in the mouth or throat 2 (two) times daily. 07/25/19  Yes Kerin Perna, NP    Scheduled Meds: . aspirin EC  81 mg Oral Daily  . enoxaparin (LOVENOX) injection  40 mg Subcutaneous Q24H  . folic acid  1 mg Intravenous Daily  . lidocaine      . pantoprazole (PROTONIX) IV  40 mg Intravenous Q12H  . [START ON 08/06/2019] thiamine injection  100 mg Intravenous Daily   Infusions: . cefTRIAXone (ROCEPHIN)  IV Stopped (08/15/19 0629)  . metronidazole 500 mg (08/15/19 1140)   PRN Meds: albuterol, anti-nausea, HYDROmorphone (DILAUDID) injection, lidocaine (PF), promethazine   Allergies as of 08/12/2019 - Review Complete 08/12/2019  Allergen Reaction Noted  . Influenza vaccines Other (See Comments) 04/10/2018  . Lipitor [atorvastatin] Other (See Comments) 04/11/2017    Family History  Problem Relation Age of Onset  . Diabetes Father   . Hypertension Father 37       heart attack  . Kidney  disease Father   . Heart attack Father   . Heart failure Father   . Dementia Father   . Breast cancer Mother 30       lump removed, bile duct cancer  . COPD Mother   . Hypertension Brother   . Allergic rhinitis Paternal Grandfather     Social History   Socioeconomic History  . Marital status: Single    Spouse name: Not on file  . Number of children: 1  . Years of education: Not on file  . Highest education level: High school graduate  Occupational History  . Occupation: unemployed    Comment: prior Veterinary surgeon  Tobacco Use  . Smoking status: Current Every Day Smoker    Packs/day: 1.00    Years: 40.00    Pack years: 40.00    Types: Cigarettes  . Smokeless tobacco: Never Used  Substance and Sexual Activity  . Alcohol use: Yes    Comment: 1-2 glasses a month  . Drug use: No  . Sexual activity: Not Currently  Other Topics Concern  . Not on file  Social History Narrative   Lives with mom   Caffeine- tea during the day       Social Determinants of Health   Financial Resource Strain:   . Difficulty of Paying Living Expenses: Not on file  Food Insecurity:   . Worried About Charity fundraiser in the Last Year: Not on file  . Ran Out of Food in the Last Year: Not on file  Transportation Needs:   . Lack of Transportation (Medical): Not on file  . Lack of Transportation (Non-Medical): Not on file  Physical Activity:   . Days of Exercise per Week: Not on file  . Minutes of Exercise per Session: Not on file  Stress:   . Feeling of Stress : Not on file  Social Connections:   . Frequency of Communication with Friends and Family: Not on file  . Frequency of Social Gatherings with Friends and Family: Not on file  . Attends Religious Services: Not on file  . Active Member of Clubs or Organizations: Not on file  . Attends Archivist Meetings: Not on file  . Marital Status: Not on file  Intimate Partner Violence:   .  Fear of Current or  Ex-Partner: Not on file  . Emotionally Abused: Not on file  . Physically Abused: Not on file  . Sexually Abused: Not on file    REVIEW OF SYSTEMS: Constitutional: Generally weak.  Feels tired.  Unsteady on her feet and suffers frequent falls. ENT:  No nose bleeds Pulm: Denies shortness of breath or cough. CV:  No palpitations, no chest pain.  Some pedal edema worse on the left foot. GU:  No hematuria, no frequency.  No oliguria GI: See HPI. Heme: Denies excessive bleeding tendency.  Does have a lot of bruises but those are the result of her having lost her balance and falling. Transfusions: On epic review, no documentation of blood product transfusions. Neuro:  No headaches, no seizures, no syncope.  Poor balance, frequent falls.  Lower extremity numbness.  Depending on the day, she can sometimes walk around the house without assistance.  Other days she is unable to get out of bed to walk and that has been the situation for the last several days. Derm:  No itching, no rash or sores.  Endocrine:  No sweats or chills.  No polyuria or dysuria Immunization: No recent vaccinations including flu or COVID-19.  It was after a flu shot in 03/2017 that she was diagnosed with probable GBS. Travel:  None beyond local counties in last few months.    PHYSICAL EXAM: Vital signs in last 24 hours: Vitals:   08/15/19 1215 08/15/19 1230  BP: 97/83 95/79  Pulse: (!) 103 (!) 102  Resp: 15 13  Temp:    SpO2: 98% 95%   Wt Readings from Last 3 Encounters:  07/25/2019 52.2 kg  08/10/19 59.4 kg  07/25/19 67.1 kg    General: Cooperative, acutely and chronically ill looking, pale WF who is slightly uncomfortable.  Looks malnourished Head: No facial asymmetry or swelling.  No signs of head trauma. Eyes: Conjunctiva pale, no scleral icterus.  EOMI Ears: No hearing deficit Nose: No congestion or discharge. Mouth: Somewhat dry oral mucosa but no lesions, exudates.  Tongue midline.  Poor dentition. Neck: No  JVD, no masses, no thyromegaly Lungs: Diminished breath sounds but clear bilaterally.  No cough, no labored breathing. 4 to 5 resolving appearing bruises on her posterior thorax.   Heart: RRR.  No MRG.  S1, S2 present Abdomen: Soft.  Tender without guarding or rebound on the right abdomen.  Bowel sounds hypoactive but no tinkling or tympanic bowel sounds.  No organomegaly, hernias, bruits..   Rectal: Empty rectal vault.  No blood on exam glove, no masses. Musc/Skeltl: No joint redness or gross deformity.  Overall muscular atrophy in the arms and legs. Extremities: Slight, pitting edema in the feet.  Feet are cold but not cyanotic. Neurologic: Able to wiggle her toes and sit up or turn in the bed with minimal assistance.  Did not test limb strength.  Oriented x3. Skin: Somewhat sallow/pale but no jaundice.  No telangiectasia. Tattoos: None observed. Nodes: No cervical adenopathy Psych: Pleasant, cooperative, calm, blunted affect.  Intake/Output from previous day: 02/23 0701 - 02/24 0700 In: -  Out: 2200 [Urine:2200] Intake/Output this shift: Total I/O In: 100 [IV Piggyback:100] Out: 900 [Other:900]  LAB RESULTS: Recent Labs    08/17/2019 2135  WBC 12.0*  HGB 10.8*  HCT 35.0*  PLT 227   BMET Lab Results  Component Value Date   NA 136 07/30/2019   NA 140 08/10/2019   NA 138 08/09/2019   K 3.8 07/24/2019  K 3.6 08/10/2019   K 3.8 08/09/2019   CL 104 07/25/2019   CL 108 08/10/2019   CL 109 08/09/2019   CO2 21 (L) 08/08/2019   CO2 22 08/10/2019   CO2 21 (L) 08/09/2019   GLUCOSE 134 (H) 07/24/2019   GLUCOSE 107 (H) 08/10/2019   GLUCOSE 108 (H) 08/09/2019   BUN <5 (L) 08/01/2019   BUN 5 (L) 08/10/2019   BUN 5 (L) 08/09/2019   CREATININE 0.71 08/15/2019   CREATININE 0.78 08/15/2019   CREATININE 0.71 08/10/2019   CALCIUM 7.7 (L) 08/06/2019   CALCIUM 7.7 (L) 08/10/2019   CALCIUM 7.3 (L) 08/09/2019   LFT Recent Labs    08/10/2019 2135  PROT 5.4*  ALBUMIN 1.5*  AST  47*  ALT 40  ALKPHOS 231*  BILITOT 1.2   PT/INR Lab Results  Component Value Date   INR 1.1 08/15/2019   INR 1.0 08/10/2019   INR 0.89 06/01/2018   Hepatitis Panel No results for input(s): HEPBSAG, HCVAB, HEPAIGM, HEPBIGM in the last 72 hours. C-Diff No components found for: CDIFF Lipase     Component Value Date/Time   LIPASE 13 07/23/2019 2135    Drugs of Abuse     Component Value Date/Time   LABOPIA NONE DETECTED 06/01/2018 0110   COCAINSCRNUR NONE DETECTED 06/01/2018 0110   LABBENZ POSITIVE (A) 06/01/2018 0110   AMPHETMU NONE DETECTED 06/01/2018 0110   THCU POSITIVE (A) 06/01/2018 0110   LABBARB NONE DETECTED 06/01/2018 0110     RADIOLOGY STUDIES: CT ABDOMEN PELVIS W CONTRAST  Result Date: 08/15/2019 CLINICAL DATA:  Right upper quadrant abdominal pain with nausea vomiting and diarrhea. EXAM: CT ABDOMEN AND PELVIS WITH CONTRAST TECHNIQUE: Multidetector CT imaging of the abdomen and pelvis was performed using the standard protocol following bolus administration of intravenous contrast. CONTRAST:  130m OMNIPAQUE IOHEXOL 300 MG/ML  SOLN COMPARISON:  CT abdomen pelvis 08/05/2019 FINDINGS: Lower chest: Interval development of moderate bilateral pleural effusions with adjacent areas of atelectatic change. Additional bandlike areas of subsegmental atelectasis or scarring are noted. Small focus of gas within the atelectatic portion of the left lung periphery (3/4). Hepatobiliary: Diffuse hepatic hypoattenuation which is most pronounced centrally and with sparing along the gallbladder fossa. No focal liver lesion is seen. Mild gallbladder distention without visible calcified gallstones or frank biliary ductal dilatation. Pancreas: Diffuse pancreatic atrophy with multiple areas of pancreatic parenchymal calcification likely suggesting sequela of prior pancreatitis. No ductal dilatation. No focal acute peripancreatic inflammation. Spleen: Normal in size without focal abnormality.  Adrenals/Urinary Tract: Normal adrenal glands. Kidneys enhance symmetrically with delayed excretion on excretory phase imaging. No visible renal lesions. No urolithiasis or hydronephrosis. Urinary bladder is markedly distended at the time of exam without gross wall thickening, debris or bladder calculi. Stomach/Bowel: Circumferential thickening with mucosal hyperemia of the distal thoracic esophagus. Large hiatal hernia containing much of the proximal stomach. There is hyperenhancing gastric mucosa with thickened rugal folds predominantly within the gastric fundus and body. Relative sparing of the antrum. Fluid distended appearance of the duodenum with mild mucosal hyperemia and wall thickening. Proximal jejunum is fluid distended measuring up to 3.2 cm in diameter with focal decompression to more decompressed jejunal loops in the left upper quadrant (3/38). Mild edematous mural thickening is seen throughout the small bowel to the level of the terminal ileum. There is fluid distention of the colon as well with some retained high attenuation contrast media, most compatible with barium likely related 2 swallowing study performed 08/10/2019. Vascular/Lymphatic: Atherosclerotic plaque  throughout the abdominal aorta and branch vessels. Portal and hepatic veins are patent and well opacified. No suspicious or enlarged lymph nodes in the included lymphatic chains. Reproductive: Anteverted uterus. No concerning adnexal lesions. Other: Large volume of simple attenuation ascites in the abdomen as well as extensive severe body wall edema. Additional bilateral pleural effusions and low-attenuation fluid in the mediastinum likely passing through the hiatal hernia. No free air within the abdomen or pelvis. No abdominal wall defect or hernia. Musculoskeletal: Multilevel degenerative changes are present in the imaged portions of the spine. Stable sclerotic compression deformity of T8. As well as additional stable superior endplate  deformities of T11 and L2. Nonunited subacute to remote posterior left eighth through tenth rib fractures IMPRESSION: 1. Interval development of moderate bilateral pleural effusions with adjacent areas of atelectatic change. Small focus of gas within the atelectatic portion of the left lung periphery (08/23/46). This finding is nonspecific and could be seen with infection or air trapping within distal airways. 2. Circumferential thickening with mucosal hyperemia of the distal thoracic esophagus may represent esophagitis with a large hiatal hernia containing much of the proximal stomach. 3. Fluid distended appearance of the duodenum with focal decompression to more decompressed jejunal loops in the left upper quadrant. Mild edematous mural thickening is seen throughout the small bowel to the level of the terminal ileum. Findings are suggestive of infectious or inflammatory enteritis. 4. Large volume simple attenuation ascites in the abdomen as well as extensive body wall edema, features compatible with anasarca. 5. Diffuse hepatic hypoattenuation which is most pronounced centrally and with sparing along the gallbladder fossa. This finding can be seen in the setting of hepatic steatosis. 6. Diffuse pancreatic atrophy with multiple areas of pancreatic parenchymal calcification likely suggesting sequela of prior pancreatitis. No focal acute peripancreatic inflammation. 7. 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 8. Stable sclerotic compression deformity of T8. Additional stable superior endplate deformities of T11 and L2. 9. Aortic Atherosclerosis (ICD10-I70.0). Electronically Signed   By: Lovena Le M.D.   On: 08/15/2019 01:59   Portable chest 1 View  Result Date: 08/15/2019 CLINICAL DATA:  Aspiration after vomiting EXAM: PORTABLE CHEST 1 VIEW COMPARISON:  07/17/2019 FINDINGS: Widened mediastinum which is primarily from hiatal hernia and low lung volumes based on prior CT.  Haziness of the lower chest with pleural effusions and atelectasis by CT. Aspiration could easily be superimposed. No pneumothorax. IMPRESSION: Hazy opacification of the low volume chest correlating with atelectasis and pleural fluid on preceding abdominal CT. There is history of aspiration which could easily be superimposed and obscured. Electronically Signed   By: Monte Fantasia M.D.   On: 08/15/2019 04:44   IR Paracentesis  Result Date: 08/15/2019 INDICATION: Patient with history of COPD, Guillain-Barre, colitis presents to the ED today with complaints of abdominal pain nausea and vomiting. Found to have new onset ascites with concern for spontaneous bacterial peritonitis, request to IR for diagnostic and therapeutic paracentesis. EXAM: ULTRASOUND GUIDED DIAGNOSTIC AND THERAPEUTIC PARACENTESIS MEDICATIONS: 10 mL 1% lidocaine COMPLICATIONS: None immediate. PROCEDURE: Informed written consent was obtained from the patient after a discussion of the risks, benefits and alternatives to treatment. A timeout was performed prior to the initiation of the procedure. Initial ultrasound scanning demonstrates a small amount of ascites within the left lower abdominal quadrant. The left lower abdomen was prepped and draped in the usual sterile fashion. 1% lidocaine was used for local anesthesia. Following this, a 6 Fr Safe-T-Centesis catheter was introduced. An  ultrasound image was saved for documentation purposes. The paracentesis was performed. The catheter was removed and a dressing was applied. The patient tolerated the procedure well without immediate post procedural complication. Patient received post-procedure intravenous albumin; see nursing notes for details. FINDINGS: A total of approximately 900 mL of clear, very pale yellow fluid was removed. Samples were sent to the laboratory as requested by the clinical team. IMPRESSION: Successful ultrasound-guided paracentesis yielding 900 mL of peritoneal fluid. Read by  Candiss Norse, PA-C Electronically Signed   By: Corrie Mckusick D.O.   On: 08/15/2019 10:06     IMPRESSION:   *   Enteritis/colitis.  Several weeks n/v, abd bloating, right abd pain.   CT scan 12 days ago showed what looked like colitis and this has now progressed into small bowel.  *     New onset ascites.  900 mL paracentesis this morning 2/24.  No SBP on fluid studies.  Fluid albumin  <1.  Serum ascites gradient ~ 0.6, not c/w portal hypertension.   *   Chronic calcific pancreatitis.  No recent episodes of acute pancreatitis.  Wonder if malabsorption is contributing to her malnutrition and loose stools.  *    Fatty liver.  Suspect she may drink more vodka than she admits.  No definitive diagnosis of cirrhosis.  Intermittent thrombocytopenia but normal INR.    *   Protein cal malnutrition, suspect severe.  With anasarca.  This may be causing edema in SB and colon as well as the ascites.    *      Hx chronic constipation.  Unremarkable colonoscopy 06/2017 did not confirm CT s/o descending colon mass. Patient has chronic diarrhea but she also is taking Linzess.  ? role of pancreatic exocrine insufficiency.    *    Macrocytic anemia.  Labs within the last couple of weeks did not confirm iron, B12 or folate deficiency.  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  Improved from near quad to intermittent ambulation.      PLAN:     *   Pre-albumin, ammonia level now.  *   allow clear liquids.  N.p.o. after midnight to allow for enteroscopy tomorrow morning.    *   Empiric Rocephin, Flagyl in place. Protonix 40 mg IV in place       Azucena Freed  08/15/2019, 12:49 PM Phone [phone]

## 2019-08-15 NOTE — H&P (Addendum)
Date: 08/15/2019               Patient Name:  Catherine Butler MRN: [id number]  DOB: Oct 20, 1960 Age / Sex: 59 y.o., female   PCP: Catherine Perna, NP         Medical Service: Internal Medicine Teaching Service         Attending Physician: Dr. Veryl Speak, MD    First Contact: Benjamine Mola, MD, [place] Key Pager: MM [phone])  Second Contact: Maricela Bo, MD, [place] Pager: Tracie Harrier [phone])       After Hours (After 5p/  First Contact Pager: [phone]  weekends / holidays): Second Contact Pager: [phone]   Chief Complaint: Abdominal pain  History of Present Illness:  History was supplemented by chart review and a phone discussion with the patient's son.   Catherine Butler is a 59 year old female with a past medical history significant for COPD, HTN, Guillan Aris Lot and recent hospital admission for colitis (2/14-2/19) who presents today with abdominal pain, nausea and vomiting.  After her recent hospitalization, the patient has not been able to eat much food at all. The patient is also been weak as well, and had a fall (did not hit head or lose consciousness) 2 days ago.  She fell on her right side and had been complaining about that to her son.  The son does endorse that she has been confused and seeing things that are not there since coming home.  She states that she saw an orange cat inside the house. Overall, her confusion and decline in functional status has been ongoing for the last year.  She has not been having any fevers, chest pain, shortness of breath, or diarrhea.   In the ED, CBC was notable for leukocytosis to 12.0 and hemoglobin 10.8.  Lipase normal.  CMP demonstrated a low albumin to 1.5 and elevated alk phos to 231. Patient had a normal BUN and creatinine. Gastric occult blood positive.  Meds:  Current Meds  Medication Sig  . acetaminophen (TYLENOL) 500 MG tablet Take 1 tablet (500 mg total) by mouth every 4 (four) hours as needed for headache.  . albuterol (PROVENTIL HFA;VENTOLIN  HFA) 108 (90 Base) MCG/ACT inhaler Inhale 2 puffs into the lungs every 6 (six) hours as needed for wheezing.  Marland Kitchen albuterol (PROVENTIL) (2.5 MG/3ML) 0.083% nebulizer solution Take 3 mLs (2.5 mg total) by nebulization every 6 (six) hours as needed for wheezing or shortness of breath.  Marland Kitchen aspirin EC 81 MG tablet Take 1 tablet (81 mg total) by mouth daily.  . DULoxetine (CYMBALTA) 30 MG capsule Take 1 capsule (30 mg total) by mouth daily.  Marland Kitchen esomeprazole (NEXIUM) 40 MG capsule TAKE 1 CAPSULE BY MOUTH ONCE DAILY BEFORE BREAKFAST (Patient taking differently: Take 40 mg by mouth daily. Before breakfast)  . feeding supplement, ENSURE ENLIVE, (ENSURE ENLIVE) LIQD Take 237 mLs by mouth 3 (three) times daily between meals.  . gabapentin (NEURONTIN) 600 MG tablet Take 2 tablets by mouth three times a day  . ibuprofen (ADVIL,MOTRIN) 200 MG tablet Take 400 mg by mouth every 6 (six) hours as needed for fever, headache, mild pain, moderate pain or cramping.  Marland Kitchen LINZESS 145 MCG CAPS capsule TAKE 1 CAPSULE BY MOUTH ONCE DAILY BEFORE BREAKFAST. TAKE ON AN EMPTY STOMACH. (Patient taking differently: Take 145 mcg by mouth daily before breakfast. )  . mirtazapine (REMERON) 15 MG tablet Take 15 mg by mouth at bedtime.  . Multiple Vitamin (MULTIVITAMIN WITH MINERALS) TABS tablet Take  1 tablet by mouth daily.  . promethazine (PHENERGAN) 6.25 MG/5ML syrup Take 20 mLs (25 mg total) by mouth every 6 (six) hours as needed for nausea or vomiting.  Marland Kitchen QUEtiapine (SEROQUEL) 400 MG tablet Take 400 mg by mouth at bedtime.  . triamcinolone (KENALOG) 0.1 % paste Use as directed 1 application in the mouth or throat 2 (two) times daily.   Allergies: Allergies as of 07/23/2019 - Review Complete 08/10/2019  Allergen Reaction Noted  . Influenza vaccines Other (See Comments) 04/10/2018  . Lipitor [atorvastatin] Other (See Comments) 04/11/2017   Past Medical History:  Diagnosis Date  . Anxiety   . Arthritis   . Asthma   . Complication  of anesthesia    nausea  . COPD (chronic obstructive pulmonary disease) ([place])   . Depression   . 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 H/O alcohol abuse   . Hypertension    pt denies  . Nausea & vomiting 07/2019  . PONV (postoperative nausea and vomiting)   . Reflux   . Suicide attempt by multiple drug overdose ([place]) 2014   Past Surgical History:  Procedure Laterality Date  . COLONOSCOPY WITH PROPOFOL N/A 07/21/2017   Procedure: COLONOSCOPY WITH PROPOFOL;  Surgeon: Milus Banister, MD;  Location: [facility];  Service: Endoscopy;  Laterality: N/A;  . LAPAROSCOPY    . laporscopy surgery for ovarian cyst  20 years ago  . TONSILLECTOMY    . TUBAL LIGATION     Family History:  Family History  Problem Relation Age of Onset  . Diabetes Father   . Hypertension Father 66       heart attack  . Kidney disease Father   . Heart attack Father   . Heart failure Father   . Dementia Father   . Breast cancer Mother 30       lump removed, bile duct cancer  . COPD Mother   . Hypertension Brother   . Allergic rhinitis Paternal Grandfather    Social History:  Social History   Tobacco Use  . Smoking status: Current Every Day Smoker    Packs/day: 1.00    Years: 40.00    Pack years: 40.00    Types: Cigarettes  . Smokeless tobacco: Never Used  Substance Use Topics  . Alcohol use: Yes    Comment: 1-2 glasses a month  . Drug use: No  -Lives at home with her son -Hasn't drank any EtOH Since being home.  Son states that when she feels up to it, she Butler drink daily somewhere between 1-3 shots of liquor.  -Cognitive and functional status has been on a steady decline for the past year  Review of Systems: A complete ROS was negative except as per HPI.   Imaging: CT abdomen/pelvis: IMPRESSION: 1. Interval development of moderate bilateral pleural effusions with adjacent areas of atelectatic change. Small focus of gas within the atelectatic portion of the left lung periphery (08/23/46). This  finding is nonspecific and could be seen with infection or air trapping within distal airways. 2. Circumferential thickening with mucosal hyperemia of the distal thoracic esophagus may represent esophagitis with a large hiatal hernia containing much of the proximal stomach. 3. Fluid distended appearance of the duodenum with focal decompression to more decompressed jejunal loops in the left upper quadrant. Mild edematous mural thickening is seen throughout the small bowel to the level of the terminal ileum. Findings are suggestive of infectious or inflammatory enteritis. 4. Large volume simple attenuation ascites in the abdomen as  well as extensive body wall edema, features compatible with anasarca. 5. Diffuse hepatic hypoattenuation which is most pronounced centrally and with sparing along the gallbladder fossa. This finding can be seen in the setting of hepatic steatosis. 6. Diffuse pancreatic atrophy with multiple areas of pancreatic parenchymal calcification likely suggesting sequela of prior pancreatitis. No focal acute peripancreatic inflammation. 7. 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 8. Stable sclerotic compression deformity of T8. Additional stable superior endplate deformities of T11 and L2. 9. Aortic Atherosclerosis (ICD10-I70.0).  Physical Exam: Blood pressure 109/86, pulse (!) 110, temperature 98.4 F (36.9 C), temperature source Oral, resp. rate 14, height '5\' 6"'$  (1.676 m), weight 52.2 kg, last menstrual period 02/22/2006, SpO2 94 %.  Physical Exam Vitals reviewed.  Constitutional:      General: She is in acute distress.     Appearance: She is ill-appearing. She is not diaphoretic.  HENT:     Head: Normocephalic.  Eyes:     Extraocular Movements: Extraocular movements intact.     Pupils: Pupils are equal, round, and reactive to light.  Cardiovascular:     Rate and Rhythm: Regular rhythm. Tachycardia present.     Heart sounds: Normal  heart sounds. No murmur. No friction rub. No gallop.   Pulmonary:     Effort: Pulmonary effort is normal. No respiratory distress.     Breath sounds: Normal breath sounds. No wheezing or rales.     Comments: Diminished lower lung sounds Abdominal:     General: Bowel sounds are decreased. There is distension.     Palpations: There is shifting dullness.     Tenderness: There is abdominal tenderness in the periumbilical area. There is no guarding or rebound.  Musculoskeletal:        General: Tenderness (TTP L calf) present.     Right lower leg: Edema (1+ to mid shin) present.     Left lower leg: Edema (2+ to the knee) present.  Skin:    Coloration: Skin is pale.  Neurological:     General: No focal deficit present.     Mental Status: She is alert and oriented to person, place, and time.     Motor: No weakness (moves lower extremities antigravity).  Psychiatric:        Mood and Affect: Mood normal.    Assessment & Plan by Problem: Active Problems:   Colitis   SBP (spontaneous bacterial peritonitis) ([place])   Hematochezia  In summary, Ms. Palinkas is a 59 y.o female with a hx of COPD, HTN, Guillan Aris Lot and recent hospital admission for colitis (2/14-2/19) who presents today with abdominal pain, nausea and vomiting. Her presentation appears to be most consistent with SBP and suggests a component of liver failure.    #SBP #Ascites #Anasarca #Colitis #Hematochezia #Nausea: There are multiple findings on the patient's CT abdomen that are concerning. Starting with circumferential thickening of the mucosa of the distal esophagus, suggesting esophagitis and large hiatal hernia, inflammatory enteritis, ascites, pancreatic atrophy and anasarca. CMP demonstrated elevated Alk phos which appears to be chronic compared to previous labs. AST and ALT are stable as well.  -Patient would likely benefit from a GI consult given hematochezia as well as hiatal hernia on CT abdomen. -U/s guided paracentesis  ordered, to be performed in the ED, per our discussions with the ED provider -Ceftriaxone 2 g daily + Flagyl 500 mg daily -IV Protonix 40 mg daily, IV compazine 5 mg q6hrs, Emetrol PRN for nausea  -Blood cultures  ordered -Fentanyl 12.5 mcg q2hrs PRN -EKG -Telemetry -CBC, BNP, Lactate, Magnesium ordered, PT/INR  #COPD #Bilateral pleural effusions #Diminished breath sounds: CT abdomen demonstrated bilateral pleural effusions. Butler need additional imaging to further characterize. The patient is currently saturating well on room air -CXR ordered -Albuterol 2 puffs q6hrs PRN -Monitor O2 levels  #Urinary rentention: Pt had >1L on initial bladder scan -Foley   #L calf pain #Bilateral lower  -Dopplers ordered to rule out DVT  #Alcohol use: It is unclear how much alcohol Ms. [name] is drinking on a regular basis.   -IV thiamine 500 mg today followed by 100 mg daily -IV folate 1 g daily  #FEN/GI -Diet: NPO -Fluids: None  #DVT prophylaxis -Lovenox 40 mg subcu injections daily  #CODE STATUS: FULL  #Dispo: Admit patient to Inpatient with expected length of stay greater than 2 midnights. Prior to Admission Living Arrangement: Home Anticipated Discharge Location: Home  Barriers to Discharge: Ongoing medical workup   Signed: [name], MD Internal Medicine, PGY1 Pager: [phone]  08/15/2019,5:22 AM

## 2019-08-16 ENCOUNTER — Inpatient Hospital Stay: Payer: Self-pay

## 2019-08-16 ENCOUNTER — Inpatient Hospital Stay (HOSPITAL_COMMUNITY): Payer: Medicaid Other | Admitting: Anesthesiology

## 2019-08-16 ENCOUNTER — Encounter (HOSPITAL_COMMUNITY): Payer: Self-pay | Admitting: Internal Medicine

## 2019-08-16 ENCOUNTER — Encounter (HOSPITAL_COMMUNITY): Admission: EM | Disposition: E | Payer: Self-pay | Source: Home / Self Care | Attending: Internal Medicine

## 2019-08-16 DIAGNOSIS — K2990 Gastroduodenitis, unspecified, without bleeding: Secondary | ICD-10-CM | POA: Diagnosis present

## 2019-08-16 DIAGNOSIS — K209 Esophagitis, unspecified without bleeding: Secondary | ICD-10-CM | POA: Diagnosis present

## 2019-08-16 DIAGNOSIS — K2970 Gastritis, unspecified, without bleeding: Secondary | ICD-10-CM | POA: Diagnosis present

## 2019-08-16 DIAGNOSIS — K317 Polyp of stomach and duodenum: Secondary | ICD-10-CM

## 2019-08-16 DIAGNOSIS — K449 Diaphragmatic hernia without obstruction or gangrene: Secondary | ICD-10-CM | POA: Insufficient documentation

## 2019-08-16 DIAGNOSIS — K766 Portal hypertension: Principal | ICD-10-CM

## 2019-08-16 DIAGNOSIS — R1084 Generalized abdominal pain: Secondary | ICD-10-CM | POA: Diagnosis present

## 2019-08-16 HISTORY — PX: SUBMUCOSAL TATTOO INJECTION: SHX6856

## 2019-08-16 HISTORY — PX: BIOPSY: SHX5522

## 2019-08-16 HISTORY — PX: ENTEROSCOPY: SHX5533

## 2019-08-16 LAB — CBC
HCT: 27.2 % — ABNORMAL LOW (ref 36.0–46.0)
Hemoglobin: 8.7 g/dL — ABNORMAL LOW (ref 12.0–15.0)
MCH: 33.9 pg (ref 26.0–34.0)
MCHC: 32 g/dL (ref 30.0–36.0)
MCV: 105.8 fL — ABNORMAL HIGH (ref 80.0–100.0)
Platelets: 170 10*3/uL (ref 150–400)
RBC: 2.57 MIL/uL — ABNORMAL LOW (ref 3.87–5.11)
RDW: 16.8 % — ABNORMAL HIGH (ref 11.5–15.5)
WBC: 10.7 10*3/uL — ABNORMAL HIGH (ref 4.0–10.5)
nRBC: 0 % (ref 0.0–0.2)

## 2019-08-16 LAB — CYTOLOGY - NON PAP

## 2019-08-16 LAB — PH, BODY FLUID: pH, Body Fluid: 7.9

## 2019-08-16 LAB — COMPREHENSIVE METABOLIC PANEL
ALT: 28 U/L (ref 0–44)
AST: 22 U/L (ref 15–41)
Albumin: 1.1 g/dL — ABNORMAL LOW (ref 3.5–5.0)
Alkaline Phosphatase: 199 U/L — ABNORMAL HIGH (ref 38–126)
Anion gap: 8 (ref 5–15)
BUN: 6 mg/dL (ref 6–20)
CO2: 24 mmol/L (ref 22–32)
Calcium: 7.2 mg/dL — ABNORMAL LOW (ref 8.9–10.3)
Chloride: 106 mmol/L (ref 98–111)
Creatinine, Ser: 0.63 mg/dL (ref 0.44–1.00)
GFR calc Af Amer: >60 mL/min (ref >60)
GFR calc non Af Amer: >60 mL/min (ref >60)
Glucose, Bld: 94 mg/dL (ref 70–99)
Potassium: 3.3 mmol/L — ABNORMAL LOW (ref 3.5–5.1)
Sodium: 138 mmol/L (ref 135–145)
Total Bilirubin: 0.9 mg/dL (ref 0.3–1.2)
Total Protein: 4.3 g/dL — ABNORMAL LOW (ref 6.5–8.1)

## 2019-08-16 LAB — MAGNESIUM: Magnesium: 2.3 mg/dL (ref 1.7–2.4)

## 2019-08-16 SURGERY — ENTEROSCOPY
Anesthesia: General

## 2019-08-16 MED ORDER — SPOT INK MARKER SYRINGE KIT
PACK | SUBMUCOSAL | Status: AC
  Filled 2019-08-16: qty 5

## 2019-08-16 MED ORDER — POTASSIUM CHLORIDE CRYS ER 20 MEQ PO TBCR
40.0000 meq | EXTENDED_RELEASE_TABLET | Freq: Once | ORAL | Status: AC
  Administered 2019-08-16: 40 meq via ORAL
  Filled 2019-08-16: qty 2

## 2019-08-16 MED ORDER — POTASSIUM CHLORIDE 10 MEQ/100ML IV SOLN
10.0000 meq | INTRAVENOUS | Status: AC
  Administered 2019-08-16 (×2): 10 meq via INTRAVENOUS

## 2019-08-16 MED ORDER — POTASSIUM CHLORIDE 10 MEQ/100ML IV SOLN
10.0000 meq | INTRAVENOUS | Status: AC
  Administered 2019-08-16 (×3): 10 meq via INTRAVENOUS
  Filled 2019-08-16 (×5): qty 100

## 2019-08-16 MED ORDER — SPOT INK MARKER SYRINGE KIT
PACK | SUBMUCOSAL | Status: DC | PRN
  Administered 2019-08-16: 1 mL via SUBMUCOSAL

## 2019-08-16 MED ORDER — PROPOFOL 500 MG/50ML IV EMUL
INTRAVENOUS | Status: DC | PRN
  Administered 2019-08-16: 300 ug/kg/min via INTRAVENOUS
  Administered 2019-08-16: 15 mg via INTRAVENOUS
  Administered 2019-08-16 (×2): 20 mg via INTRAVENOUS
  Administered 2019-08-16: 100 ug/kg/min via INTRAVENOUS

## 2019-08-16 MED ORDER — ADULT MULTIVITAMIN W/MINERALS CH
1.0000 | ORAL_TABLET | Freq: Every day | ORAL | Status: DC
  Administered 2019-08-17 – 2019-08-29 (×10): 1 via ORAL
  Filled 2019-08-16 (×14): qty 1

## 2019-08-16 MED ORDER — PRO-STAT SUGAR FREE PO LIQD
30.0000 mL | Freq: Two times a day (BID) | ORAL | Status: DC
  Administered 2019-08-16 – 2019-08-17 (×2): 30 mL via ORAL
  Filled 2019-08-16 (×3): qty 30

## 2019-08-16 MED ORDER — DEXTROSE-NACL 5-0.45 % IV SOLN: INTRAVENOUS | Status: DC

## 2019-08-16 MED ORDER — LACTATED RINGERS IV SOLN: INTRAVENOUS | Status: DC | PRN

## 2019-08-16 MED ORDER — SUCRALFATE 1 GM/10ML PO SUSP
1.0000 g | Freq: Three times a day (TID) | ORAL | Status: DC
  Administered 2019-08-16 – 2019-09-01 (×34): 1 g via ORAL
  Filled 2019-08-16 (×42): qty 10

## 2019-08-16 MED ORDER — SODIUM CHLORIDE 0.9 % IV SOLN
8.0000 mg | Freq: Once | INTRAVENOUS | Status: AC
  Administered 2019-08-16: 8 mg via INTRAVENOUS
  Filled 2019-08-16: qty 4

## 2019-08-16 NOTE — Anesthesia Procedure Notes (Signed)
Procedure Name: MAC Date/Time: 08/11/2019 11:03 AM Performed by: Imagene Riches, CRNA Pre-anesthesia Checklist: Patient identified, Emergency Drugs available, Suction available, Patient being monitored and Timeout performed Patient Re-evaluated:Patient Re-evaluated prior to induction Oxygen Delivery Method: Nasal cannula

## 2019-08-16 NOTE — Op Note (Signed)
[facility] Patient Name: Catherine Butler Procedure Date : August 18, 2019 MRN: [id number] Attending MD: Doristine Locks , MD Date of Birth: [birth year] CSN: [id number] Age: 59 Admit Type: Inpatient Procedure:                Small bowel enteroscopy Indications:              Unexplained generalized abdominal distress/pain,                            Abnormal abdominal CT, Gastrointestinal occult                            blood loss, Nausea with vomiting Providers:                Doristine Locks, MD, Roselie Awkward, RN, Lanna Poche, Asher Muir CRNA Referring MD:              Medicines:                Monitored Anesthesia Care Complications:            No immediate complications. Estimated Blood Loss:     Estimated blood loss was minimal. Procedure:                Pre-Anesthesia Assessment:                           - Prior to the procedure, a History and Physical                            was performed, and patient medications and                            allergies were reviewed. The patient's tolerance of                            previous anesthesia was also reviewed. The risks                            and benefits of the procedure and the sedation                            options and risks were discussed with the patient.                            All questions were answered, and informed consent                            was obtained. Prior Anticoagulants: The patient has                            taken no previous anticoagulant or antiplatelet                            agents.  ASA Grade Assessment: III - A patient with                            severe systemic disease. After reviewing the risks                            and benefits, the patient was deemed in                            satisfactory condition to undergo the procedure.                           After obtaining informed consent, the endoscope was                 passed under direct vision. Throughout the                            procedure, the patient's blood pressure, pulse, and                            oxygen saturations were monitored continuously. The                            [id number]-H190DL ([id number] Olympus pediatric colonscope                            was introduced through the mouth and advanced to                            the proximal jejunum. The small bowel enteroscopy                            was accomplished without difficulty. The patient                            tolerated the procedure well. Scope In: Scope Out: Findings:      LA Grade D (one or more mucosal breaks involving at least 75% of       esophageal circumference) esophagitis with no bleeding was found 20 to       31 cm from the incisors. Due to concern for underlying varices, mucosal       biopsies not obtained.      Esophagogastric landmarks were identified: the gastroesophageal junction       was found at 31 cm and the site of hiatal narrowing was found at 38 cm       from the incisors.      A 7 cm hiatal hernia was present.      Moderate gastritis was found in the gastric fundus and in the gastric       body. The "snakeskin" pattern of inflammation suggestive of portal       hypertensive gastropathy. Biopsies were taken with a cold forceps for       histology. Estimated blood loss was minimal.      Two 2 to 3 mm sessile polyps with no bleeding were found in the duodenal  bulb. The polyp was removed with a cold biopsy forceps. Resection and       retrieval were complete. Estimated blood loss was minimal.      There was no evidence of significant pathology in the remainder of the       duodenum and the proximal jejunum. Advanced endoscope to 90 cm distal to       the pylorus. Area was tattooed with an injection of 2 mL of Spot (carbon       black). Was actually able to advance the endoscope another 15 cm beyond       after tattoo placement  with endoscope reduction. Mucosa still otherwise       appeared normal. Biopsies were taken throughout the small bowel with a       cold forceps for histology. Estimated blood loss was minimal. Impression:               - LA Grade D esophagitis with no bleeding.                           - Esophagogastric landmarks identified.                           - 7 cm hiatal hernia.                           - Portal hypertensive gastropathy. Biopsied.                           - Two duodenal polyps. Resected and retrieved.                           - The examined portion of the jejunum was normal.                            Tattooed. Biopsied. Recommendation:           - Return patient to hospital ward for ongoing care.                           - Given severe esophagitis, to remain NPO for now                            with max medical therapy.                           - Await pathology results.                           - Use Protonix (pantoprazole) 40 mg IV BID.                           - Use sucralfate suspension 1 gram PO QID.                           - Nutrition consult. Procedure Code(s):        --- Professional ---  [phone], Small intestinal endoscopy, enteroscopy                            beyond second portion of duodenum, not including                            ileum; with biopsy, single or multiple                           44799, Unlisted procedure, small intestine Diagnosis Code(s):        --- Professional ---                           K20.90, Esophagitis, unspecified without bleeding                           K44.9, Diaphragmatic hernia without obstruction or                            gangrene                           K76.6, Portal hypertension                           K31.89, Other diseases of stomach and duodenum                           K31.7, Polyp of stomach and duodenum                           R10.84, Generalized abdominal pain                            R19.5, Other fecal abnormalities                           R11.2, Nausea with vomiting, unspecified                           R93.3, Abnormal findings on diagnostic imaging of                            other parts of digestive tract CPT copyright 2019 [employer]. All rights reserved. The codes documented in this report are preliminary and upon coder review may  be revised to meet current compliance requirements. Doristine Locks, MD 2019-09-12 11:40:45 AM Number of Addenda: 0

## 2019-08-16 NOTE — Progress Notes (Addendum)
Subjective:  Patient seen at bedside today.  Patient reports she has some continued abdominal pain and nausea.   Objective:    Vital Signs (last 24 hours): Vitals:   08/15/19 2156 08/15/19 2247 08/15/2019 0433 08/17/2019 0600  BP:  104/83 98/85   Pulse:  (!) 103 (!) 103   Resp: 16 15 18 20   Temp:  97.9 F (36.6 C) 98 F (36.7 C)   TempSrc:  Oral Oral   SpO2:  93% 98%   Weight:      Height:        Physical Exam: General Alert and answers questions appropriately, no acute distress  Cardiac Regular rate and rhythm, no murmurs, rubs, or gallops  Pulmonary Clear to auscultation bilaterally without wheezes, rhonchi, or rales  Extremities No peripheral edema   BMP Latest Ref Rng & Units 08/07/2019 08/15/2019 August 28, 2019  Glucose 70 - 99 mg/dL 94 - 134(H)  BUN 6 - 20 mg/dL 6 - <5(L)  Creatinine 0.44 - 1.00 mg/dL 0.63 0.71 0.78  BUN/Creat Ratio 9 - 23 - - -  Sodium 135 - 145 mmol/L 138 - 136  Potassium 3.5 - 5.1 mmol/L 3.3(L) - 3.8  Chloride 98 - 111 mmol/L 106 - 104  CO2 22 - 32 mmol/L 24 - 21(L)  Calcium 8.9 - 10.3 mg/dL 7.2(L) - 7.7(L)   CBC Latest Ref Rng & Units 07/27/2019 28-Aug-2019 08/10/2019  WBC 4.0 - 10.5 K/uL 10.7(H) 12.0(H) 4.0  Hemoglobin 12.0 - 15.0 g/dL 8.7(L) 10.8(L) 10.1(L)  Hematocrit 36.0 - 46.0 % 27.2(L) 35.0(L) 31.2(L)  Platelets 150 - 400 K/uL 170 227 122(L)   Assessment/Plan:   Principal Problem:   Nausea with vomiting Active Problems:   Bilateral leg weakness - chronic, after Guillian Barre episode   History of prolonged Q-T interval on ECG   Urinary retention   Colitis   Hematochezia   Ascites   Hypoalbuminemia   Enteritis   Pancreatic atrophy   Patient is a 59 year old female with past medical history significant for COPD, hypertension, AIDP and recent hospital admission for colitis (2/14-2/19) who presented on 2019/08/28 with abdominal pain, nausea, and vomiting.  # Enteritis/Colitis # Ascites CT abdomen/pelvis with significant changes from  prior study, including bilateral pleural effusions, circumferential thickening of the distal esophagus with a large hiatal hernia, much of stomach and thoracic cavity, diffuse small bowel edema, large volume ascites with anasarca and pancreatic atrophy.  GI is considering if this diffuse enteritis may be secondary to significant hypoalbuminemia - prealbumin also low at 6.4.  Labs indicate preserved synthetic function of liver with INR 1.1, total bilirubin 1.2, patient with chronic low-normal platelets.  EGD revealed severe esophagitis, GI recommends to continue with twice daily PPI and sucralfate. *Ascites fluid analysis with protein < 3, albumin < 1, serum albumin of 1.5 and total protein of 4.3. Fluid culture NG<12 hours.  *Nutrition consulted, had apparent difficulty contacting patient today *Continue ceftriaxone 2 g daily + metronidazole 500 mg every 8 hours *Phenergan 12.5 mg Q6HR PRN for nausea/vomiting, will monitor EKG, continue telemetry given QTc *Dilaudid 0.5 mg Q4HR PRN for moderate-severe pain *Given patient's poor nutritional status, enteritis, intolerance to p.o. intake, will initiate TPN.  Decision for TPN initiation was made in discussion with GI  # Possible alcohol use disorder *CIWA protocol without ativan *Continue IV thiamine 100 mg daily *Continue IV folate 1 g daily  # Urinary retention: In&Out caths at home. Foley placed in ER. Continue with bladder scans+in&out caths  # Left calf  pain: Ultrasound without evidence for DVT  PT/OT: Consult not indicated at this time Diet: NPO, D5-1/2NS at 100 cc/hr. Pharmacy consulted for TPN DVT Ppx: Lovenox 40 mg daily Admit Status: Inpatient Dispo: Anticipated discharge pending clinical improvement   Katherine Roan, MD 07/29/2019, 7:30 AM

## 2019-08-16 NOTE — Interval H&P Note (Signed)
History and Physical Interval Note:  Aug 28, 2019 10:35 AM  Catherine Butler  has presented today for surgery, with the diagnosis of enteritis throughout SB.  n/v, right abd pain.  The various methods of treatment have been discussed with the patient and family. After consideration of risks, benefits and other options for treatment, the patient has consented to  Procedure(s): ENTEROSCOPY (N/A) as a surgical intervention.  The patient's history has been reviewed, patient examined, no change in status, stable for surgery.  I have reviewed the patient's chart and labs.  Questions were answered to the patient's satisfaction.     Verlin Dike Javonn Gauger

## 2019-08-16 NOTE — Anesthesia Postprocedure Evaluation (Signed)
Anesthesia Post Note  Patient: GLADYSE CORVIN  Procedure(s) Performed: ENTEROSCOPY (N/A ) SUBMUCOSAL TATTOO INJECTION BIOPSY     Anesthesia Post Evaluation  Last Vitals:  Vitals:   08/12/2019 1200 08/08/2019 1213  BP: 111/65 99/78  Pulse: (!) 101 (!) 101  Resp: 16 17  Temp:  36.6 C  SpO2: 95% 93%    Last Pain:  Vitals:   08/06/2019 1213  TempSrc: Oral  PainSc: 0-No pain                 Mariah Gerstenberger

## 2019-08-16 NOTE — Progress Notes (Signed)
Initial Nutrition Assessment  DOCUMENTATION CODES:   Not applicable  INTERVENTION:    Boost Breeze po TID, each supplement provides 250 kcal and 9 grams of protein  30 ml Prostat BID, each supplement provides 100 kcals and 15 grams protein.   MVI daily   NUTRITION DIAGNOSIS:   Inadequate oral intake related to altered GI function, nausea as evidenced by per patient/family report.  GOAL:   Patient will meet greater than or equal to 90% of their needs  MONITOR:   Labs, Supplement acceptance, Weight trends, PO intake, Diet advancement, Skin, I & O's  REASON FOR ASSESSMENT:   Consult Assessment of nutrition requirement/status  ASSESSMENT:   Patient with PMH significant for COPD, HTN, Guillan Barre, quadriplegia, ETOH abuse, and recent admission for colitis. Presents this admission with ascites and colitis.   2/24- L paracentesis- 900 ml drained  RD working remotely. Attempted to call pt in the room, no answer. Per H&P, pt was unable to eat much post discharge a week ago. Has feeling that food is getting stuck in her throat that often leads to vomiting. Noted scabbing on her lips. Plan for EGD today. RD to provide supplements once diet advanced. If PO intake unable to progress may consider Cortrak placement.   Records indicate pt weighed 60.6 kg on 06/02/19 and (a stated) 52.2 kg this admission. Need to obtain recent weight to assess for weight loss. Suspect pt could be malnourished given prolonged poor intake. Unable to diagnose without history or NFPE.   Medications: folic acid, thiamine  Labs: K 3.3 (L)   Diet Order:   Diet Order            Diet NPO time specified  Diet effective midnight              EDUCATION NEEDS:   Not appropriate for education at this time  Skin:  Skin Assessment: Reviewed RN Assessment  Last BM:  PTA  Height:   Ht Readings from Last 1 Encounters:  08/05/2019 5\' 6"  (1.676 m)    Weight:   Wt Readings from Last 1 Encounters:   07/27/2019 52.2 kg    Ideal Body Weight:  51.7 kg(adjusted quadriplegia)  BMI:  Body mass index is 18.56 kg/m.  Estimated Nutritional Needs:   Kcal:  1600-1800 kcal  Protein:  80-95 grams  Fluid:  >/= 1.6 L/day   08/15/2019 RD, LDN Clinical Nutrition Pager listed in [facility]

## 2019-08-16 NOTE — Anesthesia Preprocedure Evaluation (Addendum)
Anesthesia Evaluation  Patient identified by MRN, date of birth, ID band Patient awake    Reviewed: Allergy & Precautions  History of Anesthesia Complications (+) PONV and DIFFICULT AIRWAY  Airway Mallampati: II  TM Distance: >3 FB     Dental   Pulmonary Current Smoker and Patient abstained from smoking.,    breath sounds clear to auscultation       Cardiovascular hypertension,  Rhythm:Regular Rate:Normal     Neuro/Psych    GI/Hepatic negative GI ROS, Neg liver ROS,   Endo/Other  negative endocrine ROS  Renal/GU Renal disease     Musculoskeletal   Abdominal   Peds  Hematology  (+) anemia ,   Anesthesia Other Findings   Reproductive/Obstetrics                             Anesthesia Physical Anesthesia Plan  ASA: III  Anesthesia Plan: General   Post-op Pain Management:    Induction: Intravenous  PONV Risk Score and Plan: 3 and Ondansetron and Midazolam  Airway Management Planned: Nasal Cannula and Simple Face Mask  Additional Equipment:   Intra-op Plan:   Post-operative Plan:   Informed Consent: I have reviewed the patients History and Physical, chart, labs and discussed the procedure including the risks, benefits and alternatives for the proposed anesthesia with the patient or authorized representative who has indicated his/her understanding and acceptance.     Dental advisory given  Plan Discussed with: Anesthesiologist and CRNA  Anesthesia Plan Comments:         Anesthesia Quick Evaluation

## 2019-08-16 NOTE — Transfer of Care (Signed)
Immediate Anesthesia Transfer of Care Note  Patient: Catherine Butler  Procedure(s) Performed: ENTEROSCOPY (N/A ) SUBMUCOSAL TATTOO INJECTION BIOPSY  Patient Location: Endoscopy Unit  Anesthesia Type:MAC  Level of Consciousness: drowsy  Airway & Oxygen Therapy: Patient Spontanous Breathing and Patient connected to nasal cannula oxygen  Post-op Assessment: Report given to RN and Post -op Vital signs reviewed and stable  Post vital signs: Reviewed and stable  Last Vitals:  Vitals Value Taken Time  BP 92/55 07/28/2019 1129  Temp    Pulse 105 08/10/2019 1130  Resp 21 08/09/2019 1130  SpO2 98 % 08/15/2019 1130  Vitals shown include unvalidated device data.  Last Pain:  Vitals:   07/28/2019 0959  TempSrc: Oral  PainSc: 9       Patients Stated Pain Goal: 2 (94/85/46 2703)  Complications: No apparent anesthesia complications

## 2019-08-17 ENCOUNTER — Inpatient Hospital Stay (HOSPITAL_COMMUNITY): Payer: Medicaid Other

## 2019-08-17 DIAGNOSIS — K2970 Gastritis, unspecified, without bleeding: Secondary | ICD-10-CM

## 2019-08-17 LAB — DIFFERENTIAL
Abs Immature Granulocytes: 0.04 10*3/uL (ref 0.00–0.07)
Basophils Absolute: 0 10*3/uL (ref 0.0–0.1)
Basophils Relative: 0 %
Eosinophils Absolute: 0.1 10*3/uL (ref 0.0–0.5)
Eosinophils Relative: 1 %
Immature Granulocytes: 0 %
Lymphocytes Relative: 25 %
Lymphs Abs: 2.3 10*3/uL (ref 0.7–4.0)
Monocytes Absolute: 0.8 10*3/uL (ref 0.1–1.0)
Monocytes Relative: 9 %
Neutro Abs: 6.2 10*3/uL (ref 1.7–7.7)
Neutrophils Relative %: 65 %

## 2019-08-17 LAB — TRIGLYCERIDES: Triglycerides: 242 mg/dL — ABNORMAL HIGH (ref <150)

## 2019-08-17 LAB — COMPREHENSIVE METABOLIC PANEL
ALT: 24 U/L (ref 0–44)
AST: 20 U/L (ref 15–41)
Albumin: 1.1 g/dL — ABNORMAL LOW (ref 3.5–5.0)
Alkaline Phosphatase: 187 U/L — ABNORMAL HIGH (ref 38–126)
Anion gap: 7 (ref 5–15)
BUN: 7 mg/dL (ref 6–20)
CO2: 21 mmol/L — ABNORMAL LOW (ref 22–32)
Calcium: 7.2 mg/dL — ABNORMAL LOW (ref 8.9–10.3)
Chloride: 106 mmol/L (ref 98–111)
Creatinine, Ser: 0.66 mg/dL (ref 0.44–1.00)
GFR calc Af Amer: >60 mL/min (ref >60)
GFR calc non Af Amer: >60 mL/min (ref >60)
Glucose, Bld: 111 mg/dL — ABNORMAL HIGH (ref 70–99)
Potassium: 3.5 mmol/L (ref 3.5–5.1)
Sodium: 134 mmol/L — ABNORMAL LOW (ref 135–145)
Total Bilirubin: 0.6 mg/dL (ref 0.3–1.2)
Total Protein: 4.2 g/dL — ABNORMAL LOW (ref 6.5–8.1)

## 2019-08-17 LAB — CBC
HCT: 26.2 % — ABNORMAL LOW (ref 36.0–46.0)
Hemoglobin: 8.5 g/dL — ABNORMAL LOW (ref 12.0–15.0)
MCH: 34.3 pg — ABNORMAL HIGH (ref 26.0–34.0)
MCHC: 32.4 g/dL (ref 30.0–36.0)
MCV: 105.6 fL — ABNORMAL HIGH (ref 80.0–100.0)
Platelets: 158 10*3/uL (ref 150–400)
RBC: 2.48 MIL/uL — ABNORMAL LOW (ref 3.87–5.11)
RDW: 16.8 % — ABNORMAL HIGH (ref 11.5–15.5)
WBC: 9.5 10*3/uL (ref 4.0–10.5)
nRBC: 0 % (ref 0.0–0.2)

## 2019-08-17 LAB — MAGNESIUM: Magnesium: 1.7 mg/dL (ref 1.7–2.4)

## 2019-08-17 LAB — PREALBUMIN: Prealbumin: 5.3 mg/dL — ABNORMAL LOW (ref 18–38)

## 2019-08-17 LAB — SURGICAL PATHOLOGY

## 2019-08-17 LAB — PHOSPHORUS: Phosphorus: 2.7 mg/dL (ref 2.5–4.6)

## 2019-08-17 MED ORDER — TRAVASOL 10 % IV SOLN
INTRAVENOUS | Status: AC
  Filled 2019-08-17: qty 470.4

## 2019-08-17 MED ORDER — BISACODYL 10 MG RE SUPP
10.0000 mg | Freq: Every day | RECTAL | Status: DC
  Administered 2019-08-17 – 2019-08-31 (×9): 10 mg via RECTAL
  Filled 2019-08-17 (×13): qty 1

## 2019-08-17 MED ORDER — DEXTROSE-NACL 5-0.45 % IV SOLN: INTRAVENOUS | Status: AC

## 2019-08-17 MED ORDER — SODIUM CHLORIDE 0.9% FLUSH
10.0000 mL | INTRAVENOUS | Status: DC | PRN
  Administered 2019-08-26: 10 mL

## 2019-08-17 MED ORDER — SODIUM CHLORIDE 0.9% FLUSH
10.0000 mL | Freq: Two times a day (BID) | INTRAVENOUS | Status: DC
  Administered 2019-08-17 – 2019-08-31 (×21): 10 mL

## 2019-08-17 MED ORDER — CHLORHEXIDINE GLUCONATE CLOTH 2 % EX PADS
6.0000 | MEDICATED_PAD | Freq: Every day | CUTANEOUS | Status: DC
  Administered 2019-08-17 – 2019-08-31 (×10): 6 via TOPICAL

## 2019-08-17 MED ORDER — SODIUM CHLORIDE 0.9 % IV SOLN: INTRAVENOUS | Status: AC

## 2019-08-17 NOTE — Progress Notes (Signed)
Progress Note    ASSESSMENT AND PLAN:   59 yo female with multiple issues; multiple CT scan, endoscopic. Laboratory findings.   Abdominal pain /  Enteritis on CT scan this admission (evolved from colitis on CT scan 08/05/19).  -Enteroscopy yesterday - no evidence for enteritis to correlate with CT scan findings. Biopsies pending.  -Endorses abdominal pain not specific about location  #Grade D esophagitis / 7 cm hiatal hernia -continue bid iv ppi -continue carafate QID -NPO for now given severe esophagitis -keep HOB elevated 45 degrees -sips ok -for TNA  # Ascites / possible portal hypertension.  -? chronic liver disease. Can have portal hypertension acutely with Etoh but labs don't really suggest she has Etoh hepatitis so must wonder about underlying cirrhosis.  --ammonia at upper end of normal.  --INR normal. Platelets intermittent low. Severe hypoalbuminemia could be malnutrition.   # Duodenal polyps, path pending  # Chronic pancreatitis by imaging  # ? Constipation, last BM a week ago.  -doesn't feel constipated but still I'm going to start daily Dulcolax suppositories, especially since she is on constipating medications.    SUBJECTIVE    Last BM was last week but doesn't feel constipated.    OBJECTIVE:     Vital signs in last 24 hours: Temp:  [97.8 F (36.6 C)-98.4 F (36.9 C)] 98.2 F (36.8 C) (02/26 0834) Pulse Rate:  [101-108] 105 (02/26 0845) Resp:  [9-24] 13 (02/26 0845) BP: (92-115)/(53-80) 101/80 (02/26 0845) SpO2:  [93 %-98 %] 94 % (02/26 0845) Weight:  [61.1 kg] 61.1 kg (02/25 1656)   General:   Alert, well-developed female in NAD EENT:  Normal hearing, non icteric sclera   Heart:  Regular rate and rhythm;  No lower extremity edema   Pulm: Normal respiratory effort   Abdomen:  Soft, nondistended, mild mid lower abdominal tenderness.   Normal bowel sounds.          Neurologic:  Alert and  oriented x4;  grossly normal neurologically. Psych:   Pleasant, cooperative.  Normal mood and affect.   Intake/Output from previous day: 02/25 0701 - 02/26 0700 In: 1387 [I.V.:1087; IV Piggyback:300] Out: 950 [Urine:950] Intake/Output this shift: No intake/output data recorded.  Lab Results: Recent Labs    08/13/2019 2135 2019/08/28 0254 08/17/19 0124  WBC 12.0* 10.7* 9.5  HGB 10.8* 8.7* 8.5*  HCT 35.0* 27.2* 26.2*  PLT 227 170 158   BMET Recent Labs    08/13/2019 2135 08/13/2019 2135 08/15/19 0408 08-28-19 0254 08/17/19 0124  NA 136  --   --  138 134*  K 3.8  --   --  3.3* 3.5  CL 104  --   --  106 106  CO2 21*  --   --  24 21*  GLUCOSE 134*  --   --  94 111*  BUN <5*  --   --  6 7  CREATININE 0.78   < > 0.71 0.63 0.66  CALCIUM 7.7*  --   --  7.2* 7.2*   < > = values in this interval not displayed.   LFT Recent Labs    08/17/19 0124  PROT 4.2*  ALBUMIN 1.1*  AST 20  ALT 24  ALKPHOS 187*  BILITOT 0.6   PT/INR Recent Labs    08/15/19 0408  LABPROT 13.7  INR 1.1   Hepatitis Panel No results for input(s): HEPBSAG, HCVAB, HEPAIGM, HEPBIGM in the last 72 hours.  VAS [place] LOWER EXTREMITY VENOUS (DVT)  Result Date: 08/15/2019  Lower Venous DVTStudy Indications: Pain, and Edema.  Risk Factors: COPD, Guillon Barre. Comparison Study: Left LEV 03-30-18, negative. Performing Technologist: Kennedy Bucker ARDMS, RVT  Examination Guidelines: A complete evaluation includes B-mode imaging, spectral Doppler, color Doppler, and power Doppler as needed of all accessible portions of each vessel. Bilateral testing is considered an integral part of a complete examination. Limited examinations for reoccurring indications may be performed as noted. The reflux portion of the exam is performed with the patient in reverse Trendelenburg.  +-----+---------------+---------+-----------+----------+--------------+ RIGHTCompressibilityPhasicitySpontaneityPropertiesThrombus Aging  +-----+---------------+---------+-----------+----------+--------------+ CFV  Full           Yes      Yes                                 +-----+---------------+---------+-----------+----------+--------------+   +---------+---------------+---------+-----------+----------+--------------+ LEFT     CompressibilityPhasicitySpontaneityPropertiesThrombus Aging +---------+---------------+---------+-----------+----------+--------------+ CFV      Full           Yes      Yes                                 +---------+---------------+---------+-----------+----------+--------------+ SFJ      Full                                                        +---------+---------------+---------+-----------+----------+--------------+ FV Prox  Full                                                        +---------+---------------+---------+-----------+----------+--------------+ FV Mid   Full                                                        +---------+---------------+---------+-----------+----------+--------------+ FV DistalFull                                                        +---------+---------------+---------+-----------+----------+--------------+ PFV      Full                                                        +---------+---------------+---------+-----------+----------+--------------+ POP      Full           Yes      Yes                                 +---------+---------------+---------+-----------+----------+--------------+ PTV      Full                                                        +---------+---------------+---------+-----------+----------+--------------+  PERO     Full                                                        +---------+---------------+---------+-----------+----------+--------------+ Poorly visualized calf veins due to edema. Hypoechoic, complex structure with no vascularity seen in popliteal fossa measuring 2.5 x  1.9 x 1.5 cm.    Summary: RIGHT: - No evidence of common femoral vein obstruction.  LEFT: - There is no evidence of deep vein thrombosis in the lower extremity.  - Hypoechoic, complex structure with no vascularity seen in popliteal fossa measuring 2.5 x 1.9 x 1.5 cm.  *See table(s) above for measurements and observations. Electronically signed by Harold Barban MD on 08/15/2019 at 10:32:10 PM.    Final    [place] EKG SITE RITE  Result Date: 08/01/2019 If [facility] image not attached, placement could not be confirmed due to current cardiac rhythm.     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

## 2019-08-17 NOTE — Progress Notes (Signed)
Peripherally Inserted Central Catheter/Midline Placement  The IV Nurse has discussed with the patient and/or persons authorized to consent for the patient, the purpose of this procedure and the potential benefits and risks involved with this procedure.  The benefits include less needle sticks, lab draws from the catheter, and the patient may be discharged home with the catheter. Risks include, but not limited to, infection, bleeding, blood clot (thrombus formation), and puncture of an artery; nerve damage and irregular heartbeat and possibility to perform a PICC exchange if needed/ordered by physician.  Alternatives to this procedure were also discussed.  Bard Power PICC patient education guide, fact sheet on infection prevention and patient information card has been provided to patient /or left at bedside.    PICC/Midline Placement Documentation  PICC Double Lumen 08/17/19 PICC Left Brachial 42 cm 0 cm (Active)  Indication for Insertion or Continuance of Line Administration of hyperosmolar/irritating solutions (i.e. TPN, Vancomycin, etc.) 08/17/19 1400  Exposed Catheter (cm) 0 cm 08/17/19 1400  Site Assessment Clean;Dry;Intact 08/17/19 1400  Lumen #1 Status Flushed;Blood return noted 08/17/19 1400  Lumen #2 Status Flushed;Blood return noted 08/17/19 1400  Dressing Type Transparent 08/17/19 1400  Dressing Status Clean;Dry;Intact;Antimicrobial disc in place 08/17/19 1400  Dressing Change Due 08/24/19 08/17/19 1400       Stacie Glaze Horton 08/17/2019, 2:27 PM

## 2019-08-17 NOTE — Progress Notes (Addendum)
Nutrition Follow up  DOCUMENTATION CODES:   Severe malnutrition in context of chronic illness  INTERVENTION:   D/C Boost Breeze, Prostat   TPN to meet 100% of needs  MVI daily   NUTRITION DIAGNOSIS:   Severe Malnutrition related to chronic illness(colitis/esophagitis) as evidenced by mild fat depletion, severe muscle depletion, energy intake < or equal to 75% for > or equal to 1 month.  Ongoing  GOAL:   Patient will meet greater than or equal to 90% of their needs   Not meeting   MONITOR:   PO intake, Supplement acceptance, Diet advancement, Skin, Weight trends, Labs, I & O's  REASON FOR ASSESSMENT:   Consult Assessment of nutrition requirement/status  ASSESSMENT:   Patient with PMH significant for COPD, HTN, Guillan Barre, quadriplegia, ETOH abuse, and recent admission for colitis. Presents this admission with ascites and colitis.   2/24- L paracentesis- 900 ml drained 2/25- EGD reveals severe esophagitis   Per MD, plan bowel rest and initiation of TPN. Pt remains on clear liquid diet? Seems she is not adhering to the NPO diet. Will monitor for diet advancement and provide supplements as needed. Pt does not like Boost Breeze. Will d/c.   If PO intake does not progress may consider Cortrak with EN once medically able as pt is malnourished.   PTA- pt endorses having a loss of appetite for 2-3 months PTA. States she typically eats two meals daily that consist of jello or pudding.   Admission weight: 52.2 kg  Current weight: 61.1 kg   I/O: -2,223 ml since admit UOP: 950 ml x 24 hrs   Drips: NS @ 65 ml/hr  Medications: dulcolax, folic acid, MVI with minerals, thiamine  Labs: Na 134 (L)   Diet Order:   Diet Order            Diet clear liquid Room service appropriate? Yes; Fluid consistency: Thin  Diet effective now              EDUCATION NEEDS:   Education needs have been addressed  Skin:  Skin Assessment: Reviewed RN Assessment  Last BM:   PTA  Height:   Ht Readings from Last 1 Encounters:  07/29/2019 5\' 6"  (1.676 m)    Weight:   Wt Readings from Last 1 Encounters:  08-17-2019 61.1 kg    Ideal Body Weight:  51.7 kg(adjusted quadriplegia)  BMI:  Body mass index is 21.74 kg/m.  Estimated Nutritional Needs:   Kcal:  1600-1800 kcal  Protein:  80-95 grams  Fluid:  >/= 1.6 L/day   08/18/19 RD, LDN Clinical Nutrition Pager listed in AMION

## 2019-08-17 NOTE — Progress Notes (Signed)
Patient C/O pain to Lt PICC site.  PICC placed earlier today.  No hematoma or swelling at PICC site.  No palpable mass.  Appears WNL.  C/O pain when site touched.  Noted swelling below AC area to left forearm.  Also complaining of pain to elbow area.  Heating pad applied to left forearm and PICC site for comfort.  Will continue to monitor.

## 2019-08-17 NOTE — Progress Notes (Addendum)
Subjective:  When team entered the room the patient was vomiting. She states that she has "not really" been adhering to the NPO diet. Patient wants to be able to drink water because she is concerned about her body becoming dehydrated. Patient counseled that she is receiving IV fluids which will prevent [place] from being dehydrated.  Objective:   Vital Signs (last 24 hours): Vitals:   08/17/19 0012 08/17/19 0300 08/17/19 0351 08/17/19 0437  BP:   97/79   Pulse:   (!) 106   Resp: 14 14 12 14   Temp:   98.4 F (36.9 C)   TempSrc:   Oral   SpO2:   97%   Weight:      Height:        Physical Exam: General Alert and answers questions appropriately, no acute distress  Cardiac Regular rate and rhythm, no murmurs, rubs, or gallops  Pulmonary Clear to auscultation bilaterally without wheezes, rhonchi, or rales  Extremities No peripheral edema   CBC Latest Ref Rng & Units 08/17/2019 07/29/2019 Aug 23, 2019  WBC 4.0 - 10.5 K/uL 9.5 10.7(H) 12.0(H)  Hemoglobin 12.0 - 15.0 g/dL 8.5(L) 8.7(L) 10.8(L)  Hematocrit 36.0 - 46.0 % 26.2(L) 27.2(L) 35.0(L)  Platelets 150 - 400 K/uL 158 170 227   CMP Latest Ref Rng & Units 08/17/2019 08/03/2019 08/15/2019  Glucose 70 - 99 mg/dL 111(H) 94 -  BUN 6 - 20 mg/dL 7 6 -  Creatinine 0.44 - 1.00 mg/dL 0.66 0.63 0.71  Sodium 135 - 145 mmol/L 134(L) 138 -  Potassium 3.5 - 5.1 mmol/L 3.5 3.3(L) -  Chloride 98 - 111 mmol/L 106 106 -  CO2 22 - 32 mmol/L 21(L) 24 -  Calcium 8.9 - 10.3 mg/dL 7.2(L) 7.2(L) -  Total Protein 6.5 - 8.1 g/dL 4.2(L) 4.3(L) -  Total Bilirubin 0.3 - 1.2 mg/dL 0.6 0.9 -  Alkaline Phos 38 - 126 U/L 187(H) 199(H) -  AST 15 - 41 U/L 20 22 -  ALT 0 - 44 U/L 24 28 -    Assessment/Plan:   Principal Problem:   Nausea with vomiting Active Problems:   Bilateral leg weakness - chronic, after Guillian Barre episode   History of prolonged Q-T interval on ECG   Urinary retention   Colitis   Hematochezia   Ascites   Hypoalbuminemia   Enteritis  Pancreatic atrophy   Generalized abdominal pain   Gastritis and gastroduodenitis   Acute esophagitis   Hiatal hernia  Patient is a 59 year old female with past medical history significant for COPD, hypertension, AIDP, recent hospital admission for colitis (2/14-2/19) who presented on 08/23/2019 with abdominal pain, nausea, and vomiting.  # Enteritis/Colitis # Ascites CT abdomen/pelvis was with significant changes from prior study including bilateral pleural effusions, circumferential thickening of distal esophagus with a large hiatal hernia, diffuse small bowel edema, large volume ascites with anasarca and pancreatic atrophy.  GI is considering that this diffuse enteritis may be secondary to significant hypoalbuminemia secondary to poor nutritional status.  Laboratory studies indicate preserved synthetic function of the liver with INR 1.1, total bilirubin 1.2, patient does have chronic low-normal platelets which is not thought to be secondary to cirrhosis.  EGD yesterday revealed severe esophagitis. *Discontinue antibiotics, unlikely to be infectious colitis *Patient initiated on TPN due to need for bowel rest, intolerance to p.o., poor nutritional status *Pantoprazole 40 mg IV twice daily *Sucralfate 1 g 3 times daily *Phenergan 12.5 mg every 6 hours as needed + Dilaudid 0.5 mg every 4 hours as  needed (given x2 yesterday)  # Possible alcohol use disorder *CIWA protocol without Ativan *Continue IV thiamine 1 to 2 mg daily *Continue IV folate 1 g daily  # Urinary retention: Patient with neurogenic bladder, in&out caths at home. Placed Foley in ER, was removed when patient moved to floor, with orders for in and out catheterization.  Due to difficulty with Foley placement (3 unsuccessful attempts), indwelling coude Foley was placed yesterday 2/25  # Left calf pain: Ultrasound without evidence for DVT  PT/OT: Consult, not indicated at this time Diet: Sips only for comfort, on TPN DVT Ppx:  Lovenox 40 mg daily Admit Status: Inpatient Dispo: Anticipated discharge pending clinical improvement  Catherine Roan, MD 08/17/2019, 6:35 AM

## 2019-08-17 NOTE — Plan of Care (Signed)

## 2019-08-17 NOTE — TOC Initial Note (Signed)
Transition of Care [facility]) - Initial/Assessment Note    Patient Details  Name: Catherine Butler MRN: [id number] Date of Birth: Mar 27, 1961  Transition of Care [facility]) CM/SW Contact:    Leone Haven, RN Phone Number: 08/17/2019, 3:20 PM  Clinical Narrative:                 NCM spoke with patient, she states her son will transport her home at dc.  Per previous NCM note patient has Environmental consultant, w/chair and neb machine and shower stool  at home.  Son Sharia Reeve states they need a BSC.  He is still interested in getting an aid for his mom.  Will need pt/ot eval also. Patient was referred out to [facility] for HHPT/HHOT, NCM asked Kandee Keen if patient was still active he states no ,they could not get in contact with patient so referral was closed.  Await pt/ot eval. NCM spoke with Fleet Contras at [facility] for [facility] aide services, she state will need to call [facility] to start aide service assement when patient is ready, so will await pt/ot eval to see recs.   Expected Discharge Plan: Home w Home Health Services Barriers to Discharge: Continued Medical Work up   Patient Goals and CMS Choice Patient states their goals for this hospitalization and ongoing recovery are:: to get better      Expected Discharge Plan and Services Expected Discharge Plan: Home w Home Health Services In-house Referral: NA Discharge Planning Services: CM Consult   Living arrangements for the past 2 months: Single Family Home                 DME Arranged: (NA)         HH Arranged: NA          Prior Living Arrangements/Services Living arrangements for the past 2 months: Single Family Home Lives with:: Adult Children Patient language and need for interpreter reviewed:: Yes Do you feel safe going back to the place where you live?: Yes      Need for Family Participation in Patient Care: Yes (Comment) Care giver support system in place?: Yes (comment)   Criminal Activity/Legal Involvement Pertinent to Current  Situation/Hospitalization: No - Comment as needed  Activities of Daily Living Home Assistive Devices/Equipment: Walker (specify type) ADL Screening (condition at time of admission) Patient's cognitive ability adequate to safely complete daily activities?: Yes Is the patient deaf or have difficulty hearing?: No Does the patient have difficulty seeing, even when wearing glasses/contacts?: No Does the patient have difficulty concentrating, remembering, or making decisions?: No Patient able to express need for assistance with ADLs?: Yes Does the patient have difficulty dressing or bathing?: No Independently performs ADLs?: Yes (appropriate for developmental age) Does the patient have difficulty walking or climbing stairs?: Yes Weakness of Legs: Both Weakness of Arms/Hands: None  Permission Sought/Granted                  Emotional Assessment   Attitude/Demeanor/Rapport: Engaged Affect (typically observed): Appropriate Orientation: : Oriented to Self, Oriented to Place, Oriented to  Time, Oriented to Situation   Psych Involvement: No (comment)  Admission diagnosis:  Colitis [K52.9] Generalized abdominal pain [R10.84] Other ascites [R18.8] Ascites [R18.8] Aspiration into airway [T17.908A] Patient Active Problem List   Diagnosis Date Noted  . Generalized abdominal pain   . Gastritis and gastroduodenitis   . Acute esophagitis   . Hiatal hernia   . Colitis 08/15/2019  . Hematochezia 08/15/2019  . Ascites 08/15/2019  . Hypoalbuminemia   .  Enteritis   . Pancreatic atrophy   . Nausea with vomiting 08/05/2019  . Urinary retention 06/12/2019  . Odynophagia 06/12/2019  . Anemia 06/12/2019  . Acute renal failure (ARF) ([place]) 06/02/2019  . High anion gap metabolic acidosis 04/23/1593  . Hyponatremia 06/02/2019  . Acute urinary retention   . Protein-calorie malnutrition, severe 07/21/2017  . Colonic mass 07/18/2017  . Dehydration 07/18/2017  . Hypomagnesemia 07/18/2017  .  History of prolonged Q-T interval on ECG 07/06/2017  . Tachycardia   . Generalized anxiety disorder   . Debility 06/23/2017  . Constipation   . Quadriplegia and quadriparesis ([place])   . Guillain Barr syndrome ([place])   . Hypokalemia 06/21/2017  . Sinus tachycardia 06/21/2017  . Essential hypertension 04/14/2017  . Paresthesia   . 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 Tobacco abuse 01/01/2017  . Abnormal LFTs 01/01/2017  . Depression   . COPD (chronic obstructive pulmonary disease) ([place]) 06/16/2015   PCP:  Kerin Perna, NP Pharmacy:   [facility], [address] [place] [address] Phone: [phone] Fax: [phone]  [facility] [place] [place] - [address] [place] [address] Phone: [phone] Fax: [phone]     Social Determinants of Health (SDOH) Interventions    Readmission Risk Interventions Readmission Risk Prevention Plan 08/17/2019 08/17/2019  Transportation Screening Complete Complete  PCP or Specialist Appt within 3-5 Days Complete -  HRI or [place] (No Data) -  Social Work Consult for [place] Planning/Counseling - [place] Screening Not Applicable Not Applicable  Medication Review (RN Care Manager) Complete Complete  Some recent data might be hidden

## 2019-08-17 NOTE — Progress Notes (Signed)
PHARMACY - TOTAL PARENTERAL NUTRITION CONSULT NOTE   Indication: Severe esophagitis   Patient Measurements: Height: 5' 6" (167.6 cm) Weight: 134 lb 11.2 oz (61.1 kg) IBW/kg (Calculated) : 59.3 TPN AdjBW (KG): 52.2 Body mass index is 21.74 kg/m. Usual Weight: 61 kg  Assessment: 59 year old female with PMH significant for COPD, HTN, Guillan Barre, quadriplegia, ETOH abuse, and recent admission for colitis now presenting with ascites and colitis on 08/05/2019. Patient has not been eating well since discharge a week prior and feeling that food has been getting stuck in her throat leading to vomiting.   Glucose / Insulin: BG 94-111. Currently on D5-1/2NS at 100 mL/hr.  Electrolytes: K 3.5, Mg 1.7, Na slightly low at 134. CoCa 9.5. Others within normal limits.  Renal: SCr stable at 0.66 LFTs / TGs: Elevated Alk Phos trending down (187). TG elevated at 242. Others within normal limits. Prealbumin / albumin: Pre-albumin 6.4 >>5.3; Alb 1.1.  Intake / Output; MIVF: 1387 mL/ 950 mL, +437 mL last 24 hrs. Net - 2.2L.  GI Imaging: 08/15/19 CT Abdomen - moderate bilateral pleural effusions, esophagitis and hiatal hernia, large volume ascites Surgeries / Procedures:  08/15/19 - L paracentesis with 900 mL drained 08/17/2019 - EGD: severe esophagitis, 7 cm hiatal hernia, portal hypertensive gastropathy, moderate gastritis, two duodenal polyps which were resected.   Central access: PICC, 08/17/19 TPN start date: 08/17/19  Nutritional Goals (per RD recommendation on 08/09/2019): KCal: 1600-1800, Protein: 80-95, Fluid: >= 1.6 L/ day Goal TPN rate is 70 mL/hr (provides 94 g of protein and 1737 kcals per day)  Current Nutrition:  NPO - sips ok  Plan:  Start TPN at 35 mL/hr at 1800 - This provides 47 g protein, 126 g dextrose, and 25 g lipids providing 868 kcal/day.  Electrolytes in TPN: 84mq/L of Na, 534m/L of K, 39m79mL of Ca, 39mE63m of Mg, and 139mm88m of Phos. Cl:Ac ratio 1:1 Continue MVI/TE, Thiamine, and  folic acid oral tablets for now. Will not add to TPN. If unable to take orals will place in TPN. Initiate Sensitive q8h SSI and adjust as needed  Reduce MIVF  to 65 mL/hr at 1800 - change to NS Monitor TPN labs on Mon/Thurs and daily for at least 3 days as titrate up TPN  HaiglerrmD, BCPS, BCCCP Clinical Pharmacist Clinical phone 08/17/2019 until 3PM - #[phone] refer to [facility] 08/17/2019,8:30 AM

## 2019-08-18 ENCOUNTER — Inpatient Hospital Stay (HOSPITAL_COMMUNITY): Payer: Medicaid Other

## 2019-08-18 DIAGNOSIS — M7989 Other specified soft tissue disorders: Secondary | ICD-10-CM

## 2019-08-18 DIAGNOSIS — G610 Guillain-Barre syndrome: Secondary | ICD-10-CM

## 2019-08-18 LAB — HEMOGLOBIN A1C
Hgb A1c MFr Bld: 4.5 % — ABNORMAL LOW (ref 4.8–5.6)
Mean Plasma Glucose: 82.45 mg/dL

## 2019-08-18 LAB — PHOSPHORUS: Phosphorus: 2.2 mg/dL — ABNORMAL LOW (ref 2.5–4.6)

## 2019-08-18 LAB — BASIC METABOLIC PANEL
Anion gap: 10 (ref 5–15)
BUN: 6 mg/dL (ref 6–20)
CO2: 19 mmol/L — ABNORMAL LOW (ref 22–32)
Calcium: 7.4 mg/dL — ABNORMAL LOW (ref 8.9–10.3)
Chloride: 106 mmol/L (ref 98–111)
Creatinine, Ser: 0.59 mg/dL (ref 0.44–1.00)
GFR calc Af Amer: >60 mL/min (ref >60)
GFR calc non Af Amer: >60 mL/min (ref >60)
Glucose, Bld: 121 mg/dL — ABNORMAL HIGH (ref 70–99)
Potassium: 3.5 mmol/L (ref 3.5–5.1)
Sodium: 135 mmol/L (ref 135–145)

## 2019-08-18 LAB — LIPASE, FLUID: Lipase-Fluid: <3 U/L

## 2019-08-18 LAB — GLUCOSE, CAPILLARY
Glucose-Capillary: 131 mg/dL — ABNORMAL HIGH (ref 70–99)
Glucose-Capillary: 140 mg/dL — ABNORMAL HIGH (ref 70–99)

## 2019-08-18 LAB — MAGNESIUM: Magnesium: 1.5 mg/dL — ABNORMAL LOW (ref 1.7–2.4)

## 2019-08-18 MED ORDER — MAGNESIUM SULFATE 2 GM/50ML IV SOLN
2.0000 g | Freq: Once | INTRAVENOUS | Status: AC
  Administered 2019-08-18: 13:00:00 2 g via INTRAVENOUS
  Filled 2019-08-18: qty 50

## 2019-08-18 MED ORDER — TRAVASOL 10 % IV SOLN
INTRAVENOUS | Status: AC
  Filled 2019-08-18: qty 672

## 2019-08-18 MED ORDER — SODIUM CHLORIDE 0.9 % IV SOLN: INTRAVENOUS | Status: AC

## 2019-08-18 MED ORDER — INSULIN ASPART 100 UNIT/ML ~~LOC~~ SOLN
0.0000 [IU] | Freq: Four times a day (QID) | SUBCUTANEOUS | Status: DC
  Administered 2019-08-18 – 2019-08-20 (×7): 1 [IU] via SUBCUTANEOUS
  Administered 2019-08-20: 2 [IU] via SUBCUTANEOUS
  Administered 2019-08-20 (×2): 1 [IU] via SUBCUTANEOUS
  Administered 2019-08-21: 2 [IU] via SUBCUTANEOUS
  Administered 2019-08-21 – 2019-08-22 (×4): 1 [IU] via SUBCUTANEOUS
  Administered 2019-08-22 – 2019-08-23 (×2): 2 [IU] via SUBCUTANEOUS
  Administered 2019-08-23: 1 [IU] via SUBCUTANEOUS
  Administered 2019-08-23 (×2): 2 [IU] via SUBCUTANEOUS
  Administered 2019-08-24: 1 [IU] via SUBCUTANEOUS
  Administered 2019-08-24 – 2019-08-25 (×3): 2 [IU] via SUBCUTANEOUS
  Administered 2019-08-25: 1 [IU] via SUBCUTANEOUS
  Administered 2019-08-25: 2 [IU] via SUBCUTANEOUS
  Administered 2019-08-25: 1 [IU] via SUBCUTANEOUS
  Administered 2019-08-26 (×2): 2 [IU] via SUBCUTANEOUS
  Administered 2019-08-26 – 2019-08-27 (×2): 1 [IU] via SUBCUTANEOUS
  Administered 2019-08-27 – 2019-08-28 (×4): 2 [IU] via SUBCUTANEOUS
  Administered 2019-08-28: 1 [IU] via SUBCUTANEOUS
  Administered 2019-08-28 – 2019-08-29 (×3): 2 [IU] via SUBCUTANEOUS

## 2019-08-18 MED ORDER — POTASSIUM PHOSPHATES 15 MMOLE/5ML IV SOLN
15.0000 mmol | Freq: Once | INTRAVENOUS | Status: AC
  Administered 2019-08-18: 14:00:00 15 mmol via INTRAVENOUS
  Filled 2019-08-18: qty 5

## 2019-08-18 NOTE — Progress Notes (Signed)
Subjective:  Patient seen at bedside today.  Patient states her abdominal pain is little bit improved, nausea improved.  Patient counseled on need to restrict her oral intake, use ice chips and small sips of water for comfort.  Objective:    Vital Signs (last 24 hours): Vitals:   08/17/19 2331 08/17/19 2340 08/17/19 2343 08/18/19 0357  BP: 97/71   113/82  Pulse: (!) 105 100  (!) 108  Resp: 13  19 15   Temp: 98.1 F (36.7 C)   98.3 F (36.8 C)  TempSrc: Oral   Oral  SpO2: 96%   99%  Weight:      Height:       Physical Exam: General Alert and answers questions appropriately, no acute distress  Cardiac Regular rate and rhythm, no murmurs, rubs, or gallops  Pulmonary Clear to auscultation bilaterally without wheezes, rhonchi, or rales  Abdominal Soft, mild tenderness to palpation, no distention  Extremities 1+ pitting edema in left lower extremity    CMP Latest Ref Rng & Units 08/17/2019 08/31/2019 08/15/2019  Glucose 70 - 99 mg/dL 08/17/2019) 94 -  BUN 6 - 20 mg/dL 7 6 -  Creatinine 073(X - 1.00 mg/dL 1.06 2.69 4.85  Sodium 135 - 145 mmol/L 134(L) 138 -  Potassium 3.5 - 5.1 mmol/L 3.5 3.3(L) -  Chloride 98 - 111 mmol/L 106 106 -  CO2 22 - 32 mmol/L 21(L) 24 -  Calcium 8.9 - 10.3 mg/dL 7.2(L) 7.2(L) -  Total Protein 6.5 - 8.1 g/dL 4.2(L) 4.3(L) -  Total Bilirubin 0.3 - 1.2 mg/dL 0.6 0.9 -  Alkaline Phos 38 - 126 U/L 187(H) 199(H) -  AST 15 - 41 U/L 20 22 -  ALT 0 - 44 U/L 24 28 -   CBC Latest Ref Rng & Units 08/17/2019 August 31, 2019 08/06/2019  WBC 4.0 - 10.5 K/uL 9.5 10.7(H) 12.0(H)  Hemoglobin 12.0 - 15.0 g/dL 07/29/2019) 7.0(J) 10.8(L)  Hematocrit 36.0 - 46.0 % 26.2(L) 27.2(L) 35.0(L)  Platelets 150 - 400 K/uL 158 170 227    Assessment/Plan:   Principal Problem:   Nausea with vomiting Active Problems:   Bilateral leg weakness - chronic, after Guillian Barre episode   History of prolonged Q-T interval on ECG   Urinary retention   Colitis   Hematochezia   Ascites  Hypoalbuminemia   Enteritis   Pancreatic atrophy   Generalized abdominal pain   Gastritis and gastroduodenitis   Acute esophagitis   Hiatal hernia  Patient is a 59 year old female with past medical history significant for COPD, hypertension, AIDP, recent hospitalization for colitis (2/14-2/19) who presented on 07/28/2019 with abdominal pain, nausea, and vomiting.  # Ascites # Enteritis/Colitis CT abdomen/pelvis with significant changes from prior study including bilateral pleural effusions, circumferential thickening of distal esophagus with a large hiatal hernia, diffuse small bowel edema, large volume ascites with anasarca and pancreatic atrophy.  EGD was performed on 08/31/2019 and demonstrated severe esophagitis, and essentially normal appearing small bowel .GI is considering that the diffuse enteritis seen on CT scan may be secondary to significant hypoalbuminemia secondary to poor nutritional status.  Advisement is for bowel rest with nutrition via TPN.  Laboratory studies indicate preserved synthetic function of the liver with INR 1.1, total bilirubin 1.2, patient does have chronic low-normal platelets which is not thought to be secondary to cirrhosis. *Continue patient on TPN, continue bowel rest, ice chips as needed for comfort *Pantoprazole 40 mg IV twice daily *Sucralfate 1 g 3 times daily *Phenergan 12.5 mg every  6 hours as needed + Dilaudid 0.5 mg every 4 hours as needed-received Dilaudid 3 times in past 24 hours, no doses of Phenergan given  # Possible alcohol use disorder *CIWA protocol without Ativan *Continue IV thiamine 1-2 mg daily *Continue IV folate 1 g daily  # Urinary retention: Patient with neurogenic bladder, in and out caths at home.  Plan was for in and out catheterization, but Foley placement was difficult with 3 unsuccessful attempts.  Indwelling coud catheter was placed on 2/25  # Left calf pain: Ultrasound was without evidence for DVT.  There was a hypoechoic,  complex structure with no vascularity seen in popliteal fossa. Left leg is now with increase in swelling. Repeat DVT study today without evidence for DVT  PT/OT: Consulted Diet: NPO, except for ice chips. DVT Ppx: Lovenox 40 mg daily Admit Status: Inpatient Dispo: Anticipated discharge pending clinical improvement  Jeanmarie Hubert, MD 08/18/2019, 6:45 AM

## 2019-08-18 NOTE — Progress Notes (Signed)
PHARMACY - TOTAL PARENTERAL NUTRITION CONSULT NOTE   Indication: Severe esophagitis   Patient Measurements: Height: '5\' 6"'$  (167.6 cm) Weight: 134 lb 11.2 oz (61.1 kg) IBW/kg (Calculated) : 59.3 TPN AdjBW (KG): 52.2 Body mass index is 21.74 kg/m. Usual Weight: 61 kg  Assessment: 59 year old female with PMH significant for COPD, HTN, Guillan Barre, quadriplegia, ETOH abuse, and recent admission for colitis now presenting with ascites and colitis on 08/11/2019. Patient has not been eating well since discharge a week prior and feeling that food has been getting stuck in her throat leading to vomiting. Patient has severe muscle depletion and poor po intake due to loss of appetite for >1 month.   Glucose / Insulin: BG 94-111. Currently on D5-1/2NS at 100 mL/hr.  Electrolytes: K 3.5, Mg dropped to 1.5, Phos down at 2.2. CoCa 9.5. Others within normal limits.  Renal: SCr stable at 0.59 LFTs / TGs: Elevated Alk Phos trending down (187). TG elevated at 242. Others within normal limits. Prealbumin / albumin: Pre-albumin 6.4 >>5.3; Alb 1.1.  Intake / Output; MIVF: 1189 mL/ 750 mL, +440 mL last 24 hrs. Net - 1.7L. UOP 0.5 mL/kg/hr. GI Imaging: 08/15/19 CT Abdomen - moderate bilateral pleural effusions, esophagitis and hiatal hernia, large volume ascites Surgeries / Procedures:  08/15/19 - L paracentesis with 900 mL drained 07/28/2019 - EGD: severe esophagitis, 7 cm hiatal hernia, portal hypertensive gastropathy, moderate gastritis, two duodenal polyps which were resected.   Central access: PICC, 08/17/19 TPN start date: 08/17/19  Nutritional Goals (per RD recommendation on 08/17/19): KCal: 1600-1800, Protein: 80-95, Fluid: >= 1.6 L/ day Goal TPN rate is 70 mL/hr (provides 94 g of protein and 1737 kcals per day)  Current Nutrition:  NPO - sips ok  Plan:  Increase TPN to 50 mL/hr at 1800 - Gradual increase with lytes - aggressive repletion.  Electrolytes in TPN: 21mq/L of Na, 526m/L of K, 66m90mL of Ca,  76m23m of Mg, and 20mm63m of Phos. Cl:Ac ratio 1:1 Continue MVI/TE, Thiamine, and folic acid oral tablets for now. Will not add to TPN. If unable to take orals will place in TPN. Initiate Sensitive q6h SSI and adjust as needed  Reduce NS  to 50 mL/hr at 1800  Monitor TPN labs on Mon/Thurs and daily for at least 3 days as titrate up TPN  Give KPhos 15 mmol IV x1 Give Magnesium 2g IV x1  JessiSloan LeiterrmD, BCPS, BCCCP Clinical Pharmacist Clinical phone 08/18/2019 until 3PM - #[phone] refer to [facility] 08/18/2019,7:17 AM

## 2019-08-18 NOTE — Plan of Care (Signed)

## 2019-08-18 NOTE — Progress Notes (Signed)
LEV has been completed.   Preliminary results in CV Proc.   Blanch Media 08/18/2019 11:24 AM

## 2019-08-18 NOTE — Evaluation (Signed)
Physical Therapy Evaluation Patient Details Name: Catherine Butler MRN: [id number] DOB: Oct 31, 1960 Today's Date: 08/18/2019   History of Present Illness  59 year old female with past medical history significant for COPD, hypertension, AIDP, recent hospital admission for colitis (2/14-2/19) who presented on 08/13/2019 with abdominal pain, nausea, and vomiting.  Clinical Impression  Pt presents to PT with deficits in functional mobility, gait, balance, endurance, strength, power, and with pain limiting activity. Pt frequently reporting abdominal pain during session, limiting her activity tolerance. Pt currently requiring minA for all functional mobility and limited gait assessment. Pt is generally weak after multiple hospitalizations and is at an increased risk for falls at this time. Pt will benefit from aggressive mobilization during this admission to improve strength, balance, and activity tolerance. PT recommending discharge home with home health PT and 24/7 assistance, however patient will need at least one more acute PT session due to poor activity tolerance at this time.    Follow Up Recommendations Home health PT;Supervision/Assistance - 24 hour    Equipment Recommendations  3in1 (PT)    Recommendations for Other Services       Precautions / Restrictions Precautions Precautions: Fall Precaution Comments: HR Restrictions Weight Bearing Restrictions: No      Mobility  Bed Mobility Overal bed mobility: Needs Assistance Bed Mobility: Supine to Sit     Supine to sit: Min assist        Transfers Overall transfer level: Needs assistance   Transfers: Sit to/from Stand Sit to Stand: Min assist         General transfer comment: pt performs 4 sit to stands from bedside, needing cues for forward lean  Ambulation/Gait Ambulation/Gait assistance: Min guard Gait Distance (Feet): 5 Feet Assistive device: Rolling walker (2 wheeled) Gait Pattern/deviations: Step-to pattern Gait  velocity: decreased Gait velocity interpretation: <1.8 ft/sec, indicate of risk for recurrent falls General Gait Details: short step to gait, significantly reduced gait speed  Stairs            Wheelchair Mobility    Modified Rankin (Stroke Patients Only)       Balance Overall balance assessment: Needs assistance Sitting-balance support: Single extremity supported;Feet supported Sitting balance-Leahy Scale: Fair Sitting balance - Comments: minG at edge of bed   Standing balance support: Bilateral upper extremity supported Standing balance-Leahy Scale: Fair Standing balance comment: minG with BUE support of RW                             Pertinent Vitals/Pain Pain Assessment: 0-10 Pain Score: 7  Pain Location: UEs and abdomen Pain Descriptors / Indicators: Aching Pain Intervention(s): Limited activity within patient's tolerance    Home Living Family/patient expects to be discharged to:: Private residence Living Arrangements: Children Available Help at Discharge: Family;Available PRN/intermittently;Neighbor Type of Home: House Home Access: Stairs to enter Entrance Stairs-Rails: None Entrance Stairs-Number of Steps: 3 Home Layout: One level Home Equipment: Shower seat;Wheelchair - manual;Cane - single point;Walker - 2 wheels;Bedside commode Additional Comments: amb household distances with RW    Prior Function Level of Independence: Needs assistance   Gait / Transfers Assistance Needed: Uses RW for ambulation.  ADL's / Homemaking Assistance Needed: son assists with housework; independent with self-care        Hand Dominance   Dominant Hand: Right    Extremity/Trunk Assessment   Upper Extremity Assessment Upper Extremity Assessment: Generalized weakness    Lower Extremity Assessment Lower Extremity Assessment: Generalized weakness  Cervical / Trunk Assessment Cervical / Trunk Assessment: Kyphotic  Communication   Communication: No  difficulties  Cognition Arousal/Alertness: Awake/alert Behavior During Therapy: WFL for tasks assessed/performed Overall Cognitive Status: Impaired/Different from baseline Area of Impairment: Problem solving                             Problem Solving: Slow processing;Requires verbal cues        General Comments General comments (skin integrity, edema, etc.): pt on RA, Pt limited by abdominal pain, grimacing and guarding during mobility    Exercises     Assessment/Plan    PT Assessment Patient needs continued PT services  PT Problem List Decreased strength;Decreased activity tolerance;Decreased balance;Decreased mobility;Decreased cognition;Decreased knowledge of use of DME;Decreased safety awareness;Decreased knowledge of precautions;Pain       PT Treatment Interventions DME instruction;Gait training;Stair training;Functional mobility training;Therapeutic activities;Therapeutic exercise;Balance training;Neuromuscular re-education;Patient/family education    PT Goals (Current goals can be found in the Care Plan section)  Acute Rehab PT Goals Patient Stated Goal: To improve mobility and reduce pain PT Goal Formulation: With patient Time For Goal Achievement: 08/22/2019 Potential to Achieve Goals: Good    Frequency Min 3X/week   Barriers to discharge        Co-evaluation               AM-PAC PT "6 Clicks" Mobility  Outcome Measure Help needed turning from your back to your side while in a flat bed without using bedrails?: A Little Help needed moving from lying on your back to sitting on the side of a flat bed without using bedrails?: A Little Help needed moving to and from a bed to a chair (including a wheelchair)?: A Little Help needed standing up from a chair using your arms (e.g., wheelchair or bedside chair)?: A Little Help needed to walk in hospital room?: A Little Help needed climbing 3-5 steps with a railing? : A Lot 6 Click Score: 17    End of  Session   Activity Tolerance: Patient limited by pain Patient left: in chair;with call bell/phone within reach Nurse Communication: Mobility status PT Visit Diagnosis: Muscle weakness (generalized) (M62.81);Pain Pain - Right/Left: Left Pain - part of body: (abdomen)    Time: 1027-2536 PT Time Calculation (min) (ACUTE ONLY): 26 min   Charges:   PT Evaluation $PT Eval Moderate Complexity: 1 Mod PT Treatments $Therapeutic Activity: 8-22 mins        Zenaida Niece, PT, DPT Acute Rehabilitation Pager: [phone]   Zenaida Niece 08/18/2019, 9:58 AM

## 2019-08-19 DIAGNOSIS — R1084 Generalized abdominal pain: Secondary | ICD-10-CM

## 2019-08-19 DIAGNOSIS — Z960 Presence of urogenital implants: Secondary | ICD-10-CM

## 2019-08-19 LAB — GLUCOSE, CAPILLARY
Glucose-Capillary: 130 mg/dL — ABNORMAL HIGH (ref 70–99)
Glucose-Capillary: 124 mg/dL — ABNORMAL HIGH (ref 70–99)
Glucose-Capillary: 125 mg/dL — ABNORMAL HIGH (ref 70–99)
Glucose-Capillary: 136 mg/dL — ABNORMAL HIGH (ref 70–99)

## 2019-08-19 LAB — HEPATIC FUNCTION PANEL
ALT: 16 U/L (ref 0–44)
AST: 18 U/L (ref 15–41)
Albumin: <1 g/dL — ABNORMAL LOW (ref 3.5–5.0)
Alkaline Phosphatase: 162 U/L — ABNORMAL HIGH (ref 38–126)
Bilirubin, Direct: 0.2 mg/dL (ref 0.0–0.2)
Indirect Bilirubin: 0.4 mg/dL (ref 0.3–0.9)
Total Bilirubin: 0.6 mg/dL (ref 0.3–1.2)
Total Protein: 4.2 g/dL — ABNORMAL LOW (ref 6.5–8.1)

## 2019-08-19 LAB — BASIC METABOLIC PANEL
Anion gap: 5 (ref 5–15)
BUN: <5 mg/dL — ABNORMAL LOW (ref 6–20)
CO2: 22 mmol/L (ref 22–32)
Calcium: 7.2 mg/dL — ABNORMAL LOW (ref 8.9–10.3)
Chloride: 110 mmol/L (ref 98–111)
Creatinine, Ser: 0.46 mg/dL (ref 0.44–1.00)
GFR calc Af Amer: >60 mL/min (ref >60)
GFR calc non Af Amer: >60 mL/min (ref >60)
Glucose, Bld: 143 mg/dL — ABNORMAL HIGH (ref 70–99)
Potassium: 3.5 mmol/L (ref 3.5–5.1)
Sodium: 137 mmol/L (ref 135–145)

## 2019-08-19 LAB — PHOSPHORUS: Phosphorus: 2.6 mg/dL (ref 2.5–4.6)

## 2019-08-19 LAB — MAGNESIUM: Magnesium: 1.6 mg/dL — ABNORMAL LOW (ref 1.7–2.4)

## 2019-08-19 MED ORDER — MAGNESIUM SULFATE 2 GM/50ML IV SOLN
2.0000 g | Freq: Once | INTRAVENOUS | Status: AC
  Administered 2019-08-19: 14:00:00 2 g via INTRAVENOUS
  Filled 2019-08-19: qty 50

## 2019-08-19 MED ORDER — POTASSIUM CHLORIDE 10 MEQ/50ML IV SOLN
10.0000 meq | INTRAVENOUS | Status: AC
  Administered 2019-08-19 (×2): 10 meq via INTRAVENOUS
  Filled 2019-08-19: qty 50

## 2019-08-19 MED ORDER — TRAVASOL 10 % IV SOLN
INTRAVENOUS | Status: DC
  Filled 2019-08-19: qty 940.8

## 2019-08-19 NOTE — Progress Notes (Signed)
   Subjective: HD#4   Overnight: No acute events reported  Today, Catherine Butler states that she continues to have pain in her abdomen.  She still has not been able to have any oral intake.  She denies nausea or vomiting.  Objective:  Vital signs in last 24 hours: Vitals:   08/18/19 2332 08/19/19 0300 08/19/19 0402 08/19/19 0409  BP:  117/78    Pulse: (!) 109 (!) 114 (!) 109 (!) 109  Resp:  13  16  Temp:  98.3 F (36.8 C)    TempSrc:  Oral    SpO2:  92%  91%  Weight:      Height:       Const: In no apparent distress, lying comfortably in bed, conversational Resp: CTA BL, no wheezes, crackles, rhonchi CV: Tachycardic, no murmurs, gallop, rub Abd: Bowel sounds present, mildly diffusely tender to palpation with minimal guarding   Assessment/Plan:  Principal Problem:   Nausea with vomiting Active Problems:   Bilateral leg weakness - chronic, after Guillian Barre episode   History of prolonged Q-T interval on ECG   Urinary retention   Colitis   Hematochezia   Ascites   Hypoalbuminemia   Enteritis   Pancreatic atrophy   Generalized abdominal pain   Gastritis and gastroduodenitis   Acute esophagitis   Hiatal hernia  Patient is a 59 year old female with past medical history significant for COPD, chronic disability related to Guillain-Barr syndrome, hypertension, AIDP, recent hospitalization for colitis (2/14-2/19) who presented on 07/30/2019 with abdominal pain, nausea, and vomiting.  # Ascites # Enteritis/Colitis CT abdomen/pelvis with significant changes from prior study including bilateral pleural effusions, circumferential thickening of distal esophagus with a large hiatal hernia, diffuse small bowel edema, large volume ascites with anasarca and pancreatic atrophy.  EGD was performed on 09/06/19 and demonstrated severe esophagitis, and essentially normal appearing small bowel .GI is considering that the diffuse enteritis seen on CT scan may be secondary to significant  hypoalbuminemia secondary to poor nutritional status.  Advisement is for bowel rest with nutrition via TPN.   *Continue patient on TPN, continue bowel rest, ice chips as needed for comfort *Pantoprazole 40 mg IV twice daily *Sucralfate 1 g 3 times daily *Phenergan 12.5 mg every 6 hours as needed + Dilaudid 0.5 mg every 4 hours as needed-received Dilaudid 3 times in past 24 hours, no doses of Phenergan given  # Possible alcohol use disorder *CIWA protocol without Ativan *Continue IV thiamine 1-2 mg daily *Continue IV folate 1 g daily  # Urinary retention: Patient with neurogenic bladder, in and out caths at home.  Plan was for in and out catheterization, but Foley placement was difficult with 3 unsuccessful attempts.  Indwelling coud catheter was placed on 2/25  # Left calf pain: Ultrasound was without evidence for DVT x2. Likely due to protein-calorie malnutrition   PT/OT: HH PT Diet: NPO, except for ice chips. DVT Ppx: Lovenox 40 mg daily Admit Status: Inpatient Dispo: Anticipated discharge pending clinical improvement   Catherine Rack, MD 08/19/2019, 6:05 AM Pager: [phone] Internal Medicine Teaching Service

## 2019-08-19 NOTE — Progress Notes (Addendum)
[facility] ROUNDING NOTE   Subjective: No acute events overnight.  Sitting upright in chair.  Ice chips and sips overnight.  Reviewed path from small bowel enteroscopy: Normal small bowel biopsies without inflammation, villous atrophy, peptic duodenitis, non-H. pylori gastritis.   Objective: Vital signs in last 24 hours: Temp:  [98.3 F (36.8 C)-98.7 F (37.1 C)] 98.3 F (36.8 C) (02/28 1114) Pulse Rate:  [106-118] 111 (02/28 1114) Resp:  [12-17] 14 (02/28 1114) BP: (100-117)/(63-88) 103/88 (02/28 1114) SpO2:  [91 %-97 %] 97 % (02/28 1200) Last BM Date: 08/17/19 General: NAD Lungs:  CTA b/l, no w/r/r Heart:  RRR, no m/r/g Abdomen:  Soft, minimal TTP in epigastrium, ND, +BS Ext: 2+ pitting LE edema bilaterally   Intake/Output from previous day: 02/27 0701 - 02/28 0700 In: 1871.1 [P.O.:60; I.V.:1811.1] Out: 250 [Urine:250] Intake/Output this shift: Total I/O In: 1230.8 [P.O.:60; I.V.:1170.8] Out: 500 [Urine:500]   Lab Results: Recent Labs    08/17/19 0124  WBC 9.5  HGB 8.5*  PLT 158  MCV 105.6*   BMET Recent Labs    08/17/19 0124 08/18/19 0921 08/19/19 0515  NA 134* 135 137  K 3.5 3.5 3.5  CL 106 106 110  CO2 21* 19* 22  GLUCOSE 111* 121* 143*  BUN 7 6 <5*  CREATININE 0.66 0.59 0.46  CALCIUM 7.2* 7.4* 7.2*   LFT Recent Labs    08/17/19 0124 08/19/19 0515  PROT 4.2* 4.2*  ALBUMIN 1.1* <1.0*  AST 20 18  ALT 24 16  ALKPHOS 187* 162*  BILITOT 0.6 0.6  BILIDIR  --  0.2  IBILI  --  0.4   PT/INR No results for input(s): INR in the last 72 hours.    Imaging/Other results: DG Chest Port 1V same Day  Result Date: 08/17/2019 CLINICAL DATA:  PICC line placement EXAM: PORTABLE CHEST 1 VIEW COMPARISON:  08/15/2019 FINDINGS: There is a left-sided PICC line with tip terminating in the region of the cavoatrial junction. The heart size remains enlarged. There are persistent bilateral pleural effusions, left greater than right. There is some  vascular congestion with mild pulmonary edema. Bibasilar airspace opacities are noted in favored to represent atelectasis. Old healed left-sided rib fractures are noted. There is no pneumothorax. IMPRESSION: 1. Well-positioned left-sided PICC line. 2. Persistent small to moderate-sized bilateral pleural effusions, left greater than right. 3. Bibasilar atelectasis is again noted. Electronically Signed   By: Constance Holster M.D.   On: 08/17/2019 14:50   VAS [place] LOWER EXTREMITY VENOUS (DVT)  Result Date: 08/18/2019  Lower Venous DVTStudy Indications: Swelling.  Performing Technologist: Abram Sander RVS  Examination Guidelines: A complete evaluation includes B-mode imaging, spectral Doppler, color Doppler, and power Doppler as needed of all accessible portions of each vessel. Bilateral testing is considered an integral part of a complete examination. Limited examinations for reoccurring indications may be performed as noted. The reflux portion of the exam is performed with the patient in reverse Trendelenburg.  +-----+---------------+---------+-----------+----------+--------------+ RIGHTCompressibilityPhasicitySpontaneityPropertiesThrombus Aging +-----+---------------+---------+-----------+----------+--------------+ CFV  Full           Yes      Yes                                 +-----+---------------+---------+-----------+----------+--------------+   +---------+---------------+---------+-----------+----------+--------------+ LEFT     CompressibilityPhasicitySpontaneityPropertiesThrombus Aging +---------+---------------+---------+-----------+----------+--------------+ CFV      Full           Yes      Yes                                 +---------+---------------+---------+-----------+----------+--------------+  SFJ      Full                                                        +---------+---------------+---------+-----------+----------+--------------+ FV Prox  Full                                                         +---------+---------------+---------+-----------+----------+--------------+ FV Mid   Full                                                        +---------+---------------+---------+-----------+----------+--------------+ FV DistalFull                                                        +---------+---------------+---------+-----------+----------+--------------+ PFV      Full                                                        +---------+---------------+---------+-----------+----------+--------------+ POP      Full           Yes      Yes                                 +---------+---------------+---------+-----------+----------+--------------+ PTV      Full                                                        +---------+---------------+---------+-----------+----------+--------------+ PERO                                                  Not visualized +---------+---------------+---------+-----------+----------+--------------+     Summary: RIGHT: - No evidence of common femoral vein obstruction.  LEFT: - There is no evidence of deep vein thrombosis in the lower extremity.  - No cystic structure found in the popliteal fossa.  *See table(s) above for measurements and observations. Electronically signed by Lemar Livings MD on 08/18/2019 at 3:57:57 PM.    Final       Assessment and Plan:  1) Severe LA Grade D esophagitis 2) large 7 cm hiatal hernia 3) Non-H. pylori gastritis 4) Enteritis on CT 5) Severe hypoalbuminemia/malnutrition 6) Ascites 7) Pancreatic atrophy on CT 8) Alcohol use disorder  -Resume high-dose PPI and Carafate.  When tolerating p.o., can convert Protonix from IV  to p.o. -Okay to start clears -TPN was initiated more so due to severe malnutrition and suspicion that she had reduced ability for enteral absorption given profound enteritis (probably bowel wall edema from hypoalbuminemia).   Otherwise, as she tolerates increasing p.o. intake, can hopefully wean from TPN in the coming days -Albumin decreased on today's labs.  Prealbumin was 5.3, again all indicative of severe malnutrition -Replete magnesium -Daily electrolytes with repletion's as needed -Paracentesis completed earlier on this admission; negative for SBP, cytology, culture, Gram stain -Depending on response to therapy, may also consider trial of Creon -As p.o. intake improves, please watch for Refeeding Syndrome    Verlin Dike Nayel Purdy, DO  08/19/2019, 1:15 PM [facility] Pager [phone]

## 2019-08-19 NOTE — Evaluation (Signed)
Occupational Therapy Evaluation Patient Details Name: Catherine Butler MRN: [id number] DOB: June 12, 1961 Today's Date: 08/19/2019    History of Present Illness 59 year old female with past medical history significant for COPD, hypertension, AIDP, recent hospital admission for colitis (2/14-2/19) who presented on 08/19/2019 with abdominal pain, nausea, and vomiting.   Clinical Impression   Pt PTA: Pt living at home with family support. Pt currently limited by poor activity tolerance, decreased strength and increased pain. Pt with weakness and inability to ambulate more than 6' at a time. Pt standing at sink for light grooming x2.5 mins before requiring seated rest break. Pt modA overall for ADL and minA for mobility. Pt would greatly benefit from continued OT skilled services for ADL, mobility and safety. OT following acutely.  HR 90s; O2 91% on RA.      Follow Up Recommendations  Home health OT;Supervision/Assistance - 24 hour    Equipment Recommendations  None recommended by OT    Recommendations for Other Services       Precautions / Restrictions Precautions Precautions: Fall Precaution Comments: HR Restrictions Weight Bearing Restrictions: No      Mobility Bed Mobility Overal bed mobility: Needs Assistance Bed Mobility: Supine to Sit              Transfers Overall transfer level: Needs assistance   Transfers: Sit to/from Stand Sit to Stand: Min assist         General transfer comment: requires boost from chair at sink, but miguardA from elevated bed height    Balance Overall balance assessment: Needs assistance Sitting-balance support: Single extremity supported;Feet supported Sitting balance-Leahy Scale: Fair     Standing balance support: Single extremity supported;During functional activity Standing balance-Leahy Scale: Fair                             ADL either performed or assessed with clinical judgement   ADL Overall ADL's : Needs  assistance/impaired Eating/Feeding: NPO   Grooming: Min guard;Standing;Sitting   Upper Body Bathing: Minimal assistance;Sitting;Standing;Cueing for sequencing   Lower Body Bathing: Moderate assistance;Sitting/lateral leans;Sit to/from stand;Cueing for safety   Upper Body Dressing : Minimal assistance;Sitting   Lower Body Dressing: Moderate assistance;Cueing for safety;Sitting/lateral leans;Sit to/from stand   Toilet Transfer: Minimal assistance;Ambulation;RW   Toileting- Clothing Manipulation and Hygiene: Moderate assistance;Cueing for safety;Sitting/lateral lean;Sit to/from stand       Functional mobility during ADLs: Minimal assistance;Cueing for safety;Rolling walker General ADL Comments: pt limited by cognition, impaired balance, decreased activity tolerance     Vision Baseline Vision/History: No visual deficits Vision Assessment?: No apparent visual deficits     Perception     Praxis      Pertinent Vitals/Pain Pain Assessment: 0-10 Pain Score: 7  Pain Location: UEs and abdomen Pain Descriptors / Indicators: Discomfort Pain Intervention(s): Limited activity within patient's tolerance;Premedicated before session;Monitored during session     Hand Dominance Right   Extremity/Trunk Assessment Upper Extremity Assessment Upper Extremity Assessment: Generalized weakness;RUE deficits/detail;LUE deficits/detail RUE Deficits / Details: weakness 3-/5; edema LUE Deficits / Details: weakness 3-/5; edema   Lower Extremity Assessment Lower Extremity Assessment: Generalized weakness   Cervical / Trunk Assessment Cervical / Trunk Assessment: Kyphotic   Communication Communication Communication: Expressive difficulties(slow speech, often takes increased time to respond)   Cognition Arousal/Alertness: Lethargic Behavior During Therapy: Flat affect Overall Cognitive Status: Impaired/Different from baseline Area of Impairment: Problem solving  Problem Solving: Slow processing;Requires verbal cues     General Comments  Pt on RA 91% O2 with intermittent decent wave forms    Exercises     Shoulder Instructions      Home Living Family/patient expects to be discharged to:: Private residence Living Arrangements: Children Available Help at Discharge: Family;Available PRN/intermittently;Neighbor Type of Home: House Home Access: Stairs to enter [employer] of Steps: 3 Entrance Stairs-Rails: None Home Layout: One level   Alternate Level Stairs-Rails: None Bathroom Shower/Tub: Teacher, early years/pre: Standard Bathroom Accessibility: Yes   Home Equipment: Shower seat;Wheelchair - manual;Cane - single point;Walker - 2 wheels;Bedside commode   Additional Comments: amb household distances with RW      Prior Functioning/Environment Level of Independence: Needs assistance  Gait / Transfers Assistance Needed: Uses RW for ambulation. ADL's / Homemaking Assistance Needed: son assists with housework; independent with self-care            OT Problem List: Decreased strength;Decreased activity tolerance;Impaired balance (sitting and/or standing);Decreased coordination;Decreased safety awareness;Decreased knowledge of use of DME or AE;Cardiopulmonary status limiting activity;Pain;Impaired UE functional use      OT Treatment/Interventions: Self-care/ADL training;DME and/or AE instruction;Therapeutic exercise;Balance training;Patient/family education;Therapeutic activities;Energy conservation    OT Goals(Current goals can be found in the care plan section) Acute Rehab OT Goals Patient Stated Goal: To improve mobility and reduce pain OT Goal Formulation: With patient Time For Goal Achievement: 09/02/19 Potential to Achieve Goals: Good ADL Goals Pt Will Perform Grooming: with supervision;standing Pt Will Perform Lower Body Dressing: with min guard assist;sitting/lateral leans;sit to/from stand Pt  Will Transfer to Toilet: with supervision;ambulating;bedside commode Pt/caregiver will Perform Home Exercise Program: Increased strength;Both right and left upper extremity;With Supervision Additional ADL Goal #1: Pt will state 3 ways to conserve energy to optimize ADL and IADL routines,  OT Frequency: Min 2X/week   Barriers to D/C:            Co-evaluation              AM-PAC OT "6 Clicks" Daily Activity     Outcome Measure Help from another person eating meals?: Total Help from another person taking care of personal grooming?: A Little Help from another person toileting, which includes using toliet, bedpan, or urinal?: A Little Help from another person bathing (including washing, rinsing, drying)?: A Lot Help from another person to put on and taking off regular upper body clothing?: A Little Help from another person to put on and taking off regular lower body clothing?: A Lot 6 Click Score: 14   End of Session Equipment Utilized During Treatment: Rolling walker;Gait belt Nurse Communication: Mobility status  Activity Tolerance: Patient tolerated treatment well Patient left: in chair;with call bell/phone within reach  OT Visit Diagnosis: Muscle weakness (generalized) (M62.81);Pain Pain - part of body: (abdomen)                Time: 6962-9528 OT Time Calculation (min): 30 min Charges:  OT General Charges $OT Visit: 1 Visit OT Evaluation $OT Eval Moderate Complexity: 1 Mod OT Treatments $Self Care/Home Management : 8-22 mins  Jefferey Pica, OTR/L Acute Rehabilitation Services Pager: [phone] Office: [place] 08/19/2019, 9:17 AM

## 2019-08-19 NOTE — Progress Notes (Signed)
PHARMACY - TOTAL PARENTERAL NUTRITION CONSULT NOTE   Indication: Severe esophagitis   Patient Measurements: Height: '5\' 6"'$  (167.6 cm) Weight: 134 lb 11.2 oz (61.1 kg) IBW/kg (Calculated) : 59.3 TPN AdjBW (KG): 52.2 Body mass index is 21.74 kg/m. Usual Weight: 61 kg  Assessment: 59 year old female with PMH significant for COPD, HTN, [place], quadriplegia, ETOH abuse, and recent admission for colitis now presenting with ascites and colitis on 08/11/2019. Patient has not been eating well since discharge a week prior and feeling that food has been getting stuck in her throat leading to vomiting. Patient has severe muscle depletion and poor po intake due to loss of appetite for >1 month.   Glucose / Insulin: CBG 120-140 with increase in TPN. 3 units of sSSI in last 24 hours.  Electrolytes: K 3.5, Mg remains low at 1.6 - will replace, Phos improved at 2.6. CoCa 9.5. Others within normal limits.  Renal: SCr stable at 0.46, BUN <5 LFTs / TGs: Elevated Alk Phos trending down (187). TG elevated at 242. Others within normal limits. Prealbumin / albumin: Pre-albumin 6.4 >>5.3; Alb 1.1.  Intake / Output; MIVF: 1871  mL/ 250? mL, +1621 mL last 24 hrs. Net - 162L. UOP 0.2 mL/kg/hr? 1+ edema of lower extremities noted. LBM 2/26. GI Imaging: 08/15/19 CT Abdomen - moderate bilateral pleural effusions, esophagitis and hiatal hernia, large volume ascites Surgeries / Procedures:  08/15/19 - L paracentesis with 900 mL drained 08/17/2019 - EGD: severe esophagitis, 7 cm hiatal hernia, portal hypertensive gastropathy, moderate gastritis, two duodenal polyps which were resected.   Central access: PICC, 08/17/19 TPN start date: 08/17/19  Nutritional Goals (per RD recommendation on 08/17/19): KCal: 1600-1800, Protein: 80-95, Fluid: >= 1.6 L/ day Goal TPN rate is 70 mL/hr (provides 94 g of protein and 1737 kcals per day)  Current Nutrition:  NPO - sips ok  Plan:  Increase TPN to goal rate of 70 mL/hr at  1800. Electrolytes in TPN: 18mq/L of Na, 592m/L of K, 90m67mL of Ca, 79m46m of Mg, and 190mm2m of Phos. Cl:Ac ratio 1:1 Continue MVI/TE as oral for now. Add Thiamine and folic acid to TPN and discontinue IV pushes.  Continue Sensitive q6h SSI and adjust as needed  Follow-up CBGs with increase to goal rate and possible need to increase insulin coverage.  Stop NS at 1800 with new TPN at goal rate and LE edema noted.  Monitor TPN labs on Mon/Thurs Follow-up ability to take po intake versus Cortrak placement   Give KCl 10 mEq IV2 x  Give Magnesium 2g IV x1  JessiSloan LeiterrmD, BCPS, BCCCP Clinical Pharmacist Clinical phone 08/19/2019 until 3PM - #[phone] refer to [facility] 08/19/2019,7:26 AM

## 2019-08-20 ENCOUNTER — Ambulatory Visit (INDEPENDENT_AMBULATORY_CARE_PROVIDER_SITE_OTHER): Payer: Medicaid Other | Admitting: Primary Care

## 2019-08-20 DIAGNOSIS — E43 Unspecified severe protein-calorie malnutrition: Secondary | ICD-10-CM

## 2019-08-20 DIAGNOSIS — R188 Other ascites: Secondary | ICD-10-CM

## 2019-08-20 DIAGNOSIS — G9340 Encephalopathy, unspecified: Secondary | ICD-10-CM

## 2019-08-20 DIAGNOSIS — K2100 Gastro-esophageal reflux disease with esophagitis, without bleeding: Secondary | ICD-10-CM

## 2019-08-20 DIAGNOSIS — E8809 Other disorders of plasma-protein metabolism, not elsewhere classified: Secondary | ICD-10-CM

## 2019-08-20 LAB — DIFFERENTIAL
Abs Immature Granulocytes: 0.08 10*3/uL — ABNORMAL HIGH (ref 0.00–0.07)
Basophils Absolute: 0 10*3/uL (ref 0.0–0.1)
Basophils Relative: 0 %
Eosinophils Absolute: 0.1 10*3/uL (ref 0.0–0.5)
Eosinophils Relative: 1 %
Immature Granulocytes: 1 %
Lymphocytes Relative: 20 %
Lymphs Abs: 1.5 10*3/uL (ref 0.7–4.0)
Monocytes Absolute: 1.2 10*3/uL — ABNORMAL HIGH (ref 0.1–1.0)
Monocytes Relative: 15 %
Neutro Abs: 4.7 10*3/uL (ref 1.7–7.7)
Neutrophils Relative %: 63 %

## 2019-08-20 LAB — COMPREHENSIVE METABOLIC PANEL
ALT: 13 U/L (ref 0–44)
AST: 18 U/L (ref 15–41)
Albumin: <1 g/dL — ABNORMAL LOW (ref 3.5–5.0)
Alkaline Phosphatase: 154 U/L — ABNORMAL HIGH (ref 38–126)
Anion gap: 6 (ref 5–15)
BUN: <5 mg/dL — ABNORMAL LOW (ref 6–20)
CO2: 22 mmol/L (ref 22–32)
Calcium: 7.2 mg/dL — ABNORMAL LOW (ref 8.9–10.3)
Chloride: 111 mmol/L (ref 98–111)
Creatinine, Ser: 0.57 mg/dL (ref 0.44–1.00)
GFR calc Af Amer: >60 mL/min (ref >60)
GFR calc non Af Amer: >60 mL/min (ref >60)
Glucose, Bld: 140 mg/dL — ABNORMAL HIGH (ref 70–99)
Potassium: 3.7 mmol/L (ref 3.5–5.1)
Sodium: 139 mmol/L (ref 135–145)
Total Bilirubin: 0.6 mg/dL (ref 0.3–1.2)
Total Protein: 4.5 g/dL — ABNORMAL LOW (ref 6.5–8.1)

## 2019-08-20 LAB — CULTURE, BLOOD (ROUTINE X 2)
Culture: NO GROWTH
Culture: NO GROWTH

## 2019-08-20 LAB — PREALBUMIN: Prealbumin: <5 mg/dL — ABNORMAL LOW (ref 18–38)

## 2019-08-20 LAB — CBC
HCT: 22.8 % — ABNORMAL LOW (ref 36.0–46.0)
Hemoglobin: 7.2 g/dL — ABNORMAL LOW (ref 12.0–15.0)
MCH: 34.6 pg — ABNORMAL HIGH (ref 26.0–34.0)
MCHC: 31.6 g/dL (ref 30.0–36.0)
MCV: 109.6 fL — ABNORMAL HIGH (ref 80.0–100.0)
Platelets: 129 10*3/uL — ABNORMAL LOW (ref 150–400)
RBC: 2.08 MIL/uL — ABNORMAL LOW (ref 3.87–5.11)
RDW: 16.8 % — ABNORMAL HIGH (ref 11.5–15.5)
WBC: 7.6 10*3/uL (ref 4.0–10.5)
nRBC: 0 % (ref 0.0–0.2)

## 2019-08-20 LAB — GLUCOSE, CAPILLARY
Glucose-Capillary: 122 mg/dL — ABNORMAL HIGH (ref 70–99)
Glucose-Capillary: 125 mg/dL — ABNORMAL HIGH (ref 70–99)
Glucose-Capillary: 127 mg/dL — ABNORMAL HIGH (ref 70–99)
Glucose-Capillary: 132 mg/dL — ABNORMAL HIGH (ref 70–99)
Glucose-Capillary: 170 mg/dL — ABNORMAL HIGH (ref 70–99)

## 2019-08-20 LAB — MAGNESIUM: Magnesium: 1.7 mg/dL (ref 1.7–2.4)

## 2019-08-20 LAB — PHOSPHORUS: Phosphorus: 2 mg/dL — ABNORMAL LOW (ref 2.5–4.6)

## 2019-08-20 LAB — CULTURE, BODY FLUID W GRAM STAIN -BOTTLE: Culture: NO GROWTH

## 2019-08-20 LAB — TRIGLYCERIDES: Triglycerides: 208 mg/dL — ABNORMAL HIGH (ref <150)

## 2019-08-20 MED ORDER — POTASSIUM PHOSPHATES 15 MMOLE/5ML IV SOLN
20.0000 mmol | Freq: Once | INTRAVENOUS | Status: AC
  Administered 2019-08-20: 20 mmol via INTRAVENOUS
  Filled 2019-08-20: qty 6.67

## 2019-08-20 MED ORDER — HYDROMORPHONE HCL 1 MG/ML IJ SOLN
0.2500 mg | Freq: Two times a day (BID) | INTRAMUSCULAR | Status: DC | PRN
  Administered 2019-08-20 – 2019-08-21 (×2): 0.25 mg via INTRAVENOUS
  Filled 2019-08-20 (×2): qty 0.5

## 2019-08-20 MED ORDER — TRAVASOL 10 % IV SOLN
INTRAVENOUS | Status: AC
  Filled 2019-08-20: qty 940.8

## 2019-08-20 NOTE — Progress Notes (Signed)
Subjective: HD#5 Events Overnight: Patient had one episode of emesis overnight  Patient was seen this morning on rounds.  Patient states that she has continued to have abdominal pain and nausea with one episode of emesis.  She states that she has not been able to tolerate clear liquids well.  Objective:  Vital signs in last 24 hours: Vitals:   08/20/19 0311 08/20/19 0805 08/20/19 1011 08/20/19 1644  BP:  140/78 (!) 121/95 (!) 121/94  Pulse:  (!) 119  (!) 126  Resp: 16 (!) 21 20 19   Temp:  98.5 F (36.9 C)  99.6 F (37.6 C)  TempSrc:  Oral  Oral  SpO2:  92%  92%  Weight:      Height:        Physical Exam: Physical Exam  Constitutional: She is oriented to person, place, and time.  HENT:  Head: Normocephalic and atraumatic.  Eyes: EOM are normal.  Cardiovascular: Normal rate and intact distal pulses.  Pulmonary/Chest: Effort normal. No respiratory distress.  Abdominal: Soft. She exhibits no distension. There is abdominal tenderness.  Musculoskeletal:        General: No tenderness or edema. Normal range of motion.     Cervical back: Normal range of motion.  Neurological: She is alert and oriented to person, place, and time.  Skin: Skin is warm and dry. She is not diaphoretic.    Filed Weights   07/31/2019 2336 07/25/2019 1656  Weight: 52.2 kg 61.1 kg     Intake/Output Summary (Last 24 hours) at 08/20/2019 1709 Last data filed at 08/20/2019 1704 Gross per 24 hour  Intake 707.53 ml  Output 900 ml  Net -192.47 ml    Pertinent labs/Imaging: CBC Latest Ref Rng & Units 08/20/2019 08/17/2019 07/26/2019  WBC 4.0 - 10.5 K/uL 7.6 9.5 10.7(H)  Hemoglobin 12.0 - 15.0 g/dL 7.2(L) 8.5(L) 8.7(L)  Hematocrit 36.0 - 46.0 % 22.8(L) 26.2(L) 27.2(L)  Platelets 150 - 400 K/uL 129(L) 158 170    CMP Latest Ref Rng & Units 08/20/2019 08/19/2019 08/18/2019  Glucose 70 - 99 mg/dL 140(H) 143(H) 121(H)  BUN 6 - 20 mg/dL <5(L) <5(L) 6  Creatinine 0.44 - 1.00 mg/dL 0.57 0.46 0.59  Sodium 135 - 145  mmol/L 139 137 135  Potassium 3.5 - 5.1 mmol/L 3.7 3.5 3.5  Chloride 98 - 111 mmol/L 111 110 106  CO2 22 - 32 mmol/L 22 22 19(L)  Calcium 8.9 - 10.3 mg/dL 7.2(L) 7.2(L) 7.4(L)  Total Protein 6.5 - 8.1 g/dL 4.5(L) 4.2(L) -  Total Bilirubin 0.3 - 1.2 mg/dL 0.6 0.6 -  Alkaline Phos 38 - 126 U/L 154(H) 162(H) -  AST 15 - 41 U/L 18 18 -  ALT 0 - 44 U/L 13 16 -    No results found.  Assessment/Plan:  Principal Problem:   Protein-calorie malnutrition, severe Active Problems:   Urinary retention   Nausea with vomiting   Colitis   Hematochezia   Ascites   Hypoalbuminemia   Enteritis   Generalized abdominal pain   Gastritis and gastroduodenitis   Acute esophagitis    Patient Summary: JASIA HILTUNEN is a 59 y.o. with pertinent PMH of COPD, HTN, AIDP/GBS, recent hospitalization for colitis (2/14-2/19) admit for diffuse enteritis on hospital day 5  #Ascites #Enteritis/colitis Diffuse enteritis with anasarca 2/2 to significant hypoalbuminemia in the setting of poor nutritional status. - Bowel rest, TPN,  - PPI 40 mg Bid - Sucralfate  - Phenegren  -We will titrate down her Dilaudid with  a goal to discontinue opioid pain medications.   #ETOH use disorder - CIWA without ativan  - Continue Thiamine and folate   #Urinary retention: - Nuerogenic bladder patient will need intermittent cathing for bladder retention.  Diet: TPN IVF: None VTE:. Code: Full PT/OT recs: Pending TOC recs: Pending   Dispo: Anticipated discharge pending clinical improvement.    Dellia Cloud, D.O. [facility]IM[facility], PGY-1 Date 08/20/2019 Time 5:09 PM

## 2019-08-20 NOTE — Progress Notes (Signed)
Physical Therapy Treatment Patient Details Name: Catherine Butler MRN: [id number] DOB: 12-05-1960 Today's Date: 08/20/2019    History of Present Illness 59 year old female with past medical history significant for COPD, hypertension, AIDP, recent hospital admission for colitis (2/14-2/19) who presented on 07/29/2019 with abdominal pain, nausea, and vomiting.    PT Comments    Pt very lethargic today with difficulty maintaining eyes open. Per RN, she did give pt IV dilaudid about 1 hour prior to PT session. Pt's lethargy, delayed response, and mild confusion is inhibiting pt's functional mobility progression. RN aware. Acute PT to cont to follow.    Follow Up Recommendations  Home health PT;Supervision/Assistance - 24 hour     Equipment Recommendations  3in1 (PT)    Recommendations for Other Services       Precautions / Restrictions Precautions Precautions: Fall Precaution Comments: HR Restrictions Weight Bearing Restrictions: No    Mobility  Bed Mobility Overal bed mobility: Needs Assistance Bed Mobility: Sit to Supine       Sit to supine: Min assist   General bed mobility comments: max directional verbal cues  Transfers Overall transfer level: Needs assistance Equipment used: Rolling walker (2 wheeled) Transfers: Sit to/from Stand Sit to Stand: Min assist;Mod assist         General transfer comment: modA to power up from chair, modA to steady during transition of hands, pt with lateral swaying, eyes closed  Ambulation/Gait Ambulation/Gait assistance: Min assist;+2 safety/equipment Gait Distance (Feet): 6 Feet Assistive device: Rolling walker (2 wheeled) Gait Pattern/deviations: Step-to pattern;Decreased stride length;Trunk flexed Gait velocity: decreased Gait velocity interpretation: <1.8 ft/sec, indicate of risk for recurrent falls General Gait Details: pt lethargic, difficulty maintaining eyes open, minA for walker management, had to sit   Stairs              Wheelchair Mobility    Modified Rankin (Stroke Patients Only)       Balance Overall balance assessment: Needs assistance Sitting-balance support: Single extremity supported;Feet supported Sitting balance-Leahy Scale: Fair Sitting balance - Comments: minG at edge of bed   Standing balance support: Single extremity supported;During functional activity Standing balance-Leahy Scale: Poor Standing balance comment: modA to prevent fall due to lethargy                            Cognition Arousal/Alertness: Lethargic Behavior During Therapy: Flat affect Overall Cognitive Status: Impaired/Different from baseline Area of Impairment: Orientation;Attention;Following commands;Safety/judgement;Awareness;Problem solving                 Orientation Level: Disoriented to;Person;Place;Time;Situation(pt stated hospital in [place]) Current Attention Level: Focused Memory: Decreased recall of precautions;Decreased short-term memory Following Commands: Follows one step commands inconsistently;Follows one step commands with increased time;Follows multi-step commands inconsistently Safety/Judgement: Decreased awareness of safety;Decreased awareness of deficits Awareness: Intellectual Problem Solving: Slow processing;Requires verbal cues General Comments: pt very sleepy, hard to maintain eyes open      Exercises      General Comments General comments (skin integrity, edema, etc.): BP dropped from 121/95 to 105/78. Pt pale and very lethargic      Pertinent Vitals/Pain Pain Assessment: Faces Faces Pain Scale: Hurts little more Pain Location: pt places R hand over R flank when asked about pain, pt unable to rate pain and is very lethargic Pain Descriptors / Indicators: Discomfort Pain Intervention(s): Limited activity within patient's tolerance    Home Living  Prior Function            PT Goals (current goals can now be found in  the care plan section) Progress towards PT goals: Not progressing toward goals - comment(pt very lethargic)    Frequency    Min 3X/week      PT Plan Other (comment)(needs re-assessed due to pt with increased lethargy)    Co-evaluation              AM-PAC PT "6 Clicks" Mobility   Outcome Measure  Help needed turning from your back to your side while in a flat bed without using bedrails?: A Little Help needed moving from lying on your back to sitting on the side of a flat bed without using bedrails?: A Little Help needed moving to and from a bed to a chair (including a wheelchair)?: A Little Help needed standing up from a chair using your arms (e.g., wheelchair or bedside chair)?: A Lot Help needed to walk in hospital room?: A Lot Help needed climbing 3-5 steps with a railing? : A Lot 6 Click Score: 15    End of Session Equipment Utilized During Treatment: Gait belt Activity Tolerance: Patient limited by pain Patient left: in bed;with call bell/phone within reach;with bed alarm set Nurse Communication: Mobility status PT Visit Diagnosis: Muscle weakness (generalized) (M62.81);Pain Pain - Right/Left: Left     Time: 1202-1223 PT Time Calculation (min) (ACUTE ONLY): 21 min  Charges:  $Gait Training: 8-22 mins                     Kittie Plater, PT, DPT Acute Rehabilitation Services Pager #: [phone] Office #: [phone]    Berline Lopes 08/20/2019, 12:53 PM

## 2019-08-20 NOTE — Progress Notes (Addendum)
Daily Rounding Note  08/20/2019, 8:26 AM  LOS: 5 days   SUBJECTIVE:   Chief complaint: abd pain, swelling, N/V.      Not c/o abd pain or n/v.  On clears.  No recorded BM's.  Review of systems: Patient on somnolent after receiving Dilaudid earlier, unable to obtain any additional review of systems.  OBJECTIVE:         Vital signs in last 24 hours:    Temp:  [98.3 F (36.8 C)-99.1 F (37.3 C)] 98.5 F (36.9 C) (03/01 0805) Pulse Rate:  [69-126] 119 (03/01 0805) Resp:  [14-27] 21 (03/01 0805) BP: (103-140)/(78-90) 140/78 (03/01 0805) SpO2:  [92 %-100 %] 92 % (03/01 0805) Last BM Date: 08/17/19 Filed Weights   08/18/2019 2336 08-29-2019 1656  Weight: 52.2 kg 61.1 kg   General: looks chronically ill, pale.  Somnolent but arouseable.     Heart: RRR Chest: clear bil, though diminshed BS.  Periodic cough Abdomen: soft, ND, NT.  BS active  Extremities: LE edema w minor pitting.   Neuro/Psych:  Moves all limbs, psycho-motor slowing.  Appropriate.  No tremor  Intake/Output from previous day: 02/28 0701 - 03/01 0700 In: 2394.1 [P.O.:120; I.V.:2274.1] Out: 850 [Urine:850]  Intake/Output this shift: No intake/output data recorded.  Lab Results: Recent Labs    08/20/19 0600  WBC 7.6  HGB 7.2*  HCT 22.8*  PLT 129*   BMET Recent Labs    08/18/19 0921 08/19/19 0515 08/20/19 0600  NA 135 137 139  K 3.5 3.5 3.7  CL 106 110 111  CO2 19* 22 22  GLUCOSE 121* 143* 140*  BUN 6 <5* <5*  CREATININE 0.59 0.46 0.57  CALCIUM 7.4* 7.2* 7.2*   LFT Recent Labs    08/19/19 0515 08/20/19 0600  PROT 4.2* 4.5*  ALBUMIN <1.0* <1.0*  AST 18 18  ALT 16 13  ALKPHOS 162* 154*  BILITOT 0.6 0.6  BILIDIR 0.2  --   IBILI 0.4  --    PT/INR No results for input(s): LABPROT, INR in the last 72 hours. Hepatitis Panel No results for input(s): HEPBSAG, HCVAB, HEPAIGM, HEPBIGM in the last 72 hours.  Studies/Results: VAS [place]  LOWER EXTREMITY VENOUS (DVT)  Result Date: 08/18/2019 Summary: RIGHT: - No evidence of common femoral vein obstruction.  LEFT: - There is no evidence of deep vein thrombosis in the lower extremity.  - No cystic structure found in the popliteal fossa.  *See table(s) above for measurements and observations. Electronically signed by Lemar Livings MD on 08/18/2019 at 3:57:57 PM.    Final    Scheduled Meds: . aspirin EC  81 mg Oral Daily  . bisacodyl  10 mg Rectal Daily  . Chlorhexidine Gluconate Cloth  6 each Topical Daily  . enoxaparin (LOVENOX) injection  40 mg Subcutaneous Q24H  . insulin aspart  0-9 Units Subcutaneous Q6H  . mouth rinse  15 mL Mouth Rinse BID  . multivitamin with minerals  1 tablet Oral Daily  . pantoprazole (PROTONIX) IV  40 mg Intravenous Q12H  . sodium chloride flush  10-40 mL Intracatheter Q12H  . sucralfate  1 g Oral TID WC & HS   Continuous Infusions: . potassium PHOSPHATE IVPB (in mmol)    . TPN ADULT (ION) 70 mL/hr at 08/19/19 1733  . TPN ADULT (ION)     PRN Meds:.albuterol, anti-nausea, HYDROmorphone (DILAUDID) injection, lidocaine (PF), promethazine, sodium chloride flush   ASSESMENT:   *  Abd pain, swelling, pain, n/v.  2/25 enteroscopy: severe esophagitis, large HH, portal gastropathy, duodenal polyps resected.  Path: normal SB; polyp: peptic duodenitis; stomach: mild reactive gastropaty/no HP.   Enteritis, per CT (suspect hypo-albuminemia edema). Bid IV Protonix and Carafate in place  *   PCM, severe. TPN initiated.    Pancreatic atrophy per CT  *   Alcoholism, severe with ETOH fatty liver.  LFTs improving.     *   Ascites, no SBP.    *   Macrocytic anemia.  B12, folate, iron levels not decreased.    *   Tachycardia.    *   Chronic urinary retention, self caths at home.     PLAN   *   Supportive care w TPN.  Watch for rise in LFTs, some expected w TPN.  *   Advance to [place] diet.      Azucena Freed  08/20/2019, 8:26 AM Phone [phone]  [phone]  I have discussed the case with the PA, and that is the plan I formulated. I personally interviewed and examined the patient.  Chief complaint: Ascites I saw this patient along with the primary medical team rounding on her, and discussed the case with the medical resident afterwards.  Patient has had little clinical progress thus far.  She has severe protein calorie malnutrition causing profound hypoalbuminemia and anasarca including ascites and bowel wall edema. 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  Small bowel biopsies do not show any mucosal-based malabsorptive condition.  I expect her progress will be slow, and that she will need TPN over the next week while there is hopeful improvement in her oral intake.  Please be judicious with the use of opioids. When she is able to advance beyond full liquid diet, please start Creon supplementation, at least 60,000 units per meal.  PPI for the severe esophagitis can be converted to oral form when she is consistently taking oral meds.  Total time 30 minutes, inclusive of extensive chart review, in person patient evaluation and discussion with care team.  Nelida Meuse III Office: [phone]

## 2019-08-20 NOTE — Progress Notes (Signed)
PHARMACY - TOTAL PARENTERAL NUTRITION CONSULT NOTE   Indication: Severe esophagitis   Patient Measurements: Height: '5\' 6"'$  (167.6 cm) Weight: 134 lb 11.2 oz (61.1 kg) IBW/kg (Calculated) : 59.3 TPN AdjBW (KG): 52.2 Body mass index is 21.74 kg/m. Usual Weight: 61 kg  Assessment: 59 year old female with PMH significant for COPD, HTN, Guillan Barre, quadriplegia, ETOH abuse, and recent admission for colitis now presenting with ascites and colitis on 07/30/2019. Patient has not been eating well since discharge a week prior and feeling that food has been getting stuck in her throat leading to vomiting. Patient has severe muscle depletion and poor po intake due to loss of appetite for >1 month.   Glucose / Insulin: CBG 120-140 with increase in TPN. 4 units of sSSI in last 24 hours.  Electrolytes: K 3.7, Mg 1.7, Phos low at 2.0 (will replace). CoCa 9.5. Others within normal limits.  Renal: SCr stable at 0.57, BUN <5 LFTs / TGs: Elevated Alk Phos trending down (187>154). TG elevated at 208. Others within normal limits. Prealbumin / albumin: Pre-albumin 6.4 >>5.3>> less than 5; Alb 1.1 >> less than 1.  Intake / Output; MIVF: UOP 0.6 mL/kg/hr 1+ edema of lower extremities noted. LBM 2/26. GI Imaging: 08/15/19 CT Abdomen - moderate bilateral pleural effusions, esophagitis and hiatal hernia, large volume ascites Surgeries / Procedures:  08/15/19 - L paracentesis with 900 mL drained 07/23/2019 - EGD: severe esophagitis, 7 cm hiatal hernia, portal hypertensive gastropathy, moderate gastritis, two duodenal polyps which were resected.   Central access: PICC, 08/17/19 TPN start date: 08/17/19  Nutritional Goals (per RD recommendation on 08/17/19): KCal: 1600-1800, Protein: 80-95, Fluid: >= 1.6 L/ day Goal TPN rate is 70 mL/hr (provides 94 g of protein and 1737 kcals per day)  Current Nutrition:  NPO - sips ok  Plan:  Continue TPN at goal rate of 70 mL/hr. Electrolytes in TPN: 71mq/L of Na, 568m/L of K,  71m29mL of Ca, 78m76m of Mg, and 18mm14m of Phos. Cl:Ac ratio 1:1 Continue MVI/TE as oral for now. Add Thiamine and folic acid to TPN and discontinue IV pushes.  Continue Sensitive q6h SSI and adjust as needed  Give KPhos 20 mmol IV x 1 Monitor TPN labs on Mon/Thurs Follow-up ability to take po intake versus Cortrak placement   CathyAlanda SlimrmD, FCCM [facility] Please see AMION for all Pharmacists' Contact Phone Numbers 08/20/2019, 7:35 AM

## 2019-08-20 DEATH — deceased

## 2019-08-21 ENCOUNTER — Encounter (HOSPITAL_COMMUNITY): Payer: Medicaid Other

## 2019-08-21 ENCOUNTER — Inpatient Hospital Stay (HOSPITAL_COMMUNITY): Payer: Medicaid Other

## 2019-08-21 DIAGNOSIS — R000 Tachycardia, unspecified: Secondary | ICD-10-CM

## 2019-08-21 LAB — BASIC METABOLIC PANEL
Anion gap: 7 (ref 5–15)
Anion gap: 6 (ref 5–15)
Anion gap: 10 (ref 5–15)
BUN: 6 mg/dL (ref 6–20)
BUN: 6 mg/dL (ref 6–20)
BUN: 6 mg/dL (ref 6–20)
CO2: 21 mmol/L — ABNORMAL LOW (ref 22–32)
CO2: 21 mmol/L — ABNORMAL LOW (ref 22–32)
CO2: 20 mmol/L — ABNORMAL LOW (ref 22–32)
Calcium: 7.2 mg/dL — ABNORMAL LOW (ref 8.9–10.3)
Calcium: 7 mg/dL — ABNORMAL LOW (ref 8.9–10.3)
Calcium: 7.7 mg/dL — ABNORMAL LOW (ref 8.9–10.3)
Chloride: 108 mmol/L (ref 98–111)
Chloride: 109 mmol/L (ref 98–111)
Chloride: 106 mmol/L (ref 98–111)
Creatinine, Ser: 0.41 mg/dL — ABNORMAL LOW (ref 0.44–1.00)
Creatinine, Ser: 0.36 mg/dL — ABNORMAL LOW (ref 0.44–1.00)
Creatinine, Ser: 0.47 mg/dL (ref 0.44–1.00)
GFR calc Af Amer: >60 mL/min (ref >60)
GFR calc Af Amer: >60 mL/min (ref >60)
GFR calc Af Amer: >60 mL/min (ref >60)
GFR calc non Af Amer: >60 mL/min (ref >60)
GFR calc non Af Amer: >60 mL/min (ref >60)
GFR calc non Af Amer: >60 mL/min (ref >60)
Glucose, Bld: 122 mg/dL — ABNORMAL HIGH (ref 70–99)
Glucose, Bld: 149 mg/dL — ABNORMAL HIGH (ref 70–99)
Glucose, Bld: 139 mg/dL — ABNORMAL HIGH (ref 70–99)
Potassium: 3.9 mmol/L (ref 3.5–5.1)
Potassium: 3.5 mmol/L (ref 3.5–5.1)
Potassium: 4 mmol/L (ref 3.5–5.1)
Sodium: 136 mmol/L (ref 135–145)
Sodium: 136 mmol/L (ref 135–145)
Sodium: 136 mmol/L (ref 135–145)

## 2019-08-21 LAB — CBC
HCT: 21.1 % — ABNORMAL LOW (ref 36.0–46.0)
HCT: 32.1 % — ABNORMAL LOW (ref 36.0–46.0)
HCT: 28.9 % — ABNORMAL LOW (ref 36.0–46.0)
Hemoglobin: 6.7 g/dL — CL (ref 12.0–15.0)
Hemoglobin: 10 g/dL — ABNORMAL LOW (ref 12.0–15.0)
Hemoglobin: 9.6 g/dL — ABNORMAL LOW (ref 12.0–15.0)
MCH: 34.9 pg — ABNORMAL HIGH (ref 26.0–34.0)
MCH: 32.7 pg (ref 26.0–34.0)
MCH: 33 pg (ref 26.0–34.0)
MCHC: 31.8 g/dL (ref 30.0–36.0)
MCHC: 31.2 g/dL (ref 30.0–36.0)
MCHC: 33.2 g/dL (ref 30.0–36.0)
MCV: 109.9 fL — ABNORMAL HIGH (ref 80.0–100.0)
MCV: 104.9 fL — ABNORMAL HIGH (ref 80.0–100.0)
MCV: 99.3 fL (ref 80.0–100.0)
Platelets: 134 10*3/uL — ABNORMAL LOW (ref 150–400)
Platelets: 150 10*3/uL (ref 150–400)
Platelets: 162 10*3/uL (ref 150–400)
RBC: 1.92 MIL/uL — ABNORMAL LOW (ref 3.87–5.11)
RBC: 3.06 MIL/uL — ABNORMAL LOW (ref 3.87–5.11)
RBC: 2.91 MIL/uL — ABNORMAL LOW (ref 3.87–5.11)
RDW: 16.5 % — ABNORMAL HIGH (ref 11.5–15.5)
RDW: 20.5 % — ABNORMAL HIGH (ref 11.5–15.5)
RDW: 20.8 % — ABNORMAL HIGH (ref 11.5–15.5)
WBC: 6.5 10*3/uL (ref 4.0–10.5)
WBC: 7 10*3/uL (ref 4.0–10.5)
WBC: 8.2 10*3/uL (ref 4.0–10.5)
nRBC: 0 % (ref 0.0–0.2)
nRBC: 0 % (ref 0.0–0.2)
nRBC: 0.2 % (ref 0.0–0.2)

## 2019-08-21 LAB — GLUCOSE, CAPILLARY
Glucose-Capillary: 138 mg/dL — ABNORMAL HIGH (ref 70–99)
Glucose-Capillary: 153 mg/dL — ABNORMAL HIGH (ref 70–99)
Glucose-Capillary: 123 mg/dL — ABNORMAL HIGH (ref 70–99)

## 2019-08-21 LAB — HEMOGLOBIN AND HEMATOCRIT, BLOOD
HCT: 21.1 % — ABNORMAL LOW (ref 36.0–46.0)
Hemoglobin: 6.6 g/dL — CL (ref 12.0–15.0)

## 2019-08-21 LAB — TROPONIN I (HIGH SENSITIVITY)
Troponin I (High Sensitivity): 30 ng/L — ABNORMAL HIGH (ref <18)
Troponin I (High Sensitivity): 42 ng/L — ABNORMAL HIGH (ref <18)

## 2019-08-21 LAB — BLOOD GAS, ARTERIAL
Acid-Base Excess: 0.9 mmol/L (ref 0.0–2.0)
Bicarbonate: 23.5 mmol/L (ref 20.0–28.0)
FIO2: 28
O2 Saturation: 95.7 %
Patient temperature: 36.7
pCO2 arterial: 28.1 mmHg — ABNORMAL LOW (ref 32.0–48.0)
pH, Arterial: 7.53 — ABNORMAL HIGH (ref 7.350–7.450)
pO2, Arterial: 71.1 mmHg — ABNORMAL LOW (ref 83.0–108.0)

## 2019-08-21 LAB — PHOSPHORUS: Phosphorus: 2.6 mg/dL (ref 2.5–4.6)

## 2019-08-21 LAB — PREPARE RBC (CROSSMATCH)

## 2019-08-21 LAB — MAGNESIUM
Magnesium: 1.6 mg/dL — ABNORMAL LOW (ref 1.7–2.4)
Magnesium: 1.7 mg/dL (ref 1.7–2.4)

## 2019-08-21 MED ORDER — HYDROMORPHONE HCL 1 MG/ML IJ SOLN
1.0000 mg | Freq: Once | INTRAMUSCULAR | Status: AC
  Administered 2019-08-21: 1 mg via INTRAVENOUS
  Filled 2019-08-21: qty 1

## 2019-08-21 MED ORDER — MAGNESIUM SULFATE 2 GM/50ML IV SOLN
2.0000 g | Freq: Once | INTRAVENOUS | Status: AC
  Administered 2019-08-21: 2 g via INTRAVENOUS
  Filled 2019-08-21: qty 50

## 2019-08-21 MED ORDER — SODIUM CHLORIDE 0.9% IV SOLUTION
Freq: Once | INTRAVENOUS | Status: AC
  Administered 2019-08-21: 1 mL via INTRAVENOUS

## 2019-08-21 MED ORDER — FUROSEMIDE 10 MG/ML IJ SOLN
20.0000 mg | Freq: Once | INTRAMUSCULAR | Status: AC
  Administered 2019-08-21: 20 mg via INTRAVENOUS
  Filled 2019-08-21: qty 2

## 2019-08-21 MED ORDER — TRAVASOL 10 % IV SOLN
INTRAVENOUS | Status: AC
  Filled 2019-08-21: qty 940.8

## 2019-08-21 MED ORDER — POTASSIUM CHLORIDE 10 MEQ/50ML IV SOLN
10.0000 meq | INTRAVENOUS | Status: AC
  Administered 2019-08-21 (×4): 10 meq via INTRAVENOUS
  Filled 2019-08-21 (×4): qty 50

## 2019-08-21 MED ORDER — HYDROMORPHONE HCL 1 MG/ML IJ SOLN: 0.5000 mg | Freq: Two times a day (BID) | INTRAMUSCULAR | Status: DC | PRN

## 2019-08-21 NOTE — Progress Notes (Signed)
RT Note:  Placed patient on BIPAP per MD order.  While in room patient was pulling at mask. Encouraged patient to try wearing BIPAP to ease her increased WOB.  Patient wore mask for approximately 15 minutes, upon leaving room patient pulled mask off. States she can not tolerate it at this time. Patient states she has pain in her stomach and back. Placed patient on 2 lpm.

## 2019-08-21 NOTE — Progress Notes (Signed)
MD paged regarding pt status. MD at pt bedside assessing pt. Will continue to monitor.

## 2019-08-21 NOTE — TOC Progression Note (Signed)
Transition of Care [facility]) - Progression Note    Patient Details  Name: Catherine Butler MRN: [id number] Date of Birth: 1960/10/13  [facility] of [facility] CM/SW Contact  Leone Haven, RN Phone Number: 08/21/2019, 4:23 PM  Clinical Narrative:    NCM spoke with patient she was very sleepy, NCM came back offered choicle for HHPT, she states she really does not have a preference,  [facility] will be ok , she states she needs a bsc.  NCM made referral to [facility] for HHPT, they were able to take referral, NCM made referral to [facility] with [facility] for bsc, this was brought to patient room.  Patient states she will have transport at discharge.  TOC team will continue to follow for dc needs.     Expected Discharge Plan: Home w Home Health Services Barriers to Discharge: Continued Medical Work up  Expected Discharge Plan and Services Expected Discharge Plan: Home w Home Health Services In-house Referral: NA Discharge Planning Services: CM Consult   Living arrangements for the past 2 months: Single Family Home                 DME Arranged: (NA)         HH Arranged: NA           Social Determinants of Health (SDOH) Interventions    Readmission Risk Interventions Readmission Risk Prevention Plan 08/17/2019 08/17/2019  Transportation Screening Complete Complete  PCP or Specialist Appt within 3-5 Days Complete -  HRI or Home Care Consult (No Data) -  Social Work Consult for Recovery Care Planning/Counseling - Complete  Palliative Care Screening Not Applicable Not Applicable  Medication Review Oceanographer) Complete Complete  Some recent data might be hidden

## 2019-08-21 NOTE — Progress Notes (Signed)
PICC site assessment: LUE PICC appears patent with brisk blood return from both lumen. Flushes easily. Pt defaults to leaning on L elbow despite repositioning and reminders. Consider doppler if edema persists/ worsens.

## 2019-08-21 NOTE — Progress Notes (Signed)
Presented to the bedside for repeat assessment. Patient unable to tolerate BiPAP. Continuously stating she is in pain. Will get ABG and address pain to see if that improves her WOB. Will reassess in 1-2 hours.   Levora Dredge, MD

## 2019-08-21 NOTE — Progress Notes (Deleted)
Internal Medicine Attending Note:  I have seen and evaluated this patient and I have discussed the plan of care with the house staff. Please see their note for complete details. I concur with their findings.  Reymundo Poll, MD 08/21/2019, 1:05 PM

## 2019-08-21 NOTE — Progress Notes (Addendum)
Subjective: HD#6 Events Overnight: She was found to have decreased hemoglobin this morning at 6.7 with repeat of 6.6.  Patient was seen this morning on rounds.  She was found to appear extremely uncomfortable as she was actively nauseous and dry heaving.  On further questioning, she denied hemoptysis, hematochezia, bright red blood per rectum.  Objective:  Vital signs in last 24 hours: Vitals:   08/20/19 1918 08/20/19 2118 08/20/19 2307 08/21/19 0256  BP: (!) 138/92  (!) 135/91 (!) 135/92  Pulse: 64 (!) 128 (!) 120 (!) 129  Resp: 19  19 20   Temp: 99.4 F (37.4 C)  99.9 F (37.7 C) 98.7 F (37.1 C)  TempSrc: Oral  Oral Oral  SpO2: 95% 96% 95% 94%  Weight:      Height:        Physical Exam: Physical Exam  Cardiovascular: Normal heart sounds. Tachycardia present. Exam reveals no friction rub.  No murmur heard. Pulmonary/Chest: Effort normal and breath sounds normal. She has no wheezes. She has no rales.  Musculoskeletal:        General: Edema (LLE) present.    Filed Weights   Sep 07, 2019 2336 07/23/2019 1656  Weight: 52.2 kg 61.1 kg     Intake/Output Summary (Last 24 hours) at 08/21/2019 0520 Last data filed at 08/20/2019 2300 Gross per 24 hour  Intake 652.77 ml  Output 1375 ml  Net -722.23 ml    Pertinent labs/Imaging: CBC Latest Ref Rng & Units 08/20/2019 08/17/2019 08/19/2019  WBC 4.0 - 10.5 K/uL 7.6 9.5 10.7(H)  Hemoglobin 12.0 - 15.0 g/dL 7.2(L) 8.5(L) 8.7(L)  Hematocrit 36.0 - 46.0 % 22.8(L) 26.2(L) 27.2(L)  Platelets 150 - 400 K/uL 129(L) 158 170    CMP Latest Ref Rng & Units 08/21/2019 08/20/2019 08/19/2019  Glucose 70 - 99 mg/dL 08/21/2019) 102(H) 852(D)  BUN 6 - 20 mg/dL 6 782(U) <2(P)  Creatinine 0.44 - 1.00 mg/dL <5(T) 6.14(E 3.15  Sodium 135 - 145 mmol/L 136 139 137  Potassium 3.5 - 5.1 mmol/L 3.9 3.7 3.5  Chloride 98 - 111 mmol/L 108 111 110  CO2 22 - 32 mmol/L 21(L) 22 22  Calcium 8.9 - 10.3 mg/dL 7.2(L) 7.2(L) 7.2(L)  Total Protein 6.5 - 8.1 g/dL - 4.5(L)  4.2(L)  Total Bilirubin 0.3 - 1.2 mg/dL - 0.6 0.6  Alkaline Phos 38 - 126 U/L - 154(H) 162(H)  AST 15 - 41 U/L - 18 18  ALT 0 - 44 U/L - 13 16    No results found.  Assessment/Plan:  Principal Problem:   Protein-calorie malnutrition, severe Active Problems:   Urinary retention   Nausea with vomiting   Colitis   Hematochezia   Ascites   Hypoalbuminemia   Enteritis   Generalized abdominal pain   Gastritis and gastroduodenitis   Acute esophagitis   Encephalopathy    Patient Summary: Catherine Butler is a 59 y.o. with pertinent PMH of COPD, hypertension, AIDP/GBS, recent hospitalization for colitis, admit for diffuse enteritis on hospital day 6  #Anasarca #Enteritis She continued to complain of abdominal pain.  This morning, she was found extremely nauseous and dry heaving.  It is distressing to watch her not any significant improvement in her clinical course, my hope is that with TPN and bowel rest her bowel wall edema and enteritis will slowly improve. -Continue bowel rest with TPN -Continue PPI, sucralfate, Phenergan -We will continue to titrate down her Dilaudid   #Acute on chronic macrocytic anemia Her baseline hemoglobin is labile between 8-10.  Overnight, she has had a drop in hemoglobin from 7.2>>6.7>>6.6.  She currently denies hematochezia, hemoptysis, hematemesis or bright red blood per rectum.  Per chart review, she did have positive FOBT on initial presentation.  She has only had an EGD during this visit.  Her last colonoscopy was in 2019 which was unremarkable.. Her MCV is elevated at 109.  Vitamin B12 level was checked on 08/08/2019 and was 4394.  Folate was also checked and level was 268, and iron panel reviewed and elevated saturation of 39, ferritin of 219. -Transfuse 1 unit of PRBC -Follow-up posttransfusion CBC -Transfuse when hemoglobin less than 7 -I believe she might warrant colonoscopy and will await GI recommendation -Discontinue Lovenox and place SCDs  -If hemoglobin continues to consistently decrease, will order hemolytic labs and CT abdomen to examine for peritoneal bleed.   #Tachycardia Her HR has remained in the 120s.  This is certainly multifactorial from her ongoing bodily distress as she has been nauseous and several episodes of emesis.  In addition, her acute drop in hemoglobin could be an explanation.  She does not appear dehydrated on clinical exam.  Repeat EKG this morning revealed sinus tachycardia -Continue to monitor   #EtOH use disorder -CIWA without Ativan -Continue thiamine and folate supplementation   #Urinary retention -Continue intermittent catheterization   Diet: TPN IVF: None VTE: Discontinue Lovenox in place SCDs Code: Full code PT/OT recs: Pending TOC recs: Pending   Dispo: Anticipated discharge when medically stable.    Marianna Payment, D.O. [facility], PGY-1 Date 08/21/2019 Time 5:20 AM

## 2019-08-21 NOTE — Progress Notes (Signed)
Paged MD regarding pts Hgb lab results. MD to discuss with treatment team and place orders later this am. Will continue to monitor pt.

## 2019-08-21 NOTE — Progress Notes (Signed)
Pt become tachypneic around 1300, HR 120-130s, RR 40-45, mild wheezing. Page MD, gave pt nebulizer treatment. New orders place when MD around on pt. Pt had output after one dose of lasix, 20mg . Pt more comfortable after one dose of dilaudid given. MD rounded again this afternoon. Pt sleepy and comfortable. Will continue to monitor pt.

## 2019-08-21 NOTE — Progress Notes (Signed)
OT Cancellation Note  Patient Details Name: Catherine Butler MRN: [id number] DOB: 1961-05-14   Cancelled Treatment:    Reason Eval/Treat Not Completed: Patient not medically ready;Fatigue/lethargy limiting ability to participate(Pt with low Hgb and receiving blood. )  OT to continue to follow.  Flora Lipps, OTR/L Acute Rehabilitation Services Pager: [phone] Office: [phone]   Flora Lipps 08/21/2019, 12:04 PM

## 2019-08-21 NOTE — Progress Notes (Signed)
PHARMACY - TOTAL PARENTERAL NUTRITION CONSULT NOTE   Indication: Severe esophagitis   Patient Measurements: Height: '5\' 6"'$  (167.6 cm) Weight: 134 lb 11.2 oz (61.1 kg) IBW/kg (Calculated) : 59.3 TPN AdjBW (KG): 52.2 Body mass index is 21.74 kg/m. Usual Weight: 61 kg  Assessment: 59 year old female with PMH significant for COPD, HTN, Guillan Barre, quadriplegia, ETOH abuse, and recent admission for colitis now presenting with ascites and colitis on 07/29/2019. Patient has not been eating well since discharge a week prior and feeling that food has been getting stuck in her throat leading to vomiting. Patient has severe muscle depletion and poor po intake due to loss of appetite for >1 month.   Glucose / Insulin: CBG 130-170 with increase in TPN. 5 units of sSSI in last 24 hours.  Electrolytes: K 3.5 (replace), Mg 1.6 (replace), Phos 2.6. CoCa 9.5. Others within normal limits.  Renal: SCr stable at 0.36, BUN 6 LFTs / TGs: Elevated Alk Phos trending down (187>154). TG elevated at 208. Others within normal limits. Prealbumin / albumin: Pre-albumin 6.4 >>5.3>> less than 5; Alb 1.1 >> less than 1.  Intake / Output; MIVF: UOP 0.9 mL/kg/hr, emesis x 1 1+ edema of lower extremities noted. LBM 2/26. GI Imaging: 08/15/19 CT Abdomen - moderate bilateral pleural effusions, esophagitis and hiatal hernia, large volume ascites Surgeries / Procedures:  08/15/19 - L paracentesis with 900 mL drained 08/02/2019 - EGD: severe esophagitis, 7 cm hiatal hernia, portal hypertensive gastropathy, moderate gastritis, two duodenal polyps which were resected.   Central access: PICC, 08/17/19 TPN start date: 08/17/19  Nutritional Goals (per RD recommendation on 08/17/19): KCal: 1600-1800, Protein: 80-95, Fluid: >= 1.6 L/ day Goal TPN rate is 70 mL/hr (provides 94 g of protein and 1737 kcals per day)  Current Nutrition:  3/1 FLD  Plan:  Continue TPN at goal rate of 70 mL/hr. Electrolytes in TPN: 78mq/L of Na, 663m/L of  K, 37m79mL of Ca, 137m74m of Mg, and 18mm90m of Phos. Cl:Ac ratio 1:1 Continue MVI/TE as oral for now. Add Thiamine and folic acid to TPN and discontinue IV pushes.  Continue Sensitive q6h SSI and adjust as needed  Give KCl 10 meq x 4 Give Mag 2 gm IV x 1 Monitor labs  Follow-up ability to take po intake versus Cortrak placement   CathyAlanda SlimrmD, FCCM [place] [facility] Please see AMION for all Pharmacists' Contact Phone Numbers 08/21/2019, 7:24 AM

## 2019-08-21 NOTE — Plan of Care (Signed)
Paged regarding tachypnea.   Went to evaluate the patient who was visibly tachypnic with an increased work of breathing. She states her breathing has been like this for days but may have gotten a little worse today. She had a coughing fit while we were in the room which was productive of sputum.   Today's Vitals   08/21/19 1600 08/21/19 1900 08/21/19 1905 08/21/19 2019  BP: 104/86 106/87    Pulse: (!) 118 (!) 130 (!) 131 (!) 131  Resp: 20 (!) 21 (!) 33 (!) 36  Temp: 98.2 F (36.8 C) 98.9 F (37.2 C)    TempSrc: Oral Oral    SpO2: 95% 98% 100% 100%  Weight:      Height:      PainSc:   4     Body mass index is 21.74 kg/m.   Her lungs were clear to auscultation bilaterally. There were transmitted upper airway sounds. Nevertheless, her breathing is concerning in the setting of having a recent blood transfusion for anemia and a chest x-ray today which demonstrated unilateral opacities of the right lung.   Plan: -stat CBC -BIPAP

## 2019-08-22 ENCOUNTER — Ambulatory Visit: Payer: Medicaid Other | Admitting: Nurse Practitioner

## 2019-08-22 ENCOUNTER — Inpatient Hospital Stay (HOSPITAL_COMMUNITY): Payer: Medicaid Other

## 2019-08-22 DIAGNOSIS — E873 Alkalosis: Secondary | ICD-10-CM

## 2019-08-22 DIAGNOSIS — R064 Hyperventilation: Secondary | ICD-10-CM

## 2019-08-22 LAB — BASIC METABOLIC PANEL
Anion gap: 7 (ref 5–15)
BUN: 7 mg/dL (ref 6–20)
CO2: 23 mmol/L (ref 22–32)
Calcium: 7.4 mg/dL — ABNORMAL LOW (ref 8.9–10.3)
Chloride: 105 mmol/L (ref 98–111)
Creatinine, Ser: 0.41 mg/dL — ABNORMAL LOW (ref 0.44–1.00)
GFR calc Af Amer: >60 mL/min (ref >60)
GFR calc non Af Amer: >60 mL/min (ref >60)
Glucose, Bld: 123 mg/dL — ABNORMAL HIGH (ref 70–99)
Potassium: 3.8 mmol/L (ref 3.5–5.1)
Sodium: 135 mmol/L (ref 135–145)

## 2019-08-22 LAB — CBC
HCT: 25.6 % — ABNORMAL LOW (ref 36.0–46.0)
Hemoglobin: 8.5 g/dL — ABNORMAL LOW (ref 12.0–15.0)
MCH: 36.2 pg — ABNORMAL HIGH (ref 26.0–34.0)
MCHC: 33.2 g/dL (ref 30.0–36.0)
MCV: 108.9 fL — ABNORMAL HIGH (ref 80.0–100.0)
Platelets: 142 10*3/uL — ABNORMAL LOW (ref 150–400)
RBC: 2.35 MIL/uL — ABNORMAL LOW (ref 3.87–5.11)
RDW: 21.2 % — ABNORMAL HIGH (ref 11.5–15.5)
WBC: 7.5 10*3/uL (ref 4.0–10.5)
nRBC: 0 % (ref 0.0–0.2)

## 2019-08-22 LAB — TYPE AND SCREEN
ABO/RH(D): A POS
Antibody Screen: NEGATIVE
Unit division: 0

## 2019-08-22 LAB — BPAM RBC
Blood Product Expiration Date: 202103092359
ISSUE DATE / TIME: 202103021006
Unit Type and Rh: 600

## 2019-08-22 LAB — GLUCOSE, CAPILLARY
Glucose-Capillary: 135 mg/dL — ABNORMAL HIGH (ref 70–99)
Glucose-Capillary: 138 mg/dL — ABNORMAL HIGH (ref 70–99)
Glucose-Capillary: 139 mg/dL — ABNORMAL HIGH (ref 70–99)
Glucose-Capillary: 152 mg/dL — ABNORMAL HIGH (ref 70–99)
Glucose-Capillary: 188 mg/dL — ABNORMAL HIGH (ref 70–99)

## 2019-08-22 LAB — MAGNESIUM: Magnesium: 2 mg/dL (ref 1.7–2.4)

## 2019-08-22 LAB — LACTIC ACID, PLASMA: Lactic Acid, Venous: 1.1 mmol/L (ref 0.5–1.9)

## 2019-08-22 LAB — PHOSPHORUS: Phosphorus: 3.2 mg/dL (ref 2.5–4.6)

## 2019-08-22 MED ORDER — LEVALBUTEROL HCL 0.63 MG/3ML IN NEBU
0.6300 mg | INHALATION_SOLUTION | Freq: Four times a day (QID) | RESPIRATORY_TRACT | Status: DC | PRN
  Administered 2019-08-22: 0.63 mg via RESPIRATORY_TRACT

## 2019-08-22 MED ORDER — ENSURE ENLIVE PO LIQD
237.0000 mL | Freq: Three times a day (TID) | ORAL | Status: DC
  Administered 2019-08-23 – 2019-08-31 (×9): 237 mL via ORAL

## 2019-08-22 MED ORDER — TRAVASOL 10 % IV SOLN
INTRAVENOUS | Status: AC
  Filled 2019-08-22: qty 940.8

## 2019-08-22 MED ORDER — POTASSIUM CHLORIDE 10 MEQ/50ML IV SOLN
10.0000 meq | INTRAVENOUS | Status: AC
  Administered 2019-08-22 (×2): 10 meq via INTRAVENOUS
  Filled 2019-08-22 (×2): qty 50

## 2019-08-22 MED ORDER — IPRATROPIUM-ALBUTEROL 0.5-2.5 (3) MG/3ML IN SOLN: 3.0000 mL | Freq: Four times a day (QID) | RESPIRATORY_TRACT | Status: DC

## 2019-08-22 MED ORDER — PREDNISONE 20 MG PO TABS
40.0000 mg | ORAL_TABLET | Freq: Once | ORAL | Status: AC
  Administered 2019-08-22: 40 mg via ORAL
  Filled 2019-08-22: qty 2

## 2019-08-22 MED ORDER — HYDROMORPHONE HCL 1 MG/ML IJ SOLN: 0.5000 mg | Freq: Four times a day (QID) | INTRAMUSCULAR | Status: DC | PRN

## 2019-08-22 MED ORDER — SODIUM CHLORIDE 0.9 % IV SOLN
1.5000 g | Freq: Four times a day (QID) | INTRAVENOUS | Status: DC
  Administered 2019-08-22 – 2019-08-27 (×20): 1.5 g via INTRAVENOUS
  Filled 2019-08-22 (×6): qty 4
  Filled 2019-08-22 (×2): qty 1.5
  Filled 2019-08-22 (×6): qty 4
  Filled 2019-08-22: qty 1.5
  Filled 2019-08-22: qty 4
  Filled 2019-08-22: qty 1.5
  Filled 2019-08-22: qty 4
  Filled 2019-08-22: qty 1.5
  Filled 2019-08-22: qty 4
  Filled 2019-08-22: qty 1.5
  Filled 2019-08-22: qty 4

## 2019-08-22 MED ORDER — PROCHLORPERAZINE EDISYLATE 10 MG/2ML IJ SOLN
10.0000 mg | Freq: Once | INTRAMUSCULAR | Status: AC
  Administered 2019-08-22: 10 mg via INTRAVENOUS
  Filled 2019-08-22: qty 2

## 2019-08-22 MED ORDER — HYDROMORPHONE HCL 1 MG/ML IJ SOLN
0.5000 mg | Freq: Four times a day (QID) | INTRAMUSCULAR | Status: DC | PRN
  Administered 2019-08-22 – 2019-09-01 (×20): 0.5 mg via INTRAVENOUS
  Filled 2019-08-22: qty 0.5
  Filled 2019-08-22: qty 1
  Filled 2019-08-22 (×2): qty 0.5
  Filled 2019-08-22: qty 1
  Filled 2019-08-22: qty 0.5
  Filled 2019-08-22 (×3): qty 1
  Filled 2019-08-22: qty 0.5
  Filled 2019-08-22: qty 1
  Filled 2019-08-22 (×9): qty 0.5

## 2019-08-22 MED ORDER — HYDROMORPHONE HCL 1 MG/ML IJ SOLN: 0.5000 mg | Freq: Three times a day (TID) | INTRAMUSCULAR | Status: DC | PRN

## 2019-08-22 NOTE — Progress Notes (Addendum)
Went to re-evaluate the patient. She states that she is having 10/10 pain in her abdomen. She says that it hurst the worse around her umbilicus. Pain does radiate. She states that she is also having a difficult time breathing with an occasional cough. On exam, she is tachycardia and tachypnic. She is on room air and desaturating to 88%. She is having diffuse abdominal pain with guarding. She has course breath sounds in the upper lung fields with right sided rales. Patient confirmed full code status.   Vitals:   08/22/19 1425 08/22/19 1530  BP:  (!) 128/98  Pulse: (!) 129 (!) 124  Resp: (!) 29 (!) 34  Temp:  98.6 F (37 C)  SpO2: 92% 92%   Concern for worsening intraabdominal process. Patient's CXR shows signs of aspiration pneumonitis vs pneumonia.   Plan: 1. Repeat CT scan of abdomen 2. Repeat ABG if patient decompensates and start BIPAP  3. I will switch her steroids to IV methylprednisolone due to her risk of aspiration 4. Continue antibiotic therapy. 5. Patient requiring 2 liters to maintain O2 saturation of 93%

## 2019-08-22 NOTE — Progress Notes (Signed)
Follow up on patient. Resting on BIPAP at this time. No distress noted.

## 2019-08-22 NOTE — Progress Notes (Signed)
PT Cancellation Note  Patient Details Name: Catherine Butler MRN: [id number] DOB: 27-Aug-1960   Cancelled Treatment:    Reason Eval/Treat Not Completed: Patient not medically ready. Pt with resting HR at 130, lethargic, restless, confused and RR in upper 30s. PT deferred today as pt not safe for OOB at this time and with limited ability to participate in session. Acute PT to return as able, as appropriate.  [name], PT, DPT Acute Rehabilitation Services Pager #: [phone] Office #: [phone]    Iona Hansen 08/22/2019, 11:47 AM

## 2019-08-22 NOTE — Progress Notes (Addendum)
PHARMACY - TOTAL PARENTERAL NUTRITION CONSULT NOTE   Indication: Severe esophagitis   Patient Measurements: Height: '5\' 6"'$  (167.6 cm) Weight: 134 lb 11.2 oz (61.1 kg) IBW/kg (Calculated) : 59.3 TPN AdjBW (KG): 52.2 Body mass index is 21.74 kg/m. Usual Weight: 61 kg  Assessment: 59 year old female with PMH significant for COPD, HTN, Guillan Barre, quadriplegia, ETOH abuse, and recent admission for colitis now presenting with ascites and colitis on 08/09/2019. Patient has not been eating well since discharge a week prior and feeling that food has been getting stuck in her throat leading to vomiting. Patient has severe muscle depletion and poor po intake due to loss of appetite for >1 month.   Glucose / Insulin: CBG controllled (120-130s). 5 units of sSSI in last 24 hours.  Electrolytes: K 3.8, Mg 2, Phos 3.2. CoCa 9.8. Others within normal limits.  Renal: SCr stable at 0.41 (monitoring closely with quadriplegia), BUN 7 LFTs / TGs: Elevated Alk Phos trending down (187>154). TG elevated at 208. Others within normal limits. Prealbumin / albumin: Pre-albumin 6.4 >>5.3>> less than 5; Alb 1.1 >> less than 1.  Intake / Output; MIVF: UOP 0.9 mL/kg/hr, emesis and dry heaving on 3/1.  1+ edema of lower extremities noted. LBM 3/1. GI Imaging: 08/15/19 CT Abdomen - moderate bilateral pleural effusions, esophagitis and hiatal hernia, large volume ascites Surgeries / Procedures:  08/15/19 - L paracentesis with 900 mL drained 07/31/2019 - EGD: severe esophagitis, 7 cm hiatal hernia, portal hypertensive gastropathy, moderate gastritis, two duodenal polyps which were resected.   Central access: PICC, 08/17/19 TPN start date: 08/17/19  Nutritional Goals (per RD recommendation on 08/17/19): KCal: 1600-1800, Protein: 80-95, Fluid: >= 1.6 L/ day Goal TPN rate is 70 mL/hr (provides 94 g of protein and 1737 kcals per day)  Current Nutrition:  3/1 FLD - 0 intake, requiring BiPAP now due to increased WOB so no PO  intake  Plan:  Continue TPN at goal rate of 70 mL/hr. Electrolytes in TPN: 24mq/L of Na, 669m/L of K, 2m70mL of Ca, 12m82m of Mg, and 18mm64m of Phos. Cl:Ac ratio 1:1 Continue MVI/TE as oral for now. Add Thiamine and folic acid to TPN and discontinue IV pushes.  Continue Sensitive q6h SSI and adjust as needed  Monitor labs  Follow-up ability to take po intake versus Cortrak placement   KCl 10 mEq IV x2  JessiSloan LeiterrmD, BCPS, BCCCP Clinical Pharmacist Clinical phone 08/22/2019 until 3PM- #[phone] [facility] 08/22/2019, 7:21 AM

## 2019-08-22 NOTE — Plan of Care (Signed)

## 2019-08-22 NOTE — Progress Notes (Signed)
OT Cancellation Note  Patient Details Name: Catherine Butler MRN: [id number] DOB: December 10, 1960   Cancelled Treatment:    Reason Eval/Treat Not Completed: Medical issues which prohibited therapy;Fatigue/lethargy limiting ability to participate(Pt's resting HR in 130s with increased RR. Hold OT today.)  OT to continue to follow.  Flora Lipps, OTR/L Acute Rehabilitation Services Pager: [phone] Office: [phone]  Flora Lipps 08/22/2019, 12:43 PM

## 2019-08-22 NOTE — Progress Notes (Signed)
Subjective: HD#7 Events Overnight: Overnight the patient developed several episodes of tachycardia with tachypnea.  The initial episode resolved with initial dose of pain medicine.  EKG showed sinus tachycardia and chest x-ray showed extensive airspace opacities in the right lung that are new from prior with small bilateral pleural effusions that are unchanged from prior.  Patient was started on Lasix 20 mg in the setting of likely pulmonary edema.  Patient developed a subsequent episode of tachycardia later that evening with an ABG showing a respiratory alkalosis of 7.53 likely secondary to hyperventilation.   Patient was seen this morning on rounds. Patient is resting in bed and appears uncomfortable. She is still tachycardic and tachypnic and admits to abdominal pain.   Objective:  Vital signs in last 24 hours: Vitals:   08/22/19 0700 08/22/19 0747 08/22/19 0800 08/22/19 1000  BP: (!) 126/101 123/89 115/86 (!) 113/91  Pulse: (!) 133  (!) 51 (!) 123  Resp: (!) 30 (!) 29 (!) 28 19  Temp: 98.1 F (36.7 C) 98.4 F (36.9 C) 98.4 F (36.9 C) 98.2 F (36.8 C)  TempSrc: Oral Oral Oral Oral  SpO2: 98%  93% 95%  Weight:      Height:        Physical Exam: Physical Exam  Constitutional: She is oriented to person, place, and time. She has a sickly appearance. She appears distressed.  HENT:  Head: Normocephalic and atraumatic.  Eyes: EOM are normal.  Cardiovascular: Intact distal pulses. Tachycardia present. Exam reveals no gallop and no friction rub.  No murmur heard. Pulmonary/Chest: Tachypnea noted. Respiratory distress: in the RUL fields  She has wheezes. She has rales.  Abdominal: Soft. She exhibits no distension. There is abdominal tenderness. There is guarding.  Musculoskeletal:        General: No tenderness or edema. Normal range of motion.  Neurological: She is alert and oriented to person, place, and time.  Skin: Skin is warm and dry. She is not diaphoretic.    Filed  Weights   07/30/2019 2336 08/13/2019 1656  Weight: 52.2 kg 61.1 kg     Intake/Output Summary (Last 24 hours) at 08/22/2019 1305 Last data filed at 08/22/2019 0400 Gross per 24 hour  Intake 2589.46 ml  Output 3500 ml  Net -910.54 ml    Pertinent labs/Imaging: CBC Latest Ref Rng & Units 08/22/2019 08/21/2019 08/21/2019  WBC 4.0 - 10.5 K/uL 7.5 8.2 7.0  Hemoglobin 12.0 - 15.0 g/dL 8.5(L) 9.6(L) 10.0(L)  Hematocrit 36.0 - 46.0 % 25.6(L) 28.9(L) 32.1(L)  Platelets 150 - 400 K/uL 142(L) 162 150    CMP Latest Ref Rng & Units 08/22/2019 08/21/2019 08/21/2019  Glucose 70 - 99 mg/dL 123(H) 139(H) 149(H)  BUN 6 - 20 mg/dL 7 6 6   Creatinine 0.44 - 1.00 mg/dL 0.41(L) 0.47 0.36(L)  Sodium 135 - 145 mmol/L 135 136 136  Potassium 3.5 - 5.1 mmol/L 3.8 4.0 3.5  Chloride 98 - 111 mmol/L 105 106 109  CO2 22 - 32 mmol/L 23 20(L) 21(L)  Calcium 8.9 - 10.3 mg/dL 7.4(L) 7.7(L) 7.0(L)  Total Protein 6.5 - 8.1 g/dL - - -  Total Bilirubin 0.3 - 1.2 mg/dL - - -  Alkaline Phos 38 - 126 U/L - - -  AST 15 - 41 U/L - - -  ALT 0 - 44 U/L - - -    CXR:  Extensive airspace opacities throughout the right lung, new from prior exam 08/17/2019. Findings are nonspecific but may reflect pneumonia. Correlate for  aspiration. Persistent small bilateral pleural effusions. Unchanged cardiomegaly. Hiatal hernia.  CXR: Stable diffuse right lung opacities concerning for pneumonia.   Assessment/Plan:  Principal Problem:   Protein-calorie malnutrition, severe Active Problems:   Urinary retention   Nausea with vomiting   Colitis   Hematochezia   Ascites   Hypoalbuminemia   Enteritis   Generalized abdominal pain   Gastritis and gastroduodenitis   Acute esophagitis   Encephalopathy    Patient Summary: Catherine Butler is a 59 y.o. with pertinent PMH of COPD, hypertension, AIDP/GBS, recent hospitalization for colitis, who presented with nausea and vomiting and admit for enteritis versus chronic pancreatitis on hospital day  7  #Gastroenteritis,unknown etiology #Chronic pancreatitis #Anasarca -Continue bowel rest with TPN -Continue PPI, sucralfate, Phenergan as needed -Continue Dilaudid 0.5 mg q 6 hours for abdominal pain - Replete electrolytes as needed    #Pneumonitis vs aspiration pneumonia: Patient was found to have pulmonary edema on chest x-ray yesterday.  She was given a dose of Lasix 20 mg.  With good urine output, net -910 mL.  Repeat x-ray shows diffuse stable right lung opacities are concerning for pneumonia versus pneumonitis.  Started the patient is high risk for aspiration combined with her consistently elevated tachycardia and tachypnea, I have concern for aspiration pneumonitis versus pneumonia.  Although she does not have a leukocytosis and is afebrile, I will go ahead and treat her for pneumonia. -We will start Unasyn 1.5 g every 6 hours -Supplemental oxygen as needed to keep O2 sats greater than 88%  #COPD exacerbation Patient also has significant wheezing on auscultation of her lungs.  Considering her history of COPD this raises concern for COPD exacerbation in the setting of her worsening tachypnea.  Patient is able to maintain oxygen saturations on room air but is hyperventilating with resultant respiratory alkalosis.  Patient does have gastritis currently on PPI, which is a concern for starting systemic steroid therapy.  I will start scheduled duo nebs, antibiotics, and single dose of prednisone 40 mg. -Prednisone 40 mg -Unasyn 1.5 g every 6 hours -DuoNeb scheduled every 6 hours  Normocytic Anemia: Patient has a slow decline in her hemoglobin thought to be secondary to nutritional deficiencies and chronic disease.  Yesterday her hemoglobin was 6.6.  She was transfused 1 unit of blood with post transfusion CBC showing a hemoglobin of 10.  On admission the patient did have FOBT positive.  We stopped her enoxaparin and place patient on SCDs for DVT prophylaxis.  Patient does not have any signs  or symptoms of GI bleed.  She has no hematochezia or hematemesis.  Diet: TPN, clears if she is not nauseated or vomiting IVF: none VTE: SCDs Code: Full PT/OT recs: home health PT, 3in1 TOC recs: pending   Dispo: Anticipated discharge pending clinical improvement.    Dellia Cloud, D.O. MCIMTP, PGY-1 Date 08/22/2019 Time 1:05 PM

## 2019-08-22 NOTE — Progress Notes (Addendum)
Nutrition Follow up  DOCUMENTATION CODES:   Severe malnutrition in context of chronic illness  INTERVENTION:    TPN to meet 100% of needs  Add Ensure Enlive po TID, each supplement provides 350 kcal and 20 grams of protein  MVI daily   NUTRITION DIAGNOSIS:   Severe Malnutrition related to chronic illness(colitis/esophagitis) as evidenced by mild fat depletion, severe muscle depletion, energy intake < or equal to 75% for > or equal to 1 month.  Ongoing  GOAL:   Patient will meet greater than or equal to 90% of their needs   Met with TPN  MONITOR:   PO intake, Supplement acceptance, Diet advancement, Skin, Weight trends, Labs, I & O's  REASON FOR ASSESSMENT:   Consult Assessment of nutrition requirement/status  ASSESSMENT:   Patient with PMH significant for COPD, HTN, Guillan Barre, quadriplegia, ETOH abuse, and recent admission for colitis. Presents this admission with ascites and colitis.   2/24- L paracentesis- 900 ml drained 2/25- EGD reveals severe esophagitis  2/26- start TPN  Diet advanced to full liquids on 3/1. Meal completions charted as 0% since then. Per RN, pt taking sips of fluids. Continues with nausea/vomiting.   TPN running at 70 ml/hr to provide 94 grams protein and 1737 kcal. Once pt able to tolerate full liquids, may consider transition to enteral nutrition via Cortrak vs TPN. RD to add supplements to maximize kcal and protein (once POs are tolerated).    Admission weight: 52.2 kg  Current weight: 61.1 kg (on 2/25)  I/O: -251 ml since admit  UOP: 3,500 ml x 24 hrs   Medications: dulcolax, SS novolog, MVI with minerals  Labs: CBG 124-153  Diet Order:   Diet Order            Diet full liquid Room service appropriate? Yes; Fluid consistency: Thin  Diet effective now              EDUCATION NEEDS:   Education needs have been addressed  Skin:  Skin Assessment: Reviewed RN Assessment  Last BM:  3/1  Height:   Ht Readings from  Last 1 Encounters:  07/24/2019 '5\' 6"'$  (1.676 m)    Weight:   Wt Readings from Last 1 Encounters:  08/12/2019 61.1 kg    Ideal Body Weight:  51.7 kg(adjusted quadriplegia)  BMI:  Body mass index is 21.74 kg/m.  Estimated Nutritional Needs:   Kcal:  1600-1800 kcal  Protein:  80-95 grams  Fluid:  >/= 1.6 L/day   Mariana Single RD, LDN Clinical Nutrition Pager listed in [place]

## 2019-08-23 ENCOUNTER — Inpatient Hospital Stay (HOSPITAL_COMMUNITY): Payer: Medicaid Other

## 2019-08-23 DIAGNOSIS — D649 Anemia, unspecified: Secondary | ICD-10-CM

## 2019-08-23 DIAGNOSIS — R627 Adult failure to thrive: Secondary | ICD-10-CM

## 2019-08-23 DIAGNOSIS — Z515 Encounter for palliative care: Secondary | ICD-10-CM

## 2019-08-23 DIAGNOSIS — G9340 Encephalopathy, unspecified: Secondary | ICD-10-CM

## 2019-08-23 DIAGNOSIS — J441 Chronic obstructive pulmonary disease with (acute) exacerbation: Secondary | ICD-10-CM

## 2019-08-23 DIAGNOSIS — R0600 Dyspnea, unspecified: Secondary | ICD-10-CM

## 2019-08-23 DIAGNOSIS — K7031 Alcoholic cirrhosis of liver with ascites: Secondary | ICD-10-CM

## 2019-08-23 DIAGNOSIS — K861 Other chronic pancreatitis: Secondary | ICD-10-CM

## 2019-08-23 DIAGNOSIS — T17908A Unspecified foreign body in respiratory tract, part unspecified causing other injury, initial encounter: Secondary | ICD-10-CM

## 2019-08-23 DIAGNOSIS — Z7189 Other specified counseling: Secondary | ICD-10-CM

## 2019-08-23 DIAGNOSIS — J690 Pneumonitis due to inhalation of food and vomit: Secondary | ICD-10-CM

## 2019-08-23 DIAGNOSIS — Z66 Do not resuscitate: Secondary | ICD-10-CM

## 2019-08-23 HISTORY — PX: IR THORACENTESIS ASP PLEURAL SPACE W/IMG GUIDE: IMG5380

## 2019-08-23 LAB — GLUCOSE, CAPILLARY
Glucose-Capillary: 138 mg/dL — ABNORMAL HIGH (ref 70–99)
Glucose-Capillary: 139 mg/dL — ABNORMAL HIGH (ref 70–99)
Glucose-Capillary: 145 mg/dL — ABNORMAL HIGH (ref 70–99)
Glucose-Capillary: 151 mg/dL — ABNORMAL HIGH (ref 70–99)
Glucose-Capillary: 180 mg/dL — ABNORMAL HIGH (ref 70–99)
Glucose-Capillary: 185 mg/dL — ABNORMAL HIGH (ref 70–99)

## 2019-08-23 LAB — PROTIME-INR
INR: 1 (ref 0.8–1.2)
Prothrombin Time: 12.8 seconds (ref 11.4–15.2)

## 2019-08-23 LAB — GRAM STAIN

## 2019-08-23 LAB — COMPREHENSIVE METABOLIC PANEL
ALT: 15 U/L (ref 0–44)
AST: 24 U/L (ref 15–41)
Albumin: <1 g/dL — ABNORMAL LOW (ref 3.5–5.0)
Alkaline Phosphatase: 135 U/L — ABNORMAL HIGH (ref 38–126)
Anion gap: 7 (ref 5–15)
BUN: 10 mg/dL (ref 6–20)
CO2: 24 mmol/L (ref 22–32)
Calcium: 8 mg/dL — ABNORMAL LOW (ref 8.9–10.3)
Chloride: 108 mmol/L (ref 98–111)
Creatinine, Ser: 0.44 mg/dL (ref 0.44–1.00)
GFR calc Af Amer: >60 mL/min (ref >60)
GFR calc non Af Amer: >60 mL/min (ref >60)
Glucose, Bld: 156 mg/dL — ABNORMAL HIGH (ref 70–99)
Potassium: 4.3 mmol/L (ref 3.5–5.1)
Sodium: 139 mmol/L (ref 135–145)
Total Bilirubin: 0.4 mg/dL (ref 0.3–1.2)
Total Protein: 5.2 g/dL — ABNORMAL LOW (ref 6.5–8.1)

## 2019-08-23 LAB — CBC
HCT: 24.6 % — ABNORMAL LOW (ref 36.0–46.0)
Hemoglobin: 7.8 g/dL — ABNORMAL LOW (ref 12.0–15.0)
MCH: 32.6 pg (ref 26.0–34.0)
MCHC: 31.7 g/dL (ref 30.0–36.0)
MCV: 102.9 fL — ABNORMAL HIGH (ref 80.0–100.0)
Platelets: 153 10*3/uL (ref 150–400)
RBC: 2.39 MIL/uL — ABNORMAL LOW (ref 3.87–5.11)
RDW: 19.4 % — ABNORMAL HIGH (ref 11.5–15.5)
WBC: 7.3 10*3/uL (ref 4.0–10.5)
nRBC: 0 % (ref 0.0–0.2)

## 2019-08-23 LAB — PROTEIN, PLEURAL OR PERITONEAL FLUID: Total protein, fluid: <3 g/dL

## 2019-08-23 LAB — LACTATE DEHYDROGENASE, PLEURAL OR PERITONEAL FLUID: LD, Fluid: 73 U/L — ABNORMAL HIGH (ref 3–23)

## 2019-08-23 LAB — LACTATE DEHYDROGENASE: LDH: 230 U/L — ABNORMAL HIGH (ref 98–192)

## 2019-08-23 LAB — GLUCOSE, PLEURAL OR PERITONEAL FLUID: Glucose, Fluid: 151 mg/dL

## 2019-08-23 LAB — MAGNESIUM: Magnesium: 1.9 mg/dL (ref 1.7–2.4)

## 2019-08-23 LAB — PHOSPHORUS: Phosphorus: 3.8 mg/dL (ref 2.5–4.6)

## 2019-08-23 MED ORDER — TRAVASOL 10 % IV SOLN
INTRAVENOUS | Status: AC
  Filled 2019-08-23: qty 940.8

## 2019-08-23 MED ORDER — LEVALBUTEROL HCL 0.63 MG/3ML IN NEBU
0.6300 mg | INHALATION_SOLUTION | Freq: Four times a day (QID) | RESPIRATORY_TRACT | Status: DC
  Administered 2019-08-23 – 2019-08-24 (×4): 0.63 mg via RESPIRATORY_TRACT
  Filled 2019-08-23 (×4): qty 3

## 2019-08-23 MED ORDER — METHYLPREDNISOLONE SODIUM SUCC 40 MG IJ SOLR
40.0000 mg | Freq: Every day | INTRAMUSCULAR | Status: AC
  Administered 2019-08-23 – 2019-08-26 (×4): 40 mg via INTRAVENOUS
  Filled 2019-08-23 (×4): qty 1

## 2019-08-23 MED ORDER — FUROSEMIDE 10 MG/ML IJ SOLN
20.0000 mg | Freq: Once | INTRAMUSCULAR | Status: AC
  Administered 2019-08-23: 20 mg via INTRAVENOUS
  Filled 2019-08-23: qty 2

## 2019-08-23 MED ORDER — LIDOCAINE HCL (PF) 1 % IJ SOLN
INTRAMUSCULAR | Status: DC | PRN
  Administered 2019-08-23: 10 mL

## 2019-08-23 MED ORDER — LIDOCAINE HCL 1 % IJ SOLN
INTRAMUSCULAR | Status: AC
  Filled 2019-08-23: qty 20

## 2019-08-23 MED ORDER — WHITE PETROLATUM EX OINT
TOPICAL_OINTMENT | CUTANEOUS | Status: AC
  Administered 2019-08-23: 0.2
  Filled 2019-08-23: qty 28.35

## 2019-08-23 NOTE — Progress Notes (Signed)
OT Cancellation Note  Patient Details Name: Catherine Butler MRN: [id number] DOB: 03-02-1961   Cancelled Treatment:    Reason Eval/Treat Not Completed: Medical issues which prohibited therapy;Fatigue/lethargy limiting ability to participate. Pt tachycardic, SOB and RN reports plan for thoracentesis today. Requested OT to hold until after procedure.  Will follow and see as able.   Barry Brunner, OT Acute Rehabilitation Services Pager [phone] Office [phone]    Chancy Milroy 08/23/2019, 9:38 AM

## 2019-08-23 NOTE — Progress Notes (Signed)
Subjective: HD#8 Events Overnight: No overnight events  Patient was seen this morning on rounds.  Patient is laying in bed and appears uncomfortable.  She continues to have difficult time breathing and continues to have abdominal pain.  She has difficult time speaking as she is very short of breath.  I spoke to her about goals of care and healthcare power of attorney.  Patient states that her son could make decisions for her if if she is not able to do so.  I will reach out to today to inform him of her mother's progress, as he likely does not know how ill she is.  Catherine Butler agreed to [place] reaching out to him.  Objective:  Vital signs in last 24 hours: Vitals:   08/22/19 1530 08/22/19 1951 08/22/19 2344 08/23/19 0358  BP: (!) 128/98 (!) 146/90 (!) 139/104 (!) 129/101  Pulse: (!) 124 (!) 133 (!) 116 (!) 126  Resp: (!) 34 14 (!) 22 (!) 31  Temp: 98.6 F (37 C) 98.4 F (36.9 C) 97.9 F (36.6 C) 97.9 F (36.6 C)  TempSrc: Oral Oral Oral Oral  SpO2: 92% 93% 96% 94%  Weight:      Height:        Physical Exam: Physical Exam  Constitutional: She appears cachectic. She appears distressed.  HENT:  Head: Normocephalic and atraumatic.  Eyes: EOM are normal.  Cardiovascular: Intact distal pulses. Tachycardia present.  Pulmonary/Chest: Tachypnea noted. She is in respiratory distress. She has wheezes (diffuse). She has rales (right sided). She exhibits no tenderness.  Abdominal: Bowel sounds are normal. There is abdominal tenderness. There is guarding.  Musculoskeletal:        General: Edema present. No tenderness. Normal range of motion.     Cervical back: Normal range of motion.  Neurological: She is alert.  Skin: Skin is warm and dry. She is not diaphoretic.    Filed Weights   08/13/2019 2336 09/15/2019 1656  Weight: 52.2 kg 61.1 kg     Intake/Output Summary (Last 24 hours) at 08/23/2019 0636 Last data filed at 08/23/2019 0401 Gross per 24 hour  Intake --  Output 925 ml  Net -925 ml     Pertinent labs/Imaging: CBC Latest Ref Rng & Units 08/22/2019 08/21/2019 08/21/2019  WBC 4.0 - 10.5 K/uL 7.5 8.2 7.0  Hemoglobin 12.0 - 15.0 g/dL 1.6(X) 0.9(U) 10.0(L)  Hematocrit 36.0 - 46.0 % 25.6(L) 28.9(L) 32.1(L)  Platelets 150 - 400 K/uL 142(L) 162 150    CMP Latest Ref Rng & Units 08/22/2019 08/21/2019 08/21/2019  Glucose 70 - 99 mg/dL 045(W) 098(J) 191(Y)  BUN 6 - 20 mg/dL 7 6 6   Creatinine 0.44 - 1.00 mg/dL ) 7.82(N 5.62)  Sodium 135 - 145 mmol/L 135 136 136  Potassium 3.5 - 5.1 mmol/L 3.8 4.0 3.5  Chloride 98 - 111 mmol/L 105 106 109  CO2 22 - 32 mmol/L 23 20(L) 21(L)  Calcium 8.9 - 10.3 mg/dL 7.4(L) 7.7(L) 7.0(L)  Total Protein 6.5 - 8.1 g/dL - - -  Total Bilirubin 0.3 - 1.2 mg/dL - - -  Alkaline Phos 38 - 126 U/L - - -  AST 15 - 41 U/L - - -  ALT 0 - 44 U/L - - -   CT abdomen: 1. Increased bilateral pleural effusions and anasarca. 2. New patchy areas of airspace opacity in the right middle and right lower lobes concerning for pneumonia. Clinical correlation is recommended. 3. Advanced fatty infiltration of the liver with findings of  early cirrhosis. 4. Thickened appearance of loops of small bowel in the left hemiabdomen may be related to ascites and underlying liver disease. Enteritis is not excluded. Clinical correlation is recommended. No bowel obstruction. 5. Aortic Atherosclerosis    Assessment/Plan:  Principal Problem:   Protein-calorie malnutrition, severe Active Problems:   Urinary retention   Nausea with vomiting   Colitis   Hematochezia   Ascites   Hypoalbuminemia   Enteritis   Generalized abdominal pain   Gastritis and gastroduodenitis   Acute esophagitis   Encephalopathy    Patient Summary: Catherine Butler is a 59 y.o. with pertinent PMH of COPD, HTN, AIDP/GBS, presented with nausea and vomiting and admit for enteritis vs chronic pancreatitis on hospital day 8  #Pneumonitis vs aspiration pneumonia: Patient continues to have worsening respiratory  status.  She has been tachypneic now for 3 days, which concerns me that she will decompensate due to exhaustion.  Patient does have significant wheezing in the upper lung fields and would likely benefit from COPD therapy but she is significantly tachycardic in the 130s regularly.  Patient also will desaturate off of 4 L of oxygen down to the high 80s. Therefore, we will schedule Xopenex instead of albuterol for breathing treatments.  We also continue IV antibiotics.  Based on the recent CT abdomen patient does have significant pleural effusions bilaterally.  These pleural effusions have increased in size since her admission.  We will consult IR today for bilateral thoracentesis in order to improve her acute respiratory status.  Given the patient's comorbidities and poor response to medical therapy, I am concerned the patient will have a difficult clinical course moving forward.  Therefore, I will consult palliative care to initiate goals of care conversations. -Continue IV antibiotics as on day 2 of Unasyn every 6 hours. -Continue nasal cannula supplemental oxygen as needed. -Appreciate IR's assistance with bilateral thoracentesis.  #COPD exacerbation Patient continues to have significant new wheezing/ -Scheduled Xopenex nebulizer - continue methylprednisolone 50 mg   #Gastroenteritis,unknown etiology #Chronic pancreatitis #Anasarca Patient should continue to BM but is not tolerating p.o. intake still.  She continues to have significant abdominal pain recent MRI continues to show significant dilations of bowel loops with anasarca.  Patient does have significant fatty infiltration of the liver with early cirrhosis.  Diuretic therapy is limited by her low blood pressures. -Continue TPN -Appreciate GI assistance. -Advance diet slowly as patient can tolerate -Continue sucralfate, Phenergan and PPI  #Anemia: Has a roughly 2 g drop in her hemoglobin since her transfusion 2 days ago.  Patient did have a  positive FOBT on admission.  She is on SCDs for DVT prophylaxis.  Considering her history of ETO H use disorder and significant esophagitis/gastritis, there is concern for a GI bleed.  Currently patient does have a macrocytic anemia with concern for possible nutritional deficiencies contributing.  Although she does have elevated folate and B12 levels.  Patient has not had a recent colonoscopy and may need one if her hemoglobin continues to decline. -Appreciate GIs assistance   Diet: TPN IVF: None VTE: SCDs Code: Full code PT/OT recs: Pending TOC recs: Pending   Dispo: Anticipated discharge pending.    Marianna Payment, D.O. [facility]IM[facility], PGY-1 Date 08/23/2019 Time 6:36 AM

## 2019-08-23 NOTE — Significant Event (Signed)
I was able to reach out to [name] son Catherine Butler and he states that he has been keeping up with her mother's MyChart and is well up-to-date with her treatment plan and also her active medical diagnosis.  He is well aware that in view of her current respiratory compromise, severe protein calorie malnutrition, worsening macrocytic anemia, her prognosis is fragile.  I was able to provide comfort and let him know that we are still treating full scope of care now and hoping for the best.  He states that he will come by the hospital today to visit his mom.  We are currently pending palliative care assistance to initiate goals of care discussion.  We have personally tried discussing [name] condition with her in depth however she still wishes to remain full code.  We greatly appreciate palliative care assistance.  Jodelle Red, MD [phone] Internal Medicine Teaching Service

## 2019-08-23 NOTE — Progress Notes (Signed)
SLP Cancellation Note  Patient Details Name: Catherine Butler MRN: [id number] DOB: 06/14/1961   Cancelled treatment:       Reason Eval/Treat Not Completed: Medical issues which prohibited therapy(Pt is known to speech pathology and participated in a modified barium swallow study on 08/10/19 which was negative for oropharyngeal dysphagia but revealed esophageal retention and assessment of the esophageal phase was recommended.   Case was discussed with RN and she indicated that the pt is currently having difficulty breathing. It was therefore agreed that the repeat bedside swallow evaluation be deferred at this time. SLP will follow up on subsequent date unless contacted by RN sooner due to improved respiratory function.)  [name] I. [name], MS, CCC-SLP Acute Rehabilitation Services Office number [phone] Pager [phone]  [name] 08/23/2019, 10:02 AM

## 2019-08-23 NOTE — Consult Note (Signed)
Consultation Note Date: 08/23/2019   Patient Name: Catherine Butler  DOB: May 03, 1961  MRN: [id number]  Age / Sex: 59 y.o., female  PCP: Kerin Perna, NP Referring Physician: Velna Ochs, MD  Reason for Consultation: Establishing goals of care  HPI/Patient Profile: 60 y.o. female  with past medical history of COPD, guillain-barre/AIDP w/ chronic weakness, HTN, and ETOH use admitted on 07/23/2019 with abdominal pain, nausea, and vomiting. She was found to be severely dehydrated with multiple severe electrolyte abnormalities. She had a very low albumin on admission which has further declined and is now undetectable. She now has bilateral pleural effusions and worsening respiratory status. Being treated for pneumonitis vs aspiration pna and COPD exacerbation. Patient is not tolerating PO intake and has been started of TPN. MRI shows dilation of bowel loop. She has severe protein calorie malnutrition causing profound hypoalbuminemia and anasarca. Hgb continues to drop despite transfusion - she has ETOH use with significant esophagitis/gastritis. Patient has not improved since admission and now has worsening respiratory status. PMT consulted to assist with Catherine Butler.   Clinical Assessment and Goals of Care: I have reviewed medical records including EPIC notes, labs and imaging, received report from Dr. Marianna Butler and RN, assessed the patient and then met with patient  to discuss diagnosis prognosis, GOC, EOL wishes, disposition and options.  I attempted to speak to Catherine Butler a couple of times today - the first time I went to see her she was having a thoracentesis and the second time I went to see her she was very drowsy - I tried multiple times to stimulate her - she would open her eyes and look at me and then quickly fall back to sleep - no verbal interaction. Obviously unable to discuss her goals of care or make medical decisions for herself.  I called son, Catherine Butler, to discuss goals of care with him. Per Dr. Marianna Payment, Catherine Butler gave permission earlier to speak with Catherine Butler and she would want him to be her medical decision maker if she were unable.   I introduced Palliative Medicine as specialized medical care for people living with serious illness. It focuses on providing relief from the symptoms and stress of a serious illness. The goal is to improve quality of life for both the patient and the family.  Catherine Butler tells me his mother has been "going downhill fast". He further describes declines in her mental status.    We discussed her current illness and what it means in the larger context of her on-going co-morbidities.  Natural disease trajectory and expectations at EOL were discussed. Discussed respiratory distress, hypoalbuminemia, severe malnutrition, anasarca, functional and cognitive decline.  Catherine Butler tells me he understands how ill his mother is and also understands she is declining rather rapidly.  During our conversation he was called by Dr. Marianna Butler and received an update from him confirming the above.  We discuss that it looks like Catherine Butler may be close to requiring a ventilator for respiratory support. We discussed moving forward with this option vs transition to focus on comfort. Catherine Butler tells me "I just do not want her to suffer". He indicates he does not want her to be intubated but would like to speak with other family members before confirming that decision.  I gave him my number to call me back after speaking with other family. Catherine Butler is also on his way to the hospital.  Catherine Butler called me back later and confirmed that he and his family members all agree  that Catherine Butler would not want to be intubated given her severe medical condition. He agrees to changing code status to DNR/DNI. We discuss continuing current care with limit of DNR. If Catherine Butler were to further decline or appear to be suffering he would want [place] to transition to full comfort care.   Questions  and concerns were addressed. The family was encouraged to call with questions or concerns.   Discussed above with April, RN and Dr. Marianna Butler  Primary Decision Maker NEXT OF KIN - son - Catherine Belfast [name] is unable to make her medical decisions at this time d/t severe lethargy, AMS, and respiratory distress   SUMMARY OF RECOMMENDATIONS   - code status changed to DNR/DNI - continue current care: if Catherine Butler further declines or appears to be suffering transition to full comfort care   Code Status/Advance Care Planning:  DNR   Symptom Management:   Per primary team at this time - has PRN dilaudid  Prognosis:   Poor prognosis - at high risk for acute deterioration and hospital death  Discharge Planning: To Be Determined      Primary Diagnoses: Present on Admission: . Colitis . Hematochezia . Ascites . Urinary retention . Nausea with vomiting . Hypoalbuminemia . Enteritis . Generalized abdominal pain . Gastritis and gastroduodenitis . Acute esophagitis . Protein-calorie malnutrition, severe   I have reviewed the medical record, interviewed the patient and family, and examined the patient. The following aspects are pertinent.  Past Medical History:  Diagnosis Date  . Anxiety   . Arthritis   . Asthma   . Complication of anesthesia    nausea  . COPD (chronic obstructive pulmonary disease) ([place])   . Depression   . Guillain-Barre ([place]) 2018  . H/O alcohol abuse   . Hypertension    pt denies  . Nausea & vomiting 07/2019  . PONV (postoperative nausea and vomiting)   . Reflux   . Suicide attempt by multiple drug overdose ([place]) 2014   Social History   Socioeconomic History  . Marital status: Single    Spouse name: Not on file  . Number of children: 1  . Years of education: Not on file  . Highest education level: High school graduate  Occupational History  . Occupation: unemployed    Comment: prior Veterinary surgeon  Tobacco Use  . Smoking  status: Current Every Day Smoker    Packs/day: 1.00    Years: 40.00    Pack years: 40.00    Types: Cigarettes  . Smokeless tobacco: Never Used  Substance and Sexual Activity  . Alcohol use: Yes    Comment: 1-2 glasses a month  . Drug use: No  . Sexual activity: Not Currently  Other Topics Concern  . Not on file  Social History Narrative   Lives with mom   Caffeine- tea during the day       Social Determinants of Health   Financial Resource Strain:   . Difficulty of Paying Living Expenses: Not on file  Food Insecurity:   . Worried About Charity fundraiser in the Last Year: Not on file  . Ran Out of Food in the Last Year: Not on file  Transportation Needs:   . Lack of Transportation (Medical): Not on file  . Lack of Transportation (Non-Medical): Not on file  Physical Activity:   . Days of Exercise per Week: Not on file  . Minutes of Exercise per Session: Not on file  Stress:   .  Feeling of Stress : Not on file  Social Connections:   . Frequency of Communication with Friends and Family: Not on file  . Frequency of Social Gatherings with Friends and Family: Not on file  . Attends Religious Services: Not on file  . Active Member of Clubs or Organizations: Not on file  . Attends Archivist Meetings: Not on file  . Marital Status: Not on file   Family History  Problem Relation Age of Onset  . Diabetes Father   . Hypertension Father 72       heart attack  . Kidney disease Father   . Heart attack Father   . Heart failure Father   . Dementia Father   . Breast cancer Mother 30       lump removed, bile duct cancer  . COPD Mother   . Hypertension Brother   . Allergic rhinitis Paternal Grandfather    Scheduled Meds: . aspirin EC  81 mg Oral Daily  . bisacodyl  10 mg Rectal Daily  . Chlorhexidine Gluconate Cloth  6 each Topical Daily  . feeding supplement (ENSURE ENLIVE)  237 mL Oral TID BM  . insulin aspart  0-9 Units Subcutaneous Q6H  . levalbuterol  0.63  mg Nebulization Q6H  . lidocaine      . mouth rinse  15 mL Mouth Rinse BID  . methylPREDNISolone (SOLU-MEDROL) injection  40 mg Intravenous Daily  . multivitamin with minerals  1 tablet Oral Daily  . pantoprazole (PROTONIX) IV  40 mg Intravenous Q12H  . sodium chloride flush  10-40 mL Intracatheter Q12H  . sucralfate  1 g Oral TID WC & HS   Continuous Infusions: . ampicillin-sulbactam (UNASYN) IV 1.5 g (08/23/19 1013)  . TPN ADULT (ION) 70 mL/hr at 08/23/19 1200  . TPN ADULT (ION)     PRN Meds:.anti-nausea, HYDROmorphone (DILAUDID) injection, lidocaine (PF), lidocaine (PF), promethazine, sodium chloride flush Allergies  Allergen Reactions  . 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 Lipitor [Atorvastatin] Other (See Comments)    "really bad leg pain"   Review of Systems  Unable to perform ROS: Mental status change    Physical Exam Constitutional:      Appearance: She is ill-appearing.  HENT:     Head: Normocephalic and atraumatic.  Cardiovascular:     Rate and Rhythm: Tachycardia present.  Pulmonary:     Comments: Accessory muscle use, on nasal cannula Musculoskeletal:     Right lower leg: Edema present.     Left lower leg: Edema present.     Comments: BUE edema  Skin:    General: Skin is warm and dry.  Neurological:     Motor: Weakness present.     Comments: Does not answer questions, opens eyes and quicks goes back to sleep     Vital Signs: BP (!) 133/112 (BP Location: Right Wrist)   Pulse (!) 129   Temp 97.8 F (36.6 C) (Oral)   Resp (!) 25   Ht '5\' 6"'$  (1.676 m)   Wt 61.1 kg   LMP 02/22/2006 Comment: tubal ligation  SpO2 98%   BMI 21.74 kg/m  Pain Scale: 0-10 POSS *See Group Information*: S-Acceptable,Sleep, easy to arouse Pain Score: Asleep   SpO2: SpO2: 98 % O2 Device:SpO2: 98 % O2 Flow Rate: .O2 Flow Rate (L/min): 3 L/min  IO: Intake/output summary:   Intake/Output Summary (Last 24 hours) at 08/23/2019 1330 Last  data filed at 08/23/2019 1200 Gross  per 24 hour  Intake 635 ml  Output 1925 ml  Net -1290 ml    LBM: Last BM Date: 08/20/19 Baseline Weight: Weight: 52.2 kg Most recent weight: Weight: 61.1 kg     Palliative Assessment/Data: PPS 20% d/t PO intake    Time Total: 70 minutes Greater than 50%  of this time was spent counseling and coordinating care related to the above assessment and plan.  Juel Burrow, DNP, AGNP-C Palliative Medicine Team [phone] Pager: [phone]

## 2019-08-23 NOTE — Progress Notes (Signed)
Physical Therapy Treatment Patient Details Name: Catherine Butler MRN: [id number] DOB: [birth year] Today's Date: 08/23/2019    History of Present Illness 59 year old female with past medical history significant for COPD, hypertension, AIDP, recent hospital admission for colitis (2/14-2/19) who presented on 08/18/2019 with abdominal pain, nausea, and vomiting.    PT Comments    Pt confused and hallucinating during session, refusing functional mobility at this time but a willing participant in bed level exercise. Pt tachycardic at rest but with minimal rise in HR during exercise. Pt is currently unsafe to discharge home due to confusion, weakness, and impaired mobility, all placing her at a high falls risk. PT updating recommendations to SNF at this time.  Follow Up Recommendations  SNF;Supervision/Assistance - 24 hour     Equipment Recommendations  (defer to post-acute setting)    Recommendations for Other Services       Precautions / Restrictions Precautions Precautions: Fall Precaution Comments: HR Restrictions Weight Bearing Restrictions: No    Mobility  Bed Mobility Overal bed mobility: (pt defers functional mobility)                Transfers                    Ambulation/Gait                 Stairs             Wheelchair Mobility    Modified Rankin (Stroke Patients Only)       Balance                                            Cognition Arousal/Alertness: Awake/alert Behavior During Therapy: Restless Overall Cognitive Status: Impaired/Different from baseline Area of Impairment: Orientation;Attention;Memory;Safety/judgement;Following commands;Awareness;Problem solving                 Orientation Level: Disoriented to;Situation Current Attention Level: Alternating Memory: Decreased recall of precautions;Decreased short-term memory Following Commands: Follows one step commands consistently;Follows one step  commands with increased time Safety/Judgement: Decreased awareness of safety;Decreased awareness of deficits   Problem Solving: Slow processing;Requires verbal cues General Comments: pt hallucinatign during session      Exercises General Exercises - Lower Extremity Ankle Circles/Pumps: AROM;Both;20 reps Gluteal Sets: AROM;Both;10 reps Heel Slides: AROM;Both;10 reps Hip ABduction/ADduction: AROM;Both;10 reps Straight Leg Raises: AROM;Both;10 reps Other Exercises Other Exercises: Bridges x 10    General Comments General comments (skin integrity, edema, etc.): tachy to 128 at rest, HR only increasing to 131 with therapeutic exercise, pt declines functional mobility. Pt on 3L [place]      Pertinent Vitals/Pain Pain Assessment: No/denies pain    Home Living                      Prior Function            PT Goals (current goals can now be found in the care plan section) Acute Rehab PT Goals Patient Stated Goal: To improve mobility and reduce pain Progress towards PT goals: Not progressing toward goals - comment(limited by tachycardia and refusing mobility)    Frequency    Min 2X/week      PT Plan Discharge plan needs to be updated    Co-evaluation              AM-PAC PT "6 Clicks" Mobility  Outcome Measure  Help needed turning from your back to your side while in a flat bed without using bedrails?: A Lot Help needed moving from lying on your back to sitting on the side of a flat bed without using bedrails?: A Lot Help needed moving to and from a bed to a chair (including a wheelchair)?: A Lot Help needed standing up from a chair using your arms (e.g., wheelchair or bedside chair)?: A Lot Help needed to walk in hospital room?: A Lot Help needed climbing 3-5 steps with a railing? : A Lot 6 Click Score: 12    End of Session Equipment Utilized During Treatment: Oxygen Activity Tolerance: Patient limited by fatigue Patient left: in bed;with call bell/phone  within reach;with bed alarm set Nurse Communication: Mobility status PT Visit Diagnosis: Muscle weakness (generalized) (M62.81);Pain     Time: 1549-1600 PT Time Calculation (min) (ACUTE ONLY): 11 min  Charges:  $Therapeutic Exercise: 8-22 mins                     Zenaida Niece, PT, DPT Acute Rehabilitation Pager: [phone]    Zenaida Niece 08/23/2019, 4:58 PM

## 2019-08-23 NOTE — Progress Notes (Addendum)
PHARMACY - TOTAL PARENTERAL NUTRITION CONSULT NOTE   Indication: Severe esophagitis   Patient Measurements: Height: 5' 6" (167.6 cm) Weight: 134 lb 11.2 oz (61.1 kg) IBW/kg (Calculated) : 59.3 TPN AdjBW (KG): 52.2 Body mass index is 21.74 kg/m. Usual Weight: 61 kg  Assessment: 59 year old female with PMH significant for COPD, HTN, Guillan Barre, quadriplegia, ETOH abuse, and recent admission for colitis now presenting with ascites and colitis on 08/18/2019. Patient has not been eating well since discharge a week prior and feeling that food has been getting stuck in her throat leading to vomiting. Patient has severe muscle depletion and poor po intake due to loss of appetite for >1 month.   Glucose / Insulin: CBG controlled (139-180). 5 units of sSSI in last 24 hours.  Electrolytes: K 4.3, Mg 1.9, Phos 3.8. CoCa 10.4- will reduce in TPN and monitor. Others within normal limits.  Renal: SCr stable at 0.44 (monitoring closely with quadriplegia), BUN 10 (trend up) LFTs / TGs: Elevated Alk Phos trending down (135). TG elevated at 208. Others within normal limits. Prealbumin / albumin: Pre-albumin 6.4 >>5.3>> less than 5; Alb 1.1 >> less than 1.  Intake / Output; MIVF: UOP 0.6 mL/kg/hr, emesis and dry heaving on 3/1.  1+ edema of lower extremities noted. LBM 3/1. Anasarca noted on CT scan.  GI Imaging: 08/15/19 CT Abdomen - moderate bilateral pleural effusions, esophagitis and hiatal hernia, large volume ascites 08/22/19 CT Abdomen - PNA + increased bilateral effusions and anasarca, advanced fatty infiltration of liver suggestive of early cirrhosis, thickened small bowel loops from ascites and underlying liver disease- cannot exclude enteritis, no bowel obstruction Surgeries / Procedures:  08/15/19 - L paracentesis with 900 mL drained 08/11/2019 - EGD: severe esophagitis, 7 cm hiatal hernia, portal hypertensive gastropathy, moderate gastritis, two duodenal polyps which were resected.   Central access:  PICC, 08/17/19 TPN start date: 08/17/19  Nutritional Goals (per RD recommendation on 08/22/19): KCal: 1600-1800, Protein: 80-95, Fluid: >= 1.6 L/ day Goal TPN rate is 70 mL/hr (provides 94 g of protein and 1737 kcals per day)  Current Nutrition:  FLD - poor intake, increased WOB and abdominal pain  Ensure Enlive TID - none  Plan:  Continue TPN at goal rate of 70 mL/hr. Electrolytes in TPN: 50mEq/L of Na, 65mEq/L of K, reduce to 2.5mEq/L of Ca, 15mEq/L of Mg, and 15mmol/L of Phos. Cl:Ac ratio 1:1 Continue MVI/TE as oral for now. Add Thiamine and folic acid to TPN and discontinue IV pushes.  Continue Sensitive q6h SSI and adjust as needed  Monitor labs  Follow-up ability to take po intake versus Cortrak placement   Monitor volume status - may need to concentrate TPN    , PharmD, BCPS, BCCCP Clinical Pharmacist Clinical phone 08/23/2019 until 3PM- #[id number] Please refer to [facility] for [facility] numbers 08/23/2019, 7:31 AM  

## 2019-08-23 NOTE — Procedures (Signed)
Ultrasound-guided diagnostic and therapeutic right thoracentesis performed yielding 450 cc of light yellow fluid. No immediate complications. Follow-up chest x-ray pending. The fluid was sent to the lab for preordered studies. EBL < 1 cc.

## 2019-08-23 NOTE — Progress Notes (Signed)
RT NOTE:  NIF -30 VC  

## 2019-08-23 NOTE — Plan of Care (Signed)
  Problem: Health Behavior/Discharge Planning: Goal: Ability to manage health-related needs will improve Outcome: Progressing   Problem: Clinical Measurements: Goal: Ability to maintain clinical measurements within normal limits will improve Outcome: Progressing Goal: Will remain free from infection Outcome: Progressing Goal: Diagnostic test results will improve Outcome: Progressing   

## 2019-08-24 DIAGNOSIS — E46 Unspecified protein-calorie malnutrition: Secondary | ICD-10-CM

## 2019-08-24 DIAGNOSIS — K7030 Alcoholic cirrhosis of liver without ascites: Secondary | ICD-10-CM

## 2019-08-24 LAB — COMPREHENSIVE METABOLIC PANEL
ALT: 21 U/L (ref 0–44)
AST: 25 U/L (ref 15–41)
Albumin: 1 g/dL — ABNORMAL LOW (ref 3.5–5.0)
Alkaline Phosphatase: 152 U/L — ABNORMAL HIGH (ref 38–126)
Anion gap: 10 (ref 5–15)
BUN: 15 mg/dL (ref 6–20)
CO2: 25 mmol/L (ref 22–32)
Calcium: 8.2 mg/dL — ABNORMAL LOW (ref 8.9–10.3)
Chloride: 106 mmol/L (ref 98–111)
Creatinine, Ser: 0.44 mg/dL (ref 0.44–1.00)
GFR calc Af Amer: >60 mL/min (ref >60)
GFR calc non Af Amer: >60 mL/min (ref >60)
Glucose, Bld: 126 mg/dL — ABNORMAL HIGH (ref 70–99)
Potassium: 4.3 mmol/L (ref 3.5–5.1)
Sodium: 141 mmol/L (ref 135–145)
Total Bilirubin: 0.5 mg/dL (ref 0.3–1.2)
Total Protein: 5.4 g/dL — ABNORMAL LOW (ref 6.5–8.1)

## 2019-08-24 LAB — CBC
HCT: 25.6 % — ABNORMAL LOW (ref 36.0–46.0)
Hemoglobin: 7.9 g/dL — ABNORMAL LOW (ref 12.0–15.0)
MCH: 32.8 pg (ref 26.0–34.0)
MCHC: 30.9 g/dL (ref 30.0–36.0)
MCV: 106.2 fL — ABNORMAL HIGH (ref 80.0–100.0)
Platelets: 183 10*3/uL (ref 150–400)
RBC: 2.41 MIL/uL — ABNORMAL LOW (ref 3.87–5.11)
RDW: 18.6 % — ABNORMAL HIGH (ref 11.5–15.5)
WBC: 9.6 10*3/uL (ref 4.0–10.5)
nRBC: 0 % (ref 0.0–0.2)

## 2019-08-24 LAB — GLUCOSE, CAPILLARY
Glucose-Capillary: 112 mg/dL — ABNORMAL HIGH (ref 70–99)
Glucose-Capillary: 148 mg/dL — ABNORMAL HIGH (ref 70–99)
Glucose-Capillary: 184 mg/dL — ABNORMAL HIGH (ref 70–99)
Glucose-Capillary: 151 mg/dL — ABNORMAL HIGH (ref 70–99)

## 2019-08-24 LAB — PH, BODY FLUID: pH, Body Fluid: 7.7

## 2019-08-24 LAB — MAGNESIUM: Magnesium: 1.8 mg/dL (ref 1.7–2.4)

## 2019-08-24 LAB — AMMONIA: Ammonia: 38 umol/L — ABNORMAL HIGH (ref 9–35)

## 2019-08-24 LAB — TSH: TSH: 2.258 u[IU]/mL (ref 0.350–4.500)

## 2019-08-24 LAB — PHOSPHORUS: Phosphorus: 4.1 mg/dL (ref 2.5–4.6)

## 2019-08-24 LAB — SAVE SMEAR(SSMR), FOR PROVIDER SLIDE REVIEW

## 2019-08-24 LAB — VITAMIN B12: Vitamin B-12: 719 pg/mL (ref 180–914)

## 2019-08-24 MED ORDER — THIAMINE HCL 100 MG/ML IJ SOLN
500.0000 mg | Freq: Three times a day (TID) | INTRAVENOUS | Status: DC
  Administered 2019-08-24 (×2): 500 mg via INTRAVENOUS
  Filled 2019-08-24 (×3): qty 5

## 2019-08-24 MED ORDER — LEVALBUTEROL HCL 0.63 MG/3ML IN NEBU
0.6300 mg | INHALATION_SOLUTION | Freq: Two times a day (BID) | RESPIRATORY_TRACT | Status: DC
  Administered 2019-08-25 – 2019-08-28 (×6): 0.63 mg via RESPIRATORY_TRACT
  Filled 2019-08-24 (×11): qty 3

## 2019-08-24 MED ORDER — TRAVASOL 10 % IV SOLN
INTRAVENOUS | Status: AC
  Filled 2019-08-24: qty 940.8

## 2019-08-24 NOTE — Plan of Care (Signed)

## 2019-08-24 NOTE — Progress Notes (Signed)
Subjective: HD#9 Events Overnight: Patient became encephalopathic over last 24 hours. PT notes confusion and hallucination , which is consistent with Palliatives Cares assessment.   Patient was seen this morning on rounds.  Patient was resting in bed and appeared uncomfortable.  She continues to be tachypneic and tachycardic with significant abdominal pain.  Her symptoms make it difficult for her to communicate and she has periods of intermittent confusion.  Objective:  Vital signs in last 24 hours: Vitals:   08/23/19 2049 08/23/19 2333 08/24/19 0135 08/24/19 0313  BP: (!) 119/101 (!) 128/99  (!) 135/102  Pulse: (!) 127 (!) 120    Resp: (!) 26 20  20   Temp:  98 F (36.7 C)  98 F (36.7 C)  TempSrc:  Oral  Oral  SpO2:  95% 95% 97%  Weight:      Height:        Physical Exam: Physical Exam  Constitutional: She appears cachectic. She has a sickly appearance. She appears distressed.  HENT:  Head: Normocephalic and atraumatic.  Eyes: EOM are normal.  Cardiovascular: Intact distal pulses. Tachycardia present.  Pulmonary/Chest: Accessory muscle usage present. Tachypnea noted. She is in respiratory distress. She has wheezes. She has rales.  Abdominal: There is abdominal tenderness.  Musculoskeletal:        General: No tenderness or edema.  Neurological: She is alert. She displays weakness.  Skin: Skin is warm and dry.    Filed Weights   08/03/2019 2336 08/16/19 1656  Weight: 52.2 kg 61.1 kg     Intake/Output Summary (Last 24 hours) at 08/24/2019 0552 Last data filed at 08/23/2019 2334 Gross per 24 hour  Intake 1051.15 ml  Output 1475 ml  Net -423.85 ml    Pertinent labs/Imaging: CBC Latest Ref Rng & Units 08/23/2019 08/22/2019 08/21/2019  WBC 4.0 - 10.5 K/uL 7.3 7.5 8.2  Hemoglobin 12.0 - 15.0 g/dL 7.8(L) 8.5(L) 9.6(L)  Hematocrit 36.0 - 46.0 % 24.6(L) 25.6(L) 28.9(L)  Platelets 150 - 400 K/uL 153 142(L) 162    CMP Latest Ref Rng & Units 08/23/2019 08/22/2019 08/21/2019  Glucose  70 - 99 mg/dL 10/21/2019) 161(W) 960(A)  BUN 6 - 20 mg/dL 10 7 6   Creatinine 0.44 - 1.00 mg/dL 540(J ) 8.11  Sodium 135 - 145 mmol/L 139 135 136  Potassium 3.5 - 5.1 mmol/L 4.3 3.8 4.0  Chloride 98 - 111 mmol/L 108 105 106  CO2 22 - 32 mmol/L 24 23 20(L)  Calcium 8.9 - 10.3 mg/dL 8.0(L) 7.4(L) 7.7(L)  Total Protein 6.5 - 8.1 g/dL 5.2(L) - -  Total Bilirubin 0.3 - 1.2 mg/dL 0.4 - -  Alkaline Phos 38 - 126 U/L 135(H) - -  AST 15 - 41 U/L 24 - -  ALT 0 - 44 U/L 15 - -    DG Chest 1 View  Result Date: 08/23/2019 CLINICAL DATA:  Status post right thoracentesis. EXAM: CHEST  1 VIEW COMPARISON:  Radiograph and lung bases from abdominal CT 08/22/2019 FINDINGS: No pneumothorax. Slight decreased right pleural effusion after thoracentesis. Left pleural effusion and basilar opacity are unchanged. Heterogeneous opacities throughout the right lung are unchanged. Left perihilar opacities are increasing. Left upper extremity PICC in place. Unchanged heart size and mediastinal contours. Hiatal hernia again seen. IMPRESSION: 1. No pneumothorax post right thoracentesis. Slight decreased right pleural effusion. 2. Unchanged left pleural effusion and basilar opacity. 3. Increasing left perihilar opacities. Heterogeneous opacities throughout the right lung are unchanged. Findings may represent pneumonia versus pulmonary edema. Electronically  Signed   By: Keith Rake M.D.   On: 08/23/2019 14:00   IR THORACENTESIS ASP PLEURAL SPACE W/IMG GUIDE  Result Date: 08/23/2019 INDICATION: Patient with history of COPD, pneumonia, anemia, enteritis, bilateral pleural effusions; request received for diagnostic and therapeutic right thoracentesis. EXAM: ULTRASOUND GUIDED DIAGNOSTIC AND THERAPEUTIC RIGHT THORACENTESIS MEDICATIONS: None COMPLICATIONS: None immediate. PROCEDURE: An ultrasound guided thoracentesis was thoroughly discussed with the patient/ patient's son and questions answered. The benefits, risks, alternatives and  complications were also discussed. The patient understands and wishes to proceed with the procedure. Written consent was obtained. Ultrasound was performed to localize and mark an adequate pocket of fluid in the right chest. The area was then prepped and draped in the normal sterile fashion. 1% Lidocaine was used for local anesthesia. Under ultrasound guidance a 6 Fr Safe-T-Centesis catheter was introduced. Thoracentesis was performed. The catheter was removed and a dressing applied. FINDINGS: A total of approximately 450 cc of light yellow fluid was removed. Samples were sent to the laboratory as requested by the clinical team. IMPRESSION: Successful ultrasound guided diagnostic and therapeutic right thoracentesis yielding 450 cc of pleural fluid. Read by: Rowe Robert, PA-C Electronically Signed   By: Corrie Mckusick D.O.   On: 08/23/2019 13:44    Assessment/Plan:  Principal Problem:   Protein-calorie malnutrition, severe Active Problems:   Urinary retention   Nausea with vomiting   Colitis   Hematochezia   Ascites   Hypoalbuminemia   Enteritis   Generalized abdominal pain   Gastritis and gastroduodenitis   Acute esophagitis   Encephalopathy   Aspiration into airway   Dyspnea   Adult failure to thrive   Palliative care by specialist   Goals of care, counseling/discussion   DNR (do not resuscitate)    Patient Summary: Catherine Butler is a 59 y.o. with pertinent PMH of COPD, hypertension, GBS admit for enteritis versus chronic pancreatitis with significant protein-caloric malnutrition on hospital day 9  #Hypoxemia with respiratory distress, multifactorial etiology: #Pneumonitis vs aspiration pneumonia: #COPD exacerbation #Transudative Pleural Effusion Patient continues to have respiratory distress with hypoxemia without supplemental oxygen.  Previous ABG showed a respiratory alkalosis with recent NIF of -10 and BC of 150 mL which is concerning for worsening of her respiratory status.   IR performed thoracentesis yesterday with minimal volume removed.  Fluid analysis showed transudative effusion likely secondary to protein-caloric malnutrition and cirrhosis. It is  difficult to determine what the etiology of her ongoing respiratory distress is.  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  Patient is actively wheezing but does not show signs of CO2 retention. Patient's respiratory status is very tenuous with high risk of decompensation.  Patient recently switched to DNR/DNI as all efforts to correct reversible medical issues have been unsuccessful.  I will repeat ABG and talk to neurology about the likelihood of recurrent GBS affecting her clinical picture. 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  Patient's albumin is at 1 today which is elevated from her previous albumin of less than 1.  Mild improvement on TPN. -Continue TPN -Continue Phenergan, sucralfate, PPI  #Anemia: Patient has a stable macrocytic anemia today.  We will continue to monitor.   Diet: TPN IVF: None,None VTE: SCDs Code: Comfort Care PT/OT  recs: None TOC recs: none   Dispo: Anticipated discharge pending.    Dellia Cloud, D.O. [facility], PGY-1 Date 08/24/2019 Time 5:52 AM

## 2019-08-24 NOTE — Progress Notes (Signed)
Daily Progress Note   Patient Name: Catherine Butler       Date: 08/24/2019 DOB: 1960/06/22  Age: 59 y.o. MRN#: [id number] Attending Physician: Catherine Poll, MD Primary Care Physician: Catherine Sessions, NP Admit Date: 08/18/2019  Reason for Consultation/Follow-up: Establishing goals of care  Subjective: Confused, speaks a few words, does not accurately answer questions. Son is at bedside - she calls him her grandson.   Length of Stay: 9  Current Medications: Scheduled Meds:   aspirin EC  81 mg Oral Daily   bisacodyl  10 mg Rectal Daily   Chlorhexidine Gluconate Cloth  6 each Topical Daily   feeding supplement (ENSURE ENLIVE)  237 mL Oral TID BM   insulin aspart  0-9 Units Subcutaneous Q6H   levalbuterol  0.63 mg Nebulization Q6H   mouth rinse  15 mL Mouth Rinse BID   methylPREDNISolone (SOLU-MEDROL) injection  40 mg Intravenous Daily   multivitamin with minerals  1 tablet Oral Daily   pantoprazole (PROTONIX) IV  40 mg Intravenous Q12H   sodium chloride flush  10-40 mL Intracatheter Q12H   sucralfate  1 g Oral TID WC & HS    Continuous Infusions:  ampicillin-sulbactam (UNASYN) IV 1.5 g (08/24/19 1236)   TPN ADULT (ION) 70 mL/hr at 08/23/19 1738   TPN ADULT (ION)      PRN Meds: anti-nausea, HYDROmorphone (DILAUDID) injection, lidocaine (PF), lidocaine (PF), promethazine, sodium chloride flush  Physical Exam Constitutional:      General: She is not in acute distress.    Comments: Lethargic, disoriented, slightly agitated and delirious  Cardiovascular:     Rate and Rhythm: Tachycardia present.  Pulmonary:     Effort: No respiratory distress.  Musculoskeletal:     Right lower leg: No edema.     Left lower leg: No edema.  Skin:    General: Skin is warm and dry.    Neurological:     Mental Status: She is disoriented.  Psychiatric:        Attention and Perception: She is inattentive.        Speech: Speech is slurred.        Behavior: Behavior is slowed.        Cognition and Memory: Cognition is impaired.             Vital Signs: BP (!) 113/92 (BP Location: Right Wrist)    Pulse (!) 132    Temp 98.9 F (37.2 C) (Oral)    Resp (!) 21    Ht 5\' 6"  (1.676 m)    Wt 61.1 kg    LMP 02/22/2006 Comment: tubal ligation   SpO2 97%    BMI 21.74 kg/m  SpO2: SpO2: 97 % O2 Device: O2 Device: Nasal Cannula O2 Flow Rate: O2 Flow Rate (L/min): 3 L/min  Intake/output summary:   Intake/Output Summary (Last 24 hours) at 08/24/2019 1321 Last data filed at 08/24/2019 10/24/2019 Gross per 24 hour  Intake 416.15 ml  Output 775 ml  Net -358.85 ml   LBM: Last BM Date: 08/20/19 Baseline Weight: Weight: 52.2 kg Most recent weight: Weight: 61.1 kg       Palliative Assessment/Data:PPS 20%    Flowsheet Rows  Most Recent Value  Intake Tab  Referral Department  -- [[facility]]  Unit at Time of Referral  Intermediate Care Unit  Palliative Care Primary Diagnosis  Pulmonary  Date Notified  08/23/19  Palliative Care Type  New Palliative care  Reason for referral  Clarify Goals of Care  Date of Admission  08/05/2019  Date first seen by Palliative Care  08/23/19  # of days Palliative referral response time  0 Day(s)  # of days IP prior to Palliative referral  9  Clinical Assessment  Palliative Performance Scale Score  20%  Psychosocial & Spiritual Assessment  Palliative Care Outcomes  Patient/Family meeting held?  Yes  Who was at the meeting?  son  Palliative Care Outcomes  Clarified goals of care, Changed CPR status      Patient Active Problem List   Diagnosis Date Noted   Aspiration into airway    Dyspnea    Adult failure to thrive    Palliative care by specialist    Goals of care, counseling/discussion    DNR (do not resuscitate)    Encephalopathy     Generalized abdominal pain    Gastritis and gastroduodenitis    Acute esophagitis    Hiatal hernia    Colitis 08/15/2019   Hematochezia 08/15/2019   Ascites 08/15/2019   Hypoalbuminemia    Enteritis    Pancreatic atrophy    Nausea with vomiting 08/05/2019   Urinary retention 06/12/2019   Odynophagia 06/12/2019   Anemia 06/12/2019   Acute renal failure (ARF) (HCC) 06/02/2019   High anion gap metabolic acidosis 06/02/2019   Hyponatremia 06/02/2019   Acute urinary retention    Protein-calorie malnutrition, severe 07/21/2017   Colonic mass 07/18/2017   Dehydration 07/18/2017   Hypomagnesemia 07/18/2017   History of prolonged Q-T interval on ECG 07/06/2017   Tachycardia    Generalized anxiety disorder    Debility 06/23/2017   Constipation    Quadriplegia and quadriparesis (HCC)    Guillain Barr syndrome (HCC)    Hypokalemia 06/21/2017   Sinus tachycardia 06/21/2017   Essential hypertension 04/14/2017   Paresthesia    Bilateral leg weakness - chronic, after Guillian Barre episode 04/11/2017   Tobacco abuse 01/01/2017   Abnormal LFTs 01/01/2017   Depression    COPD (chronic obstructive pulmonary disease) (HCC) 06/16/2015    Palliative Care Assessment & Plan   HPI: 59 y.o. female  with past medical history of COPD, guillain-barre/AIDP w/ chronic weakness, HTN, and ETOH use admitted on 07/26/2019 with abdominal pain, nausea, and vomiting. She was found to be severely dehydrated with multiple severe electrolyte abnormalities. She had a very low albumin on admission which has further declined and is now undetectable. She now has bilateral pleural effusions and worsening respiratory status. Being treated for pneumonitis vs aspiration pna and COPD exacerbation. Patient is not tolerating PO intake and has been started of TPN. MRI shows dilation of bowel loop. She has severe protein calorie malnutrition causing profound hypoalbuminemia and anasarca.  Hgb continues to drop despite transfusion - she has ETOH use with significant esophagitis/gastritis. Patient has not improved since admission and now has worsening respiratory status. PMT consulted to assist with GOC.   Assessment: Respiratory status appears better today. More awake but seems delirious/agitated. Remains tachycardic.  Son, Catherine Butler, at bedside. We discussed [name]'s clinical condition and all questions were answered.  GOC remain clear to continue current care for now with limit of DNR. He would want to transition to full comfort if Ms. Brook  were rapidly declining or appeared to be suffering.   Recommendations/Plan:  DNR/DNI  PMT to follow up  Continue current care, but if she were to rapidly decline transition to full comfort care  Code Status:  DNR  Prognosis:   Unable to determine  Discharge Planning:  To Be Determined  Care plan was discussed with son Merrily Pew and April, RN  Thank you for allowing the Palliative Medicine Team to assist in the care of this patient.   Total Time 25 minutes Prolonged Time Billed  no       Greater than 50%  of this time was spent counseling and coordinating care related to the above assessment and plan.  Juel Burrow, DNP, [facility] Palliative Medicine Team Team Phone # [phone]  Pager [phone]

## 2019-08-24 NOTE — Progress Notes (Signed)
Spoke with patient regarding ABG. Patient refuses ABG at this time. RN at bedside.

## 2019-08-24 NOTE — Progress Notes (Addendum)
PHARMACY - TOTAL PARENTERAL NUTRITION CONSULT NOTE   Indication: Severe esophagitis   Patient Measurements: Height: '5\' 6"'$  (167.6 cm) Weight: 134 lb 11.2 oz (61.1 kg) IBW/kg (Calculated) : 59.3 TPN AdjBW (KG): 52.2 Body mass index is 21.74 kg/m. Usual Weight: 61 kg  Assessment: 59 year old female with PMH significant for COPD, HTN, Guillan Barre, quadriplegia, ETOH abuse, and recent admission for colitis now presenting with ascites and colitis on 08/18/2019. Patient has not been eating well since discharge a week prior and feeling that food has been getting stuck in her throat leading to vomiting. Patient has severe muscle depletion and poor po intake due to loss of appetite for >1 month.   Glucose / Insulin: CBG controlled (112-151). 5 units of sSSI in last 24 hours. 3/4 Started om Methylpred 40 IV qday Electrolytes: K 4.3, Mg 1.8, Phos 4.1. CoCa 10.4- will reduce in TPN and monitor. Others within normal limits.  Renal: SCr stable at 0.44 (monitoring closely with quadriplegia), BUN 15 (trend up) LFTs / TGs: Elevated Alk Phos (152). TG elevated at 208. Others within normal limits. Prealbumin / albumin: Pre-albumin 6.4 >>5.3>> less than 5; Alb 1.1 >> 1.  Intake / Output; MIVF: UOP 1.2 mL/kg/hr, LBM 3/1. Anasarca noted on CT scan.  GI Imaging: 08/15/19 CT Abdomen - moderate bilateral pleural effusions, esophagitis and hiatal hernia, large volume ascites 08/22/19 CT Abdomen - PNA + increased bilateral effusions and anasarca, advanced fatty infiltration of liver suggestive of early cirrhosis, thickened small bowel loops from ascites and underlying liver disease- cannot exclude enteritis, no bowel obstruction Surgeries / Procedures:  08/15/19 - L paracentesis with 900 mL drained 08/12/2019 - EGD: severe esophagitis, 7 cm hiatal hernia, portal hypertensive gastropathy, moderate gastritis, two duodenal polyps which were resected.   Central access: PICC, 08/17/19 TPN start date: 08/17/19  Nutritional Goals  (per RD recommendation on 08/22/19): KCal: 1600-1800, Protein: 80-95, Fluid: >= 1.6 L/ day Goal TPN rate is 70 mL/hr (provides 94 g of protein and 1737 kcals per day)  Current Nutrition:  FLD - poor intake, increased WOB and abdominal pain  Ensure Enlive TID - none  Plan:  Continue TPN at goal rate of 70 mL/hr. Electrolytes in TPN: 16mq/L of Na, 670m/L of K, reduce to 2.43m3mL of Ca, 143m27m of Mg, and 143mm43m of Phos. Cl:Ac ratio 1:1 Continue MVI/TE as oral for now. Add Thiamine and folic acid to TPN and discontinue IV pushes.  Continue Sensitive q6h SSI and adjust as needed  Monitor labs  Follow-up ability to take po intake versus Cortrak placement   Monitor volume status - may need to concentrate TPN   CathyAlanda SlimrmD, FCCM [facility] Pharmacist Please see AMION for all Pharmacists' Contact Phone Numbers 08/24/2019, 8:55 AM

## 2019-08-24 NOTE — TOC Initial Note (Signed)
Transition of Care [facility]) - Initial/Assessment Note    Patient Details  Name: Catherine Butler MRN: [id number] Date of Birth: 1960-06-29  Transition of Care [facility]) CM/SW Contact:    Alberteen Sam, LCSW Phone Number: 08/24/2019, 1:10 PM  Clinical Narrative:                  CSW spoke with patient son Merrily Pew regarding discharge planning and new SNF recommendation. He reports he is in agreement with this and has no preference of facility however would like to stay near [facility] if possible. He gave CSW preference to fax out referrals to SNFs. CSW informed Merrily Pew that due to patient's medicaid she would need to give up monthly check to SNF, he reports this is not a problem.   CSW has faxed out referrals, pending bed offers at this time.   Expected Discharge Plan: Skilled Nursing Facility Barriers to Discharge: Continued Medical Work up   Patient Goals and CMS Choice Patient states their goals for this hospitalization and ongoing recovery are:: to get better CMS Medicare.gov Compare Post Acute Care list provided to:: Patient Represenative (must comment)(son Josh) Choice offered to / list presented to : Adult Children  Expected Discharge Plan and Services Expected Discharge Plan: [place] In-house Referral: NA Discharge Planning Services: CM Consult Post Acute Care Choice: [place] Living arrangements for the past 2 months: Single Family Home                 DME Arranged: (NA)         HH Arranged: NA          Prior Living Arrangements/Services Living arrangements for the past 2 months: Single Family Home Lives with:: Self Patient language and need for interpreter reviewed:: Yes Do you feel safe going back to the place where you live?: No   needs short term rehab  Need for Family Participation in Patient Care: Yes (Comment) Care giver support system in place?: Yes (comment)   Criminal Activity/Legal Involvement Pertinent to Current  Situation/Hospitalization: No - Comment as needed  Activities of Daily Living Home Assistive Devices/Equipment: Walker (specify type) ADL Screening (condition at time of admission) Patient's cognitive ability adequate to safely complete daily activities?: Yes Is the patient deaf or have difficulty hearing?: No Does the patient have difficulty seeing, even when wearing glasses/contacts?: No Does the patient have difficulty concentrating, remembering, or making decisions?: No Patient able to express need for assistance with ADLs?: Yes Does the patient have difficulty dressing or bathing?: No Independently performs ADLs?: Yes (appropriate for developmental age) Does the patient have difficulty walking or climbing stairs?: Yes Weakness of Legs: Both Weakness of Arms/Hands: None  Permission Sought/Granted Permission sought to share information with : Case Manager, Customer service manager, Family Supports Permission granted to share information with : Yes, Verbal Permission Granted  Share Information with NAME: Josh  Permission granted to share info w AGENCY: SNFs  Permission granted to share info w Relationship: son  Permission granted to share info w Contact Information: [phone]  Emotional Assessment Appearance:: Other (Comment Required(unable to assess) Attitude/Demeanor/Rapport: Unable to Assess Affect (typically observed): Unable to Assess Orientation: : Oriented to Self Alcohol / Substance Use: Not Applicable Psych Involvement: No (comment)  Admission diagnosis:  Colitis [K52.9] Generalized abdominal pain [R10.84] Other ascites [R18.8] Ascites [R18.8] Aspiration into airway [T17.908A] Patient Active Problem List   Diagnosis Date Noted  . Aspiration into airway   . Dyspnea   . Adult  failure to thrive   . Palliative care by specialist   . Goals of care, counseling/discussion   . DNR (do not resuscitate)   . Encephalopathy   . Generalized abdominal pain   .  Gastritis and gastroduodenitis   . Acute esophagitis   . Hiatal hernia   . Colitis 08/15/2019  . Hematochezia 08/15/2019  . Ascites 08/15/2019  . Hypoalbuminemia   . Enteritis   . Pancreatic atrophy   . Nausea with vomiting 08/05/2019  . Urinary retention 06/12/2019  . Odynophagia 06/12/2019  . Anemia 06/12/2019  . Acute renal failure (ARF) (HCC) 06/02/2019  . High anion gap metabolic acidosis 06/02/2019  . Hyponatremia 06/02/2019  . Acute urinary retention   . Protein-calorie malnutrition, severe 07/21/2017  . Colonic mass 07/18/2017  . Dehydration 07/18/2017  . Hypomagnesemia 07/18/2017  . History of prolonged Q-T interval on ECG 07/06/2017  . Tachycardia   . Generalized anxiety disorder   . Debility 06/23/2017  . Constipation   . Quadriplegia and quadriparesis (HCC)   . Guillain Barr syndrome (HCC)   . Hypokalemia 06/21/2017  . Sinus tachycardia 06/21/2017  . Essential hypertension 04/14/2017  . Paresthesia   . 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 Tobacco abuse 01/01/2017  . Abnormal LFTs 01/01/2017  . Depression   . COPD (chronic obstructive pulmonary disease) (HCC) 06/16/2015   PCP:  Grayce Sessions, NP Pharmacy:   [facility] - [place], [place] - [address] [place] [address] Phone: [phone] Fax: [phone]  [facility] [place], [place] - [address] [place] [address] Phone: [phone] Fax: [phone]     Social Determinants of Health (SDOH) Interventions    Readmission Risk Interventions Readmission Risk Prevention Plan 08/17/2019 08/17/2019  Transportation Screening Complete Complete  PCP or Specialist Appt within 3-5 Days Complete -  HRI or Home Care Consult (No Data) -  Social Work Consult for Recovery Care Planning/Counseling - Complete  Palliative Care Screening Not Applicable Not Applicable   Medication Review (RN Care Manager) Complete Complete  Some recent data might be hidden

## 2019-08-24 NOTE — Consult Note (Addendum)
                    NEURO HOSPITALIST CONSULT NOTE   Requesting physician: Dr. Guilloud   Reason for Consult:AMS GBS   History obtained from:  Chart review/ son  HPI:                                                                                                                                          Catherine Butler is an 59 y.o. female with PMH COPD, ETOH abuse, HTN, depression. , fatty liver.   Initially admitted 9 days ago for recurrent abdominal pain. Patient intermittently a reliable historian. States that she feels weak all over. Denies any numbness/tingling or focal weakness. Per son patient began having hallucinations when she was discharged from her hospital admission around February 14th. He also states that he noticed that she was weak (not walking) and needing assistance prior to this admission 9 days ago. He feels that her weakness has been getting progressively worse since January 2021, and over the past year he has noticed a steady decline since death of family members.   Hospital course: 2/23:admitted 3/5 neurology consulted for GBS, palliative care consult,, started on high dose thiamine. BG: 126, alk phos: 152 hgb: 7.9 NIF -20 VC 0.9L   Patient was seen here 03/2017 and diagnosed with a variant of GBS.  LP was without pleocytosis and protein was normal. She was given IVIG empirically for 5 days with minimal improvement.  Saw Dr. Rebecca Tat in f/u 08/2017: abnormal movements were thought to possible be gabapentin induced myoclonus. Recommended MRI: which read normal except for subcentimeter focus of nonspecific gliosis in right parietal white matter. NOTE: patient is extremely claustrophobic and MRI was done in an open MRI.    Past Medical History:  Diagnosis Date  . Anxiety   . Arthritis   . Asthma   . Complication of anesthesia    nausea  . COPD (chronic obstructive pulmonary disease) (HCC)   . Depression   . Guillain-Barre (HCC) 2018  . H/O alcohol abuse    . Hypertension    pt denies  . Nausea & vomiting 07/2019  . PONV (postoperative nausea and vomiting)   . Reflux   . Suicide attempt by multiple drug overdose (HCC) 2014    Past Surgical History:  Procedure Laterality Date  . BIOPSY  07/31/2019   Procedure: BIOPSY;  Surgeon: Cirigliano, Vito V, DO;  Location: [facility];  Service: Gastroenterology;;  . COLONOSCOPY WITH PROPOFOL N/A 07/21/2017   Procedure: COLONOSCOPY WITH PROPOFOL;  Surgeon: Jacobs, Daniel P, MD;  Location: [facility];  Service: Endoscopy;  Laterality: N/A;  . ENTEROSCOPY N/A 07/23/2019   Procedure: ENTEROSCOPY;  Surgeon: Cirigliano, Vito V, DO;  Location: [facility];  Service: Gastroenterology;  Laterality: N/A;  . IR PARACENTESIS  08/15/2019  . IR THORACENTESIS ASP PLEURAL SPACE   W/IMG GUIDE  08/23/2019  . LAPAROSCOPY    . laporscopy surgery for ovarian cyst  20 years ago  . SUBMUCOSAL TATTOO INJECTION  08/13/2019   Procedure: SUBMUCOSAL TATTOO INJECTION;  Surgeon: Cirigliano, Vito V, DO;  Location: [facility];  Service: Gastroenterology;;  . TONSILLECTOMY    . TUBAL LIGATION      Family History  Problem Relation Age of Onset  . Diabetes Father   . Hypertension Father 50       heart attack  . Kidney disease Father   . Heart attack Father   . Heart failure Father   . Dementia Father   . Breast cancer Mother 30       lump removed, bile duct cancer  . COPD Mother   . Hypertension Brother   . Allergic rhinitis Paternal Grandfather        Social History:  reports that she has been smoking cigarettes. She has a 40.00 pack-year smoking history. She has never used smokeless tobacco. She reports current alcohol use. She reports that she does not use drugs.  Allergies  Allergen Reactions  . 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 Lipitor [Atorvastatin] Other (See Comments)    "really bad leg pain"    MEDICATIONS:                                                                                                                      Scheduled: . aspirin EC  81 mg Oral Daily  . bisacodyl  10 mg Rectal Daily  . Chlorhexidine Gluconate Cloth  6 each Topical Daily  . feeding supplement (ENSURE ENLIVE)  237 mL Oral TID BM  . insulin aspart  0-9 Units Subcutaneous Q6H  . [START ON 08/25/2019] levalbuterol  0.63 mg Nebulization BID  . mouth rinse  15 mL Mouth Rinse BID  . methylPREDNISolone (SOLU-MEDROL) injection  40 mg Intravenous Daily  . multivitamin with minerals  1 tablet Oral Daily  . pantoprazole (PROTONIX) IV  40 mg Intravenous Q12H  . sodium chloride flush  10-40 mL Intracatheter Q12H  . sucralfate  1 g Oral TID WC & HS   Continuous: . ampicillin-sulbactam (UNASYN) IV 1.5 g (08/24/19 1236)  . thiamine injection    . TPN ADULT (ION) 70 mL/hr at 08/23/19 1738  . TPN ADULT (ION)     PRN:anti-nausea, HYDROmorphone (DILAUDID) injection, lidocaine (PF), lidocaine (PF), promethazine, sodium chloride flush   ROS:                                                                                                                                         ROS was performed and is negative except as noted in HPI    Blood pressure (!) 113/92, pulse (!) 132, temperature 98.9 F (37.2 C), temperature source Oral, resp. rate (!) 21, height 5' 6" (1.676 m), weight 61.1 kg, last menstrual period 02/22/2006, SpO2 97 %.   General Examination:                                                                                                       Physical Exam  Constitutional: Appears well-developed and well-nourished.  Psych: Affect appropriate to situation Eyes: Normal external eye and conjunctiva. HENT: Normocephalic, no lesions, without obvious abnormality.   Musculoskeletal-no joint tenderness, deformity or swelling Cardiovascular: Normal rate and regular rhythm.  Respiratory: Effort normal, non-labored breathing saturations WNL on 2L [place] GI: Soft.  No distension. There is no  tenderness.  Skin: WDI  Neurological Examination Mental Status: Alert, oriented name/age/mont/ president. Did not know the year. Has intermittent confusion. Told me she was at a friends house who was allowing her to receive treatment, but then said she was in the hospital. Also, began talking to someone who was not in the room for about 5 seconds., thought content  Mostly appropriate.  Speech fluent without evidence of aphasia.  Able to follow commands without difficulty. Poor attention/concentration. Naming intact. Cranial Nerves: II:  Visual fields grossly normal,  III,IV, VI: ptosis not present, extra-ocular motions intact bilaterally pupils equal, round, reactive to light and accommodation V,VII: smile symmetric, facial light touch sensation normal bilaterally VIII: hearing normal bilaterally IX,X: uvula rises symmetrically XI: bilateral shoulder shrug XII: midline tongue extension Motor:     Right  Left Shoulder Abduction  4/5  4/5 Elbow Flexion   4/5  4/5 Elbow Extension  4/5  4/5 Wrist Flexion   4/5  4/5 Wrist Extension  4/5  4/5   Hip Flexion   4/5  4/5 Knee Flexion   4/5  4/5 Knee Extension  4/5  4/5 Ankle Dorsiflexion  4/5  4/5 Ankle Plantarflexion  5/5  5/5  Tone and bulk:normal tone throughout; no atrophy noted Sensory: cool temp and light touch intact throughout, bilaterally Deep Tendon Reflexes: 2+ and symmetric biceps, triceps, patella 0 achilles ( Please see addendum)  Cerebellar: No ataxia noted with FNF, unable to perform HTS d/t lack of concentration Gait: deferred   Lab Results: Basic Metabolic Panel: Recent Labs  Lab 08/20/19 0600 08/20/19 0600 08/21/19 0146 08/21/19 0146 08/21/19 0547 08/21/19 0547 08/21/19 1700 08/21/19 1700 08/22/19 0221 08/23/19 0546 08/24/19 0631  NA 139   < > 136   < > 136  --  136  --  135 139 141  K 3.7   < > 3.9   < > 3.5  --  4.0  --  3.8 4.3 4.3  CL 111   < > 108   < > 109  --  106  --  105 108 106  CO2 22   < >  21*   < > 21*  --  20*  --  23 24 25  GLUCOSE   140*   < > 122*   < > 149*  --  139*  --  123* 156* 126*  BUN <5*   < > 6   < > 6  --  6  --  _0 CREATININE 0.57   < > 0.41*   < > 0.36*  --  0.47  --  0.41* 0.44 0.44  CALCIUM 7.2*   < > 7.2*   < > 7.0*   < > 7.7*   < > 7.4* 8.0* 8.2*  MG 1.7   < > 1.6*  --   --   --  1.7  --  2.0 1.9 1.8  PHOS 2.0*  --  2.6  --   --   --   --   --  3.2 3.8 4.1   < > = values in this interval not displayed.    CBC: Recent Labs  Lab 08/20/19 0600 08/21/19 0547 08/21/19 1700 08/21/19 2105 08/22/19 1128 08/23/19 0546 08/24/19 0631  WBC 7.6   < > 7.0 8.2 7.5 7.3 9.6  NEUTROABS 4.7  --   --   --   --   --   --   HGB 7.2*   < > 10.0* 9.6* 8.5* 7.8* 7.9*  HCT 22.8*   < > 32.1* 28.9* 25.6* 24.6* 25.6*  MCV 109.6*   < > 104.9* 99.3 108.9* 102.9* 106.2*  PLT 129*   < > 150 162 142* 153 183   < > = values in this interval not displayed.   Lipid Panel: Recent Labs  Lab 08/20/19 0600  TRIG 208*    Imaging: CT ABDOMEN WO CONTRAST  Result Date: 08/22/2019 CLINICAL DATA:  59 year old female with right upper quadrant abdominal pain. EXAM: CT ABDOMEN WITHOUT CONTRAST TECHNIQUE: Multidetector CT imaging of the abdomen was performed following the standard protocol without IV contrast. COMPARISON:  CT abdomen pelvis dated 08/15/2019. FINDINGS: Evaluation of this exam is limited in the absence of intravenous contrast. Lower chest: Partially visualized moderate bilateral pleural effusions, increased since the prior CT. There is associated partial compressive atelectasis of the lower lobes. There is new patchy areas of airspace opacity involving the right lower and right middle lobe which may represent asymmetric edema but concerning for pneumonia. Clinical correlation is recommended. 6 No intra-abdominal free air. There is a small upper abdominal ascites. Hepatobiliary: Advanced fatty infiltration of the liver. Minimal apparent irregularity of the liver contour  suggestive of early changes of cirrhosis. No intrahepatic biliary ductal dilatation. The gallbladder is unremarkable. Pancreas: Atrophic pancreas with scattered coarse calcification sequela of chronic pancreatitis. Spleen: Normal in size without focal abnormality. Adrenals/Urinary Tract: The adrenal glands are unremarkable. There is no hydronephrosis or nephrolithiasis on either side. The visualized ureters are unremarkable. Stomach/Bowel: There is a moderate size hiatal hernia. No evidence of bowel obstruction in the abdomen. Thickened appearance of loops of small bowel in the left hemiabdomen may be related to ascites and underlying liver disease. Enteritis is not excluded. Clinical correlation is recommended. Oral contrast noted throughout the colon presumably from prior study. Vascular/Lymphatic: Mild aortoiliac atherosclerotic disease. The IVC is unremarkable. No portal venous gas. There is no adenopathy. Other: Diffuse subcutaneous edema and anasarca. Musculoskeletal: Old left rib fractures. There is compression fracture of the superior endplate of L2 and Z61 as seen previously. No new fracture. IMPRESSION: 1. Increased bilateral pleural effusions and anasarca. 2. New patchy areas of airspace opacity in the right middle and right lower lobes concerning  for pneumonia. Clinical correlation is recommended. 3. Advanced fatty infiltration of the liver with findings of early cirrhosis. 4. Thickened appearance of loops of small bowel in the left hemiabdomen may be related to ascites and underlying liver disease. Enteritis is not excluded. Clinical correlation is recommended. No bowel obstruction. 5. Aortic Atherosclerosis (ICD10-I70.0). Electronically Signed   By: Anner Crete M.D.   On: 08/22/2019 22:13   DG Chest 1 View  Result Date: 08/23/2019 CLINICAL DATA:  Status post right thoracentesis. EXAM: CHEST  1 VIEW COMPARISON:  Radiograph and lung bases from abdominal CT 08/22/2019 FINDINGS: No pneumothorax.  Slight decreased right pleural effusion after thoracentesis. Left pleural effusion and basilar opacity are unchanged. Heterogeneous opacities throughout the right lung are unchanged. Left perihilar opacities are increasing. Left upper extremity PICC in place. Unchanged heart size and mediastinal contours. Hiatal hernia again seen. IMPRESSION: 1. No pneumothorax post right thoracentesis. Slight decreased right pleural effusion. 2. Unchanged left pleural effusion and basilar opacity. 3. Increasing left perihilar opacities. Heterogeneous opacities throughout the right lung are unchanged. Findings may represent pneumonia versus pulmonary edema. Electronically Signed   By: Keith Rake M.D.   On: 08/23/2019 14:00   IR THORACENTESIS ASP PLEURAL SPACE W/IMG GUIDE  Result Date: 08/23/2019 INDICATION: Patient with history of COPD, pneumonia, anemia, enteritis, bilateral pleural effusions; request received for diagnostic and therapeutic right thoracentesis. EXAM: ULTRASOUND GUIDED DIAGNOSTIC AND THERAPEUTIC RIGHT THORACENTESIS MEDICATIONS: None COMPLICATIONS: None immediate. PROCEDURE: An ultrasound guided thoracentesis was thoroughly discussed with the patient/ patient's son and questions answered. The benefits, risks, alternatives and complications were also discussed. The patient understands and wishes to proceed with the procedure. Written consent was obtained. Ultrasound was performed to localize and mark an adequate pocket of fluid in the right chest. The area was then prepped and draped in the normal sterile fashion. 1% Lidocaine was used for local anesthesia. Under ultrasound guidance a 6 Fr Safe-T-Centesis catheter was introduced. Thoracentesis was performed. The catheter was removed and a dressing applied. FINDINGS: A total of approximately 450 cc of light yellow fluid was removed. Samples were sent to the laboratory as requested by the clinical team. IMPRESSION: Successful ultrasound guided diagnostic and  therapeutic right thoracentesis yielding 450 cc of pleural fluid. Read by: Rowe Robert, PA-C Electronically Signed   By: Corrie Mckusick D.O.   On: 08/23/2019 13:44   Laurey Morale, MSN, NP-C [facility] [phone]  Attending neurologist's note to follow   08/24/2019, 3:08 PM  Assessment: 59 yo female with PMH COPD, ETOH abuse, HTN, depression who initially presented to [facility] for abdominal pain. Neurology consulted for concern of  GBS and AMS.  B1 is pending. Recommend high dose thiamine.. ABG showed respiratory alkalosis.  NEUROHOSPITALIST ADDENDUM Performed a face to face diagnostic evaluation.   I have reviewed the contents of history and physical exam as documented by PA/ARNP/Resident. I have discussed and formulated the above plan as documented.  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  I did a detailed chart review:  Was diagnosed in 2018 with possible AIDP , however no albuminocytological dissociation with protein of only 34.  (Cell count 3, RBC 252-likely from trauma).  Decision was made after clinical presentation of progressive paresthesias, areflexia noted in the patella and brachioradialis.  Other work-up included : B12 was 830, copper was normal.  MRI C and T-spine showed no acute  abnormality, ( also showed mild degenerative disc disease, epidural epidermal cyst and a remote compression fracture of T4 without retropulsion).  Her A1c was 4.8-so not diabetes.  CRP was mildly elevated of unclear etiology.  Neuro hospitalist at the time recommended transfer for EMG nerve conduction study-however it appears that transfer was declined.  Patient was discharged with plan for outpatient EMG nerve conduction study-however reviewing the notes this was never done.  I also did not see  thiamine level ordered.  She was seen in Dr. Tat's clinic-for abnormal movements, thought there were myoclonic movements.  She was areflexic per Dr. Tat's note even at that time.  An MRI brain was ordered, I did not see any mention of EMG nerve conduction study in her note.  She did feel her neuropathy may be from excessive alcohol.  Patient has since then presented multiple times with electrolyte imbalances, falls, nausea/vomiting, pancreatitis.  This admission, again patient presented with nausea/vomiting and abdominal pain-there was a concern for SBP.  Her ABG consistent with respiratory alkalosis.  TPN started for severe malnutrition-her albumin was less than 1.  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  Severe hypoalbuminemia-we will defer to medicine services to determine etiology.  Did not see cirrhosis mention in the CT abdomen report.    Recommendation check B1 level, please administer thiamine after checking B1 level. High dose thiamine 500mg IV TID x3 days Check echo for pleural effusion, as dry beri beri associated with cardiomyopathy/ Q12h NIF, if less than -20/concerned for resp failure:  either get ABG/or request patient to increase effort as patient easily gets demotivated     MD [facility] [phone]   If 7pm to 7am, please call on call as listed on AMION.  

## 2019-08-24 NOTE — Progress Notes (Signed)
NIF -20. VC 0.9L.

## 2019-08-24 NOTE — Progress Notes (Signed)
OT Cancellation Note  Patient Details Name: Catherine Butler MRN: [id number] DOB: 1960/11/11   Cancelled Treatment:    Reason Eval/Treat Not Completed: Other (comment) Per RN pt is hallucinating and not oriented at this time. Pt and family working with palliative to establish GOC. Will hold one more date to see if appropriate to continue OT POC or sign off.  Dalphine Handing, MSOT, OTR/L Acute Rehabilitation Services [facility] Office Number: [phone] Pager: [phone]  Dalphine Handing 08/24/2019, 3:05 PM

## 2019-08-24 NOTE — NC FL2 (Signed)
[place] MEDICAID FL2 LEVEL OF CARE SCREENING TOOL     IDENTIFICATION  Patient Name: Catherine Butler Birthdate: Dec 24, 1960 Sex: female Admission Date (Current Location): 08/09/2019  [facility] and [place] Number:  Producer, television/film/video and Address:  [facility], [address], [place], [place] [address]      Provider Number: [id number]  Attending Physician Name and Address:  Reymundo Poll, MD  Relative Name and Phone Number:  Jsoh (son)    Current Level of Care: Hospital Recommended Level of Care: Skilled Nursing Facility Prior Approval Number:    Date Approved/Denied:   PASRR Number:    Discharge Plan: SNF    Current Diagnoses: Patient Active Problem List   Diagnosis Date Noted  . Aspiration into airway   . Dyspnea   . Adult failure to thrive   . Palliative care by specialist   . Goals of care, counseling/discussion   . DNR (do not resuscitate)   . Encephalopathy   . Generalized abdominal pain   . Gastritis and gastroduodenitis   . Acute esophagitis   . Hiatal hernia   . Colitis 08/15/2019  . Hematochezia 08/15/2019  . Ascites 08/15/2019  . Hypoalbuminemia   . Enteritis   . Pancreatic atrophy   . Nausea with vomiting 08/05/2019  . Urinary retention 06/12/2019  . Odynophagia 06/12/2019  . Anemia 06/12/2019  . Acute renal failure (ARF) (HCC) 06/02/2019  . High anion gap metabolic acidosis 06/02/2019  . Hyponatremia 06/02/2019  . Acute urinary retention   . Protein-calorie malnutrition, severe 07/21/2017  . Colonic mass 07/18/2017  . Dehydration 07/18/2017  . Hypomagnesemia 07/18/2017  . History of prolonged Q-T interval on ECG 07/06/2017  . Tachycardia   . Generalized anxiety disorder   . Debility 06/23/2017  . Constipation   . Quadriplegia and quadriparesis (HCC)   . Guillain Barr syndrome (HCC)   . Hypokalemia 06/21/2017  . Sinus tachycardia 06/21/2017  . Essential hypertension 04/14/2017  . Paresthesia   . Bilateral  leg weakness - chronic, after Guillian Barre episode 04/11/2017  . Tobacco abuse 01/01/2017  . Abnormal LFTs 01/01/2017  . Depression   . COPD (chronic obstructive pulmonary disease) (HCC) 06/16/2015    Orientation RESPIRATION BLADDER Height & Weight     Self  O2(nasal cannula 3L/min) Continent(has a urethral cath bag) Weight: 134 lb 11.2 oz (61.1 kg) Height:  5\' 6"  (167.6 cm)  BEHAVIORAL SYMPTOMS/MOOD NEUROLOGICAL BOWEL NUTRITION STATUS      Continent Diet(see discharge summary)  AMBULATORY STATUS COMMUNICATION OF NEEDS Skin   Extensive Assist Verbally Other (Comment), Skin abrasions(abrasion left knee, ecchymosis hands)                       Personal Care Assistance Level of Assistance  Bathing, Feeding, Dressing, Total care Bathing Assistance: Limited assistance Feeding assistance: Independent Dressing Assistance: Limited assistance Total Care Assistance: Maximum assistance   Functional Limitations Info  Sight, Speech, Hearing Sight Info: Impaired Hearing Info: Adequate Speech Info: Adequate    SPECIAL CARE FACTORS FREQUENCY  PT (By licensed PT), OT (By licensed OT)     PT Frequency: min 5x weekly OT Frequency: min 5x weekly            Contractures Contractures Info: Not present    Additional Factors Info  Code Status, Allergies Code Status Info: DNR Allergies Info: Influenza vaccines, lipitor (atorvastatin)           Current Medications (08/24/2019):  This is  the current hospital active medication list Current Facility-Administered Medications  Medication Dose Route Frequency Provider Last Rate Last Admin  . ampicillin-sulbactam (UNASYN) 1.5 g in sodium chloride 0.9 % 100 mL IVPB  1.5 g Intravenous Q6H Velna Ochs, MD 200 mL/hr at 08/24/19 1236 1.5 g at 08/24/19 1236  . anti-nausea (EMETROL) solution 10 mL  10 mL Oral Q15 min PRN Cirigliano, Vito V, DO      . aspirin EC tablet 81 mg  81 mg Oral Daily Cirigliano, Vito V, DO   81 mg at 08/24/19  1010  . bisacodyl (DULCOLAX) suppository 10 mg  10 mg Rectal Daily Willia Craze, NP   10 mg at 08/24/19 1010  . Chlorhexidine Gluconate Cloth 2 % PADS 6 each  6 each Topical Daily Cirigliano, Vito V, DO   6 each at 08/22/19 0815  . feeding supplement (ENSURE ENLIVE) (ENSURE ENLIVE) liquid 237 mL  237 mL Oral TID BM Velna Ochs, MD   237 mL at 08/24/19 1012  . HYDROmorphone (DILAUDID) injection 0.5 mg  0.5 mg Intravenous Q6H PRN Marianna Payment, MD   0.5 mg at 08/24/19 1006  . insulin aspart (novoLOG) injection 0-9 Units  0-9 Units Subcutaneous Q6H Priscella Mann, RPH   1 Units at 08/24/19 0008  . levalbuterol (XOPENEX) nebulizer solution 0.63 mg  0.63 mg Nebulization Q6H Jose Persia, MD   0.63 mg at 08/24/19 0912  . lidocaine (PF) (XYLOCAINE) 1 % injection   Infiltration PRN Candiss Norse A, PA-C   10 mL at 08/15/19 0914  . lidocaine (PF) (XYLOCAINE) 1 % injection   Infiltration PRN Allred, Darrell K, PA-C   10 mL at 08/23/19 1328  . MEDLINE mouth rinse  15 mL Mouth Rinse BID Cirigliano, Vito V, DO   15 mL at 08/23/19 2148  . methylPREDNISolone sodium succinate (SOLU-MEDROL) 40 mg/mL injection 40 mg  40 mg Intravenous Daily Jose Persia, MD   40 mg at 08/24/19 1001  . multivitamin with minerals tablet 1 tablet  1 tablet Oral Daily Cirigliano, Vito V, DO   1 tablet at 08/23/19 1021  . pantoprazole (PROTONIX) injection 40 mg  40 mg Intravenous Q12H Cirigliano, Vito V, DO   40 mg at 08/24/19 1011  . promethazine (PHENERGAN) injection 12.5 mg  12.5 mg Intravenous Q6H PRN Cirigliano, Vito V, DO   12.5 mg at 08/24/19 0545  . sodium chloride flush (NS) 0.9 % injection 10-40 mL  10-40 mL Intracatheter Q12H Cirigliano, Vito V, DO   10 mL at 08/24/19 1002  . sodium chloride flush (NS) 0.9 % injection 10-40 mL  10-40 mL Intracatheter PRN Cirigliano, Vito V, DO      . sucralfate (CARAFATE) 1 GM/10ML suspension 1 g  1 g Oral TID WC & HS Cirigliano, Vito V, DO   1 g at 08/23/19 2148  . TPN  ADULT (ION)   Intravenous Continuous TPN Priscella Mann, RPH 70 mL/hr at 08/23/19 1738 New Bag at 08/23/19 1738  . TPN ADULT (ION)   Intravenous Continuous TPN Velna Ochs, MD         Discharge Medications: Please see discharge summary for a list of discharge medications.  Relevant Imaging Results:  Relevant Lab Results:   Additional Information SSN: [id number]  Alberteen Sam, LCSW

## 2019-08-24 NOTE — Progress Notes (Signed)
NIF -10, VC 1.0L.

## 2019-08-24 NOTE — Evaluation (Addendum)
Clinical/Bedside Swallow Evaluation Patient Details  Name: JASELLE PRYER MRN: [id number] Date of Birth: 19-Dec-1960  Today's Date: 08/24/2019 Time: SLP Start Time (ACUTE ONLY): 1438 SLP Stop Time (ACUTE ONLY): 1450 SLP Time Calculation (min) (ACUTE ONLY): 12 min  Past Medical History:  Past Medical History:  Diagnosis Date  . Anxiety   . Arthritis   . Asthma   . Complication of anesthesia    nausea  . COPD (chronic obstructive pulmonary disease) ([place])   . Depression   . Guillain-Barre ([place]) 2018  . H/O alcohol abuse   . Hypertension    pt denies  . Nausea & vomiting 07/2019  . PONV (postoperative nausea and vomiting)   . Reflux   . Suicide attempt by multiple drug overdose ([place]) 2014   Past Surgical History:  Past Surgical History:  Procedure Laterality Date  . BIOPSY  September 14, 2019   Procedure: BIOPSY;  Surgeon: Lavena Bullion, DO;  Location: [facility];  Service: Gastroenterology;;  . COLONOSCOPY WITH PROPOFOL N/A 07/21/2017   Procedure: COLONOSCOPY WITH PROPOFOL;  Surgeon: Milus Banister, MD;  Location: [facility];  Service: Endoscopy;  Laterality: N/A;  . ENTEROSCOPY N/A 09-14-2019   Procedure: ENTEROSCOPY;  Surgeon: Lavena Bullion, DO;  Location: [facility];  Service: Gastroenterology;  Laterality: N/A;  . IR PARACENTESIS  08/15/2019  . IR THORACENTESIS ASP PLEURAL SPACE W/IMG GUIDE  08/23/2019  . LAPAROSCOPY    . laporscopy surgery for ovarian cyst  20 years ago  . SUBMUCOSAL TATTOO INJECTION  Sep 14, 2019   Procedure: SUBMUCOSAL TATTOO INJECTION;  Surgeon: Lavena Bullion, DO;  Location: [facility];  Service: Gastroenterology;;  . TONSILLECTOMY    . TUBAL LIGATION     HPI:  59 y.o. female  with past medical history of COPD, guillain-barre/AIDP w/ chronic weakness, HTN, and ETOH use admitted on 07/24/2019 with abdominal pain, nausea, and vomiting. She was found to be severely dehydrated with multiple severe electrolyte abnormalities. Being treated for  pneumonitis vs aspiration pna and COPD exacerbation. Patient  ETOH use with significant esophagitis/gastritis.  Palliative following for GOC. Patient known to [place], participated in [place] 08/10/19: grossly normal swallow function. However, MBSS revealed esophageal retention and assessment of the esophageal phase was recommended.  CXR 08/22/19: Stable diffuse right lung opacities concerning for pneumonia.   Assessment / Plan / Recommendation Clinical Impression   Patient seen at bedside for clinical swallowing evaluation. Patient known to our service; She was most recently seen for MBSS on 08/10/19, MBSS indicated grossly functional swallow and no further ST was recommended at that time. Patient presents today with worsening respiratory status compared to prior evaluation. Per chart review, concern for asp PNA.  Patient sitting upright in bed, receiving 4L oxygen via [place].  RR mid to upper 20's throughout this evaluation (with and without PO) per tele. Son, Merrily Pew, present . Patient alert, but confused and mildly impulsive, frequently re-positioning herself. Oral mechanism exam unremarkable. Patient's oral cavity somewhat dry, dentition lower, edentulous upper. ST performed oral care prior to PO trials. Patient agreeable to trials of thin liquids (seen via cup and straw sips) and soft solids (chopped fruit). No overt s/sx aspiration observed with thin liquids or soft solids. With chopped fruit, pt with mildly prolonged mastication,  swallow initiation appeared timely. Patient denied odynophagia this date.   Recommend dysphagia 3 solids/thin liquids. ST to follow briefly.   SLP Visit Diagnosis: Dysphagia, unspecified (R13.10)    Aspiration Risk       Diet Recommendation  Dysphagia 3 (Mech soft);Thin liquid   Medication Administration: Whole meds with puree Supervision: Full supervision/cueing for compensatory strategies Compensations: Minimize environmental distractions;Slow rate;Small sips/bites;Follow  solids with liquid Postural Changes: Seated upright at 90 degrees;Remain upright for at least 30 minutes after po intake    Other  Recommendations Recommended Consults: Consider GI evaluation;Consider esophageal assessment Oral Care Recommendations: Oral care QID;Staff/trained caregiver to provide oral care   Follow up Recommendations None      Frequency and Duration min 1 x/week  1 week       Prognosis        Swallow Study   General HPI: 59 y.o. female  with past medical history of COPD, guillain-barre/AIDP w/ chronic weakness, HTN, and ETOH use admitted on 08-19-19 with abdominal pain, nausea, and vomiting. She was found to be severely dehydrated with multiple severe electrolyte abnormalities. Being treated for pneumonitis vs aspiration pna and COPD exacerbation. Patient  ETOH use with significant esophagitis/gastritis.  Palliative following for GOC. Patient known to ST service, participated in MBSS 08/10/19: grossly normal swallow function. However, MBSS revealed esophageal retention and assessment of the esophageal phase was recommended.  CXR 08/22/19: Stable diffuse right lung opacities concerning for pneumonia. Type of Study: Bedside Swallow Evaluation Previous Swallow Assessment: MBS 2/19 Diet Prior to this Study: Other (Comment)(full liquid diet) Temperature Spikes Noted: No Respiratory Status: Nasal cannula(4L) History of Recent Intubation: No Behavior/Cognition: Alert;Cooperative;Distractible;Requires cueing Oral Cavity Assessment: Dry Oral Care Completed by SLP: Yes Oral Cavity - Dentition: Missing dentition Vision: Functional for self-feeding Self-Feeding Abilities: Needs assist;Needs set up Patient Positioning: Upright in bed Baseline Vocal Quality: Normal Volitional Swallow: Able to elicit    Oral/Motor/Sensory Function Overall Oral Motor/Sensory Function: Within functional limits   Ice Chips Ice chips: Not tested   Thin Liquid Thin Liquid: Within functional  limits Presentation: Cup;Self Fed;Straw    Nectar Thick Nectar Thick Liquid: Not tested   Honey Thick Honey Thick Liquid: Not tested   Puree Puree: Not tested   Solid     Solid: Within functional limits      Jarita Raval 08/24/2019,3:12 PM   Shella Spearing, M.Ed., CCC-SLP Speech Therapy Acute Rehabilitation [phone]: Acute Rehab office [phone] - pager

## 2019-08-25 ENCOUNTER — Inpatient Hospital Stay (HOSPITAL_COMMUNITY): Payer: Medicaid Other

## 2019-08-25 DIAGNOSIS — T17908D Unspecified foreign body in respiratory tract, part unspecified causing other injury, subsequent encounter: Secondary | ICD-10-CM

## 2019-08-25 DIAGNOSIS — R0600 Dyspnea, unspecified: Secondary | ICD-10-CM

## 2019-08-25 DIAGNOSIS — Z515 Encounter for palliative care: Secondary | ICD-10-CM

## 2019-08-25 LAB — COMPREHENSIVE METABOLIC PANEL
ALT: 21 U/L (ref 0–44)
AST: 22 U/L (ref 15–41)
Albumin: 1.2 g/dL — ABNORMAL LOW (ref 3.5–5.0)
Alkaline Phosphatase: 146 U/L — ABNORMAL HIGH (ref 38–126)
Anion gap: 10 (ref 5–15)
BUN: 20 mg/dL (ref 6–20)
CO2: 21 mmol/L — ABNORMAL LOW (ref 22–32)
Calcium: 8.3 mg/dL — ABNORMAL LOW (ref 8.9–10.3)
Chloride: 107 mmol/L (ref 98–111)
Creatinine, Ser: 0.45 mg/dL (ref 0.44–1.00)
GFR calc Af Amer: >60 mL/min (ref >60)
GFR calc non Af Amer: >60 mL/min (ref >60)
Glucose, Bld: 141 mg/dL — ABNORMAL HIGH (ref 70–99)
Potassium: 4.6 mmol/L (ref 3.5–5.1)
Sodium: 138 mmol/L (ref 135–145)
Total Bilirubin: 0.4 mg/dL (ref 0.3–1.2)
Total Protein: 5.6 g/dL — ABNORMAL LOW (ref 6.5–8.1)

## 2019-08-25 LAB — CBC
HCT: 24.8 % — ABNORMAL LOW (ref 36.0–46.0)
Hemoglobin: 7.8 g/dL — ABNORMAL LOW (ref 12.0–15.0)
MCH: 32.9 pg (ref 26.0–34.0)
MCHC: 31.5 g/dL (ref 30.0–36.0)
MCV: 104.6 fL — ABNORMAL HIGH (ref 80.0–100.0)
Platelets: 183 10*3/uL (ref 150–400)
RBC: 2.37 MIL/uL — ABNORMAL LOW (ref 3.87–5.11)
RDW: 17.8 % — ABNORMAL HIGH (ref 11.5–15.5)
WBC: 9.2 10*3/uL (ref 4.0–10.5)
nRBC: 0 % (ref 0.0–0.2)

## 2019-08-25 LAB — GLUCOSE, CAPILLARY
Glucose-Capillary: 136 mg/dL — ABNORMAL HIGH (ref 70–99)
Glucose-Capillary: 145 mg/dL — ABNORMAL HIGH (ref 70–99)
Glucose-Capillary: 157 mg/dL — ABNORMAL HIGH (ref 70–99)
Glucose-Capillary: 190 mg/dL — ABNORMAL HIGH (ref 70–99)

## 2019-08-25 MED ORDER — THIAMINE HCL 100 MG/ML IJ SOLN
500.0000 mg | Freq: Three times a day (TID) | INTRAVENOUS | Status: AC
  Administered 2019-08-25 – 2019-08-27 (×9): 500 mg via INTRAVENOUS
  Filled 2019-08-25 (×9): qty 5

## 2019-08-25 MED ORDER — TRAVASOL 10 % IV SOLN
INTRAVENOUS | Status: AC
  Filled 2019-08-25: qty 940.8

## 2019-08-25 NOTE — TOC Progression Note (Signed)
Transition of Care [facility]) - Progression Note    Patient Details  Name: Catherine Butler MRN: [id number] Date of Birth: 09/29/1960  Transition of Care [facility]) CM/SW Contact  Nada Boozer Zaedyn Covin, LCSWA Phone Number: 08/25/2019, 10:17 AM  Clinical Narrative:     CSW called the patient's son, Donnie Coffin. CSW provided current bed offers to [facility] and [facility]. He asked if he could have some time to think about it. CSW agreed and asked if someone could contact him on Sunday.   CSW reminded him that his mother would have to sign over her check to help pay for her stay at a SNF. He stated that wouldn't be a problem.   CSW will follow up on Sunday for bed choice.   Expected Discharge Plan: Skilled Nursing Facility Barriers to Discharge: Continued Medical Work up  Expected Discharge Plan and Services Expected Discharge Plan: Skilled Nursing Facility In-house Referral: NA Discharge Planning Services: CM Consult Post Acute Care Choice: Skilled Nursing Facility Living arrangements for the past 2 months: Single Family Home                 DME Arranged: (NA)         HH Arranged: NA           Social Determinants of Health (SDOH) Interventions    Readmission Risk Interventions Readmission Risk Prevention Plan 08/17/2019 08/17/2019  Transportation Screening Complete Complete  PCP or Specialist Appt within 3-5 Days Complete -  HRI or Home Care Consult (No Data) -  Social Work Consult for Recovery Care Planning/Counseling - Complete  Palliative Care Screening Not Applicable Not Applicable  Medication Review Oceanographer) Complete Complete  Some recent data might be hidden

## 2019-08-25 NOTE — Progress Notes (Addendum)
PHARMACY - TOTAL PARENTERAL NUTRITION CONSULT NOTE   Indication: Severe esophagitis   Patient Measurements: Height: '5\' 6"'$  (167.6 cm) Weight: 134 lb 11.2 oz (61.1 kg) IBW/kg (Calculated) : 59.3 TPN AdjBW (KG): 52.2 Body mass index is 21.74 kg/m. Usual Weight: 61 kg  Assessment: 59 year old female with PMH significant for COPD, HTN, Guillan Barre, quadriplegia, ETOH abuse, and recent admission for colitis now presenting with ascites and colitis on 08/07/2019. Patient has not been eating well since discharge a week prior and feeling that food has been getting stuck in her throat leading to vomiting. Patient has severe muscle depletion and poor po intake due to loss of appetite for >1 month.   Glucose / Insulin: CBG controlled (136-184). 5 units of sSSI in last 24 hours. 3/4 Started om Methylpred 40 IV qday Electrolytes: K 4.6, Mg 1.8, Phos 4.1. CoCa 10.4- will reduce in TPN and monitor. Others within normal limits.  Renal: SCr stable at 0.45 (monitoring closely with quadriplegia), BUN 20 (trend up) LFTs / TGs: Elevated Alk Phos (152). TG elevated at 208. Others within normal limits. Prealbumin / albumin: Pre-albumin 6.4 >>5.3>> less than 5; Alb 1.1 >> 1>>1.2.  Intake / Output; MIVF: UOP 1.2 mL/kg/hr, LBM 3/1. Anasarca noted on CT scan.  GI Imaging: 08/15/19 CT Abdomen - moderate bilateral pleural effusions, esophagitis and hiatal hernia, large volume ascites 08/22/19 CT Abdomen - PNA + increased bilateral effusions and anasarca, advanced fatty infiltration of liver suggestive of early cirrhosis, thickened small bowel loops from ascites and underlying liver disease- cannot exclude enteritis, no bowel obstruction Surgeries / Procedures:  08/15/19 - L paracentesis with 900 mL drained 08/01/2019 - EGD: severe esophagitis, 7 cm hiatal hernia, portal hypertensive gastropathy, moderate gastritis, two duodenal polyps which were resected.   Central access: PICC, 08/17/19 TPN start date: 08/17/19  Nutritional  Goals (per RD recommendation on 08/22/19): KCal: 1600-1800, Protein: 80-95, Fluid: >= 1.6 L/ day Goal TPN rate is 70 mL/hr (provides 94 g of protein and 1737 kcals per day)  Current Nutrition:  Dys 3 diet - abdominal pain  Ensure Enlive TID - 2 charted  Plan:  Continue TPN at goal rate of 70 mL/hr. Electrolytes in TPN: 86mq/L of Na, 6228m/L of K, reduce to 2.28m27mL of Ca, 128m31m of Mg, and 128mm17m of Phos. Cl:Ac ratio 1:1 Continue MVI/TE as oral for now. Add Thiamine back to the TPN on 3/10 when high dose therapy complete and continue folic acid in TPN.  Continue Sensitive q6h SSI and adjust as needed  Monitor labs  Follow-up ability to take po intake versus Cortrak placement    CathyAlanda SlimrmD, FCCM [facility] LLCical Pharmacist Please see AMION for all Pharmacists' Contact Phone Numbers 08/25/2019, 7:25 AM

## 2019-08-25 NOTE — Progress Notes (Signed)
Tried to administer the NIF with the patient and she was unable to do the NIF, could not follow directions.

## 2019-08-25 NOTE — Progress Notes (Addendum)
Daily Progress Note   Patient Name: Catherine Butler       Date: 08/25/2019 DOB: 1961-05-03  Age: 59 y.o. MRN#: [id number] Attending Physician: Reymundo Poll, MD Primary Care Physician: Grayce Sessions, NP Admit Date: 08/18/2019  Reason for Consultation/Follow-up: Establishing goals of care and Psychosocial/spiritual support  Subjective: Visited patient at bedside.  RN and Resp therapy were working with her.  Patient awake and active.  Waived her arm to dismiss breathing treatment.  Pulling off oxygen.  Will respond politely (nice to meet you).  Per RN, today patient has been talking to people who are not present and seeing things that are not there.  She appears to try to get out of bed.  Assessment: Hyperactive delirium.  On her second day of 3 receiving high dose thiamine infusion.  Patient aspirates on a sip of liquid and can not clear her secretions.  Attending team continuing work up.   Patient Profile/HPI:  59 y.o. female  with past medical history of COPD, guillain-barre/AIDP w/ chronic weakness, HTN, and ETOH use admitted on 08/19/2019 with abdominal pain, nausea, and vomiting. She was found to be severely dehydrated with multiple severe electrolyte abnormalities. She had a very low albumin on admission which has further declined and is now undetectable. She now has bilateral pleural effusions and worsening respiratory status. Being treated for pneumonitis vs aspiration pna and COPD exacerbation. Patient is not tolerating PO intake and has been started of TPN. MRI shows dilation of bowel loop. She has severe protein calorie malnutrition causing profound hypoalbuminemia and anasarca. Hgb continues to drop despite transfusion - she has ETOH use with significant esophagitis/gastritis. Patient  has not improved since admission and now has worsening respiratory status. PMT consulted to assist with GOC.   Length of Stay: 10  Current Medications: Scheduled Meds:  . aspirin EC  81 mg Oral Daily  . bisacodyl  10 mg Rectal Daily  . Chlorhexidine Gluconate Cloth  6 each Topical Daily  . feeding supplement (ENSURE ENLIVE)  237 mL Oral TID BM  . insulin aspart  0-9 Units Subcutaneous Q6H  . levalbuterol  0.63 mg Nebulization BID  . mouth rinse  15 mL Mouth Rinse BID  . methylPREDNISolone (SOLU-MEDROL) injection  40 mg Intravenous Daily  . multivitamin with minerals  1 tablet Oral Daily  .  pantoprazole (PROTONIX) IV  40 mg Intravenous Q12H  . sodium chloride flush  10-40 mL Intracatheter Q12H  . sucralfate  1 g Oral TID WC & HS    Continuous Infusions: . ampicillin-sulbactam (UNASYN) IV 1.5 g (08/25/19 0546)  . thiamine injection 500 mg (08/25/19 0947)  . TPN ADULT (ION) 70 mL/hr at 08/25/19 0300  . TPN ADULT (ION)      PRN Meds: anti-nausea, HYDROmorphone (DILAUDID) injection, lidocaine (PF), lidocaine (PF), promethazine, sodium chloride flush  Physical Exam        Well developed but chronically ill appearing female, with continuous movement in bed, pulling off oxygen CV tachy resp no distress Abdomen slightly firm, nt  Vital Signs: BP (!) 131/99 (BP Location: Right Arm)   Pulse (!) 129   Temp (!) 97.5 F (36.4 C) (Axillary)   Resp (!) 22   Ht 5\' 6"  (1.676 m)   Wt 61.1 kg   LMP 02/22/2006 Comment: tubal ligation  SpO2 94%   BMI 21.74 kg/m  SpO2: SpO2: 94 % O2 Device: O2 Device: Nasal Cannula O2 Flow Rate: O2 Flow Rate (L/min): 3 L/min  Intake/output summary:   Intake/Output Summary (Last 24 hours) at 08/25/2019 1053 Last data filed at 08/25/2019 0941 Gross per 24 hour  Intake 3026.61 ml  Output 700 ml  Net 2326.61 ml   LBM: Last BM Date: 08/24/19 Baseline Weight: Weight: 52.2 kg Most recent weight: Weight: 61.1 kg       Palliative  Assessment/Data:20%    Flowsheet Rows     Most Recent Value  Intake Tab  Referral Department  -- [IMTS]  Unit at Time of Referral  Intermediate Care Unit  Palliative Care Primary Diagnosis  Pulmonary  Date Notified  08/23/19  Palliative Care Type  New Palliative care  Reason for referral  Clarify Goals of Care  Date of Admission  Aug 28, 2019  Date first seen by Palliative Care  08/23/19  # of days Palliative referral response time  0 Day(s)  # of days IP prior to Palliative referral  9  Clinical Assessment  Palliative Performance Scale Score  20%  Psychosocial & Spiritual Assessment  Palliative Care Outcomes  Patient/Family meeting held?  Yes  Who was at the meeting?  son  Palliative Care Outcomes  Clarified goals of care, Changed CPR status      Patient Active Problem List   Diagnosis Date Noted  . Aspiration into airway   . Dyspnea   . Adult failure to thrive   . Palliative care by specialist   . Goals of care, counseling/discussion   . DNR (do not resuscitate)   . Encephalopathy   . Generalized abdominal pain   . Gastritis and gastroduodenitis   . Acute esophagitis   . Hiatal hernia   . Colitis 08/15/2019  . Hematochezia 08/15/2019  . Ascites 08/15/2019  . Hypoalbuminemia   . Enteritis   . Pancreatic atrophy   . Nausea with vomiting 08/05/2019  . Urinary retention 06/12/2019  . Odynophagia 06/12/2019  . Anemia 06/12/2019  . Acute renal failure (ARF) ([place]) 06/02/2019  . High anion gap metabolic acidosis 53/61/4431  . Hyponatremia 06/02/2019  . Acute urinary retention   . Protein-calorie malnutrition, severe 07/21/2017  . Colonic mass 07/18/2017  . Dehydration 07/18/2017  . Hypomagnesemia 07/18/2017  . History of prolonged Q-T interval on ECG 07/06/2017  . Tachycardia   . Generalized anxiety disorder   . Debility 06/23/2017  . Constipation   . Quadriplegia and quadriparesis ([place])   .  Guillain Barr syndrome ([facility])   . Hypokalemia 06/21/2017  . Sinus  tachycardia 06/21/2017  . Essential hypertension 04/14/2017  . Paresthesia   . 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 Tobacco abuse 01/01/2017  . Abnormal LFTs 01/01/2017  . Depression   . COPD (chronic obstructive pulmonary disease) ([facility]) 06/16/2015    Palliative Care Plan    Recommendations/Plan:  Patient is awake and responsive, still with delirium, refusing treatments.  Primary team and Neuro are actively continuing work up and treatment.  (high dose thiamine)  Would recommend low dose Olanzapine for delirium if this in keeping with primary team's plan.  Olanzapine usually has positive results in about 48 hours if its going to work.  Aspiration precautions.  Son Sharia Reeve is the family contact and Management consultant.  If she were to rapidly decline he would likely choose comfort measures.  Goals of Care and Additional Recommendations:  Limitations on Scope of Treatment: Full Scope Treatment  Code Status:  DNR  Prognosis:   Unable to determine.  On her current trajectory of refractory delirium and severe malnutrition the patient's prognosis is very poor.   Discharge Planning:  To Be Determined  Care plan was discussed with RN  Thank you for allowing the Palliative Medicine Team to assist in the care of this patient.  Total time spent:  25 min.     Greater than 50%  of this time was spent counseling and coordinating care related to the above assessment and plan.  Norvel Richards, PA-C Palliative Medicine  Please contact Palliative MedicineTeam phone at [phone] for questions and concerns between 7 am - 7 pm.   Please see AMION for individual provider pager numbers.

## 2019-08-25 NOTE — Plan of Care (Signed)

## 2019-08-25 NOTE — Progress Notes (Signed)
2nd attempt at echo. Patient HR still remains too high. Will attempt once HR is under 120

## 2019-08-25 NOTE — Progress Notes (Signed)
Patient unable to complete the NIF and VC procedures. Patient is unable to follow commands and gave poor effort when the procedures were attempted.

## 2019-08-25 NOTE — Progress Notes (Signed)
Attempted NIF/ VC and nebulizer treatment but patient refused, using her hands to push away this Clinical research associate.  Pt does not appear in distress at this time.  This behavior was also reported from the night shift respiratory therapist.

## 2019-08-25 NOTE — Progress Notes (Signed)
Echo attempted ay 8:05 am Patient HR 130. Unable to attempt echo at this time.

## 2019-08-25 NOTE — Progress Notes (Signed)
Subjective: HD#10 Events Overnight: No events overnight  Patient was seen this morning on rounds. Patient was difficult to arouse, but would wake up with physical stimuli.   Objective:  Vital signs in last 24 hours: Vitals:   08/25/19 0550 08/25/19 0556 08/25/19 0700 08/25/19 0723  BP:   (!) 130/107   Pulse:   (!) 126 (!) 127  Resp: (!) 21 20 19 20   Temp:      TempSrc:      SpO2:   94% 97%  Weight:      Height:       Supplemental O2: 2-4L  Physical Exam: Physical Exam  Constitutional: She appears cachectic. She has a sickly appearance. She appears distressed.  HENT:  Head: Atraumatic.  Eyes: EOM are normal.  Cardiovascular: Tachycardia present.  Pulmonary/Chest: Tachypnea noted. She is in respiratory distress. She has wheezes. She has rales.  Abdominal: There is abdominal tenderness.  Musculoskeletal:        General: No edema. Normal range of motion.  Neurological: She is alert.  Skin: Skin is warm and dry.    Filed Weights   08/07/2019 2336 07/27/2019 1656  Weight: 52.2 kg 61.1 kg     Intake/Output Summary (Last 24 hours) at 08/25/2019 0737 Last data filed at 08/25/2019 0409 Gross per 24 hour  Intake 3006.61 ml  Output 700 ml  Net 2306.61 ml    Risk Score:  n/a  Pertinent labs/Imaging: CBC Latest Ref Rng & Units 08/25/2019 08/24/2019 08/23/2019  WBC 4.0 - 10.5 K/uL 9.2 9.6 7.3  Hemoglobin 12.0 - 15.0 g/dL 7.8(L) 7.9(L) 7.8(L)  Hematocrit 36.0 - 46.0 % 24.8(L) 25.6(L) 24.6(L)  Platelets 150 - 400 K/uL 183 183 153    CMP Latest Ref Rng & Units 08/25/2019 08/24/2019 08/23/2019  Glucose 70 - 99 mg/dL 10/23/2019) 709(G) 283(M)  BUN 6 - 20 mg/dL 20 15 10   Creatinine 0.44 - 1.00 mg/dL 629(U 7.65  Sodium 135 - 145 mmol/L 138 141 139  Potassium 3.5 - 5.1 mmol/L 4.6 4.3 4.3  Chloride 98 - 111 mmol/L 107 106 108  CO2 22 - 32 mmol/L 21(L) 25 24  Calcium 8.9 - 10.3 mg/dL 8.3(L) 8.2(L) 8.0(L)  Total Protein 6.5 - 8.1 g/dL 4.65) 0.35) 5.2(L)  Total Bilirubin 0.3 - 1.2 mg/dL  0.4 0.5 0.4  Alkaline Phos 38 - 126 U/L 146(H) 152(H) 135(H)  AST 15 - 41 U/L 22 25 24   ALT 0 - 44 U/L 21 21 15    Imaging:  CT scan (03/03): 1. Increased bilateral pleural effusions and anasarca. 2. New patchy areas of airspace opacity in the right middle and right lower lobes concerning for pneumonia. Clinical correlation is recommended. 3. Advanced fatty infiltration of the liver with findings of early cirrhosis. 4. Thickened appearance of loops of small bowel in the left hemiabdomen may be related to ascites and underlying liver disease. Enteritis is not excluded. Clinical correlation is recommended. No bowel obstruction. 5. Aortic Atherosclerosis  No results found.    Assessment/Plan:  Principal Problem:   Protein-calorie malnutrition, severe Active Problems:   Urinary retention   Nausea with vomiting   Colitis   Hematochezia   Ascites   Hypoalbuminemia   Enteritis   Generalized abdominal pain   Gastritis and gastroduodenitis   Acute esophagitis   Encephalopathy   Aspiration into airway   Dyspnea   Adult failure to thrive   Palliative care by specialist   Goals of care, counseling/discussion   DNR (do not resuscitate)  Patient Summary: Catherine Butler is a 59 y.o. with pertinent PMH of COPD, HTN, GBS, admit for enteritis complicated by ascites/anasarca and severe protein-caloric malnutrition on hospital day 10  #Hypoxemia with respiratory distress, multifactorial #Aspiration pneumonia vs. Pneumonitis  Considering patient's history of GBS and persistent bladder retention, weakness, and hyporeflexia, neurology consulted for possible recurrence. Considering her history of ETOH-use disorder, new diagnosis of cirrhosis, and significant protein-caloric malnutrition, it is reasonable to consider BeriBeri due to thiamine deficiency.  - She is on day 4 of IV antibiotics, Continue IV Unasyn for 5 days. - Supplemental oxygen - Trend NIF/VC q 12 hours, repeat ABG if  respiratory status worsens  - Thiamine level pending, echo pending for Beriberi work up - Continue thiamine supplementation   #COPD exacerbation - Continue methylprednisolone for 5 days, on day 4. 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 - Continue TPN - Continue phenergan, sucralfate, PPI  #Macrocytic anemia: - Stable today  Diet: TPN IVF: None,None VTE: SCDs Code: DNR PT/OT recs: None TOC recs: none   Dispo: Anticipated discharge pending clinical improvement.    Marianna Payment, D.O. [facility], PGY-1 Date 08/25/2019 Time 7:37 AM

## 2019-08-25 NOTE — Progress Notes (Signed)
Patient is unable to follow directions, cannot do the NIF.

## 2019-08-26 ENCOUNTER — Other Ambulatory Visit (HOSPITAL_COMMUNITY): Payer: Medicaid Other

## 2019-08-26 DIAGNOSIS — K2990 Gastroduodenitis, unspecified, without bleeding: Secondary | ICD-10-CM

## 2019-08-26 LAB — GLUCOSE, CAPILLARY
Glucose-Capillary: 144 mg/dL — ABNORMAL HIGH (ref 70–99)
Glucose-Capillary: 148 mg/dL — ABNORMAL HIGH (ref 70–99)
Glucose-Capillary: 193 mg/dL — ABNORMAL HIGH (ref 70–99)
Glucose-Capillary: 201 mg/dL — ABNORMAL HIGH (ref 70–99)

## 2019-08-26 LAB — COMPREHENSIVE METABOLIC PANEL
ALT: 23 U/L (ref 0–44)
AST: 24 U/L (ref 15–41)
Albumin: 1.2 g/dL — ABNORMAL LOW (ref 3.5–5.0)
Alkaline Phosphatase: 156 U/L — ABNORMAL HIGH (ref 38–126)
Anion gap: 9 (ref 5–15)
BUN: 26 mg/dL — ABNORMAL HIGH (ref 6–20)
CO2: 25 mmol/L (ref 22–32)
Calcium: 8.3 mg/dL — ABNORMAL LOW (ref 8.9–10.3)
Chloride: 107 mmol/L (ref 98–111)
Creatinine, Ser: 0.55 mg/dL (ref 0.44–1.00)
GFR calc Af Amer: >60 mL/min (ref >60)
GFR calc non Af Amer: >60 mL/min (ref >60)
Glucose, Bld: 148 mg/dL — ABNORMAL HIGH (ref 70–99)
Potassium: 4.6 mmol/L (ref 3.5–5.1)
Sodium: 141 mmol/L (ref 135–145)
Total Bilirubin: 0.3 mg/dL (ref 0.3–1.2)
Total Protein: 5.4 g/dL — ABNORMAL LOW (ref 6.5–8.1)

## 2019-08-26 LAB — CBC
HCT: 23.6 % — ABNORMAL LOW (ref 36.0–46.0)
Hemoglobin: 7.7 g/dL — ABNORMAL LOW (ref 12.0–15.0)
MCH: 36.3 pg — ABNORMAL HIGH (ref 26.0–34.0)
MCHC: 32.6 g/dL (ref 30.0–36.0)
MCV: 111.3 fL — ABNORMAL HIGH (ref 80.0–100.0)
Platelets: 204 10*3/uL (ref 150–400)
RBC: 2.12 MIL/uL — ABNORMAL LOW (ref 3.87–5.11)
RDW: 17.2 % — ABNORMAL HIGH (ref 11.5–15.5)
WBC: 9 10*3/uL (ref 4.0–10.5)
nRBC: 0 % (ref 0.0–0.2)

## 2019-08-26 LAB — HAPTOGLOBIN: Haptoglobin: 421 mg/dL — ABNORMAL HIGH (ref 33–346)

## 2019-08-26 LAB — PHOSPHORUS: Phosphorus: 4.1 mg/dL (ref 2.5–4.6)

## 2019-08-26 LAB — MAGNESIUM: Magnesium: 2 mg/dL (ref 1.7–2.4)

## 2019-08-26 MED ORDER — TRAVASOL 10 % IV SOLN
INTRAVENOUS | Status: AC
  Filled 2019-08-26: qty 940.8

## 2019-08-26 MED ORDER — METOPROLOL TARTRATE 5 MG/5ML IV SOLN: 2.5000 mg | INTRAVENOUS | Status: DC | PRN

## 2019-08-26 NOTE — Progress Notes (Signed)
Pt is refusing NIF/VC/nebs and to wear her oxygen. RN notified.

## 2019-08-26 NOTE — Progress Notes (Signed)
Daily Progress Note   Patient Name: Catherine Butler       Date: 08/26/2019 DOB: 1960/08/10  Age: 59 y.o. MRN#: [id number] Attending Physician: Reymundo Poll, MD Primary Care Physician: Grayce Sessions, NP Admit Date: 08/05/2019  Reason for Consultation/Follow-up: Establishing goals of care  Subjective: Patient awake, alert and oriented to self and place. No acute distress noted. Denies pain, endorses some shortness of breath. Wheezing audible. No family at the bedside. Lips dry. Attempted mouth care however patient refused other than use of wet wash cloth for moisture.   Attempted to reach son. Voicemail left.   Length of Stay: 11  Current Medications: Scheduled Meds:  . aspirin EC  81 mg Oral Daily  . bisacodyl  10 mg Rectal Daily  . Chlorhexidine Gluconate Cloth  6 each Topical Daily  . feeding supplement (ENSURE ENLIVE)  237 mL Oral TID BM  . insulin aspart  0-9 Units Subcutaneous Q6H  . levalbuterol  0.63 mg Nebulization BID  . mouth rinse  15 mL Mouth Rinse BID  . multivitamin with minerals  1 tablet Oral Daily  . pantoprazole (PROTONIX) IV  40 mg Intravenous Q12H  . sodium chloride flush  10-40 mL Intracatheter Q12H  . sucralfate  1 g Oral TID WC & HS    Continuous Infusions: . ampicillin-sulbactam (UNASYN) IV 1.5 g (08/26/19 0656)  . thiamine injection 500 mg (08/25/19 2347)  . TPN ADULT (ION) 70 mL/hr at 08/25/19 1900  . TPN ADULT (ION)      PRN Meds: anti-nausea, HYDROmorphone (DILAUDID) injection, lidocaine (PF), lidocaine (PF), promethazine, sodium chloride flush  Physical Exam     -awake, alert to self and place, ill-appearing, cachectic -Tachycardic -diminished bilaterally, audible wheeze -Will follow some commands      Vital Signs: BP (!) 125/95 (BP  Location: Left Leg)   Pulse (!) 129   Temp 97.8 F (36.6 C) (Oral)   Resp 20   Ht 5\' 6"  (1.676 m)   Wt 61.1 kg   LMP 02/22/2006 Comment: tubal ligation  SpO2 100%   BMI 21.74 kg/m  SpO2: SpO2: 100 % O2 Device: O2 Device: Nasal Cannula O2 Flow Rate: O2 Flow Rate (L/min): 2 L/min  Intake/output summary:   Intake/Output Summary (Last 24 hours) at 08/26/2019 1124 Last data filed at 08/26/2019 0300  Gross per 24 hour  Intake 707.27 ml  Output 1100 ml  Net -392.73 ml   LBM: Last BM Date: 08/24/19 Baseline Weight: Weight: 52.2 kg Most recent weight: Weight: 61.1 kg       Palliative Assessment/Data:    Flowsheet Rows     Most Recent Value  Intake Tab  Referral Department  -- [IMTS]  Unit at Time of Referral  Intermediate Care Unit  Palliative Care Primary Diagnosis  Pulmonary  Date Notified  08/23/19  Palliative Care Type  New Palliative care  Reason for referral  Clarify Goals of Care  Date of Admission  07/26/2019  Date first seen by Palliative Care  08/23/19  # of days Palliative referral response time  0 Day(s)  # of days IP prior to Palliative referral  9  Clinical Assessment  Palliative Performance Scale Score  20%  Psychosocial & Spiritual Assessment  Palliative Care Outcomes  Patient/Family meeting held?  Yes  Who was at the meeting?  son  Palliative Care Outcomes  Clarified goals of care, Changed CPR status      Patient Active Problem List   Diagnosis Date Noted  . Palliative care encounter   . Aspiration into airway   . Dyspnea   . Adult failure to thrive   . Palliative care by specialist   . Goals of care, counseling/discussion   . DNR (do not resuscitate)   . Encephalopathy   . Generalized abdominal pain   . Gastritis and gastroduodenitis   . Acute esophagitis   . Hiatal hernia   . Colitis 08/15/2019  . Hematochezia 08/15/2019  . Ascites 08/15/2019  . Hypoalbuminemia   . Enteritis   . Pancreatic atrophy   . Nausea with vomiting 08/05/2019  .  Urinary retention 06/12/2019  . Odynophagia 06/12/2019  . Anemia 06/12/2019  . Acute renal failure (ARF) (HCC) 06/02/2019  . High anion gap metabolic acidosis 06/02/2019  . Hyponatremia 06/02/2019  . Acute urinary retention   . Protein-calorie malnutrition, severe 07/21/2017  . Colonic mass 07/18/2017  . Dehydration 07/18/2017  . Hypomagnesemia 07/18/2017  . History of prolonged Q-T interval on ECG 07/06/2017  . Tachycardia   . Generalized anxiety disorder   . Debility 06/23/2017  . Constipation   . Quadriplegia and quadriparesis (HCC)   . Guillain Barr syndrome (HCC)   . Hypokalemia 06/21/2017  . Sinus tachycardia 06/21/2017  . Essential hypertension 04/14/2017  . Paresthesia   . 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 Tobacco abuse 01/01/2017  . Abnormal LFTs 01/01/2017  . Depression   . COPD (chronic obstructive pulmonary disease) (HCC) 06/16/2015    Palliative Care Assessment & Plan   Recommendations/Plan:  Continue current plan of care per medical team. Somewhat improved per notations.   Attempted to reach son. Voicemail left.   PMT will continue to support and follow  Goals of Care and Additional Recommendations:  Limitations on Scope of Treatment: Full Scope Treatment  Code Status:    Code Status Orders  (From admission, onward)         Start     Ordered   08/23/19 1701  Do not attempt resuscitation (DNR)  Continuous    Question Answer Comment  In the event of cardiac or respiratory ARREST Do not call a "code blue"   In the event of cardiac or respiratory ARREST Do not perform Intubation, CPR, defibrillation or ACLS   In the event of cardiac or respiratory ARREST Use medication  by any route, position, wound care, and other measures to relive pain and suffering. May use oxygen, suction and manual treatment of airway obstruction as needed for comfort.      08/23/19 1701        Code Status History    Date Active  Date Inactive Code Status Order ID Comments User Context   08/15/2019 0415 08/23/2019 1701 Full Code [id number]  Marty Heck, DO ED   08/05/2019 1922 08/10/2019 2007 Full Code [id number]  Lars Mage, MD ED   06/02/2019 0211 06/04/2019 2011 Full Code [id number]  Delice Bison, DO ED   06/01/2018 0048 06/02/2018 1429 Full Code [id number]  Ivor Costa, MD ED   04/10/2018 1247 04/12/2018 1656 Full Code [id number]  Caren Griffins, MD Inpatient   07/18/2017 1937 07/21/2017 1607 Full Code [id number]  Eugenie Filler, MD Inpatient   06/23/2017 1520 07/06/2017 1423 Full Code [id number]  Bary Leriche, PA-C Inpatient   06/21/2017 0124 06/23/2017 1516 Full Code [id number]  Roney Jaffe, MD Inpatient   04/11/2017 1757 04/20/2017 1803 Full Code [id number]  Merton Border, MD ED   01/01/2017 2101 01/03/2017 1825 Full Code [id number]  Ivor Costa, MD ED   06/16/2015 1934 06/18/2015 2038 Full Code [id number]  Doree Albee, MD ED   02/08/2013 1142 02/09/2013 1456 Full Code [id number]  Hyman Bible, PA-C ED   09/13/2012 0222 09/13/2012 1048 Full Code [id number]  Ezequiel Essex, MD ED   Advance Care Planning Activity       Prognosis:   Guarded  Discharge Planning:  [place] for rehab with Palliative care service follow-up  Care plan was discussed with RN.   Thank you for allowing the Palliative Medicine Team to assist in the care of this patient.  Time Total: 25 min.   Visit consisted of counseling and education dealing with the complex and emotionally intense issues of symptom management and palliative care in the setting of serious and potentially life-threatening illness.Greater than 50%  of this time was spent counseling and coordinating care related to the above assessment and plan.  Alda Lea, AGPCNP-BC  Palliative Medicine Team [phone]   Please contact Palliative Medicine Team phone at [phone] for questions and concerns.

## 2019-08-26 NOTE — Progress Notes (Signed)
PHARMACY - TOTAL PARENTERAL NUTRITION CONSULT NOTE   Indication: Severe esophagitis   Patient Measurements: Height: '5\' 6"'$  (167.6 cm) Weight: 134 lb 11.2 oz (61.1 kg) IBW/kg (Calculated) : 59.3 TPN AdjBW (KG): 52.2 Body mass index is 21.74 kg/m. Usual Weight: 61 kg  Assessment: 59 year old female with PMH significant for COPD, HTN, Guillan Barre, quadriplegia, ETOH abuse, and recent admission for colitis now presenting with ascites and colitis on 08/12/2019. Patient has not been eating well since discharge a week prior and feeling that food has been getting stuck in her throat leading to vomiting. Patient has severe muscle depletion and poor po intake due to loss of appetite for >1 month.   Glucose / Insulin: CBG controlled (144-190). 6 units of sSSI in last 24 hours. 3/4 Started om Methylpred 40 IV qday Electrolytes: K 4.6, Mg 2, Phos 4.1. CoCa 10.4- reduced in TPN and monitor. Others within normal limits.  Renal: SCr stable at 0.55 (monitoring closely with quadriplegia), BUN 26 (trend up) LFTs / TGs: Elevated Alk Phos (156). TG elevated at 208. Others within normal limits. Prealbumin / albumin: Pre-albumin 6.4 >>5.3>> less than 5; Alb 1.1 >> 1>>1.2.  Intake / Output; MIVF: UOP 0.8 mL/kg/hr, LBM 3/1. Anasarca noted on CT scan.  GI Imaging: 08/15/19 CT Abdomen - moderate bilateral pleural effusions, esophagitis and hiatal hernia, large volume ascites 08/22/19 CT Abdomen - PNA + increased bilateral effusions and anasarca, advanced fatty infiltration of liver suggestive of early cirrhosis, thickened small bowel loops from ascites and underlying liver disease- cannot exclude enteritis, no bowel obstruction Surgeries / Procedures:  08/15/19 - L paracentesis with 900 mL drained 07/29/2019 - EGD: severe esophagitis, 7 cm hiatal hernia, portal hypertensive gastropathy, moderate gastritis, two duodenal polyps which were resected.   Central access: PICC, 08/17/19 TPN start date: 08/17/19  Nutritional Goals  (per RD recommendation on 08/22/19): KCal: 1600-1800, Protein: 80-95, Fluid: >= 1.6 L/ day Goal TPN rate is 70 mL/hr (provides 94 g of protein and 1737 kcals per day)  Current Nutrition:  Dys 3 diet - abdominal pain  Ensure Enlive TID - 2 charted  Plan:  Continue TPN at goal rate of 70 mL/hr. Electrolytes in TPN: 26mq/L of Na, 649m/L of K, reduce to 2.44m64mL of Ca, 144m244m of Mg, and 144mm27m of Phos. Cl:Ac ratio 1:1 Continue MVI/TE as oral for now. Add Thiamine back to the TPN on 3/10 when high dose therapy complete and continue folic acid in TPN.  Continue Sensitive q6h SSI and adjust as needed  Monitor labs  Follow-up ability to take po intake versus Cortrak placement    CathyAlanda SlimrmD, FCCM [facility] Hospital[facility] Pharmacist Please see AMION for all Pharmacists' Contact Phone Numbers 08/26/2019, 8:56 AM

## 2019-08-26 NOTE — Progress Notes (Addendum)
Subjective: HD#11 Events Overnight: No events overnight.  Patient was seen this morning on rounds. Patient resting comfortably in bed. She appears more alert and comfortable then yesterday. She states that she is still short of breath and has abdominal pain. Overall she appears to be improving.  Objective:  Vital signs in last 24 hours: Vitals:   08/25/19 1932 08/25/19 2300 08/26/19 0342 08/26/19 0412  BP:  (!) 121/93 (!) 122/97 (!) 125/95  Pulse:  (!) 129 (!) 131   Resp:  19 (!) 28 20  Temp:  (!) 97.2 F (36.2 C) 98.4 F (36.9 C)   TempSrc:  Axillary Axillary   SpO2: 100%  100%   Weight:      Height:      Supplemental O2: 3L  Physical Exam: Physical Exam  Constitutional: She appears cachectic. No distress.  HENT:  Head: Normocephalic and atraumatic.  Eyes: EOM are normal.  Cardiovascular: Tachycardia present.  Pulmonary/Chest: No respiratory distress. She has rales.  Abdominal: She exhibits no distension. There is abdominal tenderness.  Musculoskeletal:        General: No edema (improved). Normal range of motion.     Cervical back: Normal range of motion.  Neurological: She is alert.  Skin: Skin is warm and dry. She is not diaphoretic.    Filed Weights   08/18/2019 2336 August 20, 2019 1656  Weight: 52.2 kg 61.1 kg     Intake/Output Summary (Last 24 hours) at 08/26/2019 0657 Last data filed at 08/26/2019 0300 Gross per 24 hour  Intake 1447.2 ml  Output 1100 ml  Net 347.2 ml    Risk Score:  n/a  Pertinent labs/Imaging: CBC Latest Ref Rng & Units 08/26/2019 08/25/2019 08/24/2019  WBC 4.0 - 10.5 K/uL 9.0 9.2 9.6  Hemoglobin 12.0 - 15.0 g/dL 7.7(L) 7.8(L) 7.9(L)  Hematocrit 36.0 - 46.0 % 23.6(L) 24.8(L) 25.6(L)  Platelets 150 - 400 K/uL 204 183 183    CMP Latest Ref Rng & Units 08/25/2019 08/24/2019 08/23/2019  Glucose 70 - 99 mg/dL 141(H) 126(H) 156(H)  BUN 6 - 20 mg/dL 20 15 10   Creatinine 0.44 - 1.00 mg/dL 0.45 0.44 0.44  Sodium 135 - 145 mmol/L 138 141 139  Potassium  3.5 - 5.1 mmol/L 4.6 4.3 4.3  Chloride 98 - 111 mmol/L 107 106 108  CO2 22 - 32 mmol/L 21(L) 25 24  Calcium 8.9 - 10.3 mg/dL 8.3(L) 8.2(L) 8.0(L)  Total Protein 6.5 - 8.1 g/dL 5.6(L) 5.4(L) 5.2(L)  Total Bilirubin 0.3 - 1.2 mg/dL 0.4 0.5 0.4  Alkaline Phos 38 - 126 U/L 146(H) 152(H) 135(H)  AST 15 - 41 U/L 22 25 24   ALT 0 - 44 U/L 21 21 15     No results found.    Assessment/Plan:  Principal Problem:   Protein-calorie malnutrition, severe Active Problems:   Urinary retention   Nausea with vomiting   Colitis   Hematochezia   Ascites   Hypoalbuminemia   Enteritis   Generalized abdominal pain   Gastritis and gastroduodenitis   Acute esophagitis   Encephalopathy   Aspiration into airway   Dyspnea   Adult failure to thrive   Palliative care by specialist   Goals of care, counseling/discussion   DNR (do not resuscitate)   Palliative care encounter    Patient Summary: Catherine Butler is a 59 y.o. 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 Pneumonitis  Patient clinically improving after starting thiamine supplementations. It has been difficult to get an ECHO due to persistent tachycardia, but hesitant to treat the HR due to concern for high-output HF. We will continue B1 sup and monitor her for clinical improvement.   - D/c antibiotics today.  Patient completed a 5-day course of Unasyn -Continue supplemental oxygen as needed -Continue to trend NIF/PICC every 12 hours and repeat ABG if respiratory status worsens  -Continue to supplement thiamine -Thiamine level pending and will try for echo when heart rate improves.  #COPD exacerbation - Continue methylprednisolone on day 5. 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 - Continue TPN - Continue phenergan, sucralfate, PPI  #Macrocytic anemia: - Stable today  Diet: TPN IVF: None,None VTE: SCDs Code: DNR PT/OT recs: Pending TOC recs: pending    Dispo: Anticipated discharge pending improvement.    Dellia Cloud, D.O. [facility], PGY-1 Date 08/26/2019 Time 6:57 AM

## 2019-08-27 ENCOUNTER — Encounter: Payer: Self-pay | Admitting: Gastroenterology

## 2019-08-27 ENCOUNTER — Other Ambulatory Visit (HOSPITAL_COMMUNITY): Payer: Medicaid Other

## 2019-08-27 DIAGNOSIS — K921 Melena: Secondary | ICD-10-CM

## 2019-08-27 DIAGNOSIS — Z9889 Other specified postprocedural states: Secondary | ICD-10-CM

## 2019-08-27 LAB — CBC
HCT: 23.2 % — ABNORMAL LOW (ref 36.0–46.0)
Hemoglobin: 7 g/dL — ABNORMAL LOW (ref 12.0–15.0)
MCH: 32.9 pg (ref 26.0–34.0)
MCHC: 30.2 g/dL (ref 30.0–36.0)
MCV: 108.9 fL — ABNORMAL HIGH (ref 80.0–100.0)
Platelets: 209 10*3/uL (ref 150–400)
RBC: 2.13 MIL/uL — ABNORMAL LOW (ref 3.87–5.11)
RDW: 16.7 % — ABNORMAL HIGH (ref 11.5–15.5)
WBC: 10.9 10*3/uL — ABNORMAL HIGH (ref 4.0–10.5)
nRBC: 0 % (ref 0.0–0.2)

## 2019-08-27 LAB — COMPREHENSIVE METABOLIC PANEL
ALT: 27 U/L (ref 0–44)
AST: 27 U/L (ref 15–41)
Albumin: 1.4 g/dL — ABNORMAL LOW (ref 3.5–5.0)
Alkaline Phosphatase: 145 U/L — ABNORMAL HIGH (ref 38–126)
Anion gap: 9 (ref 5–15)
BUN: 32 mg/dL — ABNORMAL HIGH (ref 6–20)
CO2: 23 mmol/L (ref 22–32)
Calcium: 8.4 mg/dL — ABNORMAL LOW (ref 8.9–10.3)
Chloride: 109 mmol/L (ref 98–111)
Creatinine, Ser: 0.56 mg/dL (ref 0.44–1.00)
GFR calc Af Amer: >60 mL/min (ref >60)
GFR calc non Af Amer: >60 mL/min (ref >60)
Glucose, Bld: 170 mg/dL — ABNORMAL HIGH (ref 70–99)
Potassium: 4.7 mmol/L (ref 3.5–5.1)
Sodium: 141 mmol/L (ref 135–145)
Total Bilirubin: 0.4 mg/dL (ref 0.3–1.2)
Total Protein: 5.6 g/dL — ABNORMAL LOW (ref 6.5–8.1)

## 2019-08-27 LAB — TRIGLYCERIDES: Triglycerides: 165 mg/dL — ABNORMAL HIGH (ref <150)

## 2019-08-27 LAB — PHOSPHORUS: Phosphorus: 4.4 mg/dL (ref 2.5–4.6)

## 2019-08-27 LAB — GLUCOSE, CAPILLARY
Glucose-Capillary: 153 mg/dL — ABNORMAL HIGH (ref 70–99)
Glucose-Capillary: 155 mg/dL — ABNORMAL HIGH (ref 70–99)
Glucose-Capillary: 158 mg/dL — ABNORMAL HIGH (ref 70–99)
Glucose-Capillary: 150 mg/dL — ABNORMAL HIGH (ref 70–99)

## 2019-08-27 LAB — DIFFERENTIAL
Abs Immature Granulocytes: 0.08 10*3/uL — ABNORMAL HIGH (ref 0.00–0.07)
Basophils Absolute: 0 10*3/uL (ref 0.0–0.1)
Basophils Relative: 0 %
Eosinophils Absolute: 0 10*3/uL (ref 0.0–0.5)
Eosinophils Relative: 0 %
Immature Granulocytes: 1 %
Lymphocytes Relative: 12 %
Lymphs Abs: 1.3 10*3/uL (ref 0.7–4.0)
Monocytes Absolute: 1.1 10*3/uL — ABNORMAL HIGH (ref 0.1–1.0)
Monocytes Relative: 10 %
Neutro Abs: 8.4 10*3/uL — ABNORMAL HIGH (ref 1.7–7.7)
Neutrophils Relative %: 77 %

## 2019-08-27 LAB — MAGNESIUM: Magnesium: 2 mg/dL (ref 1.7–2.4)

## 2019-08-27 LAB — PREALBUMIN: Prealbumin: 11.6 mg/dL — ABNORMAL LOW (ref 18–38)

## 2019-08-27 MED ORDER — TRAVASOL 10 % IV SOLN
INTRAVENOUS | Status: AC
  Filled 2019-08-27: qty 940.8

## 2019-08-27 NOTE — Progress Notes (Signed)
RN has attempted to administer morning medication with apple sauce.  Pt unable to swallow and spits back out. Patient was able to swallow 2 spoons of apple sauce with medications. Patient is alert to self. Pt is uncooperative and will not allow RN to perform mouth care. RN will continue to monitor.

## 2019-08-27 NOTE — Progress Notes (Signed)
PHARMACY - TOTAL PARENTERAL NUTRITION CONSULT NOTE   Indication: Severe esophagitis   Patient Measurements: Height: '5\' 6"'$  (167.6 cm) Weight: 134 lb 11.2 oz (61.1 kg) IBW/kg (Calculated) : 59.3 TPN AdjBW (KG): 52.2 Body mass index is 21.74 kg/m. Usual Weight: 61 kg  Assessment: 59 year old female with PMH significant for COPD, HTN, Guillan Barre, quadriplegia, ETOH abuse, and recent admission for colitis now presenting with ascites and colitis on 07/28/2019. Patient has not been eating well since discharge a week prior and feeling that food has been getting stuck in her throat leading to vomiting. Patient has severe muscle depletion and poor po intake due to loss of appetite for >1 month.   Glucose / Insulin: CBG controlled (144-190). 6 units of sSSI in last 24 hours. 3/4 Started om Methylpred 40 IV qday Electrolytes: K 4.7 (rising), Mg 2, Phos 4.1. CoCa 10.5- will remove from TPN. Others within normal limits.  Renal: SCr stable at 0.55 (monitoring closely with quadriplegia), BUN 26 (trend up) LFTs / TGs: Elevated Alk Phos- trending down. TG 165. Others within normal limits. Prealbumin / albumin: Pre-albumin 6.4 >>5.3>> less than 5 (not collected 3/8); Alb 1.4 (up).  Intake / Output; MIVF: UOP 0.3 mL/kg/hr (decreased), LBM 3/5. Anasarca noted on CT scan.  GI Imaging: 08/15/19 CT Abdomen - moderate bilateral pleural effusions, esophagitis and hiatal hernia, large volume ascites 08/22/19 CT Abdomen - PNA + increased bilateral effusions and anasarca, advanced fatty infiltration of liver suggestive of early cirrhosis, thickened small bowel loops from ascites and underlying liver disease- cannot exclude enteritis, no bowel obstruction Surgeries / Procedures:  08/15/19 - L paracentesis with 900 mL drained 08/15/2019 - EGD: severe esophagitis, 7 cm hiatal hernia, portal hypertensive gastropathy, moderate gastritis, two duodenal polyps which were resected.   Central access: PICC, 08/17/19 TPN start date:  08/17/19  Nutritional Goals (per RD recommendation on 08/22/19): KCal: 1600-1800, Protein: 80-95, Fluid: >= 1.6 L/ day Goal TPN rate is 70 mL/hr (provides 94 g of protein and 1737 kcals per day)  Current Nutrition:  Dys 3 diet - 0% intake, continued abdominal pain, poor appetite Ensure Enlive TID - 1 charted  Plan:  Continue TPN at goal rate of 70 mL/hr. Electrolytes in TPN: 32mq/L of Na, reduce to 580m/L of K, remove Ca, 1554mL of Mg, and reduce back to 74m29mL of Phos. Cl:Ac ratio 1:1 Continue MVI/TE as oral for now. Add Thiamine back to the TPN on 3/10 when high dose therapy complete and continue folic acid in TPN.  Continue Sensitive q6h SSI and adjust as needed  Monitor labs  Follow-up ability to take po intake versus Cortrak placement    JessSloan LeiterarmD, BCPS, BCCCP Clinical Pharmacist Clinical phone 08/27/2019 until 3PM- #[phone] refer to [facility] 08/27/2019, 7:34 AM

## 2019-08-27 NOTE — Progress Notes (Signed)
Speech Language Pathology Treatment: Dysphagia  Patient Details Name: Catherine Butler MRN: [id number] DOB: [birth year] Today's Date: 08/27/2019 Time: 1019-1030 SLP Time Calculation (min) (ACUTE ONLY): 11 min  Assessment / Plan / Recommendation Clinical Impression  Pt was seen for skilled ST targeting dysphagia tx.  She was encountered lethargic but agitated and lying laterally across the bed.  Bilateral mitts were in place.  She was repositioned and HOB was elevated prior to PO trials.  Pt's labial surfaces were coated with what appeared to be dried blood.  RN reported that pt had decreased mentation this AM.  Pt was seen with trials of thin liquid via straw sip and puree.  She was initially unable to draw liquid from the straw; however, following a siphoned straw sip trial, pt was able to independently use the straw for the remainder of this tx session.  She exhibited eructation x4 and a delayed cough following 1/4 trials of water via straw sip.  Suspect esophageal dysphagia and that the pt is at an elevated risk for post prandial aspiration.  No overt s/sx of aspiration were observed with additional thin liquid or puree trials. She exhibited good bolus acceptance of puree trials via spoon given verbal cues; however, oral transit was prolonged and min oral residue was observed following all puree trials.  Attempted a Dysphagia 3 (soft) solid trial from the pt's meal tray; however, she immediately expelled the trial from her oral cavity and refused additional trials despite encouragement. Pt may benefit from a instrumental swallow study to further evaluate swallow function, but she is not appropriate at this time secondary to agitation and decreased ability to follow commands.  Due to current mentation and clinical presentation, recommend diet downgrade to Dysphagia 1 (puree) solids with thin liquids and medications administered crushed in puree.  Pt will require full assistance and supervision for feeding and  to cue for the following compensatory strategies: 1) Small bites/sips 2) Slow rate of intake 3) Sit upright as possible 4) Remain upright for 30+ minutes after a meal 4) Limit distractions.  SLP will f/u per POC.    HPI HPI: 59 y.o. female  with past medical history of COPD, guillain-barre/AIDP w/ chronic weakness, HTN, and ETOH use admitted on 08/09/2019 with abdominal pain, nausea, and vomiting. She was found to be severely dehydrated with multiple severe electrolyte abnormalities. Being treated for pneumonitis vs aspiration pna and COPD exacerbation. Patient  ETOH use with significant esophagitis/gastritis.  Palliative following for GOC. Patient known to [place], participated in [place] 08/10/19: grossly normal swallow function. However, MBSS revealed esophageal retention and assessment of the esophageal phase was recommended.  CXR 08/22/19: Stable diffuse right lung opacities concerning for pneumonia.      SLP Plan  Continue with current plan of care       Recommendations  Diet recommendations: Dysphagia 1 (puree);Thin liquid Liquids provided via: Cup;Straw Medication Administration: Crushed with puree Supervision: Staff to assist with self feeding;Full supervision/cueing for compensatory strategies Compensations: Minimize environmental distractions;Slow rate;Small sips/bites;Follow solids with liquid Postural Changes and/or Swallow Maneuvers: Seated upright 90 degrees;Upright 30-60 min after meal                Oral Care Recommendations: Oral care QID;Staff/trained caregiver to provide oral care Follow up Recommendations: Skilled Nursing facility SLP Visit Diagnosis: Dysphagia, unspecified (R13.10) Plan: Continue with current plan of care       GO               Colin Mulders.,  M.S., CCC-SLP Acute Rehabilitation Services Office: [phone]  Shanon Rosser [facility] 08/27/2019, 10:36 AM

## 2019-08-27 NOTE — TOC Progression Note (Signed)
Transition of Care [facility]) - Progression Note    Patient Details  Name: EVALIN SHAWHAN MRN: [id number] Date of Birth: [birth year]  [facility]) CM/SW Contact  Gildardo Griffes, [place] Phone Number: 08/27/2019, 10:24 AM  Clinical Narrative:     CSW followed up with patient's son Sharia Reeve regarding bed offers, he states he has chosen [facility] in [place].   CSW reached out to facility to confirm bed. Will need updated COVID test prior to discharge. Please inform CSW when patient nearing medical readiness to dc to [facility].   Expected Discharge Plan: Skilled Nursing Facility Barriers to Discharge: Continued Medical Work up  Expected Discharge Plan and Services Expected Discharge Plan: Skilled Nursing Facility In-house Referral: NA Discharge Planning Services: CM Consult Post Acute Care Choice: Skilled Nursing Facility Living arrangements for the past 2 months: Single Family Home                 DME Arranged: (NA)         HH Arranged: NA           Social Determinants of Health (SDOH) Interventions    Readmission Risk Interventions Readmission Risk Prevention Plan 08/17/2019 08/17/2019  Transportation Screening Complete Complete  PCP or Specialist Appt within 3-5 Days Complete -  HRI or Home Care Consult (No Data) -  Social Work Consult for Recovery Care Planning/Counseling - Complete  Palliative Care Screening Not Applicable Not Applicable  Medication Review Ocean[employer]) Complete Complete  Some recent data might be hidden

## 2019-08-27 NOTE — Progress Notes (Signed)
Subjective: HD#12 Events Overnight: No events overnight  Patient was seen this morning on rounds.  Still clinically improved but admits to abdominal pain as of breath.  Patient is alert to self and place.  Objective:  Vital signs in last 24 hours: Vitals:   08/26/19 2308 08/27/19 0137 08/27/19 0300 08/27/19 0509  BP:   (!) 138/91   Pulse: (!) 126  (!) 122   Resp: (!) 21 20 (!) 24 17  Temp:   98 F (36.7 C)   TempSrc:   Oral   SpO2: 95%  95%   Weight:      Height:       Supplemental O2: 3 L   Physical Exam: Physical Exam  Filed Weights   08/04/2019 2336 08-25-2019 1656  Weight: 52.2 kg 61.1 kg     Intake/Output Summary (Last 24 hours) at 08/27/2019 0536 Last data filed at 08/26/2019 1355 Gross per 24 hour  Intake 349.23 ml  Output 425 ml  Net -75.77 ml    Risk Score:  N/A  Pertinent labs/Imaging: CBC Latest Ref Rng & Units 08/26/2019 08/25/2019 08/24/2019  WBC 4.0 - 10.5 K/uL 9.0 9.2 9.6  Hemoglobin 12.0 - 15.0 g/dL 7.7(L) 7.8(L) 7.9(L)  Hematocrit 36.0 - 46.0 % 23.6(L) 24.8(L) 25.6(L)  Platelets 150 - 400 K/uL 204 183 183    CMP Latest Ref Rng & Units 08/26/2019 08/25/2019 08/24/2019  Glucose 70 - 99 mg/dL 096(K) 383(K) 184(C)  BUN 6 - 20 mg/dL 37(V) 20 15  Creatinine 0.44 - 1.00 mg/dL 4.36 0.67 7.03  Sodium 135 - 145 mmol/L 141 138 141  Potassium 3.5 - 5.1 mmol/L 4.6 4.6 4.3  Chloride 98 - 111 mmol/L 107 107 106  CO2 22 - 32 mmol/L 25 21(L) 25  Calcium 8.9 - 10.3 mg/dL 8.3(L) 8.3(L) 8.2(L)  Total Protein 6.5 - 8.1 g/dL 4.0(B) 5.6(L) 5.4(L)  Total Bilirubin 0.3 - 1.2 mg/dL 0.3 0.4 0.5  Alkaline Phos 38 - 126 U/L 156(H) 146(H) 152(H)  AST 15 - 41 U/L 24 22 25   ALT 0 - 44 U/L 23 21 21     No results found.    Assessment/Plan:  Principal Problem:   Protein-calorie malnutrition, severe Active Problems:   Urinary retention   Nausea with vomiting   Colitis   Hematochezia   Ascites   Hypoalbuminemia   Enteritis   Generalized abdominal pain   Gastritis and  gastroduodenitis   Acute esophagitis   Encephalopathy   Aspiration into airway   Dyspnea   Adult failure to thrive   Palliative care by specialist   Goals of care, counseling/discussion   DNR (do not resuscitate)   Palliative care encounter    Patient Summary: Catherine Butler is a 59 y.o. with pertinent PMH of COPD, hypertension who presented with nausea and vomiting and admit for enteritis complicated by ascites/anasarca and significant protein-caloric malnutrition with concern for wet beriberi on hospital day 12  #Hypoxemia with respiratory distress, multifactorial #Aspiration pneumonia vs. Pneumonitis  Patient continue to improve. We will continue thiamine supplementation and wait for B1 level.  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  Marianna Payment, D.O. [facility], PGY-1 Date 08/27/2019 Time 5:36 AM

## 2019-08-27 NOTE — Progress Notes (Signed)
Physical Therapy Treatment Patient Details Name: Catherine Butler MRN: [id number] DOB: 08/22/1960 Today's Date: 08/27/2019    History of Present Illness 59 year old female with PMH for COPD, HTN, AIDP, recent hospital admission for colitis (2/14-2/19) who presented on 08/12/2019 with abdominal pain, nausea, and vomiting.    PT Comments    Patient continues to be confused and restless. Tolerated sitting EOB x2 with Mod A for support. Returning to supine without warning despite needing to take meds. Pt follows 1 step commands inconsistently. Attempting to pull off mitts. Refusing further medication and mobility. HR tachycardic in 120s. Deferred OOB mobility due to safety concerns, cognition and cooperation. Will follow.   Follow Up Recommendations  SNF;Supervision/Assistance - 24 hour     Equipment Recommendations  Other (comment)(defer to next venue)    Recommendations for Other Services       Precautions / Restrictions Precautions Precautions: Fall Precaution Comments: watch HR Restrictions Weight Bearing Restrictions: No    Mobility  Bed Mobility Overal bed mobility: Needs Assistance Bed Mobility: Supine to Sit;Rolling Rolling: Mod assist   Supine to sit: HOB elevated;Mod assist     General bed mobility comments: Performed supine to sit x2, assist needed for trunk and LEs. Pt throwing self back into bed despite cues to stay upright. Attempting to help pt take meds bys itting up.  Transfers                 General transfer comment: Declined any further mobility.  Ambulation/Gait                 Stairs             Wheelchair Mobility    Modified Rankin (Stroke Patients Only)       Balance Overall balance assessment: Needs assistance Sitting-balance support: Feet unsupported;Bilateral upper extremity supported Sitting balance-Leahy Scale: Fair Sitting balance - Comments: Min guard-Min A sitting up; poor sitting tolerance.                                     Cognition Arousal/Alertness: Awake/alert Behavior During Therapy: Restless Overall Cognitive Status: Impaired/Different from baseline Area of Impairment: Orientation;Attention;Memory;Safety/judgement;Following commands;Awareness;Problem solving                 Orientation Level: Disoriented to;Situation Current Attention Level: Sustained Memory: Decreased recall of precautions;Decreased short-term memory Following Commands: Follows one step commands inconsistently Safety/Judgement: Decreased awareness of safety;Decreased awareness of deficits Awareness: Intellectual Problem Solving: Slow processing;Requires verbal cues General Comments: Pt restless writhing around in the bed; impaired attention. Follows commands inconsistently. Easily distracted. Attempting to remove mitts.      Exercises      General Comments General comments (skin integrity, edema, etc.): HR in 120s during session.      Pertinent Vitals/Pain Pain Assessment: Faces Faces Pain Scale: Hurts little more Pain Location: generalized  Pain Descriptors / Indicators: Discomfort Pain Intervention(s): Repositioned;Monitored during session;Limited activity within patient's tolerance    Home Living                      Prior Function            PT Goals (current goals can now be found in the care plan section) Progress towards PT goals: Not progressing toward goals - comment(not cooperating, confused)    Frequency    Min 2X/week      PT Plan Current plan  remains appropriate    Co-evaluation              AM-PAC PT "6 Clicks" Mobility   Outcome Measure  Help needed turning from your back to your side while in a flat bed without using bedrails?: A Lot Help needed moving from lying on your back to sitting on the side of a flat bed without using bedrails?: A Lot Help needed moving to and from a bed to a chair (including a wheelchair)?: A Lot Help needed  standing up from a chair using your arms (e.g., wheelchair or bedside chair)?: A Lot Help needed to walk in hospital room?: A Lot Help needed climbing 3-5 steps with a railing? : Total 6 Click Score: 11    End of Session Equipment Utilized During Treatment: Oxygen Activity Tolerance: Patient limited by fatigue;Other (comment)(cognition) Patient left: in bed;with call bell/phone within reach;with bed alarm set;with nursing/sitter in room Nurse Communication: Mobility status PT Visit Diagnosis: Muscle weakness (generalized) (M62.81);Pain Pain - part of body: (generalized)     Time: 5852-7782 PT Time Calculation (min) (ACUTE ONLY): 13 min  Charges:  $Therapeutic Activity: 8-22 mins                     Marisa Severin, PT, DPT Acute Rehabilitation Services Pager [phone] Office [phone]       Marguarite Arbour A Sabra Heck 08/27/2019, 2:31 PM

## 2019-08-27 NOTE — Progress Notes (Signed)
PALLIATIVE NOTE:  Patient more confused and agitated today compared to yesterday. She is turned sideways in bed fidgeting with covers. Maintains eye contact. Unable to verbalize name. When asked about her son patient verbalized she did not have any children. Tachycardic. Will not follow commands. Attempted mouth care however patient refuses stating "I don't trust it. I don't know what that is!"   No family at the bedside.   I spoke with patient's son via phone. Updates provided. He verbalized understanding and appreciation of continued support and updates by medical team. Sharia Reeve has provided plans of SNF once care is medically optimized. He remains hopeful for improvement with watchful waiting.   All questions answered and support given. He has contact information.   Assessment -ill-appearing, restless, awake -Tachycardic -diminished bilaterally -will not follow commands, fidgeting in bed, unable to assess mentation  Plan -Continue current plan of care per medical team -Son hopeful for improvement. Watchful waiting.  -PMT will continue to support and follow   Time Total: 35 min.   Visit consisted of counseling and education dealing with the complex and emotionally intense issues of symptom management and palliative care in the setting of serious and potentially life-threatening illness.Greater than 50%  of this time was spent counseling and coordinating care related to the above assessment and plan.  Willette Alma, AGPCNP-BC  Palliative Medicine Team [phone]

## 2019-08-28 ENCOUNTER — Inpatient Hospital Stay (HOSPITAL_COMMUNITY): Payer: Medicaid Other

## 2019-08-28 DIAGNOSIS — K652 Spontaneous bacterial peritonitis: Secondary | ICD-10-CM

## 2019-08-28 DIAGNOSIS — R451 Restlessness and agitation: Secondary | ICD-10-CM

## 2019-08-28 DIAGNOSIS — I340 Nonrheumatic mitral (valve) insufficiency: Secondary | ICD-10-CM

## 2019-08-28 DIAGNOSIS — I361 Nonrheumatic tricuspid (valve) insufficiency: Secondary | ICD-10-CM

## 2019-08-28 LAB — COMPREHENSIVE METABOLIC PANEL
ALT: 29 U/L (ref 0–44)
AST: 26 U/L (ref 15–41)
Albumin: 1.4 g/dL — ABNORMAL LOW (ref 3.5–5.0)
Alkaline Phosphatase: 136 U/L — ABNORMAL HIGH (ref 38–126)
Anion gap: 9 (ref 5–15)
BUN: 32 mg/dL — ABNORMAL HIGH (ref 6–20)
CO2: 24 mmol/L (ref 22–32)
Calcium: 8.4 mg/dL — ABNORMAL LOW (ref 8.9–10.3)
Chloride: 108 mmol/L (ref 98–111)
Creatinine, Ser: 0.53 mg/dL (ref 0.44–1.00)
GFR calc Af Amer: >60 mL/min (ref >60)
GFR calc non Af Amer: >60 mL/min (ref >60)
Glucose, Bld: 168 mg/dL — ABNORMAL HIGH (ref 70–99)
Potassium: 4.4 mmol/L (ref 3.5–5.1)
Sodium: 141 mmol/L (ref 135–145)
Total Bilirubin: 0.8 mg/dL (ref 0.3–1.2)
Total Protein: 5.6 g/dL — ABNORMAL LOW (ref 6.5–8.1)

## 2019-08-28 LAB — CBC
HCT: 23 % — ABNORMAL LOW (ref 36.0–46.0)
Hemoglobin: 7 g/dL — ABNORMAL LOW (ref 12.0–15.0)
MCH: 32.9 pg (ref 26.0–34.0)
MCHC: 30.4 g/dL (ref 30.0–36.0)
MCV: 108 fL — ABNORMAL HIGH (ref 80.0–100.0)
Platelets: 195 10*3/uL (ref 150–400)
RBC: 2.13 MIL/uL — ABNORMAL LOW (ref 3.87–5.11)
RDW: 16.6 % — ABNORMAL HIGH (ref 11.5–15.5)
WBC: 12.7 10*3/uL — ABNORMAL HIGH (ref 4.0–10.5)
nRBC: 0 % (ref 0.0–0.2)

## 2019-08-28 LAB — CULTURE, BODY FLUID W GRAM STAIN -BOTTLE: Culture: NO GROWTH

## 2019-08-28 LAB — PATHOLOGIST SMEAR REVIEW

## 2019-08-28 LAB — GLUCOSE, CAPILLARY
Glucose-Capillary: 131 mg/dL — ABNORMAL HIGH (ref 70–99)
Glucose-Capillary: 154 mg/dL — ABNORMAL HIGH (ref 70–99)
Glucose-Capillary: 157 mg/dL — ABNORMAL HIGH (ref 70–99)
Glucose-Capillary: 157 mg/dL — ABNORMAL HIGH (ref 70–99)

## 2019-08-28 LAB — ECHOCARDIOGRAM COMPLETE
Height: 66 in
Weight: 2342.17 oz

## 2019-08-28 MED ORDER — TRAVASOL 10 % IV SOLN
INTRAVENOUS | Status: AC
  Filled 2019-08-28: qty 940.8

## 2019-08-28 MED ORDER — THIAMINE HCL 100 MG/ML IJ SOLN
250.0000 mg | Freq: Every day | INTRAVENOUS | Status: DC
  Administered 2019-08-28: 250 mg via INTRAVENOUS
  Filled 2019-08-28 (×4): qty 2.5

## 2019-08-28 MED ORDER — LORAZEPAM 2 MG/ML IJ SOLN
0.5000 mg | Freq: Once | INTRAMUSCULAR | Status: AC
  Administered 2019-08-28: 0.5 mg via INTRAVENOUS
  Filled 2019-08-28: qty 1

## 2019-08-28 NOTE — Plan of Care (Signed)

## 2019-08-28 NOTE — Progress Notes (Signed)
Subjective: HD#13 Events Overnight: No events overnight  Patient was seen this morning on rounds. Patient is more agitated today, requiring mittens. She remains tachycardic and tachypnic. It is difficult to assess her improvement over the last couple of days.    Objective:  Vital signs in last 24 hours: Vitals:   08/27/19 2100 08/27/19 2253 08/27/19 2258 08/28/19 0300  BP: 102/85   104/72  Pulse: (!) 123   (!) 120  Resp: (!) 23 (!) 30 20 20   Temp: 98.2 F (36.8 C)   98 F (36.7 C)  TempSrc: Oral   Oral  SpO2: 95%   99%  Weight:    66.4 kg  Height:       Supplemental O2: 2-4 liters   Physical Exam: Physical Exam  Constitutional: She appears cachectic. No distress.  HENT:  Head: Normocephalic and atraumatic.  Eyes: EOM are normal.  Cardiovascular: Intact distal pulses. Tachycardia present.  Pulmonary/Chest: Tachypnea noted. She has wheezes.  Abdominal: She exhibits no distension. There is abdominal tenderness.  Musculoskeletal:        General: No tenderness. Normal range of motion.     Cervical back: Normal range of motion.  Neurological: She is alert.  Skin: Skin is warm and dry. She is not diaphoretic.  Psychiatric: She is agitated.    Filed Weights   Sep 05, 2019 2336 07/31/2019 1656 08/28/19 0300  Weight: 52.2 kg 61.1 kg 66.4 kg     Intake/Output Summary (Last 24 hours) at 08/28/2019 0541 Last data filed at 08/28/2019 0417 Gross per 24 hour  Intake 735.04 ml  Output 1850 ml  Net -1114.96 ml    Risk Score:    Pertinent labs/Imaging: CBC Latest Ref Rng & Units 08/28/2019 08/27/2019 08/26/2019  WBC 4.0 - 10.5 K/uL 12.7(H) 10.9(H) 9.0  Hemoglobin 12.0 - 15.0 g/dL 7.0(L) 7.0(L) 7.7(L)  Hematocrit 36.0 - 46.0 % 23.0(L) 23.2(L) 23.6(L)  Platelets 150 - 400 K/uL 195 209 204    CMP Latest Ref Rng & Units 08/28/2019 08/27/2019 08/26/2019  Glucose 70 - 99 mg/dL 10/26/2019) 782(N) 562(Z)  BUN 6 - 20 mg/dL 308(M) 57(Q) 46(N)  Creatinine 0.44 - 1.00 mg/dL 62(X 5.28 4.13  Sodium 135  - 145 mmol/L 141 141 141  Potassium 3.5 - 5.1 mmol/L 4.4 4.7 4.6  Chloride 98 - 111 mmol/L 108 109 107  CO2 22 - 32 mmol/L 24 23 25   Calcium 8.9 - 10.3 mg/dL 2.44) ) 8.3(L)  Total Protein 6.5 - 8.1 g/dL 0.1(U) 5.6(L) 5.4(L)  Total Bilirubin 0.3 - 1.2 mg/dL 0.8 0.4 0.3  Alkaline Phos 38 - 126 U/L 136(H) 145(H) 156(H)  AST 15 - 41 U/L 26 27 24   ALT 0 - 44 U/L 29 27 23     No results found.  Echocardiogram: 1. Left ventricular ejection fraction, by estimation, is 45 to 50%. The  left ventricle has mildly decreased function. The left ventricle  demonstrates global hypokinesis. Indeterminate diastolic filling due to  E-A fusion.  2. Right ventricular systolic function is mildly reduced. The right  ventricular size is normal. There is mildly elevated pulmonary artery  systolic pressure.  3. Left atrial size was mildly dilated.  4. Right atrial size was mildly dilated.  5. The mitral valve is normal in structure. Mild to moderate mitral valve  regurgitation.  6. Tricuspid valve regurgitation is moderate to severe.  7. The aortic valve was not well visualized. Aortic valve regurgitation  is not visualized. No aortic stenosis is present.  8. The inferior  vena cava is dilated in size with <50% respiratory  variability, suggesting right atrial pressure of 15 mmHg.   Assessment/Plan:  Principal Problem:   Protein-calorie malnutrition, severe Active Problems:   Urinary retention   Nausea with vomiting   Colitis   Hematochezia   Ascites   Hypoalbuminemia   Enteritis   Generalized abdominal pain   Gastritis and gastroduodenitis   Acute esophagitis   Encephalopathy   Aspiration into airway   Dyspnea   Adult failure to thrive   Palliative care by specialist   Goals of care, counseling/discussion   DNR (do not resuscitate)   Palliative care encounter    Patient Summary: Catherine Butler is a 59 y.o. with pertinent PMH of COPD, hypertension who presented with nausea  and vomiting and admit for enteritis of uncertain etiology complicated by ascites/anasarca and significant protein-caloric malnutrition with concern for wet beriberi.  On hospital day 13 with overall improvement.  #Hypoxemia with respiratory distress, multifactorial #Aspiration pneumonia vs. Pneumonitis Reached out to [facility] yesterday he stated that the thiamine level will be back on Friday.  I stated that the patient is in critical condition and I would appreciate if they could try to expedite this process.  They said that they would do everything they can. Patient is more agitated today and continues to have tachypnea.  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    Marianna Payment, D.O. [facility], PGY-1 Date 08/28/2019 Time 5:41 AM

## 2019-08-28 NOTE — Progress Notes (Signed)
Nutrition Follow up  DOCUMENTATION CODES:   Severe malnutrition in context of chronic illness  INTERVENTION:    TPN to meet 100% of needs  Ensure Enlive po TID, each supplement provides 350 kcal and 20 grams of protein  MVI daily   NUTRITION DIAGNOSIS:   Severe Malnutrition related to chronic illness(colitis/esophagitis) as evidenced by mild fat depletion, severe muscle depletion, energy intake < or equal to 75% for > or equal to 1 month.  Ongoing  GOAL:   Patient will meet greater than or equal to 90% of their needs   Met with TPN  MONITOR:   PO intake, Supplement acceptance, Diet advancement, Skin, Weight trends, Labs, I & O's  REASON FOR ASSESSMENT:   Consult Assessment of nutrition requirement/status  ASSESSMENT:   Patient with PMH significant for COPD, HTN, Guillan Barre, quadriplegia, ETOH abuse, and recent admission for colitis. Presents this admission with ascites and colitis.   2/24- L paracentesis- 900 ml drained 2/25- EGD reveals severe esophagitis  2/26- start TPN 3/1- full liquids  3/5- DYS 3, thin liquids   Diet downgraded to DYS1 thin liquids yesterday due to mental status. Intake remains inadequate. Meal completions charted as 0% for her last eight meals. RD observed untouched lunch tray and untouched Ensure at bedside.   Receiving TPN @ 70 ml/hr to provide 94 g protein and 1737 kcal. Meets 100% of needs. Had discussion with resident regarding pt's ability to transition to Cortrak with EN vs continuing TPN. Pt with worsening intestinal edema. Awaiting thiamine lab to determine further GOC.   Admission weight: 52.2 kg  Current weight: 66.4 kg   I/O: -437 ml since admit  UOP: 1,850 ml x 24 hrs   Drips: thiamine  Medications: dulcolax, SS novolog, MVI with minerals  Labs: CBG 131-157  Diet Order:   Diet Order            DIET - DYS 1 Room service appropriate? No; Fluid consistency: Thin  Diet effective now              EDUCATION NEEDS:    Education needs have been addressed  Skin:  Skin Assessment: Reviewed RN Assessment  Last BM:  3/5  Height:   Ht Readings from Last 1 Encounters:  07/31/2019 '5\' 6"'$  (1.676 m)    Weight:   Wt Readings from Last 1 Encounters:  08/28/19 66.4 kg    Ideal Body Weight:  51.7 kg(adjusted quadriplegia)  BMI:  Body mass index is 23.63 kg/m.  Estimated Nutritional Needs:   Kcal:  1600-1800 kcal  Protein:  80-95 grams  Fluid:  >/= 1.6 L/day   Catherine Butler RD, LDN Clinical Nutrition Pager listed in [place]

## 2019-08-28 NOTE — Progress Notes (Signed)
  Echocardiogram 2D Echocardiogram has been performed.  Catherine Butler M 08/28/2019, 7:57 AM

## 2019-08-28 NOTE — Progress Notes (Signed)
PHARMACY - TOTAL PARENTERAL NUTRITION CONSULT NOTE   Indication: Severe esophagitis   Patient Measurements: Height: 5' 6" (167.6 cm) Weight: 146 lb 6.2 oz (66.4 kg) IBW/kg (Calculated) : 59.3 TPN AdjBW (KG): 52.2 Body mass index is 23.63 kg/m. Usual Weight: 61 kg  Assessment: 59 year old female with PMH significant for COPD, HTN, Guillan Barre, quadriplegia, ETOH abuse, and recent admission for colitis now presenting with ascites and colitis on 08/17/2019. Patient has not been eating well since discharge a week prior and feeling that food has been getting stuck in her throat leading to vomiting. Patient has severe muscle depletion and poor po intake due to loss of appetite for >1 month.   Glucose / Insulin: CBG controlled (150-168). 5 units of sSSI in last 24 hours. Off steroids. Electrolytes: K 4.4, Mg 2, Phos 4.1. CoCa 10.5- will remove from TPN. Others within normal limits.  Renal: SCr stable at 0.53 (monitoring closely with quadriplegia), BUN 32 (trend up) LFTs / TGs: Elevated Alk Phos- trending down. TG 165. Others within normal limits. Prealbumin / albumin: Pre-albumin updated 11/6; Alb 1.4 (up).  Intake / Output; MIVF: UOP 1.2 mL/kg/hr (improved), LBM 3/5. Anasarca noted on CT scan.  GI Imaging: 08/15/19 CT Abdomen - moderate bilateral pleural effusions, esophagitis and hiatal hernia, large volume ascites 08/22/19 CT Abdomen - PNA + increased bilateral effusions and anasarca, advanced fatty infiltration of liver suggestive of early cirrhosis, thickened small bowel loops from ascites and underlying liver disease- cannot exclude enteritis, no bowel obstruction Surgeries / Procedures:  08/15/19 - L paracentesis with 900 mL drained 08/02/2019 - EGD: severe esophagitis, 7 cm hiatal hernia, portal hypertensive gastropathy, moderate gastritis, two duodenal polyps which were resected.   Central access: PICC, 08/17/19 TPN start date: 08/17/19  Nutritional Goals (per RD recommendation on  08/22/19): KCal: 1600-1800, Protein: 80-95, Fluid: >= 1.6 L/ day Goal TPN rate is 70 mL/hr (provides 94 g of protein and 1737 kcals per day)  Current Nutrition:  Dys 3 diet - 0% intake, continued abdominal pain, poor appetite and very confused Ensure Enlive TID - 1 charted  Plan:  Continue TPN at goal rate of 70 mL/hr. Electrolytes in TPN: 50mEq/L of Na, reduce to 50mEq/L of K, remove Ca, 15mEq/L of Mg, and reduce back to 10mmol/L of Phos. Cl:Ac ratio 1:1 Continue MVI/TE as oral for now. Add Thiamine back to the TPN on 3/14 when high dose therapy complete and continue folic acid in TPN.  Continue Sensitive q6h SSI and adjust as needed  Monitor labs  Follow-up ability to take po intake versus Cortrak placement     , PharmD, BCPS, BCCCP Clinical Pharmacist Clinical phone 08/28/2019 until 3PM- #[phone] Please refer to [facility] for [facility] Pharmacy numbers 08/28/2019, 7:38 AM  

## 2019-08-28 NOTE — Progress Notes (Signed)
PALLIATIVE NOTE:  Patient restless in bed. Mittens in place. Increased agitation over the past several days. Confusion. Unable to answer questions appropriately. Will not follow commands. Continues to reach towards me. Remains on TPN and thiamine treatment.   Spoke with son via phone who confirms wishes to continue with current plan of care. He is appreciative of Palliative's support. States he has our number and will call if he have any further questions or needs. At this time he is remaining hopeful for some improvement requesting watchful waiting.   Plan -continue current plan of care per attending -Son remains hopeful but also somewhat preparing for the worst (no improvement/stability). Watchful waiting. States he has our number and will call with any further questions or needs.  -PMT will continue to support and follow as needed. Please call PMT phone line for any urgent needs.   Time Total: 25 min.   Visit consisted of counseling and education dealing with the complex and emotionally intense issues of symptom management and palliative care in the setting of serious and potentially life-threatening illness.Greater than 50%  of this time was spent counseling and coordinating care related to the above assessment and plan.  Willette Alma, AGPCNP-BC  Palliative Medicine Team [phone]

## 2019-08-29 DIAGNOSIS — R0902 Hypoxemia: Secondary | ICD-10-CM

## 2019-08-29 DIAGNOSIS — R0603 Acute respiratory distress: Secondary | ICD-10-CM

## 2019-08-29 LAB — GLUCOSE, CAPILLARY
Glucose-Capillary: 171 mg/dL — ABNORMAL HIGH (ref 70–99)
Glucose-Capillary: 152 mg/dL — ABNORMAL HIGH (ref 70–99)
Glucose-Capillary: 148 mg/dL — ABNORMAL HIGH (ref 70–99)
Glucose-Capillary: 124 mg/dL — ABNORMAL HIGH (ref 70–99)
Glucose-Capillary: 143 mg/dL — ABNORMAL HIGH (ref 70–99)
Glucose-Capillary: 140 mg/dL — ABNORMAL HIGH (ref 70–99)

## 2019-08-29 LAB — CBC
HCT: 25.3 % — ABNORMAL LOW (ref 36.0–46.0)
Hemoglobin: 7.7 g/dL — ABNORMAL LOW (ref 12.0–15.0)
MCH: 33.5 pg (ref 26.0–34.0)
MCHC: 30.4 g/dL (ref 30.0–36.0)
MCV: 110 fL — ABNORMAL HIGH (ref 80.0–100.0)
Platelets: 194 10*3/uL (ref 150–400)
RBC: 2.3 MIL/uL — ABNORMAL LOW (ref 3.87–5.11)
RDW: 16.1 % — ABNORMAL HIGH (ref 11.5–15.5)
WBC: 13 10*3/uL — ABNORMAL HIGH (ref 4.0–10.5)
nRBC: 0 % (ref 0.0–0.2)

## 2019-08-29 LAB — COMPREHENSIVE METABOLIC PANEL
ALT: 33 U/L (ref 0–44)
AST: 32 U/L (ref 15–41)
Albumin: 1.6 g/dL — ABNORMAL LOW (ref 3.5–5.0)
Alkaline Phosphatase: 164 U/L — ABNORMAL HIGH (ref 38–126)
Anion gap: 10 (ref 5–15)
BUN: 30 mg/dL — ABNORMAL HIGH (ref 6–20)
CO2: 23 mmol/L (ref 22–32)
Calcium: 8.6 mg/dL — ABNORMAL LOW (ref 8.9–10.3)
Chloride: 110 mmol/L (ref 98–111)
Creatinine, Ser: 0.5 mg/dL (ref 0.44–1.00)
GFR calc Af Amer: >60 mL/min (ref >60)
GFR calc non Af Amer: >60 mL/min (ref >60)
Glucose, Bld: 152 mg/dL — ABNORMAL HIGH (ref 70–99)
Potassium: 4.3 mmol/L (ref 3.5–5.1)
Sodium: 143 mmol/L (ref 135–145)
Total Bilirubin: 0.7 mg/dL (ref 0.3–1.2)
Total Protein: 6.4 g/dL — ABNORMAL LOW (ref 6.5–8.1)

## 2019-08-29 MED ORDER — PANCRELIPASE (LIP-PROT-AMYL) 36000-114000 UNITS PO CPEP
36000.0000 [IU] | ORAL_CAPSULE | Freq: Three times a day (TID) | ORAL | Status: DC
  Filled 2019-08-29 (×3): qty 1

## 2019-08-29 MED ORDER — TRAVASOL 10 % IV SOLN
INTRAVENOUS | Status: AC
  Filled 2019-08-29: qty 940.8

## 2019-08-29 MED ORDER — THIAMINE HCL 100 MG/ML IJ SOLN
250.0000 mg | Freq: Every evening | INTRAVENOUS | Status: DC | PRN
  Administered 2019-08-29: 250 mg via INTRAVENOUS
  Filled 2019-08-29 (×2): qty 2.5

## 2019-08-29 MED ORDER — INSULIN ASPART 100 UNIT/ML ~~LOC~~ SOLN
0.0000 [IU] | Freq: Three times a day (TID) | SUBCUTANEOUS | Status: DC
  Administered 2019-08-29 – 2019-09-01 (×8): 1 [IU] via SUBCUTANEOUS

## 2019-08-29 MED ORDER — THIAMINE HCL 100 MG/ML IJ SOLN
250.0000 mg | Freq: Every day | INTRAVENOUS | Status: AC
  Administered 2019-08-30 – 2019-09-01 (×2): 250 mg via INTRAVENOUS
  Filled 2019-08-29 (×2): qty 2.5

## 2019-08-29 NOTE — Progress Notes (Addendum)
Subjective: HD#14 Events Overnight: Improved HR and respiratory rate.  Patient was seen this morning on rounds. She shakes her head when asked if doing okay, but is unable to specify what is bothering her.   Objective:  Vital signs in last 24 hours: Vitals:   08/28/19 2117 08/28/19 2350 08/29/19 0129 08/29/19 0458  BP:  139/84 126/88 (!) 132/91  Pulse:  (!) 113 (!) 115 (!) 109  Resp:  (!) 21 20 20   Temp:  97.6 F (36.4 C) (!) 97.5 F (36.4 C) 98.3 F (36.8 C)  TempSrc:  Oral Oral   SpO2: 98% 95% 94% 92%  Weight:      Height:       Supplemental O2: 2-4 L  Physical Exam: Physical Exam  Constitutional: She appears cachectic. She has a sickly appearance. She appears distressed.  HENT:  Head: Atraumatic.  Eyes: EOM are normal.  Cardiovascular: Intact distal pulses. Tachycardia present.  Pulmonary/Chest: Tachypnea noted. She has wheezes.  Abdominal: She exhibits no distension. There is abdominal tenderness.  Musculoskeletal:     Cervical back: Normal range of motion.  Neurological: She is alert.  Skin: Skin is warm and dry.    Filed Weights   2019-08-15 2336 08/04/2019 1656 08/28/19 0300  Weight: 52.2 kg 61.1 kg 66.4 kg     Intake/Output Summary (Last 24 hours) at 08/29/2019 0707 Last data filed at 08/29/2019 0439 Gross per 24 hour  Intake 2337.39 ml  Output 900 ml  Net 1437.39 ml    Risk Score:  N/A  Pertinent labs/Imaging: CBC Latest Ref Rng & Units 08/28/2019 08/27/2019 08/26/2019  WBC 4.0 - 10.5 K/uL 12.7(H) 10.9(H) 9.0  Hemoglobin 12.0 - 15.0 g/dL 7.0(L) 7.0(L) 7.7(L)  Hematocrit 36.0 - 46.0 % 23.0(L) 23.2(L) 23.6(L)  Platelets 150 - 400 K/uL 195 209 204    CMP Latest Ref Rng & Units 08/28/2019 08/27/2019 08/26/2019  Glucose 70 - 99 mg/dL 10/26/2019) 562(Z) 308(M)  BUN 6 - 20 mg/dL 578(I) 69(G) 29(B)  Creatinine 0.44 - 1.00 mg/dL 28(U 1.32 4.40  Sodium 135 - 145 mmol/L 141 141 141  Potassium 3.5 - 5.1 mmol/L 4.4 4.7 4.6  Chloride 98 - 111 mmol/L 108 109 107  CO2 22 -  32 mmol/L 24 23 25   Calcium 8.9 - 10.3 mg/dL 1.02) ) 8.3(L)  Total Protein 6.5 - 8.1 g/dL 7.2(Z) 5.6(L) 5.4(L)  Total Bilirubin 0.3 - 1.2 mg/dL 0.8 0.4 0.3  Alkaline Phos 38 - 126 U/L 136(H) 145(H) 156(H)  AST 15 - 41 U/L 26 27 24   ALT 0 - 44 U/L 29 27 23     Echocardiogram: 1. Left ventricular ejection fraction, by estimation, is 45 to 50%. The left ventricle has mildly decreased function. The left ventricle demonstrates global hypokinesis. Indeterminate diastolic filling due to E-A fusion.   2. Right ventricular systolic function is mildly reduced. The right ventricular size is normal. There is mildly elevated pulmonary artery systolic pressure.   3. Left atrial size was mildly dilated.   4. Right atrial size was mildly dilated.   5. The mitral valve is normal in structure. Mild to moderate mitral valve regurgitation.   6. Tricuspid valve regurgitation is moderate to severe.   7. The aortic valve was not well visualized. Aortic valve regurgitation is not visualized. No aortic stenosis is present.   8. The inferior vena cava is dilated in size with <50% respiratory variability, suggesting right atrial pressure of 15 mmHg. Comparison(s): No prior Echocardiogram.  Pending: 1. Thiamine level  Assessment/Plan:  Principal Problem:   Protein-calorie malnutrition, severe Active Problems:   Urinary retention   Nausea with vomiting   Colitis   Hematochezia   Ascites   Hypoalbuminemia   Enteritis   Generalized abdominal pain   Gastritis and gastroduodenitis   Acute esophagitis   Encephalopathy   Aspiration into airway   Dyspnea   Adult failure to thrive   Palliative care by specialist   Goals of care, counseling/discussion   DNR (do not resuscitate)   Palliative care encounter    Patient Summary: Catherine Butler is a 59 y.o. with pertinent PMH of COPD, hypertension who presented with nausea and vomiting  and admit for enteritis of uncertain etiology complicated by  ascites/anasarca and significant protein-caloric malnutrition with concern for wet beriberi on hospital day 14  #Hypoxemia with respiratory distress, multifactorial #Aspiration pneumonia vs. Pneumonitis - Continue Thiamine 250 mg for 3 days. 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    Marianna Payment, D.O. MCIMTP, PGY-1 Date 08/29/2019 Time 7:07 AM

## 2019-08-29 NOTE — Progress Notes (Signed)
PHARMACY - TOTAL PARENTERAL NUTRITION CONSULT NOTE   Indication: Severe esophagitis   Patient Measurements: Height: '5\' 6"'$  (167.6 cm) Weight: 146 lb 6.2 oz (66.4 kg) IBW/kg (Calculated) : 59.3 TPN AdjBW (KG): 52.2 Body mass index is 23.63 kg/m. Usual Weight: 61 kg  Assessment: 59 year old female with PMH significant for COPD, HTN, Guillan Barre, quadriplegia, ETOH abuse, and recent admission for colitis now presenting with ascites and colitis on 07/26/2019. Patient has not been eating well since discharge a week prior and feeling that food has been getting stuck in her throat leading to vomiting. Patient has severe muscle depletion and poor po intake due to loss of appetite for >1 month.   Glucose / Insulin: CBG controlled (131-171). 7 units of sSSI in last 24 hours. Off steroids. Electrolytes: K 4.3. CoCa 10.5- none in TPN. Others within normal limits.  Renal: SCr stable at 0.5 (monitoring closely with quadriplegia), BUN 30 (trend down) LFTs / TGs: Elevated Alk Phos- up to 164. TG 165. Others within normal limits. Prealbumin / albumin: Pre-albumin up to 11.6; Alb 1.6 (up).  Intake / Output; MIVF: UOP 0.8 mL/kg/hr, LBM 3/5. Anasarca noted on CT scan.  GI Imaging: 08/15/19 CT Abdomen - moderate bilateral pleural effusions, esophagitis and hiatal hernia, large volume ascites 08/22/19 CT Abdomen - PNA + increased bilateral effusions and anasarca, advanced fatty infiltration of liver suggestive of early cirrhosis, thickened small bowel loops from ascites and underlying liver disease- cannot exclude enteritis, no bowel obstruction Surgeries / Procedures:  08/15/19 - L paracentesis with 900 mL drained 08/18/2019 - EGD: severe esophagitis, 7 cm hiatal hernia, portal hypertensive gastropathy, moderate gastritis, two duodenal polyps which were resected.   Central access: PICC, 08/17/19 TPN start date: 08/17/19  Nutritional Goals (per RD recommendation on 08/28/19): KCal: 1600-1800, Protein: 80-95, Fluid: >=  1.6 L/ day Goal TPN rate is 70 mL/hr (provides 94 g of protein and 1737 kcals per day)  Current Nutrition:  Dys 1 diet - 0% intake, continued abdominal pain, poor appetite and very confused Ensure Enlive TID -0 charted on 3/9  Plan:  Continue TPN at goal rate of 70 mL/hr. Electrolytes in TPN: 59mq/L of Na, 571m/L of K, remove Ca, 1566mL of Mg, and 48m48mL of Phos. Cl:Ac ratio 1:1 Continue MVI/TE as oral for now. Add Thiamine back to the TPN on 3/14 when high dose therapy complete and continue folic acid in TPN.  Adjust Sensitive q8h SSI and adjust as needed  Monitor labs  Follow-up ability to take po intake versus Cortrak placement    JessSloan LeiterarmD, BCPS, BCCCP Clinical Pharmacist Clinical phone 08/29/2019 until 3PM- #[phone] refer to [facility]t [facility] PR[facility]bers 08/29/2019, 8:00 AM

## 2019-08-29 NOTE — Progress Notes (Signed)
  Speech Language Pathology Treatment: Dysphagia  Patient Details Name: Catherine Butler MRN: [id number] DOB: [birth year] Today's Date: 08/29/2019 Time: 6767-2094 SLP Time Calculation (min) (ACUTE ONLY): 15 min  Assessment / Plan / Recommendation Clinical Impression  Pt was too lethargic for PO intake this morning. NT reports that pt declined all of her breakfast tray this morning. SLP provided Max multimodal cueing but pt would only open her eyes for a second before closing them again. No reaction was noted with oral stimulation with various POs. Would continue with her current diet but only when she is alert and accepting. This may continue to make it difficult for her to receive adequate nutrition orally. Will follow for potential to re-advance solids pending improvements in mentation.    HPI HPI: 59 y.o. female  with past medical history of COPD, guillain-barre/AIDP w/ chronic weakness, HTN, and ETOH use admitted on 07/27/2019 with abdominal pain, nausea, and vomiting. She was found to be severely dehydrated with multiple severe electrolyte abnormalities. Being treated for pneumonitis vs aspiration pna and COPD exacerbation. Patient  ETOH use with significant esophagitis/gastritis.  Palliative following for GOC. Patient known to ST service, participated in MBSS 08/10/19: grossly normal swallow function. However, MBSS revealed esophageal retention and assessment of the esophageal phase was recommended.  CXR 08/22/19: Stable diffuse right lung opacities concerning for pneumonia.      SLP Plan  Continue with current plan of care       Recommendations  Diet recommendations: Dysphagia 1 (puree);Thin liquid Liquids provided via: Cup;Straw Medication Administration: Crushed with puree Supervision: Staff to assist with self feeding;Full supervision/cueing for compensatory strategies Compensations: Minimize environmental distractions;Slow rate;Small sips/bites;Follow solids with liquid Postural Changes  and/or Swallow Maneuvers: Seated upright 90 degrees;Upright 30-60 min after meal                Oral Care Recommendations: Oral care QID;Staff/trained caregiver to provide oral care Follow up Recommendations: Skilled Nursing facility SLP Visit Diagnosis: Dysphagia, unspecified (R13.10) Plan: Continue with current plan of care       GO                Mahala Menghini., M.A. CCC-SLP Acute Rehabilitation Services Pager [phone] Office [phone]  08/29/2019, 1:03 PM

## 2019-08-29 NOTE — Plan of Care (Signed)
  Problem: Education: Goal: Knowledge of General Education information will improve Description: Including pain rating scale, medication(s)/side effects and non-pharmacologic comfort measures Outcome: Not Progressing   Problem: Clinical Measurements: Goal: Will remain free from infection Outcome: Progressing   Problem: Activity: Goal: Risk for activity intolerance will decrease Outcome: Not Progressing   Problem: Nutrition: Goal: Adequate nutrition will be maintained Outcome: Progressing   Problem: Safety: Goal: Ability to remain free from injury will improve Outcome: Not Progressing   Problem: Skin Integrity: Goal: Risk for impaired skin integrity will decrease Outcome: Progressing

## 2019-08-29 NOTE — Progress Notes (Signed)
Occupational Therapy Treatment Patient Details Name: Catherine Butler MRN: [id number] DOB: 31-Aug-1960 Today's Date: 08/29/2019    History of present illness 59 year old female with PMH for COPD, HTN, AIDP, recent hospital admission for colitis (2/14-2/19) who presented on 08/31/2019 with abdominal pain, nausea, and vomiting.   OT comments  Pt seen for OT f/u, with focus on BADL engagement. Pt with poor cognitive status this session as described below. She was not able to purposefully engage in tasks presented to her, would become combative and refuse. Attempted to initiate oral care, pt moving head away, swatting, yelling. Pt sat EOB with max A +2 for initiation and impulsively lied back down with min A +2. D/c recs updated to reflect current progress. Will continue to follow.   Follow Up Recommendations  SNF;Supervision/Assistance - 24 hour    Equipment Recommendations  None recommended by OT    Recommendations for Other Services      Precautions / Restrictions Precautions Precautions: Fall Precaution Comments: watch HR and O2 sats Restrictions Weight Bearing Restrictions: No       Mobility Bed Mobility Overal bed mobility: Needs Assistance Bed Mobility: Supine to Sit;Sit to Supine;Rolling Rolling: Max assist   Supine to sit: Max assist;+2 for safety/equipment Sit to supine: Min assist;+2 for safety/equipment   General bed mobility comments: pt sitting EOB with max A +2 for initiation and safety mainting, attempted oral care but pt impulsively laying back down at min A +2  Transfers                 General transfer comment: Declined any further mobility.    Balance Overall balance assessment: Needs assistance Sitting-balance support: Feet unsupported;Bilateral upper extremity supported Sitting balance-Leahy Scale: Poor Sitting balance - Comments: Min guard-Min A sitting up; poor sitting tolerance; occasional elbow prop on knees in tripod-like positioning                                    ADL either performed or assessed with clinical judgement   ADL                                         General ADL Comments: currently total A for all BADL 2/2 cognitive status. Attempted to initiate grooming tasks, pt yelling "stop" and swinging at therapist     Vision   Vision Assessment?: No apparent visual deficits   Perception     Praxis      Cognition Arousal/Alertness: Awake/alert Behavior During Therapy: Restless Overall Cognitive Status: Impaired/Different from baseline Area of Impairment: Orientation;Attention;Memory;Safety/judgement;Following commands;Awareness;Problem solving                 Orientation Level: Disoriented to;Situation Current Attention Level: Sustained Memory: Decreased recall of precautions;Decreased short-term memory Following Commands: Follows one step commands inconsistently Safety/Judgement: Decreased awareness of safety;Decreased awareness of deficits Awareness: Intellectual Problem Solving: Slow processing;Requires verbal cues;Decreased initiation;Difficulty sequencing;Requires tactile cues General Comments: pt very restless, moving around in bed with severely impaired attention. Not consistently following commands and trying to remove restraints. Swinging at therapist with mobility        Exercises     Shoulder Instructions       General Comments Session conducted on supplemental O2, 4L; O2 sats ranged 87% (observed lowest) to 93%; Assisted IV Team in tending to her PICC with  stabilizing LUE, and providing distraction    Pertinent Vitals/ Pain       Pain Assessment: Faces Faces Pain Scale: Hurts a little bit Pain Location: generalized, and with IV Team tending to PICC line L upper arm Pain Descriptors / Indicators: Discomfort Pain Intervention(s): Repositioned  Home Living                                          Prior Functioning/Environment               Frequency  Min 1X/week        Progress Toward Goals  OT Goals(current goals can now be found in the care plan section)  Progress towards OT goals: Not progressing toward goals - comment(decline in function, decreased cognitive status)  Acute Rehab OT Goals Patient Stated Goal: Unable to state OT Goal Formulation: Patient unable to participate in goal setting Time For Goal Achievement: 09/02/19 Potential to Achieve Goals: [place] Discharge plan needs to be updated;Frequency needs to be updated    Co-evaluation    PT/OT/SLP Co-Evaluation/Treatment: Yes Reason for Co-Treatment: For patient/therapist safety;Necessary to address cognition/behavior during functional activity PT goals addressed during session: Mobility/safety with mobility OT goals addressed during session: ADL's and self-care;Proper use of Adaptive equipment and DME      AM-PAC OT "6 Clicks" Daily Activity     Outcome Measure   Help from another person eating meals?: Total Help from another person taking care of personal grooming?: Total Help from another person toileting, which includes using toliet, bedpan, or urinal?: Total Help from another person bathing (including washing, rinsing, drying)?: Total Help from another person to put on and taking off regular upper body clothing?: Total Help from another person to put on and taking off regular lower body clothing?: Total 6 Click Score: 6    End of Session    OT Visit Diagnosis: Muscle weakness (generalized) (M62.81);Other symptoms and signs involving cognitive function Pain - part of body: (generalized)   Activity Tolerance Treatment limited secondary to agitation   Patient Left in bed;with call bell/phone within reach;with bed alarm set;with restraints reapplied   Nurse Communication Mobility status        Time: 0539-7673 OT Time Calculation (min): 31 min  Charges: OT General Charges $OT Visit: 1 Visit OT Treatments $Self  Care/Home Management : 8-22 mins  Zenovia Jarred, MSOT, OTR/L [facility] Office Number: [phone] Pager: [phone]  Zenovia Jarred 08/29/2019, 1:22 PM

## 2019-08-29 NOTE — Plan of Care (Addendum)
IV team RN questioned necessity of PIV and PICC line. RN consulted with pharmacy about interactions and compatibility and based on pt's meds and orders, she recommended that the pt keep the PIV in addition to the PICC line.   Pt AMS, unable to create password.   Addendum: Pt satting in the 80's on 4L and dyspneic, consulted RT. Lauren, RT assessed and bumped up to 5L. She spoke with Marchia Bond, MD who stated pt is at baseline and is "okay" for now. Will continue to monitor.   Problem: Education: Goal: Knowledge of General Education information will improve Description: Including pain rating scale, medication(s)/side effects and non-pharmacologic comfort measures Outcome: Progressing   Problem: Health Behavior/Discharge Planning: Goal: Ability to manage health-related needs will improve Outcome: Progressing   Problem: Clinical Measurements: Goal: Will remain free from infection Outcome: Progressing Goal: Respiratory complications will improve Outcome: Progressing Goal: Cardiovascular complication will be avoided Outcome: Progressing   Problem: Activity: Goal: Risk for activity intolerance will decrease Outcome: Progressing   Problem: Nutrition: Goal: Adequate nutrition will be maintained Outcome: Progressing   Problem: Elimination: Goal: Will not experience complications related to bowel motility Outcome: Progressing Goal: Will not experience complications related to urinary retention Outcome: Progressing   Problem: Pain Managment: Goal: General experience of comfort will improve Outcome: Progressing   Problem: Safety: Goal: Ability to remain free from injury will improve Outcome: Progressing   Problem: Skin Integrity: Goal: Risk for impaired skin integrity will decrease Outcome: Progressing

## 2019-08-29 NOTE — Progress Notes (Signed)
Internal Medicine Attending Note:  I have seen and evaluated this patient and I have discussed the plan of care with the house staff. Please see their note for complete details. I concur with their findings with the following corrections:  She has completed a course of antibiotics for aspiration PNA which is now resolved. Ativan is for agitation, not respiratory distress. Otherwise continue with thiamine 250 mg IV daily (day 2 of 3). Looks like this was not given today. Dr. Marchia Bond will speak with her RN. Continue TPN per pharmacy.   We are starting creon today per GI prior recs if able to tolerate PO.   Reymundo Poll, MD 08/29/2019, 6:30 PM

## 2019-08-30 ENCOUNTER — Inpatient Hospital Stay (HOSPITAL_COMMUNITY): Payer: Medicaid Other

## 2019-08-30 ENCOUNTER — Inpatient Hospital Stay (INDEPENDENT_AMBULATORY_CARE_PROVIDER_SITE_OTHER): Payer: Medicaid Other | Admitting: Primary Care

## 2019-08-30 LAB — COMPREHENSIVE METABOLIC PANEL
ALT: 27 U/L (ref 0–44)
AST: 24 U/L (ref 15–41)
Albumin: 1.3 g/dL — ABNORMAL LOW (ref 3.5–5.0)
Alkaline Phosphatase: 156 U/L — ABNORMAL HIGH (ref 38–126)
Anion gap: 6 (ref 5–15)
BUN: 26 mg/dL — ABNORMAL HIGH (ref 6–20)
CO2: 26 mmol/L (ref 22–32)
Calcium: 8.3 mg/dL — ABNORMAL LOW (ref 8.9–10.3)
Chloride: 113 mmol/L — ABNORMAL HIGH (ref 98–111)
Creatinine, Ser: 0.43 mg/dL — ABNORMAL LOW (ref 0.44–1.00)
GFR calc Af Amer: >60 mL/min (ref >60)
GFR calc non Af Amer: >60 mL/min (ref >60)
Glucose, Bld: 140 mg/dL — ABNORMAL HIGH (ref 70–99)
Potassium: 3.9 mmol/L (ref 3.5–5.1)
Sodium: 145 mmol/L (ref 135–145)
Total Bilirubin: 0.5 mg/dL (ref 0.3–1.2)
Total Protein: 5.6 g/dL — ABNORMAL LOW (ref 6.5–8.1)

## 2019-08-30 LAB — CBC
HCT: 22.4 % — ABNORMAL LOW (ref 36.0–46.0)
Hemoglobin: 6.7 g/dL — CL (ref 12.0–15.0)
MCH: 33.3 pg (ref 26.0–34.0)
MCHC: 29.9 g/dL — ABNORMAL LOW (ref 30.0–36.0)
MCV: 111.4 fL — ABNORMAL HIGH (ref 80.0–100.0)
Platelets: 166 10*3/uL (ref 150–400)
RBC: 2.01 MIL/uL — ABNORMAL LOW (ref 3.87–5.11)
RDW: 15.6 % — ABNORMAL HIGH (ref 11.5–15.5)
WBC: 12.8 10*3/uL — ABNORMAL HIGH (ref 4.0–10.5)
nRBC: 0 % (ref 0.0–0.2)

## 2019-08-30 LAB — MAGNESIUM: Magnesium: 1.8 mg/dL (ref 1.7–2.4)

## 2019-08-30 LAB — GLUCOSE, CAPILLARY
Glucose-Capillary: 128 mg/dL — ABNORMAL HIGH (ref 70–99)
Glucose-Capillary: 131 mg/dL — ABNORMAL HIGH (ref 70–99)
Glucose-Capillary: 135 mg/dL — ABNORMAL HIGH (ref 70–99)
Glucose-Capillary: 148 mg/dL — ABNORMAL HIGH (ref 70–99)
Glucose-Capillary: 148 mg/dL — ABNORMAL HIGH (ref 70–99)

## 2019-08-30 LAB — HEMOGLOBIN AND HEMATOCRIT, BLOOD
HCT: 26.4 % — ABNORMAL LOW (ref 36.0–46.0)
Hemoglobin: 8.2 g/dL — ABNORMAL LOW (ref 12.0–15.0)

## 2019-08-30 LAB — PREPARE RBC (CROSSMATCH)

## 2019-08-30 LAB — VITAMIN B1: Vitamin B1 (Thiamine): 187.3 nmol/L (ref 66.5–200.0)

## 2019-08-30 LAB — PHOSPHORUS: Phosphorus: 2.6 mg/dL (ref 2.5–4.6)

## 2019-08-30 MED ORDER — SODIUM CHLORIDE 0.9% IV SOLUTION: Freq: Once | INTRAVENOUS | Status: DC

## 2019-08-30 MED ORDER — TRAVASOL 10 % IV SOLN
INTRAVENOUS | Status: AC
  Filled 2019-08-30: qty 940.8

## 2019-08-30 MED ORDER — LORAZEPAM 2 MG/ML IJ SOLN
0.5000 mg | Freq: Once | INTRAMUSCULAR | Status: AC | PRN
  Administered 2019-08-31: 0.5 mg via INTRAVENOUS
  Filled 2019-08-30: qty 1

## 2019-08-30 MED ORDER — PANCRELIPASE (LIP-PROT-AMYL) 36000-114000 UNITS PO CPEP
36000.0000 [IU] | ORAL_CAPSULE | Freq: Three times a day (TID) | ORAL | Status: DC
  Administered 2019-08-31: 36000 [IU] via ORAL
  Filled 2019-08-30 (×8): qty 1

## 2019-08-30 MED ORDER — LEVALBUTEROL HCL 0.63 MG/3ML IN NEBU: 0.6300 mg | INHALATION_SOLUTION | Freq: Four times a day (QID) | RESPIRATORY_TRACT | Status: DC | PRN

## 2019-08-30 MED ORDER — LACTULOSE ENEMA
300.0000 mL | Freq: Three times a day (TID) | ORAL | Status: DC
  Administered 2019-08-30 – 2019-08-31 (×4): 300 mL via RECTAL
  Filled 2019-08-30 (×6): qty 300

## 2019-08-30 MED ORDER — LACTULOSE 10 GM/15ML PO SOLN: 20.0000 g | Freq: Three times a day (TID) | ORAL | Status: DC

## 2019-08-30 MED ORDER — POTASSIUM PHOSPHATES 15 MMOLE/5ML IV SOLN
10.0000 mmol | Freq: Once | INTRAVENOUS | Status: AC
  Administered 2019-08-30: 10 mmol via INTRAVENOUS
  Filled 2019-08-30: qty 3.33

## 2019-08-30 NOTE — Progress Notes (Signed)
Per provider - NG tube can wait until blood transfusion is complete. Pt's MEWS score is a yellow 2, not an acute change. Providers notified, no new orders.

## 2019-08-30 NOTE — Progress Notes (Signed)
Subjective: HD#15 Events Overnight: Low Hgb of 6.7. Transfusion ordered.   Patient was seen this morning on rounds.  Patient was resting in bed she appeared to be in distress.  She was not as alert today as she was yesterday.  Objective:  Vital signs in last 24 hours: Vitals:   08/29/19 1558 08/29/19 1945 08/29/19 2347 08/30/19 0423  BP: (!) 150/91 112/66 122/90 (!) 132/92  Pulse: (!) 117 98 (!) 108 (!) 110  Resp: 18 17 19 20   Temp: 98.2 F (36.8 C) 98 F (36.7 C) 99.1 F (37.3 C) 98 F (36.7 C)  TempSrc: Oral Oral Oral Oral  SpO2: 96% 94% 91% 91%  Weight:      Height:       Supplemental O2: 2-4L   Physical Exam: Physical Exam  Constitutional: She appears unhealthy. She appears cachectic. She has a sickly appearance.  HENT:  Head: Normocephalic and atraumatic.  Eyes: EOM are normal.  Cardiovascular: Intact distal pulses. Tachycardia present.  Pulmonary/Chest: Tachypnea noted. She is in respiratory distress.  Abdominal: Soft. She exhibits no distension. There is abdominal tenderness.  Musculoskeletal:        General: No edema. Normal range of motion.     Cervical back: Normal range of motion.  Skin: Skin is warm and dry.    Filed Weights   08/20/19 2336 07/23/2019 1656 08/28/19 0300  Weight: 52.2 kg 61.1 kg 66.4 kg     Intake/Output Summary (Last 24 hours) at 08/30/2019 0637 Last data filed at 08/30/2019 0600 Gross per 24 hour  Intake 1570.26 ml  Output 1425 ml  Net 145.26 ml    Risk Score:    Pertinent labs/Imaging: CBC Latest Ref Rng & Units 08/30/2019 08/29/2019 08/28/2019  WBC 4.0 - 10.5 K/uL 12.8(H) 13.0(H) 12.7(H)  Hemoglobin 12.0 - 15.0 g/dL 6.7(LL) 7.7(L) 7.0(L)  Hematocrit 36.0 - 46.0 % 22.4(L) 25.3(L) 23.0(L)  Platelets 150 - 400 K/uL 166 194 195    CMP Latest Ref Rng & Units 08/30/2019 08/29/2019 08/28/2019  Glucose 70 - 99 mg/dL 10/28/2019) 324(M) 010(U)  BUN 6 - 20 mg/dL 725(D) 66(Y) 40(H)  Creatinine 0.44 - 1.00 mg/dL 47(Q) 2.59(D 6.38  Sodium 135  - 145 mmol/L 145 143 141  Potassium 3.5 - 5.1 mmol/L 3.9 4.3 4.4  Chloride 98 - 111 mmol/L 113(H) 110 108  CO2 22 - 32 mmol/L 26 23 24   Calcium 8.9 - 10.3 mg/dL 8.3(L) 8.6(L) 8.4(L)  Total Protein 6.5 - 8.1 g/dL 7.56) 6.4(L) 5.6(L)  Total Bilirubin 0.3 - 1.2 mg/dL 0.5 0.7 0.8  Alkaline Phos 38 - 126 U/L 156(H) 164(H) 136(H)  AST 15 - 41 U/L 24 32 26  ALT 0 - 44 U/L 27 33 29   Mg: 1.8 Phos: 2.6 Thiamine: 187.3 nmol/L  Assessment/Plan:  Principal Problem:   Protein-calorie malnutrition, severe Active Problems:   Urinary retention   Nausea with vomiting   Colitis   Hematochezia   Ascites   Hypoalbuminemia   Enteritis   Generalized abdominal pain   Gastritis and gastroduodenitis   Acute esophagitis   Encephalopathy   Aspiration into airway   Dyspnea   Adult failure to thrive   Palliative care by specialist   Goals of care, counseling/discussion   DNR (do not resuscitate)   Palliative care encounter    Patient Summary: Catherine Butler is a 59 y.o. with pertinent PMH of COPD and HTN who presented with nausea and vomiting and admit for enteritis of uncertain etiology complicated byascites/anasarca  and significant protein-caloric malnutrition with concern forwet beriberi  on hospital day 15  #Hypoxemia with respiratory distress, multifactorial - Continue Thiamine 250 mg for 3 days. -Continue supplemental O2 as needed  #COPD exacerbation - Continue nebulizers and oxygen as needed  #Cirrhosis: Patient has concern for hepatic encephalopathy.  Considering she is acutely altered from her baseline mental status, we will get an NG tube inserted and begin lactulose therapy.  We will titrate this to achieve 2-3 bowel movements daily with the hope that this will be a reversible cause of her acute encephalopathy. -NG tube placement -Lactulose 20 mg 3 times daily titrate to 2-3 bowel movements - Can give ativan 0.5 mg for tube placement  -Flexi-Seal for bowel movements as  needed. -Soft wrist restraints as needed today in order to maintain NG tube in the setting of acute encephalopathy.  #Enteritis,unknown etiologyvs.Chronic pancreatitis #Anasarca -Continue nutrient supplementation with tube feeds. -Continue Creon when patient is eating.  #Macrocytic anemia: - Roughly 1 g drop repeat CBC  Diet: TPN IVF: None,None VTE: SCDs Code: DNR PT/OT recs: Pending TOC recs: pending   Dispo: Anticipated discharge pending clinical improvement.    Marianna Payment, D.O. MCIMTP, PGY-1 Date 08/30/2019 Time 6:37 AM

## 2019-08-30 NOTE — Progress Notes (Addendum)
Pt's son called to give consent to administer blood products due to pt's mental status. Consent in pt's chart. Vital signs taken, pt has order for 1 unit of PRBC. No signs of blood transfusion reaction within the first 15 minutes.

## 2019-08-30 NOTE — Progress Notes (Signed)
Received call from provider with orders to d/c NG tube if pt vomits. If d/c NG tube, give rectal lactulose. NG tube placed, waiting on KUB to confirm placement before administering medications.

## 2019-08-30 NOTE — Progress Notes (Addendum)
CRITICAL VALUE ALERT  Critical Value:  Hgb 6.7  Date & Time Notied:  08/30/19, 3716  Provider Notified: Yes. On call MD Darl Pikes  Orders Received/Actions taken:Orders were given to transfuse blood. Type and screen has been ordered.

## 2019-08-30 NOTE — Progress Notes (Signed)
PHARMACY - TOTAL PARENTERAL NUTRITION CONSULT NOTE   Indication: Severe esophagitis   Patient Measurements: Height: '5\' 6"'$  (167.6 cm) Weight: 146 lb 6.2 oz (66.4 kg) IBW/kg (Calculated) : 59.3 TPN AdjBW (KG): 52.2 Body mass index is 23.63 kg/m. Usual Weight: 61 kg  Assessment: 59 year old female with PMH significant for COPD, HTN, Guillan Barre, quadriplegia, ETOH abuse, and recent admission for colitis now presenting with ascites and colitis on 07/26/2019. Patient has not been eating well since discharge a week prior and feeling that food has been getting stuck in her throat leading to vomiting. Patient has severe muscle depletion and poor po intake due to loss of appetite for >1 month.   Glucose / Insulin: CBG controlled (140s). 4 units of sSSI in last 24 hours. Off steroids. Electrolytes: K 3.9. Phos 4.4>>2.6 (given KPhos 69mol this AM). Mg 1.8. CoCa 10.5- none in TPN. Cl up 113. Renal: SCr stable at 0.43 (monitoring closely with quadriplegia), BUN 26 (trend down) LFTs / TGs: Elevated Alk Phos, trend down 156. TG 165. Others within normal limits. Prealbumin / albumin: Pre-albumin 11.6; Alb 1.3. Intake / Output; MIVF: UOP 0.9 mL/kg/hr, LBM 3/10 x2 after suppository. Anasarca noted on CT scan.  GI Imaging: 08/15/19 CT Abdomen - moderate bilateral pleural effusions, esophagitis and hiatal hernia, large volume ascites 08/22/19 CT Abdomen - PNA + increased bilateral effusions and anasarca, advanced fatty infiltration of liver suggestive of early cirrhosis, thickened small bowel loops from ascites and underlying liver disease- cannot exclude enteritis, no bowel obstruction Surgeries / Procedures:  08/15/19 - L paracentesis with 900 mL drained 07/30/2019 - EGD: severe esophagitis, 7 cm hiatal hernia, portal hypertensive gastropathy, moderate gastritis, two duodenal polyps which were resected.   Central access: PICC, 08/17/19 TPN start date: 08/17/19  Nutritional Goals (per RD recommendation on  08/28/19): KCal: 1600-1800, Protein: 80-95, Fluid: >= 1.6 L/ day Goal TPN rate is 70 mL/hr (provides 94 g of protein and 1737 kcals per day)  Current Nutrition:  Dys 1 diet - 0% intake, continued abdominal pain, poor appetite and very confused Ensure Enlive TID -0 charted  Plan:  Continue TPN at goal rate of 70 mL/hr. Electrolytes in TPN: 513m/L of Na, 5049mL of K, remove Ca, 21m36m of Mg, and 15mm57m of Phos. Cl:Ac ratio 1:2 Due to lethargy and inability to take oral medications yesterday, will discontinue MVI and place MVI in TPN MWF only due to national shortage.  Will add back trace elements to TPN since discontinuing oral MVI.  Add Thiamine back to the TPN on 3/14 when high dose therapy complete. Continue folic acid in TPN.  Continue Sensitive q8h SSI and adjust as needed  Monitor labs  Follow-up ability to take po intake versus Cortrak placement    JessiSloan LeiterrmD, BCPS, BCCCP Clinical Pharmacist Clinical phone 08/30/2019 until 3PM- #[phone] refer to [facility] 08/30/2019, 8:08 AM

## 2019-08-30 NOTE — TOC Progression Note (Addendum)
Transition of Care [facility]) - Progression Note    Patient Details  Name: Catherine Butler MRN: [id number] Date of Birth: 05-01-1961  Transition of Care [facility]) CM/SW Contact  Epifanio Lesches, RN Phone Number: 08/30/2019, 8:31 AM  Clinical Narrative:    Pt transferred to 5N 3/10/200 Admitted 07/24/2019 with  Ascites and colitis. Hx of COPD, HTN, Guillan Barre, quadriplegia, ETOH abuse.  Pt not  medically ready for SNF level of care . Palliative team/ Henry Russel following...  will meet with son Sharia Reeve on Sunday @ 2pm to discuss GOC. TOC team will continue to monitor and follow for needs....  Expected Discharge Plan: Skilled Nursing Facility(vs residental hospice vs hopital dealth , palliative following) Barriers to Discharge: Continued Medical Work up  Expected Discharge Plan and Services Expected Discharge Plan: Skilled Nursing Facility(vs residental hospice vs hopital dealth , palliative following) In-house Referral: NA Discharge Planning Services: CM Consult Post Acute Care Choice: Skilled Nursing Facility Living arrangements for the past 2 months: Single Family Home                 DME Arranged: (NA)         HH Arranged: NA           Social Determinants of Health (SDOH) Interventions    Readmission Risk Interventions Readmission Risk Prevention Plan 08/17/2019 08/17/2019  Transportation Screening Complete Complete  PCP or Specialist Appt within 3-5 Days Complete -  HRI or Home Care Consult (No Data) -  Social Work Consult for Recovery Care Planning/Counseling - Complete  Palliative Care Screening Not Applicable Not Applicable  Medication Review Oceanographer) Complete Complete  Some recent data might be hidden

## 2019-08-30 NOTE — Progress Notes (Signed)
Nutrition Follow-up  DOCUMENTATION CODES:   Severe malnutrition in context of chronic illness  INTERVENTION:   -Continue TPN management per pharmacy -Continue MVI with minerals daily -Continue Ensure Enlive po TID, each supplement provides 350 kcal and 20 grams of protein -RD will monitor for ability to transition to enteral nutrtion  NUTRITION DIAGNOSIS:   Severe Malnutrition related to chronic illness(colitis/esophagtitis) as evidenced by mild fat depletion, severe muscle depletion, energy intake < or equal to 75% for > or equal to 1 month.  Ongoing  GOAL:   Patient will meet greater than or equal to 90% of their needs  Met with TPN  MONITOR:   PO intake, Supplement acceptance, Diet advancement, Skin, Weight trends, Labs, I & O's  REASON FOR ASSESSMENT:   Consult Assessment of nutrition requirement/status  ASSESSMENT:   Patient with PMH significant for COPD, HTN, Guillan Barre, quadriplegia, ETOH abuse, and recent admission for colitis. Presents this admission with ascites and colitis.  2/24- L paracentesis- 900 ml drained 2/25- EGD reveals severe esophagitis  2/26- start TPN 3/1- full liquids  3/5- DYS 3, thin liquids  3/8- dpwngraded to dysphagia 1 diet with thin liquids  Reviewed I/O's: +145 ml x 24 hours and +3.5 L since 07/26/2019  UOP: 1.4 L x 24 hours  Pt with worsening encephalopathy today. She remains unable to eat and drink; noted meal completion 0% and pt too lethargic to take oral nutrition supplements.   Case discussed with RN and MD. They confirm placement of NGT or cortrak tube for lactulose therapy. Per discussion with MD, plan to continue with TPN and hold off on TPN, as "bowels have not woken up yet".   Pt remains on TPN- receiving at 70 ml/hr, which provides 1737 kcals and 94 grams protein, meeting 100% of estimated kcal and protein needs.   Per palliative care notes, plan to have family meeting with pt son on Sunday, 09/02/19, to discuss goals  of care.   Medications reviewed and include lactulose and creon.  Lab Results  Component Value Date   HGBA1C 4.5 (L) 08/18/2019   PTA DM medications are .   Labs reviewed: CBGS: 131-148 (inpatient orders for glycemic control are 0-9 units insulin aspart every 8 hours).   Diet Order:   Diet Order            DIET - DYS 1 Room service appropriate? No; Fluid consistency: Thin  Diet effective now              EDUCATION NEEDS:   Education needs have been addressed  Skin:  Skin Assessment: Reviewed RN Assessment  Last BM:  08/29/19  Height:   Ht Readings from Last 1 Encounters:  08/12/2019 5' 6" (1.676 m)    Weight:   Wt Readings from Last 1 Encounters:  08/28/19 66.4 kg    Ideal Body Weight:  51.7 kg(adjusted quadriplegia)  BMI:  Body mass index is 23.63 kg/m.  Estimated Nutritional Needs:   Kcal:  1600-1800 kcal  Protein:  80-95 grams  Fluid:  >/= 1.6 L/day     W, RD, LDN, CDCES Registered Dietitian II Certified Diabetes Care and Education Specialist Please refer to [facility] for RD and/or RD on-call/weekend/after hours pager 

## 2019-08-30 NOTE — Progress Notes (Addendum)
Enema has been given and patient pushed out about half out. MD made aware. Will see if there is any success with what stayed in.   0531: Pt had a large BM. Was able to perform mouth care and was able to eat a few bites of her food last night. Patient was much more talkative than she has been and was able to have small talks with staff.

## 2019-08-30 NOTE — Plan of Care (Signed)
?  Problem: Coping: ?Goal: Level of anxiety will decrease ?Outcome: Progressing ?  ?Problem: Safety: ?Goal: Ability to remain free from injury will improve ?Outcome: Progressing ?  ?

## 2019-08-30 NOTE — Progress Notes (Signed)
Patient ID: Catherine Butler, female   DOB: 17-Jun-1961, 59 y.o.   MRN: [id number]  This NP visited patient at the bedside as a follow up for palliative medicine needs and emotional support.  Patient remains minimally responsive, unable to follow commands and appears generally uncomfortable.  Placed call and spoke to son/Josh Dundee regarding current medical situation; diagnosis, prognosis, goals of care, end-of-life wishes, disposition and options.  I shared with son the patient's high risk for further decompensation.  I detailed the difference between an aggressive medical intervention path and a palliative comfort path for this patient at this time in this situation.  Family face treatment option decisions advanced directive decisison and anticipatory care needs   Discussed with son the importance of continued conversation the   medical providers regarding overall plan of care and treatment options,  ensuring decisions are within the context of the patients values and GOCs.  For now son wishes to continue with current medical interventions over the next 2 days and hope for improvement.    Meeting is set for Sunday at 2:00 pm with this NP and son/Josh , for continued conversation regarding goals of care.   Questions and concerns addressed   Emotional support offered  Total time spent on the unit was 25 minutes  Greater than 50% of the time was spent in counseling and coordination of care  Lorinda Creed NP  Palliative Medicine Team Team Phone # [phone] Pager [phone]

## 2019-08-30 NOTE — Progress Notes (Signed)
Spoke to providers while they were rounding on pt this morning. Explained that pt has been irritable and refusing all oral medications after multiple attempts. Mentioned pt's HR still in 110's.

## 2019-08-30 NOTE — Progress Notes (Signed)
2 attempts made to place NG tube. Pt try to pull out 1st NG tube after successful placement. KUB confirmed tubing needed to be advanced 15cm-20cm. When attempting to advance NG tube, pt became increasingly combative. NG tube then removed, and SWOT nurse called to place 2nd NG tube. SWOT nurse Lorene Dy attempted to place 2nd NG tube. During 2nd attempt, pt became agitated and combative, NG tube removed and vital signs taken. Provider notified of unsuccessful attempts.

## 2019-08-30 NOTE — Plan of Care (Signed)
Overnight patient was not able to take PO medications due to being lethargic. MD made aware.  Patient did allow RN to perform mouth care. Throughout night patient began to become more alert and speaking with staff. Some phrases hard to understand. Patient began to become restless. RN asked if she was having any pain she said yes and stated that it was everywhere. PRN pain medication given. Currently resting.     Problem: Education: Goal: Knowledge of General Education information will improve Description: Including pain rating scale, medication(s)/side effects and non-pharmacologic comfort measures Outcome: Not Progressing   Problem: Clinical Measurements: Goal: Will remain free from infection Outcome: Progressing   Problem: Activity: Goal: Risk for activity intolerance will decrease Outcome: Not Progressing   Problem: Nutrition: Goal: Adequate nutrition will be maintained Outcome: Not Progressing   Problem: Safety: Goal: Ability to remain free from injury will improve Outcome: Progressing   Problem: Skin Integrity: Goal: Risk for impaired skin integrity will decrease Outcome: Progressing

## 2019-08-31 DIAGNOSIS — K7290 Hepatic failure, unspecified without coma: Secondary | ICD-10-CM

## 2019-08-31 DIAGNOSIS — R1310 Dysphagia, unspecified: Secondary | ICD-10-CM

## 2019-08-31 DIAGNOSIS — K7460 Unspecified cirrhosis of liver: Secondary | ICD-10-CM

## 2019-08-31 LAB — COMPREHENSIVE METABOLIC PANEL
ALT: 32 U/L (ref 0–44)
AST: 28 U/L (ref 15–41)
Albumin: 1.4 g/dL — ABNORMAL LOW (ref 3.5–5.0)
Alkaline Phosphatase: 178 U/L — ABNORMAL HIGH (ref 38–126)
Anion gap: 7 (ref 5–15)
BUN: 23 mg/dL — ABNORMAL HIGH (ref 6–20)
CO2: 28 mmol/L (ref 22–32)
Calcium: 8.4 mg/dL — ABNORMAL LOW (ref 8.9–10.3)
Chloride: 112 mmol/L — ABNORMAL HIGH (ref 98–111)
Creatinine, Ser: 0.31 mg/dL — ABNORMAL LOW (ref 0.44–1.00)
GFR calc Af Amer: >60 mL/min (ref >60)
GFR calc non Af Amer: >60 mL/min (ref >60)
Glucose, Bld: 136 mg/dL — ABNORMAL HIGH (ref 70–99)
Potassium: 3.9 mmol/L (ref 3.5–5.1)
Sodium: 147 mmol/L — ABNORMAL HIGH (ref 135–145)
Total Bilirubin: 0.6 mg/dL (ref 0.3–1.2)
Total Protein: 5.7 g/dL — ABNORMAL LOW (ref 6.5–8.1)

## 2019-08-31 LAB — TYPE AND SCREEN
ABO/RH(D): A POS
Antibody Screen: NEGATIVE
Unit division: 0

## 2019-08-31 LAB — BPAM RBC
Blood Product Expiration Date: 202104102359
ISSUE DATE / TIME: 202103110925
Unit Type and Rh: 6200

## 2019-08-31 LAB — CBC
HCT: 23.8 % — ABNORMAL LOW (ref 36.0–46.0)
Hemoglobin: 7.3 g/dL — ABNORMAL LOW (ref 12.0–15.0)
MCH: 33.5 pg (ref 26.0–34.0)
MCHC: 30.7 g/dL (ref 30.0–36.0)
MCV: 109.2 fL — ABNORMAL HIGH (ref 80.0–100.0)
Platelets: 158 10*3/uL (ref 150–400)
RBC: 2.18 MIL/uL — ABNORMAL LOW (ref 3.87–5.11)
RDW: 16.1 % — ABNORMAL HIGH (ref 11.5–15.5)
WBC: 13.6 10*3/uL — ABNORMAL HIGH (ref 4.0–10.5)
nRBC: 0 % (ref 0.0–0.2)

## 2019-08-31 LAB — CBC WITH DIFFERENTIAL/PLATELET
Abs Immature Granulocytes: 0.54 10*3/uL — ABNORMAL HIGH (ref 0.00–0.07)
Basophils Absolute: 0.1 10*3/uL (ref 0.0–0.1)
Basophils Relative: 1 %
Eosinophils Absolute: 0.1 10*3/uL (ref 0.0–0.5)
Eosinophils Relative: 1 %
HCT: 24.3 % — ABNORMAL LOW (ref 36.0–46.0)
Hemoglobin: 7.3 g/dL — ABNORMAL LOW (ref 12.0–15.0)
Immature Granulocytes: 5 %
Lymphocytes Relative: 10 %
Lymphs Abs: 1.1 10*3/uL (ref 0.7–4.0)
MCH: 33.8 pg (ref 26.0–34.0)
MCHC: 30 g/dL (ref 30.0–36.0)
MCV: 112.5 fL — ABNORMAL HIGH (ref 80.0–100.0)
Monocytes Absolute: 0.7 10*3/uL (ref 0.1–1.0)
Monocytes Relative: 6 %
Neutro Abs: 8.9 10*3/uL — ABNORMAL HIGH (ref 1.7–7.7)
Neutrophils Relative %: 77 %
Platelets: 166 10*3/uL (ref 150–400)
RBC: 2.16 MIL/uL — ABNORMAL LOW (ref 3.87–5.11)
RDW: 16.3 % — ABNORMAL HIGH (ref 11.5–15.5)
WBC: 11.4 10*3/uL — ABNORMAL HIGH (ref 4.0–10.5)
nRBC: 0.2 % (ref 0.0–0.2)

## 2019-08-31 LAB — MAGNESIUM: Magnesium: 1.9 mg/dL (ref 1.7–2.4)

## 2019-08-31 LAB — PHOSPHORUS: Phosphorus: 6.2 mg/dL — ABNORMAL HIGH (ref 2.5–4.6)

## 2019-08-31 LAB — GLUCOSE, CAPILLARY
Glucose-Capillary: 142 mg/dL — ABNORMAL HIGH (ref 70–99)
Glucose-Capillary: 133 mg/dL — ABNORMAL HIGH (ref 70–99)

## 2019-08-31 MED ORDER — TRAVASOL 10 % IV SOLN
INTRAVENOUS | Status: DC
  Filled 2019-08-31: qty 940.8

## 2019-08-31 NOTE — Progress Notes (Signed)
  Speech Language Pathology Treatment: Dysphagia  Patient Details Name: Catherine Butler MRN: [id number] DOB: 22-Apr-1961 Today's Date: 08/31/2019 Time: 7017-7939 SLP Time Calculation (min) (ACUTE ONLY): 17 min  Assessment / Plan / Recommendation Clinical Impression  Pt encountered laying diagonally in bed with NT in room, repositioning upright. Pt observed with breakfast tray (Dys1/thin). Pt was offered x1 bite of puree, orally accepted and ultimately spit food out. Pt stated she did not like anything on breakfast tray, and would not accept any other food. Following sips of thin liquids, pt was noted with immediate throat clearing and delayed cough x1. Pt required Max cues to utilize straw, but no s/sx of aspiration were noted following. Pt continues to refuse most foods and liquids offered, making it difficult to receive adequate nutrition orally. Given her current mentation, she is not ready for PO upgrade, remaining appropriate for her current diet. Will continue to follow.   HPI HPI: 59 y.o. female  with past medical history of COPD, guillain-barre/AIDP w/ chronic weakness, HTN, and ETOH use admitted on 09/05/19 with abdominal pain, nausea, and vomiting. She was found to be severely dehydrated with multiple severe electrolyte abnormalities. Being treated for pneumonitis vs aspiration pna and COPD exacerbation. Patient  ETOH use with significant esophagitis/gastritis.  Palliative following for GOC. Patient known to ST service, participated in MBSS 08/10/19: grossly normal swallow function. However, MBSS revealed esophageal retention and assessment of the esophageal phase was recommended.  CXR 08/22/19: Stable diffuse right lung opacities concerning for pneumonia.      SLP Plan  Continue with current plan of care       Recommendations  Diet recommendations: Thin liquid;Dysphagia 1 (puree) Liquids provided via: Cup;Straw Medication Administration: Crushed with puree Supervision: Staff to assist  with self feeding;Full supervision/cueing for compensatory strategies Compensations: Minimize environmental distractions;Slow rate;Small sips/bites;Follow solids with liquid Postural Changes and/or Swallow Maneuvers: Seated upright 90 degrees;Upright 30-60 min after meal                Oral Care Recommendations: Oral care QID;Staff/trained caregiver to provide oral care Follow up Recommendations: Skilled Nursing facility SLP Visit Diagnosis: Dysphagia, unspecified (R13.10) Plan: Continue with current plan of care       GO              Maudry Mayhew, Student SLP Office: [phone]  08/31/2019, 9:14 AM

## 2019-08-31 NOTE — Plan of Care (Signed)
Paged regarding Ms. Dombrosky's plan of care as nursing reports patient appears to be declining.  Today's Vitals   08/31/19 1410 08/31/19 1600 08/31/19 1832 08/31/19 2017  BP: 112/65  120/84 (!) 82/58  Pulse: (!) 116  (!) 124 (!) 114  Resp: (!) 24  (!) 23 20  Temp: 97.6 F (36.4 C)  97.6 F (36.4 C) 98.5 F (36.9 C)  TempSrc: Axillary  Axillary Axillary  SpO2: 91%  92% 90%  Weight:      Height:      PainSc:  Asleep     Body mass index is 23.63 kg/m.  Went to evaluate Ms. Treto who was minimally responsive to sternal rub with grunting. She appeared comfortable, however.  Plan is to continue with current plan of care including all scheduled medications and interventions but to avoid escalation of care. If the patient were to become uncomfortable as she declines, we would then transition fully to comfort care.  Jenell Milliner, MD 08/31/2019, 8:55 PM Pager: [phone]

## 2019-08-31 NOTE — Progress Notes (Addendum)
Physical Therapy Treatment Patient Details Name: Catherine Butler MRN: [id number] DOB: 25-Mar-1961 Today's Date: 08/31/2019    History of Present Illness Pt is a 59 year old female with PMH for COPD, HTN, AIDP, recent hospital admission for colitis (2/14-2/19) who presented on 07/25/2019 with abdominal pain, nausea, and vomiting. Pt admitted secondary to enteritis of uncertain etiology complicated by ascites/anasarca and significant protein-caloric malnutrition.    PT Comments    Pt significantly limited this session secondary to poor arousal level, restlessness, cognition/AMS, increasing RR, decreasing SPo2 on 4L of O2 and labored breathing. Pt's RN and nurse tech present upon arrival and aware. Pt required max A x2 for bed mobility and rolling bilaterally for pericare. She was unable to follow any commands and only briefly maintaining eyes open (2-3 seconds) 25% of the time. Of note, pt on 4L of O2 throughout with SPO2 decreasing to 86% with slow increase to 92% with repositioning in bed (HOB at 30 degrees). HR maintaining 110-121 bpm throughout.   Pt would continue to benefit from skilled physical therapy services at this time while admitted and after d/c to address the below listed limitations in order to improve overall safety and independence with functional mobility.    Follow Up Recommendations  SNF;Supervision/Assistance - 24 hour     Equipment Recommendations  Other (comment)(defer to next venue of care)    Recommendations for Other Services       Precautions / Restrictions Precautions Precautions: Fall Precaution Comments: monitor HR and SPo2 Restrictions Weight Bearing Restrictions: No    Mobility  Bed Mobility Overal bed mobility: Needs Assistance Bed Mobility: Rolling Rolling: Max assist;+2 for physical assistance         General bed mobility comments: max A to roll bilaterally for pericare and to exchange bed linens  Transfers                 General  transfer comment: further mobility deferred secondary to pt with increased RR, labored breathing, grunting, decreasing SPo2 even on 4L of O2 and increasing HR (RN present and aware)  Ambulation/Gait                 Stairs             Wheelchair Mobility    Modified Rankin (Stroke Patients Only)       Balance                                            Cognition Arousal/Alertness: Lethargic Behavior During Therapy: Flat affect;Restless Overall Cognitive Status: Difficult to assess Area of Impairment: Attention;Following commands                   Current Attention Level: Focused   Following Commands: Follows one step commands inconsistently              Exercises      General Comments        Pertinent Vitals/Pain Pain Assessment: Faces Faces Pain Scale: Hurts even more Pain Location: generalized, grimacing, moaning, labored breathing Pain Descriptors / Indicators: Grimacing Pain Intervention(s): Monitored during session;Repositioned    Home Living                      Prior Function            PT Goals (current goals can now be found in the  care plan section) Acute Rehab PT Goals PT Goal Formulation: With patient Time For Goal Achievement: 09/14/19 Potential to Achieve Goals: Fair Progress towards PT goals: Not progressing toward goals - comment(cognition, RR, labored breathing, O2 needs)    Frequency    Min 2X/week      PT Plan Current plan remains appropriate    Co-evaluation              AM-PAC PT "6 Clicks" Mobility   Outcome Measure  Help needed turning from your back to your side while in a flat bed without using bedrails?: Total Help needed moving from lying on your back to sitting on the side of a flat bed without using bedrails?: Total Help needed moving to and from a bed to a chair (including a wheelchair)?: Total Help needed standing up from a chair using your arms (e.g.,  wheelchair or bedside chair)?: Total Help needed to walk in hospital room?: Total Help needed climbing 3-5 steps with a railing? : Total 6 Click Score: 6    End of Session Equipment Utilized During Treatment: Oxygen(4L) Activity Tolerance: Patient limited by fatigue;Patient limited by lethargy;Treatment limited secondary to medical complications (Comment)(labored breathing, decreasing SPo2 on 4L) Patient left: in bed;with call bell/phone within reach;with bed alarm set;Other (comment)(mittens reapplied) Nurse Communication: Mobility status PT Visit Diagnosis: Muscle weakness (generalized) (M62.81);Pain Pain - part of body: (generalized)     Time: 4259-5638 PT Time Calculation (min) (ACUTE ONLY): 21 min  Charges:  $Therapeutic Activity: 8-22 mins                     Arletta Bale, DPT  Acute Rehabilitation Services Pager [phone] Office [phone]     Alessandra Bevels Raygan Skarda 08/31/2019, 1:41 PM

## 2019-08-31 NOTE — Progress Notes (Signed)
Dr. Frances Furbish paged and called back regarding medication orders/pain control, patient continued decrease of interaction.  Stated would be up before end of shift to evaluate patient.

## 2019-08-31 NOTE — Plan of Care (Signed)
  Problem: Education: Goal: Knowledge of General Education information will improve Description: Including pain rating scale, medication(s)/side effects and non-pharmacologic comfort measures Outcome: Not Progressing   Problem: Clinical Measurements: Goal: Will remain free from infection Outcome: Progressing   Problem: Activity: Goal: Risk for activity intolerance will decrease Outcome: Not Progressing   Problem: Nutrition: Goal: Adequate nutrition will be maintained Outcome: Progressing   Problem: Safety: Goal: Ability to remain free from injury will improve Outcome: Progressing   Problem: Skin Integrity: Goal: Risk for impaired skin integrity will decrease Outcome: Progressing

## 2019-08-31 NOTE — Procedures (Signed)
Cortrak  Tube Type:  Cortrak - 43 inches Tube Location:  Left nare Initial Placement:  Postpyloric Secured by: Bridle Technique Used to Measure Tube Placement:  Documented cm marking at nare/ corner of mouth Cortrak Secured At:  70 cm Procedure Comments:  Tube just inside pylorus   Cortrak Tube Team Note:  Consult received to place a Cortrak feeding tube.   No x-ray is required. RN may begin using tube.  .   If the tube becomes dislodged please keep the tube and contact the Cortrak team at [email or web address] (password [email or web address]) for replacement.  If after hours and replacement cannot be delayed, place a NG tube and confirm placement with an abdominal x-ray.    Betsey Holiday MS, RD, LDN Contact information available in Amion

## 2019-08-31 NOTE — Progress Notes (Signed)
Subjective: HD#16 Events Overnight: NG tube placement yesterday remove the patient subsequently removed.  Another attempt was made that was unsuccessful.  Patient did not tolerate the lactulose enemas well. Had a large BM this morning.   Patient was seen this morning on rounds.  Patient seem to be more alert and oriented today although she is still very confused.  She was able to have a large bowel movement which did seem to improve her mentation.  She was not able to participate with interviewing otherwise.  Objective:  Vital signs in last 24 hours: Vitals:   08/30/19 2044 08/30/19 2215 08/31/19 0223 08/31/19 0603  BP: (!) 119/97 105/82 130/87 118/84  Pulse: (!) 115 (!) 116 (!) 107 (!) 117  Resp: 20 20 20 20   Temp: (!) 97.4 F (36.3 C) (!) 97.2 F (36.2 C) 98.4 F (36.9 C) (!) 97.5 F (36.4 C)  TempSrc: Oral Oral  Oral  SpO2: 95%  93% 100%  Weight:      Height:       Supplemental O2: 5L   Physical Exam: Physical Exam  Constitutional: She appears malnourished. She appears unhealthy. She appears cachectic.  HENT:  Head: Normocephalic and atraumatic.  Cardiovascular: Intact distal pulses.  Pulmonary/Chest: She has wheezes.  Abdominal: Soft. She exhibits no distension. There is no abdominal tenderness.  Musculoskeletal:        General: No edema. Normal range of motion.     Cervical back: Normal range of motion.  Neurological: She is alert.  Skin: Skin is warm and dry. She is not diaphoretic.    Filed Weights   08/13/2019 2336 28-Aug-2019 1656 08/28/19 0300  Weight: 52.2 kg 61.1 kg 66.4 kg     Intake/Output Summary (Last 24 hours) at 08/31/2019 0621 Last data filed at 08/31/2019 0531 Gross per 24 hour  Intake 906.33 ml  Output 1000 ml  Net -93.67 ml    Risk Score:  N/A  Pertinent labs/Imaging: CBC Latest Ref Rng & Units 08/31/2019 08/30/2019 08/30/2019  WBC 4.0 - 10.5 K/uL 13.6(H) - 12.8(H)  Hemoglobin 12.0 - 15.0 g/dL 7.3(L) 8.2(L) 6.7(LL)  Hematocrit 36.0 - 46.0  % 23.8(L) 26.4(L) 22.4(L)  Platelets 150 - 400 K/uL 158 - 166    CMP Latest Ref Rng & Units 08/30/2019 08/29/2019 08/28/2019  Glucose 70 - 99 mg/dL 10/28/2019) 161(W) 960(A)  BUN 6 - 20 mg/dL 540(J) 81(X) 91(Y)  Creatinine 0.44 - 1.00 mg/dL 78(G) 9.56(O 1.30  Sodium 135 - 145 mmol/L 145 143 141  Potassium 3.5 - 5.1 mmol/L 3.9 4.3 4.4  Chloride 98 - 111 mmol/L 113(H) 110 108  CO2 22 - 32 mmol/L 26 23 24   Calcium 8.9 - 10.3 mg/dL 8.3(L) 8.6(L) 8.4(L)  Total Protein 6.5 - 8.1 g/dL 8.65) 6.4(L) 5.6(L)  Total Bilirubin 0.3 - 1.2 mg/dL 0.5 0.7 0.8  Alkaline Phos 38 - 126 U/L 156(H) 164(H) 136(H)  AST 15 - 41 U/L 24 32 26  ALT 0 - 44 U/L 27 33 29    DG Abd 1 View  Result Date: 08/30/2019 CLINICAL DATA:  NG tube. EXAM: ABDOMEN - 1 VIEW COMPARISON:  None. FINDINGS: Tip of the nasogastric tube terminates at the level of the gastroesophageal junction, with proximal side holes at the level of the mid to lower esophagus. IMPRESSION: Tip of the nasogastric tube is within the lower esophagus. Recommend advancing 15-20 cm for optimal radiographic positioning within the stomach. Electronically Signed   By: 7.8(I M.D.   On: 08/30/2019 15:42  Assessment/Plan:  Principal Problem:   Protein-calorie malnutrition, severe Active Problems:   Urinary retention   Nausea with vomiting   Colitis   Hematochezia   Ascites   Hypoalbuminemia   Enteritis   Generalized abdominal pain   Gastritis and gastroduodenitis   Acute esophagitis   Encephalopathy   Aspiration into airway   Dyspnea   Adult failure to thrive   Palliative care by specialist   Goals of care, counseling/discussion   DNR (do not resuscitate)   Palliative care encounter    Patient Summary: Catherine Butler is a 59 y.o. with pertinent PMH of COPD and HTN who presented with  nausea and vomiting  and admit for enteritis of uncertain etiology complicated byascites/anasarca and significant protein-caloric malnutrition with concern  hepatic encephalopathy  on hospital day 16   #Hypoxemia with respiratory distress, multifactorial Continue thiamine to 250 mg, tomorrow's transition to 100 mg TID for 1 -2 weeks and the 100 mg orally daily thereafter.  -Continue supplemental oxygen nasal cannula as needed. -If respiration status declines perform ABG and try BiPAP if possible  #COPD exacerbation -Continue nebulizer and supplemental oxygen as needed  #Cirrhosis: #Concern for hepatic encephalopathy  Patient was not able to tolerate NG tube placement last night and needed lactulose enema with good response.  She is apparently more alert after her first large bowel movement.  We will redose lactulose enemas today.  If patient is unable to tolerate this then we will need to proceed to core track placement with actual [place] via NG tube.  Continued goal of 2-3 bowel movements daily.   -Lactulose enemas 3 times daily -If unsuccessful we will try core track placement of NG tube with lactulose via tube -Soft restraints only if necessary -Can give Ativan 0.5 mg as needed for enema/tube placement.  #Enteritis,unknown etiologyvs.Chronic pancreatitis #Anasarca -Continue TPN and Creon when eating  Diet: TPN IVF: None,None VTE: SCDs Code: DNR PT/OT recs: Pending TOC recs: Pending   Dispo: Anticipated discharge pending clinical improvement.    Marianna Payment, D.O. [facility], PGY-1 Date 08/31/2019 Time 6:21 AM

## 2019-08-31 NOTE — Progress Notes (Signed)
IM resident paged, patient has continued to decline, respirations remain the same, patient sleeping most of day, wakes up to verbal but goes right back to sleep, medicated for pain and appears to relax patient and HR dropped to 1teens, sats remains in 92-93%, patient appears more comfortable overall.

## 2019-08-31 NOTE — Progress Notes (Signed)
PHARMACY - TOTAL PARENTERAL NUTRITION CONSULT NOTE   Indication: Severe esophagitis   Patient Measurements: Height: '5\' 6"'$  (167.6 cm) Weight: 146 lb 6.2 oz (66.4 kg) IBW/kg (Calculated) : 59.3 TPN AdjBW (KG): 52.2 Body mass index is 23.63 kg/m. Usual Weight: 61 kg  Assessment: 59 year old female with PMH significant for COPD, HTN, [place], quadriplegia, ETOH abuse, and recent admission for colitis now presenting with ascites and colitis on 08/01/2019. Patient has not been eating well since discharge a week prior and feeling that food has been getting stuck in her throat leading to vomiting. Patient has severe muscle depletion and poor po intake due to loss of appetite for >1 month.   Glucose / Insulin: CBG controlled. 3 units of sSSI utilized in last 24 hours. Off steroids. Electrolytes: Na 145>147, CO2 WNL / CL 112, Phos 2.6 yesterday and given KPhos 62mol - level today of 6.2 appears to be contaminant, Mg 1.9, CoCa 10.5 (none in TPN) Renal: SCr stable at 0.31 (monitoring closely with quadriplegia), BUN 23 (trend down) LFTs / TGs: LFTs / Tbili WNL, Alk Phos trend up to 178. TG 165 Prealbumin / albumin: Pre-albumin trend up to 13.6; Albumin 1.4 Intake / Output; MIVF: UOP 0.3 mL/kg/hr per documentation, unsuccessful NGT placement overnight; LBM 3/12 after lactulose enema > patient now much more alert per RN. Anasarca noted on CT scan.  GI Imaging: 08/15/19 CT Abdomen - moderate bilateral pleural effusions, esophagitis and hiatal hernia, large volume ascites 08/22/19 CT Abdomen - PNA + increased bilateral effusions and anasarca, advanced fatty infiltration of liver suggestive of early cirrhosis, thickened small bowel loops from ascites and underlying liver disease- cannot exclude enteritis, no bowel obstruction Surgeries / Procedures:  08/15/19 - L paracentesis with 900 mL drained 08/07/2019 - EGD: severe esophagitis, 7 cm hiatal hernia, portal hypertensive gastropathy, moderate gastritis, two  duodenal polyps which were resected.   Central access: PICC, 08/17/19 TPN start date: 08/17/19  Nutritional Goals (per RD recommendation on 08/30/19): KCal: 1600-1800, Protein: 80-95, Fluid: >= 1.6 L/ day Goal TPN rate is 70 mL/hr (provides 94 g of protein and 1737 kcals per day)  Current Nutrition:  TPN Dys 1 diet - 0% intake, continued abdominal pain, poor appetite and very confused Ensure Enlive TID - none charted  Plan:  Continue TPN at goal rate 70 mL/hr. Electrolytes in TPN: 465m/L of Na, 5026mL of K, remove Ca, 69m67m of Mg, and 15mm43m of Phos (will keep same for today with high level appearing to be contaminant, f/u repeat labs in AM). Cl:Ac ratio 1:2 Due to lethargy and inability to take oral medications at times this admit, discontinue PO MVI and place in TPN MWF only due to national shortage.  Will add back trace elements to TPN since discontinuing oral MVI.  Add Thiamine back to the TPN on 3/13 when high dose therapy complete. Continue folic acid in TPN.  Continue Sensitive q8h SSI and adjust as needed - may be able to d/c soon if CBGs controlled and usage remains low Monitor AM labs  Follow-up ability to take PO intake versus Cortrak placement, GOC discussions   HaleyArturo MortonrmD, BCPS Please check AMION for all [facility]act numbers Clinical Pharmacist 08/31/2019 8:09 AM

## 2019-09-01 ENCOUNTER — Inpatient Hospital Stay (HOSPITAL_COMMUNITY): Payer: Medicaid Other

## 2019-09-01 DIAGNOSIS — I469 Cardiac arrest, cause unspecified: Secondary | ICD-10-CM

## 2019-09-01 DIAGNOSIS — E870 Hyperosmolality and hypernatremia: Secondary | ICD-10-CM

## 2019-09-01 DIAGNOSIS — J9601 Acute respiratory failure with hypoxia: Secondary | ICD-10-CM

## 2019-09-01 LAB — GLUCOSE, CAPILLARY
Glucose-Capillary: 138 mg/dL — ABNORMAL HIGH (ref 70–99)
Glucose-Capillary: 142 mg/dL — ABNORMAL HIGH (ref 70–99)

## 2019-09-01 LAB — COMPREHENSIVE METABOLIC PANEL
ALT: 25 U/L (ref 0–44)
AST: 16 U/L (ref 15–41)
Albumin: 1.4 g/dL — ABNORMAL LOW (ref 3.5–5.0)
Alkaline Phosphatase: 162 U/L — ABNORMAL HIGH (ref 38–126)
Anion gap: 6 (ref 5–15)
BUN: 22 mg/dL — ABNORMAL HIGH (ref 6–20)
CO2: 31 mmol/L (ref 22–32)
Calcium: 8.4 mg/dL — ABNORMAL LOW (ref 8.9–10.3)
Chloride: 111 mmol/L (ref 98–111)
Creatinine, Ser: 0.43 mg/dL — ABNORMAL LOW (ref 0.44–1.00)
GFR calc Af Amer: >60 mL/min (ref >60)
GFR calc non Af Amer: >60 mL/min (ref >60)
Glucose, Bld: 136 mg/dL — ABNORMAL HIGH (ref 70–99)
Potassium: 3.9 mmol/L (ref 3.5–5.1)
Sodium: 148 mmol/L — ABNORMAL HIGH (ref 135–145)
Total Bilirubin: 0.5 mg/dL (ref 0.3–1.2)
Total Protein: 5.4 g/dL — ABNORMAL LOW (ref 6.5–8.1)

## 2019-09-01 LAB — MAGNESIUM: Magnesium: 2.2 mg/dL (ref 1.7–2.4)

## 2019-09-01 LAB — CBC
HCT: 24.3 % — ABNORMAL LOW (ref 36.0–46.0)
Hemoglobin: 7.3 g/dL — ABNORMAL LOW (ref 12.0–15.0)
MCH: 33.6 pg (ref 26.0–34.0)
MCHC: 30 g/dL (ref 30.0–36.0)
MCV: 112 fL — ABNORMAL HIGH (ref 80.0–100.0)
Platelets: 183 10*3/uL (ref 150–400)
RBC: 2.17 MIL/uL — ABNORMAL LOW (ref 3.87–5.11)
RDW: 15.9 % — ABNORMAL HIGH (ref 11.5–15.5)
WBC: 16.4 10*3/uL — ABNORMAL HIGH (ref 4.0–10.5)
nRBC: 0.4 % — ABNORMAL HIGH (ref 0.0–0.2)

## 2019-09-01 LAB — PHOSPHORUS: Phosphorus: 3 mg/dL (ref 2.5–4.6)

## 2019-09-01 MED ORDER — LACTULOSE 10 GM/15ML PO SOLN
20.0000 g | Freq: Three times a day (TID) | ORAL | Status: DC
  Filled 2019-09-01: qty 30

## 2019-09-01 MED ORDER — SODIUM CHLORIDE 0.9 % IV SOLN
2.0000 g | Freq: Every day | INTRAVENOUS | Status: DC
  Administered 2019-09-01: 2 g via INTRAVENOUS
  Filled 2019-09-01: qty 20

## 2019-09-01 MED ORDER — DEXTROSE 5 % IV SOLN: INTRAVENOUS | Status: DC

## 2019-09-01 MED ORDER — THIAMINE HCL 100 MG PO TABS
50.0000 mg | ORAL_TABLET | Freq: Every day | ORAL | Status: DC
  Filled 2019-09-01: qty 1

## 2019-09-06 LAB — VITAMIN C: Vitamin C: 0.1 mg/dL — ABNORMAL LOW (ref 0.4–2.0)

## 2019-09-20 NOTE — Progress Notes (Addendum)
At 08:30 Pt was lethargic, moved arms with tactile stimuli, MD aware of Pt's current status. At 09:30 Pt unresponsive, irregular breathing noted, charge RN called to room, Pt on agonal breathing, secretions coming out, mouth suctioned. Noted Pt is DNR. At 10:01, breathing stopped, no pulse noted, 2nd RN verified that Pt had no respiration and no pulse, Dr. Dortha Schwalbe notified. MD came in at 10:15. Sharia Reeve (son) notified, Pt's son is coming to see her.  [employer] notified, son came to bedside at !3:00, belongings surrendered to son. Family had not decided on funeral home yet, gave phone number to call once they have info on funeral home. Post mortem care done, transferred to morgue.

## 2019-09-20 NOTE — Progress Notes (Addendum)
Subjective: HD#17   Overnight: She was evaluated by the night float team and was found to be nonresponsive to sternal rubs but did appear comfortable in bed.  Regards to her vitals, BP was 95/67, SPO2 90% on 5 L nasal cannula, tachycardic to 115 and afebrile.  Today, Catherine Butler remains encephalopathic but responds to sternal rub. Nursing staff reports that she had a large bowel movement yesterday.  See Assessment and Plan for further update and unfortunate events that transpired  Objective:  Vital signs in last 24 hours: Vitals:   08/31/19 2017 08/31/19 2240 08/29/2019 0028 09/14/2019 0338  BP: (!) 82/58 95/67 107/82 112/82  Pulse: (!) 114 (!) 115 (!) 115 (!) 118  Resp: 20 20    Temp: 98.5 F (36.9 C) 98.4 F (36.9 C) 98.2 F (36.8 C) 98.4 F (36.9 C)  TempSrc: Axillary Oral Oral Axillary  SpO2: 90% 90% 94% 94%  Weight:      Height:       Const: Encephalopathic, responds to sternal rub HEENT: CoreTrak in place CV: Tachycardic  Abd: Non-distended, no guarding, NTTP Ext: 1-2+ pitting edema at the lower extremities  Skin: No sacral ulcers, RUE skin bruise at the prior IV site.    Assessment/Plan:  Principal Problem:   Protein-calorie malnutrition, severe Active Problems:   Urinary retention   Nausea with vomiting   Colitis   Hematochezia   Ascites   Hypoalbuminemia   Enteritis   Generalized abdominal pain   Gastritis and gastroduodenitis   Acute esophagitis   Encephalopathy   Aspiration into airway   Dyspnea   Adult failure to thrive   Palliative care by specialist   Goals of care, counseling/discussion   DNR (do not resuscitate)   Palliative care encounter  Catherine Butler is a 59 year old woman with medical history significant for COPD, hypertension, presumed cirrhosis who presented with nausea and vomiting currently being treated for enteritis, severe protein calorie malnutrition and hepatic encephalopathy  #Enteritis/gastritis #Severe protein calorie  malnutrition #Severe intestinal wall edema #Chronic pancreatitis #Anasarca #Failure to thrive #Hepatic encephalopathy #Leukocytosis of unclear etiology #Hypoxic respiratory failure #COPD #Hypernatremia #PEA arrest  Catherine Butler was examined this morning by the internal medicine rounding team and during our assessment, she was minimally responsive but would make nonsensical speech and only withdraw to painful stimuli and sternal rub.  In regards to her vitals, she was noted to be tachycardic to the 110s, afebrile, BP 119/78, SPO2 100% on 5 L nasal cannula.  In regards to her medical treatment, her albumin had remained at 1.4 which has slightly improved after we had started TPN.  She also had severe gut edema for which we wanted to initiate enteral feeds and had placed a core track on 09/06/2019.  Regarding her encephalopathy, She had a diagnosis of presumed cirrhosis and is status post paracentesis during the initial days of her hospitalization.  Her CMP is without transaminitis.  In regards to her encephalopathy, she has remained on IV thiamine supplementation due to prior concern for thiamine deficiency causing wet [place].  Fortunately, her thiamine level was unremarkable. CBC this morning showed worsening leukocytosis.  Today at 16<<11<<13.  She has remained afebrile.  Concern for either ongoing aspiration pneumonitis versus SBP. There is also concern for bacteremia/fungemia given that she is on TPN.  We had initiated ceftriaxone and had sent for blood cultures, chest x-ray and abdominal ultrasound which were performed this morning.  She has a history of COPD and hypoxic respiratory failure and  continued to remain on 5 L nasal cannula and was maintaining oxygen saturation in the 90s earlier.  She has been previously treated with Unasyn due to concern for aspiration pneumonia.  Her hypernatremia was managed with D5 water.  I received a page from the nurse that patient was unresponsive while she was  getting blood drawn.  I went up to evaluate her and on physical exams, her pupils were dilated, nonreactive to light.  She had no pulse or breath sounds.  On review of her telemetry monitor, it appeared that she quickly had an episode of bradycardia and went into PEA arrest and passed 10:01 AM on 10-01-19.  I was able to update Catherine Butler son Catherine Butler about his mom's death.   Catherine Rack, MD 01-Oct-2019, 6:23 AM Pager: [phone] Internal Medicine Teaching Service

## 2019-09-20 NOTE — Discharge Summary (Signed)
. Name: Catherine Butler MRN: [id number] DOB: 10-Jun-1961 59 y.o.  Date of Admission: 07/24/2019  8:48 PM Date of Discharge: 09/13/2019 Attending Physician:   Discharge Diagnosis: Principal Problem:   Protein-calorie malnutrition, severe Active Problems:   Urinary retention   Nausea with vomiting   Colitis   Hematochezia   Ascites   Hypoalbuminemia   Enteritis   Generalized abdominal pain   Gastritis and gastroduodenitis   Acute esophagitis   Encephalopathy   Aspiration into airway   Dyspnea   Adult failure to thrive   Palliative care by specialist   Goals of care, counseling/discussion   DNR (do not resuscitate)   Palliative care encounter  Cause of death: Severe protein calorie malnutrition in the setting of enteritis and new onset cirrhosis resulting in hypoxic respiratory distress and significant abdominal anasarca which led to PEA cardiac arrest. Time of death: 10:01am  Disposition and follow-up:   [name] was discharged from [facility] in expired condition.    Hospital Course: Was a 59 year old female with a pertinent past medical history of asthma, COPD, acute inflammatory demyelinating polyradiculoneuropathy, cirrhosis, chronic pancreatitis, who presented to [facility] on 08/06/2019 with a 3-week history of abdominal pain, nausea, vomiting that was associated with abdominal pain.  During these 3 weeks patient had progression of her nausea and vomiting with nonbilious, nonbloody emesis.  She does report roughly a 40 to 50 pound weight loss since July which she attributes to decreased appetite with early satiety.  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  Patient presented severely dehydrated with multiple severe electrolyte abnormalities including hypokalemia, hypomagnesemia, anion gap metabolic acidosis with starvation ketosis.  She was initially FOBT positive in the ED  but had no overt signs or symptoms of GI bleeding. Her albumin was severely reduced and at times undetectable with significant sarcopenia throughout her abdomen secondary to the severe protein calorie malnutrition.  Her nausea was difficult to manage due to her prolonged QT C interval at 510 ms corrected.  Therefore initial attempts at symptom management consists of IV promethazine, IV fluids, potassium and magnesium repletion.  GI was consulted and performed EGD which showed severe esophagitis/gastritis with a 7 cm hiatal hernia and she was started on PPI therapy. CT scan showed significant areas of bowel edema with enteritis and a new ascites/anasarca.  Severely low albumin levels meet the sac score difficult to interpret but likely secondary to portal hypertension versus severe hypoalbuminemia.  Liver function labs were not indicative of significant hepatocellular or cholestasis damage with imaging borderline for hepatic steatosis versus cirrhosis secondary to ETOH use disorder. She was kept n.p.o. for bowel rest in the setting of her significant anasarca and eventually switched to TPN for nutrition in an attempt to improve her albumin and malnutrition.  Patient's clinical course waxed and waned on 08/21/2018 on-call physicians were paged to the patient's room due to significant respiratory distress ABG showing significant respiratory alkalosis chest x-ray showed extensive airspace opacities throughout the right lung new from prior exam with bilateral pleural effusions.  Was concerning for aspiration pneumonitis versus pneumonia.  Patient was afebrile without a leukocytosis but was significantly tachypneic and tachycardic and requiring 2 to 4 L of oxygen to remain oxygen saturations above 90 at this time. She has given a 5 course of IV Unasyn. On exam she had significant upper lung field rhonchi with right-sided rales. She was given a 7-day course of methylprednisolone and  breathing treatments with minimal  improvement of her respiratory status.  IR performed right thoracentesis with only 450 cc of light yellow fluid removed.  Her effusion found to be transudate of with no organisms and negative cytology.  Conservative the patient's tenuous clinical status, palliative care was consulted  On 03/05 and the patient was switched to DNR.  Patient's son discussed that he would not like any invasive procedures performed and to switch to comfort care if no reversible causes can be found.  Patient's mental status began to fluctuate with intermittent periods of disorientation and confusion that progressed to overt encephalopathy.  Neurology was consulted due to concern for recurring AIDP.  Neurology believe this is secondary to AIDP and recommended checking for thiamine deficiency.  Patient was started on empiric treatment with thiamine and thiamine level was drawn that was within normal limits.  Patient only had minimal improvement of her mental status with continuation of her respiratory distress and tachycardia.  We for concerned for possible hepatic encephalopathy in setting of new onset cirrhosis secondary to EtOH use disorder.  Patient was started on lactulose therapy.  She was unable to tolerate it p.o., and therefore, it was initiated via lactulose and last.  We attempted to insert an NG tube for her lactulose therapy, but the patient was unable to tolerate this and subsequently removed her NG tube.  She showed minimal improvement with lactulose therapy despite having BM in response to the therapy.  Patient's clinical status continued to decline and on 2022-09-28 patient was found to be unresponsive with worsening respiratory status and bradycardia secondary to PEA arrest and ultimately passed away at 10: 01 a.m. on 2019/09/28.   Signed: Marianna Payment, MD 09/13/2019, 6:40 AM

## 2019-09-20 NOTE — Progress Notes (Signed)
Patient to Radiology

## 2019-09-20 DEATH — deceased
